# Patient Record
Sex: Female | Born: 1955
Health system: Southern US, Community
[De-identification: ages and names within clinical notes are randomized; demographics above are authoritative.]

## PROBLEM LIST (undated history)

## (undated) ENCOUNTER — Emergency Department (HOSPITAL_COMMUNITY): Discharge status: Against Medical Advice | Payer: Self-pay

## (undated) DIAGNOSIS — D638 Anemia in other chronic diseases classified elsewhere: Secondary | ICD-10-CM

## (undated) DIAGNOSIS — I10 Essential (primary) hypertension: Secondary | ICD-10-CM

## (undated) DIAGNOSIS — E119 Type 2 diabetes mellitus without complications: Secondary | ICD-10-CM

## (undated) DIAGNOSIS — E079 Disorder of thyroid, unspecified: Secondary | ICD-10-CM

## (undated) DIAGNOSIS — N2889 Other specified disorders of kidney and ureter: Secondary | ICD-10-CM

## (undated) DIAGNOSIS — R221 Localized swelling, mass and lump, neck: Secondary | ICD-10-CM

## (undated) HISTORY — PX: NO PAST SURGERIES: SHX2092

---

## 1999-09-24 ENCOUNTER — Inpatient Hospital Stay (HOSPITAL_COMMUNITY): Admission: EM | Admit: 1999-09-24 | Discharge: 1999-10-02 | Payer: Self-pay | Admitting: Emergency Medicine

## 1999-09-27 ENCOUNTER — Encounter: Payer: Self-pay | Admitting: Internal Medicine

## 1999-10-09 ENCOUNTER — Encounter: Admission: RE | Admit: 1999-10-09 | Discharge: 1999-10-09 | Payer: Self-pay | Admitting: Internal Medicine

## 2000-11-11 ENCOUNTER — Encounter: Payer: Self-pay | Admitting: Emergency Medicine

## 2000-11-11 ENCOUNTER — Emergency Department (HOSPITAL_COMMUNITY): Admission: EM | Admit: 2000-11-11 | Discharge: 2000-11-11 | Payer: Self-pay | Admitting: Emergency Medicine

## 2013-06-10 ENCOUNTER — Ambulatory Visit: Payer: Self-pay

## 2013-07-17 ENCOUNTER — Ambulatory Visit: Payer: Self-pay | Attending: Internal Medicine | Admitting: Internal Medicine

## 2013-07-17 ENCOUNTER — Encounter: Payer: Self-pay | Admitting: Internal Medicine

## 2013-07-17 VITALS — BP 111/88 | HR 70 | Temp 98.5°F | Resp 16 | Ht 65.0 in | Wt 247.0 lb

## 2013-07-17 DIAGNOSIS — F17200 Nicotine dependence, unspecified, uncomplicated: Secondary | ICD-10-CM

## 2013-07-17 DIAGNOSIS — E119 Type 2 diabetes mellitus without complications: Secondary | ICD-10-CM

## 2013-07-17 DIAGNOSIS — K029 Dental caries, unspecified: Secondary | ICD-10-CM

## 2013-07-17 DIAGNOSIS — Z139 Encounter for screening, unspecified: Secondary | ICD-10-CM

## 2013-07-17 LAB — POCT GLYCOSYLATED HEMOGLOBIN (HGB A1C): Hemoglobin A1C: 6.7

## 2013-07-17 LAB — GLUCOSE, POCT (MANUAL RESULT ENTRY): POC Glucose: 151 mg/dl — AB (ref 70–99)

## 2013-07-17 MED ORDER — NICOTINE 21 MG/24HR TD PT24: 21.0000 mg | MEDICATED_PATCH | Freq: Every day | TRANSDERMAL | Status: DC

## 2013-07-17 MED ORDER — FREESTYLE SYSTEM KIT: 1.0000 | PACK | Freq: Three times a day (TID) | Status: DC

## 2013-07-17 MED ORDER — METFORMIN HCL ER 500 MG PO TB24: 500.0000 mg | ORAL_TABLET | Freq: Every day | ORAL | Status: DC

## 2013-07-17 NOTE — Progress Notes (Signed)
MRN: [id number] Name: Ashley Ramsey  Sex: female Age: 57 y.o. DOB: Dec 21, 1955  Allergies: Review of patient's allergies indicates no known allergies.  Chief Complaint  Patient presents with  . Establish Care    HPI: Patient is 57 y.o. female who comes for the first time to establish medical care, she denies any acute symptoms, as per patient she has a strong family history of diabetes and would like to be checked, hemoglobin A1c in office today is 6.7%. She also smoked cigarettes advised to quit smoking she is agreeable to try nicotine patch, patient also mentioned that she was taking blood pressure medication in the past today her blood pressure is 111/88.  History reviewed. No pertinent past medical history.  History reviewed. No pertinent past surgical history.    Medication List       This list is accurate as of: 07/17/13 12:15 PM.  Always use your most recent med list.               metFORMIN 500 MG 24 hr tablet  Commonly known as:  GLUCOPHAGE XR  Take 1 tablet (500 mg total) by mouth daily with breakfast.     nicotine 21 mg/24hr patch  Commonly known as:  NICODERM CQ - dosed in mg/24 hours  Place 1 patch (21 mg total) onto the skin daily.        Meds ordered this encounter  Medications  . metFORMIN (GLUCOPHAGE XR) 500 MG 24 hr tablet    Sig: Take 1 tablet (500 mg total) by mouth daily with breakfast.    Dispense:  30 tablet    Refill:  3  . nicotine (NICODERM CQ - DOSED IN MG/24 HOURS) 21 mg/24hr patch    Sig: Place 1 patch (21 mg total) onto the skin daily.    Dispense:  28 patch    Refill:  0     There is no immunization history on file for this patient.  History  Substance Use Topics  . Smoking status: Current Every Day Smoker -- 0.50 packs/day for 30 years  . Smokeless tobacco: Not on file     Comment: 8 cigarettes a day  . Alcohol Use: Yes     Comment: once in a while     Review of Systems  As noted in HPI  Filed Vitals:   07/17/13 1154   BP: 111/88  Pulse: 70  Temp: 98.5 F (36.9 C)  Resp: 16    Physical Exam  Physical Exam  Constitutional:  Obese female sitting comfortably not in acute distress  HENT:  Dental cavities  Eyes: EOM are normal. Pupils are equal, round, and reactive to light.  Cardiovascular: Normal rate and regular rhythm.   Pulmonary/Chest: Breath sounds normal. No respiratory distress. She has no wheezes. She has no rales.    CBC No results found for this basename: wbc, rbc, hgb, hct, plt, mcv, neutrabs, lymphsabs, monoabs, eosabs, basosabs    CMP  No results found for this basename: na, k, cl, co2, glucose, bun, creatinine, calcium, prot, albumin, ast, alt, alkphos, bilitot, gfrnonaa, gfraa    No results found for this basename: chol, tri, ldl    No components found with this basename: hga1c    No results found for this basename: AST    Assessment and Plan  DM (diabetes mellitus) - Plan: Glucose (CBG), HgB A1c 6.7%, advised her diet and exercise also started on metFORMIN (GLUCOPHAGE XR) 500 MG 24 hr tablet once daily   Smoking -  Plan: nicotine (NICODERM CQ - DOSED IN MG/24 HOURS) 21 mg/24hr patch  Dental cavities - Plan: Ambulatory referral to Dentistry  Screening - Plan: Lipid panel, TSH, Vit D  25 hydroxy (rtn osteoporosis monitoring), CBC with Differential, COMPLETE METABOLIC PANEL WITH GFR   Return in about 6 weeks (around 08/28/2013).  Doris Cheadle, MD

## 2013-07-17 NOTE — Progress Notes (Signed)
Patient here to establish care There is a history of DM in the family Currently takes no medication

## 2013-07-17 NOTE — Patient Instructions (Signed)

## 2013-08-17 ENCOUNTER — Ambulatory Visit: Payer: Self-pay | Attending: Internal Medicine

## 2013-08-17 DIAGNOSIS — Z139 Encounter for screening, unspecified: Secondary | ICD-10-CM

## 2013-08-17 LAB — CBC WITH DIFFERENTIAL/PLATELET
BASOS ABS: 0 10*3/uL (ref 0.0–0.1)
BASOS PCT: 0 % (ref 0–1)
EOS ABS: 0.2 10*3/uL (ref 0.0–0.7)
Eosinophils Relative: 2 % (ref 0–5)
HCT: 38 % (ref 36.0–46.0)
Hemoglobin: 12.8 g/dL (ref 12.0–15.0)
Lymphocytes Relative: 34 % (ref 12–46)
Lymphs Abs: 2.9 10*3/uL (ref 0.7–4.0)
MCH: 25.9 pg — AB (ref 26.0–34.0)
MCHC: 33.7 g/dL (ref 30.0–36.0)
MCV: 76.8 fL — ABNORMAL LOW (ref 78.0–100.0)
Monocytes Absolute: 0.5 10*3/uL (ref 0.1–1.0)
Monocytes Relative: 6 % (ref 3–12)
NEUTROS ABS: 4.8 10*3/uL (ref 1.7–7.7)
NEUTROS PCT: 58 % (ref 43–77)
Platelets: 228 10*3/uL (ref 150–400)
RBC: 4.95 MIL/uL (ref 3.87–5.11)
RDW: 15.8 % — AB (ref 11.5–15.5)
WBC: 8.4 10*3/uL (ref 4.0–10.5)

## 2013-08-17 LAB — COMPLETE METABOLIC PANEL WITH GFR
ALBUMIN: 3.8 g/dL (ref 3.5–5.2)
ALK PHOS: 108 U/L (ref 39–117)
ALT: 11 U/L (ref 0–35)
AST: 12 U/L (ref 0–37)
BILIRUBIN TOTAL: 0.4 mg/dL (ref 0.3–1.2)
BUN: 13 mg/dL (ref 6–23)
CO2: 25 mEq/L (ref 19–32)
Calcium: 9.2 mg/dL (ref 8.4–10.5)
Chloride: 105 mEq/L (ref 96–112)
Creat: 0.78 mg/dL (ref 0.50–1.10)
GFR, EST NON AFRICAN AMERICAN: 85 mL/min
GFR, Est African American: >89 mL/min
GLUCOSE: 90 mg/dL (ref 70–99)
POTASSIUM: 3.8 meq/L (ref 3.5–5.3)
Sodium: 139 mEq/L (ref 135–145)
Total Protein: 6.8 g/dL (ref 6.0–8.3)

## 2013-08-17 LAB — LIPID PANEL
CHOL/HDL RATIO: 4 ratio
Cholesterol: 206 mg/dL — ABNORMAL HIGH (ref 0–200)
HDL: 52 mg/dL (ref >39)
LDL Cholesterol: 126 mg/dL — ABNORMAL HIGH (ref 0–99)
Triglycerides: 139 mg/dL (ref <150)
VLDL: 28 mg/dL (ref 0–40)

## 2013-08-17 LAB — TSH: TSH: 0.17 u[IU]/mL — ABNORMAL LOW (ref 0.350–4.500)

## 2013-08-18 ENCOUNTER — Other Ambulatory Visit: Payer: Self-pay | Admitting: Internal Medicine

## 2013-08-18 ENCOUNTER — Telehealth: Payer: Self-pay

## 2013-08-18 DIAGNOSIS — E119 Type 2 diabetes mellitus without complications: Secondary | ICD-10-CM

## 2013-08-18 DIAGNOSIS — R7989 Other specified abnormal findings of blood chemistry: Secondary | ICD-10-CM

## 2013-08-18 LAB — VITAMIN D 25 HYDROXY (VIT D DEFICIENCY, FRACTURES): VIT D 25 HYDROXY: 16 ng/mL — AB (ref 30–89)

## 2013-08-18 MED ORDER — METFORMIN HCL ER 500 MG PO TB24: 500.0000 mg | ORAL_TABLET | Freq: Every day | ORAL | Status: DC

## 2013-08-18 MED ORDER — VITAMIN D (ERGOCALCIFEROL) 1.25 MG (50000 UNIT) PO CAPS: 50000.0000 [IU] | ORAL_CAPSULE | ORAL | Status: DC

## 2013-08-18 NOTE — Telephone Encounter (Signed)
Patient is aware of her lab results Prescription for vitamin d sent to pharmacy on file Also refilled metformin

## 2013-08-18 NOTE — Telephone Encounter (Signed)
Message copied by Dorothe Pea on Tue Aug 18, 2013 12:13 PM ------      Message from: Lorayne Marek      Created: Tue Aug 18, 2013 11:43 AM       Blood work reviewed, noticed low vitamin D, call patient advise to start ergocalciferol 50,000 units once a week for the duration of  12 weeks.      , noticed abnormal TSH level , I have ordered full TFT panel , call and advise patient to do the blood work prior to the next visit.       ------

## 2013-08-21 ENCOUNTER — Ambulatory Visit: Payer: Self-pay | Attending: Internal Medicine | Admitting: Internal Medicine

## 2013-08-21 ENCOUNTER — Encounter: Payer: Self-pay | Admitting: Internal Medicine

## 2013-08-21 VITALS — BP 180/90 | HR 82 | Temp 98.3°F | Resp 16 | Wt 236.2 lb

## 2013-08-21 DIAGNOSIS — E559 Vitamin D deficiency, unspecified: Secondary | ICD-10-CM

## 2013-08-21 DIAGNOSIS — R7989 Other specified abnormal findings of blood chemistry: Secondary | ICD-10-CM

## 2013-08-21 DIAGNOSIS — E119 Type 2 diabetes mellitus without complications: Secondary | ICD-10-CM | POA: Insufficient documentation

## 2013-08-21 DIAGNOSIS — IMO0001 Reserved for inherently not codable concepts without codable children: Secondary | ICD-10-CM | POA: Insufficient documentation

## 2013-08-21 DIAGNOSIS — I10 Essential (primary) hypertension: Secondary | ICD-10-CM | POA: Insufficient documentation

## 2013-08-21 DIAGNOSIS — F17200 Nicotine dependence, unspecified, uncomplicated: Secondary | ICD-10-CM | POA: Insufficient documentation

## 2013-08-21 DIAGNOSIS — R6889 Other general symptoms and signs: Secondary | ICD-10-CM

## 2013-08-21 MED ORDER — LISINOPRIL-HYDROCHLOROTHIAZIDE 10-12.5 MG PO TABS: 1.0000 | ORAL_TABLET | Freq: Every day | ORAL | Status: DC

## 2013-08-21 NOTE — Progress Notes (Signed)
Patient here for follow up DM Today presents with elevated blood pressure

## 2013-08-21 NOTE — Patient Instructions (Signed)
2 Gram Low Sodium Diet A 2 gram sodium diet restricts the amount of sodium in the diet to no more than 2 g or 2000 mg daily. Limiting the amount of sodium is often used to help lower blood pressure. It is important if you have heart, liver, or kidney problems. Many foods contain sodium for flavor and sometimes as a preservative. When the amount of sodium in a diet needs to be low, it is important to know what to look for when choosing foods and drinks. The following includes some information and guidelines to help make it easier for you to adapt to a low sodium diet. QUICK TIPS  Do not add salt to food.  Avoid convenience items and fast food.  Choose unsalted snack foods.  Buy lower sodium products, often labeled as "lower sodium" or "no salt added."  Check food labels to learn how much sodium is in 1 serving.  When eating at a restaurant, ask that your food be prepared with less salt or none, if possible. READING FOOD LABELS FOR SODIUM INFORMATION The nutrition facts label is a good place to find how much sodium is in foods. Look for products with no more than 500 to 600 mg of sodium per meal and no more than 150 mg per serving. Remember that 2 g = 2000 mg. The food label may also list foods as:  Sodium-free: Less than 5 mg in a serving.  Very low sodium: 35 mg or less in a serving.  Low-sodium: 140 mg or less in a serving.  Light in sodium: 50% less sodium in a serving. For example, if a food that usually has 300 mg of sodium is changed to become light in sodium, it will have 150 mg of sodium.  Reduced sodium: 25% less sodium in a serving. For example, if a food that usually has 400 mg of sodium is changed to reduced sodium, it will have 300 mg of sodium. CHOOSING FOODS Grains  Avoid: Salted crackers and snack items. Some cereals, including instant hot cereals. Bread stuffing and biscuit mixes. Seasoned rice or pasta mixes.  Choose: Unsalted snack items. Low-sodium cereals, oats,  puffed wheat and rice, shredded wheat. English muffins and bread. Pasta. Meats  Avoid: Salted, canned, smoked, spiced, pickled meats, including fish and poultry. Bacon, ham, sausage, cold cuts, hot dogs, anchovies.  Choose: Low-sodium canned tuna and salmon. Fresh or frozen meat, poultry, and fish. Dairy  Avoid: Processed cheese and spreads. Cottage cheese. Buttermilk and condensed milk. Regular cheese.  Choose: Milk. Low-sodium cottage cheese. Yogurt. Sour cream. Low-sodium cheese. Fruits and Vegetables  Avoid: Regular canned vegetables. Regular canned tomato sauce and paste. Frozen vegetables in sauces. Olives. Pickles. Relishes. Sauerkraut.  Choose: Low-sodium canned vegetables. Low-sodium tomato sauce and paste. Frozen or fresh vegetables. Fresh and frozen fruit. Condiments  Avoid: Canned and packaged gravies. Worcestershire sauce. Tartar sauce. Barbecue sauce. Soy sauce. Steak sauce. Ketchup. Onion, garlic, and table salt. Meat flavorings and tenderizers.  Choose: Fresh and dried herbs and spices. Low-sodium varieties of mustard and ketchup. Lemon juice. Tabasco sauce. Horseradish. SAMPLE 2 GRAM SODIUM MEAL PLAN Breakfast / Sodium (mg)  1 cup low-fat milk / 143 mg  2 slices whole-wheat toast / 270 mg  1 tbs heart-healthy margarine / 153 mg  1 hard-boiled egg / 139 mg  1 small orange / 0 mg Lunch / Sodium (mg)  1 cup raw carrots / 76 mg   cup hummus / 298 mg  1 cup low-fat milk /   143 mg   cup red grapes / 2 mg  1 whole-wheat pita bread / 356 mg Dinner / Sodium (mg)  1 cup whole-wheat pasta / 2 mg  1 cup low-sodium tomato sauce / 73 mg  3 oz lean ground beef / 57 mg  1 small side salad (1 cup raw spinach leaves,  cup cucumber,  cup yellow bell pepper) with 1 tsp olive oil and 1 tsp red wine vinegar / 25 mg Snack / Sodium (mg)  1 container low-fat vanilla yogurt / 107 mg  3 graham cracker squares / 127 mg Nutrient Analysis  Calories: 2033  Protein:  77 g  Carbohydrate: 282 g  Fat: 72 g  Sodium: 1971 mg Document Released: 07/30/2005 Document Revised: 10/22/2011 Document Reviewed: 10/31/2009 Exit[employer] Patient Information 2014 [employer].  

## 2013-08-21 NOTE — Progress Notes (Signed)
MRN: [id number] Name: Ashley Ramsey  Sex: female Age: 58 y.o. DOB: [birth year]  Allergies: Review of patient's allergies indicates no known allergies.  Chief Complaint  Patient presents with  . Follow-up    HPI: Patient is 58 y.o. female who comes today for followup, history of diabetes she is taking metformin 500 mg daily denies any hypoglycemic symptoms, today her blood pressure is elevated, as per patient she used to take her blood pressure medication in the past today her repeat blood pressure is 180/90, she denies any headache dizziness chest pain shortness of breath, blood work reviewed she also has vitamin D deficiency, is going to start medication, she is to smoke cigarettes trying to quit already has nicotine patch.  Past Medical History  Diagnosis Date  . Diabetes mellitus without complication     History reviewed. No pertinent past surgical history.    Medication List       This list is accurate as of: 08/21/13 10:14 AM.  Always use your most recent med list.               glucose monitoring kit monitoring kit  1 each by Does not apply route 3 (three) times daily before meals.     lisinopril-hydrochlorothiazide 10-12.5 MG per tablet  Commonly known as:  PRINZIDE,ZESTORETIC  Take 1 tablet by mouth daily.     metFORMIN 500 MG 24 hr tablet  Commonly known as:  GLUCOPHAGE XR  Take 1 tablet (500 mg total) by mouth daily with breakfast.     nicotine 21 mg/24hr patch  Commonly known as:  NICODERM CQ - dosed in mg/24 hours  Place 1 patch (21 mg total) onto the skin daily.     Vitamin D (Ergocalciferol) 50000 UNITS Caps capsule  Commonly known as:  DRISDOL  Take 1 capsule (50,000 Units total) by mouth every 7 (seven) days.        Meds ordered this encounter  Medications  . lisinopril-hydrochlorothiazide (PRINZIDE,ZESTORETIC) 10-12.5 MG per tablet    Sig: Take 1 tablet by mouth daily.    Dispense:  90 tablet    Refill:  3     There is no immunization history  on file for this patient.  Family History  Problem Relation Age of Onset  . Diabetes Mother   . Hypertension Mother   . Heart disease Mother   . Diabetes Sister   . Hypertension Sister   . Cancer Daughter     History  Substance Use Topics  . Smoking status: Current Every Day Smoker -- 0.50 packs/day for 30 years  . Smokeless tobacco: Not on file     Comment: 8 cigarettes a day  . Alcohol Use: Yes     Comment: once in a while     Review of Systems  As noted in HPI  Filed Vitals:   08/21/13 1006  BP: 180/90  Pulse:   Temp:   Resp:     Physical Exam  Physical Exam  Constitutional: No distress.  Eyes: EOM are normal. Pupils are equal, round, and reactive to light.  Cardiovascular: Normal rate and regular rhythm.   Pulmonary/Chest: Breath sounds normal. No respiratory distress. She has no wheezes. She has no rales.  Musculoskeletal: She exhibits no edema.  Neurological: She has normal reflexes.    CBC    Component Value Date/Time   WBC 8.4 08/17/2013 0929   RBC 4.95 08/17/2013 0929   HGB 12.8 08/17/2013 0929   HCT 38.0 08/17/2013 0929  PLT 228 08/17/2013 0929   MCV 76.8* 08/17/2013 0929   LYMPHSABS 2.9 08/17/2013 0929   MONOABS 0.5 08/17/2013 0929   EOSABS 0.2 08/17/2013 0929   BASOSABS 0.0 08/17/2013 0929    CMP     Component Value Date/Time   NA 139 08/17/2013 0929   K 3.8 08/17/2013 0929   CL 105 08/17/2013 0929   CO2 25 08/17/2013 0929   GLUCOSE 90 08/17/2013 0929   BUN 13 08/17/2013 0929   CREATININE 0.78 08/17/2013 0929   CALCIUM 9.2 08/17/2013 0929   PROT 6.8 08/17/2013 0929   ALBUMIN 3.8 08/17/2013 0929   AST 12 08/17/2013 0929   ALT 11 08/17/2013 0929   ALKPHOS 108 08/17/2013 0929   BILITOT 0.4 08/17/2013 0929    Lab Results  Component Value Date/Time   CHOL 206* 08/17/2013  9:29 AM    No components found with this basename: hga1c    Lab Results  Component Value Date/Time   AST 12 08/17/2013  9:29 AM    Assessment and Plan  Essential hypertension, benign - Plan: Advised  patient follow low salt diet started on lisinopril-hydrochlorothiazide (PRINZIDE,ZESTORETIC) 10-12.5 MG per tablet daily will check BP in 2 weeks. Advised patient to get immediate medical attention if she has any symptoms of headache dizziness chest pain or shortness of breath, she understands verbalized instructions.  Abnormal TSH TFT panel is already ordered, we'll do blood work today  Unspecified vitamin D deficiency Continue with ergocalciferol 50,000 units once a week  Smoking Trying to quit smoking.  Diabetes Continue with metformin will check hemoglobin A1c on the next visit, continue with fingerstick monitoring.  Return in about 2 months (around 10/19/2013) for  BP check in 2 weeks.  Lorayne Marek, MD

## 2013-08-28 ENCOUNTER — Ambulatory Visit: Payer: No Typology Code available for payment source | Attending: Internal Medicine

## 2013-08-28 DIAGNOSIS — R7989 Other specified abnormal findings of blood chemistry: Secondary | ICD-10-CM

## 2013-08-29 LAB — TSH: TSH: 0.273 u[IU]/mL — AB (ref 0.350–4.500)

## 2013-08-29 LAB — T3, FREE: T3, Free: 3 pg/mL (ref 2.3–4.2)

## 2013-08-29 LAB — T4, FREE: Free T4: 1.15 ng/dL (ref 0.80–1.80)

## 2013-08-31 ENCOUNTER — Telehealth: Payer: Self-pay

## 2013-08-31 NOTE — Telephone Encounter (Signed)
Patient is aware of her lab results Will repeat lab work in two months

## 2013-08-31 NOTE — Telephone Encounter (Signed)
Message copied by Dorothe Pea on Mon Aug 31, 2013 12:07 PM ------      Message from: Lorayne Marek      Created: Mon Aug 31, 2013 10:12 AM       Blood work reviewed T3 and free T4 level are normal, still has abnormal TSH level, repeat in 2 months if it is  still abnormal consider referral to endocrinology. ------

## 2013-09-11 ENCOUNTER — Ambulatory Visit: Payer: No Typology Code available for payment source | Attending: Internal Medicine

## 2013-09-11 NOTE — Progress Notes (Unsigned)
   Subjective:    Patient ID: Ashley Ramsey, female    DOB: 1956/01/09, 58 y.o.   MRN: [id number]  HPI    Review of Systems     Objective:   Physical Exam        Assessment & Plan:  Pt comes in today for blood recheck s/p taking Lisinopril-HCTZ 10-12.5 mg Taking medications daily BP- 145/83 84

## 2013-09-11 NOTE — Patient Instructions (Signed)
Pt instructed to continue monitoring BP at home and watch food high in Sodium.

## 2014-05-14 ENCOUNTER — Other Ambulatory Visit: Payer: Self-pay | Admitting: Internal Medicine

## 2014-05-14 ENCOUNTER — Ambulatory Visit: Payer: Self-pay | Attending: Internal Medicine

## 2014-05-21 ENCOUNTER — Ambulatory Visit: Payer: Self-pay | Admitting: Internal Medicine

## 2014-08-02 ENCOUNTER — Other Ambulatory Visit: Payer: Self-pay | Admitting: Internal Medicine

## 2014-08-23 ENCOUNTER — Other Ambulatory Visit: Payer: Self-pay | Admitting: Internal Medicine

## 2015-08-29 ENCOUNTER — Emergency Department (HOSPITAL_COMMUNITY): Payer: Medicaid Other

## 2015-08-29 ENCOUNTER — Encounter (HOSPITAL_COMMUNITY): Payer: Self-pay | Admitting: Emergency Medicine

## 2015-08-29 ENCOUNTER — Inpatient Hospital Stay (HOSPITAL_COMMUNITY)
Admission: EM | Admit: 2015-08-29 | Discharge: 2015-09-13 | DRG: 011 | Disposition: A | Discharge status: Home Health | Payer: Medicaid Other | Attending: Internal Medicine | Admitting: Internal Medicine

## 2015-08-29 DIAGNOSIS — N179 Acute kidney failure, unspecified: Secondary | ICD-10-CM | POA: Diagnosis present

## 2015-08-29 DIAGNOSIS — N2889 Other specified disorders of kidney and ureter: Secondary | ICD-10-CM | POA: Diagnosis present

## 2015-08-29 DIAGNOSIS — I2510 Atherosclerotic heart disease of native coronary artery without angina pectoris: Secondary | ICD-10-CM | POA: Diagnosis present

## 2015-08-29 DIAGNOSIS — Z452 Encounter for adjustment and management of vascular access device: Secondary | ICD-10-CM

## 2015-08-29 DIAGNOSIS — Z7984 Long term (current) use of oral hypoglycemic drugs: Secondary | ICD-10-CM

## 2015-08-29 DIAGNOSIS — K264 Chronic or unspecified duodenal ulcer with hemorrhage: Secondary | ICD-10-CM | POA: Diagnosis not present

## 2015-08-29 DIAGNOSIS — D62 Acute posthemorrhagic anemia: Secondary | ICD-10-CM | POA: Diagnosis present

## 2015-08-29 DIAGNOSIS — E46 Unspecified protein-calorie malnutrition: Secondary | ICD-10-CM | POA: Diagnosis present

## 2015-08-29 DIAGNOSIS — C7900 Secondary malignant neoplasm of unspecified kidney and renal pelvis: Secondary | ICD-10-CM | POA: Diagnosis present

## 2015-08-29 DIAGNOSIS — E119 Type 2 diabetes mellitus without complications: Secondary | ICD-10-CM | POA: Diagnosis present

## 2015-08-29 DIAGNOSIS — E079 Disorder of thyroid, unspecified: Secondary | ICD-10-CM | POA: Diagnosis present

## 2015-08-29 DIAGNOSIS — C329 Malignant neoplasm of larynx, unspecified: Secondary | ICD-10-CM | POA: Insufficient documentation

## 2015-08-29 DIAGNOSIS — R1310 Dysphagia, unspecified: Secondary | ICD-10-CM | POA: Insufficient documentation

## 2015-08-29 DIAGNOSIS — Z87891 Personal history of nicotine dependence: Secondary | ICD-10-CM

## 2015-08-29 DIAGNOSIS — R918 Other nonspecific abnormal finding of lung field: Secondary | ICD-10-CM | POA: Diagnosis present

## 2015-08-29 DIAGNOSIS — K922 Gastrointestinal hemorrhage, unspecified: Secondary | ICD-10-CM | POA: Insufficient documentation

## 2015-08-29 DIAGNOSIS — E876 Hypokalemia: Secondary | ICD-10-CM | POA: Diagnosis present

## 2015-08-29 DIAGNOSIS — E870 Hyperosmolality and hypernatremia: Secondary | ICD-10-CM | POA: Diagnosis not present

## 2015-08-29 DIAGNOSIS — Z8249 Family history of ischemic heart disease and other diseases of the circulatory system: Secondary | ICD-10-CM

## 2015-08-29 DIAGNOSIS — Z809 Family history of malignant neoplasm, unspecified: Secondary | ICD-10-CM

## 2015-08-29 DIAGNOSIS — C321 Malignant neoplasm of supraglottis: Principal | ICD-10-CM | POA: Diagnosis present

## 2015-08-29 DIAGNOSIS — Z6841 Body Mass Index (BMI) 40.0 and over, adult: Secondary | ICD-10-CM

## 2015-08-29 DIAGNOSIS — Z79899 Other long term (current) drug therapy: Secondary | ICD-10-CM

## 2015-08-29 DIAGNOSIS — D638 Anemia in other chronic diseases classified elsewhere: Secondary | ICD-10-CM | POA: Diagnosis present

## 2015-08-29 DIAGNOSIS — Z515 Encounter for palliative care: Secondary | ICD-10-CM | POA: Insufficient documentation

## 2015-08-29 DIAGNOSIS — B9620 Unspecified Escherichia coli [E. coli] as the cause of diseases classified elsewhere: Secondary | ICD-10-CM | POA: Diagnosis present

## 2015-08-29 DIAGNOSIS — N12 Tubulo-interstitial nephritis, not specified as acute or chronic: Secondary | ICD-10-CM | POA: Diagnosis present

## 2015-08-29 DIAGNOSIS — F17200 Nicotine dependence, unspecified, uncomplicated: Secondary | ICD-10-CM

## 2015-08-29 DIAGNOSIS — I10 Essential (primary) hypertension: Secondary | ICD-10-CM | POA: Diagnosis present

## 2015-08-29 DIAGNOSIS — R509 Fever, unspecified: Secondary | ICD-10-CM

## 2015-08-29 DIAGNOSIS — R1313 Dysphagia, pharyngeal phase: Secondary | ICD-10-CM | POA: Diagnosis present

## 2015-08-29 DIAGNOSIS — R0602 Shortness of breath: Secondary | ICD-10-CM

## 2015-08-29 DIAGNOSIS — R221 Localized swelling, mass and lump, neck: Secondary | ICD-10-CM | POA: Diagnosis present

## 2015-08-29 DIAGNOSIS — D500 Iron deficiency anemia secondary to blood loss (chronic): Secondary | ICD-10-CM | POA: Diagnosis present

## 2015-08-29 DIAGNOSIS — F1010 Alcohol abuse, uncomplicated: Secondary | ICD-10-CM | POA: Diagnosis present

## 2015-08-29 HISTORY — DX: Other specified disorders of kidney and ureter: N28.89

## 2015-08-29 HISTORY — DX: Type 2 diabetes mellitus without complications: E11.9

## 2015-08-29 HISTORY — DX: Disorder of thyroid, unspecified: E07.9

## 2015-08-29 HISTORY — DX: Essential (primary) hypertension: I10

## 2015-08-29 HISTORY — DX: Localized swelling, mass and lump, neck: R22.1

## 2015-08-29 HISTORY — DX: Anemia in other chronic diseases classified elsewhere: D63.8

## 2015-08-29 LAB — CBC
HEMATOCRIT: 34.8 % — AB (ref 36.0–46.0)
HEMOGLOBIN: 10.9 g/dL — AB (ref 12.0–15.0)
MCH: 25.6 pg — AB (ref 26.0–34.0)
MCHC: 31.3 g/dL (ref 30.0–36.0)
MCV: 81.9 fL (ref 78.0–100.0)
PLATELETS: 287 10*3/uL (ref 150–400)
RBC: 4.25 MIL/uL (ref 3.87–5.11)
RDW: 13.5 % (ref 11.5–15.5)
WBC: 24.8 10*3/uL — AB (ref 4.0–10.5)

## 2015-08-29 LAB — URINE MICROSCOPIC-ADD ON

## 2015-08-29 LAB — URINALYSIS, ROUTINE W REFLEX MICROSCOPIC
GLUCOSE, UA: NEGATIVE mg/dL
Ketones, ur: 15 mg/dL — AB
Nitrite: NEGATIVE
PROTEIN: NEGATIVE mg/dL
SPECIFIC GRAVITY, URINE: 1.025 (ref 1.005–1.030)
pH: 5 (ref 5.0–8.0)

## 2015-08-29 LAB — COMPREHENSIVE METABOLIC PANEL
ALT: 13 U/L — AB (ref 14–54)
ANION GAP: 15 (ref 5–15)
AST: 13 U/L — ABNORMAL LOW (ref 15–41)
Albumin: 2.9 g/dL — ABNORMAL LOW (ref 3.5–5.0)
Alkaline Phosphatase: 88 U/L (ref 38–126)
BUN: 53 mg/dL — ABNORMAL HIGH (ref 6–20)
CHLORIDE: 96 mmol/L — AB (ref 101–111)
CO2: 27 mmol/L (ref 22–32)
Calcium: 9.1 mg/dL (ref 8.9–10.3)
Creatinine, Ser: 1.47 mg/dL — ABNORMAL HIGH (ref 0.44–1.00)
GFR, EST AFRICAN AMERICAN: 44 mL/min — AB (ref >60)
GFR, EST NON AFRICAN AMERICAN: 38 mL/min — AB (ref >60)
Glucose, Bld: 166 mg/dL — ABNORMAL HIGH (ref 65–99)
POTASSIUM: 3.1 mmol/L — AB (ref 3.5–5.1)
SODIUM: 138 mmol/L (ref 135–145)
Total Bilirubin: 0.4 mg/dL (ref 0.3–1.2)
Total Protein: 7.3 g/dL (ref 6.5–8.1)

## 2015-08-29 LAB — LIPASE, BLOOD: LIPASE: 22 U/L (ref 11–51)

## 2015-08-29 LAB — SODIUM, URINE, RANDOM: SODIUM UR: 32 mmol/L

## 2015-08-29 LAB — CREATININE, URINE, RANDOM: CREATININE, URINE: 164.04 mg/dL

## 2015-08-29 MED ORDER — SODIUM CHLORIDE 0.9 % IV BOLUS (SEPSIS)
500.0000 mL | Freq: Once | INTRAVENOUS | Status: AC
  Administered 2015-08-29: 500 mL via INTRAVENOUS

## 2015-08-29 MED ORDER — METFORMIN HCL ER 500 MG PO TB24: 500.0000 mg | ORAL_TABLET | Freq: Every day | ORAL | Status: DC

## 2015-08-29 MED ORDER — INFLUENZA VAC SPLIT QUAD 0.5 ML IM SUSY
0.5000 mL | PREFILLED_SYRINGE | INTRAMUSCULAR | Status: DC
  Filled 2015-08-29 (×2): qty 0.5

## 2015-08-29 MED ORDER — MORPHINE SULFATE (PF) 4 MG/ML IV SOLN
4.0000 mg | Freq: Once | INTRAVENOUS | Status: AC
  Administered 2015-08-29: 4 mg via INTRAVENOUS
  Filled 2015-08-29: qty 1

## 2015-08-29 MED ORDER — ONDANSETRON HCL 4 MG/2ML IJ SOLN
4.0000 mg | Freq: Four times a day (QID) | INTRAMUSCULAR | Status: DC | PRN
  Administered 2015-09-05 – 2015-09-06 (×2): 4 mg via INTRAVENOUS
  Filled 2015-08-29 (×3): qty 2

## 2015-08-29 MED ORDER — ONDANSETRON HCL 4 MG PO TABS: 4.0000 mg | ORAL_TABLET | Freq: Four times a day (QID) | ORAL | Status: DC | PRN

## 2015-08-29 MED ORDER — PNEUMOCOCCAL VAC POLYVALENT 25 MCG/0.5ML IJ INJ
0.5000 mL | INJECTION | INTRAMUSCULAR | Status: AC
Start: 2015-08-30 — End: 2015-08-30
  Administered 2015-08-30: 0.5 mL via INTRAMUSCULAR
  Filled 2015-08-29: qty 0.5

## 2015-08-29 MED ORDER — ACETAMINOPHEN 325 MG PO TABS: 650.0000 mg | ORAL_TABLET | Freq: Four times a day (QID) | ORAL | Status: DC | PRN

## 2015-08-29 MED ORDER — HYDROCODONE-ACETAMINOPHEN 5-325 MG PO TABS
1.0000 | ORAL_TABLET | ORAL | Status: DC | PRN
  Administered 2015-08-29 – 2015-08-31 (×4): 2 via ORAL
  Filled 2015-08-29 (×6): qty 2

## 2015-08-29 MED ORDER — IOHEXOL 300 MG/ML  SOLN
75.0000 mL | Freq: Once | INTRAMUSCULAR | Status: AC | PRN
  Administered 2015-08-29: 75 mL via INTRAVENOUS

## 2015-08-29 MED ORDER — SODIUM CHLORIDE 0.9 % IV SOLN
INTRAVENOUS | Status: DC
  Administered 2015-08-30 – 2015-08-31 (×2): via INTRAVENOUS

## 2015-08-29 MED ORDER — POTASSIUM CHLORIDE CRYS ER 20 MEQ PO TBCR
20.0000 meq | EXTENDED_RELEASE_TABLET | Freq: Two times a day (BID) | ORAL | Status: DC
  Administered 2015-08-29: 20 meq via ORAL
  Filled 2015-08-29: qty 1

## 2015-08-29 MED ORDER — DEXTROSE 5 % IV SOLN
1.0000 g | INTRAVENOUS | Status: DC
  Administered 2015-08-30 – 2015-09-06 (×9): 1 g via INTRAVENOUS
  Filled 2015-08-29 (×12): qty 10

## 2015-08-29 MED ORDER — SODIUM CHLORIDE 0.9 % IV BOLUS (SEPSIS)
1000.0000 mL | Freq: Once | INTRAVENOUS | Status: AC
  Administered 2015-08-29: 1000 mL via INTRAVENOUS

## 2015-08-29 MED ORDER — ACETAMINOPHEN 650 MG RE SUPP: 650.0000 mg | Freq: Four times a day (QID) | RECTAL | Status: DC | PRN

## 2015-08-29 NOTE — H&P (Signed)
History and Physical  Patient Name: Ashley Ramsey     LEX:[id number]    DOB: Dec 18, 1955    DOA: 08/29/2015 Referring physician: Davonna Belling, MD PCP: Lorayne Marek, MD      Chief Complaint: Hoarse voice and abdominal pain  HPI: Ashley Ramsey is a 60 y.o. female with a past medical history significant for HTN and NIDDM who presents with gradually progressive hoarseness and abdominal pain.  The patient was in her usual state of health until about a month ago when she first started to notice voice changes, difficulty swallowing, and abdominal discomforts.  These symptoms progressed over the month until she now notes difficulty swallowing meats (more than soft foods), nausea/vomiting with frequent spitting, weight loss, no appetite, and hoarse tired voice.  Her abdominal pain is diffuse, crampy and severe, and worsening so she came to the ER.  In the ED, she had leukocytosis, UTI, and CT soft tissue neck with a new 6 cm laryngeal mass and a CT abdomen and pelvis with a new 6 cm LEFT renal mass.  [facility] were called to evaluate for admission.     Review of Systems:  All other systems negative except as just noted or noted in the history of present illness.  No Known Allergies  Prior to Admission medications   Medication Sig Start Date End Date Taking? Authorizing Provider  ibuprofen (ADVIL,MOTRIN) 200 MG tablet Take 200 mg by mouth daily as needed for moderate pain.   Yes Historical Provider, MD  lisinopril-hydrochlorothiazide (PRINZIDE,ZESTORETIC) 10-12.5 MG per tablet Take 1 tablet by mouth daily. 08/21/13  Yes Lorayne Marek, MD  metFORMIN (GLUCOPHAGE XR) 500 MG 24 hr tablet Take 1 tablet (500 mg total) by mouth daily with breakfast. 08/18/13  Yes Lorayne Marek, MD  Vitamin D, Ergocalciferol, (DRISDOL) 50000 UNITS CAPS capsule Take 1 capsule (50,000 Units total) by mouth every 7 (seven) days. 08/18/13  Yes Lorayne Marek, MD  glucose monitoring kit (FREESTYLE) monitoring kit 1 each by Does not  apply route 3 (three) times daily before meals. 07/17/13   Lorayne Marek, MD  nicotine (NICODERM CQ - DOSED IN MG/24 HOURS) 21 mg/24hr patch Place 1 patch (21 mg total) onto the skin daily. Patient not taking: Reported on 08/29/2015 07/17/13   Lorayne Marek, MD    Past Medical History  Diagnosis Date  . Diabetes mellitus without complication ([place])   . Thyroid disease   . Hypertension     History reviewed. No pertinent past surgical history.  Family history: family history includes Cancer in her daughter; Diabetes in her mother and sister; Heart disease in her mother; Hypertension in her mother and sister.  Daughter has stomach cancer.  Family members have unspecified thyroid disease.    Social History: Patient lives with her family.  She is retired from housekeeping.  She does not use an assistive device to walk.  She is independent with all ADLs.  She is a former smoker.  She reports to nursing staff that she drinks liquor weekly.       Physical Exam: BP 126/68 mmHg  Pulse 116  Temp(Src) 98.2 F (36.8 C) (Oral)  Resp 16  Ht _0  (1.6 m)  Wt 113.399 kg (250 lb)  BMI 44.30 kg/m2  SpO2 94% General appearance: Obese adult female, alert and in no acute distress.   Eyes: Anicteric, conjunctiva pink, lids and lashes normal.     ENT: No nasal deformity, discharge, or epistaxis.  OP moist without lesions.  Poor dentition.  Voice hoarse and weak.  I do not note an asymmetry or obvious mass in the neck. Lymph: No cervical, supraclavicular lymphadenopathy but exam limited by habitus. Skin: Warm and dry.   No suspicious rashes or lesions. Cardiac: Tachycardic, nl S1-S2, no murmurs appreciated.  No LE edema.  Radial pulses 2+ and symmetric. Respiratory: Normal respiratory rate and rhythm.  Bilateral inspiratory wheezes, scattered. Abdomen: Abdomen soft without rigidity.  Mild diffuse TTP. No ascites, distension.  No masses appreciated MSK: No deformities or effusions. Neuro: Sensorium intact  and responding to questions, attention normal.  Speech is fluent but hoarse.  Moves all extremities equally and with normal coordination.    Psych: Behavior appropriate.  Affect normal.  No evidence of aural or visual hallucinations or delusions.       Labs on Admission:  The metabolic panel shows hypokalemia and elevated creatinine. Last baseline 2 years ago and Cr 0.8 mg/dL. The transaminases and bilirubin are normal. Albumin low. The complete blood count shows leukocytosis. Lipase normal. Mild normocytic anemia. The UA shows bacteria, WBC, and RBC.     Radiological Exams on Admission: Personally reviewed: Dg Chest 2 View 08/29/2015 Clear.   Ct Soft Tissue Neck W Contrast 08/29/2015  "IMPRESSION: 1. 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 2. Suspect metastatic nodal disease at level 3 on the right (small but asymmetric up to 9 mm nodes). Bilateral level 2 nodes are indeterminate and also measure up to 9 mm individually. No cystic or necrotic nodes in the neck. 3. See CT chest abdomen and pelvis from today reported separately. "   Ct Chest Abdomen Pelvis W Contrast 08/29/2015   "1. No acute findings in the abdomen or pelvis to account for the patient's symptoms. 2. However, there is a 5.9 x 6.1 x 6.3 cm heterogeneously enhancing mass in the lower pole of the left kidney highly concerning for renal cell carcinoma. At this time, the lesion appears likely to be encapsulated within Gerota's fascia (although it comes very close to the left psoas and quadratus lumborum musculature), does not involve the left renal vein, and does not appear to be associated with lymphadenopathy. Nonemergent Urologic consultation for surgical resection is strongly recommended in the near future. 3. 1.2 x 1.4 cm indeterminate nodule in the left adrenal gland. Attention on followup studies is recommended, as a  metastatic lesion is not excluded. 4. 4 mm subpleural nodules in the right middle lobe and left lower lobe. These are highly nonspecific, and favored to represent subpleural lymph nodes, but attention on followup studies is recommended to ensure stability. 5. Cholelithiasis without evidence of acute cholecystitis at this time. 6. Atherosclerosis, including left main and 3 vessel coronary artery disease. Please note that although the presence of coronary artery calcium documents the presence of coronary artery disease, the severity of this disease and any potential stenosis cannot be assessed on this non-gated CT examination. Assessment for potential risk factor modification, dietary therapy or pharmacologic therapy may be warranted, if clinically indicated. 7. There are calcifications of the aortic valve and mitral valve/annulus. Echocardiographic correlation for evaluation of potential valvular dysfunction may be warranted if clinically indicated. 8. Colonic diverticulosis without evidence of acute diverticulitis at this time. 9. Additional incidental findings, as above."  EKG: Independently reviewed. Sinus tachycardia with L axis and no STTW changes.      Assessment/Plan 1. Laryngeal mass:  This is new.  Unclear etiology.  Possible  local nodal involvement, per CT.  No airway compromise at this time.  Able to swallow soft foods. -Consult to ENT, appreciate cares   2. Renal mass:  This is new.  Also unclear etiology.  Separate primary cancer possible. -Consult to Urology, appreciate cares  3. UTI with systemic inflammation:  The patient has systemic inflammation, but no evidence of end organ failure at this time. -Check lactate -Ceftriaxone 1g daily -Add on urine culture  4. Hypokalemia:  -Replete -Check magnesium  5. AKI, possible:  Unclear baseline. -Check urine electrolytes -Fluid resuscitation and repeat BMP  6. NIDDM:  Stable.  -Restart metformin in 48 hours  7. History of  alcohol use: The patient reported to nursing that she drinks about 1/5th gallon of liquor per week.   -CIWA protocol   DVT PPx: SCDs given possible biopsy Diet: Regular as able Consultants: ENT and Urology Code Status: Full Family Communication: Daughter and son, present at bedside.  CODE STATUS confirmed.  All questions answered.  Medical decision making: What exists of the patient's previous chart was reviewed in depth and the case was discussed with Dr. Constance Holster from ENT and Dr. Alvino Chapel. Patient seen 9:30 PM on 08/29/2015.  Disposition Plan:  Admit to observation for neck mass and dehydration.  Monitor renal function (possible AKI).  ENT will evaluate patient tomorrow and make recommendations re: biopsy as inpatient vs outpatient.  Likely PET CT following.      [name] [facility] Pager [phone]

## 2015-08-29 NOTE — ED Notes (Signed)
Pt remains monitored by blood pressure, pulse ox, and 12 lead. Pt is noted to have visitors at bedside.

## 2015-08-29 NOTE — ED Notes (Signed)
MD at bedside. 

## 2015-08-29 NOTE — Progress Notes (Signed)
ANTIBIOTIC CONSULT NOTE - INITIAL  Pharmacy Consult for Rocephin Indication: UTI  No Known Allergies  Patient Measurements: Height: 5\' 3"  (160 cm) Weight: 250 lb (113.399 kg) IBW/kg (Calculated) : 52.4  Vital Signs: Temp: 98.6 F (37 C) (01/16 2207) Temp Source: Oral (01/16 2207) BP: 105/72 mmHg (01/16 2207) Pulse Rate: 117 (01/16 2207) Intake/Output from previous day:   Intake/Output from this shift:    Labs:  Recent Labs  08/29/15 1446  WBC 24.8*  HGB 10.9*  PLT 287  CREATININE 1.47*   Estimated Creatinine Clearance: 50 mL/min (by C-G formula based on Cr of 1.47). No results for input(s): VANCOTROUGH, VANCOPEAK, VANCORANDOM, GENTTROUGH, GENTPEAK, GENTRANDOM, TOBRATROUGH, TOBRAPEAK, TOBRARND, AMIKACINPEAK, AMIKACINTROU, AMIKACIN in the last 72 hours.   Microbiology: No results found for this or any previous visit (from the past 720 hour(s)).  Medical History: Past Medical History  Diagnosis Date  . Diabetes mellitus without complication ([employer])   . Thyroid disease   . Hypertension     Assessment: 35 YOF who presented with abdominal and back pain concerning for a complicated UTI. Pharmacy was consulted to start Rocephin for empiric UTI coverage.   Goal of Therapy:  Proper antibiotics for infection/cultures adjusted for renal/hepatic function   Plan:  1. Start Rocephin 1g IV every 24 hours 2. Pharmacy will sign off as no further dose adjustments are expected.   Alycia Rossetti, PharmD, BCPS Clinical Pharmacist Pager: [phone] 08/29/2015 10:47 PM

## 2015-08-29 NOTE — ED Notes (Signed)
Requested urine sample from pt, pt currently on bedside commode attempting to urinate. Informed pt that if we cannot get urine sample, pt will need to be catheterized.

## 2015-08-29 NOTE — ED Notes (Signed)
Onset one month ago general abdominal pain in all quadrants radiating to lower back with voice hoarse.  Onset 2-3 days ago nausea, emesis denies diarrhea.

## 2015-08-29 NOTE — Progress Notes (Signed)
Report received from [place], RN from ED. Pt is to be admitted in 5W33. Awaiting pt's arrival.

## 2015-08-29 NOTE — ED Provider Notes (Signed)
CSN: [id number]     Arrival date & time 08/29/15  1318 History   First MD Initiated Contact with Patient 08/29/15 1514     Chief Complaint  Patient presents with  . Abdominal Pain  . Back Pain  . Hoarse     Patient is a 60 y.o. female presenting with abdominal pain and back pain. The history is provided by the patient and a relative.  Abdominal Pain Associated symptoms: no chest pain, no diarrhea, no fatigue, no nausea, no shortness of breath and no vomiting   Back Pain Associated symptoms: abdominal pain   Associated symptoms: no chest pain and no headaches    patient presents with a one-month history of abdominal pain and sore throat. Had occasional cough. She's had a harsh voice. She may have lost a couple pounds but no severe weight loss. No nausea vomiting or diarrhea. There is some dull upper abdominal pain. She is a smoker that quit around a month ago. Most of the history comes to the daughter. Daughter speaking since the mother's voice is harsh. Also the pain goes from her upper abdomen through to the back.   Past Medical History  Diagnosis Date  . Diabetes mellitus without complication ([place])   . Thyroid disease    History reviewed. No pertinent past surgical history. Family History  Problem Relation Age of Onset  . Diabetes Mother   . Hypertension Mother   . Heart disease Mother   . Diabetes Sister   . Hypertension Sister   . Cancer Daughter    Social History  Substance Use Topics  . Smoking status: Former Smoker -- 0.50 packs/day for 30 years  . Smokeless tobacco: None     Comment: 8 cigarettes a day  . Alcohol Use: No   OB History    No data available     Review of Systems  Constitutional: Negative for appetite change and fatigue.  HENT: Positive for trouble swallowing and voice change.   Eyes: Negative for pain.  Respiratory: Negative for shortness of breath.   Cardiovascular: Negative for chest pain.  Gastrointestinal: Positive for abdominal pain.  Negative for nausea, vomiting and diarrhea.  Genitourinary: Negative for flank pain and dyspareunia.  Musculoskeletal: Positive for back pain.  Skin: Negative for color change.  Neurological: Negative for headaches.  Hematological: Negative for adenopathy.      Allergies  Review of patient's allergies indicates no known allergies.  Home Medications   Prior to Admission medications   Medication Sig Start Date End Date Taking? Authorizing Provider  ibuprofen (ADVIL,MOTRIN) 200 MG tablet Take 200 mg by mouth daily as needed for moderate pain.   Yes Historical Provider, MD  lisinopril-hydrochlorothiazide (PRINZIDE,ZESTORETIC) 10-12.5 MG per tablet Take 1 tablet by mouth daily. 08/21/13  Yes Lorayne Marek, MD  metFORMIN (GLUCOPHAGE XR) 500 MG 24 hr tablet Take 1 tablet (500 mg total) by mouth daily with breakfast. 08/18/13  Yes Lorayne Marek, MD  Vitamin D, Ergocalciferol, (DRISDOL) 50000 UNITS CAPS capsule Take 1 capsule (50,000 Units total) by mouth every 7 (seven) days. 08/18/13  Yes Lorayne Marek, MD  glucose monitoring kit (FREESTYLE) monitoring kit 1 each by Does not apply route 3 (three) times daily before meals. 07/17/13   Lorayne Marek, MD  nicotine (NICODERM CQ - DOSED IN MG/24 HOURS) 21 mg/24hr patch Place 1 patch (21 mg total) onto the skin daily. Patient not taking: Reported on 08/29/2015 07/17/13   Lorayne Marek, MD   BP 107/69 mmHg  Pulse 119  Temp(Src) 98.2 F (36.8 C) (Oral)  Resp 16  Ht '5\' 3"'$  (1.6 m)  Wt 250 lb (113.399 kg)  BMI 44.30 kg/m2  SpO2 97% Physical Exam  Constitutional: She appears well-developed.  HENT:  Head: Normocephalic.  Some fullness below her tongue on her neck.  Eyes: EOM are normal.  Neck: No thyromegaly present.  Patient has a harsh voice  Cardiovascular:  Regular tachycardia  Pulmonary/Chest: Effort normal.  Abdominal: There is tenderness.  Moderate upper abdominal tenderness without rebound or guarding. No hernias palpated.  Musculoskeletal:  She exhibits no edema.  Neurological: She is alert.  Psychiatric: She has a normal mood and affect.    ED Course  Procedures (including critical care time) Labs Review Labs Reviewed  COMPREHENSIVE METABOLIC PANEL - Abnormal; Notable for the following:    Potassium 3.1 (*)    Chloride 96 (*)    Glucose, Bld 166 (*)    BUN 53 (*)    Creatinine, Ser 1.47 (*)    Albumin 2.9 (*)    AST 13 (*)    ALT 13 (*)    GFR calc non Af Amer 38 (*)    GFR calc Af Amer 44 (*)    All other components within normal limits  CBC - Abnormal; Notable for the following:    WBC 24.8 (*)    Hemoglobin 10.9 (*)    HCT 34.8 (*)    MCH 25.6 (*)    All other components within normal limits  LIPASE, BLOOD  URINALYSIS, ROUTINE W REFLEX MICROSCOPIC (NOT AT [facility])    Imaging Review Dg Chest 2 View  08/29/2015  CLINICAL DATA:  One month history of chest pain and hoarse voice. EXAM: CHEST  2 VIEW COMPARISON:  None. FINDINGS: The heart is borderline and enlarged. Mild tortuosity of the thoracic aorta. Low lung volumes with mild vascular crowding and streaky basilar atelectasis. There also bronchitic changes which could be acute or chronic. No infiltrates or effusions. The bony thorax is intact. IMPRESSION: Acute versus chronic bronchitic change.  No infiltrates or effusions Electronically Signed   By: Marijo Sanes M.D.   On: 08/29/2015 16:15   Ct Soft Tissue Neck W Contrast  08/29/2015  CLINICAL DATA:  60 year old female with abnormal full waist for 1 month. Abdominal pain. Three days of vomiting. Initial encounter. EXAM: CT NECK WITH CONTRAST TECHNIQUE: Multidetector CT imaging of the neck was performed using the standard protocol following the bolus administration of intravenous contrast. CONTRAST:  53m OMNIPAQUE IOHEXOL 300 MG/ML SOLN in conjunction with contrast enhanced imaging of the chest, abdomen, and pelvis reported separately. COMPARISON:  CT chest abdomen and pelvis from today reported separately FINDINGS:  Pharynx and larynx: Bulky supraglottic laryngeal tumor with marked soft tissue enlargement of the bilateral aryepiglottic folds and anterior commissure up to 17 mm in thickness. Tumor appears inseparable from the undersurface of the strap muscles suggesting extension through the thyroid cartilage bilaterally. Posterior hypopharynx involvement. The epiglottis is thickened, nodular, and distorted. Furthermore, the vallecula is mostly effaced and soft tissue at the base of tongue appears nodular and thickened. All told, tumor encompasses 37 x 40 x 58 mm (AP by transverse by CC). The other laryngeal cartilages appear spared. The palatine tonsils, soft palate, and nasopharynx appear within normal limits. Superior parapharyngeal spaces and retropharyngeal space are within normal limits. Salivary glands: Sublingual space, submandibular glands, and parotid glands are within normal limits. Thyroid: Coarsely calcified 2.3 cm right thyroid nodule. Subcentimeter left lobe nodule. Lymph nodes: Abnormal  right level 3 node measuring 9 mm at the level of the thyroid cartilage (series 1, image 57). Small but asymmetric 5 mm right level IIIa lymph node at the level of the hyoid bone on the right (series 1, image 49). Bilateral level 2A nodes appear symmetric measuring 8-9 mm in thickness. No level 4, 5, or level 1 lymphadenopathy. Vascular: Suboptimal intravascular contrast timing, but major vascular structures in the neck and at the skullbase appear to remain patent. Left greater than right carotid bifurcation calcified atherosclerosis. Limited intracranial: Negative.  Partially empty sella. Visualized orbits: Negative. Mastoids and visualized paranasal sinuses: Visualized paranasal sinuses and mastoids are clear. Skeleton: Mostly absent dentition. Periapical lucency about the residual left mandible molar. No osseous metastatic disease identified. Upper chest: Reported separately today. IMPRESSION: 1. Bulky supraglottic laryngeal  tumor which appears to involves the hypopharynx and vallecula measuring up to 5.8 cm in largest dimension. 2. Suspect metastatic nodal disease at level 3 on the right (small but asymmetric up to 9 mm nodes). Bilateral level 2 nodes are indeterminate and also measure up to 9 mm individually. No cystic or necrotic nodes in the neck. 3. See CT chest abdomen and pelvis from today reported separately. Electronically Signed   By: Genevie Ann M.D.   On: 08/29/2015 19:56   Ct Chest W Contrast  08/29/2015  CLINICAL DATA:  60 year old female with 2-3 day history of vomiting. Abdominal pain for the past month. Loss of voice. EXAM: CT CHEST, ABDOMEN, AND PELVIS WITH CONTRAST TECHNIQUE: Multidetector CT imaging of the chest, abdomen and pelvis was performed following the standard protocol during bolus administration of intravenous contrast. CONTRAST:  16m OMNIPAQUE IOHEXOL 300 MG/ML  SOLN COMPARISON:  No priors. FINDINGS: CT CHEST FINDINGS Mediastinum/Lymph Nodes: Heart size is normal. There is no significant pericardial fluid, thickening or pericardial calcification. There is atherosclerosis of the thoracic aorta, the great vessels of the mediastinum and the coronary arteries, including calcified atherosclerotic plaque in the left main, left anterior descending, left circumflex and right coronary arteries. Calcifications of the aortic valve and mitral valve/annulus. No pathologically enlarged mediastinal or hilar lymph nodes. Esophagus is unremarkable in appearance. No axillary lymphadenopathy. Lungs/Pleura: 4 mm subpleural nodule in the lateral segment of the right middle lobe (image 31 of series 4). 4 mm subpleural nodule in the posterior left lower lobe (image 38 of series 4). No other suspicious appearing pulmonary nodules or masses. No acute consolidative airspace disease. No pleural effusions. Musculoskeletal/Soft Tissues: There are no aggressive appearing lytic or blastic lesions noted in the visualized portions of the  skeleton. CT ABDOMEN AND PELVIS FINDINGS Hepatobiliary: No cystic or solid hepatic lesions. No intra or extrahepatic biliary ductal dilatation. 7 mm calcified gallstone lying dependently in the gallbladder. No current findings to suggest an acute cholecystitis at this time. Pancreas: No pancreatic mass. No pancreatic ductal dilatation. No pancreatic or peripancreatic fluid or inflammatory changes. Spleen: Unremarkable. Adrenals/Urinary Tract: In the lower pole of the left kidney there is a 5.9 x 6.1 x 6.3 cm heterogeneously enhancing lesion highly concerning for renal cell carcinoma. The inferior aspect of this lesion comes in very close proximity to the anterior aspect of the left psoas muscle and quadratus lumborum muscle, however, there appears to be an intervening fat plane at this time. The lesion is well separated from the left renal vein. Right kidney and right adrenal gland are normal in appearance. 1.2 x 1.4 cm indeterminate nodule in the lateral limb of the left adrenal gland. No hydroureteronephrosis.  Urinary bladder is largely decompressed, but otherwise unremarkable in appearance. Stomach/Bowel: Normal appearance of the stomach. No pathologic dilatation of small bowel or colon. A few scattered colonic diverticulae are noted, without surrounding inflammatory changes to suggest an acute diverticulitis at this time. Appendix is normal. Vascular/Lymphatic: Atherosclerosis throughout the abdominal and pelvic vasculature, without evidence of aneurysm or dissection. Left renal vein is widely patent and separate from the lower pole mass. No lymphadenopathy noted in the abdomen or pelvis. Reproductive: Uterus and ovaries are unremarkable in appearance. Other: No significant volume of ascites.  No pneumoperitoneum. Musculoskeletal: There are no aggressive appearing lytic or blastic lesions noted in the visualized portions of the skeleton. IMPRESSION: 1. No acute findings in the abdomen or pelvis to account for  the patient's symptoms. 2. However, there is a 5.9 x 6.1 x 6.3 cm heterogeneously enhancing mass in the lower pole of the left kidney highly concerning for renal cell carcinoma. At this time, the lesion appears likely to be encapsulated within Gerota's fascia (although it comes very close to the left psoas and quadratus lumborum musculature), does not involve the left renal vein, and does not appear to be associated with lymphadenopathy. Nonemergent Urologic consultation for surgical resection is strongly recommended in the near future. 3. 1.2 x 1.4 cm indeterminate nodule in the left adrenal gland. Attention on followup studies is recommended, as a metastatic lesion is not excluded. 4. 4 mm subpleural nodules in the right middle lobe and left lower lobe. These are highly nonspecific, and favored to represent subpleural lymph nodes, but attention on followup studies is recommended to ensure stability. 5. Cholelithiasis without evidence of acute cholecystitis at this time. 6. Atherosclerosis, including left main and 3 vessel coronary artery disease. Please note that although the presence of coronary artery calcium documents the presence of coronary artery disease, the severity of this disease and any potential stenosis cannot be assessed on this non-gated CT examination. Assessment for potential risk factor modification, dietary therapy or pharmacologic therapy may be warranted, if clinically indicated. 7. There are calcifications of the aortic valve and mitral valve/annulus. Echocardiographic correlation for evaluation of potential valvular dysfunction may be warranted if clinically indicated. 8. Colonic diverticulosis without evidence of acute diverticulitis at this time. 9. Additional incidental findings, as above. These results were called by telephone at the time of interpretation on 08/29/2015 at 7:34 pm to Dr. Davonna Belling, who verbally acknowledged these results. Electronically Signed   By: Vinnie Langton M.D.   On: 08/29/2015 19:37   Ct Abdomen Pelvis W Contrast  08/29/2015  CLINICAL DATA:  60 year old female with 2-3 day history of vomiting. Abdominal pain for the past month. Loss of voice. EXAM: CT CHEST, ABDOMEN, AND PELVIS WITH CONTRAST TECHNIQUE: Multidetector CT imaging of the chest, abdomen and pelvis was performed following the standard protocol during bolus administration of intravenous contrast. CONTRAST:  34m OMNIPAQUE IOHEXOL 300 MG/ML  SOLN COMPARISON:  No priors. FINDINGS: CT CHEST FINDINGS Mediastinum/Lymph Nodes: Heart size is normal. There is no significant pericardial fluid, thickening or pericardial calcification. There is atherosclerosis of the thoracic aorta, the great vessels of the mediastinum and the coronary arteries, including calcified atherosclerotic plaque in the left main, left anterior descending, left circumflex and right coronary arteries. Calcifications of the aortic valve and mitral valve/annulus. No pathologically enlarged mediastinal or hilar lymph nodes. Esophagus is unremarkable in appearance. No axillary lymphadenopathy. Lungs/Pleura: 4 mm subpleural nodule in the lateral segment of the right middle lobe (image 31 of series 4).  4 mm subpleural nodule in the posterior left lower lobe (image 38 of series 4). No other suspicious appearing pulmonary nodules or masses. No acute consolidative airspace disease. No pleural effusions. Musculoskeletal/Soft Tissues: There are no aggressive appearing lytic or blastic lesions noted in the visualized portions of the skeleton. CT ABDOMEN AND PELVIS FINDINGS Hepatobiliary: No cystic or solid hepatic lesions. No intra or extrahepatic biliary ductal dilatation. 7 mm calcified gallstone lying dependently in the gallbladder. No current findings to suggest an acute cholecystitis at this time. Pancreas: No pancreatic mass. No pancreatic ductal dilatation. No pancreatic or peripancreatic fluid or inflammatory changes. Spleen:  Unremarkable. Adrenals/Urinary Tract: In the lower pole of the left kidney there is a 5.9 x 6.1 x 6.3 cm heterogeneously enhancing lesion highly concerning for renal cell carcinoma. The inferior aspect of this lesion comes in very close proximity to the anterior aspect of the left psoas muscle and quadratus lumborum muscle, however, there appears to be an intervening fat plane at this time. The lesion is well separated from the left renal vein. Right kidney and right adrenal gland are normal in appearance. 1.2 x 1.4 cm indeterminate nodule in the lateral limb of the left adrenal gland. No hydroureteronephrosis. Urinary bladder is largely decompressed, but otherwise unremarkable in appearance. Stomach/Bowel: Normal appearance of the stomach. No pathologic dilatation of small bowel or colon. A few scattered colonic diverticulae are noted, without surrounding inflammatory changes to suggest an acute diverticulitis at this time. Appendix is normal. Vascular/Lymphatic: Atherosclerosis throughout the abdominal and pelvic vasculature, without evidence of aneurysm or dissection. Left renal vein is widely patent and separate from the lower pole mass. No lymphadenopathy noted in the abdomen or pelvis. Reproductive: Uterus and ovaries are unremarkable in appearance. Other: No significant volume of ascites.  No pneumoperitoneum. Musculoskeletal: There are no aggressive appearing lytic or blastic lesions noted in the visualized portions of the skeleton. IMPRESSION: 1. No acute findings in the abdomen or pelvis to account for the patient's symptoms. 2. However, there is a 5.9 x 6.1 x 6.3 cm heterogeneously enhancing mass in the lower pole of the left kidney highly concerning for renal cell carcinoma. At this time, the lesion appears likely to be encapsulated within Gerota's fascia (although it comes very close to the left psoas and quadratus lumborum musculature), does not involve the left renal vein, and does not appear to be  associated with lymphadenopathy. Nonemergent Urologic consultation for surgical resection is strongly recommended in the near future. 3. 1.2 x 1.4 cm indeterminate nodule in the left adrenal gland. Attention on followup studies is recommended, as a metastatic lesion is not excluded. 4. 4 mm subpleural nodules in the right middle lobe and left lower lobe. These are highly nonspecific, and favored to represent subpleural lymph nodes, but attention on followup studies is recommended to ensure stability. 5. Cholelithiasis without evidence of acute cholecystitis at this time. 6. Atherosclerosis, including left main and 3 vessel coronary artery disease. Please note that although the presence of coronary artery calcium documents the presence of coronary artery disease, the severity of this disease and any potential stenosis cannot be assessed on this non-gated CT examination. Assessment for potential risk factor modification, dietary therapy or pharmacologic therapy may be warranted, if clinically indicated. 7. There are calcifications of the aortic valve and mitral valve/annulus. Echocardiographic correlation for evaluation of potential valvular dysfunction may be warranted if clinically indicated. 8. Colonic diverticulosis without evidence of acute diverticulitis at this time. 9. Additional incidental findings, as above. These  results were called by telephone at the time of interpretation on 08/29/2015 at 7:34 pm to Dr. Davonna Belling, who verbally acknowledged these results. Electronically Signed   By: Vinnie Langton M.D.   On: 08/29/2015 19:37   I have personally reviewed and evaluated these images and lab results as part of my medical decision-making.   EKG Interpretation   Date/Time:  Monday August 29 2015 14:32:09 EST Ventricular Rate:  130 PR Interval:  134 QRS Duration: 110 QT Interval:  324 QTC Calculation: 476 R Axis:   -56 Text Interpretation:  Sinus tachycardia with occasional Premature   ventricular complexes Left anterior fascicular block Left ventricular  hypertrophy with repolarization abnormality Cannot rule out Septal infarct  , age undetermined Abnormal ECG Confirmed by Alvino Chapel  MD, Ovid Curd [phone])  on 08/29/2015 3:33:55 PM      MDM   Final diagnoses:  Laryngeal cancer [facility])    Patient with apparent metastatic laryngeal cancer. Possibly could have renal cancer. Continued pain. Will admit to internal medicine.    Davonna Belling, MD 08/29/15 [phone]

## 2015-08-30 ENCOUNTER — Ambulatory Visit: Payer: Self-pay | Admitting: Otolaryngology

## 2015-08-30 DIAGNOSIS — E46 Unspecified protein-calorie malnutrition: Secondary | ICD-10-CM | POA: Diagnosis present

## 2015-08-30 DIAGNOSIS — E876 Hypokalemia: Secondary | ICD-10-CM | POA: Diagnosis present

## 2015-08-30 DIAGNOSIS — Z7984 Long term (current) use of oral hypoglycemic drugs: Secondary | ICD-10-CM | POA: Diagnosis not present

## 2015-08-30 DIAGNOSIS — C321 Malignant neoplasm of supraglottis: Secondary | ICD-10-CM | POA: Diagnosis not present

## 2015-08-30 DIAGNOSIS — C7900 Secondary malignant neoplasm of unspecified kidney and renal pelvis: Secondary | ICD-10-CM | POA: Diagnosis present

## 2015-08-30 DIAGNOSIS — R490 Dysphonia: Secondary | ICD-10-CM | POA: Diagnosis present

## 2015-08-30 DIAGNOSIS — N12 Tubulo-interstitial nephritis, not specified as acute or chronic: Secondary | ICD-10-CM | POA: Diagnosis present

## 2015-08-30 DIAGNOSIS — Z515 Encounter for palliative care: Secondary | ICD-10-CM | POA: Diagnosis present

## 2015-08-30 DIAGNOSIS — Z8249 Family history of ischemic heart disease and other diseases of the circulatory system: Secondary | ICD-10-CM | POA: Diagnosis not present

## 2015-08-30 DIAGNOSIS — Z6841 Body Mass Index (BMI) 40.0 and over, adult: Secondary | ICD-10-CM | POA: Diagnosis not present

## 2015-08-30 DIAGNOSIS — R918 Other nonspecific abnormal finding of lung field: Secondary | ICD-10-CM | POA: Diagnosis present

## 2015-08-30 DIAGNOSIS — E119 Type 2 diabetes mellitus without complications: Secondary | ICD-10-CM | POA: Diagnosis present

## 2015-08-30 DIAGNOSIS — K264 Chronic or unspecified duodenal ulcer with hemorrhage: Secondary | ICD-10-CM | POA: Diagnosis not present

## 2015-08-30 DIAGNOSIS — I10 Essential (primary) hypertension: Secondary | ICD-10-CM | POA: Diagnosis present

## 2015-08-30 DIAGNOSIS — B9620 Unspecified Escherichia coli [E. coli] as the cause of diseases classified elsewhere: Secondary | ICD-10-CM | POA: Diagnosis present

## 2015-08-30 DIAGNOSIS — Z87891 Personal history of nicotine dependence: Secondary | ICD-10-CM | POA: Diagnosis not present

## 2015-08-30 DIAGNOSIS — I2510 Atherosclerotic heart disease of native coronary artery without angina pectoris: Secondary | ICD-10-CM | POA: Diagnosis present

## 2015-08-30 DIAGNOSIS — R1313 Dysphagia, pharyngeal phase: Secondary | ICD-10-CM | POA: Diagnosis present

## 2015-08-30 DIAGNOSIS — D638 Anemia in other chronic diseases classified elsewhere: Secondary | ICD-10-CM | POA: Diagnosis present

## 2015-08-30 DIAGNOSIS — F1010 Alcohol abuse, uncomplicated: Secondary | ICD-10-CM | POA: Diagnosis present

## 2015-08-30 DIAGNOSIS — D62 Acute posthemorrhagic anemia: Secondary | ICD-10-CM | POA: Diagnosis present

## 2015-08-30 DIAGNOSIS — D500 Iron deficiency anemia secondary to blood loss (chronic): Secondary | ICD-10-CM | POA: Diagnosis present

## 2015-08-30 DIAGNOSIS — E079 Disorder of thyroid, unspecified: Secondary | ICD-10-CM | POA: Diagnosis present

## 2015-08-30 DIAGNOSIS — E870 Hyperosmolality and hypernatremia: Secondary | ICD-10-CM | POA: Diagnosis not present

## 2015-08-30 DIAGNOSIS — Z809 Family history of malignant neoplasm, unspecified: Secondary | ICD-10-CM | POA: Diagnosis not present

## 2015-08-30 DIAGNOSIS — Z79899 Other long term (current) drug therapy: Secondary | ICD-10-CM | POA: Diagnosis not present

## 2015-08-30 DIAGNOSIS — N179 Acute kidney failure, unspecified: Secondary | ICD-10-CM | POA: Diagnosis not present

## 2015-08-30 LAB — CBC
HCT: 27.6 % — ABNORMAL LOW (ref 36.0–46.0)
HEMATOCRIT: 24.9 % — AB (ref 36.0–46.0)
HEMATOCRIT: 22.3 % — AB (ref 36.0–46.0)
HEMOGLOBIN: 7.8 g/dL — AB (ref 12.0–15.0)
HEMOGLOBIN: 7.2 g/dL — AB (ref 12.0–15.0)
Hemoglobin: 8.8 g/dL — ABNORMAL LOW (ref 12.0–15.0)
MCH: 26 pg (ref 26.0–34.0)
MCH: 25.6 pg — ABNORMAL LOW (ref 26.0–34.0)
MCH: 26.2 pg (ref 26.0–34.0)
MCHC: 31.9 g/dL (ref 30.0–36.0)
MCHC: 31.3 g/dL (ref 30.0–36.0)
MCHC: 32.3 g/dL (ref 30.0–36.0)
MCV: 81.4 fL (ref 78.0–100.0)
MCV: 81.6 fL (ref 78.0–100.0)
MCV: 81.1 fL (ref 78.0–100.0)
PLATELETS: 217 10*3/uL (ref 150–400)
Platelets: 224 10*3/uL (ref 150–400)
Platelets: 195 10*3/uL (ref 150–400)
RBC: 3.39 MIL/uL — AB (ref 3.87–5.11)
RBC: 3.05 MIL/uL — ABNORMAL LOW (ref 3.87–5.11)
RBC: 2.75 MIL/uL — ABNORMAL LOW (ref 3.87–5.11)
RDW: 13.7 % (ref 11.5–15.5)
RDW: 13.6 % (ref 11.5–15.5)
RDW: 14 % (ref 11.5–15.5)
WBC: 20.4 10*3/uL — ABNORMAL HIGH (ref 4.0–10.5)
WBC: 20.2 10*3/uL — ABNORMAL HIGH (ref 4.0–10.5)
WBC: 19.8 10*3/uL — AB (ref 4.0–10.5)

## 2015-08-30 LAB — BASIC METABOLIC PANEL
Anion gap: 10 (ref 5–15)
BUN: 56 mg/dL — ABNORMAL HIGH (ref 6–20)
CALCIUM: 8.3 mg/dL — AB (ref 8.9–10.3)
CO2: 31 mmol/L (ref 22–32)
CREATININE: 1.46 mg/dL — AB (ref 0.44–1.00)
Chloride: 96 mmol/L — ABNORMAL LOW (ref 101–111)
GFR calc non Af Amer: 38 mL/min — ABNORMAL LOW (ref >60)
GFR, EST AFRICAN AMERICAN: 44 mL/min — AB (ref >60)
Glucose, Bld: 165 mg/dL — ABNORMAL HIGH (ref 65–99)
Potassium: 2.9 mmol/L — ABNORMAL LOW (ref 3.5–5.1)
Sodium: 137 mmol/L (ref 135–145)

## 2015-08-30 LAB — MAGNESIUM: MAGNESIUM: 1.5 mg/dL — AB (ref 1.7–2.4)

## 2015-08-30 LAB — RETICULOCYTES
RBC.: 2.84 MIL/uL — AB (ref 3.87–5.11)
Retic Count, Absolute: 42.6 10*3/uL (ref 19.0–186.0)
Retic Ct Pct: 1.5 % (ref 0.4–3.1)

## 2015-08-30 LAB — IRON AND TIBC
Iron: 38 ug/dL (ref 28–170)
SATURATION RATIOS: 18 % (ref 10.4–31.8)
TIBC: 207 ug/dL — AB (ref 250–450)
UIBC: 169 ug/dL

## 2015-08-30 LAB — VITAMIN B12: VITAMIN B 12: 801 pg/mL (ref 180–914)

## 2015-08-30 LAB — LACTIC ACID, PLASMA
LACTIC ACID, VENOUS: 1 mmol/L (ref 0.5–2.0)
Lactic Acid, Venous: 1.2 mmol/L (ref 0.5–2.0)

## 2015-08-30 LAB — FERRITIN: Ferritin: 333 ng/mL — ABNORMAL HIGH (ref 11–307)

## 2015-08-30 LAB — GLUCOSE, CAPILLARY: Glucose-Capillary: 144 mg/dL — ABNORMAL HIGH (ref 65–99)

## 2015-08-30 LAB — PREPARE RBC (CROSSMATCH)

## 2015-08-30 LAB — FOLATE: Folate: 17.3 ng/mL (ref >5.9)

## 2015-08-30 LAB — ABO/RH: ABO/RH(D): B POS

## 2015-08-30 MED ORDER — POTASSIUM CHLORIDE CRYS ER 20 MEQ PO TBCR
40.0000 meq | EXTENDED_RELEASE_TABLET | Freq: Two times a day (BID) | ORAL | Status: AC
  Administered 2015-08-30 (×3): 40 meq via ORAL
  Filled 2015-08-30 (×3): qty 2

## 2015-08-30 MED ORDER — SODIUM CHLORIDE 0.9 % IV BOLUS (SEPSIS): 1000.0000 mL | Freq: Once | INTRAVENOUS | Status: DC

## 2015-08-30 MED ORDER — SODIUM CHLORIDE 0.9 % IV SOLN: Freq: Once | INTRAVENOUS | Status: DC

## 2015-08-30 MED ORDER — INSULIN ASPART 100 UNIT/ML ~~LOC~~ SOLN
0.0000 [IU] | Freq: Three times a day (TID) | SUBCUTANEOUS | Status: DC
  Administered 2015-08-31 – 2015-09-01 (×2): 1 [IU] via SUBCUTANEOUS
  Administered 2015-09-01: 2 [IU] via SUBCUTANEOUS

## 2015-08-30 MED ORDER — LORAZEPAM 1 MG PO TABS: 1.0000 mg | ORAL_TABLET | Freq: Four times a day (QID) | ORAL | Status: DC | PRN

## 2015-08-30 MED ORDER — LORAZEPAM 2 MG/ML IJ SOLN: 1.0000 mg | Freq: Four times a day (QID) | INTRAMUSCULAR | Status: DC | PRN

## 2015-08-30 MED ORDER — LIDOCAINE HCL 4 % EX SOLN
0.0000 mL | Freq: Once | CUTANEOUS | Status: DC | PRN
  Filled 2015-08-30: qty 50

## 2015-08-30 MED ORDER — DIPHENHYDRAMINE HCL 50 MG/ML IJ SOLN
25.0000 mg | Freq: Once | INTRAMUSCULAR | Status: DC
  Filled 2015-08-30: qty 1

## 2015-08-30 MED ORDER — VITAMIN B-1 100 MG PO TABS
100.0000 mg | ORAL_TABLET | Freq: Every day | ORAL | Status: DC
  Filled 2015-08-30: qty 1

## 2015-08-30 MED ORDER — ADULT MULTIVITAMIN W/MINERALS CH
1.0000 | ORAL_TABLET | Freq: Every day | ORAL | Status: DC
  Administered 2015-08-30 – 2015-08-31 (×2): 1 via ORAL
  Filled 2015-08-30 (×3): qty 1

## 2015-08-30 MED ORDER — ACETAMINOPHEN 325 MG PO TABS
650.0000 mg | ORAL_TABLET | Freq: Once | ORAL | Status: AC
  Administered 2015-08-30: 650 mg via ORAL
  Filled 2015-08-30: qty 2

## 2015-08-30 MED ORDER — MAGNESIUM SULFATE 2 GM/50ML IV SOLN
2.0000 g | Freq: Once | INTRAVENOUS | Status: AC
  Administered 2015-08-30: 2 g via INTRAVENOUS
  Filled 2015-08-30: qty 50

## 2015-08-30 MED ORDER — FOLIC ACID 1 MG PO TABS
1.0000 mg | ORAL_TABLET | Freq: Every day | ORAL | Status: DC
  Administered 2015-08-30 – 2015-08-31 (×2): 1 mg via ORAL
  Filled 2015-08-30 (×3): qty 1

## 2015-08-30 MED ORDER — PANTOPRAZOLE SODIUM 40 MG IV SOLR
40.0000 mg | Freq: Two times a day (BID) | INTRAVENOUS | Status: DC
  Administered 2015-08-30 – 2015-09-03 (×10): 40 mg via INTRAVENOUS
  Filled 2015-08-30 (×10): qty 40

## 2015-08-30 MED ORDER — OXYMETAZOLINE HCL 0.05 % NA SOLN
1.0000 | Freq: Once | NASAL | Status: DC | PRN
  Filled 2015-08-30: qty 15

## 2015-08-30 MED ORDER — THIAMINE HCL 100 MG/ML IJ SOLN
100.0000 mg | Freq: Every day | INTRAMUSCULAR | Status: DC
  Administered 2015-08-30 – 2015-09-07 (×8): 100 mg via INTRAVENOUS
  Filled 2015-08-30 (×3): qty 2
  Filled 2015-08-30: qty 1
  Filled 2015-08-30 (×4): qty 2

## 2015-08-30 NOTE — Progress Notes (Signed)
pt reported to admission nurse that  she drinks 1/5th of liquor (15oz) per week. Pt denies of drinking to primary nurse. Current CIWA score is 1. On-call MD C.  Danford made aware . Pt is placed on CIWA protocol. MD also made aware of recent  BP(93/51)  and orthostatic vitals. Pt remains asymptomatic. Will monitor.

## 2015-08-30 NOTE — Progress Notes (Addendum)
PATIENT DETAILS Name: Ashley Ramsey Age: 60 y.o. Sex: female Date of Birth: [birth year] Admit Date: 08/29/2015 Admitting Physician [name], MD [id number], [name], MD  Subjective: Had episode of hematochezia this afternoon. Continues to have significant hoarseness of voice. Per family-has been having voice change and call for the past few months!  Assessment/Plan: Active Problems: Probable head and neck cancer: ENT input appreciated, suspect would benefit from getting inpatient biopsy-subsequently will need referral Oncology.  Left renal mass: Highly suspicious for renal cell carcinoma. Doubt it is related to above. Spoke with Dr. Tresa Moore was reviewed patient's CT scan in chart-suggested nothing to be done in the inpatient setting-outpatient follow with him in the office for consideration of nephrectomy in the future.  No history of hematuria.  UTI with SIRS: Continue Rocephin, await cultures. Afebrile, but significant leukocytosis-however WBCs downtrending.  Lower GI bleeding: Ongoing for the past 1 week intermittently-has had one episode of hematochezia in the hospital. CT scan abdomen shows diverticula-likely diverticular bleeding. Follow CBC, transfuse when necessary, GI consulted.  Suspected acute blood loss anemia: Likely secondary to hematochezia, probably has chronic anemia from chronic disease. Follow CBC for now. Transfuse as needed.  Addendum 4:30 pm Hb decreased to 7.8-given GI bleed-probable hx of CAD-will transfuse 1 unit.  Acute renal failure: Likely prerenal azotemia in a setting of UTI/GI bleeding-along with lisinopril/HCTZ use. Avoid nephrotoxic agents, continue gentle hydration. Follow electrolytes  Hypokalemia: Replete and recheck. Replete magnesium as well.  Type 2 diabetes: Start SSI-continue to hold metformin. Follow CBGs  Hypertension: Continue to hold lisinopril/HCTZ. Blood pressure remains soft, continue IV fluids  Tobacco abuse:  Counseled  Alcohol abuse: Counseled, Ativan per protocol  Left adrenal gland nodule: Will need outpatient follow-up-specially given left adrenal mass.  Subpleural nodules in the right middle lobe of right lung and left lower lobe of left lung: Will need to repeat CT chest in 6 months.  Possible CAD: Atherosclerosis seen CT chest. Unfortunately with ongoing lower GI bleeding-unable to use antiplatelets. Check echo, but given numerous above medical problems-not a candidate for any procedures/further workup at this point.  Morbid obesity: Has lost significant amount of weight over the past few months.  Disposition: Remain inpatient  Antimicrobial agents  See below  Anti-infectives    Start     Dose/Rate Route Frequency Ordered Stop   08/29/15 2330  cefTRIAXone (ROCEPHIN) 1 g in dextrose 5 % 50 mL IVPB     1 g 100 mL/hr over 30 Minutes Intravenous Every 24 hours 08/29/15 2250        DVT Prophylaxis:  SCD's  Code Status: Full code   Family Communication Daughters at bedside  Procedures: None  CONSULTS:  GI, urology and ent  Time spent 30 minutes-Greater than 50% of this time was spent in counseling, explanation of diagnosis, planning of further management, and coordination of care.  MEDICATIONS: Scheduled Meds: . cefTRIAXone (ROCEPHIN)  IV  1 g Intravenous Q24H  . folic acid  1 mg Oral Daily  . Influenza vac split quadrivalent PF  0.5 mL Intramuscular Tomorrow-1000  . multivitamin with minerals  1 tablet Oral Daily  . pantoprazole (PROTONIX) IV  40 mg Intravenous Q12H  . potassium chloride  40 mEq Oral BID  . thiamine  100 mg Oral Daily   Or  . thiamine  100 mg Intravenous Daily   Continuous Infusions: . sodium chloride 125 mL/hr at 08/30/15 0021  PRN Meds:.acetaminophen **OR** acetaminophen, HYDROcodone-acetaminophen, lidocaine, LORazepam **OR** LORazepam, ondansetron **OR** ondansetron (ZOFRAN) IV, oxymetazoline    PHYSICAL EXAM: Vital signs in last 24  hours: Filed Vitals:   08/29/15 2326 08/30/15 0026 08/30/15 0553 08/30/15 1405  BP: 93/64 93/51 103/57 90/75  Pulse: 120 102 114 102  Temp:  98.3 F (36.8 C) 99.1 F (37.3 C) 97.9 F (36.6 C)  TempSrc:  Oral Oral Oral  Resp:  15 18 16   Height:      Weight:      SpO2:  92% 98% 96%    Weight change:  Filed Weights   08/29/15 1422  Weight: 113.399 kg (250 lb)   Body mass index is 44.3 kg/(m^2).   Gen Exam: Awake and alert with clear speech. Obese. Voice is very hoarse and weak. Neck: Supple-exam limited by body habitus. No stridor Chest: B/L Clear.   CVS: S1 S2 Regular, no murmurs.  Abdomen: soft, BS +, non tender, non distended.  Extremities: no edema, lower extremities warm to touch. Neurologic: Non Focal.   Skin: No Rash.   Wounds: N/A.   Intake/Output from previous day:  Intake/Output Summary (Last 24 hours) at 08/30/15 1444 Last data filed at 08/30/15 1000  Gross per 24 hour  Intake 2104.58 ml  Output    700 ml  Net 1404.58 ml     LAB RESULTS: CBC  Recent Labs Lab 08/29/15 1446 08/30/15 0142  WBC 24.8* 20.4*  HGB 10.9* 8.8*  HCT 34.8* 27.6*  PLT 287 217  MCV 81.9 81.4  MCH 25.6* 26.0  MCHC 31.3 31.9  RDW 13.5 13.7    Chemistries   Recent Labs Lab 08/29/15 1446 08/30/15 0142  NA 138 137  K 3.1* 2.9*  CL 96* 96*  CO2 27 31  GLUCOSE 166* 165*  BUN 53* 56*  CREATININE 1.47* 1.46*  CALCIUM 9.1 8.3*  MG  --  1.5*    CBG: No results for input(s): GLUCAP in the last 168 hours.  GFR Estimated Creatinine Clearance: 50.3 mL/min (by C-G formula based on Cr of 1.46).  Coagulation profile No results for input(s): INR, PROTIME in the last 168 hours.  Cardiac Enzymes No results for input(s): CKMB, TROPONINI, MYOGLOBIN in the last 168 hours.  Invalid input(s): CK  Invalid input(s): POCBNP No results for input(s): DDIMER in the last 72 hours. No results for input(s): HGBA1C in the last 72 hours. No results for input(s): CHOL, HDL,  LDLCALC, TRIG, CHOLHDL, LDLDIRECT in the last 72 hours. No results for input(s): TSH, T4TOTAL, T3FREE, THYROIDAB in the last 72 hours.  Invalid input(s): FREET3 No results for input(s): VITAMINB12, FOLATE, FERRITIN, TIBC, IRON, RETICCTPCT in the last 72 hours.  Recent Labs  08/29/15 1446  LIPASE 22    Urine Studies No results for input(s): UHGB, CRYS in the last 72 hours.  Invalid input(s): UACOL, UAPR, USPG, UPH, UTP, UGL, UKET, UBIL, UNIT, UROB, ULEU, UEPI, UWBC, URBC, UBAC, CAST, UCOM, BILUA  MICROBIOLOGY: No results found for this or any previous visit (from the past 240 hour(s)).  RADIOLOGY STUDIES/RESULTS: Dg Chest 2 View  08/29/2015  CLINICAL DATA:  One month history of chest pain and hoarse voice. EXAM: CHEST  2 VIEW COMPARISON:  None. FINDINGS: The heart is borderline and enlarged. Mild tortuosity of the thoracic aorta. Low lung volumes with mild vascular crowding and streaky basilar atelectasis. There also bronchitic changes which could be acute or chronic. No infiltrates or effusions. The bony thorax is intact. IMPRESSION: Acute versus chronic  bronchitic change.  No infiltrates or effusions Electronically Signed   By: Marijo Sanes M.D.   On: 08/29/2015 16:15   Ct Soft Tissue Neck W Contrast  08/29/2015  CLINICAL DATA:  60 year old female with abnormal full waist for 1 month. Abdominal pain. Three days of vomiting. Initial encounter. EXAM: CT NECK WITH CONTRAST TECHNIQUE: Multidetector CT imaging of the neck was performed using the standard protocol following the bolus administration of intravenous contrast. CONTRAST:  45mL OMNIPAQUE IOHEXOL 300 MG/ML SOLN in conjunction with contrast enhanced imaging of the chest, abdomen, and pelvis reported separately. COMPARISON:  CT chest abdomen and pelvis from today reported separately FINDINGS: Pharynx and larynx: Bulky supraglottic laryngeal tumor with marked soft tissue enlargement of the bilateral aryepiglottic folds and anterior  commissure up to 17 mm in thickness. Tumor appears inseparable from the undersurface of the strap muscles suggesting extension through the thyroid cartilage bilaterally. Posterior hypopharynx involvement. The epiglottis is thickened, nodular, and distorted. Furthermore, the vallecula is mostly effaced and soft tissue at the base of tongue appears nodular and thickened. All told, tumor encompasses 37 x 40 x 58 mm (AP by transverse by CC). The other laryngeal cartilages appear spared. The palatine tonsils, soft palate, and nasopharynx appear within normal limits. Superior parapharyngeal spaces and retropharyngeal space are within normal limits. Salivary glands: Sublingual space, submandibular glands, and parotid glands are within normal limits. Thyroid: Coarsely calcified 2.3 cm right thyroid nodule. Subcentimeter left lobe nodule. Lymph nodes: Abnormal right level 3 node measuring 9 mm at the level of the thyroid cartilage (series 1, image 57). Small but asymmetric 5 mm right level IIIa lymph node at the level of the hyoid bone on the right (series 1, image 49). Bilateral level 2A nodes appear symmetric measuring 8-9 mm in thickness. No level 4, 5, or level 1 lymphadenopathy. Vascular: Suboptimal intravascular contrast timing, but major vascular structures in the neck and at the skullbase appear to remain patent. Left greater than right carotid bifurcation calcified atherosclerosis. Limited intracranial: Negative.  Partially empty sella. Visualized orbits: Negative. Mastoids and visualized paranasal sinuses: Visualized paranasal sinuses and mastoids are clear. Skeleton: Mostly absent dentition. Periapical lucency about the residual left mandible molar. No osseous metastatic disease identified. Upper chest: Reported separately today. IMPRESSION: 1. 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 2. Suspect metastatic nodal disease  at level 3 on the right (small but asymmetric up to 9 mm nodes). Bilateral level 2 nodes are indeterminate and also measure up to 9 mm individually. No cystic or necrotic nodes in the neck. 3. See CT chest abdomen and pelvis from today reported separately. Electronically Signed   By: Genevie Ann M.D.   On: 08/29/2015 19:56   Ct Chest W Contrast  08/29/2015  CLINICAL DATA:  60 year old female with 2-3 day history of vomiting. Abdominal pain for the past month. Loss of voice. EXAM: CT CHEST, ABDOMEN, AND PELVIS WITH CONTRAST TECHNIQUE: Multidetector CT imaging of the chest, abdomen and pelvis was performed following the standard protocol during bolus administration of intravenous contrast. CONTRAST:  15mL OMNIPAQUE IOHEXOL 300 MG/ML  SOLN COMPARISON:  No priors. FINDINGS: CT CHEST FINDINGS Mediastinum/Lymph Nodes: Heart size is normal. There is no significant pericardial fluid, thickening or pericardial calcification. There is atherosclerosis of the thoracic aorta, the great vessels of the mediastinum and the coronary arteries, including calcified atherosclerotic plaque in the left main, left anterior descending, left circumflex and right coronary  arteries. Calcifications of the aortic valve and mitral valve/annulus. No pathologically enlarged mediastinal or hilar lymph nodes. Esophagus is unremarkable in appearance. No axillary lymphadenopathy. Lungs/Pleura: 4 mm subpleural nodule in the lateral segment of the right middle lobe (image 31 of series 4). 4 mm subpleural nodule in the posterior left lower lobe (image 38 of series 4). No other suspicious appearing pulmonary nodules or masses. No acute consolidative airspace disease. No pleural effusions. Musculoskeletal/Soft Tissues: There are no aggressive appearing lytic or blastic lesions noted in the visualized portions of the skeleton. CT ABDOMEN AND PELVIS FINDINGS Hepatobiliary: No cystic or solid hepatic lesions. No intra or extrahepatic biliary ductal dilatation.  7 mm calcified gallstone lying dependently in the gallbladder. No current findings to suggest an acute cholecystitis at this time. Pancreas: No pancreatic mass. No pancreatic ductal dilatation. No pancreatic or peripancreatic fluid or inflammatory changes. Spleen: Unremarkable. Adrenals/Urinary Tract: In the lower pole of the left kidney there is a 5.9 x 6.1 x 6.3 cm heterogeneously enhancing lesion highly concerning for renal cell carcinoma. The inferior aspect of this lesion comes in very close proximity to the anterior aspect of the left psoas muscle and quadratus lumborum muscle, however, there appears to be an intervening fat plane at this time. The lesion is well separated from the left renal vein. Right kidney and right adrenal gland are normal in appearance. 1.2 x 1.4 cm indeterminate nodule in the lateral limb of the left adrenal gland. No hydroureteronephrosis. Urinary bladder is largely decompressed, but otherwise unremarkable in appearance. Stomach/Bowel: Normal appearance of the stomach. No pathologic dilatation of small bowel or colon. A few scattered colonic diverticulae are noted, without surrounding inflammatory changes to suggest an acute diverticulitis at this time. Appendix is normal. Vascular/Lymphatic: Atherosclerosis throughout the abdominal and pelvic vasculature, without evidence of aneurysm or dissection. Left renal vein is widely patent and separate from the lower pole mass. No lymphadenopathy noted in the abdomen or pelvis. Reproductive: Uterus and ovaries are unremarkable in appearance. Other: No significant volume of ascites.  No pneumoperitoneum. Musculoskeletal: There are no aggressive appearing lytic or blastic lesions noted in the visualized portions of the skeleton. IMPRESSION: 1. No acute findings in the abdomen or pelvis to account for the patient's symptoms. 2. However, there is a 5.9 x 6.1 x 6.3 cm heterogeneously enhancing mass in the lower pole of the left kidney highly  concerning for renal cell carcinoma. At this time, the lesion appears likely to be encapsulated within Gerota's fascia (although it comes very close to the left psoas and quadratus lumborum musculature), does not involve the left renal vein, and does not appear to be associated with lymphadenopathy. Nonemergent Urologic consultation for surgical resection is strongly recommended in the near future. 3. 1.2 x 1.4 cm indeterminate nodule in the left adrenal gland. Attention on followup studies is recommended, as a metastatic lesion is not excluded. 4. 4 mm subpleural nodules in the right middle lobe and left lower lobe. These are highly nonspecific, and favored to represent subpleural lymph nodes, but attention on followup studies is recommended to ensure stability. 5. Cholelithiasis without evidence of acute cholecystitis at this time. 6. Atherosclerosis, including left main and 3 vessel coronary artery disease. Please note that although the presence of coronary artery calcium documents the presence of coronary artery disease, the severity of this disease and any potential stenosis cannot be assessed on this non-gated CT examination. Assessment for potential risk factor modification, dietary therapy or pharmacologic therapy may be warranted, if clinically  indicated. 7. There are calcifications of the aortic valve and mitral valve/annulus. Echocardiographic correlation for evaluation of potential valvular dysfunction may be warranted if clinically indicated. 8. Colonic diverticulosis without evidence of acute diverticulitis at this time. 9. Additional incidental findings, as above. These results were called by telephone at the time of interpretation on 08/29/2015 at 7:34 pm to Dr. Davonna Belling, who verbally acknowledged these results. Electronically Signed   By: Vinnie Langton M.D.   On: 08/29/2015 19:37   Ct Abdomen Pelvis W Contrast  08/29/2015  CLINICAL DATA:  60 year old female with 2-3 day history of  vomiting. Abdominal pain for the past month. Loss of voice. EXAM: CT CHEST, ABDOMEN, AND PELVIS WITH CONTRAST TECHNIQUE: Multidetector CT imaging of the chest, abdomen and pelvis was performed following the standard protocol during bolus administration of intravenous contrast. CONTRAST:  7mL OMNIPAQUE IOHEXOL 300 MG/ML  SOLN COMPARISON:  No priors. FINDINGS: CT CHEST FINDINGS Mediastinum/Lymph Nodes: Heart size is normal. There is no significant pericardial fluid, thickening or pericardial calcification. There is atherosclerosis of the thoracic aorta, the great vessels of the mediastinum and the coronary arteries, including calcified atherosclerotic plaque in the left main, left anterior descending, left circumflex and right coronary arteries. Calcifications of the aortic valve and mitral valve/annulus. No pathologically enlarged mediastinal or hilar lymph nodes. Esophagus is unremarkable in appearance. No axillary lymphadenopathy. Lungs/Pleura: 4 mm subpleural nodule in the lateral segment of the right middle lobe (image 31 of series 4). 4 mm subpleural nodule in the posterior left lower lobe (image 38 of series 4). No other suspicious appearing pulmonary nodules or masses. No acute consolidative airspace disease. No pleural effusions. Musculoskeletal/Soft Tissues: There are no aggressive appearing lytic or blastic lesions noted in the visualized portions of the skeleton. CT ABDOMEN AND PELVIS FINDINGS Hepatobiliary: No cystic or solid hepatic lesions. No intra or extrahepatic biliary ductal dilatation. 7 mm calcified gallstone lying dependently in the gallbladder. No current findings to suggest an acute cholecystitis at this time. Pancreas: No pancreatic mass. No pancreatic ductal dilatation. No pancreatic or peripancreatic fluid or inflammatory changes. Spleen: Unremarkable. Adrenals/Urinary Tract: In the lower pole of the left kidney there is a 5.9 x 6.1 x 6.3 cm heterogeneously enhancing lesion highly  concerning for renal cell carcinoma. The inferior aspect of this lesion comes in very close proximity to the anterior aspect of the left psoas muscle and quadratus lumborum muscle, however, there appears to be an intervening fat plane at this time. The lesion is well separated from the left renal vein. Right kidney and right adrenal gland are normal in appearance. 1.2 x 1.4 cm indeterminate nodule in the lateral limb of the left adrenal gland. No hydroureteronephrosis. Urinary bladder is largely decompressed, but otherwise unremarkable in appearance. Stomach/Bowel: Normal appearance of the stomach. No pathologic dilatation of small bowel or colon. A few scattered colonic diverticulae are noted, without surrounding inflammatory changes to suggest an acute diverticulitis at this time. Appendix is normal. Vascular/Lymphatic: Atherosclerosis throughout the abdominal and pelvic vasculature, without evidence of aneurysm or dissection. Left renal vein is widely patent and separate from the lower pole mass. No lymphadenopathy noted in the abdomen or pelvis. Reproductive: Uterus and ovaries are unremarkable in appearance. Other: No significant volume of ascites.  No pneumoperitoneum. Musculoskeletal: There are no aggressive appearing lytic or blastic lesions noted in the visualized portions of the skeleton. IMPRESSION: 1. No acute findings in the abdomen or pelvis to account for the patient's symptoms. 2. However, there is a 5.9  x 6.1 x 6.3 cm heterogeneously enhancing mass in the lower pole of the left kidney highly concerning for renal cell carcinoma. At this time, the lesion appears likely to be encapsulated within Gerota's fascia (although it comes very close to the left psoas and quadratus lumborum musculature), does not involve the left renal vein, and does not appear to be associated with lymphadenopathy. Nonemergent Urologic consultation for surgical resection is strongly recommended in the near future. 3. 1.2 x 1.4  cm indeterminate nodule in the left adrenal gland. Attention on followup studies is recommended, as a metastatic lesion is not excluded. 4. 4 mm subpleural nodules in the right middle lobe and left lower lobe. These are highly nonspecific, and favored to represent subpleural lymph nodes, but attention on followup studies is recommended to ensure stability. 5. Cholelithiasis without evidence of acute cholecystitis at this time. 6. Atherosclerosis, including left main and 3 vessel coronary artery disease. Please note that although the presence of coronary artery calcium documents the presence of coronary artery disease, the severity of this disease and any potential stenosis cannot be assessed on this non-gated CT examination. Assessment for potential risk factor modification, dietary therapy or pharmacologic therapy may be warranted, if clinically indicated. 7. There are calcifications of the aortic valve and mitral valve/annulus. Echocardiographic correlation for evaluation of potential valvular dysfunction may be warranted if clinically indicated. 8. Colonic diverticulosis without evidence of acute diverticulitis at this time. 9. Additional incidental findings, as above. These results were called by telephone at the time of interpretation on 08/29/2015 at 7:34 pm to Dr. Davonna Belling, who verbally acknowledged these results. Electronically Signed   By: Vinnie Langton M.D.   On: 08/29/2015 19:37    Oren Binet, MD  [facility] Pager:[phone]  If 7PM-7AM, please contact night-coverage [email or web address] Password [facility] 08/30/2015, 2:44 PM

## 2015-08-30 NOTE — Consult Note (Signed)
Reason for Consult: Hematochezia Referring Physician: [facility]  Arne Cleveland HPI: This is a 60 year old female with a PMH of HTN and DM admitted for weight loss, hoarseness, and dysphagia.  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  She has a history of smoking and she drinks a 1/5 of liquor per week.  She has issues with dysphagia to solid and liquids, but there is a solid food predominance.  Today she had an episode of hematochezia, which was painless.  Diverticula were noted on the CT scan and she also has a left renal mass.  Her HGB has dropped down to the 7 range from 10.9 on admission.  Her last CBC was 08/17/2013 and her HGB was at 12.8 g/dL.  She denies a prior colonoscopy and she does not have a family history of colon cancer.  Past Medical History  Diagnosis Date  . Thyroid disease   . Hypertension   . Neck mass hospitalized 08/29/2015  . Type II diabetes mellitus [facility])     Past Surgical History  Procedure Laterality Date  . No past surgeries      Family History  Problem Relation Age of Onset  . Diabetes Mother   . Hypertension Mother   . Heart disease Mother   . Diabetes Sister   . Hypertension Sister   . Cancer Daughter     Stomach    Social History:  reports that she quit smoking about 6 weeks ago. Her smoking use included Cigarettes. She has a 17 pack-year smoking history. She has never used smokeless tobacco. She reports that she drinks about 15.0 oz of alcohol per week. She reports that she does not use illicit drugs.  Allergies: No Known Allergies  Medications:  Scheduled: . sodium chloride   Intravenous Once  . cefTRIAXone (ROCEPHIN)  IV  1 g Intravenous Q24H  . folic acid  1 mg Oral Daily  . Influenza vac split quadrivalent PF  0.5 mL Intramuscular Tomorrow-1000  . insulin aspart  0-9 Units Subcutaneous TID WC  . magnesium sulfate 1 - 4 g bolus IVPB  2 g Intravenous Once  . multivitamin with minerals  1 tablet Oral Daily  . pantoprazole  (PROTONIX) IV  40 mg Intravenous Q12H  . potassium chloride  40 mEq Oral BID  . thiamine  100 mg Oral Daily   Or  . thiamine  100 mg Intravenous Daily   Continuous: . sodium chloride 75 mL/hr at 08/30/15 1720    Results for orders placed or performed during the hospital encounter of 08/29/15 (from the past 24 hour(s))  Urinalysis, Routine w reflex microscopic (not at [facility])     Status: Abnormal   Collection Time: 08/29/15  9:09 PM  Result Value Ref Range   Color, Urine YELLOW YELLOW   APPearance CLOUDY (A) CLEAR   Specific Gravity, Urine 1.025 1.005 - 1.030   pH 5.0 5.0 - 8.0   Glucose, UA NEGATIVE NEGATIVE mg/dL   Hgb urine dipstick MODERATE (A) NEGATIVE   Bilirubin Urine MODERATE (A) NEGATIVE   Ketones, ur 15 (A) NEGATIVE mg/dL   Protein, ur NEGATIVE NEGATIVE mg/dL   Nitrite NEGATIVE NEGATIVE   Leukocytes, UA MODERATE (A) NEGATIVE  Urine microscopic-add on     Status: Abnormal   Collection Time: 08/29/15  9:09 PM  Result Value Ref Range   Squamous Epithelial / LPF 0-5 (A) NONE SEEN   WBC, UA TOO NUMEROUS TO COUNT 0 - 5 WBC/hpf  RBC / HPF 6-30 0 - 5 RBC/hpf   Bacteria, UA MANY (A) NONE SEEN   Casts HYALINE CASTS (A) NEGATIVE   Urine-Other TRICHOMONAS PRESENT   Lactic acid, plasma     Status: None   Collection Time: 08/29/15 10:52 PM  Result Value Ref Range   Lactic Acid, Venous 1.2 0.5 - 2.0 mmol/L  Creatinine, urine, random     Status: None   Collection Time: 08/29/15 11:07 PM  Result Value Ref Range   Creatinine, Urine 164.04 mg/dL  Sodium, urine, random     Status: None   Collection Time: 08/29/15 11:07 PM  Result Value Ref Range   Sodium, Ur 32 mmol/L  Basic metabolic panel     Status: Abnormal   Collection Time: 08/30/15  1:42 AM  Result Value Ref Range   Sodium 137 135 - 145 mmol/L   Potassium 2.9 (L) 3.5 - 5.1 mmol/L   Chloride 96 (L) 101 - 111 mmol/L   CO2 31 22 - 32 mmol/L   Glucose, Bld 165 (H) 65 - 99 mg/dL   BUN 56 (H) 6 - 20 mg/dL   Creatinine, Ser  1.46 (H) 0.44 - 1.00 mg/dL   Calcium 8.3 (L) 8.9 - 10.3 mg/dL   GFR calc non Af Amer 38 (L) >60 mL/min   GFR calc Af Amer 44 (L) >60 mL/min   Anion gap 10 5 - 15  CBC     Status: Abnormal   Collection Time: 08/30/15  1:42 AM  Result Value Ref Range   WBC 20.4 (H) 4.0 - 10.5 K/uL   RBC 3.39 (L) 3.87 - 5.11 MIL/uL   Hemoglobin 8.8 (L) 12.0 - 15.0 g/dL   HCT 27.6 (L) 36.0 - 46.0 %   MCV 81.4 78.0 - 100.0 fL   MCH 26.0 26.0 - 34.0 pg   MCHC 31.9 30.0 - 36.0 g/dL   RDW 13.7 11.5 - 15.5 %   Platelets 217 150 - 400 K/uL  Lactic acid, plasma     Status: None   Collection Time: 08/30/15  1:42 AM  Result Value Ref Range   Lactic Acid, Venous 1.0 0.5 - 2.0 mmol/L  Magnesium     Status: Abnormal   Collection Time: 08/30/15  1:42 AM  Result Value Ref Range   Magnesium 1.5 (L) 1.7 - 2.4 mg/dL  CBC     Status: Abnormal   Collection Time: 08/30/15  2:48 PM  Result Value Ref Range   WBC 20.2 (H) 4.0 - 10.5 K/uL   RBC 3.05 (L) 3.87 - 5.11 MIL/uL   Hemoglobin 7.8 (L) 12.0 - 15.0 g/dL   HCT 24.9 (L) 36.0 - 46.0 %   MCV 81.6 78.0 - 100.0 fL   MCH 25.6 (L) 26.0 - 34.0 pg   MCHC 31.3 30.0 - 36.0 g/dL   RDW 13.6 11.5 - 15.5 %   Platelets 224 150 - 400 K/uL  Reticulocytes     Status: Abnormal   Collection Time: 08/30/15  4:30 PM  Result Value Ref Range   Retic Ct Pct 1.5 0.4 - 3.1 %   RBC. 2.84 (L) 3.87 - 5.11 MIL/uL   Retic Count, Manual 42.6 19.0 - 186.0 K/uL  Prepare RBC     Status: None   Collection Time: 08/30/15  5:00 PM  Result Value Ref Range   Order Confirmation ORDER PROCESSED BY BLOOD BANK   Glucose, capillary     Status: Abnormal   Collection Time: 08/30/15  5:40 PM  Result Value Ref Range   Glucose-Capillary 144 (H) 65 - 99 mg/dL     Dg Chest 2 View  08/29/2015  CLINICAL DATA:  One month history of chest pain and hoarse voice. EXAM: CHEST  2 VIEW COMPARISON:  None. FINDINGS: The heart is borderline and enlarged. Mild tortuosity of the thoracic aorta. Low lung volumes with  mild vascular crowding and streaky basilar atelectasis. There also bronchitic changes which could be acute or chronic. No infiltrates or effusions. The bony thorax is intact. IMPRESSION: Acute versus chronic bronchitic change.  No infiltrates or effusions Electronically Signed   By: Marijo Sanes M.D.   On: 08/29/2015 16:15   Ct Soft Tissue Neck W Contrast  08/29/2015  CLINICAL DATA:  60 year old female with abnormal full waist for 1 month. Abdominal pain. Three days of vomiting. Initial encounter. EXAM: CT NECK WITH CONTRAST TECHNIQUE: Multidetector CT imaging of the neck was performed using the standard protocol following the bolus administration of intravenous contrast. CONTRAST:  30mL OMNIPAQUE IOHEXOL 300 MG/ML SOLN in conjunction with contrast enhanced imaging of the chest, abdomen, and pelvis reported separately. COMPARISON:  CT chest abdomen and pelvis from today reported separately FINDINGS: Pharynx and larynx: Bulky supraglottic laryngeal tumor with marked soft tissue enlargement of the bilateral aryepiglottic folds and anterior commissure up to 17 mm in thickness. Tumor appears inseparable from the undersurface of the strap muscles suggesting extension through the thyroid cartilage bilaterally. Posterior hypopharynx involvement. The epiglottis is thickened, nodular, and distorted. Furthermore, the vallecula is mostly effaced and soft tissue at the base of tongue appears nodular and thickened. All told, tumor encompasses 37 x 40 x 58 mm (AP by transverse by CC). The other laryngeal cartilages appear spared. The palatine tonsils, soft palate, and nasopharynx appear within normal limits. Superior parapharyngeal spaces and retropharyngeal space are within normal limits. Salivary glands: Sublingual space, submandibular glands, and parotid glands are within normal limits. Thyroid: Coarsely calcified 2.3 cm right thyroid nodule. Subcentimeter left lobe nodule. Lymph nodes: Abnormal right level 3 node  measuring 9 mm at the level of the thyroid cartilage (series 1, image 57). Small but asymmetric 5 mm right level IIIa lymph node at the level of the hyoid bone on the right (series 1, image 49). Bilateral level 2A nodes appear symmetric measuring 8-9 mm in thickness. No level 4, 5, or level 1 lymphadenopathy. Vascular: Suboptimal intravascular contrast timing, but major vascular structures in the neck and at the skullbase appear to remain patent. Left greater than right carotid bifurcation calcified atherosclerosis. Limited intracranial: Negative.  Partially empty sella. Visualized orbits: Negative. Mastoids and visualized paranasal sinuses: Visualized paranasal sinuses and mastoids are clear. Skeleton: Mostly absent dentition. Periapical lucency about the residual left mandible molar. No osseous metastatic disease identified. Upper chest: Reported separately today. IMPRESSION: 1. 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 2. Suspect metastatic nodal disease at level 3 on the right (small but asymmetric up to 9 mm nodes). Bilateral level 2 nodes are indeterminate and also measure up to 9 mm individually. No cystic or necrotic nodes in the neck. 3. See CT chest abdomen and pelvis from today reported separately. Electronically Signed   By: Genevie Ann M.D.   On: 08/29/2015 19:56   Ct Chest W Contrast  08/29/2015  CLINICAL DATA:  60 year old female with 2-3 day history of vomiting. Abdominal pain for the past month. Loss of voice. EXAM: CT CHEST, ABDOMEN, AND  PELVIS WITH CONTRAST TECHNIQUE: Multidetector CT imaging of the chest, abdomen and pelvis was performed following the standard protocol during bolus administration of intravenous contrast. CONTRAST:  35mL OMNIPAQUE IOHEXOL 300 MG/ML  SOLN COMPARISON:  No priors. FINDINGS: CT CHEST FINDINGS Mediastinum/Lymph Nodes: Heart size is normal. There is no significant pericardial  fluid, thickening or pericardial calcification. There is atherosclerosis of the thoracic aorta, the great vessels of the mediastinum and the coronary arteries, including calcified atherosclerotic plaque in the left main, left anterior descending, left circumflex and right coronary arteries. Calcifications of the aortic valve and mitral valve/annulus. No pathologically enlarged mediastinal or hilar lymph nodes. Esophagus is unremarkable in appearance. No axillary lymphadenopathy. Lungs/Pleura: 4 mm subpleural nodule in the lateral segment of the right middle lobe (image 31 of series 4). 4 mm subpleural nodule in the posterior left lower lobe (image 38 of series 4). No other suspicious appearing pulmonary nodules or masses. No acute consolidative airspace disease. No pleural effusions. Musculoskeletal/Soft Tissues: There are no aggressive appearing lytic or blastic lesions noted in the visualized portions of the skeleton. CT ABDOMEN AND PELVIS FINDINGS Hepatobiliary: No cystic or solid hepatic lesions. No intra or extrahepatic biliary ductal dilatation. 7 mm calcified gallstone lying dependently in the gallbladder. No current findings to suggest an acute cholecystitis at this time. Pancreas: No pancreatic mass. No pancreatic ductal dilatation. No pancreatic or peripancreatic fluid or inflammatory changes. Spleen: Unremarkable. Adrenals/Urinary Tract: In the lower pole of the left kidney there is a 5.9 x 6.1 x 6.3 cm heterogeneously enhancing lesion highly concerning for renal cell carcinoma. The inferior aspect of this lesion comes in very close proximity to the anterior aspect of the left psoas muscle and quadratus lumborum muscle, however, there appears to be an intervening fat plane at this time. The lesion is well separated from the left renal vein. Right kidney and right adrenal gland are normal in appearance. 1.2 x 1.4 cm indeterminate nodule in the lateral limb of the left adrenal gland. No  hydroureteronephrosis. Urinary bladder is largely decompressed, but otherwise unremarkable in appearance. Stomach/Bowel: Normal appearance of the stomach. No pathologic dilatation of small bowel or colon. A few scattered colonic diverticulae are noted, without surrounding inflammatory changes to suggest an acute diverticulitis at this time. Appendix is normal. Vascular/Lymphatic: Atherosclerosis throughout the abdominal and pelvic vasculature, without evidence of aneurysm or dissection. Left renal vein is widely patent and separate from the lower pole mass. No lymphadenopathy noted in the abdomen or pelvis. Reproductive: Uterus and ovaries are unremarkable in appearance. Other: No significant volume of ascites.  No pneumoperitoneum. Musculoskeletal: There are no aggressive appearing lytic or blastic lesions noted in the visualized portions of the skeleton. IMPRESSION: 1. No acute findings in the abdomen or pelvis to account for the patient's symptoms. 2. However, there is a 5.9 x 6.1 x 6.3 cm heterogeneously enhancing mass in the lower pole of the left kidney highly concerning for renal cell carcinoma. At this time, the lesion appears likely to be encapsulated within Gerota's fascia (although it comes very close to the left psoas and quadratus lumborum musculature), does not involve the left renal vein, and does not appear to be associated with lymphadenopathy. Nonemergent Urologic consultation for surgical resection is strongly recommended in the near future. 3. 1.2 x 1.4 cm indeterminate nodule in the left adrenal gland. Attention on followup studies is recommended, as a metastatic lesion is not excluded. 4. 4 mm subpleural nodules in the right middle lobe and left lower lobe. These  are highly nonspecific, and favored to represent subpleural lymph nodes, but attention on followup studies is recommended to ensure stability. 5. Cholelithiasis without evidence of acute cholecystitis at this time. 6. Atherosclerosis,  including left main and 3 vessel coronary artery disease. Please note that although the presence of coronary artery calcium documents the presence of coronary artery disease, the severity of this disease and any potential stenosis cannot be assessed on this non-gated CT examination. Assessment for potential risk factor modification, dietary therapy or pharmacologic therapy may be warranted, if clinically indicated. 7. There are calcifications of the aortic valve and mitral valve/annulus. Echocardiographic correlation for evaluation of potential valvular dysfunction may be warranted if clinically indicated. 8. Colonic diverticulosis without evidence of acute diverticulitis at this time. 9. Additional incidental findings, as above. These results were called by telephone at the time of interpretation on 08/29/2015 at 7:34 pm to Dr. Davonna Belling, who verbally acknowledged these results. Electronically Signed   By: Vinnie Langton M.D.   On: 08/29/2015 19:37   Ct Abdomen Pelvis W Contrast  08/29/2015  CLINICAL DATA:  60 year old female with 2-3 day history of vomiting. Abdominal pain for the past month. Loss of voice. EXAM: CT CHEST, ABDOMEN, AND PELVIS WITH CONTRAST TECHNIQUE: Multidetector CT imaging of the chest, abdomen and pelvis was performed following the standard protocol during bolus administration of intravenous contrast. CONTRAST:  47mL OMNIPAQUE IOHEXOL 300 MG/ML  SOLN COMPARISON:  No priors. FINDINGS: CT CHEST FINDINGS Mediastinum/Lymph Nodes: Heart size is normal. There is no significant pericardial fluid, thickening or pericardial calcification. There is atherosclerosis of the thoracic aorta, the great vessels of the mediastinum and the coronary arteries, including calcified atherosclerotic plaque in the left main, left anterior descending, left circumflex and right coronary arteries. Calcifications of the aortic valve and mitral valve/annulus. No pathologically enlarged mediastinal or hilar lymph  nodes. Esophagus is unremarkable in appearance. No axillary lymphadenopathy. Lungs/Pleura: 4 mm subpleural nodule in the lateral segment of the right middle lobe (image 31 of series 4). 4 mm subpleural nodule in the posterior left lower lobe (image 38 of series 4). No other suspicious appearing pulmonary nodules or masses. No acute consolidative airspace disease. No pleural effusions. Musculoskeletal/Soft Tissues: There are no aggressive appearing lytic or blastic lesions noted in the visualized portions of the skeleton. CT ABDOMEN AND PELVIS FINDINGS Hepatobiliary: No cystic or solid hepatic lesions. No intra or extrahepatic biliary ductal dilatation. 7 mm calcified gallstone lying dependently in the gallbladder. No current findings to suggest an acute cholecystitis at this time. Pancreas: No pancreatic mass. No pancreatic ductal dilatation. No pancreatic or peripancreatic fluid or inflammatory changes. Spleen: Unremarkable. Adrenals/Urinary Tract: In the lower pole of the left kidney there is a 5.9 x 6.1 x 6.3 cm heterogeneously enhancing lesion highly concerning for renal cell carcinoma. The inferior aspect of this lesion comes in very close proximity to the anterior aspect of the left psoas muscle and quadratus lumborum muscle, however, there appears to be an intervening fat plane at this time. The lesion is well separated from the left renal vein. Right kidney and right adrenal gland are normal in appearance. 1.2 x 1.4 cm indeterminate nodule in the lateral limb of the left adrenal gland. No hydroureteronephrosis. Urinary bladder is largely decompressed, but otherwise unremarkable in appearance. Stomach/Bowel: Normal appearance of the stomach. No pathologic dilatation of small bowel or colon. A few scattered colonic diverticulae are noted, without surrounding inflammatory changes to suggest an acute diverticulitis at this time. Appendix is normal. Vascular/Lymphatic:  Atherosclerosis throughout the abdominal and  pelvic vasculature, without evidence of aneurysm or dissection. Left renal vein is widely patent and separate from the lower pole mass. No lymphadenopathy noted in the abdomen or pelvis. Reproductive: Uterus and ovaries are unremarkable in appearance. Other: No significant volume of ascites.  No pneumoperitoneum. Musculoskeletal: There are no aggressive appearing lytic or blastic lesions noted in the visualized portions of the skeleton. IMPRESSION: 1. No acute findings in the abdomen or pelvis to account for the patient's symptoms. 2. However, there is a 5.9 x 6.1 x 6.3 cm heterogeneously enhancing mass in the lower pole of the left kidney highly concerning for renal cell carcinoma. At this time, the lesion appears likely to be encapsulated within Gerota's fascia (although it comes very close to the left psoas and quadratus lumborum musculature), does not involve the left renal vein, and does not appear to be associated with lymphadenopathy. Nonemergent Urologic consultation for surgical resection is strongly recommended in the near future. 3. 1.2 x 1.4 cm indeterminate nodule in the left adrenal gland. Attention on followup studies is recommended, as a metastatic lesion is not excluded. 4. 4 mm subpleural nodules in the right middle lobe and left lower lobe. These are highly nonspecific, and favored to represent subpleural lymph nodes, but attention on followup studies is recommended to ensure stability. 5. Cholelithiasis without evidence of acute cholecystitis at this time. 6. Atherosclerosis, including left main and 3 vessel coronary artery disease. Please note that although the presence of coronary artery calcium documents the presence of coronary artery disease, the severity of this disease and any potential stenosis cannot be assessed on this non-gated CT examination. Assessment for potential risk factor modification, dietary therapy or pharmacologic therapy may be warranted, if clinically indicated. 7. There  are calcifications of the aortic valve and mitral valve/annulus. Echocardiographic correlation for evaluation of potential valvular dysfunction may be warranted if clinically indicated. 8. Colonic diverticulosis without evidence of acute diverticulitis at this time. 9. Additional incidental findings, as above. These results were called by telephone at the time of interpretation on 08/29/2015 at 7:34 pm to Dr. Davonna Belling, who verbally acknowledged these results. Electronically Signed   By: Vinnie Langton M.D.   On: 08/29/2015 19:37    ROS:  As stated above in the HPI otherwise negative.  Blood pressure 90/75, pulse 102, temperature 97.9 F (36.6 C), temperature source Oral, resp. rate 16, height 5\' 3"  (1.6 m), weight 113.399 kg (250 lb), SpO2 96 %.    PE: Gen: NAD, Alert and Oriented HEENT:  [place]/AT, EOMI Neck: Supple, no LAD Lungs: CTA Bilaterally CV: RRR without M/G/R ABM: Soft, NTND, +BS Ext: No C/C/E Rectal: declined.  Assessment/Plan: 1) Hematochezia. 2) Anemia. 3) Laryngeal mass. 4) Left renal mass.   The laryngoscopy reveals a supraglottic laryngeal and hypopharyngeal mass without airway obstruction, however, the cords were not visible.  She is able to swallow, but slowly.  With the ENT findings her airway is tenuous, even though there is no breathing issue at this time.  She would be high risk of respiratory complications with a colonoscopy.  Additionally, she will not be able to prep well as she cannot drink the prep fast enough.  Overall the pressing issues are the laryngneal mass and the renal mass.  She has an anemia, but I cannot discern if this is from the other findings.  She declined a rectal examination at this time.  Plan: 1) ENT evaluation and treatment. 2) Hold on any GI endoscopic  procedures. 3) Signing off.  Blease Capaldi D 08/30/2015, 5:54 PM

## 2015-08-30 NOTE — Progress Notes (Signed)
pts bowel movement was dark red in color with a strong odor. Made Dr Sloan Leiter aware.

## 2015-08-30 NOTE — Consult Note (Signed)
Reason for Consult:Laryngeal mass Referring Physician: Jonetta Osgood, MD  Ashley Ramsey is an 60 y.o. female.  HPI: 4 month history of progressively worsening dysphagia, and loss of voice. Admitted late yesterday due to inability to adequately take in po liquids. She has lost about 200 pounds in the past year or so. She is a long time smoker and drinker and has quit smoking about a month ago.  Past Medical History  Diagnosis Date  . Thyroid disease   . Hypertension   . Neck mass hospitalized 08/29/2015  . Type II diabetes mellitus [facility])     Past Surgical History  Procedure Laterality Date  . No past surgeries      Family History  Problem Relation Age of Onset  . Diabetes Mother   . Hypertension Mother   . Heart disease Mother   . Diabetes Sister   . Hypertension Sister   . Cancer Daughter     Stomach    Social History:  reports that she quit smoking about 6 weeks ago. Her smoking use included Cigarettes. She has a 17 pack-year smoking history. She has never used smokeless tobacco. She reports that she drinks about 15.0 oz of alcohol per week. She reports that she does not use illicit drugs.  Allergies: No Known Allergies  Medications: Reviewed  Results for orders placed or performed during the hospital encounter of 08/29/15 (from the past 48 hour(s))  Lipase, blood     Status: None   Collection Time: 08/29/15  2:46 PM  Result Value Ref Range   Lipase 22 11 - 51 U/L  Comprehensive metabolic panel     Status: Abnormal   Collection Time: 08/29/15  2:46 PM  Result Value Ref Range   Sodium 138 135 - 145 mmol/L   Potassium 3.1 (L) 3.5 - 5.1 mmol/L   Chloride 96 (L) 101 - 111 mmol/L   CO2 27 22 - 32 mmol/L   Glucose, Bld 166 (H) 65 - 99 mg/dL   BUN 53 (H) 6 - 20 mg/dL   Creatinine, Ser 1.47 (H) 0.44 - 1.00 mg/dL   Calcium 9.1 8.9 - 10.3 mg/dL   Total Protein 7.3 6.5 - 8.1 g/dL   Albumin 2.9 (L) 3.5 - 5.0 g/dL   AST 13 (L) 15 - 41 U/L   ALT 13 (L) 14 - 54 U/L    Alkaline Phosphatase 88 38 - 126 U/L   Total Bilirubin 0.4 0.3 - 1.2 mg/dL   GFR calc non Af Amer 38 (L) >60 mL/min   GFR calc Af Amer 44 (L) >60 mL/min    Comment: (NOTE) The eGFR has been calculated using the CKD EPI equation. This calculation has not been validated in all clinical situations. eGFR's persistently <60 mL/min signify possible Chronic Kidney Disease.    Anion gap 15 5 - 15  CBC     Status: Abnormal   Collection Time: 08/29/15  2:46 PM  Result Value Ref Range   WBC 24.8 (H) 4.0 - 10.5 K/uL   RBC 4.25 3.87 - 5.11 MIL/uL   Hemoglobin 10.9 (L) 12.0 - 15.0 g/dL   HCT 34.8 (L) 36.0 - 46.0 %   MCV 81.9 78.0 - 100.0 fL   MCH 25.6 (L) 26.0 - 34.0 pg   MCHC 31.3 30.0 - 36.0 g/dL   RDW 13.5 11.5 - 15.5 %   Platelets 287 150 - 400 K/uL  Urinalysis, Routine w reflex microscopic (not at [facility])     Status: Abnormal  Collection Time: 08/29/15  9:09 PM  Result Value Ref Range   Color, Urine YELLOW YELLOW   APPearance CLOUDY (A) CLEAR   Specific Gravity, Urine 1.025 1.005 - 1.030   pH 5.0 5.0 - 8.0   Glucose, UA NEGATIVE NEGATIVE mg/dL   Hgb urine dipstick MODERATE (A) NEGATIVE   Bilirubin Urine MODERATE (A) NEGATIVE   Ketones, ur 15 (A) NEGATIVE mg/dL   Protein, ur NEGATIVE NEGATIVE mg/dL   Nitrite NEGATIVE NEGATIVE   Leukocytes, UA MODERATE (A) NEGATIVE  Urine microscopic-add on     Status: Abnormal   Collection Time: 08/29/15  9:09 PM  Result Value Ref Range   Squamous Epithelial / LPF 0-5 (A) NONE SEEN   WBC, UA TOO NUMEROUS TO COUNT 0 - 5 WBC/hpf   RBC / HPF 6-30 0 - 5 RBC/hpf   Bacteria, UA MANY (A) NONE SEEN   Casts HYALINE CASTS (A) NEGATIVE   Urine-Other TRICHOMONAS PRESENT   Lactic acid, plasma     Status: None   Collection Time: 08/29/15 10:52 PM  Result Value Ref Range   Lactic Acid, Venous 1.2 0.5 - 2.0 mmol/L  Creatinine, urine, random     Status: None   Collection Time: 08/29/15 11:07 PM  Result Value Ref Range   Creatinine, Urine 164.04 mg/dL   Sodium, urine, random     Status: None   Collection Time: 08/29/15 11:07 PM  Result Value Ref Range   Sodium, Ur 32 mmol/L  Basic metabolic panel     Status: Abnormal   Collection Time: 08/30/15  1:42 AM  Result Value Ref Range   Sodium 137 135 - 145 mmol/L   Potassium 2.9 (L) 3.5 - 5.1 mmol/L   Chloride 96 (L) 101 - 111 mmol/L   CO2 31 22 - 32 mmol/L   Glucose, Bld 165 (H) 65 - 99 mg/dL   BUN 56 (H) 6 - 20 mg/dL   Creatinine, Ser 1.46 (H) 0.44 - 1.00 mg/dL   Calcium 8.3 (L) 8.9 - 10.3 mg/dL   GFR calc non Af Amer 38 (L) >60 mL/min   GFR calc Af Amer 44 (L) >60 mL/min    Comment: (NOTE) The eGFR has been calculated using the CKD EPI equation. This calculation has not been validated in all clinical situations. eGFR's persistently <60 mL/min signify possible Chronic Kidney Disease.    Anion gap 10 5 - 15  CBC     Status: Abnormal   Collection Time: 08/30/15  1:42 AM  Result Value Ref Range   WBC 20.4 (H) 4.0 - 10.5 K/uL   RBC 3.39 (L) 3.87 - 5.11 MIL/uL   Hemoglobin 8.8 (L) 12.0 - 15.0 g/dL   HCT 27.6 (L) 36.0 - 46.0 %   MCV 81.4 78.0 - 100.0 fL   MCH 26.0 26.0 - 34.0 pg   MCHC 31.9 30.0 - 36.0 g/dL   RDW 13.7 11.5 - 15.5 %   Platelets 217 150 - 400 K/uL  Lactic acid, plasma     Status: None   Collection Time: 08/30/15  1:42 AM  Result Value Ref Range   Lactic Acid, Venous 1.0 0.5 - 2.0 mmol/L  Magnesium     Status: Abnormal   Collection Time: 08/30/15  1:42 AM  Result Value Ref Range   Magnesium 1.5 (L) 1.7 - 2.4 mg/dL    Dg Chest 2 View  08/29/2015  CLINICAL DATA:  One month history of chest pain and hoarse voice. EXAM: CHEST  2 VIEW COMPARISON:  None. FINDINGS: The heart is borderline and enlarged. Mild tortuosity of the thoracic aorta. Low lung volumes with mild vascular crowding and streaky basilar atelectasis. There also bronchitic changes which could be acute or chronic. No infiltrates or effusions. The bony thorax is intact. IMPRESSION: Acute versus chronic  bronchitic change.  No infiltrates or effusions Electronically Signed   By: Marijo Sanes M.D.   On: 08/29/2015 16:15   Ct Soft Tissue Neck W Contrast  08/29/2015  CLINICAL DATA:  60 year old female with abnormal full waist for 1 month. Abdominal pain. Three days of vomiting. Initial encounter. EXAM: CT NECK WITH CONTRAST TECHNIQUE: Multidetector CT imaging of the neck was performed using the standard protocol following the bolus administration of intravenous contrast. CONTRAST:  63m OMNIPAQUE IOHEXOL 300 MG/ML SOLN in conjunction with contrast enhanced imaging of the chest, abdomen, and pelvis reported separately. COMPARISON:  CT chest abdomen and pelvis from today reported separately FINDINGS: Pharynx and larynx: Bulky supraglottic laryngeal tumor with marked soft tissue enlargement of the bilateral aryepiglottic folds and anterior commissure up to 17 mm in thickness. Tumor appears inseparable from the undersurface of the strap muscles suggesting extension through the thyroid cartilage bilaterally. Posterior hypopharynx involvement. The epiglottis is thickened, nodular, and distorted. Furthermore, the vallecula is mostly effaced and soft tissue at the base of tongue appears nodular and thickened. All told, tumor encompasses 37 x 40 x 58 mm (AP by transverse by CC). The other laryngeal cartilages appear spared. The palatine tonsils, soft palate, and nasopharynx appear within normal limits. Superior parapharyngeal spaces and retropharyngeal space are within normal limits. Salivary glands: Sublingual space, submandibular glands, and parotid glands are within normal limits. Thyroid: Coarsely calcified 2.3 cm right thyroid nodule. Subcentimeter left lobe nodule. Lymph nodes: Abnormal right level 3 node measuring 9 mm at the level of the thyroid cartilage (series 1, image 57). Small but asymmetric 5 mm right level IIIa lymph node at the level of the hyoid bone on the right (series 1, image 49). Bilateral level 2A  nodes appear symmetric measuring 8-9 mm in thickness. No level 4, 5, or level 1 lymphadenopathy. Vascular: Suboptimal intravascular contrast timing, but major vascular structures in the neck and at the skullbase appear to remain patent. Left greater than right carotid bifurcation calcified atherosclerosis. Limited intracranial: Negative.  Partially empty sella. Visualized orbits: Negative. Mastoids and visualized paranasal sinuses: Visualized paranasal sinuses and mastoids are clear. Skeleton: Mostly absent dentition. Periapical lucency about the residual left mandible molar. No osseous metastatic disease identified. Upper chest: Reported separately today. IMPRESSION: 1. 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 2. Suspect metastatic nodal disease at level 3 on the right (small but asymmetric up to 9 mm nodes). Bilateral level 2 nodes are indeterminate and also measure up to 9 mm individually. No cystic or necrotic nodes in the neck. 3. See CT chest abdomen and pelvis from today reported separately. Electronically Signed   By: HGenevie AnnM.D.   On: 08/29/2015 19:56   Ct Chest W Contrast  08/29/2015  CLINICAL DATA:  60year old female with 2-3 day history of vomiting. Abdominal pain for the past month. Loss of voice. EXAM: CT CHEST, ABDOMEN, AND PELVIS WITH CONTRAST TECHNIQUE: Multidetector CT imaging of the chest, abdomen and pelvis was performed following the standard protocol during bolus administration of intravenous contrast. CONTRAST:  738mOMNIPAQUE IOHEXOL 300 MG/ML  SOLN COMPARISON:  No priors. FINDINGS: CT CHEST FINDINGS Mediastinum/Lymph  Nodes: Heart size is normal. There is no significant pericardial fluid, thickening or pericardial calcification. There is atherosclerosis of the thoracic aorta, the great vessels of the mediastinum and the coronary arteries, including calcified atherosclerotic plaque in the left  main, left anterior descending, left circumflex and right coronary arteries. Calcifications of the aortic valve and mitral valve/annulus. No pathologically enlarged mediastinal or hilar lymph nodes. Esophagus is unremarkable in appearance. No axillary lymphadenopathy. Lungs/Pleura: 4 mm subpleural nodule in the lateral segment of the right middle lobe (image 31 of series 4). 4 mm subpleural nodule in the posterior left lower lobe (image 38 of series 4). No other suspicious appearing pulmonary nodules or masses. No acute consolidative airspace disease. No pleural effusions. Musculoskeletal/Soft Tissues: There are no aggressive appearing lytic or blastic lesions noted in the visualized portions of the skeleton. CT ABDOMEN AND PELVIS FINDINGS Hepatobiliary: No cystic or solid hepatic lesions. No intra or extrahepatic biliary ductal dilatation. 7 mm calcified gallstone lying dependently in the gallbladder. No current findings to suggest an acute cholecystitis at this time. Pancreas: No pancreatic mass. No pancreatic ductal dilatation. No pancreatic or peripancreatic fluid or inflammatory changes. Spleen: Unremarkable. Adrenals/Urinary Tract: In the lower pole of the left kidney there is a 5.9 x 6.1 x 6.3 cm heterogeneously enhancing lesion highly concerning for renal cell carcinoma. The inferior aspect of this lesion comes in very close proximity to the anterior aspect of the left psoas muscle and quadratus lumborum muscle, however, there appears to be an intervening fat plane at this time. The lesion is well separated from the left renal vein. Right kidney and right adrenal gland are normal in appearance. 1.2 x 1.4 cm indeterminate nodule in the lateral limb of the left adrenal gland. No hydroureteronephrosis. Urinary bladder is largely decompressed, but otherwise unremarkable in appearance. Stomach/Bowel: Normal appearance of the stomach. No pathologic dilatation of small bowel or colon. A few scattered colonic  diverticulae are noted, without surrounding inflammatory changes to suggest an acute diverticulitis at this time. Appendix is normal. Vascular/Lymphatic: Atherosclerosis throughout the abdominal and pelvic vasculature, without evidence of aneurysm or dissection. Left renal vein is widely patent and separate from the lower pole mass. No lymphadenopathy noted in the abdomen or pelvis. Reproductive: Uterus and ovaries are unremarkable in appearance. Other: No significant volume of ascites.  No pneumoperitoneum. Musculoskeletal: There are no aggressive appearing lytic or blastic lesions noted in the visualized portions of the skeleton. IMPRESSION: 1. No acute findings in the abdomen or pelvis to account for the patient's symptoms. 2. However, there is a 5.9 x 6.1 x 6.3 cm heterogeneously enhancing mass in the lower pole of the left kidney highly concerning for renal cell carcinoma. At this time, the lesion appears likely to be encapsulated within Gerota's fascia (although it comes very close to the left psoas and quadratus lumborum musculature), does not involve the left renal vein, and does not appear to be associated with lymphadenopathy. Nonemergent Urologic consultation for surgical resection is strongly recommended in the near future. 3. 1.2 x 1.4 cm indeterminate nodule in the left adrenal gland. Attention on followup studies is recommended, as a metastatic lesion is not excluded. 4. 4 mm subpleural nodules in the right middle lobe and left lower lobe. These are highly nonspecific, and favored to represent subpleural lymph nodes, but attention on followup studies is recommended to ensure stability. 5. Cholelithiasis without evidence of acute cholecystitis at this time. 6. Atherosclerosis, including left main and 3 vessel coronary artery disease. Please note  that although the presence of coronary artery calcium documents the presence of coronary artery disease, the severity of this disease and any potential  stenosis cannot be assessed on this non-gated CT examination. Assessment for potential risk factor modification, dietary therapy or pharmacologic therapy may be warranted, if clinically indicated. 7. There are calcifications of the aortic valve and mitral valve/annulus. Echocardiographic correlation for evaluation of potential valvular dysfunction may be warranted if clinically indicated. 8. Colonic diverticulosis without evidence of acute diverticulitis at this time. 9. Additional incidental findings, as above. These results were called by telephone at the time of interpretation on 08/29/2015 at 7:34 pm to Dr. Davonna Belling, who verbally acknowledged these results. Electronically Signed   By: Vinnie Langton M.D.   On: 08/29/2015 19:37   Ct Abdomen Pelvis W Contrast  08/29/2015  CLINICAL DATA:  60 year old female with 2-3 day history of vomiting. Abdominal pain for the past month. Loss of voice. EXAM: CT CHEST, ABDOMEN, AND PELVIS WITH CONTRAST TECHNIQUE: Multidetector CT imaging of the chest, abdomen and pelvis was performed following the standard protocol during bolus administration of intravenous contrast. CONTRAST:  40m OMNIPAQUE IOHEXOL 300 MG/ML  SOLN COMPARISON:  No priors. FINDINGS: CT CHEST FINDINGS Mediastinum/Lymph Nodes: Heart size is normal. There is no significant pericardial fluid, thickening or pericardial calcification. There is atherosclerosis of the thoracic aorta, the great vessels of the mediastinum and the coronary arteries, including calcified atherosclerotic plaque in the left main, left anterior descending, left circumflex and right coronary arteries. Calcifications of the aortic valve and mitral valve/annulus. No pathologically enlarged mediastinal or hilar lymph nodes. Esophagus is unremarkable in appearance. No axillary lymphadenopathy. Lungs/Pleura: 4 mm subpleural nodule in the lateral segment of the right middle lobe (image 31 of series 4). 4 mm subpleural nodule in the  posterior left lower lobe (image 38 of series 4). No other suspicious appearing pulmonary nodules or masses. No acute consolidative airspace disease. No pleural effusions. Musculoskeletal/Soft Tissues: There are no aggressive appearing lytic or blastic lesions noted in the visualized portions of the skeleton. CT ABDOMEN AND PELVIS FINDINGS Hepatobiliary: No cystic or solid hepatic lesions. No intra or extrahepatic biliary ductal dilatation. 7 mm calcified gallstone lying dependently in the gallbladder. No current findings to suggest an acute cholecystitis at this time. Pancreas: No pancreatic mass. No pancreatic ductal dilatation. No pancreatic or peripancreatic fluid or inflammatory changes. Spleen: Unremarkable. Adrenals/Urinary Tract: In the lower pole of the left kidney there is a 5.9 x 6.1 x 6.3 cm heterogeneously enhancing lesion highly concerning for renal cell carcinoma. The inferior aspect of this lesion comes in very close proximity to the anterior aspect of the left psoas muscle and quadratus lumborum muscle, however, there appears to be an intervening fat plane at this time. The lesion is well separated from the left renal vein. Right kidney and right adrenal gland are normal in appearance. 1.2 x 1.4 cm indeterminate nodule in the lateral limb of the left adrenal gland. No hydroureteronephrosis. Urinary bladder is largely decompressed, but otherwise unremarkable in appearance. Stomach/Bowel: Normal appearance of the stomach. No pathologic dilatation of small bowel or colon. A few scattered colonic diverticulae are noted, without surrounding inflammatory changes to suggest an acute diverticulitis at this time. Appendix is normal. Vascular/Lymphatic: Atherosclerosis throughout the abdominal and pelvic vasculature, without evidence of aneurysm or dissection. Left renal vein is widely patent and separate from the lower pole mass. No lymphadenopathy noted in the abdomen or pelvis. Reproductive: Uterus and  ovaries are unremarkable in appearance.  Other: No significant volume of ascites.  No pneumoperitoneum. Musculoskeletal: There are no aggressive appearing lytic or blastic lesions noted in the visualized portions of the skeleton. IMPRESSION: 1. No acute findings in the abdomen or pelvis to account for the patient's symptoms. 2. However, there is a 5.9 x 6.1 x 6.3 cm heterogeneously enhancing mass in the lower pole of the left kidney highly concerning for renal cell carcinoma. At this time, the lesion appears likely to be encapsulated within Gerota's fascia (although it comes very close to the left psoas and quadratus lumborum musculature), does not involve the left renal vein, and does not appear to be associated with lymphadenopathy. Nonemergent Urologic consultation for surgical resection is strongly recommended in the near future. 3. 1.2 x 1.4 cm indeterminate nodule in the left adrenal gland. Attention on followup studies is recommended, as a metastatic lesion is not excluded. 4. 4 mm subpleural nodules in the right middle lobe and left lower lobe. These are highly nonspecific, and favored to represent subpleural lymph nodes, but attention on followup studies is recommended to ensure stability. 5. Cholelithiasis without evidence of acute cholecystitis at this time. 6. Atherosclerosis, including left main and 3 vessel coronary artery disease. Please note that although the presence of coronary artery calcium documents the presence of coronary artery disease, the severity of this disease and any potential stenosis cannot be assessed on this non-gated CT examination. Assessment for potential risk factor modification, dietary therapy or pharmacologic therapy may be warranted, if clinically indicated. 7. There are calcifications of the aortic valve and mitral valve/annulus. Echocardiographic correlation for evaluation of potential valvular dysfunction may be warranted if clinically indicated. 8. Colonic diverticulosis  without evidence of acute diverticulitis at this time. 9. Additional incidental findings, as above. These results were called by telephone at the time of interpretation on 08/29/2015 at 7:34 pm to Dr. Davonna Belling, who verbally acknowledged these results. Electronically Signed   By: Vinnie Langton M.D.   On: 08/29/2015 19:37    STM:[id number] except as listed in admit H&P  Blood pressure 103/57, pulse 114, temperature 99.1 F (37.3 C), temperature source Oral, resp. rate 18, height '5\' 3"'$  (1.6 m), weight 113.399 kg (250 lb), SpO2 98 %.  PHYSICAL EXAM: Overall appearance:  Obese lady, in no distress. She is aphonic, talking in a whisper, without any stridor. Head:  Normocephalic, atraumatic. Ears: External ears are normal. Nose: External nose is healthy in appearance. Internal nasal exam free of any lesions or obstruction. Oral Cavity/Pharynx:  There are no mucosal lesions or masses identified. Larynx/Hypopharynx: Fiiberoptc exam reveals a diffuse verucous appearing lesion encasing the entire supraglottic larynx. The cords are not visible. There is no recognizable laryngeal or hypopharyngeal anatomy. Neuro:  No identifiable neurologic deficits. Neck: No palpable neck masses.  Studies Reviewed: CT neck and chest.  Procedures: Flexible fiberoptic laryngoscopy. Topical afrin/xylocaine spray was applied to the nasal cavities. The fiberoptic scope was passed through the right side of the nose. The larynx and hypopharynx were inspected. The findings are all described above.   Assessment/Plan: Supraglottc laryngeal and hypopharyngeal mass. No obvious airway obstruction. This will need to be biopsied with operative laryngoscopy. This is most likely squamous cell carcinoma. Treatment may be chemo/XRT or possibly laryngopharyngectomy with flap reconstruction (which would need to be done in a University setting). She will need a feeding tube regardless. I can schedule laryngoscopy with biopsy either as  in patient or out patient.  Marylynn Rigdon 08/30/2015, 9:14 AM

## 2015-08-31 ENCOUNTER — Inpatient Hospital Stay (HOSPITAL_COMMUNITY): Payer: Medicaid Other

## 2015-08-31 DIAGNOSIS — I10 Essential (primary) hypertension: Secondary | ICD-10-CM

## 2015-08-31 DIAGNOSIS — D62 Acute posthemorrhagic anemia: Secondary | ICD-10-CM

## 2015-08-31 DIAGNOSIS — K922 Gastrointestinal hemorrhage, unspecified: Secondary | ICD-10-CM

## 2015-08-31 LAB — COMPREHENSIVE METABOLIC PANEL
ALK PHOS: 70 U/L (ref 38–126)
ALT: 12 U/L — AB (ref 14–54)
AST: 13 U/L — ABNORMAL LOW (ref 15–41)
Albumin: 2.5 g/dL — ABNORMAL LOW (ref 3.5–5.0)
Anion gap: 7 (ref 5–15)
BILIRUBIN TOTAL: 0.6 mg/dL (ref 0.3–1.2)
BUN: 36 mg/dL — AB (ref 6–20)
CALCIUM: 8.6 mg/dL — AB (ref 8.9–10.3)
CHLORIDE: 113 mmol/L — AB (ref 101–111)
CO2: 24 mmol/L (ref 22–32)
CREATININE: 0.88 mg/dL (ref 0.44–1.00)
Glucose, Bld: 174 mg/dL — ABNORMAL HIGH (ref 65–99)
Potassium: 3.9 mmol/L (ref 3.5–5.1)
Sodium: 144 mmol/L (ref 135–145)
TOTAL PROTEIN: 6 g/dL — AB (ref 6.5–8.1)

## 2015-08-31 LAB — CBC
HCT: 25.2 % — ABNORMAL LOW (ref 36.0–46.0)
HCT: 25.6 % — ABNORMAL LOW (ref 36.0–46.0)
HEMATOCRIT: 22.9 % — AB (ref 36.0–46.0)
HEMOGLOBIN: 8 g/dL — AB (ref 12.0–15.0)
HEMOGLOBIN: 7.3 g/dL — AB (ref 12.0–15.0)
Hemoglobin: 8.2 g/dL — ABNORMAL LOW (ref 12.0–15.0)
MCH: 26.1 pg (ref 26.0–34.0)
MCH: 26.6 pg (ref 26.0–34.0)
MCH: 26.3 pg (ref 26.0–34.0)
MCHC: 31.3 g/dL (ref 30.0–36.0)
MCHC: 32.5 g/dL (ref 30.0–36.0)
MCHC: 31.9 g/dL (ref 30.0–36.0)
MCV: 81.8 fL (ref 78.0–100.0)
MCV: 83.7 fL (ref 78.0–100.0)
MCV: 82.4 fL (ref 78.0–100.0)
PLATELETS: 206 10*3/uL (ref 150–400)
Platelets: 196 10*3/uL (ref 150–400)
Platelets: 199 10*3/uL (ref 150–400)
RBC: 3.06 MIL/uL — ABNORMAL LOW (ref 3.87–5.11)
RBC: 3.08 MIL/uL — AB (ref 3.87–5.11)
RBC: 2.78 MIL/uL — ABNORMAL LOW (ref 3.87–5.11)
RDW: 13.8 % (ref 11.5–15.5)
RDW: 13.9 % (ref 11.5–15.5)
RDW: 13.9 % (ref 11.5–15.5)
WBC: 19.1 10*3/uL — ABNORMAL HIGH (ref 4.0–10.5)
WBC: 19.9 10*3/uL — AB (ref 4.0–10.5)
WBC: 17.2 10*3/uL — AB (ref 4.0–10.5)

## 2015-08-31 LAB — GLUCOSE, CAPILLARY
GLUCOSE-CAPILLARY: 97 mg/dL (ref 65–99)
GLUCOSE-CAPILLARY: 139 mg/dL — AB (ref 65–99)
Glucose-Capillary: 117 mg/dL — ABNORMAL HIGH (ref 65–99)
Glucose-Capillary: 137 mg/dL — ABNORMAL HIGH (ref 65–99)

## 2015-08-31 LAB — MAGNESIUM: MAGNESIUM: 1.8 mg/dL (ref 1.7–2.4)

## 2015-08-31 MED ORDER — DIPHENHYDRAMINE HCL 50 MG/ML IJ SOLN
25.0000 mg | Freq: Once | INTRAMUSCULAR | Status: AC
  Administered 2015-08-31: 25 mg via INTRAVENOUS
  Filled 2015-08-31: qty 1

## 2015-08-31 NOTE — Progress Notes (Signed)
Phlebotomy went into room to take patient's blood for CBC, pt refused. Pt wants to wait until she finishes eating her breakfast. Explained to pt the importance of getting her blood and pt still refused until she finishes eating

## 2015-08-31 NOTE — Progress Notes (Signed)
PATIENT DETAILS Name: Ashley Ramsey Age: 60 y.o. Sex: female Date of Birth: 1956/06/17 Admit Date: 08/29/2015 Admitting Physician [name], MD [id number], [name], MD  Subjective: Has a few more episodes of hematochezia overnight-but No further hematochezia or melena this AM. She expresses concern about her probable cancer and various other medical problems.  Assessment/Plan: Active Problems: Probable head and neck cancer: ENT input appreciated, suspect would benefit from getting inpatient biopsy-subsequently will need referral Oncology.   Left renal mass: Highly suspicious for renal cell carcinoma. Doubt it is related to above. Spoke with Dr. Ree Kida MD on 1/17,he reviewed patient's CT scan in chart-suggested nothing to be done in the inpatient setting-outpatient follow with him in the office for consideration of nephrectomy in the future.  No history of hematuria.  UTI with SIRS: Continue Rocephin, prelim urine culture shows gram neg rods. Afebrile, WBC decreasing.   Lower GI bleeding: Ongoing for the past 1 week intermittently-has had a few episodes of hematochezia in the hospital. CT scan abdomen shows diverticula-likely diverticular bleeding. GI recommends holding off on colonoscopy at this time due to risks of the procedure and the need to address more pressing issues- if bleeding progresses this may need to be readdressed. Follow CBC, transfuse for when necessary, GI signed off.  Acute blood loss anemia: Likely secondary to hematochezia, probably has chronic anemia from chronic disease. Transfused 1U yesterday, Hgb stable at 8.2 today, follow. Transfuse as needed.  Acute renal failure: Likely prerenal azotemia in a setting of UTI/GI bleeding-along with lisinopril/HCTZ use.  Resolved with IVF. Follow periodically.  Hypokalemia: Repleted  Type 2 diabetes: Continue SSI, hold metformin. Follow CBGs  Hypertension: BP labile yesterday, better at 126/94  this am. Continue to hold lisinopril/HCTZ.   Tobacco abuse: Counseled  Alcohol abuse: Counseled, Ativan per CIWA protocol  Left adrenal gland nodule: Will need outpatient follow-up, especially given left adrenal mass.  Subpleural nodules in the right middle lobe of right lung and left lower lobe of left lung: Will need to repeat CT chest in 6 months. Oxygenating well on room air, no dyspnea  Possible CAD: Atherosclerosis seen CT chest. Unfortunately with ongoing lower GI bleeding-unable to use antiplatelets. Await Echo, but given numerous above medical problems-not a candidate for any procedures/further workup at this point.  Morbid obesity: Has lost significant amount of weight over the past few months.  Disposition: Remains inpatient  Antimicrobial agents  See below  Anti-infectives    Start     Dose/Rate Route Frequency Ordered Stop   08/29/15 2330  cefTRIAXone (ROCEPHIN) 1 g in dextrose 5 % 50 mL IVPB     1 g 100 mL/hr over 30 Minutes Intravenous Every 24 hours 08/29/15 2250        DVT Prophylaxis: SCD's, no pharmacological DVT ppx given GI bleed/anemia  Code Status: Full code   Family Communication None at bedside, plan to discuss with family when they arrive later today.  Procedures: None  CONSULTS:  GI, urology and ent  Time spent 30 minutes-Greater than 50% of this time was spent in counseling, explanation of diagnosis, planning of further management, and coordination of care.  MEDICATIONS: Scheduled Meds: . sodium chloride   Intravenous Once  . cefTRIAXone (ROCEPHIN)  IV  1 g Intravenous Q24H  . folic acid  1 mg Oral Daily  . Influenza vac split quadrivalent PF  0.5 mL Intramuscular Tomorrow-1000  . insulin aspart  0-9 Units Subcutaneous TID WC  . multivitamin with minerals  1 tablet Oral Daily  . pantoprazole (PROTONIX) IV  40 mg Intravenous Q12H  . thiamine  100 mg Oral Daily   Or  . thiamine  100 mg Intravenous Daily   Continuous Infusions: .  sodium chloride 75 mL/hr at 08/31/15 0755   PRN Meds:.acetaminophen **OR** acetaminophen, HYDROcodone-acetaminophen, lidocaine, LORazepam **OR** LORazepam, ondansetron **OR** ondansetron (ZOFRAN) IV, oxymetazoline    PHYSICAL EXAM: Vital signs in last 24 hours: Filed Vitals:   08/31/15 0049 08/31/15 0126 08/31/15 0141 08/31/15 0532  BP: 99/64 101/64 108/66 126/94  Pulse: 100 96 90 101  Temp: 98.1 F (36.7 C) 98.4 F (36.9 C) 98.4 F (36.9 C) 98 F (36.7 C)  TempSrc: Oral Oral Oral Oral  Resp: 24 22 20 18   Height:      Weight:      SpO2: 100% 100% 99% 100%    Weight change:  Filed Weights   08/29/15 1422  Weight: 113.399 kg (250 lb)   Body mass index is 44.3 kg/(m^2).   Gen Exam: Awake and alert with clear speech. Obese. Voice is very hoarse and weak. Neck: Supple-exam limited by body habitus. No stridor Chest: clear to auscultation CVS: S1 S2 regular. Abdomen: soft, non tender, non distended.  Extremities: no edema Neurologic: Non Focal Psych: cooperative Wounds: N/A.   Intake/Output from previous day:  Intake/Output Summary (Last 24 hours) at 08/31/15 1007 Last data filed at 08/31/15 0755  Gross per 24 hour  Intake 3727.08 ml  Output      0 ml  Net 3727.08 ml     LAB RESULTS: CBC  Recent Labs Lab 08/29/15 1446 08/30/15 0142 08/30/15 1448 08/30/15 2200  WBC 24.8* 20.4* 20.2* 19.8*  HGB 10.9* 8.8* 7.8* 7.2*  HCT 34.8* 27.6* 24.9* 22.3*  PLT 287 217 224 195  MCV 81.9 81.4 81.6 81.1  MCH 25.6* 26.0 25.6* 26.2  MCHC 31.3 31.9 31.3 32.3  RDW 13.5 13.7 13.6 14.0    Chemistries   Recent Labs Lab 08/29/15 1446 08/30/15 0142  NA 138 137  K 3.1* 2.9*  CL 96* 96*  CO2 27 31  GLUCOSE 166* 165*  BUN 53* 56*  CREATININE 1.47* 1.46*  CALCIUM 9.1 8.3*  MG  --  1.5*    CBG:  Recent Labs Lab 08/30/15 1740 08/31/15 0753  GLUCAP 144* 97    GFR Estimated Creatinine Clearance: 50.3 mL/min (by C-G formula based on Cr of 1.46).  Coagulation  profile No results for input(s): INR, PROTIME in the last 168 hours.  Cardiac Enzymes No results for input(s): CKMB, TROPONINI, MYOGLOBIN in the last 168 hours.  Invalid input(s): CK  Invalid input(s): POCBNP No results for input(s): DDIMER in the last 72 hours. No results for input(s): HGBA1C in the last 72 hours. No results for input(s): CHOL, HDL, LDLCALC, TRIG, CHOLHDL, LDLDIRECT in the last 72 hours. No results for input(s): TSH, T4TOTAL, T3FREE, THYROIDAB in the last 72 hours.  Invalid input(s): FREET3  Recent Labs  08/30/15 1630 08/30/15 2200  VITAMINB12  --  801  FOLATE  --  17.3  FERRITIN  --  333*  TIBC  --  207*  IRON  --  38  RETICCTPCT 1.5  --     Recent Labs  08/29/15 1446  LIPASE 22    Urine Studies No results for input(s): UHGB, CRYS in the last 72 hours.  Invalid input(s): UACOL, UAPR, USPG, UPH, UTP, UGL, UKET, UBIL, UNIT,  UROB, ULEU, UEPI, UWBC, URBC, UBAC, CAST, UCOM, BILUA  MICROBIOLOGY: No results found for this or any previous visit (from the past 240 hour(s)).  RADIOLOGY STUDIES/RESULTS: Dg Chest 2 View  08/29/2015  CLINICAL DATA:  One month history of chest pain and hoarse voice. EXAM: CHEST  2 VIEW COMPARISON:  None. FINDINGS: The heart is borderline and enlarged. Mild tortuosity of the thoracic aorta. Low lung volumes with mild vascular crowding and streaky basilar atelectasis. There also bronchitic changes which could be acute or chronic. No infiltrates or effusions. The bony thorax is intact. IMPRESSION: Acute versus chronic bronchitic change.  No infiltrates or effusions Electronically Signed   By: Marijo Sanes M.D.   On: 08/29/2015 16:15   Ct Soft Tissue Neck W Contrast  08/29/2015  CLINICAL DATA:  60 year old female with abnormal full waist for 1 month. Abdominal pain. Three days of vomiting. Initial encounter. EXAM: CT NECK WITH CONTRAST TECHNIQUE: Multidetector CT imaging of the neck was performed using the standard protocol following  the bolus administration of intravenous contrast. CONTRAST:  45mL OMNIPAQUE IOHEXOL 300 MG/ML SOLN in conjunction with contrast enhanced imaging of the chest, abdomen, and pelvis reported separately. COMPARISON:  CT chest abdomen and pelvis from today reported separately FINDINGS: Pharynx and larynx: Bulky supraglottic laryngeal tumor with marked soft tissue enlargement of the bilateral aryepiglottic folds and anterior commissure up to 17 mm in thickness. Tumor appears inseparable from the undersurface of the strap muscles suggesting extension through the thyroid cartilage bilaterally. Posterior hypopharynx involvement. The epiglottis is thickened, nodular, and distorted. Furthermore, the vallecula is mostly effaced and soft tissue at the base of tongue appears nodular and thickened. All told, tumor encompasses 37 x 40 x 58 mm (AP by transverse by CC). The other laryngeal cartilages appear spared. The palatine tonsils, soft palate, and nasopharynx appear within normal limits. Superior parapharyngeal spaces and retropharyngeal space are within normal limits. Salivary glands: Sublingual space, submandibular glands, and parotid glands are within normal limits. Thyroid: Coarsely calcified 2.3 cm right thyroid nodule. Subcentimeter left lobe nodule. Lymph nodes: Abnormal right level 3 node measuring 9 mm at the level of the thyroid cartilage (series 1, image 57). Small but asymmetric 5 mm right level IIIa lymph node at the level of the hyoid bone on the right (series 1, image 49). Bilateral level 2A nodes appear symmetric measuring 8-9 mm in thickness. No level 4, 5, or level 1 lymphadenopathy. Vascular: Suboptimal intravascular contrast timing, but major vascular structures in the neck and at the skullbase appear to remain patent. Left greater than right carotid bifurcation calcified atherosclerosis. Limited intracranial: Negative.  Partially empty sella. Visualized orbits: Negative. Mastoids and visualized paranasal  sinuses: Visualized paranasal sinuses and mastoids are clear. Skeleton: Mostly absent dentition. Periapical lucency about the residual left mandible molar. No osseous metastatic disease identified. Upper chest: Reported separately today. IMPRESSION: 1. 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 2. Suspect metastatic nodal disease at level 3 on the right (small but asymmetric up to 9 mm nodes). Bilateral level 2 nodes are indeterminate and also measure up to 9 mm individually. No cystic or necrotic nodes in the neck. 3. See CT chest abdomen and pelvis from today reported separately. Electronically Signed   By: Genevie Ann M.D.   On: 08/29/2015 19:56   Ct Chest W Contrast  08/29/2015  CLINICAL DATA:  60 year old female with 2-3 day history of vomiting. Abdominal pain for  the past month. Loss of voice. EXAM: CT CHEST, ABDOMEN, AND PELVIS WITH CONTRAST TECHNIQUE: Multidetector CT imaging of the chest, abdomen and pelvis was performed following the standard protocol during bolus administration of intravenous contrast. CONTRAST:  63mL OMNIPAQUE IOHEXOL 300 MG/ML  SOLN COMPARISON:  No priors. FINDINGS: CT CHEST FINDINGS Mediastinum/Lymph Nodes: Heart size is normal. There is no significant pericardial fluid, thickening or pericardial calcification. There is atherosclerosis of the thoracic aorta, the great vessels of the mediastinum and the coronary arteries, including calcified atherosclerotic plaque in the left main, left anterior descending, left circumflex and right coronary arteries. Calcifications of the aortic valve and mitral valve/annulus. No pathologically enlarged mediastinal or hilar lymph nodes. Esophagus is unremarkable in appearance. No axillary lymphadenopathy. Lungs/Pleura: 4 mm subpleural nodule in the lateral segment of the right middle lobe (image 31 of series 4). 4 mm subpleural nodule in the posterior left lower  lobe (image 38 of series 4). No other suspicious appearing pulmonary nodules or masses. No acute consolidative airspace disease. No pleural effusions. Musculoskeletal/Soft Tissues: There are no aggressive appearing lytic or blastic lesions noted in the visualized portions of the skeleton. CT ABDOMEN AND PELVIS FINDINGS Hepatobiliary: No cystic or solid hepatic lesions. No intra or extrahepatic biliary ductal dilatation. 7 mm calcified gallstone lying dependently in the gallbladder. No current findings to suggest an acute cholecystitis at this time. Pancreas: No pancreatic mass. No pancreatic ductal dilatation. No pancreatic or peripancreatic fluid or inflammatory changes. Spleen: Unremarkable. Adrenals/Urinary Tract: In the lower pole of the left kidney there is a 5.9 x 6.1 x 6.3 cm heterogeneously enhancing lesion highly concerning for renal cell carcinoma. The inferior aspect of this lesion comes in very close proximity to the anterior aspect of the left psoas muscle and quadratus lumborum muscle, however, there appears to be an intervening fat plane at this time. The lesion is well separated from the left renal vein. Right kidney and right adrenal gland are normal in appearance. 1.2 x 1.4 cm indeterminate nodule in the lateral limb of the left adrenal gland. No hydroureteronephrosis. Urinary bladder is largely decompressed, but otherwise unremarkable in appearance. Stomach/Bowel: Normal appearance of the stomach. No pathologic dilatation of small bowel or colon. A few scattered colonic diverticulae are noted, without surrounding inflammatory changes to suggest an acute diverticulitis at this time. Appendix is normal. Vascular/Lymphatic: Atherosclerosis throughout the abdominal and pelvic vasculature, without evidence of aneurysm or dissection. Left renal vein is widely patent and separate from the lower pole mass. No lymphadenopathy noted in the abdomen or pelvis. Reproductive: Uterus and ovaries are unremarkable  in appearance. Other: No significant volume of ascites.  No pneumoperitoneum. Musculoskeletal: There are no aggressive appearing lytic or blastic lesions noted in the visualized portions of the skeleton. IMPRESSION: 1. No acute findings in the abdomen or pelvis to account for the patient's symptoms. 2. However, there is a 5.9 x 6.1 x 6.3 cm heterogeneously enhancing mass in the lower pole of the left kidney highly concerning for renal cell carcinoma. At this time, the lesion appears likely to be encapsulated within Gerota's fascia (although it comes very close to the left psoas and quadratus lumborum musculature), does not involve the left renal vein, and does not appear to be associated with lymphadenopathy. Nonemergent Urologic consultation for surgical resection is strongly recommended in the near future. 3. 1.2 x 1.4 cm indeterminate nodule in the left adrenal gland. Attention on followup studies is recommended, as a metastatic lesion is not excluded. 4. 4 mm subpleural  nodules in the right middle lobe and left lower lobe. These are highly nonspecific, and favored to represent subpleural lymph nodes, but attention on followup studies is recommended to ensure stability. 5. Cholelithiasis without evidence of acute cholecystitis at this time. 6. Atherosclerosis, including left main and 3 vessel coronary artery disease. Please note that although the presence of coronary artery calcium documents the presence of coronary artery disease, the severity of this disease and any potential stenosis cannot be assessed on this non-gated CT examination. Assessment for potential risk factor modification, dietary therapy or pharmacologic therapy may be warranted, if clinically indicated. 7. There are calcifications of the aortic valve and mitral valve/annulus. Echocardiographic correlation for evaluation of potential valvular dysfunction may be warranted if clinically indicated. 8. Colonic diverticulosis without evidence of acute  diverticulitis at this time. 9. Additional incidental findings, as above. These results were called by telephone at the time of interpretation on 08/29/2015 at 7:34 pm to Dr. Davonna Belling, who verbally acknowledged these results. Electronically Signed   By: Vinnie Langton M.D.   On: 08/29/2015 19:37   Ct Abdomen Pelvis W Contrast  08/29/2015  CLINICAL DATA:  60 year old female with 2-3 day history of vomiting. Abdominal pain for the past month. Loss of voice. EXAM: CT CHEST, ABDOMEN, AND PELVIS WITH CONTRAST TECHNIQUE: Multidetector CT imaging of the chest, abdomen and pelvis was performed following the standard protocol during bolus administration of intravenous contrast. CONTRAST:  93mL OMNIPAQUE IOHEXOL 300 MG/ML  SOLN COMPARISON:  No priors. FINDINGS: CT CHEST FINDINGS Mediastinum/Lymph Nodes: Heart size is normal. There is no significant pericardial fluid, thickening or pericardial calcification. There is atherosclerosis of the thoracic aorta, the great vessels of the mediastinum and the coronary arteries, including calcified atherosclerotic plaque in the left main, left anterior descending, left circumflex and right coronary arteries. Calcifications of the aortic valve and mitral valve/annulus. No pathologically enlarged mediastinal or hilar lymph nodes. Esophagus is unremarkable in appearance. No axillary lymphadenopathy. Lungs/Pleura: 4 mm subpleural nodule in the lateral segment of the right middle lobe (image 31 of series 4). 4 mm subpleural nodule in the posterior left lower lobe (image 38 of series 4). No other suspicious appearing pulmonary nodules or masses. No acute consolidative airspace disease. No pleural effusions. Musculoskeletal/Soft Tissues: There are no aggressive appearing lytic or blastic lesions noted in the visualized portions of the skeleton. CT ABDOMEN AND PELVIS FINDINGS Hepatobiliary: No cystic or solid hepatic lesions. No intra or extrahepatic biliary ductal dilatation. 7 mm  calcified gallstone lying dependently in the gallbladder. No current findings to suggest an acute cholecystitis at this time. Pancreas: No pancreatic mass. No pancreatic ductal dilatation. No pancreatic or peripancreatic fluid or inflammatory changes. Spleen: Unremarkable. Adrenals/Urinary Tract: In the lower pole of the left kidney there is a 5.9 x 6.1 x 6.3 cm heterogeneously enhancing lesion highly concerning for renal cell carcinoma. The inferior aspect of this lesion comes in very close proximity to the anterior aspect of the left psoas muscle and quadratus lumborum muscle, however, there appears to be an intervening fat plane at this time. The lesion is well separated from the left renal vein. Right kidney and right adrenal gland are normal in appearance. 1.2 x 1.4 cm indeterminate nodule in the lateral limb of the left adrenal gland. No hydroureteronephrosis. Urinary bladder is largely decompressed, but otherwise unremarkable in appearance. Stomach/Bowel: Normal appearance of the stomach. No pathologic dilatation of small bowel or colon. A few scattered colonic diverticulae are noted, without surrounding inflammatory changes to  suggest an acute diverticulitis at this time. Appendix is normal. Vascular/Lymphatic: Atherosclerosis throughout the abdominal and pelvic vasculature, without evidence of aneurysm or dissection. Left renal vein is widely patent and separate from the lower pole mass. No lymphadenopathy noted in the abdomen or pelvis. Reproductive: Uterus and ovaries are unremarkable in appearance. Other: No significant volume of ascites.  No pneumoperitoneum. Musculoskeletal: There are no aggressive appearing lytic or blastic lesions noted in the visualized portions of the skeleton. IMPRESSION: 1. No acute findings in the abdomen or pelvis to account for the patient's symptoms. 2. However, there is a 5.9 x 6.1 x 6.3 cm heterogeneously enhancing mass in the lower pole of the left kidney highly concerning  for renal cell carcinoma. At this time, the lesion appears likely to be encapsulated within Gerota's fascia (although it comes very close to the left psoas and quadratus lumborum musculature), does not involve the left renal vein, and does not appear to be associated with lymphadenopathy. Nonemergent Urologic consultation for surgical resection is strongly recommended in the near future. 3. 1.2 x 1.4 cm indeterminate nodule in the left adrenal gland. Attention on followup studies is recommended, as a metastatic lesion is not excluded. 4. 4 mm subpleural nodules in the right middle lobe and left lower lobe. These are highly nonspecific, and favored to represent subpleural lymph nodes, but attention on followup studies is recommended to ensure stability. 5. Cholelithiasis without evidence of acute cholecystitis at this time. 6. Atherosclerosis, including left main and 3 vessel coronary artery disease. Please note that although the presence of coronary artery calcium documents the presence of coronary artery disease, the severity of this disease and any potential stenosis cannot be assessed on this non-gated CT examination. Assessment for potential risk factor modification, dietary therapy or pharmacologic therapy may be warranted, if clinically indicated. 7. There are calcifications of the aortic valve and mitral valve/annulus. Echocardiographic correlation for evaluation of potential valvular dysfunction may be warranted if clinically indicated. 8. Colonic diverticulosis without evidence of acute diverticulitis at this time. 9. Additional incidental findings, as above. These results were called by telephone at the time of interpretation on 08/29/2015 at 7:34 pm to Dr. Davonna Belling, who verbally acknowledged these results. Electronically Signed   By: Vinnie Langton M.D.   On: 08/29/2015 19:37    Paige Horcher  PA-S   [facility] Pager:[phone]  If 7PM-7AM, please contact  night-coverage [email or web address] Password [email or web address] 08/31/2015, 10:07 AM   LOS: 1 day   Attending MD note  Patient was seen, examined,treatment plan was discussed with the PA-S.  I have personally reviewed the clinical findings, lab, imaging studies and management of this patient in detail. I agree with the documentation, as recorded by the PA-S.   Had several episodes of hematochezia overnight-none this morning. Hemoglobin stable following 1 unit of PRBCs.Continue Rocephin, urine cultures preliminary shows gram-negative rods. ENT contemplating inpatient or outpatient biopsy, will need outpatient urology follow-up for nephrectomy for presumed left renal cell cancer.  Rest as above  We will continue to follow closely   [facility]

## 2015-08-31 NOTE — Care Management Note (Signed)
Case Management Note  Patient Details  Name: AGAPE EDMUNDSON MRN: [id number] Date of Birth: 28-Mar-1956  Subjective/Objective:      Admitted with Laryngeal mass.  Lives with daughter(April) and sister(Copeland Neisen). PTA independent with ADL's. No DME usage.   Action/Plan: Return to home when medically stable. CM to f/u with d/c needs.  Expected Discharge Date:                  Expected Discharge Plan:  Home/Self Care  In-House Referral:     Discharge planning Services  CM Consult  Post Acute Care Choice:    Choice offered to:     DME Arranged:    DME Agency:     HH Arranged:    HH Agency:     Status of Service:  In process, will continue to follow  Medicare Important Message Given:    Date Medicare IM Given:    Medicare IM give by:    Date Additional Medicare IM Given:    Additional Medicare Important Message give by:     If discussed at [facility] of Stay Meetings, dates discussed:    Additional Comments: Pilar Plate (Sister) Sache [phone]  [facility] [address] [place], [place] [phone] 08/31/2015, 12:36 PM

## 2015-08-31 NOTE — Progress Notes (Signed)
Pt with complaint of difficulty swallowing food d/t mass. Provider Ghimire made aware. Pt to get a speech eval and diet changed do dys 2. Speech to see patient on tomorrow 09/01/15

## 2015-08-31 NOTE — Progress Notes (Signed)
SLP Cancellation Note  Patient Details Name: EMONE ONDER MRN: [id number] DOB: 1956/01/02   Cancelled treatment:       Reason Eval/Treat Not Completed: Other (comment) Pt in the middle of getting cleaned upon SLP arrival. Family requests that SLP return at a later time. Informed pt/family that we could f/u on next date. They are agreeable to wait until then.   Germain Osgood, M.A. CCC-SLP [phone]  Germain Osgood 08/31/2015, 3:57 PM

## 2015-08-31 NOTE — Progress Notes (Signed)
Echocardiogram 2D Echocardiogram has been performed.  Joelene Millin 08/31/2015, 3:23 PM

## 2015-09-01 ENCOUNTER — Encounter (HOSPITAL_COMMUNITY)
Admission: EM | Disposition: A | Discharge status: Home Health | Payer: Self-pay | Source: Home / Self Care | Attending: Internal Medicine

## 2015-09-01 ENCOUNTER — Encounter (HOSPITAL_COMMUNITY): Payer: Self-pay | Admitting: Certified Registered Nurse Anesthetist

## 2015-09-01 ENCOUNTER — Ambulatory Visit: Payer: Self-pay | Admitting: Otolaryngology

## 2015-09-01 DIAGNOSIS — N390 Urinary tract infection, site not specified: Secondary | ICD-10-CM

## 2015-09-01 LAB — CBC
HCT: 24.7 % — ABNORMAL LOW (ref 36.0–46.0)
HEMATOCRIT: 19.6 % — AB (ref 36.0–46.0)
HEMATOCRIT: 20.8 % — AB (ref 36.0–46.0)
HEMOGLOBIN: 6.2 g/dL — AB (ref 12.0–15.0)
Hemoglobin: 6.7 g/dL — CL (ref 12.0–15.0)
Hemoglobin: 8 g/dL — ABNORMAL LOW (ref 12.0–15.0)
MCH: 26.6 pg (ref 26.0–34.0)
MCH: 27.1 pg (ref 26.0–34.0)
MCH: 27.1 pg (ref 26.0–34.0)
MCHC: 31.6 g/dL (ref 30.0–36.0)
MCHC: 32.2 g/dL (ref 30.0–36.0)
MCHC: 32.4 g/dL (ref 30.0–36.0)
MCV: 84.1 fL (ref 78.0–100.0)
MCV: 84.2 fL (ref 78.0–100.0)
MCV: 83.7 fL (ref 78.0–100.0)
PLATELETS: 197 10*3/uL (ref 150–400)
PLATELETS: 203 10*3/uL (ref 150–400)
Platelets: 192 10*3/uL (ref 150–400)
RBC: 2.33 MIL/uL — ABNORMAL LOW (ref 3.87–5.11)
RBC: 2.47 MIL/uL — AB (ref 3.87–5.11)
RBC: 2.95 MIL/uL — ABNORMAL LOW (ref 3.87–5.11)
RDW: 14.4 % (ref 11.5–15.5)
RDW: 14.3 % (ref 11.5–15.5)
RDW: 14.1 % (ref 11.5–15.5)
WBC: 16.8 10*3/uL — ABNORMAL HIGH (ref 4.0–10.5)
WBC: 19.1 10*3/uL — ABNORMAL HIGH (ref 4.0–10.5)
WBC: 18.9 10*3/uL — ABNORMAL HIGH (ref 4.0–10.5)

## 2015-09-01 LAB — PREPARE RBC (CROSSMATCH)

## 2015-09-01 LAB — BASIC METABOLIC PANEL
Anion gap: 5 (ref 5–15)
BUN: 28 mg/dL — ABNORMAL HIGH (ref 6–20)
CHLORIDE: 116 mmol/L — AB (ref 101–111)
CO2: 27 mmol/L (ref 22–32)
CREATININE: 0.84 mg/dL (ref 0.44–1.00)
Calcium: 8.2 mg/dL — ABNORMAL LOW (ref 8.9–10.3)
GFR calc non Af Amer: >60 mL/min (ref >60)
Glucose, Bld: 127 mg/dL — ABNORMAL HIGH (ref 65–99)
Potassium: 3.8 mmol/L (ref 3.5–5.1)
Sodium: 148 mmol/L — ABNORMAL HIGH (ref 135–145)

## 2015-09-01 LAB — GLUCOSE, CAPILLARY
GLUCOSE-CAPILLARY: 149 mg/dL — AB (ref 65–99)
GLUCOSE-CAPILLARY: 158 mg/dL — AB (ref 65–99)
GLUCOSE-CAPILLARY: 85 mg/dL (ref 65–99)
Glucose-Capillary: 128 mg/dL — ABNORMAL HIGH (ref 65–99)

## 2015-09-01 LAB — URINE CULTURE

## 2015-09-01 LAB — SURGICAL PCR SCREEN
MRSA, PCR: NEGATIVE
Staphylococcus aureus: NEGATIVE

## 2015-09-01 SURGERY — LARYNGOSCOPY, DIRECT
Anesthesia: General

## 2015-09-01 MED ORDER — SODIUM CHLORIDE 0.45 % IV SOLN
INTRAVENOUS | Status: DC
  Administered 2015-09-01 – 2015-09-03 (×3): via INTRAVENOUS

## 2015-09-01 MED ORDER — PROPOFOL 10 MG/ML IV BOLUS
INTRAVENOUS | Status: AC
  Filled 2015-09-01: qty 20

## 2015-09-01 MED ORDER — LIDOCAINE HCL (CARDIAC) 20 MG/ML IV SOLN
INTRAVENOUS | Status: AC
  Filled 2015-09-01: qty 5

## 2015-09-01 MED ORDER — SODIUM CHLORIDE 0.9 % IV SOLN
Freq: Once | INTRAVENOUS | Status: AC
  Administered 2015-09-01: 12:00:00 via INTRAVENOUS

## 2015-09-01 MED ORDER — FUROSEMIDE 10 MG/ML IJ SOLN
20.0000 mg | Freq: Once | INTRAMUSCULAR | Status: AC
  Administered 2015-09-01: 20 mg via INTRAVENOUS
  Filled 2015-09-01: qty 2

## 2015-09-01 MED ORDER — MIDAZOLAM HCL 2 MG/2ML IJ SOLN
INTRAMUSCULAR | Status: AC
  Filled 2015-09-01: qty 2

## 2015-09-01 MED ORDER — ACETAMINOPHEN 325 MG PO TABS: 650.0000 mg | ORAL_TABLET | Freq: Once | ORAL | Status: DC

## 2015-09-01 MED ORDER — ROCURONIUM BROMIDE 50 MG/5ML IV SOLN
INTRAVENOUS | Status: AC
  Filled 2015-09-01: qty 2

## 2015-09-01 MED ORDER — MORPHINE SULFATE (PF) 2 MG/ML IV SOLN
1.0000 mg | INTRAVENOUS | Status: AC
  Administered 2015-09-01: 1 mg via INTRAVENOUS
  Filled 2015-09-01: qty 1

## 2015-09-01 MED ORDER — FENTANYL CITRATE (PF) 250 MCG/5ML IJ SOLN
INTRAMUSCULAR | Status: AC
  Filled 2015-09-01: qty 5

## 2015-09-01 MED ORDER — DIPHENHYDRAMINE HCL 50 MG/ML IJ SOLN
25.0000 mg | Freq: Once | INTRAMUSCULAR | Status: AC
  Administered 2015-09-01: 25 mg via INTRAVENOUS
  Filled 2015-09-01: qty 1

## 2015-09-01 NOTE — Progress Notes (Signed)
Per Dr. Glennon Mac, due to anemia and the need for blood transfusions, surgery cancelled for today. Okay for pt to eat and drink per Dr. Glennon Mac, water given. Report called to RN on [address], this nurse called OR desk to arrange for transportation back. First unit of blood started, this nurse remained with pt. No S/S of reaction.

## 2015-09-01 NOTE — Evaluation (Signed)
Clinical/Bedside Swallow Evaluation Patient Details  Name: Ashley Ramsey MRN: [id number] Date of Birth: [birth year]  Today's Date: 09/01/2015 Time:        Past Medical History:  Past Medical History  Diagnosis Date  . Thyroid disease   . Hypertension   . Neck mass hospitalized 08/29/2015  . Type II diabetes mellitus ([place])    Past Surgical History:  Past Surgical History  Procedure Laterality Date  . No past surgeries     HPI:  Pt is 60 y.o. female with h/o hypertension and NIDDM. Pt presented to ED with voice changes, difficult swallowing and abdominal discomfort that have progressed over the last month. 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   Assessment / Plan / Recommendation Clinical Impression  SLP provided pt with thin liquids and puree at bedside to assess swallow function. Pt's daughter reported odynophagia for the past 3-4 months. Oral holding was observed due to pain. Pharyngeal phase impairments noted include immediate throat clearing and coughing and wet vocal quality. Pt regurgitated all PO after intake of puree and f/u this with thin water. Pt and pt's daughter were educated re: further instrumental testing and diet recommendation. Recommend pt remain NPO except for ice chips per free water protocol until results of MBSS are determined.     Aspiration Risk  Severe aspiration risk    Diet Recommendation NPO   Medication Administration: Via alternative means    Other  Recommendations Oral Care Recommendations: Oral care QID   Follow up Recommendations   (TBD)      Swallow Study   General HPI: Pt is 60 y.o. female with h/o hypertension and NIDDM. Pt presented to ED with voice changes, difficult swallowing and abdominal discomfort that have progressed over the last month. 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 Type of Study: Bedside Swallow  Evaluation Previous Swallow Assessment: none found Diet Prior to this Study: Dysphagia 2 (chopped);Thin liquids Temperature Spikes Noted: No Respiratory Status: Room air History of Recent Intubation: No Behavior/Cognition: Alert;Cooperative;Doesn't follow directions Oral Cavity Assessment: Within Functional Limits Oral Care Completed by SLP: No Oral Cavity - Dentition: Adequate natural dentition Vision: Functional for self-feeding Self-Feeding Abilities: Able to feed self Patient Positioning: Upright in chair Baseline Vocal Quality: Hoarse;Breathy;Low vocal intensity Volitional Cough: Weak (reports it was painful) Volitional Swallow: Able to elicit    Oral/Motor/Sensory Function Overall Oral Motor/Sensory Function: Within functional limits   Ice Chips Ice chips: Not tested   Thin Liquid Thin Liquid: Impaired Presentation: Cup;Self Fed Oral Phase Functional Implications: Oral holding;Other (comment) (cautious due to pain when swallowing) Pharyngeal  Phase Impairments: Wet Vocal Quality;Throat Clearing - Immediate;Cough - Immediate    Nectar Thick Nectar Thick Liquid: Not tested   Honey Thick Honey Thick Liquid: Not tested   Puree Puree: Impaired Presentation: Self Fed;Spoon Oral Phase Functional Implications: Oral holding Pharyngeal Phase Impairments: Throat Clearing - Immediate;Wet Vocal Quality;Cough - Delayed   Solid   GO   Solid: Not tested        Titus Mould 09/01/2015,3:35 PM   Titus Mould, Student-SLP

## 2015-09-01 NOTE — Progress Notes (Signed)
PATIENT DETAILS Name: Ashley Ramsey Age: 60 y.o. Sex: female Date of Birth: 10-18-1955 Admit Date: 08/29/2015 Admitting Physician [name], MD [id number], [name], MD  Subjective: 1 episodes of hematochezia last night, none documented today.   Assessment/Plan: Active Problems: Probable head and neck cancer: ENT input appreciated, plan for inpatient biopsy when hgb stable - subsequently will need referral Oncology.   Left renal mass: Highly suspicious for renal cell carcinoma. Doubt it is related to above. Spoke with Dr. Ree Kida MD on 1/17,he reviewed patient's CT scan in chart - recommends outpatient follow-up with him in the office for consideration of nephrectomy in the future.  No history of hematuria. Renal function stable within normal limits.   E coli UTI with SIRS: Sensitivities noted, continue Rocephin. Afebrile but WBC fluctuating.  Lower GI bleeding: Suspect Diverticular bleed-Ongoing for the past 1 week intermittently-has had a few episodes of hematochezia in the hospital. CT scan abdomen shows diverticulosis. GI recommends holding off on colonoscopy at this time due to risks of the procedure and the need to address more pressing issues- if bleeding progresses this may need to be readdressed. Transfusing 2 units today, follow CBC and transfuse as needed. GI signed off.  Acute blood loss anemia: Likely secondary to hematochezia, probably has chronic anemia from chronic disease. Transfuse additional 2 units today, Hgb 6.7 today, follow. Transfuse as needed.  Acute renal failure: Likely prerenal azotemia in a setting of UTI/GI bleeding-along with lisinopril/HCTZ use.  Resolved with hydration. Continue to monitor BMP daily given acute blood loss.  Hypernatremia:change to 0.45 NS-follow  Hypokalemia: Repleted  Type 2 diabetes: Controlled with CBGs 110-130s. Continue SSI, hold metformin. Follow CBGs  Hypertension: Controlled. Continue to hold  lisinopril/HCTZ.   Tobacco abuse: Counseled  Alcohol abuse: Counseled, Ativan per CIWA protocol  Left adrenal gland nodule: Will need outpatient follow-up, especially given left adrenal mass.  Subpleural nodules in the right middle lobe of right lung and left lower lobe of left lung: Will need to repeat CT chest in 6 months. Stable on room air, no shortness of breath  Possible CAD: Atherosclerosis seen CT chest. Unfortunately with ongoing lower GI bleeding-unable to use antiplatelets. Echo with LFEV 0000000, grade 1 diastolic dysfunction, no wall motion abnormalities. Given numerous above medical problems-not a candidate for any procedures/further workup at this point.  Morbid obesity: Has lost significant amount of weight over the past few months.  Disposition: Remains inpatient  Antimicrobial agents  See below  Anti-infectives    Start     Dose/Rate Route Frequency Ordered Stop   08/29/15 2330  [MAR Hold]  cefTRIAXone (ROCEPHIN) 1 g in dextrose 5 % 50 mL IVPB     (MAR Hold since 09/01/15 1008)   1 g 100 mL/hr over 30 Minutes Intravenous Every 24 hours 08/29/15 2250        DVT Prophylaxis: SCD's, no pharmacological DVT ppx given GI bleed/anemia  Code Status: Full code   Family Communication None at bedside.  Procedures: ECHO- TTE Study Conclusions - Left ventricle: The cavity size was normal. Wall thickness was normal. Systolic function was normal. The estimated ejectionfraction was in the range of 55% to 60%. Wall motion was normal;there were no regional wall motion abnormalities. Dopplerparameters are consistent with abnormal left ventricularrelaxation (grade 1 diastolic dysfunction). Doppler parametersare consistent with high ventricular filling pressure. - Aortic valve: There was very mild stenosis. There was trivialregurgitation. - Mitral valve:  Calcified annulus. Mildly thickened leaflets . - Left atrium: The atrium was mildly dilated.  CONSULTS:  GI,  urology and ent  Time spent 30 minutes-Greater than 50% of this time was spent in counseling, explanation of diagnosis, planning of further management, and coordination of care.  MEDICATIONS: Scheduled Meds: . [MAR Hold] sodium chloride   Intravenous Once  . sodium chloride   Intravenous Once  . acetaminophen  650 mg Oral Once  . [MAR Hold] cefTRIAXone (ROCEPHIN)  IV  1 g Intravenous Q24H  . diphenhydrAMINE  25 mg Intravenous Once  . [MAR Hold] folic acid  1 mg Oral Daily  . furosemide  20 mg Intravenous Once  . [MAR Hold] Influenza vac split quadrivalent PF  0.5 mL Intramuscular Tomorrow-1000  . [MAR Hold] insulin aspart  0-9 Units Subcutaneous TID WC  . [MAR Hold] multivitamin with minerals  1 tablet Oral Daily  . [MAR Hold] pantoprazole (PROTONIX) IV  40 mg Intravenous Q12H  . [MAR Hold] thiamine  100 mg Oral Daily   Or  . [MAR Hold] thiamine  100 mg Intravenous Daily   Continuous Infusions: . sodium chloride 75 mL/hr at 09/01/15 0946   PRN Meds:.[MAR Hold] acetaminophen **OR** [MAR Hold] acetaminophen, [MAR Hold] HYDROcodone-acetaminophen, [MAR Hold] lidocaine, [MAR Hold] LORazepam **OR** [MAR Hold] LORazepam, [MAR Hold] ondansetron **OR** [MAR Hold] ondansetron (ZOFRAN) IV, [MAR Hold] oxymetazoline    PHYSICAL EXAM: Vital signs in last 24 hours: Filed Vitals:   08/31/15 2123 08/31/15 2123 08/31/15 2345 09/01/15 0538  BP: 86/64 75/52 87/46  117/61  Pulse: 114 114 99 99  Temp: 98 F (36.7 C) 98 F (36.7 C) 97.4 F (36.3 C) 99.3 F (37.4 C)  TempSrc: Oral Oral Oral Oral  Resp: 18 18 20 15   Height:      Weight:      SpO2:  100% 100% 77%    Weight change:  Filed Weights   08/29/15 1422  Weight: 113.399 kg (250 lb)   Body mass index is 44.3 kg/(m^2).   Gen Exam: Awake and alert with clear speech. Obese. Voice is very hoarse and weak. Neck: Supple-exam limited by body habitus. No stridor Chest:  CVS: S1 S2 regular. Abdomen: soft, non tender Extremities:    Neurologic: Non Focal Psych: cooperative Wounds: N/A.   Intake/Output from previous day:  Intake/Output Summary (Last 24 hours) at 09/01/15 1051 Last data filed at 09/01/15 0542  Gross per 24 hour  Intake    240 ml  Output    450 ml  Net   -210 ml     LAB RESULTS: CBC  Recent Labs Lab 08/31/15 1028 08/31/15 1029 08/31/15 2057 09/01/15 0511 09/01/15 0735  WBC 19.1* 19.9* 17.2* 16.8* 19.1*  HGB 8.0* 8.2* 7.3* 6.2* 6.7*  HCT 25.6* 25.2* 22.9* 19.6* 20.8*  PLT 196 206 199 192 197  MCV 83.7 81.8 82.4 84.1 84.2  MCH 26.1 26.6 26.3 26.6 27.1  MCHC 31.3 32.5 31.9 31.6 32.2  RDW 13.9 13.8 13.9 14.4 14.3    Chemistries   Recent Labs Lab 08/29/15 1446 08/30/15 0142 08/31/15 1028 09/01/15 0511  NA 138 137 144 148*  K 3.1* 2.9* 3.9 3.8  CL 96* 96* 113* 116*  CO2 27 31 24 27   GLUCOSE 166* 165* 174* 127*  BUN 53* 56* 36* 28*  CREATININE 1.47* 1.46* 0.88 0.84  CALCIUM 9.1 8.3* 8.6* 8.2*  MG  --  1.5* 1.8  --     CBG:  Recent Labs Lab 08/31/15 0753 08/31/15  1221 08/31/15 1702 08/31/15 2122 09/01/15 0755  GLUCAP 97 139* 117* 137* 128*    GFR Estimated Creatinine Clearance: 87.4 mL/min (by C-G formula based on Cr of 0.84).  Coagulation profile No results for input(s): INR, PROTIME in the last 168 hours.  Cardiac Enzymes No results for input(s): CKMB, TROPONINI, MYOGLOBIN in the last 168 hours.  Invalid input(s): CK  Invalid input(s): POCBNP No results for input(s): DDIMER in the last 72 hours. No results for input(s): HGBA1C in the last 72 hours. No results for input(s): CHOL, HDL, LDLCALC, TRIG, CHOLHDL, LDLDIRECT in the last 72 hours. No results for input(s): TSH, T4TOTAL, T3FREE, THYROIDAB in the last 72 hours.  Invalid input(s): FREET3  Recent Labs  08/30/15 1630 08/30/15 2200  VITAMINB12  --  801  FOLATE  --  17.3  FERRITIN  --  333*  TIBC  --  207*  IRON  --  38  RETICCTPCT 1.5  --     Recent Labs  08/29/15 1446  LIPASE 22     Urine Studies No results for input(s): UHGB, CRYS in the last 72 hours.  Invalid input(s): UACOL, UAPR, USPG, UPH, UTP, UGL, UKET, UBIL, UNIT, UROB, ULEU, UEPI, UWBC, URBC, UBAC, CAST, UCOM, BILUA  MICROBIOLOGY: Recent Results (from the past 240 hour(s))  Culture, Urine     Status: None   Collection Time: 08/29/15  9:09 PM  Result Value Ref Range Status   Specimen Description URINE, CATHETERIZED  Final   Special Requests NONE  Final   Culture >=100,000 COLONIES/mL ESCHERICHIA COLI  Final   Report Status 09/01/2015 FINAL  Final   Organism ID, Bacteria ESCHERICHIA COLI  Final      Susceptibility   Escherichia coli - MIC*    AMPICILLIN >=32 RESISTANT Resistant     CEFAZOLIN <=4 SENSITIVE Sensitive     CEFTRIAXONE <=1 SENSITIVE Sensitive     CIPROFLOXACIN <=0.25 SENSITIVE Sensitive     GENTAMICIN <=1 SENSITIVE Sensitive     IMIPENEM <=0.25 SENSITIVE Sensitive     NITROFURANTOIN <=16 SENSITIVE Sensitive     TRIMETH/SULFA <=20 SENSITIVE Sensitive     AMPICILLIN/SULBACTAM 4 SENSITIVE Sensitive     PIP/TAZO <=4 SENSITIVE Sensitive     * >=100,000 COLONIES/mL ESCHERICHIA COLI  Surgical pcr screen     Status: None   Collection Time: 09/01/15  4:09 AM  Result Value Ref Range Status   MRSA, PCR NEGATIVE NEGATIVE Final   Staphylococcus aureus NEGATIVE NEGATIVE Final    Comment:        The Xpert SA Assay (FDA approved for NASAL specimens in patients over 83 years of age), is one component of a comprehensive surveillance program.  Test performance has been validated by [facility] for patients greater than or equal to 42 year old. It is not intended to diagnose infection nor to guide or monitor treatment.     RADIOLOGY STUDIES/RESULTS: Dg Chest 2 View  08/29/2015  CLINICAL DATA:  One month history of chest pain and hoarse voice. EXAM: CHEST  2 VIEW COMPARISON:  None. FINDINGS: The heart is borderline and enlarged. Mild tortuosity of the thoracic aorta. Low lung volumes with  mild vascular crowding and streaky basilar atelectasis. There also bronchitic changes which could be acute or chronic. No infiltrates or effusions. The bony thorax is intact. IMPRESSION: Acute versus chronic bronchitic change.  No infiltrates or effusions Electronically Signed   By: Marijo Sanes M.D.   On: 08/29/2015 16:15   Ct Soft Tissue Neck W  Contrast  08/29/2015  CLINICAL DATA:  60 year old female with abnormal full waist for 1 month. Abdominal pain. Three days of vomiting. Initial encounter. EXAM: CT NECK WITH CONTRAST TECHNIQUE: Multidetector CT imaging of the neck was performed using the standard protocol following the bolus administration of intravenous contrast. CONTRAST:  21mL OMNIPAQUE IOHEXOL 300 MG/ML SOLN in conjunction with contrast enhanced imaging of the chest, abdomen, and pelvis reported separately. COMPARISON:  CT chest abdomen and pelvis from today reported separately FINDINGS: Pharynx and larynx: Bulky supraglottic laryngeal tumor with marked soft tissue enlargement of the bilateral aryepiglottic folds and anterior commissure up to 17 mm in thickness. Tumor appears inseparable from the undersurface of the strap muscles suggesting extension through the thyroid cartilage bilaterally. Posterior hypopharynx involvement. The epiglottis is thickened, nodular, and distorted. Furthermore, the vallecula is mostly effaced and soft tissue at the base of tongue appears nodular and thickened. All told, tumor encompasses 37 x 40 x 58 mm (AP by transverse by CC). The other laryngeal cartilages appear spared. The palatine tonsils, soft palate, and nasopharynx appear within normal limits. Superior parapharyngeal spaces and retropharyngeal space are within normal limits. Salivary glands: Sublingual space, submandibular glands, and parotid glands are within normal limits. Thyroid: Coarsely calcified 2.3 cm right thyroid nodule. Subcentimeter left lobe nodule. Lymph nodes: Abnormal right level 3 node  measuring 9 mm at the level of the thyroid cartilage (series 1, image 57). Small but asymmetric 5 mm right level IIIa lymph node at the level of the hyoid bone on the right (series 1, image 49). Bilateral level 2A nodes appear symmetric measuring 8-9 mm in thickness. No level 4, 5, or level 1 lymphadenopathy. Vascular: Suboptimal intravascular contrast timing, but major vascular structures in the neck and at the skullbase appear to remain patent. Left greater than right carotid bifurcation calcified atherosclerosis. Limited intracranial: Negative.  Partially empty sella. Visualized orbits: Negative. Mastoids and visualized paranasal sinuses: Visualized paranasal sinuses and mastoids are clear. Skeleton: Mostly absent dentition. Periapical lucency about the residual left mandible molar. No osseous metastatic disease identified. Upper chest: Reported separately today. IMPRESSION: 1. 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 2. Suspect metastatic nodal disease at level 3 on the right (small but asymmetric up to 9 mm nodes). Bilateral level 2 nodes are indeterminate and also measure up to 9 mm individually. No cystic or necrotic nodes in the neck. 3. See CT chest abdomen and pelvis from today reported separately. Electronically Signed   By: Genevie Ann M.D.   On: 08/29/2015 19:56   Ct Chest W Contrast  08/29/2015  CLINICAL DATA:  60 year old female with 2-3 day history of vomiting. Abdominal pain for the past month. Loss of voice. EXAM: CT CHEST, ABDOMEN, AND PELVIS WITH CONTRAST TECHNIQUE: Multidetector CT imaging of the chest, abdomen and pelvis was performed following the standard protocol during bolus administration of intravenous contrast. CONTRAST:  102mL OMNIPAQUE IOHEXOL 300 MG/ML  SOLN COMPARISON:  No priors. FINDINGS: CT CHEST FINDINGS Mediastinum/Lymph Nodes: Heart size is normal. There is no significant pericardial  fluid, thickening or pericardial calcification. There is atherosclerosis of the thoracic aorta, the great vessels of the mediastinum and the coronary arteries, including calcified atherosclerotic plaque in the left main, left anterior descending, left circumflex and right coronary arteries. Calcifications of the aortic valve and mitral valve/annulus. No pathologically enlarged mediastinal or hilar lymph nodes. Esophagus is unremarkable in appearance. No axillary lymphadenopathy. Lungs/Pleura: 4 mm  subpleural nodule in the lateral segment of the right middle lobe (image 31 of series 4). 4 mm subpleural nodule in the posterior left lower lobe (image 38 of series 4). No other suspicious appearing pulmonary nodules or masses. No acute consolidative airspace disease. No pleural effusions. Musculoskeletal/Soft Tissues: There are no aggressive appearing lytic or blastic lesions noted in the visualized portions of the skeleton. CT ABDOMEN AND PELVIS FINDINGS Hepatobiliary: No cystic or solid hepatic lesions. No intra or extrahepatic biliary ductal dilatation. 7 mm calcified gallstone lying dependently in the gallbladder. No current findings to suggest an acute cholecystitis at this time. Pancreas: No pancreatic mass. No pancreatic ductal dilatation. No pancreatic or peripancreatic fluid or inflammatory changes. Spleen: Unremarkable. Adrenals/Urinary Tract: In the lower pole of the left kidney there is a 5.9 x 6.1 x 6.3 cm heterogeneously enhancing lesion highly concerning for renal cell carcinoma. The inferior aspect of this lesion comes in very close proximity to the anterior aspect of the left psoas muscle and quadratus lumborum muscle, however, there appears to be an intervening fat plane at this time. The lesion is well separated from the left renal vein. Right kidney and right adrenal gland are normal in appearance. 1.2 x 1.4 cm indeterminate nodule in the lateral limb of the left adrenal gland. No  hydroureteronephrosis. Urinary bladder is largely decompressed, but otherwise unremarkable in appearance. Stomach/Bowel: Normal appearance of the stomach. No pathologic dilatation of small bowel or colon. A few scattered colonic diverticulae are noted, without surrounding inflammatory changes to suggest an acute diverticulitis at this time. Appendix is normal. Vascular/Lymphatic: Atherosclerosis throughout the abdominal and pelvic vasculature, without evidence of aneurysm or dissection. Left renal vein is widely patent and separate from the lower pole mass. No lymphadenopathy noted in the abdomen or pelvis. Reproductive: Uterus and ovaries are unremarkable in appearance. Other: No significant volume of ascites.  No pneumoperitoneum. Musculoskeletal: There are no aggressive appearing lytic or blastic lesions noted in the visualized portions of the skeleton. IMPRESSION: 1. No acute findings in the abdomen or pelvis to account for the patient's symptoms. 2. However, there is a 5.9 x 6.1 x 6.3 cm heterogeneously enhancing mass in the lower pole of the left kidney highly concerning for renal cell carcinoma. At this time, the lesion appears likely to be encapsulated within Gerota's fascia (although it comes very close to the left psoas and quadratus lumborum musculature), does not involve the left renal vein, and does not appear to be associated with lymphadenopathy. Nonemergent Urologic consultation for surgical resection is strongly recommended in the near future. 3. 1.2 x 1.4 cm indeterminate nodule in the left adrenal gland. Attention on followup studies is recommended, as a metastatic lesion is not excluded. 4. 4 mm subpleural nodules in the right middle lobe and left lower lobe. These are highly nonspecific, and favored to represent subpleural lymph nodes, but attention on followup studies is recommended to ensure stability. 5. Cholelithiasis without evidence of acute cholecystitis at this time. 6. Atherosclerosis,  including left main and 3 vessel coronary artery disease. Please note that although the presence of coronary artery calcium documents the presence of coronary artery disease, the severity of this disease and any potential stenosis cannot be assessed on this non-gated CT examination. Assessment for potential risk factor modification, dietary therapy or pharmacologic therapy may be warranted, if clinically indicated. 7. There are calcifications of the aortic valve and mitral valve/annulus. Echocardiographic correlation for evaluation of potential valvular dysfunction may be warranted if clinically indicated. 8. Colonic  diverticulosis without evidence of acute diverticulitis at this time. 9. Additional incidental findings, as above. These results were called by telephone at the time of interpretation on 08/29/2015 at 7:34 pm to Dr. Davonna Belling, who verbally acknowledged these results. Electronically Signed   By: Vinnie Langton M.D.   On: 08/29/2015 19:37   Ct Abdomen Pelvis W Contrast  08/29/2015  CLINICAL DATA:  60 year old female with 2-3 day history of vomiting. Abdominal pain for the past month. Loss of voice. EXAM: CT CHEST, ABDOMEN, AND PELVIS WITH CONTRAST TECHNIQUE: Multidetector CT imaging of the chest, abdomen and pelvis was performed following the standard protocol during bolus administration of intravenous contrast. CONTRAST:  61mL OMNIPAQUE IOHEXOL 300 MG/ML  SOLN COMPARISON:  No priors. FINDINGS: CT CHEST FINDINGS Mediastinum/Lymph Nodes: Heart size is normal. There is no significant pericardial fluid, thickening or pericardial calcification. There is atherosclerosis of the thoracic aorta, the great vessels of the mediastinum and the coronary arteries, including calcified atherosclerotic plaque in the left main, left anterior descending, left circumflex and right coronary arteries. Calcifications of the aortic valve and mitral valve/annulus. No pathologically enlarged mediastinal or hilar lymph  nodes. Esophagus is unremarkable in appearance. No axillary lymphadenopathy. Lungs/Pleura: 4 mm subpleural nodule in the lateral segment of the right middle lobe (image 31 of series 4). 4 mm subpleural nodule in the posterior left lower lobe (image 38 of series 4). No other suspicious appearing pulmonary nodules or masses. No acute consolidative airspace disease. No pleural effusions. Musculoskeletal/Soft Tissues: There are no aggressive appearing lytic or blastic lesions noted in the visualized portions of the skeleton. CT ABDOMEN AND PELVIS FINDINGS Hepatobiliary: No cystic or solid hepatic lesions. No intra or extrahepatic biliary ductal dilatation. 7 mm calcified gallstone lying dependently in the gallbladder. No current findings to suggest an acute cholecystitis at this time. Pancreas: No pancreatic mass. No pancreatic ductal dilatation. No pancreatic or peripancreatic fluid or inflammatory changes. Spleen: Unremarkable. Adrenals/Urinary Tract: In the lower pole of the left kidney there is a 5.9 x 6.1 x 6.3 cm heterogeneously enhancing lesion highly concerning for renal cell carcinoma. The inferior aspect of this lesion comes in very close proximity to the anterior aspect of the left psoas muscle and quadratus lumborum muscle, however, there appears to be an intervening fat plane at this time. The lesion is well separated from the left renal vein. Right kidney and right adrenal gland are normal in appearance. 1.2 x 1.4 cm indeterminate nodule in the lateral limb of the left adrenal gland. No hydroureteronephrosis. Urinary bladder is largely decompressed, but otherwise unremarkable in appearance. Stomach/Bowel: Normal appearance of the stomach. No pathologic dilatation of small bowel or colon. A few scattered colonic diverticulae are noted, without surrounding inflammatory changes to suggest an acute diverticulitis at this time. Appendix is normal. Vascular/Lymphatic: Atherosclerosis throughout the abdominal and  pelvic vasculature, without evidence of aneurysm or dissection. Left renal vein is widely patent and separate from the lower pole mass. No lymphadenopathy noted in the abdomen or pelvis. Reproductive: Uterus and ovaries are unremarkable in appearance. Other: No significant volume of ascites.  No pneumoperitoneum. Musculoskeletal: There are no aggressive appearing lytic or blastic lesions noted in the visualized portions of the skeleton. IMPRESSION: 1. No acute findings in the abdomen or pelvis to account for the patient's symptoms. 2. However, there is a 5.9 x 6.1 x 6.3 cm heterogeneously enhancing mass in the lower pole of the left kidney highly concerning for renal cell carcinoma. At this time, the lesion appears  likely to be encapsulated within Gerota's fascia (although it comes very close to the left psoas and quadratus lumborum musculature), does not involve the left renal vein, and does not appear to be associated with lymphadenopathy. Nonemergent Urologic consultation for surgical resection is strongly recommended in the near future. 3. 1.2 x 1.4 cm indeterminate nodule in the left adrenal gland. Attention on followup studies is recommended, as a metastatic lesion is not excluded. 4. 4 mm subpleural nodules in the right middle lobe and left lower lobe. These are highly nonspecific, and favored to represent subpleural lymph nodes, but attention on followup studies is recommended to ensure stability. 5. Cholelithiasis without evidence of acute cholecystitis at this time. 6. Atherosclerosis, including left main and 3 vessel coronary artery disease. Please note that although the presence of coronary artery calcium documents the presence of coronary artery disease, the severity of this disease and any potential stenosis cannot be assessed on this non-gated CT examination. Assessment for potential risk factor modification, dietary therapy or pharmacologic therapy may be warranted, if clinically indicated. 7. There  are calcifications of the aortic valve and mitral valve/annulus. Echocardiographic correlation for evaluation of potential valvular dysfunction may be warranted if clinically indicated. 8. Colonic diverticulosis without evidence of acute diverticulitis at this time. 9. Additional incidental findings, as above. These results were called by telephone at the time of interpretation on 08/29/2015 at 7:34 pm to Dr. Davonna Belling, who verbally acknowledged these results. Electronically Signed   By: Vinnie Langton M.D.   On: 08/29/2015 19:37    [facility] [place] Pager:[phone]  If 7PM-7AM, please contact night-coverage [email or web address] Password [email or web address] 09/01/2015, 10:51 AM   LOS: 2 days

## 2015-09-01 NOTE — Anesthesia Preprocedure Evaluation (Deleted)
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 on medications   08/31/15 ECHO: Normal LV systolic function; grade 1 diastolic dysfunction; calcified aortic valve with very mild AS (mean gradient 12 mmHg   Neuro/Psych    GI/Hepatic Neg liver ROS,   Endo/Other  diabetes (glu 128), Oral Hypoglycemic AgentsMorbid obesityThyroid mass  Renal/GU negative Renal ROS     Musculoskeletal   Abdominal   Peds  Hematology  (+) Blood dyscrasia (Hb 6.2), ,   Anesthesia Other Findings   Reproductive/Obstetrics                           Anesthesia Physical Anesthesia Plan  ASA: IV  Anesthesia Plan:    Post-op Pain Management:    Induction:   Airway Management Planned:   Additional Equipment:   Intra-op Plan:   Post-operative Plan:   Informed Consent:   Plan Discussed with:   Anesthesia Plan Comments: (Pt with Hb 6.2, Dr. Constance Holster to postpone)        Anesthesia Quick Evaluation

## 2015-09-01 NOTE — Progress Notes (Signed)
SLP Cancellation Note  Patient Details Name: ROSINE LANGI MRN: [id number] DOB: [birth year]   Cancelled treatment:       Reason Eval/Treat Not Completed: Other (comment) Procedure canceled for today, but upon SLP arrival pt is int he middle of toileting. Will continue efforts as able.   Germain Osgood, M.A. CCC-SLP [phone]  Germain Osgood 09/01/2015, 12:41 PM

## 2015-09-01 NOTE — Progress Notes (Signed)
CRITICAL VALUE ALERT  Critical value received: Hgb 6.2  Date of notification: 09/01/2015   Time of notification: 0754   Critical value read back:yes  Nurse who received alert:Kelsei Defino Fredonia Highland RN  MD notified (1st page):  MD Ghimire  Time of first page:  [phone]  Responding MD:  MD Sloan Leiter  Time MD responded:  [phone]

## 2015-09-01 NOTE — Progress Notes (Signed)
RN made  MD aware face to face  that pt had burgundy color loose/pasty stool. Dorita Fray 09/01/2015 12:45

## 2015-09-01 NOTE — Progress Notes (Signed)
SLP Cancellation Note  Patient Details Name: Ashley Ramsey MRN: [id number] DOB: [birth year]   Cancelled treatment:       Reason Eval/Treat Not Completed: Medical issues which prohibited therapy - pt NPO for procedure. Will f/u as able.   Germain Osgood, M.A. CCC-SLP [phone]  Germain Osgood 09/01/2015, 8:53 AM

## 2015-09-01 NOTE — Progress Notes (Signed)
Patient ID: Ashley Ramsey, female   DOB: 06-11-1956, 60 y.o.   MRN: [id number]  Will postpone surgery due to severe anemia. Will need Hgb at least 8.0.

## 2015-09-02 ENCOUNTER — Inpatient Hospital Stay (HOSPITAL_COMMUNITY): Payer: Medicaid Other

## 2015-09-02 ENCOUNTER — Encounter (HOSPITAL_COMMUNITY)
Admission: EM | Disposition: A | Discharge status: Home Health | Payer: Self-pay | Source: Home / Self Care | Attending: Internal Medicine

## 2015-09-02 ENCOUNTER — Encounter (HOSPITAL_COMMUNITY): Payer: Self-pay | Admitting: Anesthesiology

## 2015-09-02 ENCOUNTER — Inpatient Hospital Stay (HOSPITAL_COMMUNITY): Payer: Medicaid Other | Admitting: Anesthesiology

## 2015-09-02 DIAGNOSIS — R634 Abnormal weight loss: Secondary | ICD-10-CM

## 2015-09-02 DIAGNOSIS — D631 Anemia in chronic kidney disease: Secondary | ICD-10-CM

## 2015-09-02 DIAGNOSIS — N189 Chronic kidney disease, unspecified: Secondary | ICD-10-CM

## 2015-09-02 DIAGNOSIS — N2889 Other specified disorders of kidney and ureter: Secondary | ICD-10-CM

## 2015-09-02 DIAGNOSIS — R109 Unspecified abdominal pain: Secondary | ICD-10-CM

## 2015-09-02 DIAGNOSIS — R221 Localized swelling, mass and lump, neck: Secondary | ICD-10-CM

## 2015-09-02 DIAGNOSIS — R490 Dysphonia: Secondary | ICD-10-CM

## 2015-09-02 DIAGNOSIS — R1310 Dysphagia, unspecified: Secondary | ICD-10-CM

## 2015-09-02 HISTORY — PX: ESOPHAGOSCOPY: SHX5534

## 2015-09-02 HISTORY — PX: DIRECT LARYNGOSCOPY: SHX5326

## 2015-09-02 LAB — CBC
HEMATOCRIT: 25.8 % — AB (ref 36.0–46.0)
HEMATOCRIT: 25 % — AB (ref 36.0–46.0)
HEMOGLOBIN: 8.3 g/dL — AB (ref 12.0–15.0)
Hemoglobin: 8.2 g/dL — ABNORMAL LOW (ref 12.0–15.0)
MCH: 26.8 pg (ref 26.0–34.0)
MCH: 27.5 pg (ref 26.0–34.0)
MCHC: 32.2 g/dL (ref 30.0–36.0)
MCHC: 32.8 g/dL (ref 30.0–36.0)
MCV: 83.2 fL (ref 78.0–100.0)
MCV: 83.9 fL (ref 78.0–100.0)
PLATELETS: 202 10*3/uL (ref 150–400)
Platelets: 191 10*3/uL (ref 150–400)
RBC: 3.1 MIL/uL — ABNORMAL LOW (ref 3.87–5.11)
RBC: 2.98 MIL/uL — AB (ref 3.87–5.11)
RDW: 14.5 % (ref 11.5–15.5)
RDW: 14.8 % (ref 11.5–15.5)
WBC: 17 10*3/uL — AB (ref 4.0–10.5)
WBC: 18.5 10*3/uL — ABNORMAL HIGH (ref 4.0–10.5)

## 2015-09-02 LAB — GLUCOSE, CAPILLARY
GLUCOSE-CAPILLARY: 89 mg/dL (ref 65–99)
GLUCOSE-CAPILLARY: 92 mg/dL (ref 65–99)
Glucose-Capillary: 90 mg/dL (ref 65–99)
Glucose-Capillary: 119 mg/dL — ABNORMAL HIGH (ref 65–99)
Glucose-Capillary: 139 mg/dL — ABNORMAL HIGH (ref 65–99)

## 2015-09-02 LAB — TYPE AND SCREEN
ABO/RH(D): B POS
Antibody Screen: NEGATIVE
UNIT DIVISION: 0
UNIT DIVISION: 0
Unit division: 0

## 2015-09-02 LAB — BASIC METABOLIC PANEL
ANION GAP: 10 (ref 5–15)
BUN: 22 mg/dL — ABNORMAL HIGH (ref 6–20)
CALCIUM: 8.4 mg/dL — AB (ref 8.9–10.3)
CHLORIDE: 112 mmol/L — AB (ref 101–111)
CO2: 24 mmol/L (ref 22–32)
Creatinine, Ser: 0.82 mg/dL (ref 0.44–1.00)
GFR calc Af Amer: >60 mL/min (ref >60)
GFR calc non Af Amer: >60 mL/min (ref >60)
GLUCOSE: 101 mg/dL — AB (ref 65–99)
Potassium: 3.6 mmol/L (ref 3.5–5.1)
Sodium: 146 mmol/L — ABNORMAL HIGH (ref 135–145)

## 2015-09-02 SURGERY — LARYNGOSCOPY, DIRECT
Anesthesia: General

## 2015-09-02 MED ORDER — FENTANYL CITRATE (PF) 100 MCG/2ML IJ SOLN: 25.0000 ug | INTRAMUSCULAR | Status: DC | PRN

## 2015-09-02 MED ORDER — LIDOCAINE-EPINEPHRINE 1 %-1:100000 IJ SOLN
INTRAMUSCULAR | Status: AC
  Filled 2015-09-02: qty 1

## 2015-09-02 MED ORDER — GLYCOPYRROLATE 0.2 MG/ML IJ SOLN
INTRAMUSCULAR | Status: DC | PRN
  Administered 2015-09-02: 0.2 mg via INTRAVENOUS

## 2015-09-02 MED ORDER — ONDANSETRON HCL 4 MG/2ML IJ SOLN
INTRAMUSCULAR | Status: DC | PRN
  Administered 2015-09-02: 4 mg via INTRAVENOUS

## 2015-09-02 MED ORDER — LIDOCAINE HCL (CARDIAC) 20 MG/ML IV SOLN
INTRAVENOUS | Status: AC
  Filled 2015-09-02: qty 5

## 2015-09-02 MED ORDER — ROCURONIUM BROMIDE 50 MG/5ML IV SOLN
INTRAVENOUS | Status: AC
  Filled 2015-09-02: qty 1

## 2015-09-02 MED ORDER — REMIFENTANIL HCL 2 MG IV SOLR: INTRAVENOUS | Status: DC | PRN

## 2015-09-02 MED ORDER — ROCURONIUM BROMIDE 100 MG/10ML IV SOLN
INTRAVENOUS | Status: DC | PRN
  Administered 2015-09-02: 20 mg via INTRAVENOUS

## 2015-09-02 MED ORDER — DEXAMETHASONE SODIUM PHOSPHATE 10 MG/ML IJ SOLN
INTRAMUSCULAR | Status: DC | PRN
  Administered 2015-09-02: 10 mg via INTRAVENOUS

## 2015-09-02 MED ORDER — FENTANYL CITRATE (PF) 250 MCG/5ML IJ SOLN
INTRAMUSCULAR | Status: AC
  Filled 2015-09-02: qty 5

## 2015-09-02 MED ORDER — SUGAMMADEX SODIUM 200 MG/2ML IV SOLN
INTRAVENOUS | Status: AC
  Filled 2015-09-02: qty 2

## 2015-09-02 MED ORDER — MIDAZOLAM HCL 2 MG/2ML IJ SOLN
INTRAMUSCULAR | Status: AC
  Filled 2015-09-02: qty 2

## 2015-09-02 MED ORDER — 0.9 % SODIUM CHLORIDE (POUR BTL) OPTIME
TOPICAL | Status: DC | PRN
  Administered 2015-09-02: 1000 mL

## 2015-09-02 MED ORDER — EPINEPHRINE HCL (NASAL) 0.1 % NA SOLN
NASAL | Status: AC
  Filled 2015-09-02: qty 30

## 2015-09-02 MED ORDER — ONDANSETRON HCL 4 MG/2ML IJ SOLN: 4.0000 mg | Freq: Once | INTRAMUSCULAR | Status: DC | PRN

## 2015-09-02 MED ORDER — DEXTROSE 5 % IV SOLN
10.0000 mg | INTRAVENOUS | Status: DC | PRN
  Administered 2015-09-02: 80 ug/min via INTRAVENOUS

## 2015-09-02 MED ORDER — EPINEPHRINE HCL (NASAL) 0.1 % NA SOLN
NASAL | Status: DC | PRN
  Administered 2015-09-02: 30 mL via TOPICAL

## 2015-09-02 MED ORDER — PROPOFOL 10 MG/ML IV BOLUS
INTRAVENOUS | Status: DC | PRN
  Administered 2015-09-02 (×2): 50 mg via INTRAVENOUS

## 2015-09-02 MED ORDER — SUGAMMADEX SODIUM 200 MG/2ML IV SOLN
INTRAVENOUS | Status: DC | PRN
  Administered 2015-09-02: 200 mg via INTRAVENOUS

## 2015-09-02 MED ORDER — SODIUM CHLORIDE 0.9 % IV SOLN
0.0125 ug/kg/min | INTRAVENOUS | Status: AC
  Administered 2015-09-02: .1 ug/kg/min via INTRAVENOUS
  Filled 2015-09-02: qty 2000

## 2015-09-02 MED ORDER — LACTATED RINGERS IV SOLN
INTRAVENOUS | Status: DC
  Administered 2015-09-02 (×2): via INTRAVENOUS
  Administered 2015-09-05: 1000 mL via INTRAVENOUS

## 2015-09-02 MED ORDER — MORPHINE SULFATE (PF) 2 MG/ML IV SOLN
1.0000 mg | INTRAVENOUS | Status: DC | PRN
  Administered 2015-09-02 – 2015-09-05 (×3): 1 mg via INTRAVENOUS
  Filled 2015-09-02 (×3): qty 1

## 2015-09-02 SURGICAL SUPPLY — 43 items
APL SKNCLS STERI-STRIP NONHPOA (GAUZE/BANDAGES/DRESSINGS)
BALLN PULM 15 16.5 18 X 75CM (BALLOONS)
BALLN PULM 15 16.5 18X75 (BALLOONS)
BALLOON PULM 15 16.5 18X75 (BALLOONS) IMPLANT
BENZOIN TINCTURE PRP APPL 2/3 (GAUZE/BANDAGES/DRESSINGS) IMPLANT
BLADE SURG 15 STRL LF DISP TIS (BLADE) IMPLANT
BLADE SURG 15 STRL SS (BLADE)
CANISTER SUCTION 2500CC (MISCELLANEOUS) ×3 IMPLANT
CLEANER TIP ELECTROSURG 2X2 (MISCELLANEOUS) ×3 IMPLANT
CONT SPEC 4OZ CLIKSEAL STRL BL (MISCELLANEOUS) ×2 IMPLANT
COVER MAYO STAND STRL (DRAPES) ×3 IMPLANT
COVER SURGICAL LIGHT HANDLE (MISCELLANEOUS) ×3 IMPLANT
COVER TABLE BACK 60X90 (DRAPES) ×3 IMPLANT
DECANTER SPIKE VIAL GLASS SM (MISCELLANEOUS) ×1 IMPLANT
DRAPE PROXIMA HALF (DRAPES) ×3 IMPLANT
ELECT COATED BLADE 2.86 ST (ELECTRODE) ×3 IMPLANT
ELECT REM PT RETURN 9FT ADLT (ELECTROSURGICAL) ×3
ELECTRODE REM PT RTRN 9FT ADLT (ELECTROSURGICAL) ×1 IMPLANT
GAUZE SPONGE 4X4 12PLY STRL (GAUZE/BANDAGES/DRESSINGS) ×3 IMPLANT
GAUZE SPONGE 4X4 16PLY XRAY LF (GAUZE/BANDAGES/DRESSINGS) ×3 IMPLANT
GLOVE ECLIPSE 7.5 STRL STRAW (GLOVE) ×3 IMPLANT
GOWN STRL REUS W/ TWL LRG LVL3 (GOWN DISPOSABLE) ×2 IMPLANT
GOWN STRL REUS W/TWL LRG LVL3 (GOWN DISPOSABLE) ×6
KIT BASIN OR (CUSTOM PROCEDURE TRAY) ×3 IMPLANT
KIT ROOM TURNOVER OR (KITS) ×3 IMPLANT
MARKER SKIN DUAL TIP RULER LAB (MISCELLANEOUS) IMPLANT
NEEDLE 27GAX1X1/2 (NEEDLE) ×3 IMPLANT
NS IRRIG 1000ML POUR BTL (IV SOLUTION) ×3 IMPLANT
PACK EENT II TURBAN DRAPE (CUSTOM PROCEDURE TRAY) ×3 IMPLANT
PAD ARMBOARD 7.5X6 YLW CONV (MISCELLANEOUS) ×6 IMPLANT
PATTIES SURGICAL .5 X3 (DISPOSABLE) ×3 IMPLANT
PENCIL FOOT CONTROL (ELECTRODE) ×3 IMPLANT
SOLUTION ANTI FOG 6CC (MISCELLANEOUS) IMPLANT
SUT CHROMIC 2 0 SH (SUTURE) ×3 IMPLANT
SUT ETHILON 3 0 PS 1 (SUTURE) ×3 IMPLANT
SUT SILK 4 0 TIE 10X30 (SUTURE) ×3 IMPLANT
SUT SILK 4 0 TIES 17X18 (SUTURE) ×3 IMPLANT
SYR 20CC LL (SYRINGE) ×3 IMPLANT
SYR CONTROL 10ML LL (SYRINGE) IMPLANT
TOWEL OR 17X24 6PK STRL BLUE (TOWEL DISPOSABLE) ×3 IMPLANT
TOWEL OR 17X26 10 PK STRL BLUE (TOWEL DISPOSABLE) ×3 IMPLANT
TUBE CONNECTING 12'X1/4 (SUCTIONS) ×1
TUBE CONNECTING 12X1/4 (SUCTIONS) ×2 IMPLANT

## 2015-09-02 NOTE — Anesthesia Procedure Notes (Signed)
Procedure Name: Intubation Date/Time: 09/02/2015 12:40 PM Performed by: Trixie Deis A Pre-anesthesia Checklist: Patient identified, Timeout performed, Emergency Drugs available, Suction available and Patient being monitored Patient Re-evaluated:Patient Re-evaluated prior to inductionOxygen Delivery Method: Circle system utilized Preoxygenation: Pre-oxygenation with 100% oxygen Intubation Type: Combination inhalational/ intravenous induction Ventilation: Mask ventilation without difficulty and Oral airway inserted - appropriate to patient size Laryngoscope Size: Glidescope and 3 Grade View: Grade III Tube type: Oral Tube size: 6.5 mm Number of attempts: 1 Airway Equipment and Method: Bougie stylet Placement Confirmation: ETT inserted through vocal cords under direct vision,  breath sounds checked- equal and bilateral and positive ETCO2 Secured at: 21 cm Tube secured with: Tape Dental Injury: Teeth and Oropharynx as per pre-operative assessment  Difficulty Due To: Difficulty was anticipated Comments: Intubation performed by Dr. Jillyn Hidden with surgeon present at bedside. Glottic opening and pathway to cords visualized using glidescope and able to pass bougie without issue.

## 2015-09-02 NOTE — Progress Notes (Signed)
Patient is a high fall risk and advised to call staff to assist her to get out of bed; however, patient got up several times to use the bedside commode without calling staff. Bed alarm went off several times, staff came to patient room and found her already on the commode. This RN explained to patient again and again that we do not want her to fall getting up by herself, and it is necessary to call staff before get up. Patient nodded and verbalized understanding. Patient was sitting by the side of bed, bed alarm on, call bell within reach, no other needs expressed at the moment.

## 2015-09-02 NOTE — Anesthesia Preprocedure Evaluation (Addendum)
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 on medications  Rhythm:Regular Rate:Normal  08/31/15 ECHO: Normal LV systolic function; grade 1 diastolic dysfunction; calcified aortic valve with very mild AS (mean gradient 12 mmHg   Neuro/Psych    GI/Hepatic Neg liver ROS,   Endo/Other  diabetes, Oral Hypoglycemic AgentsMorbid obesityThyroid mass  Renal/GU negative Renal ROS     Musculoskeletal   Abdominal   Peds  Hematology  (+) Blood dyscrasia (Hb 6.2), ,   Anesthesia Other Findings Case was canceled yesterday for Hgb of 6.2  Reproductive/Obstetrics                           Anesthesia Physical  Anesthesia Plan  ASA: IV  Anesthesia Plan: General   Post-op Pain Management:    Induction: Intravenous  Airway Management Planned: Oral ETT and Video Laryngoscope Planned  Additional Equipment:   Intra-op Plan:   Post-operative Plan: Possible Post-op intubation/ventilation  Informed Consent: I have reviewed the patients History and Physical, chart, labs and discussed the procedure including the risks, benefits and alternatives for the proposed anesthesia with the patient or authorized representative who has indicated his/her understanding and acceptance.   Dental advisory given  Plan Discussed with: Anesthesiologist, CRNA and Surgeon  Anesthesia Plan Comments: (Will have ENT present in room, maintain  spontaneous ventilation, will use glidescope)       Anesthesia Quick Evaluation

## 2015-09-02 NOTE — Progress Notes (Signed)
PATIENT DETAILS Name: Ashley Ramsey Age: 60 y.o. Sex: female Date of Birth: 11/27/1955 Admit Date: 08/29/2015 Admitting Physician [name], MD [id number], [name], MD  Subjective: No further hematochezia since yesterday afternnon  Assessment/Plan: Active Problems: Probable head and neck cancer: ENT input appreciated, plan for inpatient biopsy hopefully today  Left renal mass: Highly suspicious for renal cell carcinoma. Doubt it is related to above. Spoke with Dr. Ree Kida MD on 1/17,he reviewed patient's CT scan in chart - recommends outpatient follow-up with him in the office for consideration of nephrectomy in the future.  No history of hematuria. Renal function stable within normal limits.   E coli UTI with SIRS: Sensitivities noted, continue Rocephin-as NPO. Afebrile but WBC fluctuating.  Lower GI bleeding: Seems to have slowed down-last hematochezia yesterday afternoon. Suspect Diverticular bleed-Ongoing for the past 1 week intermittently-has had a few episodes of hematochezia in the hospital. CT scan abdomen shows diverticulosis. GI recommends holding off on colonoscopy at this time due to risks of the procedure and the need to address more pressing issues- if bleeding progresses this may need to be readdressed. Transfused a total of 3 units so far, Hb stable today  Acute blood loss anemia: Likely secondary to hematochezia, probably has chronic anemia from chronic disease. Transfused a total of 3 units today, Hgb stable at 8.3.Follow CBC and Transfuse as needed.  Acute renal failure: Likely prerenal azotemia in a setting of UTI/GI bleeding-along with lisinopril/HCTZ use.  Resolved with hydration. Continue to monitor BMP daily given acute blood loss.  Hypernatremia:change to 0.45 NS-follow  Hypokalemia: Repleted  Type 2 diabetes: Controlled with CBGs 110-130s. Continue SSI, hold metformin. Follow CBGs  Hypertension: Controlled. Continue to hold  lisinopril/HCTZ.   Tobacco abuse: Counseled  Alcohol abuse: Counseled, Ativan per CIWA protocol  Left adrenal gland nodule: Will need outpatient follow-up, especially given left adrenal mass.  Subpleural nodules in the right middle lobe of right lung and left lower lobe of left lung: Will need to repeat CT chest in 6 months. Stable on room air, no shortness of breath  Possible CAD: Atherosclerosis seen CT chest. Unfortunately with ongoing lower GI bleeding-unable to use antiplatelets. Echo with LFEV 0000000, grade 1 diastolic dysfunction, no wall motion abnormalities. Given numerous above medical problems-not a candidate for any procedures/further workup at this point.  Morbid obesity: Has lost significant amount of weight over the past few months.  Disposition: Remains inpatient  Antimicrobial agents  See below  Anti-infectives    Start     Dose/Rate Route Frequency Ordered Stop   08/29/15 2330  cefTRIAXone (ROCEPHIN) 1 g in dextrose 5 % 50 mL IVPB     1 g 100 mL/hr over 30 Minutes Intravenous Every 24 hours 08/29/15 2250        DVT Prophylaxis: SCD's, no pharmacological DVT ppx given GI bleed/anemia  Code Status: Full code   Family Communication Friend at bedside.  Procedures: ECHO- TTE Study Conclusions - Left ventricle: The cavity size was normal. Wall thickness was normal. Systolic function was normal. The estimated ejectionfraction was in the range of 55% to 60%. Wall motion was normal;there were no regional wall motion abnormalities. Dopplerparameters are consistent with abnormal left ventricularrelaxation (grade 1 diastolic dysfunction). Doppler parametersare consistent with high ventricular filling pressure. - Aortic valve: There was very mild stenosis. There was trivialregurgitation. - Mitral valve: Calcified annulus. Mildly thickened leaflets . - Left atrium: The atrium  was mildly dilated.  CONSULTS:  GI, urology and ent  Time spent 30 minutes-Greater  than 50% of this time was spent in counseling, explanation of diagnosis, planning of further management, and coordination of care.  MEDICATIONS: Scheduled Meds: . sodium chloride   Intravenous Once  . acetaminophen  650 mg Oral Once  . cefTRIAXone (ROCEPHIN)  IV  1 g Intravenous Q24H  . folic acid  1 mg Oral Daily  . Influenza vac split quadrivalent PF  0.5 mL Intramuscular Tomorrow-1000  . insulin aspart  0-9 Units Subcutaneous TID WC  . multivitamin with minerals  1 tablet Oral Daily  . pantoprazole (PROTONIX) IV  40 mg Intravenous Q12H  . thiamine  100 mg Oral Daily   Or  . thiamine  100 mg Intravenous Daily   Continuous Infusions: . sodium chloride 75 mL/hr at 09/02/15 0606   PRN Meds:.acetaminophen **OR** acetaminophen, HYDROcodone-acetaminophen, lidocaine, ondansetron **OR** ondansetron (ZOFRAN) IV, oxymetazoline    PHYSICAL EXAM: Vital signs in last 24 hours: Filed Vitals:   09/01/15 1525 09/01/15 1841 09/01/15 2118 09/02/15 0633  BP: 123/108 140/72 143/57 106/68  Pulse: 108 107 87 87  Temp: 98.3 F (36.8 C) 98.3 F (36.8 C) 98.4 F (36.9 C) 98.3 F (36.8 C)  TempSrc: Oral Oral Oral Oral  Resp: 18 18 18 18   Height:      Weight:      SpO2: 95% 98% 100% 99%    Weight change:  Filed Weights   08/29/15 1422  Weight: 113.399 kg (250 lb)   Body mass index is 44.3 kg/(m^2).   Gen Exam: Awake and alert with clear speech. Obese. Voice is very hoarse and weak. Neck: Supple-exam limited by body habitus. No stridor Chest:  CVS: S1 S2 regular. Abdomen: soft, non tender Extremities:  Neurologic: Non Focal Psych: cooperative Wounds: N/A.   Intake/Output from previous day:  Intake/Output Summary (Last 24 hours) at 09/02/15 0908 Last data filed at 09/02/15 0649  Gross per 24 hour  Intake 1977.5 ml  Output    850 ml  Net 1127.5 ml     LAB RESULTS: CBC  Recent Labs Lab 08/31/15 2057 09/01/15 0511 09/01/15 0735 09/01/15 2000 09/02/15 0547  WBC 17.2*  16.8* 19.1* 18.9* 17.0*  HGB 7.3* 6.2* 6.7* 8.0* 8.3*  HCT 22.9* 19.6* 20.8* 24.7* 25.8*  PLT 199 192 197 203 191  MCV 82.4 84.1 84.2 83.7 83.2  MCH 26.3 26.6 27.1 27.1 26.8  MCHC 31.9 31.6 32.2 32.4 32.2  RDW 13.9 14.4 14.3 14.1 14.5    Chemistries   Recent Labs Lab 08/29/15 1446 08/30/15 0142 08/31/15 1028 09/01/15 0511 09/02/15 0547  NA 138 137 144 148* 146*  K 3.1* 2.9* 3.9 3.8 3.6  CL 96* 96* 113* 116* 112*  CO2 27 31 24 27 24   GLUCOSE 166* 165* 174* 127* 101*  BUN 53* 56* 36* 28* 22*  CREATININE 1.47* 1.46* 0.88 0.84 0.82  CALCIUM 9.1 8.3* 8.6* 8.2* 8.4*  MG  --  1.5* 1.8  --   --     CBG:  Recent Labs Lab 09/01/15 0755 09/01/15 1156 09/01/15 1633 09/01/15 2236 09/02/15 0747  GLUCAP 128* 149* 158* 85 89    GFR Estimated Creatinine Clearance: 89.6 mL/min (by C-G formula based on Cr of 0.82).  Coagulation profile No results for input(s): INR, PROTIME in the last 168 hours.  Cardiac Enzymes No results for input(s): CKMB, TROPONINI, MYOGLOBIN in the last 168 hours.  Invalid input(s): CK  Invalid input(s):  POCBNP No results for input(s): DDIMER in the last 72 hours. No results for input(s): HGBA1C in the last 72 hours. No results for input(s): CHOL, HDL, LDLCALC, TRIG, CHOLHDL, LDLDIRECT in the last 72 hours. No results for input(s): TSH, T4TOTAL, T3FREE, THYROIDAB in the last 72 hours.  Invalid input(s): FREET3  Recent Labs  08/30/15 1630 08/30/15 2200  VITAMINB12  --  801  FOLATE  --  17.3  FERRITIN  --  333*  TIBC  --  207*  IRON  --  38  RETICCTPCT 1.5  --    No results for input(s): LIPASE, AMYLASE in the last 72 hours.  Urine Studies No results for input(s): UHGB, CRYS in the last 72 hours.  Invalid input(s): UACOL, UAPR, USPG, UPH, UTP, UGL, UKET, UBIL, UNIT, UROB, ULEU, UEPI, UWBC, URBC, UBAC, CAST, UCOM, BILUA  MICROBIOLOGY: Recent Results (from the past 240 hour(s))  Culture, Urine     Status: None   Collection Time:  08/29/15  9:09 PM  Result Value Ref Range Status   Specimen Description URINE, CATHETERIZED  Final   Special Requests NONE  Final   Culture >=100,000 COLONIES/mL ESCHERICHIA COLI  Final   Report Status 09/01/2015 FINAL  Final   Organism ID, Bacteria ESCHERICHIA COLI  Final      Susceptibility   Escherichia coli - MIC*    AMPICILLIN >=32 RESISTANT Resistant     CEFAZOLIN <=4 SENSITIVE Sensitive     CEFTRIAXONE <=1 SENSITIVE Sensitive     CIPROFLOXACIN <=0.25 SENSITIVE Sensitive     GENTAMICIN <=1 SENSITIVE Sensitive     IMIPENEM <=0.25 SENSITIVE Sensitive     NITROFURANTOIN <=16 SENSITIVE Sensitive     TRIMETH/SULFA <=20 SENSITIVE Sensitive     AMPICILLIN/SULBACTAM 4 SENSITIVE Sensitive     PIP/TAZO <=4 SENSITIVE Sensitive     * >=100,000 COLONIES/mL ESCHERICHIA COLI  Surgical pcr screen     Status: None   Collection Time: 09/01/15  4:09 AM  Result Value Ref Range Status   MRSA, PCR NEGATIVE NEGATIVE Final   Staphylococcus aureus NEGATIVE NEGATIVE Final    Comment:        The Xpert SA Assay (FDA approved for NASAL specimens in patients over 23 years of age), is one component of a comprehensive surveillance program.  Test performance has been validated by [facility] for patients greater than or equal to 50 year old. It is not intended to diagnose infection nor to guide or monitor treatment.     RADIOLOGY STUDIES/RESULTS: Dg Chest 2 View  08/29/2015  CLINICAL DATA:  One month history of chest pain and hoarse voice. EXAM: CHEST  2 VIEW COMPARISON:  None. FINDINGS: The heart is borderline and enlarged. Mild tortuosity of the thoracic aorta. Low lung volumes with mild vascular crowding and streaky basilar atelectasis. There also bronchitic changes which could be acute or chronic. No infiltrates or effusions. The bony thorax is intact. IMPRESSION: Acute versus chronic bronchitic change.  No infiltrates or effusions Electronically Signed   By: Marijo Sanes M.D.   On: 08/29/2015  16:15   Ct Soft Tissue Neck W Contrast  08/29/2015  CLINICAL DATA:  60 year old female with abnormal full waist for 1 month. Abdominal pain. Three days of vomiting. Initial encounter. EXAM: CT NECK WITH CONTRAST TECHNIQUE: Multidetector CT imaging of the neck was performed using the standard protocol following the bolus administration of intravenous contrast. CONTRAST:  52mL OMNIPAQUE IOHEXOL 300 MG/ML SOLN in conjunction with contrast enhanced imaging of the chest, abdomen,  and pelvis reported separately. COMPARISON:  CT chest abdomen and pelvis from today reported separately FINDINGS: Pharynx and larynx: Bulky supraglottic laryngeal tumor with marked soft tissue enlargement of the bilateral aryepiglottic folds and anterior commissure up to 17 mm in thickness. Tumor appears inseparable from the undersurface of the strap muscles suggesting extension through the thyroid cartilage bilaterally. Posterior hypopharynx involvement. The epiglottis is thickened, nodular, and distorted. Furthermore, the vallecula is mostly effaced and soft tissue at the base of tongue appears nodular and thickened. All told, tumor encompasses 37 x 40 x 58 mm (AP by transverse by CC). The other laryngeal cartilages appear spared. The palatine tonsils, soft palate, and nasopharynx appear within normal limits. Superior parapharyngeal spaces and retropharyngeal space are within normal limits. Salivary glands: Sublingual space, submandibular glands, and parotid glands are within normal limits. Thyroid: Coarsely calcified 2.3 cm right thyroid nodule. Subcentimeter left lobe nodule. Lymph nodes: Abnormal right level 3 node measuring 9 mm at the level of the thyroid cartilage (series 1, image 57). Small but asymmetric 5 mm right level IIIa lymph node at the level of the hyoid bone on the right (series 1, image 49). Bilateral level 2A nodes appear symmetric measuring 8-9 mm in thickness. No level 4, 5, or level 1 lymphadenopathy. Vascular:  Suboptimal intravascular contrast timing, but major vascular structures in the neck and at the skullbase appear to remain patent. Left greater than right carotid bifurcation calcified atherosclerosis. Limited intracranial: Negative.  Partially empty sella. Visualized orbits: Negative. Mastoids and visualized paranasal sinuses: Visualized paranasal sinuses and mastoids are clear. Skeleton: Mostly absent dentition. Periapical lucency about the residual left mandible molar. No osseous metastatic disease identified. Upper chest: Reported separately today. IMPRESSION: 1. 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 2. Suspect metastatic nodal disease at level 3 on the right (small but asymmetric up to 9 mm nodes). Bilateral level 2 nodes are indeterminate and also measure up to 9 mm individually. No cystic or necrotic nodes in the neck. 3. See CT chest abdomen and pelvis from today reported separately. Electronically Signed   By: Genevie Ann M.D.   On: 08/29/2015 19:56   Ct Chest W Contrast  08/29/2015  CLINICAL DATA:  60 year old female with 2-3 day history of vomiting. Abdominal pain for the past month. Loss of voice. EXAM: CT CHEST, ABDOMEN, AND PELVIS WITH CONTRAST TECHNIQUE: Multidetector CT imaging of the chest, abdomen and pelvis was performed following the standard protocol during bolus administration of intravenous contrast. CONTRAST:  12mL OMNIPAQUE IOHEXOL 300 MG/ML  SOLN COMPARISON:  No priors. FINDINGS: CT CHEST FINDINGS Mediastinum/Lymph Nodes: Heart size is normal. There is no significant pericardial fluid, thickening or pericardial calcification. There is atherosclerosis of the thoracic aorta, the great vessels of the mediastinum and the coronary arteries, including calcified atherosclerotic plaque in the left main, left anterior descending, left circumflex and right coronary arteries. Calcifications of the aortic  valve and mitral valve/annulus. No pathologically enlarged mediastinal or hilar lymph nodes. Esophagus is unremarkable in appearance. No axillary lymphadenopathy. Lungs/Pleura: 4 mm subpleural nodule in the lateral segment of the right middle lobe (image 31 of series 4). 4 mm subpleural nodule in the posterior left lower lobe (image 38 of series 4). No other suspicious appearing pulmonary nodules or masses. No acute consolidative airspace disease. No pleural effusions. Musculoskeletal/Soft Tissues: There are no aggressive appearing lytic or blastic lesions noted in the visualized portions of the skeleton. CT  ABDOMEN AND PELVIS FINDINGS Hepatobiliary: No cystic or solid hepatic lesions. No intra or extrahepatic biliary ductal dilatation. 7 mm calcified gallstone lying dependently in the gallbladder. No current findings to suggest an acute cholecystitis at this time. Pancreas: No pancreatic mass. No pancreatic ductal dilatation. No pancreatic or peripancreatic fluid or inflammatory changes. Spleen: Unremarkable. Adrenals/Urinary Tract: In the lower pole of the left kidney there is a 5.9 x 6.1 x 6.3 cm heterogeneously enhancing lesion highly concerning for renal cell carcinoma. The inferior aspect of this lesion comes in very close proximity to the anterior aspect of the left psoas muscle and quadratus lumborum muscle, however, there appears to be an intervening fat plane at this time. The lesion is well separated from the left renal vein. Right kidney and right adrenal gland are normal in appearance. 1.2 x 1.4 cm indeterminate nodule in the lateral limb of the left adrenal gland. No hydroureteronephrosis. Urinary bladder is largely decompressed, but otherwise unremarkable in appearance. Stomach/Bowel: Normal appearance of the stomach. No pathologic dilatation of small bowel or colon. A few scattered colonic diverticulae are noted, without surrounding inflammatory changes to suggest an acute diverticulitis at this  time. Appendix is normal. Vascular/Lymphatic: Atherosclerosis throughout the abdominal and pelvic vasculature, without evidence of aneurysm or dissection. Left renal vein is widely patent and separate from the lower pole mass. No lymphadenopathy noted in the abdomen or pelvis. Reproductive: Uterus and ovaries are unremarkable in appearance. Other: No significant volume of ascites.  No pneumoperitoneum. Musculoskeletal: There are no aggressive appearing lytic or blastic lesions noted in the visualized portions of the skeleton. IMPRESSION: 1. No acute findings in the abdomen or pelvis to account for the patient's symptoms. 2. However, there is a 5.9 x 6.1 x 6.3 cm heterogeneously enhancing mass in the lower pole of the left kidney highly concerning for renal cell carcinoma. At this time, the lesion appears likely to be encapsulated within Gerota's fascia (although it comes very close to the left psoas and quadratus lumborum musculature), does not involve the left renal vein, and does not appear to be associated with lymphadenopathy. Nonemergent Urologic consultation for surgical resection is strongly recommended in the near future. 3. 1.2 x 1.4 cm indeterminate nodule in the left adrenal gland. Attention on followup studies is recommended, as a metastatic lesion is not excluded. 4. 4 mm subpleural nodules in the right middle lobe and left lower lobe. These are highly nonspecific, and favored to represent subpleural lymph nodes, but attention on followup studies is recommended to ensure stability. 5. Cholelithiasis without evidence of acute cholecystitis at this time. 6. Atherosclerosis, including left main and 3 vessel coronary artery disease. Please note that although the presence of coronary artery calcium documents the presence of coronary artery disease, the severity of this disease and any potential stenosis cannot be assessed on this non-gated CT examination. Assessment for potential risk factor modification,  dietary therapy or pharmacologic therapy may be warranted, if clinically indicated. 7. There are calcifications of the aortic valve and mitral valve/annulus. Echocardiographic correlation for evaluation of potential valvular dysfunction may be warranted if clinically indicated. 8. Colonic diverticulosis without evidence of acute diverticulitis at this time. 9. Additional incidental findings, as above. These results were called by telephone at the time of interpretation on 08/29/2015 at 7:34 pm to Dr. Davonna Belling, who verbally acknowledged these results. Electronically Signed   By: Vinnie Langton M.D.   On: 08/29/2015 19:37   Ct Abdomen Pelvis W Contrast  08/29/2015  CLINICAL DATA:  60 year old female with 2-3 day history of vomiting. Abdominal pain for the past month. Loss of voice. EXAM: CT CHEST, ABDOMEN, AND PELVIS WITH CONTRAST TECHNIQUE: Multidetector CT imaging of the chest, abdomen and pelvis was performed following the standard protocol during bolus administration of intravenous contrast. CONTRAST:  76mL OMNIPAQUE IOHEXOL 300 MG/ML  SOLN COMPARISON:  No priors. FINDINGS: CT CHEST FINDINGS Mediastinum/Lymph Nodes: Heart size is normal. There is no significant pericardial fluid, thickening or pericardial calcification. There is atherosclerosis of the thoracic aorta, the great vessels of the mediastinum and the coronary arteries, including calcified atherosclerotic plaque in the left main, left anterior descending, left circumflex and right coronary arteries. Calcifications of the aortic valve and mitral valve/annulus. No pathologically enlarged mediastinal or hilar lymph nodes. Esophagus is unremarkable in appearance. No axillary lymphadenopathy. Lungs/Pleura: 4 mm subpleural nodule in the lateral segment of the right middle lobe (image 31 of series 4). 4 mm subpleural nodule in the posterior left lower lobe (image 38 of series 4). No other suspicious appearing pulmonary nodules or masses. No acute  consolidative airspace disease. No pleural effusions. Musculoskeletal/Soft Tissues: There are no aggressive appearing lytic or blastic lesions noted in the visualized portions of the skeleton. CT ABDOMEN AND PELVIS FINDINGS Hepatobiliary: No cystic or solid hepatic lesions. No intra or extrahepatic biliary ductal dilatation. 7 mm calcified gallstone lying dependently in the gallbladder. No current findings to suggest an acute cholecystitis at this time. Pancreas: No pancreatic mass. No pancreatic ductal dilatation. No pancreatic or peripancreatic fluid or inflammatory changes. Spleen: Unremarkable. Adrenals/Urinary Tract: In the lower pole of the left kidney there is a 5.9 x 6.1 x 6.3 cm heterogeneously enhancing lesion highly concerning for renal cell carcinoma. The inferior aspect of this lesion comes in very close proximity to the anterior aspect of the left psoas muscle and quadratus lumborum muscle, however, there appears to be an intervening fat plane at this time. The lesion is well separated from the left renal vein. Right kidney and right adrenal gland are normal in appearance. 1.2 x 1.4 cm indeterminate nodule in the lateral limb of the left adrenal gland. No hydroureteronephrosis. Urinary bladder is largely decompressed, but otherwise unremarkable in appearance. Stomach/Bowel: Normal appearance of the stomach. No pathologic dilatation of small bowel or colon. A few scattered colonic diverticulae are noted, without surrounding inflammatory changes to suggest an acute diverticulitis at this time. Appendix is normal. Vascular/Lymphatic: Atherosclerosis throughout the abdominal and pelvic vasculature, without evidence of aneurysm or dissection. Left renal vein is widely patent and separate from the lower pole mass. No lymphadenopathy noted in the abdomen or pelvis. Reproductive: Uterus and ovaries are unremarkable in appearance. Other: No significant volume of ascites.  No pneumoperitoneum. Musculoskeletal:  There are no aggressive appearing lytic or blastic lesions noted in the visualized portions of the skeleton. IMPRESSION: 1. No acute findings in the abdomen or pelvis to account for the patient's symptoms. 2. However, there is a 5.9 x 6.1 x 6.3 cm heterogeneously enhancing mass in the lower pole of the left kidney highly concerning for renal cell carcinoma. At this time, the lesion appears likely to be encapsulated within Gerota's fascia (although it comes very close to the left psoas and quadratus lumborum musculature), does not involve the left renal vein, and does not appear to be associated with lymphadenopathy. Nonemergent Urologic consultation for surgical resection is strongly recommended in the near future. 3. 1.2 x 1.4 cm indeterminate nodule in the left adrenal gland. Attention on followup studies is recommended,  as a metastatic lesion is not excluded. 4. 4 mm subpleural nodules in the right middle lobe and left lower lobe. These are highly nonspecific, and favored to represent subpleural lymph nodes, but attention on followup studies is recommended to ensure stability. 5. Cholelithiasis without evidence of acute cholecystitis at this time. 6. Atherosclerosis, including left main and 3 vessel coronary artery disease. Please note that although the presence of coronary artery calcium documents the presence of coronary artery disease, the severity of this disease and any potential stenosis cannot be assessed on this non-gated CT examination. Assessment for potential risk factor modification, dietary therapy or pharmacologic therapy may be warranted, if clinically indicated. 7. There are calcifications of the aortic valve and mitral valve/annulus. Echocardiographic correlation for evaluation of potential valvular dysfunction may be warranted if clinically indicated. 8. Colonic diverticulosis without evidence of acute diverticulitis at this time. 9. Additional incidental findings, as above. These results were  called by telephone at the time of interpretation on 08/29/2015 at 7:34 pm to Dr. Davonna Belling, who verbally acknowledged these results. Electronically Signed   By: Vinnie Langton M.D.   On: 08/29/2015 19:37    [facility] Pager:[phone]  If 7PM-7AM, please contact night-coverage [email or web address] Password [email or web address] 09/02/2015, 9:08 AM   LOS: 3 days

## 2015-09-02 NOTE — Interval H&P Note (Signed)
History and Physical Interval Note:  09/02/2015 11:13 AM  Ashley Ramsey [name]  has presented today for surgery, with the diagnosis of LARYNGEAL CANCER  The various methods of treatment have been discussed with the patient and family. After consideration of risks, benefits and other options for treatment, the patient has consented to  Procedure(s): DIRECT LARYNGOSCOPY WITH BIOPSY (N/A) ESOPHAGOSCOPY (N/A) POSSIBLE TRACHEOSTOMY (N/A) as a surgical intervention .  The patient's history has been reviewed, patient examined, no change in status, stable for surgery.  I have reviewed the patient's chart and labs.  Questions were answered to the patient's satisfaction.     Chelsee Hosie

## 2015-09-02 NOTE — Progress Notes (Signed)
Report called to OR nurse, Floor RN informed OR RN that consent has not been sign due to patient stating that MD has done fully gone over surgical procedure with her. CHG bath given by CNA Tech. CBG obtain prior to transport arrival to take pt to PACU. Ashley Ramsey 09/02/2015.0945

## 2015-09-02 NOTE — Interval H&P Note (Signed)
History and Physical Interval Note:  09/02/2015 11:14 AM  Ashley Ramsey Ashley Ramsey  has presented today for surgery, with the diagnosis of LARYNGEAL CANCER  The various methods of treatment have been discussed with the patient and family. After consideration of risks, benefits and other options for treatment, the patient has consented to  Procedure(s): DIRECT LARYNGOSCOPY WITH BIOPSY (N/A) ESOPHAGOSCOPY (N/A) POSSIBLE TRACHEOSTOMY (N/A) as a surgical intervention .  The patient's history has been reviewed, patient examined, no change in status, stable for surgery.  I have reviewed the patient's chart and labs.  Questions were answered to the patient's satisfaction.     [name]

## 2015-09-02 NOTE — Transfer of Care (Signed)
Immediate Anesthesia Transfer of Care Note  Patient: Ashley Ramsey  Procedure(s) Performed: Procedure(s): DIRECT LARYNGOSCOPY WITH BIOPSY (N/A) ESOPHAGOSCOPY (N/A)  Patient Location: PACU  Anesthesia Type:General  Level of Consciousness: awake, alert  and oriented  Airway & Oxygen Therapy: Patient Spontanous Breathing and Patient connected to face mask oxygen  Post-op Assessment: Report given to RN, Post -op Vital signs reviewed and stable and Patient moving all extremities  Post vital signs: Reviewed and stable  Last Vitals:  Filed Vitals:   09/01/15 2118 09/02/15 0633  BP: 143/57 106/68  Pulse: 87 87  Temp: 36.9 C 36.8 C  Resp: 18 18    Complications: No apparent anesthesia complications

## 2015-09-02 NOTE — H&P (View-Only) (Signed)
pt reported to admission nurse that  she drinks 1/5th of liquor (15oz) per week. Pt denies of drinking to primary nurse. Current CIWA score is 1. On-call MD C.  Danford made aware . Pt is placed on CIWA protocol. MD also made aware of recent  BP(93/51)  and orthostatic vitals. Pt remains asymptomatic. Will monitor.

## 2015-09-02 NOTE — Op Note (Signed)
OPERATIVE REPORT  DATE OF SURGERY: 09/02/2015  PATIENT:  Ashley Ramsey,  60 y.o. female  PRE-OPERATIVE DIAGNOSIS:  LARYNGEAL CANCER  POST-OPERATIVE DIAGNOSIS:  LARYNGEAL CANCER  PROCEDURE:  Procedure(s): DIRECT LARYNGOSCOPY WITH BIOPSY ESOPHAGOSCOPY POSSIBLE TRACHEOSTOMY  SURGEON:  Beckie Salts, MD  ASSISTANTS: none  ANESTHESIA:   General   EBL:  10 ml  DRAINS: none  LOCAL MEDICATIONS USED:  None  SPECIMEN:  Laryngeal mass  COUNTS:  Correct  PROCEDURE DETAILS: The patient was taken to the operating room and placed on the operating table in the supine position. Following induction of general endotracheal anesthesia, the table was turned 90 and the patient was draped in a standard fashion.  Esophagoscopy. The rigid cervical esophagascope was used to evaluate the esophagus, It passed easily into the esophagus and there were no mucosal lesions noted.  Direct laryngoscopy with biopsy. The Jako laryngoscope was used to visualize the larynx and hypopharynx. There was a large, diffuse papillary appearing mas involving the Right false vocal cord, ary-epiglottic fold, pyriform sinus and lateral pharyngeal wall, up onto the epiglottis, and vallecula centered on the right side.The true cords were swollen and probably involved in the submucosal plain. The left side of the supraglottic larynx was also involved but not as extensive as the right side. The subglottis and the left pyriform were not involved. The post-cricoid region was also not involved. Multiple large biopsies were taken and topical adrenaline was applied on pledgets for hemostasis. The scope was removed. She was awakened, extubated and transferred to PACU stable.    PATIENT DISPOSITION:  To PACU, stable

## 2015-09-02 NOTE — Progress Notes (Signed)
Bedside report received from [facility]. Pt resting in bed. Bed low and locked. Bed alarm on. Call bell within reach. Will continue to monitor pt. Dorita Fray 09/02/2015 2:18 PM

## 2015-09-02 NOTE — Progress Notes (Signed)
[name] CONSULT NOTE  Patient Care Team: Lorayne Marek, MD as PCP - General (Internal Medicine)  CHIEF COMPLAINTS/PURPOSE OF CONSULTATION:  Supraglottic mass and left kidney mass  HISTORY OF PRESENTING ILLNESS: She is a poor historian. I review her chart extensively and collaborated history with the patient Ashley Ramsey 60 y.o. female was admitted to the hospital after presentation with weight loss, hoarseness and dysphagia. The patient have a strong history of smoking and drinking. She denies choking sensation.  she denies pain in her throat.  At presentation, she was noted to have significant anemia. This is significantly different than her baseline hemoglobin which was normal in 2015.  she was transfused with blood. She had GI consult who felt that due to high risk, colonoscopy is deferred  According to her chart, she has been complaining of abdominal pain. When I question her today, she denies abdominal pain. She cannot tell me for sure how much weight she has lost but she thinks she has lost somewhere around 40-50 pounds. On 08/29/2015, she has CT scan of the neck, chest and abdomen. The results show bulky supraglottic tumor with bilateral lymphadenopathy. CT scan of the abdomen revealed left kidney mass, suspicious for undiagnosed renal cell carcinoma  On 09/02/2015, she underwent direct laryngoscopy with biopsy. There is a large mass affecting the false vocal cord, with extension to multiple regions including the lateral pharyngeal wall, epiglottis and vallecula. The true vocal cords appeared to be swollen. Biopsies are pending She denies any nausea. She denies pain right now.   she was seen by urologist with plan for future nephrectomy and urology follow-up   MEDICAL HISTORY:  Past Medical History  Diagnosis Date  . Thyroid disease   . Hypertension   . Neck mass hospitalized 08/29/2015  . Type II diabetes mellitus ([place])     SURGICAL HISTORY: Past Surgical  History  Procedure Laterality Date  . No past surgeries      SOCIAL HISTORY: Social History   Social History  . Marital Status: Single    Spouse Name: N/A  . Number of Children: N/A  . Years of Education: N/A   Occupational History  . Not on file.   Social History Main Topics  . Smoking status: Former Smoker -- 0.50 packs/day for 34 years    Types: Cigarettes    Quit date: 07/14/2015  . Smokeless tobacco: Never Used  . Alcohol Use: 15.0 oz/week    25 Shots of liquor per week     Comment: 08/29/2015 "I drink 1/5th liquor one 1 day/week"  . Drug Use: No  . Sexual Activity: Not Currently     Comment: occassional    Other Topics Concern  . Not on file   Social History Narrative    FAMILY HISTORY: Family History  Problem Relation Age of Onset  . Diabetes Mother   . Hypertension Mother   . Heart disease Mother   . Diabetes Sister   . Hypertension Sister   . Cancer Daughter     Stomach    ALLERGIES:  has No Known Allergies.  MEDICATIONS:  Current Facility-Administered Medications  Medication Dose Route Frequency Provider Last Rate Last Dose  . 0.45 % sodium chloride infusion   Intravenous Continuous Jonetta Osgood, MD 75 mL/hr at 09/02/15 0606    . 0.9 %  sodium chloride infusion   Intravenous Once Jonetta Osgood, MD      . acetaminophen (TYLENOL) tablet 650 mg  650 mg Oral  Q6H PRN [name], MD       Or  . acetaminophen (TYLENOL) suppository 650 mg  650 mg Rectal Q6H PRN [name], MD      . acetaminophen (TYLENOL) tablet 650 mg  650 mg Oral Once Jonetta Osgood, MD   650 mg at 09/01/15 0811  . cefTRIAXone (ROCEPHIN) 1 g in dextrose 5 % 50 mL IVPB  1 g Intravenous Q24H Rolla Flatten, RPH   1 g at 09/01/15 2312  . folic acid (FOLVITE) tablet 1 mg  1 mg Oral Daily [name], MD   1 mg at 08/31/15 1000  . HYDROcodone-acetaminophen (NORCO/VICODIN) 5-325 MG per tablet 1-2 tablet  1-2 tablet Oral Q4H PRN [name], MD   2 tablet at 08/31/15 2101  . Influenza vac split quadrivalent PF (FLUARIX) injection 0.5 mL  0.5 mL Intramuscular Tomorrow-1000 [name], MD   0.5 mL at 08/31/15 1000  . insulin aspart (novoLOG) injection 0-9 Units  0-9 Units Subcutaneous TID WC Jonetta Osgood, MD   2 Units at 09/01/15 1744  . lactated ringers infusion   Intravenous Continuous Lauretta Grill, MD 10 mL/hr at 09/02/15 1128    . lidocaine (XYLOCAINE) 4 % external solution 0-50 mL  0-50 mL Topical Once PRN Izora Gala, MD      . morphine 2 MG/ML injection 1 mg  1 mg Intravenous Q4H PRN Jonetta Osgood, MD   1 mg at 09/02/15 1621  . multivitamin with minerals tablet 1 tablet  1 tablet Oral Daily [name], MD   1 tablet at 08/31/15 1000  . ondansetron (ZOFRAN) tablet 4 mg  4 mg Oral Q6H PRN [name], MD       Or  . ondansetron (ZOFRAN) injection 4 mg  4 mg Intravenous Q6H PRN [name], MD      . oxymetazoline (AFRIN) 0.05 % nasal spray 1 spray  1 spray Each Nare Once PRN Izora Gala, MD      . pantoprazole (PROTONIX) injection 40 mg  40 mg Intravenous Q12H Jonetta Osgood, MD   40 mg at 09/02/15 0914  . thiamine (VITAMIN B-1) tablet 100 mg  100 mg Oral Daily [name], MD       Or  . thiamine (B-1) injection 100 mg  100 mg Intravenous Daily [name], MD   100 mg at 09/02/15 [id number]    REVIEW OF SYSTEMS:   Constitutional: Denies fevers, chills or abnormal night sweats Eyes: Denies blurriness of vision, double vision or watery eyes Respiratory: Denies cough, dyspnea or wheezes Cardiovascular: Denies palpitation, chest discomfort or lower extremity swelling Gastrointestinal:  Denies nausea, heartburn or change in bowel habits Skin: Denies abnormal skin rashes Lymphatics: Denies new lymphadenopathy or easy bruising Neurological:Denies numbness, tingling or new weaknesses Behavioral/Psych: Mood is stable, no new changes  All other systems were  reviewed with the patient and are negative.  PHYSICAL EXAMINATION: ECOG PERFORMANCE STATUS: 2 - Symptomatic, <50% confined to bed  Filed Vitals:   09/02/15 1355 09/02/15 1427  BP: 142/73 166/76  Pulse: 101 102  Temp: 96.8 F (36 C) 98.2 F (36.8 C)  Resp: 17 18   Filed Weights   08/29/15 1422  Weight: 250 lb (113.399 kg)    GENERAL:alert, no distress and comfortable. She is morbidly obese  SKIN: skin color, texture, turgor are normal, no rashes or significant lesions EYES: normal, conjunctiva are pink and non-injected, sclera  clear OROPHARYNX:no exudate, no erythema and lips, buccal mucosa, and tongue normal . Poor dentition is noted  NECK: supple, thyroid normal size, non-tender, without nodularity LYMPH:  no palpable lymphadenopathy in the cervical, axillary or inguinal LUNGS: clear to auscultation and percussion with normal breathing effort HEART: regular rate & rhythm and no murmurs and no lower extremity edema ABDOMEN:abdomen soft, non-tender and normal bowel sounds Musculoskeletal:no cyanosis of digits and no clubbing  PSYCH: alert & oriented x 3 with fluent speech NEURO: no focal motor/sensory deficits  LABORATORY DATA:  I have reviewed the data as listed Lab Results  Component Value Date   WBC 17.0* 09/02/2015   HGB 8.3* 09/02/2015   HCT 25.8* 09/02/2015   MCV 83.2 09/02/2015   PLT 191 09/02/2015    Recent Labs  08/29/15 1446  08/31/15 1028 09/01/15 0511 09/02/15 0547  NA 138  < > 144 148* 146*  K 3.1*  < > 3.9 3.8 3.6  CL 96*  < > 113* 116* 112*  CO2 27  < > 24 27 24   GLUCOSE 166*  < > 174* 127* 101*  BUN 53*  < > 36* 28* 22*  CREATININE 1.47*  < > 0.88 0.84 0.82  CALCIUM 9.1  < > 8.6* 8.2* 8.4*  GFRNONAA 38*  < > >60 >60 >60  GFRAA 44*  < > >60 >60 >60  PROT 7.3  --  6.0*  --   --   ALBUMIN 2.9*  --  2.5*  --   --   AST 13*  --  13*  --   --   ALT 13*  --  12*  --   --   ALKPHOS 88  --  70  --   --   BILITOT 0.4  --  0.6  --   --   < > =  values in this interval not displayed.  RADIOGRAPHIC STUDIES: I have personally reviewed the radiological images as listed and agreed with the findings in the report. Dg Chest 2 View  08/29/2015  CLINICAL DATA:  One month history of chest pain and hoarse voice. EXAM: CHEST  2 VIEW COMPARISON:  None. FINDINGS: The heart is borderline and enlarged. Mild tortuosity of the thoracic aorta. Low lung volumes with mild vascular crowding and streaky basilar atelectasis. There also bronchitic changes which could be acute or chronic. No infiltrates or effusions. The bony thorax is intact. IMPRESSION: Acute versus chronic bronchitic change.  No infiltrates or effusions Electronically Signed   By: Marijo Sanes M.D.   On: 08/29/2015 16:15   Ct Soft Tissue Neck W Contrast  08/29/2015  CLINICAL DATA:  60 year old female with abnormal full waist for 1 month. Abdominal pain. Three days of vomiting. Initial encounter. EXAM: CT NECK WITH CONTRAST TECHNIQUE: Multidetector CT imaging of the neck was performed using the standard protocol following the bolus administration of intravenous contrast. CONTRAST:  90mL OMNIPAQUE IOHEXOL 300 MG/ML SOLN in conjunction with contrast enhanced imaging of the chest, abdomen, and pelvis reported separately. COMPARISON:  CT chest abdomen and pelvis from today reported separately FINDINGS: Pharynx and larynx: Bulky supraglottic laryngeal tumor with marked soft tissue enlargement of the bilateral aryepiglottic folds and anterior commissure up to 17 mm in thickness. Tumor appears inseparable from the undersurface of the strap muscles suggesting extension through the thyroid cartilage bilaterally. Posterior hypopharynx involvement. The epiglottis is thickened, nodular, and distorted. Furthermore, the vallecula is mostly effaced and soft tissue at the base of tongue appears nodular and  thickened. All told, tumor encompasses 37 x 40 x 58 mm (AP by transverse by CC). The other laryngeal cartilages  appear spared. The palatine tonsils, soft palate, and nasopharynx appear within normal limits. Superior parapharyngeal spaces and retropharyngeal space are within normal limits. Salivary glands: Sublingual space, submandibular glands, and parotid glands are within normal limits. Thyroid: Coarsely calcified 2.3 cm right thyroid nodule. Subcentimeter left lobe nodule. Lymph nodes: Abnormal right level 3 node measuring 9 mm at the level of the thyroid cartilage (series 1, image 57). Small but asymmetric 5 mm right level IIIa lymph node at the level of the hyoid bone on the right (series 1, image 49). Bilateral level 2A nodes appear symmetric measuring 8-9 mm in thickness. No level 4, 5, or level 1 lymphadenopathy. Vascular: Suboptimal intravascular contrast timing, but major vascular structures in the neck and at the skullbase appear to remain patent. Left greater than right carotid bifurcation calcified atherosclerosis. Limited intracranial: Negative.  Partially empty sella. Visualized orbits: Negative. Mastoids and visualized paranasal sinuses: Visualized paranasal sinuses and mastoids are clear. Skeleton: Mostly absent dentition. Periapical lucency about the residual left mandible molar. No osseous metastatic disease identified. Upper chest: Reported separately today. IMPRESSION: 1. 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 2. Suspect metastatic nodal disease at level 3 on the right (small but asymmetric up to 9 mm nodes). Bilateral level 2 nodes are indeterminate and also measure up to 9 mm individually. No cystic or necrotic nodes in the neck. 3. See CT chest abdomen and pelvis from today reported separately. Electronically Signed   By: Genevie Ann M.D.   On: 08/29/2015 19:56   Ct Chest W Contrast  08/29/2015  CLINICAL DATA:  60 year old female with 2-3 day history of vomiting. Abdominal pain for the past month. Loss of  voice. EXAM: CT CHEST, ABDOMEN, AND PELVIS WITH CONTRAST TECHNIQUE: Multidetector CT imaging of the chest, abdomen and pelvis was performed following the standard protocol during bolus administration of intravenous contrast. CONTRAST:  34mL OMNIPAQUE IOHEXOL 300 MG/ML  SOLN COMPARISON:  No priors. FINDINGS: CT CHEST FINDINGS Mediastinum/Lymph Nodes: Heart size is normal. There is no significant pericardial fluid, thickening or pericardial calcification. There is atherosclerosis of the thoracic aorta, the great vessels of the mediastinum and the coronary arteries, including calcified atherosclerotic plaque in the left main, left anterior descending, left circumflex and right coronary arteries. Calcifications of the aortic valve and mitral valve/annulus. No pathologically enlarged mediastinal or hilar lymph nodes. Esophagus is unremarkable in appearance. No axillary lymphadenopathy. Lungs/Pleura: 4 mm subpleural nodule in the lateral segment of the right middle lobe (image 31 of series 4). 4 mm subpleural nodule in the posterior left lower lobe (image 38 of series 4). No other suspicious appearing pulmonary nodules or masses. No acute consolidative airspace disease. No pleural effusions. Musculoskeletal/Soft Tissues: There are no aggressive appearing lytic or blastic lesions noted in the visualized portions of the skeleton. CT ABDOMEN AND PELVIS FINDINGS Hepatobiliary: No cystic or solid hepatic lesions. No intra or extrahepatic biliary ductal dilatation. 7 mm calcified gallstone lying dependently in the gallbladder. No current findings to suggest an acute cholecystitis at this time. Pancreas: No pancreatic mass. No pancreatic ductal dilatation. No pancreatic or peripancreatic fluid or inflammatory changes. Spleen: Unremarkable. Adrenals/Urinary Tract: In the lower pole of the left kidney there is a 5.9 x 6.1 x 6.3 cm heterogeneously enhancing lesion highly concerning for renal cell carcinoma. The inferior  aspect of  this lesion comes in very close proximity to the anterior aspect of the left psoas muscle and quadratus lumborum muscle, however, there appears to be an intervening fat plane at this time. The lesion is well separated from the left renal vein. Right kidney and right adrenal gland are normal in appearance. 1.2 x 1.4 cm indeterminate nodule in the lateral limb of the left adrenal gland. No hydroureteronephrosis. Urinary bladder is largely decompressed, but otherwise unremarkable in appearance. Stomach/Bowel: Normal appearance of the stomach. No pathologic dilatation of small bowel or colon. A few scattered colonic diverticulae are noted, without surrounding inflammatory changes to suggest an acute diverticulitis at this time. Appendix is normal. Vascular/Lymphatic: Atherosclerosis throughout the abdominal and pelvic vasculature, without evidence of aneurysm or dissection. Left renal vein is widely patent and separate from the lower pole mass. No lymphadenopathy noted in the abdomen or pelvis. Reproductive: Uterus and ovaries are unremarkable in appearance. Other: No significant volume of ascites.  No pneumoperitoneum. Musculoskeletal: There are no aggressive appearing lytic or blastic lesions noted in the visualized portions of the skeleton. IMPRESSION: 1. No acute findings in the abdomen or pelvis to account for the patient's symptoms. 2. However, there is a 5.9 x 6.1 x 6.3 cm heterogeneously enhancing mass in the lower pole of the left kidney highly concerning for renal cell carcinoma. At this time, the lesion appears likely to be encapsulated within Gerota's fascia (although it comes very close to the left psoas and quadratus lumborum musculature), does not involve the left renal vein, and does not appear to be associated with lymphadenopathy. Nonemergent Urologic consultation for surgical resection is strongly recommended in the near future. 3. 1.2 x 1.4 cm indeterminate nodule in the left adrenal gland. Attention  on followup studies is recommended, as a metastatic lesion is not excluded. 4. 4 mm subpleural nodules in the right middle lobe and left lower lobe. These are highly nonspecific, and favored to represent subpleural lymph nodes, but attention on followup studies is recommended to ensure stability. 5. Cholelithiasis without evidence of acute cholecystitis at this time. 6. Atherosclerosis, including left main and 3 vessel coronary artery disease. Please note that although the presence of coronary artery calcium documents the presence of coronary artery disease, the severity of this disease and any potential stenosis cannot be assessed on this non-gated CT examination. Assessment for potential risk factor modification, dietary therapy or pharmacologic therapy may be warranted, if clinically indicated. 7. There are calcifications of the aortic valve and mitral valve/annulus. Echocardiographic correlation for evaluation of potential valvular dysfunction may be warranted if clinically indicated. 8. Colonic diverticulosis without evidence of acute diverticulitis at this time. 9. Additional incidental findings, as above. These results were called by telephone at the time of interpretation on 08/29/2015 at 7:34 pm to Dr. Davonna Belling, who verbally acknowledged these results. Electronically Signed   By: Vinnie Langton M.D.   On: 08/29/2015 19:37   Ct Abdomen Pelvis W Contrast  08/29/2015  CLINICAL DATA:  60 year old female with 2-3 day history of vomiting. Abdominal pain for the past month. Loss of voice. EXAM: CT CHEST, ABDOMEN, AND PELVIS WITH CONTRAST TECHNIQUE: Multidetector CT imaging of the chest, abdomen and pelvis was performed following the standard protocol during bolus administration of intravenous contrast. CONTRAST:  31mL OMNIPAQUE IOHEXOL 300 MG/ML  SOLN COMPARISON:  No priors. FINDINGS: CT CHEST FINDINGS Mediastinum/Lymph Nodes: Heart size is normal. There is no significant pericardial fluid, thickening  or pericardial calcification. There is atherosclerosis of the  thoracic aorta, the great vessels of the mediastinum and the coronary arteries, including calcified atherosclerotic plaque in the left main, left anterior descending, left circumflex and right coronary arteries. Calcifications of the aortic valve and mitral valve/annulus. No pathologically enlarged mediastinal or hilar lymph nodes. Esophagus is unremarkable in appearance. No axillary lymphadenopathy. Lungs/Pleura: 4 mm subpleural nodule in the lateral segment of the right middle lobe (image 31 of series 4). 4 mm subpleural nodule in the posterior left lower lobe (image 38 of series 4). No other suspicious appearing pulmonary nodules or masses. No acute consolidative airspace disease. No pleural effusions. Musculoskeletal/Soft Tissues: There are no aggressive appearing lytic or blastic lesions noted in the visualized portions of the skeleton. CT ABDOMEN AND PELVIS FINDINGS Hepatobiliary: No cystic or solid hepatic lesions. No intra or extrahepatic biliary ductal dilatation. 7 mm calcified gallstone lying dependently in the gallbladder. No current findings to suggest an acute cholecystitis at this time. Pancreas: No pancreatic mass. No pancreatic ductal dilatation. No pancreatic or peripancreatic fluid or inflammatory changes. Spleen: Unremarkable. Adrenals/Urinary Tract: In the lower pole of the left kidney there is a 5.9 x 6.1 x 6.3 cm heterogeneously enhancing lesion highly concerning for renal cell carcinoma. The inferior aspect of this lesion comes in very close proximity to the anterior aspect of the left psoas muscle and quadratus lumborum muscle, however, there appears to be an intervening fat plane at this time. The lesion is well separated from the left renal vein. Right kidney and right adrenal gland are normal in appearance. 1.2 x 1.4 cm indeterminate nodule in the lateral limb of the left adrenal gland. No hydroureteronephrosis. Urinary bladder  is largely decompressed, but otherwise unremarkable in appearance. Stomach/Bowel: Normal appearance of the stomach. No pathologic dilatation of small bowel or colon. A few scattered colonic diverticulae are noted, without surrounding inflammatory changes to suggest an acute diverticulitis at this time. Appendix is normal. Vascular/Lymphatic: Atherosclerosis throughout the abdominal and pelvic vasculature, without evidence of aneurysm or dissection. Left renal vein is widely patent and separate from the lower pole mass. No lymphadenopathy noted in the abdomen or pelvis. Reproductive: Uterus and ovaries are unremarkable in appearance. Other: No significant volume of ascites.  No pneumoperitoneum. Musculoskeletal: There are no aggressive appearing lytic or blastic lesions noted in the visualized portions of the skeleton. IMPRESSION: 1. No acute findings in the abdomen or pelvis to account for the patient's symptoms. 2. However, there is a 5.9 x 6.1 x 6.3 cm heterogeneously enhancing mass in the lower pole of the left kidney highly concerning for renal cell carcinoma. At this time, the lesion appears likely to be encapsulated within Gerota's fascia (although it comes very close to the left psoas and quadratus lumborum musculature), does not involve the left renal vein, and does not appear to be associated with lymphadenopathy. Nonemergent Urologic consultation for surgical resection is strongly recommended in the near future. 3. 1.2 x 1.4 cm indeterminate nodule in the left adrenal gland. Attention on followup studies is recommended, as a metastatic lesion is not excluded. 4. 4 mm subpleural nodules in the right middle lobe and left lower lobe. These are highly nonspecific, and favored to represent subpleural lymph nodes, but attention on followup studies is recommended to ensure stability. 5. Cholelithiasis without evidence of acute cholecystitis at this time. 6. Atherosclerosis, including left main and 3 vessel  coronary artery disease. Please note that although the presence of coronary artery calcium documents the presence of coronary artery disease, the severity of this disease  and any potential stenosis cannot be assessed on this non-gated CT examination. Assessment for potential risk factor modification, dietary therapy or pharmacologic therapy may be warranted, if clinically indicated. 7. There are calcifications of the aortic valve and mitral valve/annulus. Echocardiographic correlation for evaluation of potential valvular dysfunction may be warranted if clinically indicated. 8. Colonic diverticulosis without evidence of acute diverticulitis at this time. 9. Additional incidental findings, as above. These results were called by telephone at the time of interpretation on 08/29/2015 at 7:34 pm to Dr. Davonna Belling, who verbally acknowledged these results. Electronically Signed   By: Vinnie Langton M.D.   On: 08/29/2015 19:37    ASSESSMENT & PLAN:  Supraglottic cancer  Subpleural lung nodules She would benefit from staging PET CT scan as an outpatient. She will benefit from radiation oncology consultation. With concurrent diagnosis of possible renal cell carcinoma, I am not sure what makes sense to treat her aggressively with concurrent chemoradiation therapy. I will discuss with family tomorrow. I plan to return around 3 PM Saturday for family meeting. The patient will call her daughter and inform her about this planned meeting.  Left renal mass with adrenal gland nodule  this is highly suspicious for renal cell carcinoma. She is not symptomatic although undiagnosed kidney cancer can cause severe anemia. According to urology consult, plan for nephrectomy in the future   Severe anemia  this is multifactorial, likely anemia of chronic disease along with possible GI bleed due to history of hematochezia. She is transfused and a blood count is stable. Continue close monitoring.   Discharge  planning This is a complicated case. The patient is young but has concurrent diagnosis of kidney cancer and head and neck cancer with possibility of metastatic disease. We will discuss the case at the next ENT tumor board. Biopsies are pending. I will have family meeting tomorrow to discuss general plan of care. She would benefit from radiation oncology consultation as an outpatient  if she is stable, she could potentially be discharged over the next 24-48 hours with outpatient follow-up.  All questions were answered. The patient knows to call the clinic with any problems, questions or concerns. I spent 40 minutes counseling the patient face to face. The total time spent in the appointment was 70 minutes and more than 50% was on counseling.     [facility], [place], MD 09/02/2015 4:48 PM

## 2015-09-02 NOTE — Anesthesia Postprocedure Evaluation (Signed)
Anesthesia Post Note  Patient: Ashley Ramsey  Procedure(s) Performed: Procedure(s) (LRB): DIRECT LARYNGOSCOPY WITH BIOPSY (N/A) ESOPHAGOSCOPY (N/A)  Patient location during evaluation: PACU Anesthesia Type: General Level of consciousness: awake and alert Pain management: pain level controlled Vital Signs Assessment: post-procedure vital signs reviewed and stable Respiratory status: spontaneous breathing, nonlabored ventilation, respiratory function stable and patient connected to nasal cannula oxygen Cardiovascular status: blood pressure returned to baseline and stable Postop Assessment: no signs of nausea or vomiting Anesthetic complications: no    Last Vitals:  Filed Vitals:   09/02/15 1349 09/02/15 1355  BP: 167/87 142/73  Pulse: 110 101  Temp:  36 C  Resp: 26 17    Last Pain:  Filed Vitals:   09/02/15 1359  PainSc: 0-No pain                 Zenaida Deed

## 2015-09-02 NOTE — Progress Notes (Signed)
Pt complained of pain. Pt unable to safely swallow per Speech therapy. MD text paged to request IV pain medication. Waiting for MD orders now. Dorita Fray 09/02/2015 3:32 PM

## 2015-09-02 NOTE — H&P (View-Only) (Signed)
Reason for Consult:Laryngeal mass Referring Physician: Jonetta Osgood, MD  [name] Ashley Ramsey is an 60 y.o. female.  HPI: 4 month history of progressively worsening dysphagia, and loss of voice. Admitted late yesterday due to inability to adequately take in po liquids. She has lost about 200 pounds in the past year or so. She is a long time smoker and drinker and has quit smoking about a month ago.  Past Medical History  Diagnosis Date  . Thyroid disease   . Hypertension   . Neck mass hospitalized 08/29/2015  . Type II diabetes mellitus [facility])     Past Surgical History  Procedure Laterality Date  . No past surgeries      Family History  Problem Relation Age of Onset  . Diabetes Mother   . Hypertension Mother   . Heart disease Mother   . Diabetes Sister   . Hypertension Sister   . Cancer Daughter     Stomach    Social History:  reports that she quit smoking about 6 weeks ago. Her smoking use included Cigarettes. She has a 17 pack-year smoking history. She has never used smokeless tobacco. She reports that she drinks about 15.0 oz of alcohol per week. She reports that she does not use illicit drugs.  Allergies: No Known Allergies  Medications: Reviewed  Results for orders placed or performed during the hospital encounter of 08/29/15 (from the past 48 hour(s))  Lipase, blood     Status: None   Collection Time: 08/29/15  2:46 PM  Result Value Ref Range   Lipase 22 11 - 51 U/L  Comprehensive metabolic panel     Status: Abnormal   Collection Time: 08/29/15  2:46 PM  Result Value Ref Range   Sodium 138 135 - 145 mmol/L   Potassium 3.1 (L) 3.5 - 5.1 mmol/L   Chloride 96 (L) 101 - 111 mmol/L   CO2 27 22 - 32 mmol/L   Glucose, Bld 166 (H) 65 - 99 mg/dL   BUN 53 (H) 6 - 20 mg/dL   Creatinine, Ser 1.47 (H) 0.44 - 1.00 mg/dL   Calcium 9.1 8.9 - 10.3 mg/dL   Total Protein 7.3 6.5 - 8.1 g/dL   Albumin 2.9 (L) 3.5 - 5.0 g/dL   AST 13 (L) 15 - 41 U/L   ALT 13 (L) 14 - 54 U/L    Alkaline Phosphatase 88 38 - 126 U/L   Total Bilirubin 0.4 0.3 - 1.2 mg/dL   GFR calc non Af Amer 38 (L) >60 mL/min   GFR calc Af Amer 44 (L) >60 mL/min    Comment: (NOTE) The eGFR has been calculated using the CKD EPI equation. This calculation has not been validated in all clinical situations. eGFR's persistently <60 mL/min signify possible Chronic Kidney Disease.    Anion gap 15 5 - 15  CBC     Status: Abnormal   Collection Time: 08/29/15  2:46 PM  Result Value Ref Range   WBC 24.8 (H) 4.0 - 10.5 K/uL   RBC 4.25 3.87 - 5.11 MIL/uL   Hemoglobin 10.9 (L) 12.0 - 15.0 g/dL   HCT 34.8 (L) 36.0 - 46.0 %   MCV 81.9 78.0 - 100.0 fL   MCH 25.6 (L) 26.0 - 34.0 pg   MCHC 31.3 30.0 - 36.0 g/dL   RDW 13.5 11.5 - 15.5 %   Platelets 287 150 - 400 K/uL  Urinalysis, Routine w reflex microscopic (not at [facility])     Status: Abnormal  Collection Time: 08/29/15  9:09 PM  Result Value Ref Range   Color, Urine YELLOW YELLOW   APPearance CLOUDY (A) CLEAR   Specific Gravity, Urine 1.025 1.005 - 1.030   pH 5.0 5.0 - 8.0   Glucose, UA NEGATIVE NEGATIVE mg/dL   Hgb urine dipstick MODERATE (A) NEGATIVE   Bilirubin Urine MODERATE (A) NEGATIVE   Ketones, ur 15 (A) NEGATIVE mg/dL   Protein, ur NEGATIVE NEGATIVE mg/dL   Nitrite NEGATIVE NEGATIVE   Leukocytes, UA MODERATE (A) NEGATIVE  Urine microscopic-add on     Status: Abnormal   Collection Time: 08/29/15  9:09 PM  Result Value Ref Range   Squamous Epithelial / LPF 0-5 (A) NONE SEEN   WBC, UA TOO NUMEROUS TO COUNT 0 - 5 WBC/hpf   RBC / HPF 6-30 0 - 5 RBC/hpf   Bacteria, UA MANY (A) NONE SEEN   Casts HYALINE CASTS (A) NEGATIVE   Urine-Other TRICHOMONAS PRESENT   Lactic acid, plasma     Status: None   Collection Time: 08/29/15 10:52 PM  Result Value Ref Range   Lactic Acid, Venous 1.2 0.5 - 2.0 mmol/L  Creatinine, urine, random     Status: None   Collection Time: 08/29/15 11:07 PM  Result Value Ref Range   Creatinine, Urine 164.04 mg/dL   Sodium, urine, random     Status: None   Collection Time: 08/29/15 11:07 PM  Result Value Ref Range   Sodium, Ur 32 mmol/L  Basic metabolic panel     Status: Abnormal   Collection Time: 08/30/15  1:42 AM  Result Value Ref Range   Sodium 137 135 - 145 mmol/L   Potassium 2.9 (L) 3.5 - 5.1 mmol/L   Chloride 96 (L) 101 - 111 mmol/L   CO2 31 22 - 32 mmol/L   Glucose, Bld 165 (H) 65 - 99 mg/dL   BUN 56 (H) 6 - 20 mg/dL   Creatinine, Ser 1.46 (H) 0.44 - 1.00 mg/dL   Calcium 8.3 (L) 8.9 - 10.3 mg/dL   GFR calc non Af Amer 38 (L) >60 mL/min   GFR calc Af Amer 44 (L) >60 mL/min    Comment: (NOTE) The eGFR has been calculated using the CKD EPI equation. This calculation has not been validated in all clinical situations. eGFR's persistently <60 mL/min signify possible Chronic Kidney Disease.    Anion gap 10 5 - 15  CBC     Status: Abnormal   Collection Time: 08/30/15  1:42 AM  Result Value Ref Range   WBC 20.4 (H) 4.0 - 10.5 K/uL   RBC 3.39 (L) 3.87 - 5.11 MIL/uL   Hemoglobin 8.8 (L) 12.0 - 15.0 g/dL   HCT 27.6 (L) 36.0 - 46.0 %   MCV 81.4 78.0 - 100.0 fL   MCH 26.0 26.0 - 34.0 pg   MCHC 31.9 30.0 - 36.0 g/dL   RDW 13.7 11.5 - 15.5 %   Platelets 217 150 - 400 K/uL  Lactic acid, plasma     Status: None   Collection Time: 08/30/15  1:42 AM  Result Value Ref Range   Lactic Acid, Venous 1.0 0.5 - 2.0 mmol/L  Magnesium     Status: Abnormal   Collection Time: 08/30/15  1:42 AM  Result Value Ref Range   Magnesium 1.5 (L) 1.7 - 2.4 mg/dL    Dg Chest 2 View  08/29/2015  CLINICAL DATA:  One month history of chest pain and hoarse voice. EXAM: CHEST  2 VIEW COMPARISON:  None. FINDINGS: The heart is borderline and enlarged. Mild tortuosity of the thoracic aorta. Low lung volumes with mild vascular crowding and streaky basilar atelectasis. There also bronchitic changes which could be acute or chronic. No infiltrates or effusions. The bony thorax is intact. IMPRESSION: Acute versus chronic  bronchitic change.  No infiltrates or effusions Electronically Signed   By: Marijo Sanes M.D.   On: 08/29/2015 16:15   Ct Soft Tissue Neck W Contrast  08/29/2015  CLINICAL DATA:  60 year old female with abnormal full waist for 1 month. Abdominal pain. Three days of vomiting. Initial encounter. EXAM: CT NECK WITH CONTRAST TECHNIQUE: Multidetector CT imaging of the neck was performed using the standard protocol following the bolus administration of intravenous contrast. CONTRAST:  62m OMNIPAQUE IOHEXOL 300 MG/ML SOLN in conjunction with contrast enhanced imaging of the chest, abdomen, and pelvis reported separately. COMPARISON:  CT chest abdomen and pelvis from today reported separately FINDINGS: Pharynx and larynx: Bulky supraglottic laryngeal tumor with marked soft tissue enlargement of the bilateral aryepiglottic folds and anterior commissure up to 17 mm in thickness. Tumor appears inseparable from the undersurface of the strap muscles suggesting extension through the thyroid cartilage bilaterally. Posterior hypopharynx involvement. The epiglottis is thickened, nodular, and distorted. Furthermore, the vallecula is mostly effaced and soft tissue at the base of tongue appears nodular and thickened. All told, tumor encompasses 37 x 40 x 58 mm (AP by transverse by CC). The other laryngeal cartilages appear spared. The palatine tonsils, soft palate, and nasopharynx appear within normal limits. Superior parapharyngeal spaces and retropharyngeal space are within normal limits. Salivary glands: Sublingual space, submandibular glands, and parotid glands are within normal limits. Thyroid: Coarsely calcified 2.3 cm right thyroid nodule. Subcentimeter left lobe nodule. Lymph nodes: Abnormal right level 3 node measuring 9 mm at the level of the thyroid cartilage (series 1, image 57). Small but asymmetric 5 mm right level IIIa lymph node at the level of the hyoid bone on the right (series 1, image 49). Bilateral level 2A  nodes appear symmetric measuring 8-9 mm in thickness. No level 4, 5, or level 1 lymphadenopathy. Vascular: Suboptimal intravascular contrast timing, but major vascular structures in the neck and at the skullbase appear to remain patent. Left greater than right carotid bifurcation calcified atherosclerosis. Limited intracranial: Negative.  Partially empty sella. Visualized orbits: Negative. Mastoids and visualized paranasal sinuses: Visualized paranasal sinuses and mastoids are clear. Skeleton: Mostly absent dentition. Periapical lucency about the residual left mandible molar. No osseous metastatic disease identified. Upper chest: Reported separately today. IMPRESSION: 1. 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 2. Suspect metastatic nodal disease at level 3 on the right (small but asymmetric up to 9 mm nodes). Bilateral level 2 nodes are indeterminate and also measure up to 9 mm individually. No cystic or necrotic nodes in the neck. 3. See CT chest abdomen and pelvis from today reported separately. Electronically Signed   By: HGenevie AnnM.D.   On: 08/29/2015 19:56   Ct Chest W Contrast  08/29/2015  CLINICAL DATA:  60year old female with 2-3 day history of vomiting. Abdominal pain for the past month. Loss of voice. EXAM: CT CHEST, ABDOMEN, AND PELVIS WITH CONTRAST TECHNIQUE: Multidetector CT imaging of the chest, abdomen and pelvis was performed following the standard protocol during bolus administration of intravenous contrast. CONTRAST:  730mOMNIPAQUE IOHEXOL 300 MG/ML  SOLN COMPARISON:  No priors. FINDINGS: CT CHEST FINDINGS Mediastinum/Lymph  Nodes: Heart size is normal. There is no significant pericardial fluid, thickening or pericardial calcification. There is atherosclerosis of the thoracic aorta, the great vessels of the mediastinum and the coronary arteries, including calcified atherosclerotic plaque in the left  main, left anterior descending, left circumflex and right coronary arteries. Calcifications of the aortic valve and mitral valve/annulus. No pathologically enlarged mediastinal or hilar lymph nodes. Esophagus is unremarkable in appearance. No axillary lymphadenopathy. Lungs/Pleura: 4 mm subpleural nodule in the lateral segment of the right middle lobe (image 31 of series 4). 4 mm subpleural nodule in the posterior left lower lobe (image 38 of series 4). No other suspicious appearing pulmonary nodules or masses. No acute consolidative airspace disease. No pleural effusions. Musculoskeletal/Soft Tissues: There are no aggressive appearing lytic or blastic lesions noted in the visualized portions of the skeleton. CT ABDOMEN AND PELVIS FINDINGS Hepatobiliary: No cystic or solid hepatic lesions. No intra or extrahepatic biliary ductal dilatation. 7 mm calcified gallstone lying dependently in the gallbladder. No current findings to suggest an acute cholecystitis at this time. Pancreas: No pancreatic mass. No pancreatic ductal dilatation. No pancreatic or peripancreatic fluid or inflammatory changes. Spleen: Unremarkable. Adrenals/Urinary Tract: In the lower pole of the left kidney there is a 5.9 x 6.1 x 6.3 cm heterogeneously enhancing lesion highly concerning for renal cell carcinoma. The inferior aspect of this lesion comes in very close proximity to the anterior aspect of the left psoas muscle and quadratus lumborum muscle, however, there appears to be an intervening fat plane at this time. The lesion is well separated from the left renal vein. Right kidney and right adrenal gland are normal in appearance. 1.2 x 1.4 cm indeterminate nodule in the lateral limb of the left adrenal gland. No hydroureteronephrosis. Urinary bladder is largely decompressed, but otherwise unremarkable in appearance. Stomach/Bowel: Normal appearance of the stomach. No pathologic dilatation of small bowel or colon. A few scattered colonic  diverticulae are noted, without surrounding inflammatory changes to suggest an acute diverticulitis at this time. Appendix is normal. Vascular/Lymphatic: Atherosclerosis throughout the abdominal and pelvic vasculature, without evidence of aneurysm or dissection. Left renal vein is widely patent and separate from the lower pole mass. No lymphadenopathy noted in the abdomen or pelvis. Reproductive: Uterus and ovaries are unremarkable in appearance. Other: No significant volume of ascites.  No pneumoperitoneum. Musculoskeletal: There are no aggressive appearing lytic or blastic lesions noted in the visualized portions of the skeleton. IMPRESSION: 1. No acute findings in the abdomen or pelvis to account for the patient's symptoms. 2. However, there is a 5.9 x 6.1 x 6.3 cm heterogeneously enhancing mass in the lower pole of the left kidney highly concerning for renal cell carcinoma. At this time, the lesion appears likely to be encapsulated within Gerota's fascia (although it comes very close to the left psoas and quadratus lumborum musculature), does not involve the left renal vein, and does not appear to be associated with lymphadenopathy. Nonemergent Urologic consultation for surgical resection is strongly recommended in the near future. 3. 1.2 x 1.4 cm indeterminate nodule in the left adrenal gland. Attention on followup studies is recommended, as a metastatic lesion is not excluded. 4. 4 mm subpleural nodules in the right middle lobe and left lower lobe. These are highly nonspecific, and favored to represent subpleural lymph nodes, but attention on followup studies is recommended to ensure stability. 5. Cholelithiasis without evidence of acute cholecystitis at this time. 6. Atherosclerosis, including left main and 3 vessel coronary artery disease. Please note  that although the presence of coronary artery calcium documents the presence of coronary artery disease, the severity of this disease and any potential  stenosis cannot be assessed on this non-gated CT examination. Assessment for potential risk factor modification, dietary therapy or pharmacologic therapy may be warranted, if clinically indicated. 7. There are calcifications of the aortic valve and mitral valve/annulus. Echocardiographic correlation for evaluation of potential valvular dysfunction may be warranted if clinically indicated. 8. Colonic diverticulosis without evidence of acute diverticulitis at this time. 9. Additional incidental findings, as above. These results were called by telephone at the time of interpretation on 08/29/2015 at 7:34 pm to Dr. Davonna Belling, who verbally acknowledged these results. Electronically Signed   By: Vinnie Langton M.D.   On: 08/29/2015 19:37   Ct Abdomen Pelvis W Contrast  08/29/2015  CLINICAL DATA:  60 year old female with 2-3 day history of vomiting. Abdominal pain for the past month. Loss of voice. EXAM: CT CHEST, ABDOMEN, AND PELVIS WITH CONTRAST TECHNIQUE: Multidetector CT imaging of the chest, abdomen and pelvis was performed following the standard protocol during bolus administration of intravenous contrast. CONTRAST:  72m OMNIPAQUE IOHEXOL 300 MG/ML  SOLN COMPARISON:  No priors. FINDINGS: CT CHEST FINDINGS Mediastinum/Lymph Nodes: Heart size is normal. There is no significant pericardial fluid, thickening or pericardial calcification. There is atherosclerosis of the thoracic aorta, the great vessels of the mediastinum and the coronary arteries, including calcified atherosclerotic plaque in the left main, left anterior descending, left circumflex and right coronary arteries. Calcifications of the aortic valve and mitral valve/annulus. No pathologically enlarged mediastinal or hilar lymph nodes. Esophagus is unremarkable in appearance. No axillary lymphadenopathy. Lungs/Pleura: 4 mm subpleural nodule in the lateral segment of the right middle lobe (image 31 of series 4). 4 mm subpleural nodule in the  posterior left lower lobe (image 38 of series 4). No other suspicious appearing pulmonary nodules or masses. No acute consolidative airspace disease. No pleural effusions. Musculoskeletal/Soft Tissues: There are no aggressive appearing lytic or blastic lesions noted in the visualized portions of the skeleton. CT ABDOMEN AND PELVIS FINDINGS Hepatobiliary: No cystic or solid hepatic lesions. No intra or extrahepatic biliary ductal dilatation. 7 mm calcified gallstone lying dependently in the gallbladder. No current findings to suggest an acute cholecystitis at this time. Pancreas: No pancreatic mass. No pancreatic ductal dilatation. No pancreatic or peripancreatic fluid or inflammatory changes. Spleen: Unremarkable. Adrenals/Urinary Tract: In the lower pole of the left kidney there is a 5.9 x 6.1 x 6.3 cm heterogeneously enhancing lesion highly concerning for renal cell carcinoma. The inferior aspect of this lesion comes in very close proximity to the anterior aspect of the left psoas muscle and quadratus lumborum muscle, however, there appears to be an intervening fat plane at this time. The lesion is well separated from the left renal vein. Right kidney and right adrenal gland are normal in appearance. 1.2 x 1.4 cm indeterminate nodule in the lateral limb of the left adrenal gland. No hydroureteronephrosis. Urinary bladder is largely decompressed, but otherwise unremarkable in appearance. Stomach/Bowel: Normal appearance of the stomach. No pathologic dilatation of small bowel or colon. A few scattered colonic diverticulae are noted, without surrounding inflammatory changes to suggest an acute diverticulitis at this time. Appendix is normal. Vascular/Lymphatic: Atherosclerosis throughout the abdominal and pelvic vasculature, without evidence of aneurysm or dissection. Left renal vein is widely patent and separate from the lower pole mass. No lymphadenopathy noted in the abdomen or pelvis. Reproductive: Uterus and  ovaries are unremarkable in appearance.  Other: No significant volume of ascites.  No pneumoperitoneum. Musculoskeletal: There are no aggressive appearing lytic or blastic lesions noted in the visualized portions of the skeleton. IMPRESSION: 1. No acute findings in the abdomen or pelvis to account for the patient's symptoms. 2. However, there is a 5.9 x 6.1 x 6.3 cm heterogeneously enhancing mass in the lower pole of the left kidney highly concerning for renal cell carcinoma. At this time, the lesion appears likely to be encapsulated within Gerota's fascia (although it comes very close to the left psoas and quadratus lumborum musculature), does not involve the left renal vein, and does not appear to be associated with lymphadenopathy. Nonemergent Urologic consultation for surgical resection is strongly recommended in the near future. 3. 1.2 x 1.4 cm indeterminate nodule in the left adrenal gland. Attention on followup studies is recommended, as a metastatic lesion is not excluded. 4. 4 mm subpleural nodules in the right middle lobe and left lower lobe. These are highly nonspecific, and favored to represent subpleural lymph nodes, but attention on followup studies is recommended to ensure stability. 5. Cholelithiasis without evidence of acute cholecystitis at this time. 6. Atherosclerosis, including left main and 3 vessel coronary artery disease. Please note that although the presence of coronary artery calcium documents the presence of coronary artery disease, the severity of this disease and any potential stenosis cannot be assessed on this non-gated CT examination. Assessment for potential risk factor modification, dietary therapy or pharmacologic therapy may be warranted, if clinically indicated. 7. There are calcifications of the aortic valve and mitral valve/annulus. Echocardiographic correlation for evaluation of potential valvular dysfunction may be warranted if clinically indicated. 8. Colonic diverticulosis  without evidence of acute diverticulitis at this time. 9. Additional incidental findings, as above. These results were called by telephone at the time of interpretation on 08/29/2015 at 7:34 pm to Dr. Davonna Belling, who verbally acknowledged these results. Electronically Signed   By: Vinnie Langton M.D.   On: 08/29/2015 19:37    [id number] except as listed in admit H&P  Blood pressure 103/57, pulse 114, temperature 99.1 F (37.3 C), temperature source Oral, resp. rate 18, height '5\' 3"'$  (1.6 m), weight 113.399 kg (250 lb), SpO2 98 %.  PHYSICAL EXAM: Overall appearance:  Obese lady, in no distress. She is aphonic, talking in a whisper, without any stridor. Head:  Normocephalic, atraumatic. Ears: External ears are normal. Nose: External nose is healthy in appearance. Internal nasal exam free of any lesions or obstruction. Oral Cavity/Pharynx:  There are no mucosal lesions or masses identified. Larynx/Hypopharynx: Fiiberoptc exam reveals a diffuse verucous appearing lesion encasing the entire supraglottic larynx. The cords are not visible. There is no recognizable laryngeal or hypopharyngeal anatomy. Neuro:  No identifiable neurologic deficits. Neck: No palpable neck masses.  Studies Reviewed: CT neck and chest.  Procedures: Flexible fiberoptic laryngoscopy. Topical afrin/xylocaine spray was applied to the nasal cavities. The fiberoptic scope was passed through the right side of the nose. The larynx and hypopharynx were inspected. The findings are all described above.   Assessment/Plan: Supraglottc laryngeal and hypopharyngeal mass. No obvious airway obstruction. This will need to be biopsied with operative laryngoscopy. This is most likely squamous cell carcinoma. Treatment may be chemo/XRT or possibly laryngopharyngectomy with flap reconstruction (which would need to be done in a University setting). She will need a feeding tube regardless. I can schedule laryngoscopy with biopsy either as  in patient or out patient.  Deetra Booton 08/30/2015, 9:14 AM

## 2015-09-02 NOTE — Progress Notes (Addendum)
SLP Cancellation Note  Patient Details Name: Ashley Ramsey MRN: [id number] DOB: 08/01/1956   Cancelled treatment:       Reason Eval/Treat Not Completed: Medical issues which prohibited therapy. Pt NPO pending procedure this morning. Per radiology, the latest she can be scheduled for MBS today would be 1:30. Tentatively scheduled pt at that time, although per RN she has not left for the OR yet. Will continue to follow.   UPDATE: Pt still off the unit for procedure @ 1:15. Per operative report, it appears as though she is still in the PACU at this time. Discussed with RN - MBS will not be able to be completed this afternoon. Recommend to keep pt NPO except for a few ice chips PRN after oral care pending MBS completion. Will reattempt on next date.   Germain Osgood, M.A. CCC-SLP [phone]  Germain Osgood 09/02/2015, 9:12 AM

## 2015-09-03 ENCOUNTER — Inpatient Hospital Stay (HOSPITAL_COMMUNITY): Payer: Medicaid Other

## 2015-09-03 LAB — GLUCOSE, CAPILLARY
Glucose-Capillary: 66 mg/dL (ref 65–99)
Glucose-Capillary: 85 mg/dL (ref 65–99)
Glucose-Capillary: 68 mg/dL (ref 65–99)
Glucose-Capillary: 122 mg/dL — ABNORMAL HIGH (ref 65–99)
Glucose-Capillary: 144 mg/dL — ABNORMAL HIGH (ref 65–99)

## 2015-09-03 LAB — CBC
HCT: 25.7 % — ABNORMAL LOW (ref 36.0–46.0)
Hemoglobin: 8.6 g/dL — ABNORMAL LOW (ref 12.0–15.0)
MCH: 28.2 pg (ref 26.0–34.0)
MCHC: 33.5 g/dL (ref 30.0–36.0)
MCV: 84.3 fL (ref 78.0–100.0)
PLATELETS: 210 10*3/uL (ref 150–400)
RBC: 3.05 MIL/uL — ABNORMAL LOW (ref 3.87–5.11)
RDW: 15.1 % (ref 11.5–15.5)
WBC: 21 10*3/uL — AB (ref 4.0–10.5)

## 2015-09-03 MED ORDER — LORATADINE 10 MG PO TABS
10.0000 mg | ORAL_TABLET | Freq: Every day | ORAL | Status: DC
Start: 2015-09-03 — End: 2015-09-08
  Administered 2015-09-03: 10 mg via ORAL
  Filled 2015-09-03 (×2): qty 1

## 2015-09-03 MED ORDER — DEXTROMETHORPHAN POLISTIREX ER 30 MG/5ML PO SUER: 30.0000 mg | Freq: Two times a day (BID) | ORAL | Status: DC | PRN

## 2015-09-03 MED ORDER — OXYMETAZOLINE HCL 0.05 % NA SOLN: 1.0000 | Freq: Two times a day (BID) | NASAL | Status: DC | PRN

## 2015-09-03 MED ORDER — RESOURCE THICKENUP CLEAR PO POWD
ORAL | Status: DC | PRN
  Filled 2015-09-03: qty 125

## 2015-09-03 MED ORDER — GLUCOSE 40 % PO GEL
ORAL | Status: AC
Start: 2015-09-03 — End: 2015-09-03
  Administered 2015-09-03: 37.5 g
  Filled 2015-09-03: qty 1

## 2015-09-03 NOTE — Progress Notes (Signed)
Ashley Ramsey   DOB:1955-11-28   [id number]    Subjective: She feels well today. She denies throat pain. She is currently on modified diet. Denies any pain or dizziness.  Objective:  Filed Vitals:   09/03/15 0102 09/03/15 0504  BP: 127/69 151/84  Pulse: 97 90  Temp: 97.4 F (36.3 C) 98.5 F (36.9 C)  Resp: 18 19     Intake/Output Summary (Last 24 hours) at 09/03/15 1545 Last data filed at 09/03/15 [id number]  Gross per 24 hour  Intake   1730 ml  Output    600 ml  Net   1130 ml    GENERAL:alert, no distress and comfortable SKIN: skin color, texture, turgor are normal, no rashes or significant lesions EYES: normal, Conjunctiva are pink and non-injected, sclera clear Musculoskeletal:no cyanosis of digits and no clubbing  NEURO: alert & oriented x 3 with fluent speech, no focal motor/sensory deficits   Labs:  Lab Results  Component Value Date   WBC 21.0* 09/03/2015   HGB 8.6* 09/03/2015   HCT 25.7* 09/03/2015   MCV 84.3 09/03/2015   PLT 210 09/03/2015   NEUTROABS 4.8 08/17/2013    Lab Results  Component Value Date   NA 146* 09/02/2015   K 3.6 09/02/2015   CL 112* 09/02/2015   CO2 24 09/02/2015    Studies:  Dg Swallowing Func-speech Pathology  09/03/2015  Objective Swallowing Evaluation:   Patient Details Name: Ashley Ramsey MRN: [id number] Date of Birth: June 10, 1956 Today's Date: 09/03/2015 Time: SLP Start Time (ACUTE ONLY): 1124-SLP Stop Time (ACUTE ONLY): 1143 SLP Time Calculation (min) (ACUTE ONLY): 19 min Past Medical History: Past Medical History Diagnosis Date . Thyroid disease  . Hypertension  . Neck mass hospitalized 08/29/2015 . Type II diabetes mellitus ([place])  . Left kidney mass  . Anemia, chronic disease  Past Surgical History: Past Surgical History Procedure Laterality Date . No past surgeries   HPI: Pt is 60 y.o. female with h/o hypertension and NIDDM. Pt presented to ED with voice changes, difficult swallowing and abdominal discomfort that have progressed over  the last month. 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 Subjective: pt alert, eager for food/drink Assessment / Plan / Recommendation CHL IP CLINICAL IMPRESSIONS 09/03/2015 Therapy Diagnosis Severe pharyngeal phase dysphagia Clinical Impression Pt has a severe pharyngeal dysphaghia secondary to presence of supraglottic mass, which impedes bolus flow and decreased airway closure. All consistencies enter the airway during the swallow even with tsp-sized boluses. Aspiration is usually sensed, but she is not able to expel aspirates from her trachea. A chin tuck paired with an immediate cough facilitates clearance through the pharynx and clearance of penetrates with all consistencies tested; however, all liquids are then re-penetrated and aspirated after the swallow. Purees do not re-enter the airway, although aspiration risk remains due to residue. Recommend Dys 1 diet and pudding thick liquids with a chin tuck and immediate cough following all bites. Impact on safety and function Moderate aspiration risk;Risk for inadequate nutrition/hydration   CHL IP TREATMENT RECOMMENDATION 09/03/2015 Treatment Recommendations Therapy as outlined in treatment plan below   Prognosis 09/03/2015 Prognosis for Safe Diet Advancement Guarded Barriers to Reach Goals -- Barriers/Prognosis Comment -- CHL IP DIET RECOMMENDATION 09/03/2015 SLP Diet Recommendations Dysphagia 1 (Puree) solids;Pudding thick liquid Liquid Administration via Spoon Medication Administration Crushed with puree Compensations Slow rate;Small sips/bites;Chin tuck;Hard cough after swallow Postural Changes Remain semi-upright after after feeds/meals (Comment);Seated upright at 90 degrees  CHL IP OTHER RECOMMENDATIONS 09/03/2015 Recommended Consults -- Oral Care Recommendations Oral care BID Other Recommendations Order thickener from pharmacy;Prohibited food (jello, ice cream, thin soups);Remove water pitcher   CHL IP  FOLLOW UP RECOMMENDATIONS 09/03/2015 Follow up Recommendations Outpatient SLP;24 hour supervision/assistance   CHL IP FREQUENCY AND DURATION 09/03/2015 Speech Therapy Frequency (ACUTE ONLY) min 2x/week Treatment Duration 2 weeks      CHL IP ORAL PHASE 09/03/2015 Oral Phase WFL Oral - Pudding Teaspoon -- Oral - Pudding Cup -- Oral - Honey Teaspoon -- Oral - Honey Cup -- Oral - Nectar Teaspoon -- Oral - Nectar Cup -- Oral - Nectar Straw -- Oral - Thin Teaspoon -- Oral - Thin Cup -- Oral - Thin Straw -- Oral - Puree -- Oral - Mech Soft -- Oral - Regular -- Oral - Multi-Consistency -- Oral - Pill -- Oral Phase - Comment --  CHL IP PHARYNGEAL PHASE 09/03/2015 Pharyngeal Phase Impaired Pharyngeal- Pudding Teaspoon -- Pharyngeal -- Pharyngeal- Pudding Cup -- Pharyngeal -- Pharyngeal- Honey Teaspoon Reduced airway/laryngeal closure;Penetration/Aspiration during swallow;Penetration/Apiration after swallow;Pharyngeal residue - pyriform;Pharyngeal residue - posterior pharnyx;Compensatory strategies attempted (with notebox) Pharyngeal Material enters airway, passes BELOW cords and not ejected out despite cough attempt by patient Pharyngeal- Honey Cup -- Pharyngeal -- Pharyngeal- Nectar Teaspoon Reduced airway/laryngeal closure;Penetration/Aspiration during swallow;Penetration/Apiration after swallow;Pharyngeal residue - pyriform;Pharyngeal residue - posterior pharnyx;Compensatory strategies attempted (with notebox) Pharyngeal Material enters airway, passes BELOW cords and not ejected out despite cough attempt by patient Pharyngeal- Nectar Cup -- Pharyngeal -- Pharyngeal- Nectar Straw -- Pharyngeal -- Pharyngeal- Thin Teaspoon Reduced airway/laryngeal closure;Penetration/Aspiration during swallow;Penetration/Apiration after swallow;Pharyngeal residue - pyriform;Pharyngeal residue - posterior pharnyx;Compensatory strategies attempted (with notebox) Pharyngeal Material enters airway, passes BELOW cords and not ejected out despite  cough attempt by patient Pharyngeal- Thin Cup -- Pharyngeal -- Pharyngeal- Thin Straw Reduced airway/laryngeal closure;Penetration/Aspiration during swallow;Penetration/Apiration after swallow;Pharyngeal residue - pyriform;Pharyngeal residue - posterior pharnyx;Compensatory strategies attempted (with notebox) Pharyngeal Material enters airway, passes BELOW cords and not ejected out despite cough attempt by patient Pharyngeal- Puree Reduced airway/laryngeal closure;Penetration/Aspiration during swallow;Compensatory strategies attempted (with notebox);Pharyngeal residue - posterior pharnyx Pharyngeal Material enters airway, remains ABOVE vocal cords and not ejected out Pharyngeal- Mechanical Soft -- Pharyngeal -- Pharyngeal- Regular -- Pharyngeal -- Pharyngeal- Multi-consistency -- Pharyngeal -- Pharyngeal- Pill -- Pharyngeal -- Pharyngeal Comment --  CHL IP CERVICAL ESOPHAGEAL PHASE 09/03/2015 Cervical Esophageal Phase (No Data) Pudding Teaspoon -- Pudding Cup -- Honey Teaspoon -- Honey Cup -- Nectar Teaspoon -- Nectar Cup -- Nectar Straw -- Thin Teaspoon -- Thin Cup -- Thin Straw -- Puree -- Mechanical Soft -- Regular -- Multi-consistency -- Pill -- Cervical Esophageal Comment -- No flowsheet data found. Germain Osgood, M.A. CCC-SLP [phone] Germain Osgood 09/03/2015, 1:33 PM               Assessment & Plan:  Supraglottic cancer  Subpleural lung nodules She would benefit from staging PET CT scan as an outpatient. She will benefit from radiation oncology consultation. With concurrent diagnosis of possible renal cell carcinoma, I am not sure what makes sense to treat her aggressively with concurrent chemoradiation therapy. I have arranged to meet with her daughter today and had been by her room 4 times between 255 pm to 345 pm but none of her family members were present. I will try to come by again tomorrow around 2 PM to discuss treatment options and prognosis  Left renal mass with adrenal gland  nodule this is highly suspicious for renal cell carcinoma. She is  not symptomatic although undiagnosed kidney cancer can cause severe anemia. According to urology consult, plan for nephrectomy in the future   Severe anemia this is multifactorial, likely anemia of chronic disease along with possible GI bleed due to history of hematochezia. She is transfused and a blood count is stable. Continue close monitoring.   Discharge planning This is a complicated case. The patient is young but has concurrent diagnosis of kidney cancer and head and neck cancer with possibility of metastatic disease. We will discuss the case at the next ENT tumor board. Biopsies are pending.  If she is stable, she could potentially be discharged over the next 24-48 hours with outpatient follow-up.   [facility], [place], MD 09/03/2015  3:45 PM

## 2015-09-03 NOTE — Progress Notes (Signed)
UR COMPLETED  

## 2015-09-03 NOTE — Progress Notes (Signed)
PATIENT DETAILS Name: Ashley Ramsey Age: 60 y.o. Sex: female Date of Birth: [birth year] Admit Date: 08/29/2015 Admitting Physician [name], MD [id number], [name], MD  Brief narrative: 60 year old female with past medical history of hypertension, type 2 diabetes presented to the ED with worsening hoarseness of voice and abdominal pain. Upon further evaluation with a CT neck she was found to have a laryngeal mask, CT abdomen revealed a left renal mass. She was also found to have UTI with SIRS. She was admitted and started on IV antibiotics, ENT was consulted-patient underwent a biopsy on 1/20. Hospital course has been complicated by development of lower GI bleeding and associated blood loss anemia. GI bleeding seems to have resolved him a hemoglobin remained stable, oncology has been consulted. Plans are to continue to monitor for another day or so before discharging home.  Subjective: No further hematochezia for 48 hours. Hemoglobin stable. Oncology meeting with family at 3 PM today.  Assessment/Plan: Active Problems: Probable head and neck cancer: ENT consulted, underwent laryngoscopy with biopsy on 1/20. Oncology consulted, meeting with family today. Suspect discharge home in the next 1-2 days. Patient aware that she will need outpatient follow-up with oncology.  Left renal mass: Highly suspicious for renal cell carcinoma. Doubt it is related to above. Spoke with Dr. Ree Kida MD on 1/17,he reviewed patient's CT scan in chart - recommends outpatient follow-up with him in the office for consideration of nephrectomy in the future.  No history of hematuria. Renal function stable within normal limits.   Dysphagia: Secondary to known laryngeal mass. Speech therapy following-completed MBS-recommendations are for dysphagia 1 diet. Await arrival of family-will discuss with patient/family regarding risks of aspiration.  E coli pyelonephritis with SIRS: Sensitivities  noted, continue Rocephin-stop date on 1/22.  Afebrile however WBC fluctuating-suspect persistent leukocytosis from inflammation due to malignancy-rather than UTI.  Lower GI bleeding: Seems to have resolved-no further hematochezia for the past 48 hours.  Suspect Diverticular bleed. CT scan abdomen shows diverticulosis. GI recommends holding off on colonoscopy at this time due to risks of the procedure and the need to address more pressing issues. Transfused a total of 3 units so far, Hb stable today  Acute blood loss anemia: Likely secondary to hematochezia, probably has chronic anemia from chronic disease. Transfused a total of 3 units today, Hgb stable at 8.36.Follow CBC.  Acute renal failure: Likely prerenal azotemia in a setting of UTI/GI bleeding-along with lisinopril/HCTZ use.  Resolved with hydration. Continue to monitor BMP daily given acute blood loss.  Hypernatremia:change to 0.45 NS-follow electrolytes  Hypokalemia: Repleted-follow periodically  Type 2 diabetes: Controlled with CBGs 110-130s. Continue SSI, hold metformin. Follow CBGs  Hypertension: Controlled. Continue to hold lisinopril/HCTZ.   Tobacco abuse: Counseled  Alcohol abuse: Counseled, completed Ativan per CIWA protocol-no signs of withdrawal  Left adrenal gland nodule: Will need outpatient follow-up, especially given left adrenal mass.  Subpleural nodules in the right middle lobe of right lung and left lower lobe of left lung: Will need to repeat CT chest in 6 months. Stable on room air, no shortness of breath  Possible CAD: Atherosclerosis seen CT chest. Unfortunately with ongoing lower GI bleeding-unable to use antiplatelets. Echo with LFEV 0000000, grade 1 diastolic dysfunction, no wall motion abnormalities. Given numerous above medical problems-not a candidate for any procedures/further workup at this point.  Morbid obesity: Has lost significant amount of weight over the past few months.  Disposition:  Remains  inpatient-Home in next few days  Antimicrobial agents  See below  Anti-infectives    Start     Dose/Rate Route Frequency Ordered Stop   08/29/15 2330  cefTRIAXone (ROCEPHIN) 1 g in dextrose 5 % 50 mL IVPB     1 g 100 mL/hr over 30 Minutes Intravenous Every 24 hours 08/29/15 2250        DVT Prophylaxis: SCD's, no pharmacological DVT ppx given GI bleed/anemia  Code Status: Full code   Family Communication Friend at bedside.  Procedures: ECHO- TTE Study Conclusions - Left ventricle: The cavity size was normal. Wall thickness was normal. Systolic function was normal. The estimated ejectionfraction was in the range of 55% to 60%. Wall motion was normal;there were no regional wall motion abnormalities. Dopplerparameters are consistent with abnormal left ventricularrelaxation (grade 1 diastolic dysfunction). Doppler parametersare consistent with high ventricular filling pressure. - Aortic valve: There was very mild stenosis. There was trivialregurgitation. - Mitral valve: Calcified annulus. Mildly thickened leaflets . - Left atrium: The atrium was mildly dilated.  CONSULTS:  GI, urology and ent  Time spent 30 minutes-Greater than 50% of this time was spent in counseling, explanation of diagnosis, planning of further management, and coordination of care.  MEDICATIONS: Scheduled Meds: . sodium chloride   Intravenous Once  . acetaminophen  650 mg Oral Once  . cefTRIAXone (ROCEPHIN)  IV  1 g Intravenous Q24H  . folic acid  1 mg Oral Daily  . Influenza vac split quadrivalent PF  0.5 mL Intramuscular Tomorrow-1000  . insulin aspart  0-9 Units Subcutaneous TID WC  . multivitamin with minerals  1 tablet Oral Daily  . pantoprazole (PROTONIX) IV  40 mg Intravenous Q12H  . thiamine  100 mg Oral Daily   Or  . thiamine  100 mg Intravenous Daily   Continuous Infusions: . sodium chloride 75 mL/hr at 09/03/15 0457  . lactated ringers 10 mL/hr at 09/02/15 1128   PRN  Meds:.acetaminophen **OR** acetaminophen, HYDROcodone-acetaminophen, lidocaine, morphine injection, ondansetron **OR** ondansetron (ZOFRAN) IV, oxymetazoline, RESOURCE THICKENUP CLEAR    PHYSICAL EXAM: Vital signs in last 24 hours: Filed Vitals:   09/02/15 1427 09/02/15 2123 09/03/15 0102 09/03/15 0504  BP: 166/76 137/74 127/69 151/84  Pulse: 102 80 97 90  Temp: 98.2 F (36.8 C) 97.7 F (36.5 C) 97.4 F (36.3 C) 98.5 F (36.9 C)  TempSrc: Oral Oral Oral Oral  Resp: 18 18 18 19   Height:      Weight:      SpO2: 99% 100% 99% 100%    Weight change:  Filed Weights   08/29/15 1422  Weight: 113.399 kg (250 lb)   Body mass index is 44.3 kg/(m^2).   Gen Exam: Awake and alert with clear speech. Obese. Voice is very hoarse and weak. Neck: Supple-exam limited by body habitus. No stridor Chest:  CVS: S1 S2 regular. Abdomen: soft, non tender Extremities:  Neurologic: Non Focal Psych: cooperative Wounds: N/A.   Intake/Output from previous day:  Intake/Output Summary (Last 24 hours) at 09/03/15 1419 Last data filed at 09/03/15 0635  Gross per 24 hour  Intake   1730 ml  Output    600 ml  Net   1130 ml     LAB RESULTS: CBC  Recent Labs Lab 09/01/15 0735 09/01/15 2000 09/02/15 0547 09/02/15 1946 09/03/15 0530  WBC 19.1* 18.9* 17.0* 18.5* 21.0*  HGB 6.7* 8.0* 8.3* 8.2* 8.6*  HCT 20.8* 24.7* 25.8* 25.0* 25.7*  PLT 197 203 191 202 210  MCV 84.2 83.7 83.2 83.9 84.3  MCH 27.1 27.1 26.8 27.5 28.2  MCHC 32.2 32.4 32.2 32.8 33.5  RDW 14.3 14.1 14.5 14.8 15.1    Chemistries   Recent Labs Lab 08/29/15 1446 08/30/15 0142 08/31/15 1028 09/01/15 0511 09/02/15 0547  NA 138 137 144 148* 146*  K 3.1* 2.9* 3.9 3.8 3.6  CL 96* 96* 113* 116* 112*  CO2 27 31 24 27 24   GLUCOSE 166* 165* 174* 127* 101*  BUN 53* 56* 36* 28* 22*  CREATININE 1.47* 1.46* 0.88 0.84 0.82  CALCIUM 9.1 8.3* 8.6* 8.2* 8.4*  MG  --  1.5* 1.8  --   --     CBG:  Recent Labs Lab 09/02/15 1620  09/02/15 2121 09/03/15 0812 09/03/15 0939 09/03/15 1219  GLUCAP 119* 139* 66 85 68    GFR Estimated Creatinine Clearance: 89.6 mL/min (by C-G formula based on Cr of 0.82).  Coagulation profile No results for input(s): INR, PROTIME in the last 168 hours.  Cardiac Enzymes No results for input(s): CKMB, TROPONINI, MYOGLOBIN in the last 168 hours.  Invalid input(s): CK  Invalid input(s): POCBNP No results for input(s): DDIMER in the last 72 hours. No results for input(s): HGBA1C in the last 72 hours. No results for input(s): CHOL, HDL, LDLCALC, TRIG, CHOLHDL, LDLDIRECT in the last 72 hours. No results for input(s): TSH, T4TOTAL, T3FREE, THYROIDAB in the last 72 hours.  Invalid input(s): FREET3 No results for input(s): VITAMINB12, FOLATE, FERRITIN, TIBC, IRON, RETICCTPCT in the last 72 hours. No results for input(s): LIPASE, AMYLASE in the last 72 hours.  Urine Studies No results for input(s): UHGB, CRYS in the last 72 hours.  Invalid input(s): UACOL, UAPR, USPG, UPH, UTP, UGL, UKET, UBIL, UNIT, UROB, ULEU, UEPI, UWBC, URBC, UBAC, CAST, UCOM, BILUA  MICROBIOLOGY: Recent Results (from the past 240 hour(s))  Culture, Urine     Status: None   Collection Time: 08/29/15  9:09 PM  Result Value Ref Range Status   Specimen Description URINE, CATHETERIZED  Final   Special Requests NONE  Final   Culture >=100,000 COLONIES/mL ESCHERICHIA COLI  Final   Report Status 09/01/2015 FINAL  Final   Organism ID, Bacteria ESCHERICHIA COLI  Final      Susceptibility   Escherichia coli - MIC*    AMPICILLIN >=32 RESISTANT Resistant     CEFAZOLIN <=4 SENSITIVE Sensitive     CEFTRIAXONE <=1 SENSITIVE Sensitive     CIPROFLOXACIN <=0.25 SENSITIVE Sensitive     GENTAMICIN <=1 SENSITIVE Sensitive     IMIPENEM <=0.25 SENSITIVE Sensitive     NITROFURANTOIN <=16 SENSITIVE Sensitive     TRIMETH/SULFA <=20 SENSITIVE Sensitive     AMPICILLIN/SULBACTAM 4 SENSITIVE Sensitive     PIP/TAZO <=4 SENSITIVE  Sensitive     * >=100,000 COLONIES/mL ESCHERICHIA COLI  Surgical pcr screen     Status: None   Collection Time: 09/01/15  4:09 AM  Result Value Ref Range Status   MRSA, PCR NEGATIVE NEGATIVE Final   Staphylococcus aureus NEGATIVE NEGATIVE Final    Comment:        The Xpert SA Assay (FDA approved for NASAL specimens in patients over 63 years of age), is one component of a comprehensive surveillance program.  Test performance has been validated by [facility] for patients greater than or equal to 35 year old. It is not intended to diagnose infection nor to guide or monitor treatment.     RADIOLOGY STUDIES/RESULTS: Dg Chest 2 View  08/29/2015  CLINICAL DATA:  One month history of chest pain and hoarse voice. EXAM: CHEST  2 VIEW COMPARISON:  None. FINDINGS: The heart is borderline and enlarged. Mild tortuosity of the thoracic aorta. Low lung volumes with mild vascular crowding and streaky basilar atelectasis. There also bronchitic changes which could be acute or chronic. No infiltrates or effusions. The bony thorax is intact. IMPRESSION: Acute versus chronic bronchitic change.  No infiltrates or effusions Electronically Signed   By: Marijo Sanes M.D.   On: 08/29/2015 16:15   Ct Soft Tissue Neck W Contrast  08/29/2015  CLINICAL DATA:  60 year old female with abnormal full waist for 1 month. Abdominal pain. Three days of vomiting. Initial encounter. EXAM: CT NECK WITH CONTRAST TECHNIQUE: Multidetector CT imaging of the neck was performed using the standard protocol following the bolus administration of intravenous contrast. CONTRAST:  30mL OMNIPAQUE IOHEXOL 300 MG/ML SOLN in conjunction with contrast enhanced imaging of the chest, abdomen, and pelvis reported separately. COMPARISON:  CT chest abdomen and pelvis from today reported separately FINDINGS: Pharynx and larynx: Bulky supraglottic laryngeal tumor with marked soft tissue enlargement of the bilateral aryepiglottic folds and anterior  commissure up to 17 mm in thickness. Tumor appears inseparable from the undersurface of the strap muscles suggesting extension through the thyroid cartilage bilaterally. Posterior hypopharynx involvement. The epiglottis is thickened, nodular, and distorted. Furthermore, the vallecula is mostly effaced and soft tissue at the base of tongue appears nodular and thickened. All told, tumor encompasses 37 x 40 x 58 mm (AP by transverse by CC). The other laryngeal cartilages appear spared. The palatine tonsils, soft palate, and nasopharynx appear within normal limits. Superior parapharyngeal spaces and retropharyngeal space are within normal limits. Salivary glands: Sublingual space, submandibular glands, and parotid glands are within normal limits. Thyroid: Coarsely calcified 2.3 cm right thyroid nodule. Subcentimeter left lobe nodule. Lymph nodes: Abnormal right level 3 node measuring 9 mm at the level of the thyroid cartilage (series 1, image 57). Small but asymmetric 5 mm right level IIIa lymph node at the level of the hyoid bone on the right (series 1, image 49). Bilateral level 2A nodes appear symmetric measuring 8-9 mm in thickness. No level 4, 5, or level 1 lymphadenopathy. Vascular: Suboptimal intravascular contrast timing, but major vascular structures in the neck and at the skullbase appear to remain patent. Left greater than right carotid bifurcation calcified atherosclerosis. Limited intracranial: Negative.  Partially empty sella. Visualized orbits: Negative. Mastoids and visualized paranasal sinuses: Visualized paranasal sinuses and mastoids are clear. Skeleton: Mostly absent dentition. Periapical lucency about the residual left mandible molar. No osseous metastatic disease identified. Upper chest: Reported separately today. IMPRESSION: 1. 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 2. Suspect metastatic nodal disease  at level 3 on the right (small but asymmetric up to 9 mm nodes). Bilateral level 2 nodes are indeterminate and also measure up to 9 mm individually. No cystic or necrotic nodes in the neck. 3. See CT chest abdomen and pelvis from today reported separately. Electronically Signed   By: Genevie Ann M.D.   On: 08/29/2015 19:56   Ct Chest W Contrast  08/29/2015  CLINICAL DATA:  60 year old female with 2-3 day history of vomiting. Abdominal pain for the past month. Loss of voice. EXAM: CT CHEST, ABDOMEN, AND PELVIS WITH CONTRAST TECHNIQUE: Multidetector CT imaging of the chest, abdomen and pelvis was performed following the standard protocol during bolus administration of intravenous  contrast. CONTRAST:  69mL OMNIPAQUE IOHEXOL 300 MG/ML  SOLN COMPARISON:  No priors. FINDINGS: CT CHEST FINDINGS Mediastinum/Lymph Nodes: Heart size is normal. There is no significant pericardial fluid, thickening or pericardial calcification. There is atherosclerosis of the thoracic aorta, the great vessels of the mediastinum and the coronary arteries, including calcified atherosclerotic plaque in the left main, left anterior descending, left circumflex and right coronary arteries. Calcifications of the aortic valve and mitral valve/annulus. No pathologically enlarged mediastinal or hilar lymph nodes. Esophagus is unremarkable in appearance. No axillary lymphadenopathy. Lungs/Pleura: 4 mm subpleural nodule in the lateral segment of the right middle lobe (image 31 of series 4). 4 mm subpleural nodule in the posterior left lower lobe (image 38 of series 4). No other suspicious appearing pulmonary nodules or masses. No acute consolidative airspace disease. No pleural effusions. Musculoskeletal/Soft Tissues: There are no aggressive appearing lytic or blastic lesions noted in the visualized portions of the skeleton. CT ABDOMEN AND PELVIS FINDINGS Hepatobiliary: No cystic or solid hepatic lesions. No intra or extrahepatic biliary ductal dilatation.  7 mm calcified gallstone lying dependently in the gallbladder. No current findings to suggest an acute cholecystitis at this time. Pancreas: No pancreatic mass. No pancreatic ductal dilatation. No pancreatic or peripancreatic fluid or inflammatory changes. Spleen: Unremarkable. Adrenals/Urinary Tract: In the lower pole of the left kidney there is a 5.9 x 6.1 x 6.3 cm heterogeneously enhancing lesion highly concerning for renal cell carcinoma. The inferior aspect of this lesion comes in very close proximity to the anterior aspect of the left psoas muscle and quadratus lumborum muscle, however, there appears to be an intervening fat plane at this time. The lesion is well separated from the left renal vein. Right kidney and right adrenal gland are normal in appearance. 1.2 x 1.4 cm indeterminate nodule in the lateral limb of the left adrenal gland. No hydroureteronephrosis. Urinary bladder is largely decompressed, but otherwise unremarkable in appearance. Stomach/Bowel: Normal appearance of the stomach. No pathologic dilatation of small bowel or colon. A few scattered colonic diverticulae are noted, without surrounding inflammatory changes to suggest an acute diverticulitis at this time. Appendix is normal. Vascular/Lymphatic: Atherosclerosis throughout the abdominal and pelvic vasculature, without evidence of aneurysm or dissection. Left renal vein is widely patent and separate from the lower pole mass. No lymphadenopathy noted in the abdomen or pelvis. Reproductive: Uterus and ovaries are unremarkable in appearance. Other: No significant volume of ascites.  No pneumoperitoneum. Musculoskeletal: There are no aggressive appearing lytic or blastic lesions noted in the visualized portions of the skeleton. IMPRESSION: 1. No acute findings in the abdomen or pelvis to account for the patient's symptoms. 2. However, there is a 5.9 x 6.1 x 6.3 cm heterogeneously enhancing mass in the lower pole of the left kidney highly  concerning for renal cell carcinoma. At this time, the lesion appears likely to be encapsulated within Gerota's fascia (although it comes very close to the left psoas and quadratus lumborum musculature), does not involve the left renal vein, and does not appear to be associated with lymphadenopathy. Nonemergent Urologic consultation for surgical resection is strongly recommended in the near future. 3. 1.2 x 1.4 cm indeterminate nodule in the left adrenal gland. Attention on followup studies is recommended, as a metastatic lesion is not excluded. 4. 4 mm subpleural nodules in the right middle lobe and left lower lobe. These are highly nonspecific, and favored to represent subpleural lymph nodes, but attention on followup studies is recommended to ensure stability. 5. Cholelithiasis without evidence  of acute cholecystitis at this time. 6. Atherosclerosis, including left main and 3 vessel coronary artery disease. Please note that although the presence of coronary artery calcium documents the presence of coronary artery disease, the severity of this disease and any potential stenosis cannot be assessed on this non-gated CT examination. Assessment for potential risk factor modification, dietary therapy or pharmacologic therapy may be warranted, if clinically indicated. 7. There are calcifications of the aortic valve and mitral valve/annulus. Echocardiographic correlation for evaluation of potential valvular dysfunction may be warranted if clinically indicated. 8. Colonic diverticulosis without evidence of acute diverticulitis at this time. 9. Additional incidental findings, as above. These results were called by telephone at the time of interpretation on 08/29/2015 at 7:34 pm to Dr. Davonna Belling, who verbally acknowledged these results. Electronically Signed   By: Vinnie Langton M.D.   On: 08/29/2015 19:37   Ct Abdomen Pelvis W Contrast  08/29/2015  CLINICAL DATA:  60 year old female with 2-3 day history of  vomiting. Abdominal pain for the past month. Loss of voice. EXAM: CT CHEST, ABDOMEN, AND PELVIS WITH CONTRAST TECHNIQUE: Multidetector CT imaging of the chest, abdomen and pelvis was performed following the standard protocol during bolus administration of intravenous contrast. CONTRAST:  24mL OMNIPAQUE IOHEXOL 300 MG/ML  SOLN COMPARISON:  No priors. FINDINGS: CT CHEST FINDINGS Mediastinum/Lymph Nodes: Heart size is normal. There is no significant pericardial fluid, thickening or pericardial calcification. There is atherosclerosis of the thoracic aorta, the great vessels of the mediastinum and the coronary arteries, including calcified atherosclerotic plaque in the left main, left anterior descending, left circumflex and right coronary arteries. Calcifications of the aortic valve and mitral valve/annulus. No pathologically enlarged mediastinal or hilar lymph nodes. Esophagus is unremarkable in appearance. No axillary lymphadenopathy. Lungs/Pleura: 4 mm subpleural nodule in the lateral segment of the right middle lobe (image 31 of series 4). 4 mm subpleural nodule in the posterior left lower lobe (image 38 of series 4). No other suspicious appearing pulmonary nodules or masses. No acute consolidative airspace disease. No pleural effusions. Musculoskeletal/Soft Tissues: There are no aggressive appearing lytic or blastic lesions noted in the visualized portions of the skeleton. CT ABDOMEN AND PELVIS FINDINGS Hepatobiliary: No cystic or solid hepatic lesions. No intra or extrahepatic biliary ductal dilatation. 7 mm calcified gallstone lying dependently in the gallbladder. No current findings to suggest an acute cholecystitis at this time. Pancreas: No pancreatic mass. No pancreatic ductal dilatation. No pancreatic or peripancreatic fluid or inflammatory changes. Spleen: Unremarkable. Adrenals/Urinary Tract: In the lower pole of the left kidney there is a 5.9 x 6.1 x 6.3 cm heterogeneously enhancing lesion highly  concerning for renal cell carcinoma. The inferior aspect of this lesion comes in very close proximity to the anterior aspect of the left psoas muscle and quadratus lumborum muscle, however, there appears to be an intervening fat plane at this time. The lesion is well separated from the left renal vein. Right kidney and right adrenal gland are normal in appearance. 1.2 x 1.4 cm indeterminate nodule in the lateral limb of the left adrenal gland. No hydroureteronephrosis. Urinary bladder is largely decompressed, but otherwise unremarkable in appearance. Stomach/Bowel: Normal appearance of the stomach. No pathologic dilatation of small bowel or colon. A few scattered colonic diverticulae are noted, without surrounding inflammatory changes to suggest an acute diverticulitis at this time. Appendix is normal. Vascular/Lymphatic: Atherosclerosis throughout the abdominal and pelvic vasculature, without evidence of aneurysm or dissection. Left renal vein is widely patent and separate from the lower  pole mass. No lymphadenopathy noted in the abdomen or pelvis. Reproductive: Uterus and ovaries are unremarkable in appearance. Other: No significant volume of ascites.  No pneumoperitoneum. Musculoskeletal: There are no aggressive appearing lytic or blastic lesions noted in the visualized portions of the skeleton. IMPRESSION: 1. No acute findings in the abdomen or pelvis to account for the patient's symptoms. 2. However, there is a 5.9 x 6.1 x 6.3 cm heterogeneously enhancing mass in the lower pole of the left kidney highly concerning for renal cell carcinoma. At this time, the lesion appears likely to be encapsulated within Gerota's fascia (although it comes very close to the left psoas and quadratus lumborum musculature), does not involve the left renal vein, and does not appear to be associated with lymphadenopathy. Nonemergent Urologic consultation for surgical resection is strongly recommended in the near future. 3. 1.2 x 1.4  cm indeterminate nodule in the left adrenal gland. Attention on followup studies is recommended, as a metastatic lesion is not excluded. 4. 4 mm subpleural nodules in the right middle lobe and left lower lobe. These are highly nonspecific, and favored to represent subpleural lymph nodes, but attention on followup studies is recommended to ensure stability. 5. Cholelithiasis without evidence of acute cholecystitis at this time. 6. Atherosclerosis, including left main and 3 vessel coronary artery disease. Please note that although the presence of coronary artery calcium documents the presence of coronary artery disease, the severity of this disease and any potential stenosis cannot be assessed on this non-gated CT examination. Assessment for potential risk factor modification, dietary therapy or pharmacologic therapy may be warranted, if clinically indicated. 7. There are calcifications of the aortic valve and mitral valve/annulus. Echocardiographic correlation for evaluation of potential valvular dysfunction may be warranted if clinically indicated. 8. Colonic diverticulosis without evidence of acute diverticulitis at this time. 9. Additional incidental findings, as above. These results were called by telephone at the time of interpretation on 08/29/2015 at 7:34 pm to Dr. Davonna Belling, who verbally acknowledged these results. Electronically Signed   By: Vinnie Langton M.D.   On: 08/29/2015 19:37   Dg Swallowing Func-speech Pathology  09/03/2015  Objective Swallowing Evaluation:   Patient Details Name: Ashley Ramsey MRN: [id number] Date of Birth: 05-26-1956 Today's Date: 09/03/2015 Time: SLP Start Time (ACUTE ONLY): 1124-SLP Stop Time (ACUTE ONLY): 1143 SLP Time Calculation (min) (ACUTE ONLY): 19 min Past Medical History: Past Medical History Diagnosis Date . Thyroid disease  . Hypertension  . Neck mass hospitalized 08/29/2015 . Type II diabetes mellitus ([place])  . Left kidney mass  . Anemia, chronic disease  Past  Surgical History: Past Surgical History Procedure Laterality Date . No past surgeries   HPI: Pt is 60 y.o. female with h/o hypertension and NIDDM. Pt presented to ED with voice changes, difficult swallowing and abdominal discomfort that have progressed over the last month. 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 Subjective: pt alert, eager for food/drink Assessment / Plan / Recommendation CHL IP CLINICAL IMPRESSIONS 09/03/2015 Therapy Diagnosis Severe pharyngeal phase dysphagia Clinical Impression Pt has a severe pharyngeal dysphaghia secondary to presence of supraglottic mass, which impedes bolus flow and decreased airway closure. All consistencies enter the airway during the swallow even with tsp-sized boluses. Aspiration is usually sensed, but she is not able to expel aspirates from her trachea. A chin tuck paired with an immediate cough facilitates clearance through the pharynx and clearance of penetrates with all consistencies tested; however, all  liquids are then re-penetrated and aspirated after the swallow. Purees do not re-enter the airway, although aspiration risk remains due to residue. Recommend Dys 1 diet and pudding thick liquids with a chin tuck and immediate cough following all bites. Impact on safety and function Moderate aspiration risk;Risk for inadequate nutrition/hydration   CHL IP TREATMENT RECOMMENDATION 09/03/2015 Treatment Recommendations Therapy as outlined in treatment plan below   Prognosis 09/03/2015 Prognosis for Safe Diet Advancement Guarded Barriers to Reach Goals -- Barriers/Prognosis Comment -- CHL IP DIET RECOMMENDATION 09/03/2015 SLP Diet Recommendations Dysphagia 1 (Puree) solids;Pudding thick liquid Liquid Administration via Spoon Medication Administration Crushed with puree Compensations Slow rate;Small sips/bites;Chin tuck;Hard cough after swallow Postural Changes Remain semi-upright after after feeds/meals (Comment);Seated  upright at 90 degrees   CHL IP OTHER RECOMMENDATIONS 09/03/2015 Recommended Consults -- Oral Care Recommendations Oral care BID Other Recommendations Order thickener from pharmacy;Prohibited food (jello, ice cream, thin soups);Remove water pitcher   CHL IP FOLLOW UP RECOMMENDATIONS 09/03/2015 Follow up Recommendations Outpatient SLP;24 hour supervision/assistance   CHL IP FREQUENCY AND DURATION 09/03/2015 Speech Therapy Frequency (ACUTE ONLY) min 2x/week Treatment Duration 2 weeks      CHL IP ORAL PHASE 09/03/2015 Oral Phase WFL Oral - Pudding Teaspoon -- Oral - Pudding Cup -- Oral - Honey Teaspoon -- Oral - Honey Cup -- Oral - Nectar Teaspoon -- Oral - Nectar Cup -- Oral - Nectar Straw -- Oral - Thin Teaspoon -- Oral - Thin Cup -- Oral - Thin Straw -- Oral - Puree -- Oral - Mech Soft -- Oral - Regular -- Oral - Multi-Consistency -- Oral - Pill -- Oral Phase - Comment --  CHL IP PHARYNGEAL PHASE 09/03/2015 Pharyngeal Phase Impaired Pharyngeal- Pudding Teaspoon -- Pharyngeal -- Pharyngeal- Pudding Cup -- Pharyngeal -- Pharyngeal- Honey Teaspoon Reduced airway/laryngeal closure;Penetration/Aspiration during swallow;Penetration/Apiration after swallow;Pharyngeal residue - pyriform;Pharyngeal residue - posterior pharnyx;Compensatory strategies attempted (with notebox) Pharyngeal Material enters airway, passes BELOW cords and not ejected out despite cough attempt by patient Pharyngeal- Honey Cup -- Pharyngeal -- Pharyngeal- Nectar Teaspoon Reduced airway/laryngeal closure;Penetration/Aspiration during swallow;Penetration/Apiration after swallow;Pharyngeal residue - pyriform;Pharyngeal residue - posterior pharnyx;Compensatory strategies attempted (with notebox) Pharyngeal Material enters airway, passes BELOW cords and not ejected out despite cough attempt by patient Pharyngeal- Nectar Cup -- Pharyngeal -- Pharyngeal- Nectar Straw -- Pharyngeal -- Pharyngeal- Thin Teaspoon Reduced airway/laryngeal closure;Penetration/Aspiration  during swallow;Penetration/Apiration after swallow;Pharyngeal residue - pyriform;Pharyngeal residue - posterior pharnyx;Compensatory strategies attempted (with notebox) Pharyngeal Material enters airway, passes BELOW cords and not ejected out despite cough attempt by patient Pharyngeal- Thin Cup -- Pharyngeal -- Pharyngeal- Thin Straw Reduced airway/laryngeal closure;Penetration/Aspiration during swallow;Penetration/Apiration after swallow;Pharyngeal residue - pyriform;Pharyngeal residue - posterior pharnyx;Compensatory strategies attempted (with notebox) Pharyngeal Material enters airway, passes BELOW cords and not ejected out despite cough attempt by patient Pharyngeal- Puree Reduced airway/laryngeal closure;Penetration/Aspiration during swallow;Compensatory strategies attempted (with notebox);Pharyngeal residue - posterior pharnyx Pharyngeal Material enters airway, remains ABOVE vocal cords and not ejected out Pharyngeal- Mechanical Soft -- Pharyngeal -- Pharyngeal- Regular -- Pharyngeal -- Pharyngeal- Multi-consistency -- Pharyngeal -- Pharyngeal- Pill -- Pharyngeal -- Pharyngeal Comment --  CHL IP CERVICAL ESOPHAGEAL PHASE 09/03/2015 Cervical Esophageal Phase (No Data) Pudding Teaspoon -- Pudding Cup -- Honey Teaspoon -- Honey Cup -- Nectar Teaspoon -- Nectar Cup -- Nectar Straw -- Thin Teaspoon -- Thin Cup -- Thin Straw -- Puree -- Mechanical Soft -- Regular -- Multi-consistency -- Pill -- Cervical Esophageal Comment -- No flowsheet data found. Germain Osgood, M.A. CCC-SLP [phone] Germain Osgood 09/03/2015, 1:33 PM  Nena Alexander   [facility] Pager:[phone]  If 7PM-7AM, please contact night-coverage [email or web address] Password [email or web address] 09/03/2015, 2:19 PM   LOS: 4 days

## 2015-09-03 NOTE — Progress Notes (Signed)
Pt c/o sneezing, coughing, runny nose & watery eyes that happened all of a sudden. Made Dr Sloan Leiter aware. Will wait to hear from dr for further orders.

## 2015-09-03 NOTE — Progress Notes (Signed)
MBSS complete. Full report located under chart review in imaging section.  Gean Laursen Paiewonsky, M.A. CCC-SLP [phone]  

## 2015-09-04 ENCOUNTER — Inpatient Hospital Stay (HOSPITAL_COMMUNITY): Payer: Medicaid Other | Admitting: Certified Registered Nurse Anesthetist

## 2015-09-04 ENCOUNTER — Encounter (HOSPITAL_COMMUNITY): Payer: Self-pay | Admitting: Certified Registered Nurse Anesthetist

## 2015-09-04 ENCOUNTER — Inpatient Hospital Stay (HOSPITAL_COMMUNITY): Payer: Medicaid Other

## 2015-09-04 ENCOUNTER — Encounter (HOSPITAL_COMMUNITY)
Admission: EM | Disposition: A | Discharge status: Home Health | Payer: Self-pay | Source: Home / Self Care | Attending: Internal Medicine

## 2015-09-04 ENCOUNTER — Ambulatory Visit: Payer: Self-pay | Admitting: Otolaryngology

## 2015-09-04 DIAGNOSIS — C649 Malignant neoplasm of unspecified kidney, except renal pelvis: Secondary | ICD-10-CM

## 2015-09-04 DIAGNOSIS — I10 Essential (primary) hypertension: Secondary | ICD-10-CM

## 2015-09-04 DIAGNOSIS — C329 Malignant neoplasm of larynx, unspecified: Secondary | ICD-10-CM

## 2015-09-04 DIAGNOSIS — K264 Chronic or unspecified duodenal ulcer with hemorrhage: Secondary | ICD-10-CM

## 2015-09-04 DIAGNOSIS — C321 Malignant neoplasm of supraglottis: Principal | ICD-10-CM

## 2015-09-04 DIAGNOSIS — K922 Gastrointestinal hemorrhage, unspecified: Secondary | ICD-10-CM | POA: Insufficient documentation

## 2015-09-04 DIAGNOSIS — E119 Type 2 diabetes mellitus without complications: Secondary | ICD-10-CM

## 2015-09-04 HISTORY — PX: DIRECT LARYNGOSCOPY: SHX5326

## 2015-09-04 HISTORY — PX: TRACHEOSTOMY TUBE PLACEMENT: SHX814

## 2015-09-04 HISTORY — PX: ESOPHAGOGASTRODUODENOSCOPY: SHX5428

## 2015-09-04 LAB — BASIC METABOLIC PANEL
ANION GAP: 7 (ref 5–15)
BUN: 17 mg/dL (ref 6–20)
CHLORIDE: 116 mmol/L — AB (ref 101–111)
CO2: 23 mmol/L (ref 22–32)
Calcium: 7.9 mg/dL — ABNORMAL LOW (ref 8.9–10.3)
Creatinine, Ser: 0.89 mg/dL (ref 0.44–1.00)
Glucose, Bld: 216 mg/dL — ABNORMAL HIGH (ref 65–99)
POTASSIUM: 3.7 mmol/L (ref 3.5–5.1)
SODIUM: 146 mmol/L — AB (ref 135–145)

## 2015-09-04 LAB — POCT I-STAT 7, (LYTES, BLD GAS, ICA,H+H)
Acid-base deficit: 5 mmol/L — ABNORMAL HIGH (ref 0.0–2.0)
BICARBONATE: 19.4 meq/L — AB (ref 20.0–24.0)
Calcium, Ion: 1.12 mmol/L (ref 1.12–1.23)
HCT: 15 % — ABNORMAL LOW (ref 36.0–46.0)
Hemoglobin: 5.1 g/dL — CL (ref 12.0–15.0)
O2 SAT: 94 %
PCO2 ART: 32.9 mmHg — AB (ref 35.0–45.0)
PO2 ART: 69 mmHg — AB (ref 80.0–100.0)
POTASSIUM: 3.5 mmol/L (ref 3.5–5.1)
Sodium: 146 mmol/L — ABNORMAL HIGH (ref 135–145)
TCO2: 20 mmol/L (ref 0–100)
pH, Arterial: 7.378 (ref 7.350–7.450)

## 2015-09-04 LAB — CBC WITH DIFFERENTIAL/PLATELET
BASOS ABS: 0 10*3/uL (ref 0.0–0.1)
BASOS PCT: 0 %
Eosinophils Absolute: 0.1 10*3/uL (ref 0.0–0.7)
Eosinophils Relative: 0 %
HEMATOCRIT: 20.7 % — AB (ref 36.0–46.0)
Hemoglobin: 6.6 g/dL — CL (ref 12.0–15.0)
Lymphocytes Relative: 7 %
Lymphs Abs: 1.3 10*3/uL (ref 0.7–4.0)
MCH: 27.6 pg (ref 26.0–34.0)
MCHC: 31.9 g/dL (ref 30.0–36.0)
MCV: 86.6 fL (ref 78.0–100.0)
MONO ABS: 0.7 10*3/uL (ref 0.1–1.0)
MONOS PCT: 4 %
Neutro Abs: 16.1 10*3/uL — ABNORMAL HIGH (ref 1.7–7.7)
Neutrophils Relative %: 89 %
PLATELETS: 196 10*3/uL (ref 150–400)
RBC: 2.39 MIL/uL — ABNORMAL LOW (ref 3.87–5.11)
RDW: 15.4 % (ref 11.5–15.5)
WBC: 18.1 10*3/uL — AB (ref 4.0–10.5)

## 2015-09-04 LAB — GLUCOSE, CAPILLARY
GLUCOSE-CAPILLARY: 184 mg/dL — AB (ref 65–99)
GLUCOSE-CAPILLARY: 145 mg/dL — AB (ref 65–99)
Glucose-Capillary: 133 mg/dL — ABNORMAL HIGH (ref 65–99)

## 2015-09-04 LAB — HEMOGLOBIN AND HEMATOCRIT, BLOOD
HCT: 29.4 % — ABNORMAL LOW (ref 36.0–46.0)
Hemoglobin: 10.1 g/dL — ABNORMAL LOW (ref 12.0–15.0)

## 2015-09-04 LAB — MRSA PCR SCREENING: MRSA by PCR: NEGATIVE

## 2015-09-04 LAB — CBC
HEMATOCRIT: 16.8 % — AB (ref 36.0–46.0)
HEMOGLOBIN: 5.6 g/dL — AB (ref 12.0–15.0)
MCH: 29.2 pg (ref 26.0–34.0)
MCHC: 33.3 g/dL (ref 30.0–36.0)
MCV: 87.5 fL (ref 78.0–100.0)
Platelets: 140 10*3/uL — ABNORMAL LOW (ref 150–400)
RBC: 1.92 MIL/uL — ABNORMAL LOW (ref 3.87–5.11)
RDW: 14.8 % (ref 11.5–15.5)
WBC: 18.5 10*3/uL — ABNORMAL HIGH (ref 4.0–10.5)

## 2015-09-04 LAB — PROTIME-INR
INR: 1.34 (ref 0.00–1.49)
PROTHROMBIN TIME: 16.7 s — AB (ref 11.6–15.2)

## 2015-09-04 LAB — PREPARE RBC (CROSSMATCH)

## 2015-09-04 LAB — APTT: APTT: 26 s (ref 24–37)

## 2015-09-04 SURGERY — CREATION, TRACHEOSTOMY
Anesthesia: General | Site: Throat

## 2015-09-04 SURGERY — EGD (ESOPHAGOGASTRODUODENOSCOPY)
Anesthesia: Moderate Sedation

## 2015-09-04 MED ORDER — ONDANSETRON HCL 4 MG/2ML IJ SOLN
INTRAMUSCULAR | Status: AC
  Filled 2015-09-04: qty 2

## 2015-09-04 MED ORDER — FENTANYL CITRATE (PF) 250 MCG/5ML IJ SOLN
INTRAMUSCULAR | Status: AC
  Filled 2015-09-04: qty 5

## 2015-09-04 MED ORDER — FUROSEMIDE 10 MG/ML IJ SOLN: 20.0000 mg | Freq: Once | INTRAMUSCULAR | Status: DC

## 2015-09-04 MED ORDER — MEPERIDINE HCL 25 MG/ML IJ SOLN: 6.2500 mg | INTRAMUSCULAR | Status: DC | PRN

## 2015-09-04 MED ORDER — PROPOFOL 10 MG/ML IV BOLUS
INTRAVENOUS | Status: AC
  Filled 2015-09-04: qty 20

## 2015-09-04 MED ORDER — FUROSEMIDE 10 MG/ML IJ SOLN: 10.0000 mg | INTRAMUSCULAR | Status: DC

## 2015-09-04 MED ORDER — ARTIFICIAL TEARS OP OINT
TOPICAL_OINTMENT | OPHTHALMIC | Status: DC | PRN
  Administered 2015-09-04: 1 via OPHTHALMIC

## 2015-09-04 MED ORDER — SODIUM CHLORIDE 0.9 % IV SOLN
8.0000 mg/h | INTRAVENOUS | Status: AC
  Administered 2015-09-04 – 2015-09-07 (×6): 8 mg/h via INTRAVENOUS
  Filled 2015-09-04 (×12): qty 80

## 2015-09-04 MED ORDER — MIDAZOLAM HCL 10 MG/2ML IJ SOLN
INTRAMUSCULAR | Status: DC | PRN
  Administered 2015-09-04: 1 mg via INTRAVENOUS
  Administered 2015-09-04: 2 mg via INTRAVENOUS

## 2015-09-04 MED ORDER — PANTOPRAZOLE SODIUM 40 MG IV SOLR
40.0000 mg | Freq: Two times a day (BID) | INTRAVENOUS | Status: DC
  Filled 2015-09-04 (×2): qty 40

## 2015-09-04 MED ORDER — SODIUM CHLORIDE 0.9 % IV SOLN: Freq: Once | INTRAVENOUS | Status: DC

## 2015-09-04 MED ORDER — SODIUM CHLORIDE 0.9 % IV SOLN
10.0000 mL/h | Freq: Once | INTRAVENOUS | Status: DC
Start: 2015-09-04 — End: 2015-09-04

## 2015-09-04 MED ORDER — SODIUM CHLORIDE 0.45 % IV SOLN: INTRAVENOUS | Status: DC

## 2015-09-04 MED ORDER — ROCURONIUM BROMIDE 50 MG/5ML IV SOLN
INTRAVENOUS | Status: AC
  Filled 2015-09-04: qty 1

## 2015-09-04 MED ORDER — PROPOFOL 10 MG/ML IV BOLUS
INTRAVENOUS | Status: DC | PRN
  Administered 2015-09-04: 50 mg via INTRAVENOUS

## 2015-09-04 MED ORDER — LIDOCAINE HCL (CARDIAC) 20 MG/ML IV SOLN
INTRAVENOUS | Status: AC
  Filled 2015-09-04: qty 5

## 2015-09-04 MED ORDER — LIDOCAINE HCL (CARDIAC) 20 MG/ML IV SOLN
INTRAVENOUS | Status: DC | PRN
  Administered 2015-09-04: 50 mg via INTRAVENOUS

## 2015-09-04 MED ORDER — ROCURONIUM BROMIDE 100 MG/10ML IV SOLN
INTRAVENOUS | Status: DC | PRN
  Administered 2015-09-04: 30 mg via INTRAVENOUS

## 2015-09-04 MED ORDER — ONDANSETRON HCL 4 MG/2ML IJ SOLN
4.0000 mg | Freq: Once | INTRAMUSCULAR | Status: AC
  Administered 2015-09-04: 4 mg via INTRAVENOUS
  Filled 2015-09-04: qty 2

## 2015-09-04 MED ORDER — SUCCINYLCHOLINE CHLORIDE 20 MG/ML IJ SOLN
INTRAMUSCULAR | Status: DC | PRN
  Administered 2015-09-04: 80 mg via INTRAVENOUS

## 2015-09-04 MED ORDER — PANTOPRAZOLE SODIUM 40 MG IV SOLR
80.0000 mg | Freq: Once | INTRAVENOUS | Status: AC
  Administered 2015-09-04: 80 mg via INTRAVENOUS
  Filled 2015-09-04: qty 80

## 2015-09-04 MED ORDER — 0.9 % SODIUM CHLORIDE (POUR BTL) OPTIME
TOPICAL | Status: DC | PRN
  Administered 2015-09-04: 1000 mL

## 2015-09-04 MED ORDER — GLYCOPYRROLATE 0.2 MG/ML IJ SOLN
INTRAMUSCULAR | Status: DC | PRN
  Administered 2015-09-04: 0.6 mg via INTRAVENOUS

## 2015-09-04 MED ORDER — MIDAZOLAM HCL 5 MG/ML IJ SOLN
INTRAMUSCULAR | Status: AC
  Filled 2015-09-04: qty 2

## 2015-09-04 MED ORDER — MIDAZOLAM HCL 2 MG/2ML IJ SOLN
INTRAMUSCULAR | Status: AC
  Filled 2015-09-04: qty 2

## 2015-09-04 MED ORDER — DEXTROSE 5 % IV SOLN
INTRAVENOUS | Status: DC
  Administered 2015-09-04: 21:00:00 via INTRAVENOUS

## 2015-09-04 MED ORDER — ONDANSETRON HCL 4 MG/2ML IJ SOLN
INTRAMUSCULAR | Status: DC | PRN
  Administered 2015-09-04: 4 mg via INTRAVENOUS

## 2015-09-04 MED ORDER — NEOSTIGMINE METHYLSULFATE 10 MG/10ML IV SOLN
INTRAVENOUS | Status: DC | PRN
  Administered 2015-09-04: 4 mg via INTRAVENOUS

## 2015-09-04 MED ORDER — FENTANYL CITRATE (PF) 100 MCG/2ML IJ SOLN
INTRAMUSCULAR | Status: DC | PRN
  Administered 2015-09-04: 100 ug via INTRAVENOUS

## 2015-09-04 MED ORDER — FENTANYL CITRATE (PF) 100 MCG/2ML IJ SOLN
INTRAMUSCULAR | Status: DC | PRN
  Administered 2015-09-04: 25 ug via INTRAVENOUS

## 2015-09-04 MED ORDER — FENTANYL CITRATE (PF) 100 MCG/2ML IJ SOLN
INTRAMUSCULAR | Status: AC
  Filled 2015-09-04: qty 2

## 2015-09-04 MED ORDER — PROMETHAZINE HCL 25 MG/ML IJ SOLN: 6.2500 mg | INTRAMUSCULAR | Status: DC | PRN

## 2015-09-04 MED ORDER — FENTANYL CITRATE (PF) 100 MCG/2ML IJ SOLN: 25.0000 ug | INTRAMUSCULAR | Status: DC | PRN

## 2015-09-04 MED ORDER — FAMOTIDINE IN NACL 20-0.9 MG/50ML-% IV SOLN
20.0000 mg | Freq: Once | INTRAVENOUS | Status: AC
  Administered 2015-09-04: 20 mg via INTRAVENOUS
  Filled 2015-09-04: qty 50

## 2015-09-04 MED ORDER — LACTATED RINGERS IV SOLN
INTRAVENOUS | Status: DC
  Administered 2015-09-04 (×2): via INTRAVENOUS

## 2015-09-04 SURGICAL SUPPLY — 37 items
APL SKNCLS STERI-STRIP NONHPOA (GAUZE/BANDAGES/DRESSINGS)
BENZOIN TINCTURE PRP APPL 2/3 (GAUZE/BANDAGES/DRESSINGS) IMPLANT
BLADE SURG 15 STRL LF DISP TIS (BLADE) IMPLANT
BLADE SURG 15 STRL SS (BLADE)
BLADE SURG ROTATE 9660 (MISCELLANEOUS) IMPLANT
CANISTER SUCTION 2500CC (MISCELLANEOUS) ×4 IMPLANT
CLEANER TIP ELECTROSURG 2X2 (MISCELLANEOUS) ×4 IMPLANT
COVER SURGICAL LIGHT HANDLE (MISCELLANEOUS) ×4 IMPLANT
DECANTER SPIKE VIAL GLASS SM (MISCELLANEOUS) ×4 IMPLANT
DRAPE PROXIMA HALF (DRAPES) IMPLANT
ELECT COATED BLADE 2.86 ST (ELECTRODE) ×4 IMPLANT
ELECT REM PT RETURN 9FT ADLT (ELECTROSURGICAL) ×4
ELECTRODE REM PT RTRN 9FT ADLT (ELECTROSURGICAL) ×2 IMPLANT
GAUZE SPONGE 4X4 16PLY XRAY LF (GAUZE/BANDAGES/DRESSINGS) ×4 IMPLANT
GLOVE ECLIPSE 7.5 STRL STRAW (GLOVE) ×4 IMPLANT
GOWN STRL REUS W/ TWL LRG LVL3 (GOWN DISPOSABLE) ×4 IMPLANT
GOWN STRL REUS W/TWL LRG LVL3 (GOWN DISPOSABLE) ×8
HOLDER TRACH TUBE VELCRO 19.5 (MISCELLANEOUS) ×2 IMPLANT
KIT BASIN OR (CUSTOM PROCEDURE TRAY) ×4 IMPLANT
KIT ROOM TURNOVER OR (KITS) ×4 IMPLANT
NEEDLE 27GAX1X1/2 (NEEDLE) ×4 IMPLANT
NS IRRIG 1000ML POUR BTL (IV SOLUTION) ×4 IMPLANT
PACK EENT II TURBAN DRAPE (CUSTOM PROCEDURE TRAY) ×4 IMPLANT
PAD ARMBOARD 7.5X6 YLW CONV (MISCELLANEOUS) ×8 IMPLANT
PENCIL FOOT CONTROL (ELECTRODE) ×6 IMPLANT
SPONGE DRAIN TRACH 4X4 STRL 2S (GAUZE/BANDAGES/DRESSINGS) ×2 IMPLANT
SUT CHROMIC 2 0 SH (SUTURE) ×4 IMPLANT
SUT ETHILON 3 0 PS 1 (SUTURE) ×4 IMPLANT
SUT SILK 4 0 TIE 10X30 (SUTURE) ×4 IMPLANT
SUT SILK 4 0 TIES 17X18 (SUTURE) ×4 IMPLANT
SYR 20CC LL (SYRINGE) ×4 IMPLANT
SYR CONTROL 10ML LL (SYRINGE) IMPLANT
TOWEL OR 17X24 6PK STRL BLUE (TOWEL DISPOSABLE) ×4 IMPLANT
TUBE CONNECTING 12'X1/4 (SUCTIONS) ×1
TUBE CONNECTING 12X1/4 (SUCTIONS) ×3 IMPLANT
TUBE TRACH SHILEY 8 DIST CUF (TUBING) ×2 IMPLANT
WATER STERILE IRR 1000ML POUR (IV SOLUTION) ×4 IMPLANT

## 2015-09-04 NOTE — Anesthesia Procedure Notes (Addendum)
Procedure Name: Intubation Performed by: Lowella Dell Pre-anesthesia Checklist: Patient identified, Emergency Drugs available, Suction available, Patient being monitored and Timeout performed Patient Re-evaluated:Patient Re-evaluated prior to inductionOxygen Delivery Method: Circle system utilized Preoxygenation: Pre-oxygenation with 100% oxygen Intubation Type: Combination inhalational/ intravenous induction Ventilation: Mask ventilation without difficulty Laryngoscope Size: Mac and 3 Grade View: Grade I Tube type: Oral Tube size: 7.0 mm Number of attempts: 1 Airway Equipment and Method: Video-laryngoscopy Placement Confirmation: ETT inserted through vocal cords under direct vision,  positive ETCO2 and breath sounds checked- equal and bilateral Secured at: 23 cm Tube secured with: Tape Difficulty Due To: Difficulty was anticipated Comments: Pt with large supraglottic mass. Airway anatomy distorted and mass fungating over glottic opening. Very friable and bleeding easily stirred with gentle suction. ETT passed easily through cords.   Central Venous Catheter Insertion Performed by: anesthesiologist Patient location: Pre-op. Preanesthetic checklist: patient identified, IV checked, site marked, risks and benefits discussed, surgical consent, monitors and equipment checked, pre-op evaluation, timeout performed and anesthesia consent Position: Trendelenburg Lidocaine 1% used for infiltration Landmarks identified Catheter size: 8 Fr Central line was placed.Double lumen Procedure performed using ultrasound guided technique. Attempts: 1 Following insertion, dressing applied, line sutured and Biopatch. Post procedure assessment: blood return through all ports. Patient tolerated the procedure well with no immediate complications.

## 2015-09-04 NOTE — Progress Notes (Signed)
CRITICAL VALUE ALERT  Critical value received:  HB 6.6  Date of notification: 09/03/15  Time of notification:  [id number]  Critical value read back:Yes.    Nurse who received alert:  Sabita  MD notified (1st page): yes  Time of first page: 0407  MD notified (2nd page): not yet  Time of second page:not yet  Responding MD: Threasa Alpha  Time MD responded: [phone]

## 2015-09-04 NOTE — Progress Notes (Signed)
Report Called to charge nurse given. All questions answered. 1st unit of blood started. Pt being transported to OR in stable condition. VSS.

## 2015-09-04 NOTE — Op Note (Signed)
09/04/2015 1:35 PM   PATIENT:  Ashley Ramsey, 60 y.o. female  PRE-OPERATIVE DIAGNOSIS:  trach  POST-OPERATIVE DIAGNOSIS:  trach   PROCEDURE:  Procedure(s): TRACHEOSTOMY  SURGEON:  Surgeon(s): Beckie Salts, MD  ASSISTANTS: none   ANESTHESIA:   general  EBL: Minimal   DRAINS: none   LOCAL MEDICATIONS USED:  NONE  COUNTS CORRECT:  YES  PROCEDURE DETAILS: Patient was taken to the operating room and placed on the operating table in the supine position. A shoulder roll was placed for positioning. The patient was previously orally intubated. The neck was prepped and draped in a standard fashion. A vertical incision was created just above the sternal notch using electrocautery. The midline fascia was divided. The isthmus of the thyroid was reflected superiorly and the upper trachea was exposed. A tracheotomy was created between the first and second tracheal rings in a horizontal fashion. A lower tracheal flap was created with scissors and the flap was sutured to the cervical skin using 2-0 chromic suture. The orotracheal tube was removed. The #8 Shiley tracheostomy tube was placed without difficulty and the cuff was inflated. The shield was secured to the neck using a Velcro straps and nylon suture.   All suctioned clear and a mediastinoscopy suction cautery was used to cauterize several places that were oozing. This seemed to control the bleeding.  The patient was then transferred to the intensive care unit in stable condition.  PLAN OF CARE: Transfer to ICU  PATIENT DISPOSITION:  ICU - hemodynamically stable.

## 2015-09-04 NOTE — Interval H&P Note (Signed)
History and Physical Interval Note:       09/04/2015 7:26 PM  Ashley Ramsey  has presented today for surgery, with the diagnosis of gastrointestinal bleed  The various methods of treatment have been discussed with the patient and family. After consideration of risks, benefits and other options for treatment, the patient has consented to  Procedure(s): ESOPHAGOGASTRODUODENOSCOPY (EGD) (N/A) as a surgical intervention .  The patient's history has been reviewed, patient examined, no change in status, stable for surgery.  I have reviewed the patient's chart and labs.  Questions were answered to the patient's satisfaction.     Renelda Loma Karryn Kosinski

## 2015-09-04 NOTE — Progress Notes (Signed)
Pt arrived from PACU. Pt alert and oriented. Nods approprietly. VSS. Large loose Dark bloody (almost black) stool noted and cleaned. MD called and notified about loss and Hg results. RBC started, GI MD stated give "as fast as you can" , not reactions are noted, pt stable, blood transfusing per MD VO. Bed in low position alarms are on, suction and extra trach at the bedside,obturator above HOB.  GI MD came at the bedside, explained upcoming procedure. Consent signed. Procedure is at the bedside at this time.

## 2015-09-04 NOTE — Progress Notes (Signed)
Addendum: I spoke with primary service and I recommend consultation with palliative care service

## 2015-09-04 NOTE — Progress Notes (Signed)
   Patient Name: Ashley Ramsey Date of Encounter: 09/04/2015, 9:34 AM    Subjective  Started having hematemesis, melena overnight.    Objective  BP 114/60 mmHg  Pulse 107  Temp(Src) 99.3 F (37.4 C) (Oral)  Resp 18  Ht 5\' 3"  (1.6 m)  Wt 250 lb (113.399 kg)  BMI 44.30 kg/m2  SpO2 98% NAD Lungs - coarse BS Cor S1S2 Pharynx no blood though limited view   CBC Latest Ref Rng 09/04/2015 09/03/2015 09/02/2015  WBC 4.0 - 10.5 K/uL 18.1(H) 21.0(H) 18.5(H)  Hemoglobin 12.0 - 15.0 g/dL 6.6(LL) 8.6(L) 8.2(L)  Hematocrit 36.0 - 46.0 % 20.7(L) 25.7(L) 25.0(L)  Platelets 150 - 400 K/uL 196 210 202       Assessment and Plan  Hematemesis andmelena after larnygeal mass biopsies  ENT coming to see her - would need that eval first It is my understanding that a trachostomy may be likely She cannot have GI endoscopy w/o better airway protection Will await Dr. Janeice Robinson evaluation  She needs blood and is getting it  Dr. Collene Mares or Benson Norway will follow tomorrow - I will f/u later today and am available on call until 5 then Dr. Havery Moros of my group   Gatha Mayer, MD, [facility] [phone] (pager) 09/04/2015 9:34 AM

## 2015-09-04 NOTE — Progress Notes (Signed)
eLink Physician-Brief Progress Note Patient Name: Ashley Ramsey DOB: [birth year] MRN: [id number]   Date of Service  09/04/2015  HPI/Events of Note  Concurrent  Laryngeal And renal cancer Transferred to ICU post tracheostomy UGIB - being evaluated  eICU Interventions  2 units transfusion plan Hemodynamically stable and breathing okay on trach collar, d/w hospitalist     Intervention Category Evaluation Type: New Patient Evaluation  ALVA,RAKESH V. 09/04/2015, 6:19 PM

## 2015-09-04 NOTE — Progress Notes (Signed)
GI Update: I received a call from the primary service in regards to this patient tonight, I am covering inpatient GI service this evening. The patient passed a large volume of what appeared to be red blood per rectum, and despite transfusions of PRBC today has a Hgb of 5.6 (down from 6s earlier). Chart reviewed, Dr. Carlean Purl updated me on her case who saw her previously this morning. She has been having melena earlier this week, concerning for upper GI bleed. She had frank hematemesis last night, and further bright red blood per rectum today. On IV PPI. She is hemodynamically stable at this time. She had a laryngeal mass biopsied recently and had a tracheostomy placed this morning to secure her airway. I spoke with Dr. Constance Holster about her procedure today and she appeared to have some mild oozing at the mass from discussion with him.   At this time I am not sure if she is bleeding from the laryngeal mass or from the upper GI tract. Given her ongoing bleeding this afternoon and failure to respond appropriately to PRBC transfusion, I offered her an upper endoscopy tonight. I discussed the risks / benefits of the procedure and sedation with the patient and family at length. She is hemodynamically stable at this time and currently receiving her PRBC transfusion. I hope I can pass the endoscope beyond the mass, if not, we could consider a tagged RBC scan to localize the source. Her airway is secure at this time with trach in place. Following a discussion of all of these issues the patient and family wished to proceed. Further recommendations pending the results of the exam.   Chouteau Cellar, MD [facility] Pager [phone]

## 2015-09-04 NOTE — Progress Notes (Signed)
Pt vomited bright red blood with big clot, MD on call was informed came over to review pt, v/s checked looks ok, pt new orders after MD'S review carried out, HB 6.6 MD on call notified ordered 2U of blood to be transfused, waitng for blood to be ready for pick up, will continue to monitor

## 2015-09-04 NOTE — Progress Notes (Addendum)
PATIENT DETAILS Name: Ashley Ramsey Age: 60 y.o. Sex: female Date of Birth: [birth year] Admit Date: 08/29/2015 Admitting Physician [name], MD OV:[id number], [name], MD  Brief narrative:  60 year old female with past medical history of hypertension, type 2 diabetes presented to the ED with worsening hoarseness of voice and abdominal pain. Upon further evaluation with a CT neck she was found to have a laryngeal mask, CT abdomen revealed a left renal mass. She was also found to have UTI with SIRS. She was admitted and started on IV antibiotics, ENT was consulted-patient underwent a biopsy on 1/20. Hospital course has been complicated by development of lower GI bleeding and associated blood loss anemia.   She had an episode of hematemesis on 09/04/2015 after with GI was reconsulted, ENT was called as well, ENT is now planning tracheostomy on 09/04/2015..  Subjective:  Patient in chair, had vomited some blood earlier on 09/04/2015, currently no chest abdominal pain, no shortness of breath.  Assessment/Plan:   Probable head and neck cancer: ENT consulted, underwent laryngoscopy with biopsy on 1/20. Oncology consulted, I had detailed discussions with oncologist Dr. Alvy Bimler on 09/04/2015. Patient's prognosis is extremely poor, poor candidate for chemotherapy, also most likely has renal cancer. She recommends palliative radiation treatments and consultation with palliative care.  She was doing fairly well but in the morning of 09/04/2015 had is of hematemesis. GI and ENT will be consulted. ENT planning to do a tracheostomy on 09/04/2015.  Acute blood loss related anemia. Transfuse and monitor H&H, likely due to hematemesis versus bleeding from laryngeal mass. Monitor H&H, IV PPI. GI and ENT both on board.  Addendum - 6pm called by ICU RN post Lurline Idol repeat Hb 5.1, oozing blood per rectum, BP stable, called GI they will see her now, change to IV PPI drip, called PCCM as  well they will monitor closely. Again overall prognosis extremely poor as D/W her daughter, oncology-GI and ENT.    Left renal mass: Highly suspicious for renal cell carcinoma. Doubt it is related to above. Spoke with Dr. Ree Kida MD on 1/17,he reviewed patient's CT scan in chart - recommends outpatient follow-up with him in the office for consideration of nephrectomy in the future.  However patient will have a laryngeal mass addressed first with palliative radiation treatments, currently too weak for chemotherapy and nephrectomy.   Dysphagia: Secondary to known laryngeal mass. Speech therapy following-completed MBS-recommendations are for dysphagia 1 diet. Await arrival of family-will discuss with patient/family regarding risks of aspiration. She will most likely may require PEG tube.  E coli pyelonephritis with SIRS: Sensitivities noted, continue Rocephin-stop date on 1/22.  Afebrile however WBC fluctuating-suspect persistent leukocytosis from inflammation due to malignancy-rather than UTI.  Lower GI bleeding: Seems to have resolved-no further hematochezia for the past 48 hours.  Suspect Diverticular bleed. CT scan abdomen shows diverticulosis. GI recommends holding off on colonoscopy at this time due to risks of the procedure and the need to address more pressing issues. Transfused a total of 3 units so far, Hb stable today  Acute blood loss anemia: Likely secondary to hematochezia, probably has chronic anemia from chronic disease. Transfused a total of 3 units today, Hgb stable at 8.36.Follow CBC.  Acute renal failure: Likely prerenal azotemia in a setting of UTI/GI bleeding-along with lisinopril/HCTZ use.  Resolved with hydration. Continue to monitor BMP daily given acute blood loss.  Hypernatremia: Will change to D5W and monitor.  Hypokalemia: Repleted-follow periodically  Hypertension: Controlled. Continue to hold lisinopril/HCTZ.   Tobacco abuse: Counseled  Alcohol abuse:  Counseled, completed Ativan per CIWA protocol-no signs of withdrawal  Left adrenal gland nodule: Will need outpatient follow-up, especially given left adrenal mass.  Subpleural nodules in the right middle lobe of right lung and left lower lobe of left lung: Will need to repeat CT chest in 6 months. Stable on room air, no shortness of breath  Possible CAD: Atherosclerosis seen CT chest. Unfortunately with ongoing lower GI bleeding-unable to use antiplatelets. Echo with LFEV 0000000, grade 1 diastolic dysfunction, no wall motion abnormalities. Given numerous above medical problems-not a candidate for any procedures/further workup at this point.  Morbid obesity: Has lost significant amount of weight over the past few months.  Type 2 diabetes: Controlled with CBGs 110-130s. Continue SSI, hold metformin. Follow CBGs  CBG (last 3)   Recent Labs  09/03/15 1637 09/03/15 2159 09/04/15 0757  GLUCAP 122* 144* 133*     Disposition: Remains inpatient-Home in next few days  Antimicrobial agents  See below  Anti-infectives    Start     Dose/Rate Route Frequency Ordered Stop   08/29/15 2330  cefTRIAXone (ROCEPHIN) 1 g in dextrose 5 % 50 mL IVPB     1 g 100 mL/hr over 30 Minutes Intravenous Every 24 hours 08/29/15 2250        DVT Prophylaxis: SCD's, no pharmacological DVT ppx given GI bleed/anemia  Code Status: Full code   Family Communication Daughter  Procedures:  ECHO- TTE  - Left ventricle: The cavity size was normal. Wall thickness was normal. Systolic function was normal. The estimated ejectionfraction was in the range of 55% to 60%. Wall motion was normal;there were no regional wall motion abnormalities. Dopplerparameters are consistent with abnormal left ventricularrelaxation (grade 1 diastolic dysfunction). Doppler parametersare consistent with high ventricular filling pressure. - Aortic valve: There was very mild stenosis. There was trivialregurgitation. - Mitral  valve: Calcified annulus. Mildly thickened leaflets . - Left atrium: The atrium was mildly dilated.  Tracheostomy scheduled for 09/03/2014 by ENT    CONSULTS:  GI, urology ,  ent, oncology, palliative care  Time spent 30 minutes-Greater than 50% of this time was spent in counseling, explanation of diagnosis, planning of further management, and coordination of care.  MEDICATIONS: Scheduled Meds: . sodium chloride   Intravenous Once  . acetaminophen  650 mg Oral Once  . cefTRIAXone (ROCEPHIN)  IV  1 g Intravenous Q24H  . folic acid  1 mg Oral Daily  . furosemide  10 mg Intravenous Q4H  . Influenza vac split quadrivalent PF  0.5 mL Intramuscular Tomorrow-1000  . insulin aspart  0-9 Units Subcutaneous TID WC  . loratadine  10 mg Oral Daily  . multivitamin with minerals  1 tablet Oral Daily  . pantoprazole (PROTONIX) IV  40 mg Intravenous Q12H  . thiamine  100 mg Oral Daily   Or  . thiamine  100 mg Intravenous Daily   Continuous Infusions: . sodium chloride    . lactated ringers 10 mL/hr at 09/02/15 1128   PRN Meds:.acetaminophen **OR** acetaminophen, dextromethorphan, HYDROcodone-acetaminophen, lidocaine, morphine injection, ondansetron **OR** ondansetron (ZOFRAN) IV, oxymetazoline, RESOURCE THICKENUP CLEAR    PHYSICAL EXAM: Vital signs in last 24 hours: Filed Vitals:   09/04/15 0349 09/04/15 0647 09/04/15 0937 09/04/15 1015  BP: 135/81 114/60 82/54 145/65  Pulse: 76 107 105 105  Temp:  99.3 F (37.4 C) 98.3 F (36.8 C) 98.6 F (37 C)  TempSrc:  Oral Oral Oral  Resp: 18  18 18   Height:      Weight:      SpO2:  98% 95% 100%    Weight change:  Filed Weights   08/29/15 1422  Weight: 113.399 kg (250 lb)   Body mass index is 44.3 kg/(m^2).   Gen Exam: Awake and alert with clear speech. Obese. Voice is very hoarse and weak. Neck: Supple-exam limited by body habitus. No stridor Chest:  CVS: S1 S2 regular. Abdomen: soft, non tender Extremities:  Neurologic: Non  Focal Psych: cooperative Wounds: N/A.   Intake/Output from previous day:  Intake/Output Summary (Last 24 hours) at 09/04/15 1106 Last data filed at 09/04/15 0100  Gross per 24 hour  Intake      0 ml  Output      1 ml  Net     -1 ml     LAB RESULTS: CBC  Recent Labs Lab 09/01/15 2000 09/02/15 0547 09/02/15 1946 09/03/15 0530 09/04/15 0325  WBC 18.9* 17.0* 18.5* 21.0* 18.1*  HGB 8.0* 8.3* 8.2* 8.6* 6.6*  HCT 24.7* 25.8* 25.0* 25.7* 20.7*  PLT 203 191 202 210 196  MCV 83.7 83.2 83.9 84.3 86.6  MCH 27.1 26.8 27.5 28.2 27.6  MCHC 32.4 32.2 32.8 33.5 31.9  RDW 14.1 14.5 14.8 15.1 15.4  LYMPHSABS  --   --   --   --  1.3  MONOABS  --   --   --   --  0.7  EOSABS  --   --   --   --  0.1  BASOSABS  --   --   --   --  0.0    Chemistries   Recent Labs Lab 08/30/15 0142 08/31/15 1028 09/01/15 0511 09/02/15 0547 09/04/15 0325  NA 137 144 148* 146* 146*  K 2.9* 3.9 3.8 3.6 3.7  CL 96* 113* 116* 112* 116*  CO2 31 24 27 24 23   GLUCOSE 165* 174* 127* 101* 216*  BUN 56* 36* 28* 22* 17  CREATININE 1.46* 0.88 0.84 0.82 0.89  CALCIUM 8.3* 8.6* 8.2* 8.4* 7.9*  MG 1.5* 1.8  --   --   --     CBG:  Recent Labs Lab 09/03/15 0939 09/03/15 1219 09/03/15 1637 09/03/15 2159 09/04/15 0757  GLUCAP 85 68 122* 144* 133*    GFR Estimated Creatinine Clearance: 82.5 mL/min (by C-G formula based on Cr of 0.89).  Coagulation profile No results for input(s): INR, PROTIME in the last 168 hours.  Cardiac Enzymes No results for input(s): CKMB, TROPONINI, MYOGLOBIN in the last 168 hours.  Invalid input(s): CK  Invalid input(s): POCBNP No results for input(s): DDIMER in the last 72 hours. No results for input(s): HGBA1C in the last 72 hours. No results for input(s): CHOL, HDL, LDLCALC, TRIG, CHOLHDL, LDLDIRECT in the last 72 hours. No results for input(s): TSH, T4TOTAL, T3FREE, THYROIDAB in the last 72 hours.  Invalid input(s): FREET3 No results for input(s): VITAMINB12,  FOLATE, FERRITIN, TIBC, IRON, RETICCTPCT in the last 72 hours. No results for input(s): LIPASE, AMYLASE in the last 72 hours.  Urine Studies No results for input(s): UHGB, CRYS in the last 72 hours.  Invalid input(s): UACOL, UAPR, USPG, UPH, UTP, UGL, UKET, UBIL, UNIT, UROB, ULEU, UEPI, UWBC, URBC, UBAC, CAST, UCOM, BILUA  MICROBIOLOGY: Recent Results (from the past 240 hour(s))  Culture, Urine     Status: None   Collection Time: 08/29/15  9:09 PM  Result Value Ref  Range Status   Specimen Description URINE, CATHETERIZED  Final   Special Requests NONE  Final   Culture >=100,000 COLONIES/mL ESCHERICHIA COLI  Final   Report Status 09/01/2015 FINAL  Final   Organism ID, Bacteria ESCHERICHIA COLI  Final      Susceptibility   Escherichia coli - MIC*    AMPICILLIN >=32 RESISTANT Resistant     CEFAZOLIN <=4 SENSITIVE Sensitive     CEFTRIAXONE <=1 SENSITIVE Sensitive     CIPROFLOXACIN <=0.25 SENSITIVE Sensitive     GENTAMICIN <=1 SENSITIVE Sensitive     IMIPENEM <=0.25 SENSITIVE Sensitive     NITROFURANTOIN <=16 SENSITIVE Sensitive     TRIMETH/SULFA <=20 SENSITIVE Sensitive     AMPICILLIN/SULBACTAM 4 SENSITIVE Sensitive     PIP/TAZO <=4 SENSITIVE Sensitive     * >=100,000 COLONIES/mL ESCHERICHIA COLI  Surgical pcr screen     Status: None   Collection Time: 09/01/15  4:09 AM  Result Value Ref Range Status   MRSA, PCR NEGATIVE NEGATIVE Final   Staphylococcus aureus NEGATIVE NEGATIVE Final    Comment:        The Xpert SA Assay (FDA approved for NASAL specimens in patients over 56 years of age), is one component of a comprehensive surveillance program.  Test performance has been validated by [facility] for patients greater than or equal to 12 year old. It is not intended to diagnose infection nor to guide or monitor treatment.     RADIOLOGY STUDIES/RESULTS: Dg Chest 2 View  08/29/2015  CLINICAL DATA:  One month history of chest pain and hoarse voice. EXAM: CHEST  2 VIEW  COMPARISON:  None. FINDINGS: The heart is borderline and enlarged. Mild tortuosity of the thoracic aorta. Low lung volumes with mild vascular crowding and streaky basilar atelectasis. There also bronchitic changes which could be acute or chronic. No infiltrates or effusions. The bony thorax is intact. IMPRESSION: Acute versus chronic bronchitic change.  No infiltrates or effusions Electronically Signed   By: Marijo Sanes M.D.   On: 08/29/2015 16:15   Ct Soft Tissue Neck W Contrast  08/29/2015  CLINICAL DATA:  60 year old female with abnormal full waist for 1 month. Abdominal pain. Three days of vomiting. Initial encounter. EXAM: CT NECK WITH CONTRAST TECHNIQUE: Multidetector CT imaging of the neck was performed using the standard protocol following the bolus administration of intravenous contrast. CONTRAST:  72mL OMNIPAQUE IOHEXOL 300 MG/ML SOLN in conjunction with contrast enhanced imaging of the chest, abdomen, and pelvis reported separately. COMPARISON:  CT chest abdomen and pelvis from today reported separately FINDINGS: Pharynx and larynx: Bulky supraglottic laryngeal tumor with marked soft tissue enlargement of the bilateral aryepiglottic folds and anterior commissure up to 17 mm in thickness. Tumor appears inseparable from the undersurface of the strap muscles suggesting extension through the thyroid cartilage bilaterally. Posterior hypopharynx involvement. The epiglottis is thickened, nodular, and distorted. Furthermore, the vallecula is mostly effaced and soft tissue at the base of tongue appears nodular and thickened. All told, tumor encompasses 37 x 40 x 58 mm (AP by transverse by CC). The other laryngeal cartilages appear spared. The palatine tonsils, soft palate, and nasopharynx appear within normal limits. Superior parapharyngeal spaces and retropharyngeal space are within normal limits. Salivary glands: Sublingual space, submandibular glands, and parotid glands are within normal limits. Thyroid:  Coarsely calcified 2.3 cm right thyroid nodule. Subcentimeter left lobe nodule. Lymph nodes: Abnormal right level 3 node measuring 9 mm at the level of the thyroid cartilage (series 1, image 57).  Small but asymmetric 5 mm right level IIIa lymph node at the level of the hyoid bone on the right (series 1, image 49). Bilateral level 2A nodes appear symmetric measuring 8-9 mm in thickness. No level 4, 5, or level 1 lymphadenopathy. Vascular: Suboptimal intravascular contrast timing, but major vascular structures in the neck and at the skullbase appear to remain patent. Left greater than right carotid bifurcation calcified atherosclerosis. Limited intracranial: Negative.  Partially empty sella. Visualized orbits: Negative. Mastoids and visualized paranasal sinuses: Visualized paranasal sinuses and mastoids are clear. Skeleton: Mostly absent dentition. Periapical lucency about the residual left mandible molar. No osseous metastatic disease identified. Upper chest: Reported separately today. IMPRESSION: 1. 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 2. Suspect metastatic nodal disease at level 3 on the right (small but asymmetric up to 9 mm nodes). Bilateral level 2 nodes are indeterminate and also measure up to 9 mm individually. No cystic or necrotic nodes in the neck. 3. See CT chest abdomen and pelvis from today reported separately. Electronically Signed   By: Genevie Ann M.D.   On: 08/29/2015 19:56   Ct Chest W Contrast  08/29/2015  CLINICAL DATA:  60 year old female with 2-3 day history of vomiting. Abdominal pain for the past month. Loss of voice. EXAM: CT CHEST, ABDOMEN, AND PELVIS WITH CONTRAST TECHNIQUE: Multidetector CT imaging of the chest, abdomen and pelvis was performed following the standard protocol during bolus administration of intravenous contrast. CONTRAST:  90mL OMNIPAQUE IOHEXOL 300 MG/ML  SOLN COMPARISON:  No  priors. FINDINGS: CT CHEST FINDINGS Mediastinum/Lymph Nodes: Heart size is normal. There is no significant pericardial fluid, thickening or pericardial calcification. There is atherosclerosis of the thoracic aorta, the great vessels of the mediastinum and the coronary arteries, including calcified atherosclerotic plaque in the left main, left anterior descending, left circumflex and right coronary arteries. Calcifications of the aortic valve and mitral valve/annulus. No pathologically enlarged mediastinal or hilar lymph nodes. Esophagus is unremarkable in appearance. No axillary lymphadenopathy. Lungs/Pleura: 4 mm subpleural nodule in the lateral segment of the right middle lobe (image 31 of series 4). 4 mm subpleural nodule in the posterior left lower lobe (image 38 of series 4). No other suspicious appearing pulmonary nodules or masses. No acute consolidative airspace disease. No pleural effusions. Musculoskeletal/Soft Tissues: There are no aggressive appearing lytic or blastic lesions noted in the visualized portions of the skeleton. CT ABDOMEN AND PELVIS FINDINGS Hepatobiliary: No cystic or solid hepatic lesions. No intra or extrahepatic biliary ductal dilatation. 7 mm calcified gallstone lying dependently in the gallbladder. No current findings to suggest an acute cholecystitis at this time. Pancreas: No pancreatic mass. No pancreatic ductal dilatation. No pancreatic or peripancreatic fluid or inflammatory changes. Spleen: Unremarkable. Adrenals/Urinary Tract: In the lower pole of the left kidney there is a 5.9 x 6.1 x 6.3 cm heterogeneously enhancing lesion highly concerning for renal cell carcinoma. The inferior aspect of this lesion comes in very close proximity to the anterior aspect of the left psoas muscle and quadratus lumborum muscle, however, there appears to be an intervening fat plane at this time. The lesion is well separated from the left renal vein. Right kidney and right adrenal gland are normal  in appearance. 1.2 x 1.4 cm indeterminate nodule in the lateral limb of the left adrenal gland. No hydroureteronephrosis. Urinary bladder is largely decompressed, but otherwise unremarkable in appearance. Stomach/Bowel: Normal appearance of the stomach. No pathologic  dilatation of small bowel or colon. A few scattered colonic diverticulae are noted, without surrounding inflammatory changes to suggest an acute diverticulitis at this time. Appendix is normal. Vascular/Lymphatic: Atherosclerosis throughout the abdominal and pelvic vasculature, without evidence of aneurysm or dissection. Left renal vein is widely patent and separate from the lower pole mass. No lymphadenopathy noted in the abdomen or pelvis. Reproductive: Uterus and ovaries are unremarkable in appearance. Other: No significant volume of ascites.  No pneumoperitoneum. Musculoskeletal: There are no aggressive appearing lytic or blastic lesions noted in the visualized portions of the skeleton. IMPRESSION: 1. No acute findings in the abdomen or pelvis to account for the patient's symptoms. 2. However, there is a 5.9 x 6.1 x 6.3 cm heterogeneously enhancing mass in the lower pole of the left kidney highly concerning for renal cell carcinoma. At this time, the lesion appears likely to be encapsulated within Gerota's fascia (although it comes very close to the left psoas and quadratus lumborum musculature), does not involve the left renal vein, and does not appear to be associated with lymphadenopathy. Nonemergent Urologic consultation for surgical resection is strongly recommended in the near future. 3. 1.2 x 1.4 cm indeterminate nodule in the left adrenal gland. Attention on followup studies is recommended, as a metastatic lesion is not excluded. 4. 4 mm subpleural nodules in the right middle lobe and left lower lobe. These are highly nonspecific, and favored to represent subpleural lymph nodes, but attention on followup studies is recommended to ensure  stability. 5. Cholelithiasis without evidence of acute cholecystitis at this time. 6. Atherosclerosis, including left main and 3 vessel coronary artery disease. Please note that although the presence of coronary artery calcium documents the presence of coronary artery disease, the severity of this disease and any potential stenosis cannot be assessed on this non-gated CT examination. Assessment for potential risk factor modification, dietary therapy or pharmacologic therapy may be warranted, if clinically indicated. 7. There are calcifications of the aortic valve and mitral valve/annulus. Echocardiographic correlation for evaluation of potential valvular dysfunction may be warranted if clinically indicated. 8. Colonic diverticulosis without evidence of acute diverticulitis at this time. 9. Additional incidental findings, as above. These results were called by telephone at the time of interpretation on 08/29/2015 at 7:34 pm to Dr. Davonna Belling, who verbally acknowledged these results. Electronically Signed   By: Vinnie Langton M.D.   On: 08/29/2015 19:37   Ct Abdomen Pelvis W Contrast  08/29/2015  CLINICAL DATA:  60 year old female with 2-3 day history of vomiting. Abdominal pain for the past month. Loss of voice. EXAM: CT CHEST, ABDOMEN, AND PELVIS WITH CONTRAST TECHNIQUE: Multidetector CT imaging of the chest, abdomen and pelvis was performed following the standard protocol during bolus administration of intravenous contrast. CONTRAST:  1mL OMNIPAQUE IOHEXOL 300 MG/ML  SOLN COMPARISON:  No priors. FINDINGS: CT CHEST FINDINGS Mediastinum/Lymph Nodes: Heart size is normal. There is no significant pericardial fluid, thickening or pericardial calcification. There is atherosclerosis of the thoracic aorta, the great vessels of the mediastinum and the coronary arteries, including calcified atherosclerotic plaque in the left main, left anterior descending, left circumflex and right coronary arteries.  Calcifications of the aortic valve and mitral valve/annulus. No pathologically enlarged mediastinal or hilar lymph nodes. Esophagus is unremarkable in appearance. No axillary lymphadenopathy. Lungs/Pleura: 4 mm subpleural nodule in the lateral segment of the right middle lobe (image 31 of series 4). 4 mm subpleural nodule in the posterior left lower lobe (image 38 of series 4). No other suspicious  appearing pulmonary nodules or masses. No acute consolidative airspace disease. No pleural effusions. Musculoskeletal/Soft Tissues: There are no aggressive appearing lytic or blastic lesions noted in the visualized portions of the skeleton. CT ABDOMEN AND PELVIS FINDINGS Hepatobiliary: No cystic or solid hepatic lesions. No intra or extrahepatic biliary ductal dilatation. 7 mm calcified gallstone lying dependently in the gallbladder. No current findings to suggest an acute cholecystitis at this time. Pancreas: No pancreatic mass. No pancreatic ductal dilatation. No pancreatic or peripancreatic fluid or inflammatory changes. Spleen: Unremarkable. Adrenals/Urinary Tract: In the lower pole of the left kidney there is a 5.9 x 6.1 x 6.3 cm heterogeneously enhancing lesion highly concerning for renal cell carcinoma. The inferior aspect of this lesion comes in very close proximity to the anterior aspect of the left psoas muscle and quadratus lumborum muscle, however, there appears to be an intervening fat plane at this time. The lesion is well separated from the left renal vein. Right kidney and right adrenal gland are normal in appearance. 1.2 x 1.4 cm indeterminate nodule in the lateral limb of the left adrenal gland. No hydroureteronephrosis. Urinary bladder is largely decompressed, but otherwise unremarkable in appearance. Stomach/Bowel: Normal appearance of the stomach. No pathologic dilatation of small bowel or colon. A few scattered colonic diverticulae are noted, without surrounding inflammatory changes to suggest an  acute diverticulitis at this time. Appendix is normal. Vascular/Lymphatic: Atherosclerosis throughout the abdominal and pelvic vasculature, without evidence of aneurysm or dissection. Left renal vein is widely patent and separate from the lower pole mass. No lymphadenopathy noted in the abdomen or pelvis. Reproductive: Uterus and ovaries are unremarkable in appearance. Other: No significant volume of ascites.  No pneumoperitoneum. Musculoskeletal: There are no aggressive appearing lytic or blastic lesions noted in the visualized portions of the skeleton. IMPRESSION: 1. No acute findings in the abdomen or pelvis to account for the patient's symptoms. 2. However, there is a 5.9 x 6.1 x 6.3 cm heterogeneously enhancing mass in the lower pole of the left kidney highly concerning for renal cell carcinoma. At this time, the lesion appears likely to be encapsulated within Gerota's fascia (although it comes very close to the left psoas and quadratus lumborum musculature), does not involve the left renal vein, and does not appear to be associated with lymphadenopathy. Nonemergent Urologic consultation for surgical resection is strongly recommended in the near future. 3. 1.2 x 1.4 cm indeterminate nodule in the left adrenal gland. Attention on followup studies is recommended, as a metastatic lesion is not excluded. 4. 4 mm subpleural nodules in the right middle lobe and left lower lobe. These are highly nonspecific, and favored to represent subpleural lymph nodes, but attention on followup studies is recommended to ensure stability. 5. Cholelithiasis without evidence of acute cholecystitis at this time. 6. Atherosclerosis, including left main and 3 vessel coronary artery disease. Please note that although the presence of coronary artery calcium documents the presence of coronary artery disease, the severity of this disease and any potential stenosis cannot be assessed on this non-gated CT examination. Assessment for potential  risk factor modification, dietary therapy or pharmacologic therapy may be warranted, if clinically indicated. 7. There are calcifications of the aortic valve and mitral valve/annulus. Echocardiographic correlation for evaluation of potential valvular dysfunction may be warranted if clinically indicated. 8. Colonic diverticulosis without evidence of acute diverticulitis at this time. 9. Additional incidental findings, as above. These results were called by telephone at the time of interpretation on 08/29/2015 at 7:34 pm to Dr. Ovid Curd  PICKERING, who verbally acknowledged these results. Electronically Signed   By: Vinnie Langton M.D.   On: 08/29/2015 19:37   Dg Swallowing Func-speech Pathology  09/03/2015  Objective Swallowing Evaluation:   Patient Details Name: Ashley Ramsey MRN: [id number] Date of Birth: 1956-07-19 Today's Date: 09/03/2015 Time: SLP Start Time (ACUTE ONLY): 1124-SLP Stop Time (ACUTE ONLY): 1143 SLP Time Calculation (min) (ACUTE ONLY): 19 min Past Medical History: Past Medical History Diagnosis Date . Thyroid disease  . Hypertension  . Neck mass hospitalized 08/29/2015 . Type II diabetes mellitus ([place])  . Left kidney mass  . Anemia, chronic disease  Past Surgical History: Past Surgical History Procedure Laterality Date . No past surgeries   HPI: Pt is 60 y.o. female with h/o hypertension and NIDDM. Pt presented to ED with voice changes, difficult swallowing and abdominal discomfort that have progressed over the last month. 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 Subjective: pt alert, eager for food/drink Assessment / Plan / Recommendation CHL IP CLINICAL IMPRESSIONS 09/03/2015 Therapy Diagnosis Severe pharyngeal phase dysphagia Clinical Impression Pt has a severe pharyngeal dysphaghia secondary to presence of supraglottic mass, which impedes bolus flow and decreased airway closure. All consistencies enter the airway during the swallow even with  tsp-sized boluses. Aspiration is usually sensed, but she is not able to expel aspirates from her trachea. A chin tuck paired with an immediate cough facilitates clearance through the pharynx and clearance of penetrates with all consistencies tested; however, all liquids are then re-penetrated and aspirated after the swallow. Purees do not re-enter the airway, although aspiration risk remains due to residue. Recommend Dys 1 diet and pudding thick liquids with a chin tuck and immediate cough following all bites. Impact on safety and function Moderate aspiration risk;Risk for inadequate nutrition/hydration   CHL IP TREATMENT RECOMMENDATION 09/03/2015 Treatment Recommendations Therapy as outlined in treatment plan below   Prognosis 09/03/2015 Prognosis for Safe Diet Advancement Guarded Barriers to Reach Goals -- Barriers/Prognosis Comment -- CHL IP DIET RECOMMENDATION 09/03/2015 SLP Diet Recommendations Dysphagia 1 (Puree) solids;Pudding thick liquid Liquid Administration via Spoon Medication Administration Crushed with puree Compensations Slow rate;Small sips/bites;Chin tuck;Hard cough after swallow Postural Changes Remain semi-upright after after feeds/meals (Comment);Seated upright at 90 degrees   CHL IP OTHER RECOMMENDATIONS 09/03/2015 Recommended Consults -- Oral Care Recommendations Oral care BID Other Recommendations Order thickener from pharmacy;Prohibited food (jello, ice cream, thin soups);Remove water pitcher   CHL IP FOLLOW UP RECOMMENDATIONS 09/03/2015 Follow up Recommendations Outpatient SLP;24 hour supervision/assistance   CHL IP FREQUENCY AND DURATION 09/03/2015 Speech Therapy Frequency (ACUTE ONLY) min 2x/week Treatment Duration 2 weeks      CHL IP ORAL PHASE 09/03/2015 Oral Phase WFL Oral - Pudding Teaspoon -- Oral - Pudding Cup -- Oral - Honey Teaspoon -- Oral - Honey Cup -- Oral - Nectar Teaspoon -- Oral - Nectar Cup -- Oral - Nectar Straw -- Oral - Thin Teaspoon -- Oral - Thin Cup -- Oral - Thin Straw --  Oral - Puree -- Oral - Mech Soft -- Oral - Regular -- Oral - Multi-Consistency -- Oral - Pill -- Oral Phase - Comment --  CHL IP PHARYNGEAL PHASE 09/03/2015 Pharyngeal Phase Impaired Pharyngeal- Pudding Teaspoon -- Pharyngeal -- Pharyngeal- Pudding Cup -- Pharyngeal -- Pharyngeal- Honey Teaspoon Reduced airway/laryngeal closure;Penetration/Aspiration during swallow;Penetration/Apiration after swallow;Pharyngeal residue - pyriform;Pharyngeal residue - posterior pharnyx;Compensatory strategies attempted (with notebox) Pharyngeal Material enters airway, passes BELOW cords and not ejected out despite cough attempt  by patient Pharyngeal- Honey Cup -- Pharyngeal -- Pharyngeal- Nectar Teaspoon Reduced airway/laryngeal closure;Penetration/Aspiration during swallow;Penetration/Apiration after swallow;Pharyngeal residue - pyriform;Pharyngeal residue - posterior pharnyx;Compensatory strategies attempted (with notebox) Pharyngeal Material enters airway, passes BELOW cords and not ejected out despite cough attempt by patient Pharyngeal- Nectar Cup -- Pharyngeal -- Pharyngeal- Nectar Straw -- Pharyngeal -- Pharyngeal- Thin Teaspoon Reduced airway/laryngeal closure;Penetration/Aspiration during swallow;Penetration/Apiration after swallow;Pharyngeal residue - pyriform;Pharyngeal residue - posterior pharnyx;Compensatory strategies attempted (with notebox) Pharyngeal Material enters airway, passes BELOW cords and not ejected out despite cough attempt by patient Pharyngeal- Thin Cup -- Pharyngeal -- Pharyngeal- Thin Straw Reduced airway/laryngeal closure;Penetration/Aspiration during swallow;Penetration/Apiration after swallow;Pharyngeal residue - pyriform;Pharyngeal residue - posterior pharnyx;Compensatory strategies attempted (with notebox) Pharyngeal Material enters airway, passes BELOW cords and not ejected out despite cough attempt by patient Pharyngeal- Puree Reduced airway/laryngeal closure;Penetration/Aspiration during  swallow;Compensatory strategies attempted (with notebox);Pharyngeal residue - posterior pharnyx Pharyngeal Material enters airway, remains ABOVE vocal cords and not ejected out Pharyngeal- Mechanical Soft -- Pharyngeal -- Pharyngeal- Regular -- Pharyngeal -- Pharyngeal- Multi-consistency -- Pharyngeal -- Pharyngeal- Pill -- Pharyngeal -- Pharyngeal Comment --  CHL IP CERVICAL ESOPHAGEAL PHASE 09/03/2015 Cervical Esophageal Phase (No Data) Pudding Teaspoon -- Pudding Cup -- Honey Teaspoon -- Honey Cup -- Nectar Teaspoon -- Nectar Cup -- Nectar Straw -- Thin Teaspoon -- Thin Cup -- Thin Straw -- Puree -- Mechanical Soft -- Regular -- Multi-consistency -- Pill -- Cervical Esophageal Comment -- No flowsheet data found. Germain Osgood, M.A. CCC-SLP [phone] Germain Osgood 09/03/2015, 1:33 PM               [place]   [facility] Pager:[phone]  If 7PM-7AM, please contact night-coverage [email or web address] Password [email or web address] 09/04/2015, 11:06 AM   LOS: 5 days

## 2015-09-04 NOTE — Progress Notes (Signed)
Patient ID: Ashley Ramsey, female   DOB: [birth year], 60 y.o.   MRN: [id number] She had a significant bleeding event during the night. She vomited a large amount of blood. She felt as though it came from vomitus, not necessarily from her throat. She is getting transfused now and she looks stable without any trouble breathing. She is going to require upper GI endoscopy to rule out a GI source of the bleed. Prior to that, she will need a more stable airway. Tracheostomy has been discussed previously and I agree that that is the safest option at this point. She understands and agrees. We will proceed with that this morning. I also discussed the possibility of placing a gastrostomy tube while she has her endoscopy since she is going to need one at some point anyway.

## 2015-09-04 NOTE — Transfer of Care (Signed)
Immediate Anesthesia Transfer of Care Note  Patient: Ashley Ramsey  Procedure(s) Performed: Procedure(s): TRACHEOSTOMY (N/A) DIRECT View LARYNGOSCOPY with cautery of of tumor  Patient Location: PACU  Anesthesia Type:General  Level of Consciousness: awake, patient cooperative and lethargic  Airway & Oxygen Therapy: Patient Spontanous Breathing and Patient connected to tracheostomy mask oxygen  Post-op Assessment: Report given to RN and Post -op Vital signs reviewed and unstable, Anesthesiologist notified  Pt tachycardic and hypotensive upon arrival to PACU. Maintaining patent airway.  Dr Lissa Hoard notified and at bedside.  Phenylephrine infusion started.  2 add'l units PRBC's ordered and blood bank notified.    Post vital signs: Reviewed and unstable  Last Vitals:  BP 87/58 HR 125 RR 21 SpO2 99 on O2 per trach collar Maintains good airway, denies pain.   Complications: No apparent anesthesia complications

## 2015-09-04 NOTE — Progress Notes (Signed)
Rn reports pt vomited large amount of bright red blood with large clots.  Pt has a neck mass and had laryngoscopy on 1/20. Pt has lower gi bleed.  Pt was transfused 3 units of blood.  Pt denies any current pain. No nausea.   Vitals tachycardia 150 Throat clear, Lungs clear, tachycardia Abdomen soft, nontender  Blood may be second to procedure. Stat cbc ordered I spoke to Dr. Constance Holster who advised gargle hydrogen peroxide and ice water. He advised to call him back if any further bleeding

## 2015-09-04 NOTE — Progress Notes (Addendum)
  EGD performed this evening (procedure note done but I don't see it uploaded into Epic yet). There was a large amount of blood clot in the proximal stomach which could not be cleared, thus this part of the exam was incompletely evaluated. However, there was a very large duodenal bulb ulcer extending into the duodenal sweep with a protuberance at the distal aspect of it with stigmata of bleeding (visible vessel?) and active oozing. 2 hemostasis clips were placed at the site and thought to have achieved hemostasis. This is a very high risk lesion for rebleeding given the size of it. Please check post-transfusion H/H and recommend serial H/H and transfuse PRBC as needed. Continue IV PPI. If she has evidence of recurrent bleeding please consult IR for consideration of embolization vs. Tagged RBC scan. I spoke with Dr. Kathlene Cote of IR tonight to make him aware of her in case his services are needed. I suspect the large ulcer with stigmata of bleeding is the most likely cause of her bleeding, but I could not clear the proximal stomach due to clot burden, perhaps there is pathology there which could have been bleeding as well, but think less likely. Recommend H pylori IgG serology otherwise and treat if positive. GI service to continue to follow the patient.   [place] Cellar, MD [facility] Pager [phone]

## 2015-09-04 NOTE — H&P (View-Only) (Signed)
   Patient Name: Ashley Ramsey Date of Encounter: 09/04/2015, 9:34 AM    Subjective  Started having hematemesis, melena overnight.    Objective  BP 114/60 mmHg  Pulse 107  Temp(Src) 99.3 F (37.4 C) (Oral)  Resp 18  Ht 5\' 3"  (1.6 m)  Wt 250 lb (113.399 kg)  BMI 44.30 kg/m2  SpO2 98% NAD Lungs - coarse BS Cor S1S2 Pharynx no blood though limited view   CBC Latest Ref Rng 09/04/2015 09/03/2015 09/02/2015  WBC 4.0 - 10.5 K/uL 18.1(H) 21.0(H) 18.5(H)  Hemoglobin 12.0 - 15.0 g/dL 6.6(LL) 8.6(L) 8.2(L)  Hematocrit 36.0 - 46.0 % 20.7(L) 25.7(L) 25.0(L)  Platelets 150 - 400 K/uL 196 210 202       Assessment and Plan  Hematemesis andmelena after larnygeal mass biopsies  ENT coming to see her - would need that eval first It is my understanding that a trachostomy may be likely She cannot have GI endoscopy w/o better airway protection Will await Dr. Janeice Robinson evaluation  She needs blood and is getting it  Dr. Collene Mares or Benson Norway will follow tomorrow - I will f/u later today and am available on call until 5 then Dr. Havery Moros of my group   Ashley Mayer, MD, [facility]astroenterology [phone] (pager) 09/04/2015 9:34 AM

## 2015-09-04 NOTE — Progress Notes (Signed)
Ashley Ramsey   DOB:1956/05/10   [id number]    This patient is diagnosed with concurrent supraglottic cancer and kidney cancer. Biopsies are pending Subjective: Overnight, she developed significant melena and hematochezia. Chest significant drop in hemoglobin. Transfusion is being arranged. She denies any chest pain, shortness of breath or dizziness. Denies throat pain. She denies hematuria  Objective:  Filed Vitals:   09/04/15 0349 09/04/15 0647  BP: 135/81 114/60  Pulse: 76 107  Temp:  99.3 F (37.4 C)  Resp: 18      Intake/Output Summary (Last 24 hours) at 09/04/15 [id number] Last data filed at 09/04/15 0100  Gross per 24 hour  Intake      0 ml  Output      1 ml  Net     -1 ml    GENERAL:alert, no distress and comfortable SKIN: skin color, texture, turgor are normal, no rashes or significant lesions EYES: normal, Conjunctiva are pale and non-injected, sclera clear OROPHARYNX:no exudate, no erythema and lips, buccal mucosa, and tongue normal . Poor dentition is noted NECK: supple, thyroid normal size, non-tender, without nodularity LYMPH:  Mild bilateral cervical lymphadenopathy, no axillary or inguinal lymphadenopathy LUNGS: Mild increased breathing effort with scattered bilateral wheezes  HEART: Mild tachycardia, regular rate & rhythm and no murmurs and no lower extremity edema ABDOMEN:abdomen soft, non-tender and normal bowel sounds. No guarding Musculoskeletal:no cyanosis of digits and no clubbing  NEURO: alert & oriented x 3 with hoarseness, no focal motor/sensory deficits   Labs:  Lab Results  Component Value Date   WBC 18.1* 09/04/2015   HGB 6.6* 09/04/2015   HCT 20.7* 09/04/2015   MCV 86.6 09/04/2015   PLT 196 09/04/2015   NEUTROABS 16.1* 09/04/2015    Lab Results  Component Value Date   NA 146* 09/04/2015   K 3.7 09/04/2015   CL 116* 09/04/2015   CO2 23 09/04/2015    Studies:  Dg Swallowing Func-speech Pathology  09/03/2015  Objective Swallowing  Evaluation:   Patient Details Name: Ashley Ramsey MRN: [id number] Date of Birth: 09/08/1955 Today's Date: 09/03/2015 Time: SLP Start Time (ACUTE ONLY): 1124-SLP Stop Time (ACUTE ONLY): 1143 SLP Time Calculation (min) (ACUTE ONLY): 19 min Past Medical History: Past Medical History Diagnosis Date . Thyroid disease  . Hypertension  . Neck mass hospitalized 08/29/2015 . Type II diabetes mellitus ([place])  . Left kidney mass  . Anemia, chronic disease  Past Surgical History: Past Surgical History Procedure Laterality Date . No past surgeries   HPI: Pt is 60 y.o. female with h/o hypertension and NIDDM. Pt presented to ED with voice changes, difficult swallowing and abdominal discomfort that have progressed over the last month. 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 Subjective: pt alert, eager for food/drink Assessment / Plan / Recommendation CHL IP CLINICAL IMPRESSIONS 09/03/2015 Therapy Diagnosis Severe pharyngeal phase dysphagia Clinical Impression Pt has a severe pharyngeal dysphaghia secondary to presence of supraglottic mass, which impedes bolus flow and decreased airway closure. All consistencies enter the airway during the swallow even with tsp-sized boluses. Aspiration is usually sensed, but she is not able to expel aspirates from her trachea. A chin tuck paired with an immediate cough facilitates clearance through the pharynx and clearance of penetrates with all consistencies tested; however, all liquids are then re-penetrated and aspirated after the swallow. Purees do not re-enter the airway, although aspiration risk remains due to residue. Recommend Dys 1 diet and pudding thick liquids  with a chin tuck and immediate cough following all bites. Impact on safety and function Moderate aspiration risk;Risk for inadequate nutrition/hydration   CHL IP TREATMENT RECOMMENDATION 09/03/2015 Treatment Recommendations Therapy as outlined in treatment plan below   Prognosis  09/03/2015 Prognosis for Safe Diet Advancement Guarded Barriers to Reach Goals -- Barriers/Prognosis Comment -- CHL IP DIET RECOMMENDATION 09/03/2015 SLP Diet Recommendations Dysphagia 1 (Puree) solids;Pudding thick liquid Liquid Administration via Spoon Medication Administration Crushed with puree Compensations Slow rate;Small sips/bites;Chin tuck;Hard cough after swallow Postural Changes Remain semi-upright after after feeds/meals (Comment);Seated upright at 90 degrees   CHL IP OTHER RECOMMENDATIONS 09/03/2015 Recommended Consults -- Oral Care Recommendations Oral care BID Other Recommendations Order thickener from pharmacy;Prohibited food (jello, ice cream, thin soups);Remove water pitcher   CHL IP FOLLOW UP RECOMMENDATIONS 09/03/2015 Follow up Recommendations Outpatient SLP;24 hour supervision/assistance   CHL IP FREQUENCY AND DURATION 09/03/2015 Speech Therapy Frequency (ACUTE ONLY) min 2x/week Treatment Duration 2 weeks      CHL IP ORAL PHASE 09/03/2015 Oral Phase WFL Oral - Pudding Teaspoon -- Oral - Pudding Cup -- Oral - Honey Teaspoon -- Oral - Honey Cup -- Oral - Nectar Teaspoon -- Oral - Nectar Cup -- Oral - Nectar Straw -- Oral - Thin Teaspoon -- Oral - Thin Cup -- Oral - Thin Straw -- Oral - Puree -- Oral - Mech Soft -- Oral - Regular -- Oral - Multi-Consistency -- Oral - Pill -- Oral Phase - Comment --  CHL IP PHARYNGEAL PHASE 09/03/2015 Pharyngeal Phase Impaired Pharyngeal- Pudding Teaspoon -- Pharyngeal -- Pharyngeal- Pudding Cup -- Pharyngeal -- Pharyngeal- Honey Teaspoon Reduced airway/laryngeal closure;Penetration/Aspiration during swallow;Penetration/Apiration after swallow;Pharyngeal residue - pyriform;Pharyngeal residue - posterior pharnyx;Compensatory strategies attempted (with notebox) Pharyngeal Material enters airway, passes BELOW cords and not ejected out despite cough attempt by patient Pharyngeal- Honey Cup -- Pharyngeal -- Pharyngeal- Nectar Teaspoon Reduced airway/laryngeal  closure;Penetration/Aspiration during swallow;Penetration/Apiration after swallow;Pharyngeal residue - pyriform;Pharyngeal residue - posterior pharnyx;Compensatory strategies attempted (with notebox) Pharyngeal Material enters airway, passes BELOW cords and not ejected out despite cough attempt by patient Pharyngeal- Nectar Cup -- Pharyngeal -- Pharyngeal- Nectar Straw -- Pharyngeal -- Pharyngeal- Thin Teaspoon Reduced airway/laryngeal closure;Penetration/Aspiration during swallow;Penetration/Apiration after swallow;Pharyngeal residue - pyriform;Pharyngeal residue - posterior pharnyx;Compensatory strategies attempted (with notebox) Pharyngeal Material enters airway, passes BELOW cords and not ejected out despite cough attempt by patient Pharyngeal- Thin Cup -- Pharyngeal -- Pharyngeal- Thin Straw Reduced airway/laryngeal closure;Penetration/Aspiration during swallow;Penetration/Apiration after swallow;Pharyngeal residue - pyriform;Pharyngeal residue - posterior pharnyx;Compensatory strategies attempted (with notebox) Pharyngeal Material enters airway, passes BELOW cords and not ejected out despite cough attempt by patient Pharyngeal- Puree Reduced airway/laryngeal closure;Penetration/Aspiration during swallow;Compensatory strategies attempted (with notebox);Pharyngeal residue - posterior pharnyx Pharyngeal Material enters airway, remains ABOVE vocal cords and not ejected out Pharyngeal- Mechanical Soft -- Pharyngeal -- Pharyngeal- Regular -- Pharyngeal -- Pharyngeal- Multi-consistency -- Pharyngeal -- Pharyngeal- Pill -- Pharyngeal -- Pharyngeal Comment --  CHL IP CERVICAL ESOPHAGEAL PHASE 09/03/2015 Cervical Esophageal Phase (No Data) Pudding Teaspoon -- Pudding Cup -- Honey Teaspoon -- Honey Cup -- Nectar Teaspoon -- Nectar Cup -- Nectar Straw -- Thin Teaspoon -- Thin Cup -- Thin Straw -- Puree -- Mechanical Soft -- Regular -- Multi-consistency -- Pill -- Cervical Esophageal Comment -- No flowsheet data found.  Germain Osgood, M.A. CCC-SLP [phone] Germain Osgood 09/03/2015, 1:33 PM               Assessment & Plan:   Supraglottic cancer  Subpleural lung nodules Please see below. With her multiple  other symptoms, I think she would benefit from inpatient radiation oncology consultation With ongoing GI bleed and her overall poor performance status, I do not feel comfortable recommending concurrent chemotherapy in the future. I have given the staff noticed my phone number to call me as soon as family arrives today to have a family meeting to discuss treatment options and prognosis.  Left renal mass with adrenal gland nodule this is highly suspicious for renal cell carcinoma. She is not symptomatic although undiagnosed kidney cancer can cause severe anemia. According to urology consult, plan for nephrectomy in the future   Severe anemia Recurrent melena and hematochezia this is multifactorial, likely anemia of chronic disease along with possible GI bleed due to history of recurrent hematochezia. She is going to be transfused today. I recommend keeping hemoglobin greater than 8 g due to ongoing GI bleed GI need to be consulted for urgent EGD and colonoscopy for source of bleeding Although uncommon, metastatic cancer to the GI tract could also cause she had bleed and hence I would not defer these procedures to be done as an outpatient  I recommend checking coagulation study in the next blood draw Discontinue Lovenox, recommend mechanical device only for DVT prophylaxis  Protein calorie malnutrition Her last serum albumin dated 08/31/2015 showed low albumin With her poor oral intake, she would benefit from ongoing follow-up by nutrition service I'm concerned about possible acquired coagulopathy causing GI bleed from synthetic liver dysfunction Recommend follow serum albumin closely along with coagulation study   Discharge planning This is a complicated case. The patient is young but  has concurrent diagnosis of kidney cancer and head and neck cancer with possibility of metastatic disease. Initially, I thought she may benefit from concurrent chemotherapy However, with ongoing GI bleed and other issues, I felt that she is no longer a candidate for concurrent chemotherapy I will speak with the family when they arrive later today We will discuss the case at the next ENT tumor board. Biopsies are pending.  [place], [place], MD 09/04/2015  9:22 AM

## 2015-09-04 NOTE — Progress Notes (Signed)
Addendum:  Spoke with daughter and grand daughter regarding poor prognosis with ongoing GI bleed, need for endoscopy and possible airway compromise needing tracheostomy and untreated kidney cancer. I would not offer her systemic chemotherapy. I have placed radiation consult. Biopsy pending. She would need placement of feeding tube in the near future

## 2015-09-04 NOTE — Anesthesia Preprocedure Evaluation (Addendum)
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 on medications  Rhythm:Regular Rate:Normal  08/31/15 ECHO: Normal LV systolic function; grade 1 diastolic dysfunction; calcified aortic valve with very mild AS (mean gradient 12 mmHg   Neuro/Psych    GI/Hepatic Neg liver ROS,   Endo/Other  diabetes, Oral Hypoglycemic AgentsMorbid obesityThyroid mass  Renal/GU Renal diseasenegative Renal ROS     Musculoskeletal   Abdominal   Peds  Hematology  (+) Blood dyscrasia (Hb 6.2), anemia ,   Anesthesia Other Findings Case was canceled yesterday for Hgb of 6.2  Reproductive/Obstetrics                            Anesthesia Physical  Anesthesia Plan  ASA: IV and emergent  Anesthesia Plan: General   Post-op Pain Management:    Induction: Intravenous  Airway Management Planned: Oral ETT and Video Laryngoscope Planned  Additional Equipment:   Intra-op Plan:   Post-operative Plan: Possible Post-op intubation/ventilation  Informed Consent: I have reviewed the patients History and Physical, chart, labs and discussed the procedure including the risks, benefits and alternatives for the proposed anesthesia with the patient or authorized representative who has indicated his/her understanding and acceptance.   Dental advisory given  Plan Discussed with: CRNA  Anesthesia Plan Comments:        Anesthesia Quick  Evaluation

## 2015-09-05 ENCOUNTER — Encounter (HOSPITAL_COMMUNITY): Payer: Self-pay | Admitting: Gastroenterology

## 2015-09-05 ENCOUNTER — Ambulatory Visit
Admit: 2015-09-05 | Discharge: 2015-09-05 | Disposition: A | Discharge status: Home / Self Care | Payer: Self-pay | Attending: Radiation Oncology | Admitting: Radiation Oncology

## 2015-09-05 LAB — BASIC METABOLIC PANEL
ANION GAP: 5 (ref 5–15)
BUN: 21 mg/dL — ABNORMAL HIGH (ref 6–20)
CHLORIDE: 115 mmol/L — AB (ref 101–111)
CO2: 25 mmol/L (ref 22–32)
Calcium: 7.1 mg/dL — ABNORMAL LOW (ref 8.9–10.3)
Creatinine, Ser: 0.96 mg/dL (ref 0.44–1.00)
GFR calc non Af Amer: >60 mL/min (ref >60)
Glucose, Bld: 127 mg/dL — ABNORMAL HIGH (ref 65–99)
Potassium: 3.8 mmol/L (ref 3.5–5.1)
SODIUM: 145 mmol/L (ref 135–145)

## 2015-09-05 LAB — CBC
HCT: 30.5 % — ABNORMAL LOW (ref 36.0–46.0)
HCT: 27.5 % — ABNORMAL LOW (ref 36.0–46.0)
HEMATOCRIT: 27.8 % — AB (ref 36.0–46.0)
HEMOGLOBIN: 9.6 g/dL — AB (ref 12.0–15.0)
HEMOGLOBIN: 10.6 g/dL — AB (ref 12.0–15.0)
Hemoglobin: 9.2 g/dL — ABNORMAL LOW (ref 12.0–15.0)
MCH: 28.7 pg (ref 26.0–34.0)
MCH: 29 pg (ref 26.0–34.0)
MCH: 28 pg (ref 26.0–34.0)
MCHC: 34.5 g/dL (ref 30.0–36.0)
MCHC: 34.8 g/dL (ref 30.0–36.0)
MCHC: 33.5 g/dL (ref 30.0–36.0)
MCV: 83.2 fL (ref 78.0–100.0)
MCV: 83.3 fL (ref 78.0–100.0)
MCV: 83.8 fL (ref 78.0–100.0)
PLATELETS: 113 10*3/uL — AB (ref 150–400)
PLATELETS: 113 10*3/uL — AB (ref 150–400)
Platelets: 103 10*3/uL — ABNORMAL LOW (ref 150–400)
RBC: 3.34 MIL/uL — ABNORMAL LOW (ref 3.87–5.11)
RBC: 3.66 MIL/uL — ABNORMAL LOW (ref 3.87–5.11)
RBC: 3.28 MIL/uL — ABNORMAL LOW (ref 3.87–5.11)
RDW: 14.9 % (ref 11.5–15.5)
RDW: 15.5 % (ref 11.5–15.5)
RDW: 15.6 % — AB (ref 11.5–15.5)
WBC: 19.1 10*3/uL — AB (ref 4.0–10.5)
WBC: 20.3 10*3/uL — ABNORMAL HIGH (ref 4.0–10.5)
WBC: 14.9 10*3/uL — ABNORMAL HIGH (ref 4.0–10.5)

## 2015-09-05 LAB — TYPE AND SCREEN
ABO/RH(D): B POS
ANTIBODY SCREEN: NEGATIVE
UNIT DIVISION: 0
UNIT DIVISION: 0
UNIT DIVISION: 0
Unit division: 0
Unit division: 0
Unit division: 0

## 2015-09-05 LAB — PHOSPHORUS: PHOSPHORUS: 2.6 mg/dL (ref 2.5–4.6)

## 2015-09-05 LAB — GLUCOSE, CAPILLARY
GLUCOSE-CAPILLARY: 106 mg/dL — AB (ref 65–99)
GLUCOSE-CAPILLARY: 114 mg/dL — AB (ref 65–99)
GLUCOSE-CAPILLARY: 137 mg/dL — AB (ref 65–99)
GLUCOSE-CAPILLARY: 151 mg/dL — AB (ref 65–99)
GLUCOSE-CAPILLARY: 95 mg/dL (ref 65–99)
GLUCOSE-CAPILLARY: 98 mg/dL (ref 65–99)
Glucose-Capillary: 190 mg/dL — ABNORMAL HIGH (ref 65–99)
Glucose-Capillary: 110 mg/dL — ABNORMAL HIGH (ref 65–99)

## 2015-09-05 LAB — MAGNESIUM: MAGNESIUM: 1.1 mg/dL — AB (ref 1.7–2.4)

## 2015-09-05 MED ORDER — DEXTROSE 5 % IV SOLN
INTRAVENOUS | Status: AC
  Administered 2015-09-06: 1000 mL via INTRAVENOUS

## 2015-09-05 MED ORDER — FAT EMULSION 20 % IV EMUL
240.0000 mL | INTRAVENOUS | Status: AC
  Administered 2015-09-05: 240 mL via INTRAVENOUS
  Filled 2015-09-05: qty 250

## 2015-09-05 MED ORDER — INSULIN ASPART 100 UNIT/ML ~~LOC~~ SOLN
0.0000 [IU] | Freq: Four times a day (QID) | SUBCUTANEOUS | Status: DC
  Administered 2015-09-06 (×2): 0 [IU] via SUBCUTANEOUS
  Administered 2015-09-07: 2 [IU] via SUBCUTANEOUS
  Administered 2015-09-07: 0 [IU] via SUBCUTANEOUS
  Administered 2015-09-08 (×3): 1 [IU] via SUBCUTANEOUS
  Administered 2015-09-08: 2 [IU] via SUBCUTANEOUS
  Administered 2015-09-09 (×2): 1 [IU] via SUBCUTANEOUS
  Administered 2015-09-09: 2 [IU] via SUBCUTANEOUS
  Administered 2015-09-10 – 2015-09-11 (×5): 1 [IU] via SUBCUTANEOUS
  Administered 2015-09-11: 2 [IU] via SUBCUTANEOUS
  Administered 2015-09-11: 1 [IU] via SUBCUTANEOUS
  Administered 2015-09-11: 2 [IU] via SUBCUTANEOUS
  Administered 2015-09-12 (×2): 1 [IU] via SUBCUTANEOUS
  Administered 2015-09-12: 2 [IU] via SUBCUTANEOUS
  Administered 2015-09-13 (×3): 1 [IU] via SUBCUTANEOUS

## 2015-09-05 MED ORDER — TRACE MINERALS CR-CU-MN-SE-ZN 10-1000-500-60 MCG/ML IV SOLN
INTRAVENOUS | Status: AC
  Administered 2015-09-05: 18:00:00 via INTRAVENOUS
  Filled 2015-09-05: qty 720

## 2015-09-05 MED ORDER — FOLIC ACID 5 MG/ML IJ SOLN
1.0000 mg | Freq: Every day | INTRAMUSCULAR | Status: DC
  Administered 2015-09-05 – 2015-09-07 (×3): 1 mg via INTRAVENOUS
  Filled 2015-09-05 (×5): qty 0.2

## 2015-09-05 MED ORDER — MORPHINE SULFATE (PF) 2 MG/ML IV SOLN
2.0000 mg | INTRAVENOUS | Status: DC | PRN
  Administered 2015-09-05 – 2015-09-12 (×5): 2 mg via INTRAVENOUS
  Filled 2015-09-05 (×5): qty 1

## 2015-09-05 MED ORDER — FUROSEMIDE 10 MG/ML IJ SOLN
40.0000 mg | Freq: Once | INTRAMUSCULAR | Status: AC
  Administered 2015-09-05: 40 mg via INTRAVENOUS

## 2015-09-05 MED ORDER — DIPHENHYDRAMINE HCL 50 MG/ML IJ SOLN
25.0000 mg | Freq: Once | INTRAMUSCULAR | Status: AC
  Administered 2015-09-06: 25 mg via INTRAVENOUS
  Filled 2015-09-05: qty 1

## 2015-09-05 MED ORDER — HYDRALAZINE HCL 20 MG/ML IJ SOLN
10.0000 mg | Freq: Four times a day (QID) | INTRAMUSCULAR | Status: DC | PRN
  Administered 2015-09-05: 10 mg via INTRAVENOUS
  Filled 2015-09-05: qty 1

## 2015-09-05 MED ORDER — DIPHENHYDRAMINE HCL 25 MG PO CAPS
50.0000 mg | ORAL_CAPSULE | Freq: Once | ORAL | Status: DC
  Filled 2015-09-05: qty 2

## 2015-09-05 MED ORDER — SODIUM CHLORIDE 0.9 % IJ SOLN
10.0000 mL | Freq: Two times a day (BID) | INTRAMUSCULAR | Status: DC
  Administered 2015-09-05 – 2015-09-07 (×4): 10 mL

## 2015-09-05 MED ORDER — ACETAMINOPHEN 650 MG RE SUPP: 325.0000 mg | RECTAL | Status: DC | PRN

## 2015-09-05 MED ORDER — DEXTROSE 5 % IV SOLN
10.0000 mmol | Freq: Once | INTRAVENOUS | Status: AC
  Administered 2015-09-05: 10 mmol via INTRAVENOUS
  Filled 2015-09-05: qty 3.33

## 2015-09-05 MED ORDER — MAGNESIUM SULFATE 2 GM/50ML IV SOLN
2.0000 g | Freq: Once | INTRAVENOUS | Status: AC
  Administered 2015-09-05: 2 g via INTRAVENOUS
  Filled 2015-09-05: qty 50

## 2015-09-05 MED ORDER — FUROSEMIDE 10 MG/ML IJ SOLN: 20.0000 mg | Freq: Once | INTRAMUSCULAR | Status: DC

## 2015-09-05 MED ORDER — SODIUM CHLORIDE 0.9 % IJ SOLN: 10.0000 mL | INTRAMUSCULAR | Status: DC | PRN

## 2015-09-05 MED ORDER — LORAZEPAM 2 MG/ML IJ SOLN
0.5000 mg | Freq: Four times a day (QID) | INTRAMUSCULAR | Status: DC | PRN
  Administered 2015-09-05: 0.5 mg via INTRAVENOUS
  Filled 2015-09-05: qty 1

## 2015-09-05 MED ORDER — FUROSEMIDE 10 MG/ML IJ SOLN
INTRAMUSCULAR | Status: AC
  Administered 2015-09-05: 40 mg via INTRAVENOUS
  Filled 2015-09-05: qty 4

## 2015-09-05 NOTE — Consult Note (Addendum)
Radiation Oncology         [phone] ________________________________  Name: Ashley Ramsey MRN: [id number]  Date: 08/29/2015  DOB: 07-06-1956  [id number], [name], MD  No ref. provider found     REFERRING PHYSICIAN: Dr. Leslie Andrea  DIAGNOSIS: The primary encounter diagnosis was Laryngeal cancer ([place]). Diagnoses of Encounter for central line placement, GI bleed, and Dysphagia were also pertinent to this visit.   HISTORY OF PRESENT ILLNESS::Ashley Ramsey is a 60 y.o. female seen at the request of Dr. Leslie Andrea for a newly diagnosed with cell carcinoma of the larynx. Patient had been experiencing difficulty with swallowing, and aphasia. She has been hoarse, and presented to the hospital for further assessment on the date of her admission. CT scan of the abdomen and pelvis, revealed a 5.9 x 6 x 0.1 cm left renal mass, and her CT scan of the neck revealed a mass within the pulmonary. She was seen in consultation by ENT, and underwent a biopsy of this laryngeal mass. Apparently this is 3.7 x 4 x 5.8 cm suspected nodal disease based on imaging. Biopsy reveals revealed invasive squamous cell carcinoma. She has undergone tracheostomy on 09/04/2015 and was also identified is having a GI bleed for which she underwent endoscopy with clipping of a duodenal ulcer. She has plans to go back to the endoscopy today for reassessment to determine if she is a candidate for a PEG tube. We are asked for consideration of palliative radiotherapy   PREVIOUS RADIATION THERAPY: No   PAST MEDICAL HISTORY:  has a past medical history of Thyroid disease; Hypertension; Neck mass (hospitalized 08/29/2015); Type II diabetes mellitus ([place]); Left kidney mass; and Anemia, chronic disease.     PAST SURGICAL HISTORY: Past Surgical History  Procedure Laterality Date  . No past surgeries    . Esophagogastroduodenoscopy N/A 09/04/2015    Procedure: ESOPHAGOGASTRODUODENOSCOPY (EGD);  Surgeon: Manus Gunning, MD;  Location: [place];  Service: Gastroenterology;  Laterality: N/A;  . Direct laryngoscopy N/A 09/02/2015    Procedure: DIRECT LARYNGOSCOPY WITH BIOPSY;  Surgeon: Izora Gala, MD;  Location: [place] [facility];  Service: ENT;  Laterality: N/A;  . Esophagoscopy N/A 09/02/2015    Procedure: ESOPHAGOSCOPY;  Surgeon: Izora Gala, MD;  Location: [place];  Service: ENT;  Laterality: N/A;  . Tracheostomy tube placement N/A 09/04/2015    Procedure: TRACHEOSTOMY;  Surgeon: Izora Gala, MD;  Location: [place];  Service: ENT;  Laterality: N/A;  . Direct laryngoscopy  09/04/2015    Procedure: DIRECT View LARYNGOSCOPY with cautery of of tumor;  Surgeon: Izora Gala, MD;  Location: [place];  Service: ENT;;     FAMILY HISTORY: family history includes Cancer in her daughter; Diabetes in her mother and sister; Heart disease in her mother; Hypertension in her mother and sister.   SOCIAL HISTORY:  reports that she quit smoking about 7 weeks ago. Her smoking use included Cigarettes. She has a 17 pack-year smoking history. She has never used smokeless tobacco. She reports that she drinks about 15.0 oz of alcohol per week. She reports that she does not use illicit drugs.   ALLERGIES: Review of patient's allergies indicates no known allergies.   MEDICATIONS:  Current Facility-Administered Medications  Medication Dose Route Frequency Provider Last Rate Last Dose  . 0.9 %  sodium chloride infusion   Intravenous Once Jonetta Osgood, MD      . acetaminophen (TYLENOL) suppository 325 mg  325 mg Rectal Q4H PRN Thurnell Lose, MD      .  cefTRIAXone (ROCEPHIN) 1 g in dextrose 5 % 50 mL IVPB  1 g Intravenous Q24H Rolla Flatten, RPH   1 g at 09/05/15 2144  . dextromethorphan (DELSYM) 30 MG/5ML liquid 30 mg  30 mg Oral BID PRN Jonetta Osgood, MD      . TPN (CLINIMIX-E) Adult   Intravenous Continuous TPN Darnell Level Mancheril, RPH 30 mL/hr at 09/05/15 1754     And  . fat emulsion 20 % infusion 240 mL  240 mL Intravenous Continuous TPN  Darnell Level Mancheril, RPH 10 mL/hr at 09/05/15 1754 240 mL at 09/05/15 1754  . folic acid injection 1 mg  1 mg Intravenous Daily Marijean Heath, NP   1 mg at 09/05/15 1234  . hydrALAZINE (APRESOLINE) injection 10 mg  10 mg Intravenous Q6H PRN Thurnell Lose, MD   10 mg at 09/05/15 1845  . Influenza vac split quadrivalent PF (FLUARIX) injection 0.5 mL  0.5 mL Intramuscular Tomorrow-1000 [name], MD   0.5 mL at 08/31/15 1000  . insulin aspart (novoLOG) injection 0-9 Units  0-9 Units Subcutaneous 4 times per day Thurnell Lose, MD   0 Units at 09/06/15 [id number]  . lactated ringers infusion   Intravenous Continuous Lauretta Grill, MD 10 mL/hr at 09/05/15 2145 1,000 mL at 09/05/15 2145  . lidocaine (XYLOCAINE) 4 % external solution 0-50 mL  0-50 mL Topical Once PRN Izora Gala, MD      . loratadine (CLARITIN) tablet 10 mg  10 mg Oral Daily Jonetta Osgood, MD   10 mg at 09/03/15 1715  . LORazepam (ATIVAN) injection 0.5 mg  0.5 mg Intravenous Q6H PRN Thurnell Lose, MD   0.5 mg at 09/05/15 2145  . magnesium sulfate IVPB 1 g 100 mL  1 g Intravenous Once Lavenia Atlas, RPH      . morphine 2 MG/ML injection 2 mg  2 mg Intravenous Q4H PRN Thurnell Lose, MD   2 mg at 09/05/15 2145  . ondansetron (ZOFRAN) tablet 4 mg  4 mg Oral Q6H PRN [name], MD       Or  . ondansetron (ZOFRAN) injection 4 mg  4 mg Intravenous Q6H PRN [name], MD   4 mg at 09/05/15 2145  . oxymetazoline (AFRIN) 0.05 % nasal spray 1 spray  1 spray Each Nare BID PRN Jonetta Osgood, MD      . pantoprazole (PROTONIX) 80 mg in sodium chloride 0.9 % 250 mL (0.32 mg/mL) infusion  8 mg/hr Intravenous Continuous Thurnell Lose, MD 25 mL/hr at 09/06/15 0142 8 mg/hr at 09/06/15 0142  . potassium chloride 10 mEq in 100 mL IVPB  10 mEq Intravenous Q1 Hr x 4 Thurnell Lose, MD   10 mEq at 09/06/15 0811  . Royal   Oral PRN Jonetta Osgood, MD      . sodium chloride 0.9 %  injection 10-40 mL  10-40 mL Intracatheter Q12H Thurnell Lose, MD   10 mL at 09/05/15 2146  . thiamine (B-1) injection 100 mg  100 mg Intravenous Daily [name], MD   100 mg at 09/05/15 1136     REVIEW OF SYSTEMS: On review of systems, the patient is able to communicate by lip breathing, and making gestures as well as writing. She states that she had been experiencing trouble with swallowing and pain with swallowing for about 2 months. She has lost about 20 pounds in that  time frame. She denies any current abdominal pain or nausea. She denies any fevers. She is very warm and states that she can get comfortable in the bed. She's been trying to suction her tracheostomy site. She is staying upbeat despite all that's been going on. No other complaints or verbalized.    PHYSICAL EXAM:  height is 5\' 3"  (1.6 m) and weight is 250 lb (113.399 kg). Her oral temperature is 99.3 F (37.4 C). Her blood pressure is 134/88 and her pulse is 90. Her respiration is 21 and oxygen saturation is 96%.   Pain scale 0/10 In general this is a  somewhat ill but nontoxic-appearing African-American female in no acute distress. She is alert and oriented times for an appropriate throughout the examination. Her tracheostomy site is assessed and appears to be in good position. There is thick mucus from the site itself and suctioned with the [place]. This is also wiped away with gauze. Patient has normal respiratory effort and does not appear to be in any acute distress cardiovascular standpoint. She is in normal sinus rhythm on telemetry.  ECOG = 3  0 - Asymptomatic (Fully active, able to carry on all predisease activities without restriction)  1 - Symptomatic but completely ambulatory (Restricted in physically strenuous activity but ambulatory and able to carry out work of a light or sedentary nature. For example, light housework, office work)  2 - Symptomatic, <50% in bed during the day (Ambulatory and capable  of all self care but unable to carry out any work activities. Up and about more than 50% of waking hours)  3 - Symptomatic, >50% in bed, but not bedbound (Capable of only limited self-care, confined to bed or chair 50% or more of waking hours)  4 - Bedbound (Completely disabled. Cannot carry on any self-care. Totally confined to bed or chair)  5 - Death   Eustace Pen MM, Creech RH, Tormey DC, et al. [phone]). "Toxicity and response criteria of the [facility]". [place] Oncol. 5 (6): 649-55    LABORATORY DATA:  Lab Results  Component Value Date   WBC 12.9* 09/06/2015   HGB 8.2* 09/06/2015   HCT 24.6* 09/06/2015   MCV 84.5 09/06/2015   PLT 107* 09/06/2015   Lab Results  Component Value Date   NA 142 09/06/2015   K 3.3* 09/06/2015   CL 108 09/06/2015   CO2 27 09/06/2015   Lab Results  Component Value Date   ALT 9* 09/06/2015   AST 13* 09/06/2015   ALKPHOS 48 09/06/2015   BILITOT 0.3 09/06/2015      RADIOGRAPHY: Dg Chest 2 View  08/29/2015  CLINICAL DATA:  One month history of chest pain and hoarse voice. EXAM: CHEST  2 VIEW COMPARISON:  None. FINDINGS: The heart is borderline and enlarged. Mild tortuosity of the thoracic aorta. Low lung volumes with mild vascular crowding and streaky basilar atelectasis. There also bronchitic changes which could be acute or chronic. No infiltrates or effusions. The bony thorax is intact. IMPRESSION: Acute versus chronic bronchitic change.  No infiltrates or effusions Electronically Signed   By: Marijo Sanes M.D.   On: 08/29/2015 16:15   Ct Soft Tissue Neck W Contrast  08/29/2015  CLINICAL DATA:  60 year old female with abnormal full waist for 1 month. Abdominal pain. Three days of vomiting. Initial encounter. EXAM: CT NECK WITH CONTRAST TECHNIQUE: Multidetector CT imaging of the neck was performed using the standard protocol following the bolus administration of intravenous contrast. CONTRAST:  33mL OMNIPAQUE IOHEXOL 300  MG/ML SOLN in conjunction with contrast enhanced imaging of the chest, abdomen, and pelvis reported separately. COMPARISON:  CT chest abdomen and pelvis from today reported separately FINDINGS: Pharynx and larynx: Bulky supraglottic laryngeal tumor with marked soft tissue enlargement of the bilateral aryepiglottic folds and anterior commissure up to 17 mm in thickness. Tumor appears inseparable from the undersurface of the strap muscles suggesting extension through the thyroid cartilage bilaterally. Posterior hypopharynx involvement. The epiglottis is thickened, nodular, and distorted. Furthermore, the vallecula is mostly effaced and soft tissue at the base of tongue appears nodular and thickened. All told, tumor encompasses 37 x 40 x 58 mm (AP by transverse by CC). The other laryngeal cartilages appear spared. The palatine tonsils, soft palate, and nasopharynx appear within normal limits. Superior parapharyngeal spaces and retropharyngeal space are within normal limits. Salivary glands: Sublingual space, submandibular glands, and parotid glands are within normal limits. Thyroid: Coarsely calcified 2.3 cm right thyroid nodule. Subcentimeter left lobe nodule. Lymph nodes: Abnormal right level 3 node measuring 9 mm at the level of the thyroid cartilage (series 1, image 57). Small but asymmetric 5 mm right level IIIa lymph node at the level of the hyoid bone on the right (series 1, image 49). Bilateral level 2A nodes appear symmetric measuring 8-9 mm in thickness. No level 4, 5, or level 1 lymphadenopathy. Vascular: Suboptimal intravascular contrast timing, but major vascular structures in the neck and at the skullbase appear to remain patent. Left greater than right carotid bifurcation calcified atherosclerosis. Limited intracranial: Negative.  Partially empty sella. Visualized orbits: Negative. Mastoids and visualized paranasal sinuses: Visualized paranasal sinuses and mastoids are clear. Skeleton: Mostly absent  dentition. Periapical lucency about the residual left mandible molar. No osseous metastatic disease identified. Upper chest: Reported separately today. IMPRESSION: 1. 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 2. Suspect metastatic nodal disease at level 3 on the right (small but asymmetric up to 9 mm nodes). Bilateral level 2 nodes are indeterminate and also measure up to 9 mm individually. No cystic or necrotic nodes in the neck. 3. See CT chest abdomen and pelvis from today reported separately. Electronically Signed   By: Genevie Ann M.D.   On: 08/29/2015 19:56   Ct Chest W Contrast  08/29/2015  CLINICAL DATA:  60 year old female with 2-3 day history of vomiting. Abdominal pain for the past month. Loss of voice. EXAM: CT CHEST, ABDOMEN, AND PELVIS WITH CONTRAST TECHNIQUE: Multidetector CT imaging of the chest, abdomen and pelvis was performed following the standard protocol during bolus administration of intravenous contrast. CONTRAST:  41mL OMNIPAQUE IOHEXOL 300 MG/ML  SOLN COMPARISON:  No priors. FINDINGS: CT CHEST FINDINGS Mediastinum/Lymph Nodes: Heart size is normal. There is no significant pericardial fluid, thickening or pericardial calcification. There is atherosclerosis of the thoracic aorta, the great vessels of the mediastinum and the coronary arteries, including calcified atherosclerotic plaque in the left main, left anterior descending, left circumflex and right coronary arteries. Calcifications of the aortic valve and mitral valve/annulus. No pathologically enlarged mediastinal or hilar lymph nodes. Esophagus is unremarkable in appearance. No axillary lymphadenopathy. Lungs/Pleura: 4 mm subpleural nodule in the lateral segment of the right middle lobe (image 31 of series 4). 4 mm subpleural nodule in the posterior left lower lobe (image 38 of series 4). No other suspicious appearing pulmonary nodules or  masses. No acute consolidative airspace disease. No pleural effusions. Musculoskeletal/Soft Tissues: There  are no aggressive appearing lytic or blastic lesions noted in the visualized portions of the skeleton. CT ABDOMEN AND PELVIS FINDINGS Hepatobiliary: No cystic or solid hepatic lesions. No intra or extrahepatic biliary ductal dilatation. 7 mm calcified gallstone lying dependently in the gallbladder. No current findings to suggest an acute cholecystitis at this time. Pancreas: No pancreatic mass. No pancreatic ductal dilatation. No pancreatic or peripancreatic fluid or inflammatory changes. Spleen: Unremarkable. Adrenals/Urinary Tract: In the lower pole of the left kidney there is a 5.9 x 6.1 x 6.3 cm heterogeneously enhancing lesion highly concerning for renal cell carcinoma. The inferior aspect of this lesion comes in very close proximity to the anterior aspect of the left psoas muscle and quadratus lumborum muscle, however, there appears to be an intervening fat plane at this time. The lesion is well separated from the left renal vein. Right kidney and right adrenal gland are normal in appearance. 1.2 x 1.4 cm indeterminate nodule in the lateral limb of the left adrenal gland. No hydroureteronephrosis. Urinary bladder is largely decompressed, but otherwise unremarkable in appearance. Stomach/Bowel: Normal appearance of the stomach. No pathologic dilatation of small bowel or colon. A few scattered colonic diverticulae are noted, without surrounding inflammatory changes to suggest an acute diverticulitis at this time. Appendix is normal. Vascular/Lymphatic: Atherosclerosis throughout the abdominal and pelvic vasculature, without evidence of aneurysm or dissection. Left renal vein is widely patent and separate from the lower pole mass. No lymphadenopathy noted in the abdomen or pelvis. Reproductive: Uterus and ovaries are unremarkable in appearance. Other: No significant volume of ascites.  No pneumoperitoneum.  Musculoskeletal: There are no aggressive appearing lytic or blastic lesions noted in the visualized portions of the skeleton. IMPRESSION: 1. No acute findings in the abdomen or pelvis to account for the patient's symptoms. 2. However, there is a 5.9 x 6.1 x 6.3 cm heterogeneously enhancing mass in the lower pole of the left kidney highly concerning for renal cell carcinoma. At this time, the lesion appears likely to be encapsulated within Gerota's fascia (although it comes very close to the left psoas and quadratus lumborum musculature), does not involve the left renal vein, and does not appear to be associated with lymphadenopathy. Nonemergent Urologic consultation for surgical resection is strongly recommended in the near future. 3. 1.2 x 1.4 cm indeterminate nodule in the left adrenal gland. Attention on followup studies is recommended, as a metastatic lesion is not excluded. 4. 4 mm subpleural nodules in the right middle lobe and left lower lobe. These are highly nonspecific, and favored to represent subpleural lymph nodes, but attention on followup studies is recommended to ensure stability. 5. Cholelithiasis without evidence of acute cholecystitis at this time. 6. Atherosclerosis, including left main and 3 vessel coronary artery disease. Please note that although the presence of coronary artery calcium documents the presence of coronary artery disease, the severity of this disease and any potential stenosis cannot be assessed on this non-gated CT examination. Assessment for potential risk factor modification, dietary therapy or pharmacologic therapy may be warranted, if clinically indicated. 7. There are calcifications of the aortic valve and mitral valve/annulus. Echocardiographic correlation for evaluation of potential valvular dysfunction may be warranted if clinically indicated. 8. Colonic diverticulosis without evidence of acute diverticulitis at this time. 9. Additional incidental findings, as above.  These results were called by telephone at the time of interpretation on 08/29/2015 at 7:34 pm to Dr. Davonna Belling, who verbally acknowledged these results. Electronically Signed   By: Mauri Brooklyn.D.  On: 08/29/2015 19:37   Ct Abdomen Pelvis W Contrast  08/29/2015  CLINICAL DATA:  60 year old female with 2-3 day history of vomiting. Abdominal pain for the past month. Loss of voice. EXAM: CT CHEST, ABDOMEN, AND PELVIS WITH CONTRAST TECHNIQUE: Multidetector CT imaging of the chest, abdomen and pelvis was performed following the standard protocol during bolus administration of intravenous contrast. CONTRAST:  2mL OMNIPAQUE IOHEXOL 300 MG/ML  SOLN COMPARISON:  No priors. FINDINGS: CT CHEST FINDINGS Mediastinum/Lymph Nodes: Heart size is normal. There is no significant pericardial fluid, thickening or pericardial calcification. There is atherosclerosis of the thoracic aorta, the great vessels of the mediastinum and the coronary arteries, including calcified atherosclerotic plaque in the left main, left anterior descending, left circumflex and right coronary arteries. Calcifications of the aortic valve and mitral valve/annulus. No pathologically enlarged mediastinal or hilar lymph nodes. Esophagus is unremarkable in appearance. No axillary lymphadenopathy. Lungs/Pleura: 4 mm subpleural nodule in the lateral segment of the right middle lobe (image 31 of series 4). 4 mm subpleural nodule in the posterior left lower lobe (image 38 of series 4). No other suspicious appearing pulmonary nodules or masses. No acute consolidative airspace disease. No pleural effusions. Musculoskeletal/Soft Tissues: There are no aggressive appearing lytic or blastic lesions noted in the visualized portions of the skeleton. CT ABDOMEN AND PELVIS FINDINGS Hepatobiliary: No cystic or solid hepatic lesions. No intra or extrahepatic biliary ductal dilatation. 7 mm calcified gallstone lying dependently in the gallbladder. No current  findings to suggest an acute cholecystitis at this time. Pancreas: No pancreatic mass. No pancreatic ductal dilatation. No pancreatic or peripancreatic fluid or inflammatory changes. Spleen: Unremarkable. Adrenals/Urinary Tract: In the lower pole of the left kidney there is a 5.9 x 6.1 x 6.3 cm heterogeneously enhancing lesion highly concerning for renal cell carcinoma. The inferior aspect of this lesion comes in very close proximity to the anterior aspect of the left psoas muscle and quadratus lumborum muscle, however, there appears to be an intervening fat plane at this time. The lesion is well separated from the left renal vein. Right kidney and right adrenal gland are normal in appearance. 1.2 x 1.4 cm indeterminate nodule in the lateral limb of the left adrenal gland. No hydroureteronephrosis. Urinary bladder is largely decompressed, but otherwise unremarkable in appearance. Stomach/Bowel: Normal appearance of the stomach. No pathologic dilatation of small bowel or colon. A few scattered colonic diverticulae are noted, without surrounding inflammatory changes to suggest an acute diverticulitis at this time. Appendix is normal. Vascular/Lymphatic: Atherosclerosis throughout the abdominal and pelvic vasculature, without evidence of aneurysm or dissection. Left renal vein is widely patent and separate from the lower pole mass. No lymphadenopathy noted in the abdomen or pelvis. Reproductive: Uterus and ovaries are unremarkable in appearance. Other: No significant volume of ascites.  No pneumoperitoneum. Musculoskeletal: There are no aggressive appearing lytic or blastic lesions noted in the visualized portions of the skeleton. IMPRESSION: 1. No acute findings in the abdomen or pelvis to account for the patient's symptoms. 2. However, there is a 5.9 x 6.1 x 6.3 cm heterogeneously enhancing mass in the lower pole of the left kidney highly concerning for renal cell carcinoma. At this time, the lesion appears likely to  be encapsulated within Gerota's fascia (although it comes very close to the left psoas and quadratus lumborum musculature), does not involve the left renal vein, and does not appear to be associated with lymphadenopathy. Nonemergent Urologic consultation for surgical resection is strongly recommended in the near future. 3. 1.2  x 1.4 cm indeterminate nodule in the left adrenal gland. Attention on followup studies is recommended, as a metastatic lesion is not excluded. 4. 4 mm subpleural nodules in the right middle lobe and left lower lobe. These are highly nonspecific, and favored to represent subpleural lymph nodes, but attention on followup studies is recommended to ensure stability. 5. Cholelithiasis without evidence of acute cholecystitis at this time. 6. Atherosclerosis, including left main and 3 vessel coronary artery disease. Please note that although the presence of coronary artery calcium documents the presence of coronary artery disease, the severity of this disease and any potential stenosis cannot be assessed on this non-gated CT examination. Assessment for potential risk factor modification, dietary therapy or pharmacologic therapy may be warranted, if clinically indicated. 7. There are calcifications of the aortic valve and mitral valve/annulus. Echocardiographic correlation for evaluation of potential valvular dysfunction may be warranted if clinically indicated. 8. Colonic diverticulosis without evidence of acute diverticulitis at this time. 9. Additional incidental findings, as above. These results were called by telephone at the time of interpretation on 08/29/2015 at 7:34 pm to Dr. Davonna Belling, who verbally acknowledged these results. Electronically Signed   By: Vinnie Langton M.D.   On: 08/29/2015 19:37   Dg Chest Port 1 View  09/04/2015  CLINICAL DATA:  Central line placement. EXAM: PORTABLE CHEST 1 VIEW COMPARISON:  08/29/2015 FINDINGS: Endotracheal tube terminates 6 cm above the  carina. Right internal jugular approach central venous catheter seen with tip at the expected location of superior vena cava. Cardiomediastinal silhouette is enlarged, likely exaggerated by portable technique and rotation. Mediastinal contours appear intact. There is no evidence of focal airspace consolidation, pleural effusion or pneumothorax. Lung volumes are low. Osseous structures are without acute abnormality. Soft tissues are grossly normal. IMPRESSION: Low lung volumes. Endotracheal tube 6 cm above the carina. Advancement with 2 cm may be considered. Right-sided central venous catheter in satisfactory position radiographically. Electronically Signed   By: Fidela Salisbury M.D.   On: 09/04/2015 16:06   Dg Swallowing Func-speech Pathology  09/03/2015  Objective Swallowing Evaluation:   Patient Details Name: Ashley Ramsey MRN: [id number] Date of Birth: 1955-12-15 Today's Date: 09/03/2015 Time: SLP Start Time (ACUTE ONLY): 1124-SLP Stop Time (ACUTE ONLY): 1143 SLP Time Calculation (min) (ACUTE ONLY): 19 min Past Medical History: Past Medical History Diagnosis Date . Thyroid disease  . Hypertension  . Neck mass hospitalized 08/29/2015 . Type II diabetes mellitus ([place])  . Left kidney mass  . Anemia, chronic disease  Past Surgical History: Past Surgical History Procedure Laterality Date . No past surgeries   HPI: Pt is 60 y.o. female with h/o hypertension and NIDDM. Pt presented to ED with voice changes, difficult swallowing and abdominal discomfort that have progressed over the last month. 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 Subjective: pt alert, eager for food/drink Assessment / Plan / Recommendation [facility] IP CLINICAL IMPRESSIONS 09/03/2015 Therapy Diagnosis Severe pharyngeal phase dysphagia Clinical Impression Pt has a severe pharyngeal dysphaghia secondary to presence of supraglottic mass, which impedes bolus flow and decreased airway closure. All  consistencies enter the airway during the swallow even with tsp-sized boluses. Aspiration is usually sensed, but she is not able to expel aspirates from her trachea. A chin tuck paired with an immediate cough facilitates clearance through the pharynx and clearance of penetrates with all consistencies tested; however, all liquids are then re-penetrated and aspirated after the swallow. Purees do not re-enter  the airway, although aspiration risk remains due to residue. Recommend Dys 1 diet and pudding thick liquids with a chin tuck and immediate cough following all bites. Impact on safety and function Moderate aspiration risk;Risk for inadequate nutrition/hydration   [facility] IP TREATMENT RECOMMENDATION 09/03/2015 Treatment Recommendations Therapy as outlined in treatment plan below   Prognosis 09/03/2015 Prognosis for Safe Diet Advancement Guarded Barriers to Reach Goals -- Barriers/Prognosis Comment -- [facility] IP DIET RECOMMENDATION 09/03/2015 SLP Diet Recommendations Dysphagia 1 (Puree) solids;Pudding thick liquid Liquid Administration via Spoon Medication Administration Crushed with puree Compensations Slow rate;Small sips/bites;Chin tuck;Hard cough after swallow Postural Changes Remain semi-upright after after feeds/meals (Comment);Seated upright at 90 degrees   CH[facility] IP OTHER RECOMMENDATIONS 09/03/2015 Recommended Consults -- Oral Care Recommendations Oral care BID Other Recommendations Order thickener from pharmacy;Prohibited food (jello, ice cream, thin soups);Remove water pitcher   [facility] IP FOLLOW UP RECOMMENDATIONS 09/03/2015 Follow up Recommendations Outpatient SLP;24 hour supervision/assistance   [facility] IP FREQUENCY AND DURATION 09/03/2015 Speech Therapy Frequency (ACUTE ONLY) min 2x/week Treatment Duration 2 weeks      [facility]L IP ORAL PHASE 09/03/2015 Oral Phase WFL Oral - Pudding Teaspoon -- Oral - Pudding Cup -- Oral - Honey Teaspoon -- Oral - Honey Cup -- Oral - Nectar Teaspoon -- Oral - Nectar Cup -- Oral - Nectar Straw -- Oral  - Thin Teaspoon -- Oral - Thin Cup -- Oral - Thin Straw -- Oral - Puree -- Oral - Mech Soft -- Oral - Regular -- Oral - Multi-Consistency -- Oral - Pill -- Oral Phase - Comment --  [facility] IP PHARYNGEAL PHASE 09/03/2015 Pharyngeal Phase Impaired Pharyngeal- Pudding Teaspoon -- Pharyngeal -- Pharyngeal- Pudding Cup -- Pharyngeal -- Pharyngeal- Honey Teaspoon Reduced airway/laryngeal closure;Penetration/Aspiration during swallow;Penetration/Apiration after swallow;Pharyngeal residue - pyriform;Pharyngeal residue - posterior pharnyx;Compensatory strategies attempted (with notebox) Pharyngeal Material enters airway, passes BELOW cords and not ejected out despite cough attempt by patient Pharyngeal- Honey Cup -- Pharyngeal -- Pharyngeal- Nectar Teaspoon Reduced airway/laryngeal closure;Penetration/Aspiration during swallow;Penetration/Apiration after swallow;Pharyngeal residue - pyriform;Pharyngeal residue - posterior pharnyx;Compensatory strategies attempted (with notebox) Pharyngeal Material enters airway, passes BELOW cords and not ejected out despite cough attempt by patient Pharyngeal- Nectar Cup -- Pharyngeal -- Pharyngeal- Nectar Straw -- Pharyngeal -- Pharyngeal- Thin Teaspoon Reduced airway/laryngeal closure;Penetration/Aspiration during swallow;Penetration/Apiration after swallow;Pharyngeal residue - pyriform;Pharyngeal residue - posterior pharnyx;Compensatory strategies attempted (with notebox) Pharyngeal Material enters airway, passes BELOW cords and not ejected out despite cough attempt by patient Pharyngeal- Thin Cup -- Pharyngeal -- Pharyngeal- Thin Straw Reduced airway/laryngeal closure;Penetration/Aspiration during swallow;Penetration/Apiration after swallow;Pharyngeal residue - pyriform;Pharyngeal residue - posterior pharnyx;Compensatory strategies attempted (with notebox) Pharyngeal Material enters airway, passes BELOW cords and not ejected out despite cough attempt by patient Pharyngeal- Puree Reduced  airway/laryngeal closure;Penetration/Aspiration during swallow;Compensatory strategies attempted (with notebox);Pharyngeal residue - posterior pharnyx Pharyngeal Material enters airway, remains ABOVE vocal cords and not ejected out Pharyngeal- Mechanical Soft -- Pharyngeal -- Pharyngeal- Regular -- Pharyngeal -- Pharyngeal- Multi-consistency -- Pharyngeal -- Pharyngeal- Pill -- Pharyngeal -- Pharyngeal Comment --  CH[facility] IP CERVICAL ESOPHAGEAL PHASE 09/03/2015 Cervical Esophageal Phase (No Data) Pudding Teaspoon -- Pudding Cup -- Honey Teaspoon -- Honey Cup -- Nectar Teaspoon -- Nectar Cup -- Nectar Straw -- Thin Teaspoon -- Thin Cup -- Thin Straw -- Puree -- Mechanical Soft -- Regular -- Multi-consistency -- Pill -- Cervical Esophageal Comment -- No flowsheet data found. Germain Osgood, M.A. CCC-SLP [phone] Germain Osgood 09/03/2015, 1:33 PM  IMPRESSION:  Skin cell carcinoma of the larynx   PLAN:  We will continue to follow along during the patient's course of her hospitalization, and I've given her contact information and will follow-up with her what she is outpatient to determine whether or not she wishes to proceed with radiation therapy. At this point in time given her comorbidities and clinical picture, Dr. Lisbeth Renshaw would recommend palliative radiation therapy with a total of 30 grays over 10 fractions. We will me back to discuss this further if the patient is able to be optimized to the outpatient setting.   Carola Rhine, PAC

## 2015-09-05 NOTE — Progress Notes (Signed)
PATIENT DETAILS Name: Ashley Ramsey Age: 60 y.o. Sex: female Date of Birth: [birth year] Admit Date: 08/29/2015 Admitting Physician [name], MD OV:[id number], [name], MD  Brief narrative:  60 year old female with past medical history of hypertension, type 2 diabetes presented to the ED with worsening hoarseness of voice and abdominal pain. Upon further evaluation with a CT neck she was found to have a laryngeal mask, CT abdomen revealed a left renal mass. She was also found to have UTI with SIRS. She was admitted and started on IV antibiotics, ENT was consulted-patient underwent a biopsy on 1/20. Hospital course has been complicated by development of lower GI bleeding and associated blood loss anemia.   She had an episode of hematemesis on 09/04/2015 after with GI was reconsulted, ENT was called as well, ENT is now planning tracheostomy on 09/04/2015..  Subjective:  Patient in chair, currently no chest abdominal pain, no shortness of breath.  Assessment/Plan:   Probable head and neck cancer: ENT consulted, underwent laryngoscopy with biopsy on 1/20. Oncology consulted, I had detailed discussions with oncologist Dr. Alvy Bimler on 09/04/2015. Patient's prognosis is extremely poor, poor candidate for chemotherapy, also most likely has renal cancer. She recommends palliative radiation treatments and consultation with palliative care.  She was doing fairly well but in the morning of 09/04/2015 did having brisk upper GI bleed requiring multiple units of packed RBC transfusion, GI ENT were both consulted. She underwent tracheostomy placement by ENT on 09/05/2015 to secure airway.   She will be nothing by mouth for a while had dysphagia prior to trait, also no PEG tube as she has possible stomach and duodenal ulcers. We will place a PICC line for TNA.   Acute blood loss related anemia. 5 units of packed RBC transfusion on 09/04/2015, H&H now stable, GI bleed due to likely  proximal stomach and duodenal ulcer, status post EGD on 09/04/2015, IV PPI monitor H&H. GI following.     Left renal mass: Highly suspicious for renal cell carcinoma. Doubt it is related to above. Spoke with Dr. Ree Kida MD on 1/17,he reviewed patient's CT scan in chart - recommends outpatient follow-up with him in the office for consideration of nephrectomy in the future.  However patient will have a laryngeal mass addressed first with palliative radiation treatments, currently too weak for chemotherapy and nephrectomy.   Dysphagia: Secondary to known laryngeal mass. Speech therapy following-completed MBS-recommendations are for dysphagia 1 diet. Await arrival of family-will discuss with patient/family regarding risks of aspiration. She will most likely may require PEG tube.  E coli pyelonephritis with SIRS: Sensitivities noted, continue Rocephin-stop date on 1/22.  Afebrile however WBC fluctuating-suspect persistent leukocytosis from inflammation due to malignancy-rather than UTI.  Lower GI bleeding: Seems to have resolved-no further hematochezia for the past 48 hours.  Suspect Diverticular bleed. CT scan abdomen shows diverticulosis. GI recommends holding off on colonoscopy at this time due to risks of the procedure and the need to address more pressing issues. Transfused a total of 3 units so far, Hb stable today  Acute blood loss anemia: Likely secondary to hematochezia, probably has chronic anemia from chronic disease. Transfused a total of 3 units today, Hgb stable at 8.36.Follow CBC.  Acute renal failure: Likely prerenal azotemia in a setting of UTI/GI bleeding-along with lisinopril/HCTZ use.  Resolved with hydration. Continue to monitor BMP daily given acute blood loss.  Hypernatremia: Will change to D5W and monitor.  Hypokalemia: Repleted-follow periodically  Hypertension: Controlled. Continue to hold lisinopril/HCTZ.   Tobacco abuse: Counseled  Alcohol abuse: Counseled,  completed Ativan per CIWA protocol-no signs of withdrawal  Left adrenal gland nodule: Will need outpatient follow-up, especially given left adrenal mass.  Subpleural nodules in the right middle lobe of right lung and left lower lobe of left lung: Will need to repeat CT chest in 6 months. Stable on room air, no shortness of breath  Possible CAD: Atherosclerosis seen CT chest. Unfortunately with ongoing lower GI bleeding-unable to use antiplatelets. Echo with LFEV 0000000, grade 1 diastolic dysfunction, no wall motion abnormalities. Given numerous above medical problems-not a candidate for any procedures/further workup at this point.  Morbid obesity: Has lost significant amount of weight over the past few months.  Type 2 diabetes: Controlled with CBGs 110-130s. Continue SSI, hold metformin. Follow CBGs  CBG (last 3)   Recent Labs  09/04/15 1934 09/05/15 0010 09/05/15 0356  GLUCAP 145* 137* 106*     Disposition: Remains inpatient-Home in next few days  Antimicrobial agents  See below  Anti-infectives    Start     Dose/Rate Route Frequency Ordered Stop   08/29/15 2330  cefTRIAXone (ROCEPHIN) 1 g in dextrose 5 % 50 mL IVPB     1 g 100 mL/hr over 30 Minutes Intravenous Every 24 hours 08/29/15 2250        DVT Prophylaxis: SCD's, no pharmacological DVT ppx given GI bleed/anemia  Code Status: Full code   Family Communication Daughter  Procedures:  ECHO- TTE  - Left ventricle: The cavity size was normal. Wall thickness was normal. Systolic function was normal. The estimated ejectionfraction was in the range of 55% to 60%. Wall motion was normal;there were no regional wall motion abnormalities. Dopplerparameters are consistent with abnormal left ventricularrelaxation (grade 1 diastolic dysfunction). Doppler parametersare consistent with high ventricular filling pressure. - Aortic valve: There was very mild stenosis. There was trivialregurgitation. - Mitral valve:  Calcified annulus. Mildly thickened leaflets . - Left atrium: The atrium was mildly dilated.  Tracheostomy 09/03/2014 by ENT  PICC requested 09/05/2015  EGD 09/04/2015 by Dr. Havery Moros. With possible ulcers in the duodenum and proximal stomach. Blood and clots also noted.     CONSULTS:  GI, urology ,  ent, oncology, palliative care  Time spent 30 minutes-Greater than 50% of this time was spent in counseling, explanation of diagnosis, planning of further management, and coordination of care.  MEDICATIONS: Scheduled Meds: . sodium chloride   Intravenous Once  . cefTRIAXone (ROCEPHIN)  IV  1 g Intravenous Q24H  . folic acid  1 mg Oral Daily  . Influenza vac split quadrivalent PF  0.5 mL Intramuscular Tomorrow-1000  . insulin aspart  0-9 Units Subcutaneous TID WC  . loratadine  10 mg Oral Daily  . magnesium sulfate 1 - 4 g bolus IVPB  2 g Intravenous Once  . multivitamin with minerals  1 tablet Oral Daily  . thiamine  100 mg Intravenous Daily   Continuous Infusions: . dextrose    . lactated ringers 10 mL/hr at 09/02/15 1128  . lactated ringers Stopped (09/04/15 1800)  . pantoprozole (PROTONIX) infusion 8 mg/hr (09/05/15 0507)   PRN Meds:.acetaminophen, dextromethorphan, lidocaine, morphine injection, morphine injection, ondansetron **OR** ondansetron (ZOFRAN) IV, oxymetazoline, RESOURCE THICKENUP CLEAR   PHYSICAL EXAM: Vital signs in last 24 hours: Filed Vitals:   09/05/15 0500 09/05/15 0600 09/05/15 0824 09/05/15 0840  BP: 130/73 135/73    Pulse: 84 90  87  Temp:  99 F (37.2 C)   TempSrc:   Oral   Resp: 22 20  21   Height:      Weight:      SpO2: 98% 100%  100%    Weight change:  Filed Weights   08/29/15 1422  Weight: 113.399 kg (250 lb)   Body mass index is 44.3 kg/(m^2).   Gen Exam: Awake and alert with clear speech. Obese.   Neck: Supple-exam limited by body habitus. No stridor, Trach in place Chest:  CVS: S1 S2 regular. Abdomen: soft, non  tender Extremities:  Neurologic: Non Focal Psych: cooperative Wounds: N/A.   Intake/Output from previous day:  Intake/Output Summary (Last 24 hours) at 09/05/15 0856 Last data filed at 09/05/15 0600  Gross per 24 hour  Intake   6573 ml  Output    120 ml  Net   6453 ml     LAB RESULTS: CBC  Recent Labs Lab 09/02/15 1946 09/03/15 0530 09/04/15 0325 09/04/15 1504 09/04/15 2105 09/05/15 0350  WBC 18.5* 21.0* 18.1* 18.5*  --  19.1*  HGB 8.2* 8.6* 6.6* 5.1*  5.6* 10.1* 9.6*  HCT 25.0* 25.7* 20.7* 15.0*  16.8* 29.4* 27.8*  PLT 202 210 196 140*  --  103*  MCV 83.9 84.3 86.6 87.5  --  83.2  MCH 27.5 28.2 27.6 29.2  --  28.7  MCHC 32.8 33.5 31.9 33.3  --  34.5  RDW 14.8 15.1 15.4 14.8  --  14.9  LYMPHSABS  --   --  1.3  --   --   --   MONOABS  --   --  0.7  --   --   --   EOSABS  --   --  0.1  --   --   --   BASOSABS  --   --  0.0  --   --   --     Chemistries   Recent Labs Lab 08/30/15 0142 08/31/15 1028 09/01/15 0511 09/02/15 0547 09/04/15 0325 09/04/15 1504 09/05/15 0350  NA 137 144 148* 146* 146* 146* 145  K 2.9* 3.9 3.8 3.6 3.7 3.5 3.8  CL 96* 113* 116* 112* 116*  --  115*  CO2 31 24 27 24 23   --  25  GLUCOSE 165* 174* 127* 101* 216*  --  127*  BUN 56* 36* 28* 22* 17  --  21*  CREATININE 1.46* 0.88 0.84 0.82 0.89  --  0.96  CALCIUM 8.3* 8.6* 8.2* 8.4* 7.9*  --  7.1*  MG 1.5* 1.8  --   --   --   --  1.1*    CBG:  Recent Labs Lab 09/04/15 0757 09/04/15 1700 09/04/15 1934 09/05/15 0010 09/05/15 0356  GLUCAP 133* 184* 145* 137* 106*    GFR Estimated Creatinine Clearance: 76.5 mL/min (by C-G formula based on Cr of 0.96).  Coagulation profile  Recent Labs Lab 09/04/15 1504  INR 1.34    Cardiac Enzymes No results for input(s): CKMB, TROPONINI, MYOGLOBIN in the last 168 hours.  Invalid input(s): CK  Invalid input(s): POCBNP No results for input(s): DDIMER in the last 72 hours. No results for input(s): HGBA1C in the last 72 hours. No  results for input(s): CHOL, HDL, LDLCALC, TRIG, CHOLHDL, LDLDIRECT in the last 72 hours. No results for input(s): TSH, T4TOTAL, T3FREE, THYROIDAB in the last 72 hours.  Invalid input(s): FREET3 No results for input(s): VITAMINB12, FOLATE, FERRITIN, TIBC, IRON, RETICCTPCT in the last 72 hours. No results for input(s):  LIPASE, AMYLASE in the last 72 hours.  Urine Studies No results for input(s): UHGB, CRYS in the last 72 hours.  Invalid input(s): UACOL, UAPR, USPG, UPH, UTP, UGL, UKET, UBIL, UNIT, UROB, ULEU, UEPI, UWBC, URBC, UBAC, CAST, UCOM, BILUA  MICROBIOLOGY: Recent Results (from the past 240 hour(s))  Culture, Urine     Status: None   Collection Time: 08/29/15  9:09 PM  Result Value Ref Range Status   Specimen Description URINE, CATHETERIZED  Final   Special Requests NONE  Final   Culture >=100,000 COLONIES/mL ESCHERICHIA COLI  Final   Report Status 09/01/2015 FINAL  Final   Organism ID, Bacteria ESCHERICHIA COLI  Final      Susceptibility   Escherichia coli - MIC*    AMPICILLIN >=32 RESISTANT Resistant     CEFAZOLIN <=4 SENSITIVE Sensitive     CEFTRIAXONE <=1 SENSITIVE Sensitive     CIPROFLOXACIN <=0.25 SENSITIVE Sensitive     GENTAMICIN <=1 SENSITIVE Sensitive     IMIPENEM <=0.25 SENSITIVE Sensitive     NITROFURANTOIN <=16 SENSITIVE Sensitive     TRIMETH/SULFA <=20 SENSITIVE Sensitive     AMPICILLIN/SULBACTAM 4 SENSITIVE Sensitive     PIP/TAZO <=4 SENSITIVE Sensitive     * >=100,000 COLONIES/mL ESCHERICHIA COLI  Surgical pcr screen     Status: None   Collection Time: 09/01/15  4:09 AM  Result Value Ref Range Status   MRSA, PCR NEGATIVE NEGATIVE Final   Staphylococcus aureus NEGATIVE NEGATIVE Final    Comment:        The Xpert SA Assay (FDA approved for NASAL specimens in patients over 24 years of age), is one component of a comprehensive surveillance program.  Test performance has been validated by [facility] for patients greater than or equal to 69 year  old. It is not intended to diagnose infection nor to guide or monitor treatment.   MRSA PCR Screening     Status: None   Collection Time: 09/04/15  7:03 PM  Result Value Ref Range Status   MRSA by PCR NEGATIVE NEGATIVE Final    Comment:        The GeneXpert MRSA Assay (FDA approved for NASAL specimens only), is one component of a comprehensive MRSA colonization surveillance program. It is not intended to diagnose MRSA infection nor to guide or monitor treatment for MRSA infections.     RADIOLOGY STUDIES/RESULTS: Dg Chest 2 View  08/29/2015  CLINICAL DATA:  One month history of chest pain and hoarse voice. EXAM: CHEST  2 VIEW COMPARISON:  None. FINDINGS: The heart is borderline and enlarged. Mild tortuosity of the thoracic aorta. Low lung volumes with mild vascular crowding and streaky basilar atelectasis. There also bronchitic changes which could be acute or chronic. No infiltrates or effusions. The bony thorax is intact. IMPRESSION: Acute versus chronic bronchitic change.  No infiltrates or effusions Electronically Signed   By: Marijo Sanes M.D.   On: 08/29/2015 16:15   Ct Soft Tissue Neck W Contrast  08/29/2015  CLINICAL DATA:  60 year old female with abnormal full waist for 1 month. Abdominal pain. Three days of vomiting. Initial encounter. EXAM: CT NECK WITH CONTRAST TECHNIQUE: Multidetector CT imaging of the neck was performed using the standard protocol following the bolus administration of intravenous contrast. CONTRAST:  60mL OMNIPAQUE IOHEXOL 300 MG/ML SOLN in conjunction with contrast enhanced imaging of the chest, abdomen, and pelvis reported separately. COMPARISON:  CT chest abdomen and pelvis from today reported separately FINDINGS: Pharynx and larynx: Bulky supraglottic laryngeal tumor  with marked soft tissue enlargement of the bilateral aryepiglottic folds and anterior commissure up to 17 mm in thickness. Tumor appears inseparable from the undersurface of the strap muscles  suggesting extension through the thyroid cartilage bilaterally. Posterior hypopharynx involvement. The epiglottis is thickened, nodular, and distorted. Furthermore, the vallecula is mostly effaced and soft tissue at the base of tongue appears nodular and thickened. All told, tumor encompasses 37 x 40 x 58 mm (AP by transverse by CC). The other laryngeal cartilages appear spared. The palatine tonsils, soft palate, and nasopharynx appear within normal limits. Superior parapharyngeal spaces and retropharyngeal space are within normal limits. Salivary glands: Sublingual space, submandibular glands, and parotid glands are within normal limits. Thyroid: Coarsely calcified 2.3 cm right thyroid nodule. Subcentimeter left lobe nodule. Lymph nodes: Abnormal right level 3 node measuring 9 mm at the level of the thyroid cartilage (series 1, image 57). Small but asymmetric 5 mm right level IIIa lymph node at the level of the hyoid bone on the right (series 1, image 49). Bilateral level 2A nodes appear symmetric measuring 8-9 mm in thickness. No level 4, 5, or level 1 lymphadenopathy. Vascular: Suboptimal intravascular contrast timing, but major vascular structures in the neck and at the skullbase appear to remain patent. Left greater than right carotid bifurcation calcified atherosclerosis. Limited intracranial: Negative.  Partially empty sella. Visualized orbits: Negative. Mastoids and visualized paranasal sinuses: Visualized paranasal sinuses and mastoids are clear. Skeleton: Mostly absent dentition. Periapical lucency about the residual left mandible molar. No osseous metastatic disease identified. Upper chest: Reported separately today. IMPRESSION: 1. 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 2. Suspect metastatic nodal disease at level 3 on the right (small but asymmetric up to 9 mm nodes). Bilateral level 2 nodes are indeterminate  and also measure up to 9 mm individually. No cystic or necrotic nodes in the neck. 3. See CT chest abdomen and pelvis from today reported separately. Electronically Signed   By: Genevie Ann M.D.   On: 08/29/2015 19:56   Ct Chest W Contrast  08/29/2015  CLINICAL DATA:  60 year old female with 2-3 day history of vomiting. Abdominal pain for the past month. Loss of voice. EXAM: CT CHEST, ABDOMEN, AND PELVIS WITH CONTRAST TECHNIQUE: Multidetector CT imaging of the chest, abdomen and pelvis was performed following the standard protocol during bolus administration of intravenous contrast. CONTRAST:  27mL OMNIPAQUE IOHEXOL 300 MG/ML  SOLN COMPARISON:  No priors. FINDINGS: CT CHEST FINDINGS Mediastinum/Lymph Nodes: Heart size is normal. There is no significant pericardial fluid, thickening or pericardial calcification. There is atherosclerosis of the thoracic aorta, the great vessels of the mediastinum and the coronary arteries, including calcified atherosclerotic plaque in the left main, left anterior descending, left circumflex and right coronary arteries. Calcifications of the aortic valve and mitral valve/annulus. No pathologically enlarged mediastinal or hilar lymph nodes. Esophagus is unremarkable in appearance. No axillary lymphadenopathy. Lungs/Pleura: 4 mm subpleural nodule in the lateral segment of the right middle lobe (image 31 of series 4). 4 mm subpleural nodule in the posterior left lower lobe (image 38 of series 4). No other suspicious appearing pulmonary nodules or masses. No acute consolidative airspace disease. No pleural effusions. Musculoskeletal/Soft Tissues: There are no aggressive appearing lytic or blastic lesions noted in the visualized portions of the skeleton. CT ABDOMEN AND PELVIS FINDINGS Hepatobiliary: No cystic or solid hepatic lesions. No intra or extrahepatic biliary ductal dilatation. 7 mm calcified gallstone lying  dependently in the gallbladder. No current findings to suggest an acute  cholecystitis at this time. Pancreas: No pancreatic mass. No pancreatic ductal dilatation. No pancreatic or peripancreatic fluid or inflammatory changes. Spleen: Unremarkable. Adrenals/Urinary Tract: In the lower pole of the left kidney there is a 5.9 x 6.1 x 6.3 cm heterogeneously enhancing lesion highly concerning for renal cell carcinoma. The inferior aspect of this lesion comes in very close proximity to the anterior aspect of the left psoas muscle and quadratus lumborum muscle, however, there appears to be an intervening fat plane at this time. The lesion is well separated from the left renal vein. Right kidney and right adrenal gland are normal in appearance. 1.2 x 1.4 cm indeterminate nodule in the lateral limb of the left adrenal gland. No hydroureteronephrosis. Urinary bladder is largely decompressed, but otherwise unremarkable in appearance. Stomach/Bowel: Normal appearance of the stomach. No pathologic dilatation of small bowel or colon. A few scattered colonic diverticulae are noted, without surrounding inflammatory changes to suggest an acute diverticulitis at this time. Appendix is normal. Vascular/Lymphatic: Atherosclerosis throughout the abdominal and pelvic vasculature, without evidence of aneurysm or dissection. Left renal vein is widely patent and separate from the lower pole mass. No lymphadenopathy noted in the abdomen or pelvis. Reproductive: Uterus and ovaries are unremarkable in appearance. Other: No significant volume of ascites.  No pneumoperitoneum. Musculoskeletal: There are no aggressive appearing lytic or blastic lesions noted in the visualized portions of the skeleton. IMPRESSION: 1. No acute findings in the abdomen or pelvis to account for the patient's symptoms. 2. However, there is a 5.9 x 6.1 x 6.3 cm heterogeneously enhancing mass in the lower pole of the left kidney highly concerning for renal cell carcinoma. At this time, the lesion appears likely to be encapsulated within  Gerota's fascia (although it comes very close to the left psoas and quadratus lumborum musculature), does not involve the left renal vein, and does not appear to be associated with lymphadenopathy. Nonemergent Urologic consultation for surgical resection is strongly recommended in the near future. 3. 1.2 x 1.4 cm indeterminate nodule in the left adrenal gland. Attention on followup studies is recommended, as a metastatic lesion is not excluded. 4. 4 mm subpleural nodules in the right middle lobe and left lower lobe. These are highly nonspecific, and favored to represent subpleural lymph nodes, but attention on followup studies is recommended to ensure stability. 5. Cholelithiasis without evidence of acute cholecystitis at this time. 6. Atherosclerosis, including left main and 3 vessel coronary artery disease. Please note that although the presence of coronary artery calcium documents the presence of coronary artery disease, the severity of this disease and any potential stenosis cannot be assessed on this non-gated CT examination. Assessment for potential risk factor modification, dietary therapy or pharmacologic therapy may be warranted, if clinically indicated. 7. There are calcifications of the aortic valve and mitral valve/annulus. Echocardiographic correlation for evaluation of potential valvular dysfunction may be warranted if clinically indicated. 8. Colonic diverticulosis without evidence of acute diverticulitis at this time. 9. Additional incidental findings, as above. These results were called by telephone at the time of interpretation on 08/29/2015 at 7:34 pm to Dr. Davonna Belling, who verbally acknowledged these results. Electronically Signed   By: Vinnie Langton M.D.   On: 08/29/2015 19:37   Ct Abdomen Pelvis W Contrast  08/29/2015  CLINICAL DATA:  60 year old female with 2-3 day history of vomiting. Abdominal pain for the past month. Loss of voice. EXAM: CT CHEST, ABDOMEN, AND PELVIS  WITH  CONTRAST TECHNIQUE: Multidetector CT imaging of the chest, abdomen and pelvis was performed following the standard protocol during bolus administration of intravenous contrast. CONTRAST:  33mL OMNIPAQUE IOHEXOL 300 MG/ML  SOLN COMPARISON:  No priors. FINDINGS: CT CHEST FINDINGS Mediastinum/Lymph Nodes: Heart size is normal. There is no significant pericardial fluid, thickening or pericardial calcification. There is atherosclerosis of the thoracic aorta, the great vessels of the mediastinum and the coronary arteries, including calcified atherosclerotic plaque in the left main, left anterior descending, left circumflex and right coronary arteries. Calcifications of the aortic valve and mitral valve/annulus. No pathologically enlarged mediastinal or hilar lymph nodes. Esophagus is unremarkable in appearance. No axillary lymphadenopathy. Lungs/Pleura: 4 mm subpleural nodule in the lateral segment of the right middle lobe (image 31 of series 4). 4 mm subpleural nodule in the posterior left lower lobe (image 38 of series 4). No other suspicious appearing pulmonary nodules or masses. No acute consolidative airspace disease. No pleural effusions. Musculoskeletal/Soft Tissues: There are no aggressive appearing lytic or blastic lesions noted in the visualized portions of the skeleton. CT ABDOMEN AND PELVIS FINDINGS Hepatobiliary: No cystic or solid hepatic lesions. No intra or extrahepatic biliary ductal dilatation. 7 mm calcified gallstone lying dependently in the gallbladder. No current findings to suggest an acute cholecystitis at this time. Pancreas: No pancreatic mass. No pancreatic ductal dilatation. No pancreatic or peripancreatic fluid or inflammatory changes. Spleen: Unremarkable. Adrenals/Urinary Tract: In the lower pole of the left kidney there is a 5.9 x 6.1 x 6.3 cm heterogeneously enhancing lesion highly concerning for renal cell carcinoma. The inferior aspect of this lesion comes in very close proximity to the  anterior aspect of the left psoas muscle and quadratus lumborum muscle, however, there appears to be an intervening fat plane at this time. The lesion is well separated from the left renal vein. Right kidney and right adrenal gland are normal in appearance. 1.2 x 1.4 cm indeterminate nodule in the lateral limb of the left adrenal gland. No hydroureteronephrosis. Urinary bladder is largely decompressed, but otherwise unremarkable in appearance. Stomach/Bowel: Normal appearance of the stomach. No pathologic dilatation of small bowel or colon. A few scattered colonic diverticulae are noted, without surrounding inflammatory changes to suggest an acute diverticulitis at this time. Appendix is normal. Vascular/Lymphatic: Atherosclerosis throughout the abdominal and pelvic vasculature, without evidence of aneurysm or dissection. Left renal vein is widely patent and separate from the lower pole mass. No lymphadenopathy noted in the abdomen or pelvis. Reproductive: Uterus and ovaries are unremarkable in appearance. Other: No significant volume of ascites.  No pneumoperitoneum. Musculoskeletal: There are no aggressive appearing lytic or blastic lesions noted in the visualized portions of the skeleton. IMPRESSION: 1. No acute findings in the abdomen or pelvis to account for the patient's symptoms. 2. However, there is a 5.9 x 6.1 x 6.3 cm heterogeneously enhancing mass in the lower pole of the left kidney highly concerning for renal cell carcinoma. At this time, the lesion appears likely to be encapsulated within Gerota's fascia (although it comes very close to the left psoas and quadratus lumborum musculature), does not involve the left renal vein, and does not appear to be associated with lymphadenopathy. Nonemergent Urologic consultation for surgical resection is strongly recommended in the near future. 3. 1.2 x 1.4 cm indeterminate nodule in the left adrenal gland. Attention on followup studies is recommended, as a  metastatic lesion is not excluded. 4. 4 mm subpleural nodules in the right middle lobe and left lower lobe. These  are highly nonspecific, and favored to represent subpleural lymph nodes, but attention on followup studies is recommended to ensure stability. 5. Cholelithiasis without evidence of acute cholecystitis at this time. 6. Atherosclerosis, including left main and 3 vessel coronary artery disease. Please note that although the presence of coronary artery calcium documents the presence of coronary artery disease, the severity of this disease and any potential stenosis cannot be assessed on this non-gated CT examination. Assessment for potential risk factor modification, dietary therapy or pharmacologic therapy may be warranted, if clinically indicated. 7. There are calcifications of the aortic valve and mitral valve/annulus. Echocardiographic correlation for evaluation of potential valvular dysfunction may be warranted if clinically indicated. 8. Colonic diverticulosis without evidence of acute diverticulitis at this time. 9. Additional incidental findings, as above. These results were called by telephone at the time of interpretation on 08/29/2015 at 7:34 pm to Dr. Davonna Belling, who verbally acknowledged these results. Electronically Signed   By: Vinnie Langton M.D.   On: 08/29/2015 19:37   Dg Chest Port 1 View  09/04/2015  CLINICAL DATA:  Central line placement. EXAM: PORTABLE CHEST 1 VIEW COMPARISON:  08/29/2015 FINDINGS: Endotracheal tube terminates 6 cm above the carina. Right internal jugular approach central venous catheter seen with tip at the expected location of superior vena cava. Cardiomediastinal silhouette is enlarged, likely exaggerated by portable technique and rotation. Mediastinal contours appear intact. There is no evidence of focal airspace consolidation, pleural effusion or pneumothorax. Lung volumes are low. Osseous structures are without acute abnormality. Soft tissues are grossly  normal. IMPRESSION: Low lung volumes. Endotracheal tube 6 cm above the carina. Advancement with 2 cm may be considered. Right-sided central venous catheter in satisfactory position radiographically. Electronically Signed   By: Fidela Salisbury M.D.   On: 09/04/2015 16:06   Dg Swallowing Func-speech Pathology  09/03/2015  Objective Swallowing Evaluation:   Patient Details Name: Ashley Ramsey MRN: [id number] Date of Birth: [birth year] Today's Date: 09/03/2015 Time: SLP Start Time (ACUTE ONLY): 1124-SLP Stop Time (ACUTE ONLY): 1143 SLP Time Calculation (min) (ACUTE ONLY): 19 min Past Medical History: Past Medical History Diagnosis Date . Thyroid disease  . Hypertension  . Neck mass hospitalized 08/29/2015 . Type II diabetes mellitus ([place])  . Left kidney mass  . Anemia, chronic disease  Past Surgical History: Past Surgical History Procedure Laterality Date . No past surgeries   HPI: Pt is 60 y.o. female with h/o hypertension and NIDDM. Pt presented to ED with voice changes, difficult swallowing and abdominal discomfort that have progressed over the last month. 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 Subjective: pt alert, eager for food/drink Assessment / Plan / Recommendation CHL IP CLINICAL IMPRESSIONS 09/03/2015 Therapy Diagnosis Severe pharyngeal phase dysphagia Clinical Impression Pt has a severe pharyngeal dysphaghia secondary to presence of supraglottic mass, which impedes bolus flow and decreased airway closure. All consistencies enter the airway during the swallow even with tsp-sized boluses. Aspiration is usually sensed, but she is not able to expel aspirates from her trachea. A chin tuck paired with an immediate cough facilitates clearance through the pharynx and clearance of penetrates with all consistencies tested; however, all liquids are then re-penetrated and aspirated after the swallow. Purees do not re-enter the airway, although aspiration risk  remains due to residue. Recommend Dys 1 diet and pudding thick liquids with a chin tuck and immediate cough following all bites. Impact on safety and function Moderate aspiration risk;Risk for inadequate nutrition/hydration  CHL IP TREATMENT RECOMMENDATION 09/03/2015 Treatment Recommendations Therapy as outlined in treatment plan below   Prognosis 09/03/2015 Prognosis for Safe Diet Advancement Guarded Barriers to Reach Goals -- Barriers/Prognosis Comment -- CHL IP DIET RECOMMENDATION 09/03/2015 SLP Diet Recommendations Dysphagia 1 (Puree) solids;Pudding thick liquid Liquid Administration via Spoon Medication Administration Crushed with puree Compensations Slow rate;Small sips/bites;Chin tuck;Hard cough after swallow Postural Changes Remain semi-upright after after feeds/meals (Comment);Seated upright at 90 degrees   CHL IP OTHER RECOMMENDATIONS 09/03/2015 Recommended Consults -- Oral Care Recommendations Oral care BID Other Recommendations Order thickener from pharmacy;Prohibited food (jello, ice cream, thin soups);Remove water pitcher   CHL IP FOLLOW UP RECOMMENDATIONS 09/03/2015 Follow up Recommendations Outpatient SLP;24 hour supervision/assistance   CHL IP FREQUENCY AND DURATION 09/03/2015 Speech Therapy Frequency (ACUTE ONLY) min 2x/week Treatment Duration 2 weeks      CHL IP ORAL PHASE 09/03/2015 Oral Phase WFL Oral - Pudding Teaspoon -- Oral - Pudding Cup -- Oral - Honey Teaspoon -- Oral - Honey Cup -- Oral - Nectar Teaspoon -- Oral - Nectar Cup -- Oral - Nectar Straw -- Oral - Thin Teaspoon -- Oral - Thin Cup -- Oral - Thin Straw -- Oral - Puree -- Oral - Mech Soft -- Oral - Regular -- Oral - Multi-Consistency -- Oral - Pill -- Oral Phase - Comment --  CHL IP PHARYNGEAL PHASE 09/03/2015 Pharyngeal Phase Impaired Pharyngeal- Pudding Teaspoon -- Pharyngeal -- Pharyngeal- Pudding Cup -- Pharyngeal -- Pharyngeal- Honey Teaspoon Reduced airway/laryngeal closure;Penetration/Aspiration during swallow;Penetration/Apiration  after swallow;Pharyngeal residue - pyriform;Pharyngeal residue - posterior pharnyx;Compensatory strategies attempted (with notebox) Pharyngeal Material enters airway, passes BELOW cords and not ejected out despite cough attempt by patient Pharyngeal- Honey Cup -- Pharyngeal -- Pharyngeal- Nectar Teaspoon Reduced airway/laryngeal closure;Penetration/Aspiration during swallow;Penetration/Apiration after swallow;Pharyngeal residue - pyriform;Pharyngeal residue - posterior pharnyx;Compensatory strategies attempted (with notebox) Pharyngeal Material enters airway, passes BELOW cords and not ejected out despite cough attempt by patient Pharyngeal- Nectar Cup -- Pharyngeal -- Pharyngeal- Nectar Straw -- Pharyngeal -- Pharyngeal- Thin Teaspoon Reduced airway/laryngeal closure;Penetration/Aspiration during swallow;Penetration/Apiration after swallow;Pharyngeal residue - pyriform;Pharyngeal residue - posterior pharnyx;Compensatory strategies attempted (with notebox) Pharyngeal Material enters airway, passes BELOW cords and not ejected out despite cough attempt by patient Pharyngeal- Thin Cup -- Pharyngeal -- Pharyngeal- Thin Straw Reduced airway/laryngeal closure;Penetration/Aspiration during swallow;Penetration/Apiration after swallow;Pharyngeal residue - pyriform;Pharyngeal residue - posterior pharnyx;Compensatory strategies attempted (with notebox) Pharyngeal Material enters airway, passes BELOW cords and not ejected out despite cough attempt by patient Pharyngeal- Puree Reduced airway/laryngeal closure;Penetration/Aspiration during swallow;Compensatory strategies attempted (with notebox);Pharyngeal residue - posterior pharnyx Pharyngeal Material enters airway, remains ABOVE vocal cords and not ejected out Pharyngeal- Mechanical Soft -- Pharyngeal -- Pharyngeal- Regular -- Pharyngeal -- Pharyngeal- Multi-consistency -- Pharyngeal -- Pharyngeal- Pill -- Pharyngeal -- Pharyngeal Comment --  CHL IP CERVICAL ESOPHAGEAL PHASE  09/03/2015 Cervical Esophageal Phase (No Data) Pudding Teaspoon -- Pudding Cup -- Honey Teaspoon -- Honey Cup -- Nectar Teaspoon -- Nectar Cup -- Nectar Straw -- Thin Teaspoon -- Thin Cup -- Thin Straw -- Puree -- Mechanical Soft -- Regular -- Multi-consistency -- Pill -- Cervical Esophageal Comment -- No flowsheet data found. Germain Osgood, M.A. CCC-SLP [phone] Germain Osgood 09/03/2015, 1:33 PM               Signature  Thurnell Lose M.D on 09/05/2015 at 9:04 AM  Between 7am to 7pm - Pager - [phone], After 7pm go to [email or web address] - password [email or web address]  [phone]   LOS: 6 days

## 2015-09-05 NOTE — Op Note (Signed)
[facility] [place], [address]   ENDOSCOPY PROCEDURE REPORT  PATIENT: [name], [name]  MR#: [id number] BIRTHDATE: 1955/09/06 , 25  yrs. old GENDER: female ENDOSCOPIST: Yetta Flock, MD REFERRED BY: PROCEDURE DATE:  09/04/2015 PROCEDURE:  EGD w/ control of bleeding ASA CLASS:     Class III INDICATIONS:   hematemesis and melena, h/o laryngeal mass. MEDICATIONS: Fentanyl 25 mcg IV and Versed 3 mg IV TOPICAL ANESTHETIC:  DESCRIPTION OF PROCEDURE: After the risks benefits and alternatives of the procedure were thoroughly explained, informed consent was obtained.  The Pentax Gastroscope [id number] endoscope was introduced through the mouth and advanced to the second portion of the duodenum , Without limitations.  The instrument was slowly withdrawn as the mucosa was fully examined.       FINDINGS: The esophagus was normal.  DH, GEJ, and SCJ located 40cm from the incisors.  There was a large amount of blood clot in the fundus and gastric body which could not be cleared.  There was old blood noted in the antrum and body but no evidence of pathology noted.  There was a very large duodenal bulb ulcer that extended into the duodenal sweep.  It was at least a few cm in size, and clean based with exception of a protuberance along the sweep with a puplish colored ? vessel with some ongoing oozing.  2 hemostasis clips were placed across the protuberance in the areas of the suspected vessel.  There was no active bleeding following this intervention.  There was no evidence of blood noted in the 2nd portion of the duodenum.  Retroflexed views revealed as previously described.     The scope was then withdrawn from the patient and the procedure completed.  COMPLICATIONS: There were no immediate complications.  ENDOSCOPIC IMPRESSION: Normal esophagus Significant amount of blood clot in the gastric fundus, cardia, and proximal body - this part  of the stomach could not be cleared Large duodenal ulcer with stigmata of bleeding as outlined above, 2 hemostasis clips placed across this area Normal 2nd portion of the examined duodenum without active bleeding  RECOMMENDATIONS: Continue NPO status Continue IV protonix drip H pylori IgG, treat if positive Continue PRBC transfusion and trend H/H, if she has evidence of ongoing active GI bleeding, please consult IR for embolization / tagged RBC scan. Could not clear proximal stomach due to blood clot, although suspect the source is the large ulcer visualizaed in the duodenal bulb GI service will continue to follow    eSigned:  Yetta Flock, MD 09/04/2015 8:45 PM    CC:  PATIENT NAME:  Ashley, Ramsey MR#: [id number]

## 2015-09-05 NOTE — Progress Notes (Signed)
Patient ID: Ashley Ramsey, female   DOB: Nov 21, 1955, 60 y.o.   MRN: [id number]  GI pathology identified and undergoing treatment.  Awake and alert, breathing well. Tracheostomy in place and stable. The cuff was deflated. She tolerated this well.  Laryngeal pathology pending.  She may be transferred back to the floor whenever appropriate, stable as far as the tracheostomy is concerned. We will discuss treatment plan for the laryngeal disease when the pathology returns.

## 2015-09-05 NOTE — Progress Notes (Signed)
Initial Nutrition Assessment  DOCUMENTATION CODES:   Morbid obesity  INTERVENTION:    TPN dosing per Pharmacy to meet nutrition needs as able.  NUTRITION DIAGNOSIS:   Inadequate oral intake related to inability to eat as evidenced by NPO status.  GOAL:   Patient will meet greater than or equal to 90% of their needs  MONITOR:   Labs, Weight trends, I & O's  REASON FOR ASSESSMENT:   Consult New TPN/TNA  ASSESSMENT:   60yo female with hx HTN, DM who initially presented 1/16 with hoarseness and difficulty swallowing. 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 She was seen in consultation by ENT and underwent tracheostomy placement 1/22. Course was c/b GI bleed. EGD performed on 1/22 revealed large duodenal ulcer which was actively bleeding and was clipped. She remains in ICU post EGD and trach placement.  Labs reviewed: magnesium low.   Patient was seen by SLP on 1/21, recommended Dysphagia 1 diet with pudding thick liquids. Patient will likely need a PEG eventually. No plans to start enteral nutrition now due to ongoing GI issues, bleeding duodenal ulcer, laryngeal mass. Plans to start TPN via PICC today. Unable to speak with patient or complete nutrition focused physical exam at this time. Patient with recent difficulty swallowing, poor appetite, poor intake, weight loss PTA. Unsure amount of weight loss.  Diet Order:  Diet NPO time specified Except for: Sips with Meds TPN (CLINIMIX-E) Adult  Skin:  Reviewed, no issues  Last BM:  1/23  Height:   Ht Readings from Last 1 Encounters:  08/29/15 5\' 3"  (1.6 m)    Weight:   Wt Readings from Last 1 Encounters:  08/29/15 250 lb (113.399 kg)    Ideal Body Weight:  52.3 kg  BMI:  Body mass index is 44.3 kg/(m^2).  Estimated Nutritional Needs:   Kcal:  1700-2000  Protein:  105-120 gm  Fluid:  1.7-2 L  EDUCATION NEEDS:   No education needs identified at  this time  Molli Barrows, [place], [place], [place] Pager [phone] After Hours Pager [phone]

## 2015-09-05 NOTE — Progress Notes (Signed)
Patient ID: Ashley Ramsey, female   DOB: 05-07-1956, 60 y.o.   MRN: [id number]   Pt admitted with laryngeal and renal mass  Developed GI bleed secondary duodenal ulcer  EGD and clipping 09/04/15  No further bleeding incidents per CCM; GI MD  Dr Kathlene Cote was consulted to be made aware of pt if would need embolization in case of re bleed Stable now H/H stable 9.6/27.8  I have spoken to pt and family We are aware of her and case If needs we will be available  Aware and agreeable

## 2015-09-05 NOTE — Progress Notes (Signed)
UNASSIGNED PATINET Subjective: Ashley Ramsey is a 60 year old black female, with a history of HTN and AODM, presented on 08/29/15 with hoarseness and worsening abdominal pain. She is presently sitting up in bed. She seems alert and has a tracheostomy she denies having any abdominal pain or chest pain. As per her nurse was in the room with her she's had some dark stools but there has been no further evidence of bleeding.  Objective: Vital signs in last 24 hours: Temp:  [97.3 F (36.3 C)-99.7 F (37.6 C)] 99.7 F (37.6 C) (01/23 1554) Pulse Rate:  [82-112] 103 (01/23 1700) Resp:  [0-30] 5 (01/23 1700) BP: (92-156)/(43-103) 146/81 mmHg (01/23 1600) SpO2:  [97 %-100 %] 100 % (01/23 1700) Arterial Line BP: (97-190)/(51-162) 151/74 mmHg (01/23 1700) FiO2 (%):  [28 %] 28 % (01/23 1600) Last BM Date: 09/05/15  Intake/Output from previous day: 01/22 0701 - 01/23 0700 In: 6718 [I.V.:4595; II:2587103; IV Piggyback:50] Out: 120 [Urine:100; Blood:20] Intake/Output this shift: Total I/O In: 960 [I.V.:860; IV Piggyback:100] Out: -   General appearance: alert, cooperative, appears stated age, no distress and toxic Resp: clear to auscultation bilaterally Cardio: regular rate and rhythm, S1, S2 normal, no murmur, click, rub or gallop GI: soft, non-tender; bowel sounds normal; no masses,  no organomegaly  Lab Results:  Recent Labs  09/04/15 1504 09/04/15 2105 09/05/15 0350 09/05/15 1159  WBC 18.5*  --  19.1* 20.3*  HGB 5.1*  5.6* 10.1* 9.6* 10.6*  HCT 15.0*  16.8* 29.4* 27.8* 30.5*  PLT 140*  --  103* 113*   BMET  Recent Labs  09/04/15 0325 09/04/15 1504 09/05/15 0350  NA 146* 146* 145  K 3.7 3.5 3.8  CL 116*  --  115*  CO2 23  --  25  GLUCOSE 216*  --  127*  BUN 17  --  21*  CREATININE 0.89  --  0.96  CALCIUM 7.9*  --  7.1*   PT/INR  Recent Labs  09/04/15 1504  LABPROT 16.7*  INR 1.34   Studies/Results: Dg Chest Port 1 View  09/04/2015  CLINICAL DATA:  Central line  placement. EXAM: PORTABLE CHEST 1 VIEW COMPARISON:  08/29/2015 FINDINGS: Endotracheal tube terminates 6 cm above the carina. Right internal jugular approach central venous catheter seen with tip at the expected location of superior vena cava. Cardiomediastinal silhouette is enlarged, likely exaggerated by portable technique and rotation. Mediastinal contours appear intact. There is no evidence of focal airspace consolidation, pleural effusion or pneumothorax. Lung volumes are low. Osseous structures are without acute abnormality. Soft tissues are grossly normal. IMPRESSION: Low lung volumes. Endotracheal tube 6 cm above the carina. Advancement with 2 cm may be considered. Right-sided central venous catheter in satisfactory position radiographically. Electronically Signed   By: Fidela Salisbury M.D.   On: 09/04/2015 16:06   Medications: I have reviewed the patient's current medications.  Assessment/Plan: 1) Anemia due to a large duodenal ulcer s/p clipping/laryngeal cancer ?SCC with a trach; continue supportive care. No further role for GI at this point. If a PEG is needed, it wold have to be done by IR. 2) Renal mass.  3) SIRS due to a UTI. 4) Hypertension.  LOS: 6 days   Mikhai Bienvenue 09/05/2015, 5:59 PM

## 2015-09-05 NOTE — Progress Notes (Signed)
Peripherally Inserted Central Catheter/Midline Placement  The IV Nurse has discussed with the patient and/or persons authorized to consent for the patient, the purpose of this procedure and the potential benefits and risks involved with this procedure.  The benefits include less needle sticks, lab draws from the catheter and patient may be discharged home with the catheter.  Risks include, but not limited to, infection, bleeding, blood clot (thrombus formation), and puncture of an artery; nerve damage and irregular heat beat.  Alternatives to this procedure were also discussed.  PICC/Midline Placement Documentation        Mickeal Skinner 09/05/2015, 5:36 PM

## 2015-09-05 NOTE — Consult Note (Signed)
Name: Ashley Ramsey MRN: [id number] DOB: 1956/01/20    ADMISSION DATE:  08/29/2015 CONSULTATION DATE:  1/23  REFERRING MD :  singh ([facility])   CHIEF COMPLAINT:  Hypotension, GI bleed   HISTORY OF PRESENT ILLNESS: 60yo female with hx HTN, DM who initially presented 1/16 with hoarseness and difficulty swallowing.  CT neck revealed laryngeal mass and CT abd revealed renal mass.  She was seen in consultation by ENT and underwent tracheostomy placement 1/22.  Course was c/b GI bleed.  EGD performed on 1/22 revealed large duodenal ulcer which was actively bleeding and was clipped.  She remains in ICU post EGD and trach placement and PCCM consulted to assist.   Pt currently in ICU, on ATC.  Denies abd pain, chest pain, SOB.  Does c/o difficulty swallowing.   SIGNIFICANT EVENTS  1/22 OR>> trach placement  1/22 EGD>>> large duodenal ulcer, clipped   STUDIES:  CT neck 1/16>>> 1. 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  2. Suspect metastatic nodal disease at level 3 on the right (small but asymmetric up to 9 mm nodes). Bilateral level 2 nodes are indeterminate and also measure up to 9 mm individually. No cystic or necrotic nodes in the neck CT chest/abd/pelvis 1/16>>>there is a 5.9 x 6.1 x 6.3 cm heterogeneously enhancing mass in the lower pole of the left kidney highly concerning for renal cell carcinoma.  1.2 x 1.4 cm indeterminate nodule in the left adrenal gland.  4 mm subpleural nodules in the right middle lobe and left lower lobe.   PAST MEDICAL HISTORY :   has a past medical history of Thyroid disease; Hypertension; Neck mass (hospitalized 08/29/2015); Type II diabetes mellitus ([place]); Left kidney mass; and Anemia, chronic disease.  has past surgical history that includes No past surgeries and Esophagogastroduodenoscopy (N/A, 09/04/2015). Prior to Admission medications   Medication Sig Start Date  End Date Taking? Authorizing Provider  ibuprofen (ADVIL,MOTRIN) 200 MG tablet Take 200 mg by mouth daily as needed for moderate pain.   Yes Historical Provider, MD  lisinopril-hydrochlorothiazide (PRINZIDE,ZESTORETIC) 10-12.5 MG per tablet Take 1 tablet by mouth daily. 08/21/13  Yes Lorayne Marek, MD  metFORMIN (GLUCOPHAGE XR) 500 MG 24 hr tablet Take 1 tablet (500 mg total) by mouth daily with breakfast. 08/18/13  Yes Lorayne Marek, MD  Vitamin D, Ergocalciferol, (DRISDOL) 50000 UNITS CAPS capsule Take 1 capsule (50,000 Units total) by mouth every 7 (seven) days. 08/18/13  Yes Lorayne Marek, MD  glucose monitoring kit (FREESTYLE) monitoring kit 1 each by Does not apply route 3 (three) times daily before meals. 07/17/13   Lorayne Marek, MD  nicotine (NICODERM CQ - DOSED IN MG/24 HOURS) 21 mg/24hr patch Place 1 patch (21 mg total) onto the skin daily. Patient not taking: Reported on 08/29/2015 07/17/13   Lorayne Marek, MD   No Known Allergies  FAMILY HISTORY:  family history includes Cancer in her daughter; Diabetes in her mother and sister; Heart disease in her mother; Hypertension in her mother and sister. SOCIAL HISTORY:  reports that she quit smoking about 7 weeks ago. Her smoking use included Cigarettes. She has a 17 pack-year smoking history. She has never used smokeless tobacco. She reports that she drinks about 15.0 oz of alcohol per week. She reports that she does not use illicit drugs.  REVIEW OF SYSTEMS:   As per HPI - All other systems reviewed and were neg.  SUBJECTIVE:   VITAL SIGNS: Temp:  [97.2 F (36.2 C)-99 F (37.2 C)] 99 F (37.2 C) (01/23 0824) Pulse Rate:  [82-130] 91 (01/23 1000) Resp:  [0-31] 0 (01/23 1000) BP: (64-156)/(39-103) 139/75 mmHg (01/23 1000) SpO2:  [97 %-100 %] 98 % (01/23 1000) Arterial Line BP: (81-190)/(22-84) 168/75 mmHg (01/23 1000) FiO2 (%):  [28 %] 28 % (01/23 0840)  PHYSICAL EXAMINATION: General:  Chronically ill appearing female, NAD  Neuro:   Awake, alert, appropriate, follows commands  HEENT:  Mm moist, trach c/d Cardiovascular:  s1s2 rrr Lungs:  resps even non labored on ATC, diminished bases, otherwise clear  Abdomen:  Round, mildly distended, non tender  Musculoskeletal:  Warm and dry, 1+ pitting BLE edema    Recent Labs Lab 09/02/15 0547 09/04/15 0325 09/04/15 1504 09/05/15 0350  NA 146* 146* 146* 145  K 3.6 3.7 3.5 3.8  CL 112* 116*  --  115*  CO2 24 23  --  25  BUN 22* 17  --  21*  CREATININE 0.82 0.89  --  0.96  GLUCOSE 101* 216*  --  127*    Recent Labs Lab 09/04/15 0325 09/04/15 1504 09/04/15 2105 09/05/15 0350  HGB 6.6* 5.1*  5.6* 10.1* 9.6*  HCT 20.7* 15.0*  16.8* 29.4* 27.8*  WBC 18.1* 18.5*  --  19.1*  PLT 196 140*  --  103*   Dg Chest Port 1 View  09/04/2015  CLINICAL DATA:  Central line placement. EXAM: PORTABLE CHEST 1 VIEW COMPARISON:  08/29/2015 FINDINGS: Endotracheal tube terminates 6 cm above the carina. Right internal jugular approach central venous catheter seen with tip at the expected location of superior vena cava. Cardiomediastinal silhouette is enlarged, likely exaggerated by portable technique and rotation. Mediastinal contours appear intact. There is no evidence of focal airspace consolidation, pleural effusion or pneumothorax. Lung volumes are low. Osseous structures are without acute abnormality. Soft tissues are grossly normal. IMPRESSION: Low lung volumes. Endotracheal tube 6 cm above the carina. Advancement with 2 cm may be considered. Right-sided central venous catheter in satisfactory position radiographically. Electronically Signed   By: Fidela Salisbury M.D.   On: 09/04/2015 16:06   Dg Swallowing Func-speech Pathology  09/03/2015  Objective Swallowing Evaluation:   Patient Details Name: Ashley Ramsey MRN: [id number] Date of Birth: [birth year] Today's Date: 09/03/2015 Time: SLP Start Time (ACUTE ONLY): 1124-SLP Stop Time (ACUTE ONLY): 1143 SLP Time Calculation (min) (ACUTE  ONLY): 19 min Past Medical History: Past Medical History Diagnosis Date . Thyroid disease  . Hypertension  . Neck mass hospitalized 08/29/2015 . Type II diabetes mellitus ([place])  . Left kidney mass  . Anemia, chronic disease  Past Surgical History: Past Surgical History Procedure Laterality Date . No past surgeries   HPI: Pt is 60 y.o. female with h/o hypertension and NIDDM. Pt presented to ED with voice changes, difficult swallowing and abdominal discomfort that have progressed over the last month. 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 Subjective: pt alert, eager for food/drink Assessment / Plan / Recommendation CHL IP CLINICAL IMPRESSIONS 09/03/2015 Therapy Diagnosis Severe pharyngeal phase dysphagia Clinical Impression Pt has a severe pharyngeal dysphaghia secondary to presence of supraglottic mass, which impedes bolus flow and decreased airway closure. All consistencies enter the airway during the swallow even with tsp-sized boluses. Aspiration is usually sensed, but she is not able to expel aspirates from her trachea. A chin tuck paired with an immediate cough facilitates clearance through  the pharynx and clearance of penetrates with all consistencies tested; however, all liquids are then re-penetrated and aspirated after the swallow. Purees do not re-enter the airway, although aspiration risk remains due to residue. Recommend Dys 1 diet and pudding thick liquids with a chin tuck and immediate cough following all bites. Impact on safety and function Moderate aspiration risk;Risk for inadequate nutrition/hydration   CHL IP TREATMENT RECOMMENDATION 09/03/2015 Treatment Recommendations Therapy as outlined in treatment plan below   Prognosis 09/03/2015 Prognosis for Safe Diet Advancement Guarded Barriers to Reach Goals -- Barriers/Prognosis Comment -- CHL IP DIET RECOMMENDATION 09/03/2015 SLP Diet Recommendations Dysphagia 1 (Puree) solids;Pudding thick liquid  Liquid Administration via Spoon Medication Administration Crushed with puree Compensations Slow rate;Small sips/bites;Chin tuck;Hard cough after swallow Postural Changes Remain semi-upright after after feeds/meals (Comment);Seated upright at 90 degrees   CHL IP OTHER RECOMMENDATIONS 09/03/2015 Recommended Consults -- Oral Care Recommendations Oral care BID Other Recommendations Order thickener from pharmacy;Prohibited food (jello, ice cream, thin soups);Remove water pitcher   CHL IP FOLLOW UP RECOMMENDATIONS 09/03/2015 Follow up Recommendations Outpatient SLP;24 hour supervision/assistance   CHL IP FREQUENCY AND DURATION 09/03/2015 Speech Therapy Frequency (ACUTE ONLY) min 2x/week Treatment Duration 2 weeks      CHL IP ORAL PHASE 09/03/2015 Oral Phase WFL Oral - Pudding Teaspoon -- Oral - Pudding Cup -- Oral - Honey Teaspoon -- Oral - Honey Cup -- Oral - Nectar Teaspoon -- Oral - Nectar Cup -- Oral - Nectar Straw -- Oral - Thin Teaspoon -- Oral - Thin Cup -- Oral - Thin Straw -- Oral - Puree -- Oral - Mech Soft -- Oral - Regular -- Oral - Multi-Consistency -- Oral - Pill -- Oral Phase - Comment --  CHL IP PHARYNGEAL PHASE 09/03/2015 Pharyngeal Phase Impaired Pharyngeal- Pudding Teaspoon -- Pharyngeal -- Pharyngeal- Pudding Cup -- Pharyngeal -- Pharyngeal- Honey Teaspoon Reduced airway/laryngeal closure;Penetration/Aspiration during swallow;Penetration/Apiration after swallow;Pharyngeal residue - pyriform;Pharyngeal residue - posterior pharnyx;Compensatory strategies attempted (with notebox) Pharyngeal Material enters airway, passes BELOW cords and not ejected out despite cough attempt by patient Pharyngeal- Honey Cup -- Pharyngeal -- Pharyngeal- Nectar Teaspoon Reduced airway/laryngeal closure;Penetration/Aspiration during swallow;Penetration/Apiration after swallow;Pharyngeal residue - pyriform;Pharyngeal residue - posterior pharnyx;Compensatory strategies attempted (with notebox) Pharyngeal Material enters airway,  passes BELOW cords and not ejected out despite cough attempt by patient Pharyngeal- Nectar Cup -- Pharyngeal -- Pharyngeal- Nectar Straw -- Pharyngeal -- Pharyngeal- Thin Teaspoon Reduced airway/laryngeal closure;Penetration/Aspiration during swallow;Penetration/Apiration after swallow;Pharyngeal residue - pyriform;Pharyngeal residue - posterior pharnyx;Compensatory strategies attempted (with notebox) Pharyngeal Material enters airway, passes BELOW cords and not ejected out despite cough attempt by patient Pharyngeal- Thin Cup -- Pharyngeal -- Pharyngeal- Thin Straw Reduced airway/laryngeal closure;Penetration/Aspiration during swallow;Penetration/Apiration after swallow;Pharyngeal residue - pyriform;Pharyngeal residue - posterior pharnyx;Compensatory strategies attempted (with notebox) Pharyngeal Material enters airway, passes BELOW cords and not ejected out despite cough attempt by patient Pharyngeal- Puree Reduced airway/laryngeal closure;Penetration/Aspiration during swallow;Compensatory strategies attempted (with notebox);Pharyngeal residue - posterior pharnyx Pharyngeal Material enters airway, remains ABOVE vocal cords and not ejected out Pharyngeal- Mechanical Soft -- Pharyngeal -- Pharyngeal- Regular -- Pharyngeal -- Pharyngeal- Multi-consistency -- Pharyngeal -- Pharyngeal- Pill -- Pharyngeal -- Pharyngeal Comment --  CHL IP CERVICAL ESOPHAGEAL PHASE 09/03/2015 Cervical Esophageal Phase (No Data) Pudding Teaspoon -- Pudding Cup -- Honey Teaspoon -- Honey Cup -- Nectar Teaspoon -- Nectar Cup -- Nectar Straw -- Thin Teaspoon -- Thin Cup -- Thin Straw -- Puree -- Mechanical Soft -- Regular -- Multi-consistency -- Pill -- Cervical Esophageal Comment -- No flowsheet data found.  Germain Osgood, M.A. CCC-SLP [phone] Germain Osgood 09/03/2015, 1:33 PM               ASSESSMENT / PLAN:  Laryngeal mass - s/p trach 1/22.  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  Likely unrelated to renal mass which  may be renal cell carcinoma.  PLAN -  Per ENT  Will need PEG eventually - likely PICC and TPN for now given GI complications Cont trach collar as tol  Will order speech for PMV  Poor prognosis - oncology following peripherally for now  Likely needs palliative care involvement early  F/u bx results   Subpleural lung nodules - RML, LLL  PLAN -  Repeat CT chest in 6 months   HTN - POS 14L this admission with multiple units PRBC.  PLAN -  Lasix x1 now -- was ordered after PRBC but not given  If remains hypertensive would add clonidine patch without route for GI meds   GI bleed -- s/p clipping of duodenal ulcer.  Hgb, BP stable.  No further evidence active bleeding.  Blood loss anemia - resolved.  Hgb stable.  Hx ETOH  PLAN -  If she has evidence of recurrent bleeding will need IR for consideration of embolization vs. Tagged RBC scan q8 CBC  PPI  GI following  NPO  Thiamine, folate  CIWA protocol   UTI  Acute renal failure - resolved PLAN -  Cont rocephin    OK for tx to SDU and remain on [facility] service.  PCCM signing off please call back if needed.   Nickolas Madrid, NP 09/05/2015  11:23 AM Pager: [phone] or [phone]   Attending Note:  I have examined patient, reviewed labs, studies and notes. I have discussed the case with Shon Millet, and I agree with the data and plans as I have amended above. Very unfortunate case, laryngeal mass was just biopsies with trach placement. Also with renal mass that hasn't been worked up to date. Course complicated by brisk UGIB due to DU, now clipped. She is tolerating ATC, suspect we can move to [place] today. She will need an alternative means of nutrition as she will not be able to take PO - plan is for TPN initially and then eval for possible PEG. Renal mass will influence prognosis and should also influence GOC. Oncology has recommended palliative Care eval and I agree with this. We will follow CBC, continue ATC. Agree with Dr  Keturah Barre plans to move to SDU.    Baltazar Apo, MD, PhD 09/05/2015, 11:32 AM [facility] [phone] or if no answer [phone]

## 2015-09-05 NOTE — Evaluation (Signed)
[facility] - Evaluation Patient Details  Name: Ashley Ramsey MRN: [id number] Date of Birth: 1956/02/02  Today's Date: 09/05/2015 Time: 1330-1340 SLP Time Calculation (min) (ACUTE ONLY): 10 min  Past Medical History:  Past Medical History  Diagnosis Date  . Thyroid disease   . Hypertension   . Neck mass hospitalized 08/29/2015  . Type II diabetes mellitus ([place])   . Left kidney mass   . Anemia, chronic disease    Past Surgical History:  Past Surgical History  Procedure Laterality Date  . No past surgeries    . Esophagogastroduodenoscopy N/A 09/04/2015    Procedure: ESOPHAGOGASTRODUODENOSCOPY (EGD);  Surgeon: Manus Gunning, MD;  Location: [place];  Service: Gastroenterology;  Laterality: N/A;  . Direct laryngoscopy N/A 09/02/2015    Procedure: DIRECT LARYNGOSCOPY WITH BIOPSY;  Surgeon: Izora Gala, MD;  Location: [place];  Service: ENT;  Laterality: N/A;  . Esophagoscopy N/A 09/02/2015    Procedure: ESOPHAGOSCOPY;  Surgeon: Izora Gala, MD;  Location: [facility];  Service: ENT;  Laterality: N/A;  . Tracheostomy tube placement N/A 09/04/2015    Procedure: TRACHEOSTOMY;  Surgeon: Izora Gala, MD;  Location: [facility];  Service: ENT;  Laterality: N/A;  . Direct laryngoscopy  09/04/2015    Procedure: DIRECT View LARYNGOSCOPY with cautery of of tumor;  Surgeon: Izora Gala, MD;  Location: [facility];  Service: ENT;;   HPI:  Pt is 60 y.o. female with h/o hypertension and NIDDM. Pt presented to ED with voice changes, difficult swallowing and abdominal discomfort that have progressed over the last month. 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   Assessment / Plan / Recommendation Clinical Impression  Pt seen for PMSV after trach being placed yesterday due to GI bleed. Pt is calm and alert, following commands well. PMSV placed for 60 second interval with no change in vital signs or signs of distress, though audible air trapping  noted. Pt needed significant force to redirect air past 8 shiley with deflated cuff and known partially obstructed airway. Minimal phonation heard. Pt was able to mobilize standing pharyngeal secretions to mouth for suctioning with verbal cues. Plan to return after trach change to reattempt PMSV for communication. No longer pursuing swallowing therapy as pt is not able to tolerate PO intake due to GI bleed. Will follow for plan of care.     SLP Assessment  Patient needs continued Speech Lanaguage Pathology Services    Follow Up Recommendations       Frequency and Duration min 2x/week  2 weeks    PMSV Trial PMSV was placed for: one minute Able to redirect subglottic air through upper airway: Yes Able to Attain Phonation: Yes Voice Quality: Breathy;Hoarse;Low vocal intensity Able to Expectorate Secretions: Yes Level of Secretion Expectoration with PMSV: Oral Breath Support for Phonation: Moderately decreased Intelligibility: Intelligibility reduced Word: 25-49% accurate Phrase: Not tested Sentence: Not tested Conversation: Not tested SpO2 During Trial: 100 %   Tracheostomy Tube  Additional Tracheostomy Tube Assessment Fenestrated: No Trach Collar Period: all waking hours Secretion Description: tan Frequency of Tracheal Suctioning: once and hour Level of Secretion Expectoration: Tracheal    Vent Dependency  Vent Dependent: No FiO2 (%): 28 %    Cuff Deflation Trial  GO Tolerated Cuff Deflation: Yes Length of Time for Cuff Deflation Trial:  (deflated this am by ENT) Behavior: Alert        Shandria Clinch, Katherene Ponto 09/05/2015, 1:53 PM

## 2015-09-05 NOTE — Consult Note (Signed)
Consultation Note Date: 09/05/2015   Patient Name: Ashley Ramsey  DOB: 11-09-1955  MRN: [id number]  Age / Sex: 60 y.o., female  PCP: Lorayne Marek, MD Referring Physician: Thurnell Lose, MD  Reason for Consultation: Establishing goals of care    Clinical Assessment/Narrative: I met today with Ashley Ramsey and her daughter, Ashley Ramsey [phone] (c). We discussed the overwhelming diagnosis and new they have received since admission. Ashley Ramsey has been diagnosed with head/neck cancer, renal cancer, and received trach while being treated for acute GIB (TPN as bridge until able to place PEG). Dr. Alvy Bimler has seen patient and not recommending systemic chemotherapy but radiation onc consult. Discussed this plan with pt and family.   They seem to have very poor understanding of diagnosis and prognosis at this time. Ashley Ramsey says "can't she have surgery to take care of the cancer so she can recover - she's already been here a week." We discussed that there are a lot of complicating circumstances and that recommendations are to begin with radiation oncology consult and PEG and to take it one step at a time. Unfortunately this is not going to be an easy treatment process and poor prognosis and I attempted to explain this to Ashley Ramsey and daughter Ashley Ramsey. Ashley Ramsey is tearful as she contemplates that options may be limited with the complexity of having 2 primary cancers. Hoping for PMV so that I may converse with Ashley Ramsey more effectively. 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 They also express interest in completing HCPOA and I gave them paperwork and consulted spiritual care to help notarize. I will f/u tomorrow. I feel they will need ongoing palliative discussions and support and recommend outpatient palliative as well.    Contacts/Participants in Discussion: Primary  Decision Maker: Self   Relationship to Patient Current HCPOA is her sister (not present) but she expresses she wants this to be her daughter, Ashley Ramsey. Paperwork given and chaplain consulted to assist with completion.  HCPOA: in procees    SUMMARY OF RECOMMENDATIONS - Will need extensive follow up and conversation regarding plan and expectations - Hoping Ms. State will have use of PMV which will help in further conversations regarding [place] - They seem to want full aggressive care for now as they gather information about diagnosis and options - One goal identified today is that they wish to get her home (daughter Carlton says that she can come to her house so she can care for her)  Code Status/Advance Care Planning: Full code - did not address today    Code Status Orders        Start     Ordered   08/29/15 2219  Full code   Continuous     08/29/15 2218    Code Status History    Date Active Date Inactive Code Status Order ID Comments User Context   This patient has a current code status but no historical code status.      Symptom Management:   No current symptoms. Frequent urination but receiving Lasix.   Trach placed for airway protection.  Malnutrition: TPN started. Will likely need PEG in future once duodenal ulcers healed.   Palliative Prophylaxis:   Bowel Regimen, Frequent Pain Assessment and Oral Care  Additional Recommendations (Limitations, Scope, Preferences):  Full Scope Treatment  Psycho-social/Spiritual:  Support System: Strong Desire for further Chaplaincy support:yes Additional Recommendations: Caregiving  Support/Resources  Prognosis: Very complicated with diagnosis of likely 2 primary cancers.   Discharge Planning: To be determined.    Chief Complaint/ Primary Diagnoses: Present on Admission:  . Neck mass . Renal mass . Essential hypertension, benign . AKI (acute kidney injury) ([place])  I have reviewed the medical record, interviewed the  patient and family, and examined the patient. The following aspects are pertinent.  Past Medical History  Diagnosis Date  . Thyroid disease   . Hypertension   . Neck mass hospitalized 08/29/2015  . Type II diabetes mellitus ([place])   . Left kidney mass   . Anemia, chronic disease    Social History   Social History  . Marital Status: Single    Spouse Name: N/A  . Number of Children: N/A  . Years of Education: N/A   Social History Main Topics  . Smoking status: Former Smoker -- 0.50 packs/day for 34 years    Types: Cigarettes    Quit date: 07/14/2015  . Smokeless tobacco: Never Used  . Alcohol Use: 15.0 oz/week    25 Shots of liquor per week     Comment: 08/29/2015 "I drink 1/5th liquor one 1 day/week"  . Drug Use: No  . Sexual Activity: Not Currently     Comment: occassional    Other Topics Concern  . None   Social History Narrative   Family History  Problem Relation Age of Onset  . Diabetes Mother   . Hypertension Mother   . Heart disease Mother   . Diabetes Sister   . Hypertension Sister   . Cancer Daughter     Stomach   Scheduled Meds: . sodium chloride   Intravenous Once  . cefTRIAXone (ROCEPHIN)  IV  1 g Intravenous Q24H  . folic acid  1 mg Intravenous Daily  . Influenza vac split quadrivalent PF  0.5 mL Intramuscular Tomorrow-1000  . insulin aspart  0-9 Units Subcutaneous TID WC  . loratadine  10 mg Oral Daily  . magnesium sulfate 1 - 4 g bolus IVPB  2 g Intravenous Once  . thiamine  100 mg Intravenous Daily   Continuous Infusions: . dextrose 50 mL/hr at 09/05/15 0900  . Ashley Ramsey     And  . fat emulsion    . lactated ringers 10 mL/hr at 09/02/15 1128  . lactated ringers Stopped (09/04/15 1800)  . pantoprozole (PROTONIX) infusion 8 mg/hr (09/05/15 0700)   PRN Meds:.acetaminophen, dextromethorphan, lidocaine, LORazepam, morphine injection, morphine injection, ondansetron **OR** ondansetron (ZOFRAN) IV, oxymetazoline, RESOURCE THICKENUP  CLEAR Medications Prior to Admission:  Prior to Admission medications   Medication Sig Start Date End Date Taking? Authorizing Provider  ibuprofen (ADVIL,MOTRIN) 200 MG tablet Take 200 mg by mouth daily as needed for moderate pain.   Yes Historical Provider, MD  lisinopril-hydrochlorothiazide (PRINZIDE,ZESTORETIC) 10-12.5 MG per tablet Take 1 tablet by mouth daily. 08/21/13  Yes Lorayne Marek, MD  metFORMIN (GLUCOPHAGE XR) 500 MG 24 hr tablet Take 1 tablet (500 mg total) by mouth daily with breakfast. 08/18/13  Yes Lorayne Marek, MD  Vitamin D, Ergocalciferol, (DRISDOL) 50000 UNITS CAPS capsule Take 1 capsule (50,000 Units total) by mouth every 7 (seven) days. 08/18/13  Yes Lorayne Marek, MD  glucose monitoring kit (FREESTYLE) monitoring kit 1 each by Does not apply route 3 (three) times daily before meals. 07/17/13   Lorayne Marek, MD  nicotine (NICODERM CQ - DOSED IN MG/24 HOURS) 21 mg/24hr patch Place 1 patch (21 mg total) onto the skin daily.  Patient not taking: Reported on 08/29/2015 07/17/13   Lorayne Marek, MD   No Known Allergies  Review of Systems  Unable to perform ROS   Physical Exam  Constitutional: She appears well-developed and well-nourished.  HENT:  Trach  Cardiovascular: Normal rate.   Respiratory: Effort normal. No accessory muscle usage. No tachypnea. No respiratory distress.  GI: Soft. Normal appearance.  Neurological: She is alert.  Orientation difficult to fully assess as she is nonverbal with trach and writing difficult to discern at times.     Vital Signs: BP 139/75 mmHg  Pulse 109  Temp(Src) 99.3 F (37.4 C) (Oral)  Resp 22  Ht '5\' 3"'$  (1.6 m)  Wt 113.399 kg (250 lb)  BMI 44.30 kg/m2  SpO2 100%  SpO2: SpO2: 100 % O2 Device:SpO2: 100 % O2 Flow Rate: .O2 Flow Rate (L/min): 5 L/min  IO: Intake/output summary:  Intake/Output Summary (Last 24 hours) at 09/05/15 1256 Last data filed at 09/05/15 1000  Gross per 24 hour  Intake   6033 ml  Output    120 ml  Net    5913 ml    LBM: Last BM Date: 09/05/15 Baseline Weight: Weight: 113.399 kg (250 lb) Most recent weight: Weight: 113.399 kg (250 lb)      Palliative Assessment/Data:    Additional Data Reviewed:  CBC:    Component Value Date/Time   WBC 19.1* 09/05/2015 0350   HGB 9.6* 09/05/2015 0350   HCT 27.8* 09/05/2015 0350   PLT 103* 09/05/2015 0350   MCV 83.2 09/05/2015 0350   NEUTROABS 16.1* 09/04/2015 0325   LYMPHSABS 1.3 09/04/2015 0325   MONOABS 0.7 09/04/2015 0325   EOSABS 0.1 09/04/2015 0325   BASOSABS 0.0 09/04/2015 0325   Comprehensive Metabolic Panel:    Component Value Date/Time   NA 145 09/05/2015 0350   K 3.8 09/05/2015 0350   CL 115* 09/05/2015 0350   CO2 25 09/05/2015 0350   BUN 21* 09/05/2015 0350   CREATININE 0.96 09/05/2015 0350   CREATININE 0.78 08/17/2013 0929   GLUCOSE 127* 09/05/2015 0350   CALCIUM 7.1* 09/05/2015 0350   AST 13* 08/31/2015 1028   ALT 12* 08/31/2015 1028   ALKPHOS 70 08/31/2015 1028   BILITOT 0.6 08/31/2015 1028   PROT 6.0* 08/31/2015 1028   ALBUMIN 2.5* 08/31/2015 1028     Time In: 1150 Time Out: 1250 Time Total: 22mn Greater than 50%  of this time was spent counseling and coordinating care related to the above assessment and plan.  Signed by: PPershing Proud NP  APershing Proud NP  17/61/9509 12:56 PM  Please contact Palliative Medicine Team phone at [phone] questions and concerns.

## 2015-09-05 NOTE — Progress Notes (Signed)
   09/05/15 1600  Clinical Encounter Type  Visited With Patient;Family;Patient and family together;Health care provider  Visit Type Other (Comment) (Adv directive)  Referral From Nurse;Patient  Consult/Referral To Chaplain  Spiritual Encounters  Spiritual Needs Other (Comment);Literature (education pn Adv directive)  Stress Factors  Family Stress Factors Other (Comment) (Issues with daughters of pt)  Advance Directives (For Healthcare)  Does patient have an advance directive? No  Would patient like information on creating an advanced directive? Yes - Educational materials given  Ch met with pt and discussed adv. Directive; pt understands and wants to go over with family; daughter Anna questioned HCPOA and further discussion amongst pt and family.  Paperwork should be completed shortly.  Contact [facility] to arrange for notary between 9-3. 4:38 PM Michael I Gisser  

## 2015-09-05 NOTE — Anesthesia Postprocedure Evaluation (Signed)
Anesthesia Post Note  Patient: Ashley Ramsey  Procedure(s) Performed: Procedure(s) (LRB): TRACHEOSTOMY (N/A) DIRECT View LARYNGOSCOPY with cautery of of tumor  Patient location during evaluation: PACU Anesthesia Type: General Level of consciousness: sedated and patient cooperative Pain management: pain level controlled Vital Signs Assessment: post-procedure vital signs reviewed and stable Respiratory status: spontaneous breathing Cardiovascular status: stable Anesthetic complications: no    Last Vitals:  Filed Vitals:   09/05/15 0415 09/05/15 0430  BP:  135/73  Pulse: 86 87  Temp:    Resp: 13 0    Last Pain:  Filed Vitals:   09/05/15 0433  PainSc: Asleep                 Nolon Nations

## 2015-09-05 NOTE — Progress Notes (Addendum)
PARENTERAL NUTRITION CONSULT NOTE - INITIAL  Pharmacy Consult for TPN  Indication: NPO due to gastric ulcer/laryngeal cancer   No Known Allergies  Patient Measurements: Height: _0  (160 cm) Weight: 250 lb (113.399 kg) IBW/kg (Calculated) : 52.4 Adjusted Body Weight: 67 kg  Usual Weight: 113.4 BMI = 44   Vital Signs: Temp: 99 F (37.2 C) (01/23 0824) Temp Source: Oral (01/23 0824) BP: 135/73 mmHg (01/23 0600) Pulse Rate: 87 (01/23 0840) Intake/Output from previous day: 01/22 0701 - 01/23 0700 In: 6573 [I.V.:4450; Blood:2073; IV VHQIONGEX:52] Out: 120 [Urine:100; Blood:20] Intake/Output from this shift:    Labs:  Recent Labs  09/04/15 0325 09/04/15 1504 09/04/15 2105 09/05/15 0350  WBC 18.1* 18.5*  --  19.1*  HGB 6.6* 5.1*  5.6* 10.1* 9.6*  HCT 20.7* 15.0*  16.8* 29.4* 27.8*  PLT 196 140*  --  103*  APTT  --  26  --   --   INR  --  1.34  --   --      Recent Labs  09/04/15 0325 09/04/15 1504 09/05/15 0350  NA 146* 146* 145  K 3.7 3.5 3.8  CL 116*  --  115*  CO2 23  --  25  GLUCOSE 216*  --  127*  BUN 17  --  21*  CREATININE 0.89  --  0.96  CALCIUM 7.9*  --  7.1*  MG  --   --  1.1*   Estimated Creatinine Clearance: 76.5 mL/min (by C-G formula based on Cr of 0.96).    Recent Labs  09/05/15 0010 09/05/15 0356 09/05/15 0823  GLUCAP 137* 106* 110*    Medical History: Past Medical History  Diagnosis Date  . Thyroid disease   . Hypertension   . Neck mass hospitalized 08/29/2015  . Type II diabetes mellitus ([place])   . Left kidney mass   . Anemia, chronic disease     Medications:  Prescriptions prior to admission  Medication Sig Dispense Refill Last Dose  . ibuprofen (ADVIL,MOTRIN) 200 MG tablet Take 200 mg by mouth daily as needed for moderate pain.   08/28/2015 at Unknown time  . lisinopril-hydrochlorothiazide (PRINZIDE,ZESTORETIC) 10-12.5 MG per tablet Take 1 tablet by mouth daily. 90 tablet 3 1 year  . metFORMIN (GLUCOPHAGE XR) 500 MG 24  hr tablet Take 1 tablet (500 mg total) by mouth daily with breakfast. 30 tablet 3 1 year  . Vitamin D, Ergocalciferol, (DRISDOL) 50000 UNITS CAPS capsule Take 1 capsule (50,000 Units total) by mouth every 7 (seven) days. 12 capsule 0 1 year  . glucose monitoring kit (FREESTYLE) monitoring kit 1 each by Does not apply route 3 (three) times daily before meals. 1 each 0   . nicotine (NICODERM CQ - DOSED IN MG/24 HOURS) 21 mg/24hr patch Place 1 patch (21 mg total) onto the skin daily. (Patient not taking: Reported on 08/29/2015) 28 patch 0 Not Taking at Unknown time    Insulin Requirements in the past 24 hours:  None   Assessment: 48 YOF who presented with worsening hoarseness of voice and abdominal pain. She had difficulty swallowing foods, N/V, weight loss and no appetite. CT neck found laryngeal mass and CT abdmonen revealed a left renal mass. She was also found to have a UTI with SIRS. Admitted for IV antibiotics. Underwent a biopsy by ENT on 1/20. Hospital course also complicated by development of lower GI bleeding and associated blood loss anemia   Surgeries/Procedures:  GI: S/p upper GI bleed on 09/04/15 requiring  multiple units of PRBC. GI/ENT consulted. Underwent tracheostomy placement by ENT on 1/23 to secure airway. She will be nothing by mouth for a while and PEG tube not possible due to likely stomach and duodenal ulcers. On PPI drip  Endo: CBGs 106-145, on SSI  Lytes: Ca 7.1 (no albumin), Mg 1.1 (supplemented), No Phos, K 3.8  Renal: SCr 0.96, CrCl ~ 75-80 mL/min, LR @ 50 mL/hr  Pulm: Trach collar; 28% FiO2 Cards: VSS  Hepatobil: No labs yet  Neuro: Probable head and neck cancer. Per oncology, she is poor candidate for chemotherapy and most likely has renal cancer. Prognosis is extremely. Oncology recommends palliative radiation treatments and palliative care consult. On folic acid and thiamine  ID: WBC elevated at 19.1, On ceftriaxone for E.coli UTI  Best Practices: PPI, SCDs  TPN  Access: PICC line ordered  TPN start date: 1/23>>   Current Nutrition:  NPO   Nutritional Goals:  Per RD recommendations   Plan:  -Start Clinimix-E 5/15 @ 30 mL/hr and 20% IVFE @ 10 mL/hr. Advance to goal as tolerated  -Draw phosphorus level prior to starting TPN. Supplement if necessary -Provide additional 2 gm of IV MgSO4 for total of 4 grams since patient at risk for re-feeding syndrome  -Add MVI and TE to TPN bag  -Order TPN labs -F/u dietician recommendations  -Monitor for s/s of refeeding    Albertina Parr, PharmD., BCPS Clinical Pharmacist Pager [phone]   Addendum: Phos level on low end of normal at 2.6. Considering patient is at risk for refeeding, anticipate this to trend down when TPN starts. Will give small dose of Kphos 10 mmol x 1 today.   Albertina Parr, PharmD., BCPS Clinical Pharmacist Pager [phone]

## 2015-09-05 NOTE — Progress Notes (Signed)
Palliative:  Full note to follow. I met today with Ashley Ramsey and her Ashley, Ashley Ramsey [phone] (c). We discussed the overwhelming diagnosis and new they have received since admission. Ashley Ramsey has been diagnosed with head/neck cancer, renal cancer, and received trach while being treated for acute GIB (TPN as bridge until able to place PEG). Ashley Ramsey has seen patient and not recommending systemic chemotherapy but radiation onc consult. Discussed this plan with pt and family.   They seem to have very poor understanding of diagnosis and prognosis at this time. Ashley Ramsey says "can't she have surgery to take care of the cancer so she can recover - she's already been here a week." We discussed that there are a lot of complicating circumstances and that recommendations are to begin with radiation oncology consult and PEG and to take it one step at a time. Unfortunately this is not going to be an easy treatment process and poor prognosis and I attempted to explain this to Ashley Ramsey and Ashley Ramsey. Ashley Ramsey is tearful as she contemplates that options may be limited with the complexity of having 2 primary cancers. Hoping for PMV so that I may converse with Ashley Ramsey more effectively. 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 They also express interest in completing HCPOA and I gave them paperwork and consulted spiritual care to help notarize. I will f/u tomorrow. I feel they will need ongoing palliative discussions and support and recommend outpatient palliative as well.   Ashley Sill, NP Palliative Medicine Team Pager # [phone] (M-F 8a-5p) Team Phone # [phone] (Nights/Weekends)

## 2015-09-06 ENCOUNTER — Inpatient Hospital Stay (HOSPITAL_COMMUNITY): Payer: Medicaid Other

## 2015-09-06 DIAGNOSIS — C329 Malignant neoplasm of larynx, unspecified: Secondary | ICD-10-CM | POA: Insufficient documentation

## 2015-09-06 DIAGNOSIS — Z515 Encounter for palliative care: Secondary | ICD-10-CM | POA: Insufficient documentation

## 2015-09-06 DIAGNOSIS — R1310 Dysphagia, unspecified: Secondary | ICD-10-CM | POA: Insufficient documentation

## 2015-09-06 LAB — COMPREHENSIVE METABOLIC PANEL
ALT: 9 U/L — ABNORMAL LOW (ref 14–54)
ANION GAP: 7 (ref 5–15)
AST: 13 U/L — ABNORMAL LOW (ref 15–41)
Albumin: 1.5 g/dL — ABNORMAL LOW (ref 3.5–5.0)
Alkaline Phosphatase: 48 U/L (ref 38–126)
BUN: 20 mg/dL (ref 6–20)
CHLORIDE: 108 mmol/L (ref 101–111)
CO2: 27 mmol/L (ref 22–32)
Calcium: 7 mg/dL — ABNORMAL LOW (ref 8.9–10.3)
Creatinine, Ser: 0.93 mg/dL (ref 0.44–1.00)
Glucose, Bld: 135 mg/dL — ABNORMAL HIGH (ref 65–99)
POTASSIUM: 3.3 mmol/L — AB (ref 3.5–5.1)
Sodium: 142 mmol/L (ref 135–145)
Total Bilirubin: 0.3 mg/dL (ref 0.3–1.2)
Total Protein: 3.9 g/dL — ABNORMAL LOW (ref 6.5–8.1)

## 2015-09-06 LAB — DIFFERENTIAL
BASOS PCT: 0 %
Basophils Absolute: 0 10*3/uL (ref 0.0–0.1)
EOS ABS: 0.2 10*3/uL (ref 0.0–0.7)
Eosinophils Relative: 1 %
LYMPHS PCT: 13 %
Lymphs Abs: 1.8 10*3/uL (ref 0.7–4.0)
MONOS PCT: 6 %
Monocytes Absolute: 0.8 10*3/uL (ref 0.1–1.0)
NEUTROS ABS: 10.4 10*3/uL — AB (ref 1.7–7.7)
NEUTROS PCT: 80 %

## 2015-09-06 LAB — GLUCOSE, CAPILLARY
Glucose-Capillary: 130 mg/dL — ABNORMAL HIGH (ref 65–99)
Glucose-Capillary: 114 mg/dL — ABNORMAL HIGH (ref 65–99)
Glucose-Capillary: 108 mg/dL — ABNORMAL HIGH (ref 65–99)
Glucose-Capillary: 108 mg/dL — ABNORMAL HIGH (ref 65–99)
Glucose-Capillary: 143 mg/dL — ABNORMAL HIGH (ref 65–99)

## 2015-09-06 LAB — CBC
HCT: 24.6 % — ABNORMAL LOW (ref 36.0–46.0)
Hemoglobin: 8.2 g/dL — ABNORMAL LOW (ref 12.0–15.0)
MCH: 28.2 pg (ref 26.0–34.0)
MCHC: 33.3 g/dL (ref 30.0–36.0)
MCV: 84.5 fL (ref 78.0–100.0)
PLATELETS: 107 10*3/uL — AB (ref 150–400)
RBC: 2.91 MIL/uL — AB (ref 3.87–5.11)
RDW: 15.9 % — AB (ref 11.5–15.5)
WBC: 12.9 10*3/uL — AB (ref 4.0–10.5)

## 2015-09-06 LAB — TRIGLYCERIDES: TRIGLYCERIDES: 97 mg/dL (ref <150)

## 2015-09-06 LAB — PHOSPHORUS: Phosphorus: 3 mg/dL (ref 2.5–4.6)

## 2015-09-06 LAB — HEMOGLOBIN AND HEMATOCRIT, BLOOD
HCT: 27.4 % — ABNORMAL LOW (ref 36.0–46.0)
Hemoglobin: 9.4 g/dL — ABNORMAL LOW (ref 12.0–15.0)

## 2015-09-06 LAB — PREALBUMIN: PREALBUMIN: 8.7 mg/dL — AB (ref 18–38)

## 2015-09-06 LAB — MAGNESIUM: MAGNESIUM: 1.6 mg/dL — AB (ref 1.7–2.4)

## 2015-09-06 MED ORDER — POTASSIUM CHLORIDE 10 MEQ/100ML IV SOLN
10.0000 meq | INTRAVENOUS | Status: AC
  Administered 2015-09-06 (×4): 10 meq via INTRAVENOUS
  Filled 2015-09-06 (×4): qty 100

## 2015-09-06 MED ORDER — FAT EMULSION 20 % IV EMUL
240.0000 mL | INTRAVENOUS | Status: AC
  Administered 2015-09-06: 240 mL via INTRAVENOUS
  Filled 2015-09-06: qty 250

## 2015-09-06 MED ORDER — TRACE MINERALS CR-CU-MN-SE-ZN 10-1000-500-60 MCG/ML IV SOLN
INTRAVENOUS | Status: AC
  Administered 2015-09-06: 18:00:00 via INTRAVENOUS
  Filled 2015-09-06: qty 1200

## 2015-09-06 MED ORDER — SODIUM CHLORIDE 0.9 % IV BOLUS (SEPSIS): 500.0000 mL | Freq: Once | INTRAVENOUS | Status: DC

## 2015-09-06 MED ORDER — MAGNESIUM SULFATE IN D5W 10-5 MG/ML-% IV SOLN
1.0000 g | Freq: Once | INTRAVENOUS | Status: AC
  Administered 2015-09-06: 1 g via INTRAVENOUS
  Filled 2015-09-06: qty 100

## 2015-09-06 NOTE — Progress Notes (Signed)
PATIENT DETAILS Name: Ashley Ramsey Age: 60 y.o. Sex: female Date of Birth: [birth year] Admit Date: 08/29/2015 Admitting Physician [name], MD OV:[id number], [name], MD  Brief narrative:  60 year old female with past medical history of hypertension, type 2 diabetes presented to the ED with worsening hoarseness of voice and abdominal pain. Upon further evaluation with a CT neck she was found to have a laryngeal mask, CT abdomen revealed a left renal mass. She was also found to have UTI with SIRS. She was admitted and started on IV antibiotics, ENT was consulted-patient underwent a biopsy on 1/20. Hospital course has been complicated by development of lower GI bleeding and associated blood loss anemia.   She had an episode of hematemesis on 09/04/2015 after with GI was reconsulted, ENT was called as well, she is post tracheostomy on 09/04/2015..  Subjective:  Patient in bed, currently no chest abdominal pain, no shortness of breath. Feels better.  Assessment/Plan:   Invasive Sq Cell Laryngeal CA : ENT consulted, underwent laryngoscopy with biopsy on 1/20. Oncology consulted, I had detailed discussions with oncologist Dr. Alvy Bimler on 09/04/2015. Patient's prognosis is extremely poor, poor candidate for chemotherapy, also most likely has renal cancer. She recommends palliative radiation treatments and consultation with palliative care.  She was doing fairly well but in the morning of 09/04/2015 did having brisk upper GI bleed requiring multiple units of packed RBC transfusion, GI & ENT were both consulted. She underwent tracheostomy placement by ENT on 09/05/2015 to secure airway. Trach care and speech following along with ENT Dr Constance Holster.  She will be nothing by mouth for a while had dysphagia prior to trait, also no PEG tube as she has possible stomach and duodenal ulcers. Now has a PICC line for TNA.   Acute blood loss related anemia due to UGI bleed. 5 units of  packed RBC transfusion on 09/04/2015, H&H now stable, GI bleed due to likely proximal stomach and duodenal ulcer, status post EGD on 09/04/2015, IV PPI monitor H&H. GI following.    Left renal mass: Highly suspicious for renal cell carcinoma. Doubt it is related to above. Spoke with Dr. Ree Kida MD on 1/17,he reviewed patient's CT scan in chart - recommends outpatient follow-up with him in the office for consideration of nephrectomy in the future.  However patient will have a laryngeal mass addressed first with palliative radiation treatments, currently too weak for chemotherapy and nephrectomy.   Dysphagia: Secondary to known laryngeal mass. Speech therapy following-completed MBS-recommendations are for dysphagia 1 diet. Await arrival of family-will discuss with patient/family regarding risks of aspiration. She will most likely may require PEG tube in the future once GI Ulcers are better.  E coli pyelonephritis with SIRS: Sensitivities noted, continue Rocephin-stop date on 1/22.  Afebrile however WBC fluctuating-suspect persistent leukocytosis from inflammation due to malignancy-rather than UTI.  Acute renal failure: Likely prerenal azotemia in a setting of UTI/GI bleeding-along with lisinopril/HCTZ use.  Resolved with hydration. Continue to monitor BMP daily given acute blood loss.  Hypertension: Controlled. Continue to hold lisinopril/HCTZ.   Tobacco & Alcohol abuse: Counseled, completed Ativan per CIWA protocol-no signs of withdrawal  Left adrenal gland nodule: Will need outpatient follow-up, especially given left adrenal mass.  Subpleural nodules in the right middle lobe of right lung and left lower lobe of left lung: Will need to repeat CT chest in 6 months. Stable on room air, no shortness of breath  Possible  CAD: Atherosclerosis seen CT chest. Unfortunately with ongoing lower GI bleeding-unable to use antiplatelets. Echo with LFEV 0000000, grade 1 diastolic dysfunction, no wall motion  abnormalities. Given numerous above medical problems-not a candidate for any procedures/further workup at this point.  Morbid obesity: Has lost significant amount of weight over the past few months.  Type 2 diabetes: Controlled with CBGs 110-130s. Continue SSI, hold metformin. Follow CBGs  CBG (last 3)   Recent Labs  09/05/15 2206 09/06/15 0038 09/06/15 0611  GLUCAP 151* 130* 114*     Disposition: Remains inpatient-Home in next few days  Antimicrobial agents  See below  Anti-infectives    Start     Dose/Rate Route Frequency Ordered Stop   08/29/15 2330  cefTRIAXone (ROCEPHIN) 1 g in dextrose 5 % 50 mL IVPB     1 g 100 mL/hr over 30 Minutes Intravenous Every 24 hours 08/29/15 2250        DVT Prophylaxis: SCD's, no pharmacological DVT ppx given GI bleed/anemia  Code Status: Full code   Family Communication Daughter  Procedures:  ECHO- TTE  - Left ventricle: The cavity size was normal. Wall thickness was normal. Systolic function was normal. The estimated ejectionfraction was in the range of 55% to 60%. Wall motion was normal;there were no regional wall motion abnormalities. Dopplerparameters are consistent with abnormal left ventricularrelaxation (grade 1 diastolic dysfunction). Doppler parametersare consistent with high ventricular filling pressure. - Aortic valve: There was very mild stenosis. There was trivialregurgitation. - Mitral valve: Calcified annulus. Mildly thickened leaflets . - Left atrium: The atrium was mildly dilated.  Tracheostomy 09/03/2014 by ENT  PICC requested 09/05/2015  EGD 09/04/2015 by Dr. Havery Moros. With possible ulcers in the duodenum and proximal stomach. Blood and clots also noted.     CONSULTS:   GI, urology , ent, oncology, palliative care  Time spent 30 minutes-Greater than 50% of this time was spent in counseling, explanation of diagnosis, planning of further management, and coordination of  care.  MEDICATIONS: Scheduled Meds: . sodium chloride   Intravenous Once  . cefTRIAXone (ROCEPHIN)  IV  1 g Intravenous Q24H  . folic acid  1 mg Intravenous Daily  . Influenza vac split quadrivalent PF  0.5 mL Intramuscular Tomorrow-1000  . insulin aspart  0-9 Units Subcutaneous 4 times per day  . loratadine  10 mg Oral Daily  . magnesium sulfate 1 - 4 g bolus IVPB  1 g Intravenous Once  . potassium chloride  10 mEq Intravenous Q1 Hr x 4  . sodium chloride  10-40 mL Intracatheter Q12H  . thiamine  100 mg Intravenous Daily   Continuous Infusions: . Marland KitchenTPN (CLINIMIX-E) Adult 30 mL/hr at 09/05/15 1754   And  . fat emulsion 240 mL (09/05/15 1754)  . lactated ringers 1,000 mL (09/05/15 2145)  . pantoprozole (PROTONIX) infusion 8 mg/hr (09/06/15 0142)   PRN Meds:.acetaminophen, dextromethorphan, hydrALAZINE, lidocaine, LORazepam, morphine injection, ondansetron **OR** ondansetron (ZOFRAN) IV, oxymetazoline, RESOURCE THICKENUP CLEAR   PHYSICAL EXAM: Vital signs in last 24 hours: Filed Vitals:   09/06/15 0336 09/06/15 0715 09/06/15 0903 09/06/15 0919  BP: 116/75 134/88 157/101 138/77  Pulse: 96 90 97 94  Temp: 98.6 F (37 C) 99.3 F (37.4 C)    TempSrc: Oral Oral    Resp: 22 21 24 17   Height:      Weight:      SpO2: 95% 96% 98% 100%    Weight change:  Filed Weights   08/29/15 1422  Weight: 113.399 kg (250  lb)   Body mass index is 44.3 kg/(m^2).   Gen Exam: Awake and alert with clear speech. Obese.   Neck: Supple-exam limited by body habitus. No stridor, Trach in place Chest:  CVS: S1 S2 regular. Abdomen: soft, non tender Extremities:  Neurologic: Non Focal Psych: cooperative Wounds: N/A.   Intake/Output from previous day:  Intake/Output Summary (Last 24 hours) at 09/06/15 0924 Last data filed at 09/06/15 0900  Gross per 24 hour  Intake   3150 ml  Output   1450 ml  Net   1700 ml     LAB RESULTS: CBC  Recent Labs Lab 09/04/15 0325 09/04/15 1504  09/04/15 2105 09/05/15 0350 09/05/15 1159 09/05/15 2202 09/06/15 0549 09/06/15 0550  WBC 18.1* 18.5*  --  19.1* 20.3* 14.9*  --  12.9*  HGB 6.6* 5.1*  5.6* 10.1* 9.6* 10.6* 9.2*  --  8.2*  HCT 20.7* 15.0*  16.8* 29.4* 27.8* 30.5* 27.5*  --  24.6*  PLT 196 140*  --  103* 113* 113*  --  107*  MCV 86.6 87.5  --  83.2 83.3 83.8  --  84.5  MCH 27.6 29.2  --  28.7 29.0 28.0  --  28.2  MCHC 31.9 33.3  --  34.5 34.8 33.5  --  33.3  RDW 15.4 14.8  --  14.9 15.5 15.6*  --  15.9*  LYMPHSABS 1.3  --   --   --   --   --  1.8  --   MONOABS 0.7  --   --   --   --   --  0.8  --   EOSABS 0.1  --   --   --   --   --  0.2  --   BASOSABS 0.0  --   --   --   --   --  0.0  --     Chemistries   Recent Labs Lab 08/31/15 1028 09/01/15 0511 09/02/15 0547 09/04/15 0325 09/04/15 1504 09/05/15 0350 09/06/15 0549  NA 144 148* 146* 146* 146* 145 142  K 3.9 3.8 3.6 3.7 3.5 3.8 3.3*  CL 113* 116* 112* 116*  --  115* 108  CO2 24 27 24 23   --  25 27  GLUCOSE 174* 127* 101* 216*  --  127* 135*  BUN 36* 28* 22* 17  --  21* 20  CREATININE 0.88 0.84 0.82 0.89  --  0.96 0.93  CALCIUM 8.6* 8.2* 8.4* 7.9*  --  7.1* 7.0*  MG 1.8  --   --   --   --  1.1* 1.6*    CBG:  Recent Labs Lab 09/05/15 1553 09/05/15 1803 09/05/15 2206 09/06/15 0038 09/06/15 0611  GLUCAP 98 114* 151* 130* 114*    GFR Estimated Creatinine Clearance: 79 mL/min (by C-G formula based on Cr of 0.93).  Coagulation profile  Recent Labs Lab 09/04/15 1504  INR 1.34    Cardiac Enzymes No results for input(s): CKMB, TROPONINI, MYOGLOBIN in the last 168 hours.  Invalid input(s): CK  Invalid input(s): POCBNP No results for input(s): DDIMER in the last 72 hours. No results for input(s): HGBA1C in the last 72 hours.  Recent Labs  09/06/15 0549  TRIG 97   No results for input(s): TSH, T4TOTAL, T3FREE, THYROIDAB in the last 72 hours.  Invalid input(s): FREET3 No results for input(s): VITAMINB12, FOLATE, FERRITIN, TIBC,  IRON, RETICCTPCT in the last 72 hours. No results for input(s): LIPASE, AMYLASE in the last  72 hours.  Urine Studies No results for input(s): UHGB, CRYS in the last 72 hours.  Invalid input(s): UACOL, UAPR, USPG, UPH, UTP, UGL, UKET, UBIL, UNIT, UROB, ULEU, UEPI, UWBC, URBC, UBAC, CAST, UCOM, BILUA  MICROBIOLOGY: Recent Results (from the past 240 hour(s))  Culture, Urine     Status: None   Collection Time: 08/29/15  9:09 PM  Result Value Ref Range Status   Specimen Description URINE, CATHETERIZED  Final   Special Requests NONE  Final   Culture >=100,000 COLONIES/mL ESCHERICHIA COLI  Final   Report Status 09/01/2015 FINAL  Final   Organism ID, Bacteria ESCHERICHIA COLI  Final      Susceptibility   Escherichia coli - MIC*    AMPICILLIN >=32 RESISTANT Resistant     CEFAZOLIN <=4 SENSITIVE Sensitive     CEFTRIAXONE <=1 SENSITIVE Sensitive     CIPROFLOXACIN <=0.25 SENSITIVE Sensitive     GENTAMICIN <=1 SENSITIVE Sensitive     IMIPENEM <=0.25 SENSITIVE Sensitive     NITROFURANTOIN <=16 SENSITIVE Sensitive     TRIMETH/SULFA <=20 SENSITIVE Sensitive     AMPICILLIN/SULBACTAM 4 SENSITIVE Sensitive     PIP/TAZO <=4 SENSITIVE Sensitive     * >=100,000 COLONIES/mL ESCHERICHIA COLI  Surgical pcr screen     Status: None   Collection Time: 09/01/15  4:09 AM  Result Value Ref Range Status   MRSA, PCR NEGATIVE NEGATIVE Final   Staphylococcus aureus NEGATIVE NEGATIVE Final    Comment:        The Xpert SA Assay (FDA approved for NASAL specimens in patients over 107 years of age), is one component of a comprehensive surveillance program.  Test performance has been validated by [facility] for patients greater than or equal to 29 year old. It is not intended to diagnose infection nor to guide or monitor treatment.   MRSA PCR Screening     Status: None   Collection Time: 09/04/15  7:03 PM  Result Value Ref Range Status   MRSA by PCR NEGATIVE NEGATIVE Final    Comment:        The  GeneXpert MRSA Assay (FDA approved for NASAL specimens only), is one component of a comprehensive MRSA colonization surveillance program. It is not intended to diagnose MRSA infection nor to guide or monitor treatment for MRSA infections.     RADIOLOGY STUDIES/RESULTS: Dg Chest 2 View  08/29/2015  CLINICAL DATA:  One month history of chest pain and hoarse voice. EXAM: CHEST  2 VIEW COMPARISON:  None. FINDINGS: The heart is borderline and enlarged. Mild tortuosity of the thoracic aorta. Low lung volumes with mild vascular crowding and streaky basilar atelectasis. There also bronchitic changes which could be acute or chronic. No infiltrates or effusions. The bony thorax is intact. IMPRESSION: Acute versus chronic bronchitic change.  No infiltrates or effusions Electronically Signed   By: Marijo Sanes M.D.   On: 08/29/2015 16:15   Ct Soft Tissue Neck W Contrast  08/29/2015  CLINICAL DATA:  60 year old female with abnormal full waist for 1 month. Abdominal pain. Three days of vomiting. Initial encounter. EXAM: CT NECK WITH CONTRAST TECHNIQUE: Multidetector CT imaging of the neck was performed using the standard protocol following the bolus administration of intravenous contrast. CONTRAST:  56mL OMNIPAQUE IOHEXOL 300 MG/ML SOLN in conjunction with contrast enhanced imaging of the chest, abdomen, and pelvis reported separately. COMPARISON:  CT chest abdomen and pelvis from today reported separately FINDINGS: Pharynx and larynx: Bulky supraglottic laryngeal tumor with marked soft tissue enlargement  of the bilateral aryepiglottic folds and anterior commissure up to 17 mm in thickness. Tumor appears inseparable from the undersurface of the strap muscles suggesting extension through the thyroid cartilage bilaterally. Posterior hypopharynx involvement. The epiglottis is thickened, nodular, and distorted. Furthermore, the vallecula is mostly effaced and soft tissue at the base of tongue appears nodular and  thickened. All told, tumor encompasses 37 x 40 x 58 mm (AP by transverse by CC). The other laryngeal cartilages appear spared. The palatine tonsils, soft palate, and nasopharynx appear within normal limits. Superior parapharyngeal spaces and retropharyngeal space are within normal limits. Salivary glands: Sublingual space, submandibular glands, and parotid glands are within normal limits. Thyroid: Coarsely calcified 2.3 cm right thyroid nodule. Subcentimeter left lobe nodule. Lymph nodes: Abnormal right level 3 node measuring 9 mm at the level of the thyroid cartilage (series 1, image 57). Small but asymmetric 5 mm right level IIIa lymph node at the level of the hyoid bone on the right (series 1, image 49). Bilateral level 2A nodes appear symmetric measuring 8-9 mm in thickness. No level 4, 5, or level 1 lymphadenopathy. Vascular: Suboptimal intravascular contrast timing, but major vascular structures in the neck and at the skullbase appear to remain patent. Left greater than right carotid bifurcation calcified atherosclerosis. Limited intracranial: Negative.  Partially empty sella. Visualized orbits: Negative. Mastoids and visualized paranasal sinuses: Visualized paranasal sinuses and mastoids are clear. Skeleton: Mostly absent dentition. Periapical lucency about the residual left mandible molar. No osseous metastatic disease identified. Upper chest: Reported separately today. IMPRESSION: 1. 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 2. Suspect metastatic nodal disease at level 3 on the right (small but asymmetric up to 9 mm nodes). Bilateral level 2 nodes are indeterminate and also measure up to 9 mm individually. No cystic or necrotic nodes in the neck. 3. See CT chest abdomen and pelvis from today reported separately. Electronically Signed   By: Genevie Ann M.D.   On: 08/29/2015 19:56   Ct Chest W Contrast  08/29/2015   CLINICAL DATA:  60 year old female with 2-3 day history of vomiting. Abdominal pain for the past month. Loss of voice. EXAM: CT CHEST, ABDOMEN, AND PELVIS WITH CONTRAST TECHNIQUE: Multidetector CT imaging of the chest, abdomen and pelvis was performed following the standard protocol during bolus administration of intravenous contrast. CONTRAST:  67mL OMNIPAQUE IOHEXOL 300 MG/ML  SOLN COMPARISON:  No priors. FINDINGS: CT CHEST FINDINGS Mediastinum/Lymph Nodes: Heart size is normal. There is no significant pericardial fluid, thickening or pericardial calcification. There is atherosclerosis of the thoracic aorta, the great vessels of the mediastinum and the coronary arteries, including calcified atherosclerotic plaque in the left main, left anterior descending, left circumflex and right coronary arteries. Calcifications of the aortic valve and mitral valve/annulus. No pathologically enlarged mediastinal or hilar lymph nodes. Esophagus is unremarkable in appearance. No axillary lymphadenopathy. Lungs/Pleura: 4 mm subpleural nodule in the lateral segment of the right middle lobe (image 31 of series 4). 4 mm subpleural nodule in the posterior left lower lobe (image 38 of series 4). No other suspicious appearing pulmonary nodules or masses. No acute consolidative airspace disease. No pleural effusions. Musculoskeletal/Soft Tissues: There are no aggressive appearing lytic or blastic lesions noted in the visualized portions of the skeleton. CT ABDOMEN AND PELVIS FINDINGS Hepatobiliary: No cystic or solid hepatic lesions. No intra or extrahepatic biliary ductal dilatation. 7 mm calcified gallstone lying dependently in the gallbladder. No  current findings to suggest an acute cholecystitis at this time. Pancreas: No pancreatic mass. No pancreatic ductal dilatation. No pancreatic or peripancreatic fluid or inflammatory changes. Spleen: Unremarkable. Adrenals/Urinary Tract: In the lower pole of the left kidney there is a 5.9 x  6.1 x 6.3 cm heterogeneously enhancing lesion highly concerning for renal cell carcinoma. The inferior aspect of this lesion comes in very close proximity to the anterior aspect of the left psoas muscle and quadratus lumborum muscle, however, there appears to be an intervening fat plane at this time. The lesion is well separated from the left renal vein. Right kidney and right adrenal gland are normal in appearance. 1.2 x 1.4 cm indeterminate nodule in the lateral limb of the left adrenal gland. No hydroureteronephrosis. Urinary bladder is largely decompressed, but otherwise unremarkable in appearance. Stomach/Bowel: Normal appearance of the stomach. No pathologic dilatation of small bowel or colon. A few scattered colonic diverticulae are noted, without surrounding inflammatory changes to suggest an acute diverticulitis at this time. Appendix is normal. Vascular/Lymphatic: Atherosclerosis throughout the abdominal and pelvic vasculature, without evidence of aneurysm or dissection. Left renal vein is widely patent and separate from the lower pole mass. No lymphadenopathy noted in the abdomen or pelvis. Reproductive: Uterus and ovaries are unremarkable in appearance. Other: No significant volume of ascites.  No pneumoperitoneum. Musculoskeletal: There are no aggressive appearing lytic or blastic lesions noted in the visualized portions of the skeleton. IMPRESSION: 1. No acute findings in the abdomen or pelvis to account for the patient's symptoms. 2. However, there is a 5.9 x 6.1 x 6.3 cm heterogeneously enhancing mass in the lower pole of the left kidney highly concerning for renal cell carcinoma. At this time, the lesion appears likely to be encapsulated within Gerota's fascia (although it comes very close to the left psoas and quadratus lumborum musculature), does not involve the left renal vein, and does not appear to be associated with lymphadenopathy. Nonemergent Urologic consultation for surgical resection is  strongly recommended in the near future. 3. 1.2 x 1.4 cm indeterminate nodule in the left adrenal gland. Attention on followup studies is recommended, as a metastatic lesion is not excluded. 4. 4 mm subpleural nodules in the right middle lobe and left lower lobe. These are highly nonspecific, and favored to represent subpleural lymph nodes, but attention on followup studies is recommended to ensure stability. 5. Cholelithiasis without evidence of acute cholecystitis at this time. 6. Atherosclerosis, including left main and 3 vessel coronary artery disease. Please note that although the presence of coronary artery calcium documents the presence of coronary artery disease, the severity of this disease and any potential stenosis cannot be assessed on this non-gated CT examination. Assessment for potential risk factor modification, dietary therapy or pharmacologic therapy may be warranted, if clinically indicated. 7. There are calcifications of the aortic valve and mitral valve/annulus. Echocardiographic correlation for evaluation of potential valvular dysfunction may be warranted if clinically indicated. 8. Colonic diverticulosis without evidence of acute diverticulitis at this time. 9. Additional incidental findings, as above. These results were called by telephone at the time of interpretation on 08/29/2015 at 7:34 pm to Dr. Davonna Belling, who verbally acknowledged these results. Electronically Signed   By: Vinnie Langton M.D.   On: 08/29/2015 19:37   Ct Abdomen Pelvis W Contrast  08/29/2015  CLINICAL DATA:  60 year old female with 2-3 day history of vomiting. Abdominal pain for the past month. Loss of voice. EXAM: CT CHEST, ABDOMEN, AND PELVIS WITH CONTRAST TECHNIQUE: Multidetector CT  imaging of the chest, abdomen and pelvis was performed following the standard protocol during bolus administration of intravenous contrast. CONTRAST:  30mL OMNIPAQUE IOHEXOL 300 MG/ML  SOLN COMPARISON:  No priors. FINDINGS: CT  CHEST FINDINGS Mediastinum/Lymph Nodes: Heart size is normal. There is no significant pericardial fluid, thickening or pericardial calcification. There is atherosclerosis of the thoracic aorta, the great vessels of the mediastinum and the coronary arteries, including calcified atherosclerotic plaque in the left main, left anterior descending, left circumflex and right coronary arteries. Calcifications of the aortic valve and mitral valve/annulus. No pathologically enlarged mediastinal or hilar lymph nodes. Esophagus is unremarkable in appearance. No axillary lymphadenopathy. Lungs/Pleura: 4 mm subpleural nodule in the lateral segment of the right middle lobe (image 31 of series 4). 4 mm subpleural nodule in the posterior left lower lobe (image 38 of series 4). No other suspicious appearing pulmonary nodules or masses. No acute consolidative airspace disease. No pleural effusions. Musculoskeletal/Soft Tissues: There are no aggressive appearing lytic or blastic lesions noted in the visualized portions of the skeleton. CT ABDOMEN AND PELVIS FINDINGS Hepatobiliary: No cystic or solid hepatic lesions. No intra or extrahepatic biliary ductal dilatation. 7 mm calcified gallstone lying dependently in the gallbladder. No current findings to suggest an acute cholecystitis at this time. Pancreas: No pancreatic mass. No pancreatic ductal dilatation. No pancreatic or peripancreatic fluid or inflammatory changes. Spleen: Unremarkable. Adrenals/Urinary Tract: In the lower pole of the left kidney there is a 5.9 x 6.1 x 6.3 cm heterogeneously enhancing lesion highly concerning for renal cell carcinoma. The inferior aspect of this lesion comes in very close proximity to the anterior aspect of the left psoas muscle and quadratus lumborum muscle, however, there appears to be an intervening fat plane at this time. The lesion is well separated from the left renal vein. Right kidney and right adrenal gland are normal in appearance. 1.2 x  1.4 cm indeterminate nodule in the lateral limb of the left adrenal gland. No hydroureteronephrosis. Urinary bladder is largely decompressed, but otherwise unremarkable in appearance. Stomach/Bowel: Normal appearance of the stomach. No pathologic dilatation of small bowel or colon. A few scattered colonic diverticulae are noted, without surrounding inflammatory changes to suggest an acute diverticulitis at this time. Appendix is normal. Vascular/Lymphatic: Atherosclerosis throughout the abdominal and pelvic vasculature, without evidence of aneurysm or dissection. Left renal vein is widely patent and separate from the lower pole mass. No lymphadenopathy noted in the abdomen or pelvis. Reproductive: Uterus and ovaries are unremarkable in appearance. Other: No significant volume of ascites.  No pneumoperitoneum. Musculoskeletal: There are no aggressive appearing lytic or blastic lesions noted in the visualized portions of the skeleton. IMPRESSION: 1. No acute findings in the abdomen or pelvis to account for the patient's symptoms. 2. However, there is a 5.9 x 6.1 x 6.3 cm heterogeneously enhancing mass in the lower pole of the left kidney highly concerning for renal cell carcinoma. At this time, the lesion appears likely to be encapsulated within Gerota's fascia (although it comes very close to the left psoas and quadratus lumborum musculature), does not involve the left renal vein, and does not appear to be associated with lymphadenopathy. Nonemergent Urologic consultation for surgical resection is strongly recommended in the near future. 3. 1.2 x 1.4 cm indeterminate nodule in the left adrenal gland. Attention on followup studies is recommended, as a metastatic lesion is not excluded. 4. 4 mm subpleural nodules in the right middle lobe and left lower lobe. These are highly nonspecific, and favored to  represent subpleural lymph nodes, but attention on followup studies is recommended to ensure stability. 5.  Cholelithiasis without evidence of acute cholecystitis at this time. 6. Atherosclerosis, including left main and 3 vessel coronary artery disease. Please note that although the presence of coronary artery calcium documents the presence of coronary artery disease, the severity of this disease and any potential stenosis cannot be assessed on this non-gated CT examination. Assessment for potential risk factor modification, dietary therapy or pharmacologic therapy may be warranted, if clinically indicated. 7. There are calcifications of the aortic valve and mitral valve/annulus. Echocardiographic correlation for evaluation of potential valvular dysfunction may be warranted if clinically indicated. 8. Colonic diverticulosis without evidence of acute diverticulitis at this time. 9. Additional incidental findings, as above. These results were called by telephone at the time of interpretation on 08/29/2015 at 7:34 pm to Dr. Davonna Belling, who verbally acknowledged these results. Electronically Signed   By: Vinnie Langton M.D.   On: 08/29/2015 19:37   Dg Chest Port 1 View  09/04/2015  CLINICAL DATA:  Central line placement. EXAM: PORTABLE CHEST 1 VIEW COMPARISON:  08/29/2015 FINDINGS: Endotracheal tube terminates 6 cm above the carina. Right internal jugular approach central venous catheter seen with tip at the expected location of superior vena cava. Cardiomediastinal silhouette is enlarged, likely exaggerated by portable technique and rotation. Mediastinal contours appear intact. There is no evidence of focal airspace consolidation, pleural effusion or pneumothorax. Lung volumes are low. Osseous structures are without acute abnormality. Soft tissues are grossly normal. IMPRESSION: Low lung volumes. Endotracheal tube 6 cm above the carina. Advancement with 2 cm may be considered. Right-sided central venous catheter in satisfactory position radiographically. Electronically Signed   By: Fidela Salisbury M.D.   On:  09/04/2015 16:06   Dg Swallowing Func-speech Pathology  09/03/2015  Objective Swallowing Evaluation:   Patient Details Name: Ashley Ramsey MRN: [id number] Date of Birth: 1956-01-04 Today's Date: 09/03/2015 Time: SLP Start Time (ACUTE ONLY): 1124-SLP Stop Time (ACUTE ONLY): 1143 SLP Time Calculation (min) (ACUTE ONLY): 19 min Past Medical History: Past Medical History Diagnosis Date . Thyroid disease  . Hypertension  . Neck mass hospitalized 08/29/2015 . Type II diabetes mellitus ([place])  . Left kidney mass  . Anemia, chronic disease  Past Surgical History: Past Surgical History Procedure Laterality Date . No past surgeries   HPI: Pt is 60 y.o. female with h/o hypertension and NIDDM. Pt presented to ED with voice changes, difficult swallowing and abdominal discomfort that have progressed over the last month. 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 Subjective: pt alert, eager for food/drink Assessment / Plan / Recommendation CHL IP CLINICAL IMPRESSIONS 09/03/2015 Therapy Diagnosis Severe pharyngeal phase dysphagia Clinical Impression Pt has a severe pharyngeal dysphaghia secondary to presence of supraglottic mass, which impedes bolus flow and decreased airway closure. All consistencies enter the airway during the swallow even with tsp-sized boluses. Aspiration is usually sensed, but she is not able to expel aspirates from her trachea. A chin tuck paired with an immediate cough facilitates clearance through the pharynx and clearance of penetrates with all consistencies tested; however, all liquids are then re-penetrated and aspirated after the swallow. Purees do not re-enter the airway, although aspiration risk remains due to residue. Recommend Dys 1 diet and pudding thick liquids with a chin tuck and immediate cough following all bites. Impact on safety and function Moderate aspiration risk;Risk for inadequate nutrition/hydration   CHL IP TREATMENT RECOMMENDATION  09/03/2015 Treatment Recommendations Therapy as outlined in treatment plan below   Prognosis 09/03/2015 Prognosis for Safe Diet Advancement Guarded Barriers to Reach Goals -- Barriers/Prognosis Comment -- CHL IP DIET RECOMMENDATION 09/03/2015 SLP Diet Recommendations Dysphagia 1 (Puree) solids;Pudding thick liquid Liquid Administration via Spoon Medication Administration Crushed with puree Compensations Slow rate;Small sips/bites;Chin tuck;Hard cough after swallow Postural Changes Remain semi-upright after after feeds/meals (Comment);Seated upright at 90 degrees   CHL IP OTHER RECOMMENDATIONS 09/03/2015 Recommended Consults -- Oral Care Recommendations Oral care BID Other Recommendations Order thickener from pharmacy;Prohibited food (jello, ice cream, thin soups);Remove water pitcher   CHL IP FOLLOW UP RECOMMENDATIONS 09/03/2015 Follow up Recommendations Outpatient SLP;24 hour supervision/assistance   CHL IP FREQUENCY AND DURATION 09/03/2015 Speech Therapy Frequency (ACUTE ONLY) min 2x/week Treatment Duration 2 weeks      CHL IP ORAL PHASE 09/03/2015 Oral Phase WFL Oral - Pudding Teaspoon -- Oral - Pudding Cup -- Oral - Honey Teaspoon -- Oral - Honey Cup -- Oral - Nectar Teaspoon -- Oral - Nectar Cup -- Oral - Nectar Straw -- Oral - Thin Teaspoon -- Oral - Thin Cup -- Oral - Thin Straw -- Oral - Puree -- Oral - Mech Soft -- Oral - Regular -- Oral - Multi-Consistency -- Oral - Pill -- Oral Phase - Comment --  CHL IP PHARYNGEAL PHASE 09/03/2015 Pharyngeal Phase Impaired Pharyngeal- Pudding Teaspoon -- Pharyngeal -- Pharyngeal- Pudding Cup -- Pharyngeal -- Pharyngeal- Honey Teaspoon Reduced airway/laryngeal closure;Penetration/Aspiration during swallow;Penetration/Apiration after swallow;Pharyngeal residue - pyriform;Pharyngeal residue - posterior pharnyx;Compensatory strategies attempted (with notebox) Pharyngeal Material enters airway, passes BELOW cords and not ejected out despite cough attempt by patient Pharyngeal- Honey  Cup -- Pharyngeal -- Pharyngeal- Nectar Teaspoon Reduced airway/laryngeal closure;Penetration/Aspiration during swallow;Penetration/Apiration after swallow;Pharyngeal residue - pyriform;Pharyngeal residue - posterior pharnyx;Compensatory strategies attempted (with notebox) Pharyngeal Material enters airway, passes BELOW cords and not ejected out despite cough attempt by patient Pharyngeal- Nectar Cup -- Pharyngeal -- Pharyngeal- Nectar Straw -- Pharyngeal -- Pharyngeal- Thin Teaspoon Reduced airway/laryngeal closure;Penetration/Aspiration during swallow;Penetration/Apiration after swallow;Pharyngeal residue - pyriform;Pharyngeal residue - posterior pharnyx;Compensatory strategies attempted (with notebox) Pharyngeal Material enters airway, passes BELOW cords and not ejected out despite cough attempt by patient Pharyngeal- Thin Cup -- Pharyngeal -- Pharyngeal- Thin Straw Reduced airway/laryngeal closure;Penetration/Aspiration during swallow;Penetration/Apiration after swallow;Pharyngeal residue - pyriform;Pharyngeal residue - posterior pharnyx;Compensatory strategies attempted (with notebox) Pharyngeal Material enters airway, passes BELOW cords and not ejected out despite cough attempt by patient Pharyngeal- Puree Reduced airway/laryngeal closure;Penetration/Aspiration during swallow;Compensatory strategies attempted (with notebox);Pharyngeal residue - posterior pharnyx Pharyngeal Material enters airway, remains ABOVE vocal cords and not ejected out Pharyngeal- Mechanical Soft -- Pharyngeal -- Pharyngeal- Regular -- Pharyngeal -- Pharyngeal- Multi-consistency -- Pharyngeal -- Pharyngeal- Pill -- Pharyngeal -- Pharyngeal Comment --  CHL IP CERVICAL ESOPHAGEAL PHASE 09/03/2015 Cervical Esophageal Phase (No Data) Pudding Teaspoon -- Pudding Cup -- Honey Teaspoon -- Honey Cup -- Nectar Teaspoon -- Nectar Cup -- Nectar Straw -- Thin Teaspoon -- Thin Cup -- Thin Straw -- Puree -- Mechanical Soft -- Regular --  Multi-consistency -- Pill -- Cervical Esophageal Comment -- No flowsheet data found. Germain Osgood, M.A. CCC-SLP [phone] Germain Osgood 09/03/2015, 1:33 PM               Signature  Thurnell Lose M.D on 09/06/2015 at 9:24 AM  Between 7am to 7pm - Pager - [phone], After 7pm go to [email or web address] - password [email or web address]  [phone]   LOS: 7 days

## 2015-09-06 NOTE — Progress Notes (Signed)
Adjusted Blood Transfusion times per blood audit sheets in chart, to complete them in charting. The date in question was 09/04/2015. I did not work during that shift; just found the blood to be completed on 09/06/15 on assessment and completed them as appropriate.  Milford Cage, RN

## 2015-09-06 NOTE — Progress Notes (Signed)
Speech Language Pathology Patient Details Name: Ashley Ramsey MRN: [id number] DOB: Oct 27, 1955 Today's Date: 09/06/2015 Time:  -      ST following pt for Passy-Muir speaking valve (initial assessment 1/23). Received order for swallow assessment. MBS performed 1/21 revealing severe pharyngeal dysphagia due to laryngeal mass and recommended Dys 1 and pudding thick liquids. Received trach 1/23 due to hematemesis. ST will await decision and modify treatment plan as necessary. Noted MD plans to discuss nutrition status with pt/family re: PEG. Will continue to see for speaking valve and swallow as needed.   Orbie Pyo [place] [phone]

## 2015-09-06 NOTE — Evaluation (Signed)
Physical Therapy Evaluation Patient Details Name: Ashley Ramsey MRN: [id number] DOB: 11-07-1955 Today's Date: 09/06/2015   History of Present Illness  60yo female with hx HTN, DM who initially presented 1/16 with hoarseness and difficulty swallowing. CT neck revealed laryngeal mass and CT abd revealed renal mass. She was seen in consultation by ENT and underwent tracheostomy placement 1/22. Course was c/b GI bleed. EGD performed on 1/22 revealed large duodenal ulcer which was actively bleeding and was clipped. Has laryngeal and renal Cancer.  Clinical Impression  Pt admitted with above diagnosis. Pt currently with functional limitations due to the deficits listed below (see PT Problem List). Pt was able to get to chair but is deconditioned and feel that SNF will maximize pts function so she can return home at prior functional level.  Was caring for her blind sister (would run sister's water and set things up for her - no physical assist) and will need to get stronger prior to going back home.  Will follow acutely.  Pt will benefit from skilled PT to increase their independence and safety with mobility to allow discharge to the venue listed below.      Follow Up Recommendations SNF;Supervision/Assistance - 24 hour    Equipment Recommendations  Other (comment) (TBA)    Recommendations for Other Services       Precautions / Restrictions Precautions Precautions: Fall Restrictions Weight Bearing Restrictions: No      Mobility  Bed Mobility Overal bed mobility: Needs Assistance Bed Mobility: Supine to Sit     Supine to sit: Supervision     General bed mobility comments: incr time for pt to come to EOB.  Transfers Overall transfer level: Needs assistance Equipment used: 2 person hand held assist Transfers: Sit to/from [facility] Sit to Stand: Min assist Stand pivot transfers: Min guard       General transfer comment: Needed assist to power up with UE  support.  Took pivotal steps around with steadying assist.   Ambulation/Gait                Stairs            Wheelchair Mobility    Modified Rankin (Stroke Patients Only)       Balance Overall balance assessment: Needs assistance Sitting-balance support: No upper extremity supported;Feet supported Sitting balance-Leahy Scale: Good Sitting balance - Comments: put her socks on by herself.    Standing balance support: Bilateral upper extremity supported;During functional activity Standing balance-Leahy Scale: Poor Standing balance comment: Needed bil UE support with standing statically.                             Pertinent Vitals/Pain Pain Assessment: No/denies pain  O2 90% and > on 28% trach collar with activity. Other VSS.     Home Living Family/patient expects to be discharged to:: Private residence Living Arrangements: Other relatives (children and sister) Available Help at Discharge: Family;Available 24 hours/day (blind sister lives with pt - she was caregiver for her) Type of Home: House Home Access: Stairs to enter Entrance Stairs-Rails: None Entrance Stairs-Number of Steps: 3 Home Layout: One level Home Equipment: None      Prior Function Level of Independence: Independent               Hand Dominance        Extremity/Trunk Assessment   Upper Extremity Assessment: Defer to OT evaluation  Lower Extremity Assessment: Generalized weakness      Cervical / Trunk Assessment: Normal  Communication   Communication: Tracheostomy  Cognition Arousal/Alertness: Awake/alert Behavior During Therapy: WFL for tasks assessed/performed Overall Cognitive Status: Within Functional Limits for tasks assessed                      General Comments      Exercises General Exercises - Lower Extremity Ankle Circles/Pumps: AROM;Both;10 reps;Seated Long Arc Quad: AROM;Both;10 reps;Seated      Assessment/Plan     PT Assessment Patient needs continued PT services  PT Diagnosis Generalized weakness   PT Problem List Decreased activity tolerance;Decreased balance;Decreased mobility;Decreased knowledge of use of DME;Decreased safety awareness;Decreased knowledge of precautions  PT Treatment Interventions DME instruction;Gait training;Therapeutic activities;Functional mobility training;Stair training;Therapeutic exercise;Balance training;Patient/family education   PT Goals (Current goals can be found in the Care Plan section) Acute Rehab PT Goals Patient Stated Goal: to get better PT Goal Formulation: With patient Time For Goal Achievement: 09/20/15 Potential to Achieve Goals: Good    Frequency Min 3X/week   Barriers to discharge        Co-evaluation               End of Session Equipment Utilized During Treatment: Gait belt;Oxygen (trach at 28%) Activity Tolerance: Patient limited by fatigue Patient left: in chair;with call bell/phone within reach;with chair alarm set Nurse Communication: Mobility status         Time: [id number] PT Time Calculation (min) (ACUTE ONLY): 24 min   Charges:   PT Evaluation $PT Eval Moderate Complexity: 1 Procedure PT Treatments $Therapeutic Activity: 8-22 mins   PT G CodesIrwin Brakeman F 09-16-15, 11:55 AM [name] [phone] (pager)

## 2015-09-06 NOTE — Progress Notes (Signed)
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   No Known Allergies  Patient Measurements: Height: 5\' 3"  (160 cm) Weight: 250 lb (113.399 kg) IBW/kg (Calculated) : 52.4 Adjusted Body Weight: 67 kg  Usual Weight: 113.4 BMI = 44   Vital Signs: Temp: 99.3 F (37.4 C) (01/24 0715) Temp Source: Oral (01/24 0715) BP: 134/88 mmHg (01/24 0715) Pulse Rate: 90 (01/24 0715) Intake/Output from previous day: 01/23 0701 - 01/24 0700 In: 2510 [I.V.:1790; IV Piggyback:276; TPN:444] Out: 1450 [Urine:1450] Intake/Output from this shift:    Labs:  Recent Labs  09/04/15 1504  09/05/15 1159 09/05/15 2202 09/06/15 0550  WBC 18.5*  < > 20.3* 14.9* 12.9*  HGB 5.1*  5.6*  < > 10.6* 9.2* 8.2*  HCT 15.0*  16.8*  < > 30.5* 27.5* 24.6*  PLT 140*  < > 113* 113* 107*  APTT 26  --   --   --   --   INR 1.34  --   --   --   --   < > = values in this interval not displayed.   Recent Labs  09/04/15 0325 09/04/15 1504 09/05/15 0350 09/06/15 0549  NA 146* 146* 145 142  K 3.7 3.5 3.8 3.3*  CL 116*  --  115* 108  CO2 23  --  25 27  GLUCOSE 216*  --  127* 135*  BUN 17  --  21* 20  CREATININE 0.89  --  0.96 0.93  CALCIUM 7.9*  --  7.1* 7.0*  MG  --   --  1.1* 1.6*  PHOS  --   --  2.6 3.0  PROT  --   --   --  3.9*  ALBUMIN  --   --   --  1.5*  AST  --   --   --  13*  ALT  --   --   --  9*  ALKPHOS  --   --   --  48  BILITOT  --   --   --  0.3  PREALBUMIN  --   --   --  8.7*  TRIG  --   --   --  97   Estimated Creatinine Clearance: 79 mL/min (by C-G formula based on Cr of 0.93).    Recent Labs  09/05/15 2206 09/06/15 0038 09/06/15 0611  GLUCAP 151* 130* 114*    Medical History: Past Medical History  Diagnosis Date  . Thyroid disease   . Hypertension   . Neck mass hospitalized 08/29/2015  . Type II diabetes mellitus ([facility])   . Left kidney mass   . Anemia, chronic disease     Medications:   Prescriptions prior to admission  Medication Sig Dispense Refill Last Dose  . ibuprofen (ADVIL,MOTRIN) 200 MG tablet Take 200 mg by mouth daily as needed for moderate pain.   08/28/2015 at Unknown time  . lisinopril-hydrochlorothiazide (PRINZIDE,ZESTORETIC) 10-12.5 MG per tablet Take 1 tablet by mouth daily. 90 tablet 3 1 year  . metFORMIN (GLUCOPHAGE XR) 500 MG 24 hr tablet Take 1 tablet (500 mg total) by mouth daily with breakfast. 30 tablet 3 1 year  . Vitamin D, Ergocalciferol, (DRISDOL) 50000 UNITS CAPS capsule Take 1 capsule (50,000 Units total) by mouth every 7 (seven) days. 12 capsule 0 1 year  . glucose monitoring kit (FREESTYLE) monitoring kit 1 each by Does not apply route 3 (three) times daily before meals.  1 each 0   . nicotine (NICODERM CQ - DOSED IN MG/24 HOURS) 21 mg/24hr patch Place 1 patch (21 mg total) onto the skin daily. (Patient not taking: Reported on 08/29/2015) 28 patch 0 Not Taking at Unknown time    Insulin Requirements in the past 24 hours:  None   Assessment: 24 YOF who presented with worsening hoarseness of voice and abdominal pain. She had difficulty swallowing foods, N/V, weight loss and no appetite. CT neck found laryngeal mass and CT abdmonen revealed a left renal mass. She was also found to have a UTI with SIRS. Admitted for IV antibiotics. Underwent a biopsy by ENT on 1/20. Hospital course also complicated by development of lower GI bleeding and associated blood loss anemia   Surgeries/Procedures:  GI: S/p upper GI bleed on 09/04/15 requiring multiple units of PRBC. GI/ENT consulted. Underwent tracheostomy placement by ENT on 1/23 to secure airway. She will be nothing by mouth for a while and PEG tube not possible due to likely stomach and duodenal ulcers. On PPI drip. Prealbumin 8.7  Endo: H/o DM, CBGs 98-151, on SSI  Lytes: Na down to 142, CorrCa 9, Mg 1.1>>1.6, Phos 2.6>>3, K 3.8>>3.3 (supplemented x 4 runs) Renal: SCr 0.93, CrCl ~ 75-80 mL/min, LR @ 10  mL/hr  Pulm: Trach collar; 28% FiO2 on 5L  Cards: H/o HTN, VSS  Hepatobil: LFTs not elevated  Neuro: Probable head and neck cancer. Per oncology, she is poor candidate for chemotherapy and most likely has renal cancer. Prognosis is extremely. Oncology recommends palliative radiation treatments and palliative care consult. On folic acid and thiamine  Heme: Hgb down to 8.2, Plt 107k ID: WBC down to 12.9, On ceftriaxone for E.coli UTI  Best Practices: PPI, SCDs  TPN Access: PICC line 1/23>> TPN start date: 1/23>>   Current Nutrition:  Clinimix-E 5/15 to 30 mL/hr and 20% IVFE @ 10 mL/hr  Nutritional Goals:  Kcal: 1700-2000 Protein: 105-120 gm Fluid: 1.7-2 L  Plan:  -Increase Clinimix-E 5/15 to 50 mL/hr and 20% IVFE @ 10 mL/hr. This will provide 60 gm of protein (~57% of goal) and 1332 kCal (~78% of goal). Advance to goal as tolerated  -Monitor CBGs as TPN is advanced.  -MgSO4 1 gm IV once  -Add MVI and TE to TPN bag  -Order TPN labs -Monitor for s/s of refeeding    Albertina Parr, PharmD., BCPS Clinical Pharmacist Pager [phone]

## 2015-09-06 NOTE — Progress Notes (Signed)
Daily Progress Note   Patient Name: Ashley Ramsey       Date: 09/06/2015 DOB: 11-28-1955  Age: 60 y.o. MRN#: [id number] Attending Physician: Thurnell Lose, MD Primary Care Physician: Lorayne Marek, MD Admit Date: 08/29/2015  Reason for Consultation/Follow-up: Establishing goals of care  Subjective: I met again today with Ashley Ramsey. Happy to see her up in chair and she says that the work with PT went well. She is hoping to be able to go home soon. Discussed plan to f/u with radiation oncology. Also discussed possibility of PEG tube and asked how she felt about this. When I brought this up she seemed to shut down and did not seem to want to talk anymore. Respected her boundaries and left her to her thoughts - I know all this is very overwhelming. Attempted to provide emotional support.   Length of Stay: 7 days  Current Medications: Scheduled Meds:  . sodium chloride   Intravenous Once  . cefTRIAXone (ROCEPHIN)  IV  1 g Intravenous Q24H  . folic acid  1 mg Intravenous Daily  . Influenza vac split quadrivalent PF  0.5 mL Intramuscular Tomorrow-1000  . insulin aspart  0-9 Units Subcutaneous 4 times per day  . loratadine  10 mg Oral Daily  . magnesium sulfate 1 - 4 g bolus IVPB  1 g Intravenous Once  . potassium chloride  10 mEq Intravenous Q1 Hr x 4  . sodium chloride  10-40 mL Intracatheter Q12H  . thiamine  100 mg Intravenous Daily    Continuous Infusions: . Marland KitchenTPN (CLINIMIX-E) Adult     And  . fat emulsion    . Marland KitchenTPN (CLINIMIX-E) Adult 30 mL/hr at 09/05/15 1754   And  . fat emulsion 240 mL (09/05/15 1754)  . lactated ringers 1,000 mL (09/05/15 2145)  . pantoprozole (PROTONIX) infusion 8 mg/hr (09/06/15 0142)    PRN Meds: acetaminophen, dextromethorphan, hydrALAZINE,  lidocaine, LORazepam, morphine injection, ondansetron **OR** ondansetron (ZOFRAN) IV, oxymetazoline, RESOURCE THICKENUP CLEAR  Physical Exam: Physical Exam  Constitutional: She is oriented to person, place, and time. She appears well-developed and well-nourished.  HENT:  + trach  Cardiovascular: Normal rate.   Pulmonary/Chest: Effort normal. No accessory muscle usage. No tachypnea. No respiratory distress.  Abdominal: Soft. Normal appearance.  Neurological: She is alert and oriented to  person, place, and time.                Vital Signs: BP 138/77 mmHg  Pulse 94  Temp(Src) 99.3 F (37.4 C) (Oral)  Resp 17  Ht _0  (1.6 m)  Wt 113.399 kg (250 lb)  BMI 44.30 kg/m2  SpO2 100% SpO2: SpO2: 100 % O2 Device: O2 Device: Tracheostomy Collar O2 Flow Rate: O2 Flow Rate (L/min): 5 L/min  Intake/output summary:  Intake/Output Summary (Last 24 hours) at 09/06/15 1157 Last data filed at 09/06/15 1006  Gross per 24 hour  Intake   2990 ml  Output   1775 ml  Net   1215 ml   LBM: Last BM Date: 09/05/15 Baseline Weight: Weight: 113.399 kg (250 lb) Most recent weight: Weight: 113.399 kg (250 lb)       Palliative Assessment/Data:   Additional Data Reviewed: CBC    Component Value Date/Time   WBC 12.9* 09/06/2015 0550   RBC 2.91* 09/06/2015 0550   RBC 2.84* 08/30/2015 1630   HGB 8.2* 09/06/2015 0550   HCT 24.6* 09/06/2015 0550   PLT 107* 09/06/2015 0550   MCV 84.5 09/06/2015 0550   MCH 28.2 09/06/2015 0550   MCHC 33.3 09/06/2015 0550   RDW 15.9* 09/06/2015 0550   LYMPHSABS 1.8 09/06/2015 0549   MONOABS 0.8 09/06/2015 0549   EOSABS 0.2 09/06/2015 0549   BASOSABS 0.0 09/06/2015 0549    CMP     Component Value Date/Time   NA 142 09/06/2015 0549   K 3.3* 09/06/2015 0549   CL 108 09/06/2015 0549   CO2 27 09/06/2015 0549   GLUCOSE 135* 09/06/2015 0549   BUN 20 09/06/2015 0549   CREATININE 0.93 09/06/2015 0549   CREATININE 0.78 08/17/2013 0929   CALCIUM 7.0* 09/06/2015  0549   PROT 3.9* 09/06/2015 0549   ALBUMIN 1.5* 09/06/2015 0549   AST 13* 09/06/2015 0549   ALT 9* 09/06/2015 0549   ALKPHOS 48 09/06/2015 0549   BILITOT 0.3 09/06/2015 0549   GFRNONAA >60 09/06/2015 0549   GFRNONAA 85 08/17/2013 0929   GFRAA >60 09/06/2015 0549   GFRAA >89 08/17/2013 0929       Problem List:  Patient Active Problem List   Diagnosis Date Noted  . Palliative care encounter   . Dysphagia   . Laryngeal cancer ([place])   . GI bleed   . Neck mass 08/29/2015  . Renal mass 08/29/2015  . AKI (acute kidney injury) ([place]) 08/29/2015  . Abnormal TSH 08/21/2013  . Unspecified vitamin D deficiency 08/21/2013  . Essential hypertension, benign 08/21/2013  . Smoking 08/21/2013  . Type 2 diabetes mellitus without complication, without long-term current use of insulin ([place]) 07/17/2013     Palliative Care Assessment & Plan    1.Code Status:  Full code    Code Status Orders        Start     Ordered   08/29/15 2219  Full code   Continuous     08/29/15 2218    Code Status History    Date Active Date Inactive Code Status Order ID Comments User Context   This patient has a current code status but no historical code status.       2. Goals of Care/Additional Recommendations:  Full aggressive care.   Limitations on Scope of Treatment: Full Scope Treatment  Desire for further Chaplaincy support:no  Psycho-social Needs: Caregiving  Support/Resources  3. Symptom Management:  No current symptoms.    Trach placed for  airway protection.  Malnutrition: TPN started. Will likely need PEG in future after duodenal ulcers healed.  4. Palliative Prophylaxis:   Bowel Regimen and Delirium Protocol  5. Prognosis: Unable to determine  6. Discharge Planning:  To be determined.    Thank you for allowing the Palliative Medicine Team to assist in the care of this patient.   Time In: 1045 Time Out: 1105 Total Time 46mn Prolonged Time Billed  no          APershing Proud NP  16/76/7209 11:57 AM  Please contact Palliative Medicine Team phone at [phone] questions and concerns.

## 2015-09-06 NOTE — Progress Notes (Signed)
Patient ID: Ashley Ramsey, female   DOB: [birth year], 60 y.o.   MRN: [id number]  Pathology reveals squamous cell carcinoma. This is a supraglottic laryngeal tumor with involvement of the right pyriform sinus medial wall, and possibly the lateral wall. This is staged as a T3/T4 tumor, with suspicious but small bilateral nodes which makes it a radiographic N2b. Stage 4 disease usually requires either chmo/XRT or laryngopharyngectomy which would likely require free flap reconstruction. Due to the reconstructive needs, this would need to be done at [facility]). I will review the case with Rad Onc.

## 2015-09-07 ENCOUNTER — Inpatient Hospital Stay (HOSPITAL_COMMUNITY): Payer: Medicaid Other

## 2015-09-07 LAB — BASIC METABOLIC PANEL
ANION GAP: 17 — AB (ref 5–15)
BUN: 13 mg/dL (ref 6–20)
CO2: 23 mmol/L (ref 22–32)
Calcium: 7.2 mg/dL — ABNORMAL LOW (ref 8.9–10.3)
Chloride: 96 mmol/L — ABNORMAL LOW (ref 101–111)
Creatinine, Ser: 0.82 mg/dL (ref 0.44–1.00)
GLUCOSE: 433 mg/dL — AB (ref 65–99)
POTASSIUM: 4.1 mmol/L (ref 3.5–5.1)
Sodium: 136 mmol/L (ref 135–145)

## 2015-09-07 LAB — GLUCOSE, CAPILLARY
GLUCOSE-CAPILLARY: 143 mg/dL — AB (ref 65–99)
Glucose-Capillary: 152 mg/dL — ABNORMAL HIGH (ref 65–99)
Glucose-Capillary: 120 mg/dL — ABNORMAL HIGH (ref 65–99)

## 2015-09-07 LAB — TYPE AND SCREEN
ABO/RH(D): B POS
Antibody Screen: NEGATIVE

## 2015-09-07 LAB — CBC
HEMATOCRIT: 24.8 % — AB (ref 36.0–46.0)
HEMOGLOBIN: 8.3 g/dL — AB (ref 12.0–15.0)
MCH: 29.3 pg (ref 26.0–34.0)
MCHC: 33.5 g/dL (ref 30.0–36.0)
MCV: 87.6 fL (ref 78.0–100.0)
Platelets: 100 10*3/uL — ABNORMAL LOW (ref 150–400)
RBC: 2.83 MIL/uL — AB (ref 3.87–5.11)
RDW: 16.3 % — ABNORMAL HIGH (ref 11.5–15.5)
WBC: 12.2 10*3/uL — ABNORMAL HIGH (ref 4.0–10.5)

## 2015-09-07 LAB — HEMOGLOBIN AND HEMATOCRIT, BLOOD
HCT: 25.8 % — ABNORMAL LOW (ref 36.0–46.0)
HEMATOCRIT: 25.6 % — AB (ref 36.0–46.0)
HEMOGLOBIN: 8.7 g/dL — AB (ref 12.0–15.0)
HEMOGLOBIN: 8.4 g/dL — AB (ref 12.0–15.0)

## 2015-09-07 LAB — MAGNESIUM: MAGNESIUM: 1.9 mg/dL (ref 1.7–2.4)

## 2015-09-07 MED ORDER — FENTANYL CITRATE (PF) 100 MCG/2ML IJ SOLN
INTRAMUSCULAR | Status: AC | PRN
  Administered 2015-09-07: 25 ug via INTRAVENOUS
  Administered 2015-09-07: 50 ug via INTRAVENOUS

## 2015-09-07 MED ORDER — IOHEXOL 300 MG/ML  SOLN
250.0000 mL | Freq: Once | INTRAMUSCULAR | Status: AC | PRN
  Administered 2015-09-07: 60 mL via INTRAVENOUS

## 2015-09-07 MED ORDER — SODIUM CHLORIDE 0.9 % IV SOLN
8.0000 mg/h | INTRAVENOUS | Status: AC
  Administered 2015-09-07 – 2015-09-12 (×9): 8 mg/h via INTRAVENOUS
  Filled 2015-09-07 (×21): qty 80

## 2015-09-07 MED ORDER — SODIUM CHLORIDE 0.9 % IV SOLN
INTRAVENOUS | Status: AC | PRN
  Administered 2015-09-07: 10 mL/h via INTRAVENOUS

## 2015-09-07 MED ORDER — MIDAZOLAM HCL 2 MG/2ML IJ SOLN
INTRAMUSCULAR | Status: AC
  Filled 2015-09-07: qty 4

## 2015-09-07 MED ORDER — FAT EMULSION 20 % IV EMUL
240.0000 mL | INTRAVENOUS | Status: AC
  Administered 2015-09-07: 240 mL via INTRAVENOUS
  Filled 2015-09-07: qty 250

## 2015-09-07 MED ORDER — TRACE MINERALS CR-CU-MN-SE-ZN 10-1000-500-60 MCG/ML IV SOLN
INTRAVENOUS | Status: DC
  Filled 2015-09-07 (×2): qty 1200

## 2015-09-07 MED ORDER — LIDOCAINE HCL 1 % IJ SOLN
INTRAMUSCULAR | Status: AC
  Filled 2015-09-07: qty 20

## 2015-09-07 MED ORDER — M.V.I. ADULT IV INJ
INJECTION | INTRAVENOUS | Status: AC
  Administered 2015-09-07: 18:00:00 via INTRAVENOUS
  Filled 2015-09-07: qty 1680

## 2015-09-07 MED ORDER — FAT EMULSION 20 % IV EMUL
240.0000 mL | INTRAVENOUS | Status: DC
  Filled 2015-09-07: qty 250

## 2015-09-07 MED ORDER — MIDAZOLAM HCL 2 MG/2ML IJ SOLN
INTRAMUSCULAR | Status: AC | PRN
  Administered 2015-09-07: 0.5 mg via INTRAVENOUS
  Administered 2015-09-07: 1 mg via INTRAVENOUS

## 2015-09-07 MED ORDER — FENTANYL CITRATE (PF) 100 MCG/2ML IJ SOLN
INTRAMUSCULAR | Status: AC
  Filled 2015-09-07: qty 4

## 2015-09-07 NOTE — Consult Note (Signed)
Chief Complaint: Patient was seen in consultation today for mesenteric arteriogram with possible embolization Chief Complaint  Patient presents with  . Abdominal Pain  . Back Pain  . Hoarse   at the request of Dr Candiss Norse; Dr Collene Mares  Referring Physician(s): Dr Candiss Norse  History of Present Illness: Ashley Ramsey is a 60 y.o. 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  Now with new bloody stool this am H/H 1300: 8.7/25.6 Request for mesenteric arteriogram with probable embolization per Dr Singh/Dr Collene Mares    Past Medical History  Diagnosis Date  . Thyroid disease   . Hypertension   . Neck mass hospitalized 08/29/2015  . Type II diabetes mellitus ([place])   . Left kidney mass   . Anemia, chronic disease     Past Surgical History  Procedure Laterality Date  . No past surgeries    . Esophagogastroduodenoscopy N/A 09/04/2015    Procedure: ESOPHAGOGASTRODUODENOSCOPY (EGD);  Surgeon: Manus Gunning, MD;  Location: [place];  Service: Gastroenterology;  Laterality: N/A;  . Direct laryngoscopy N/A 09/02/2015    Procedure: DIRECT LARYNGOSCOPY WITH BIOPSY;  Surgeon: Izora Gala, MD;  Location: [place];  Service: ENT;  Laterality: N/A;  . Esophagoscopy N/A 09/02/2015    Procedure: ESOPHAGOSCOPY;  Surgeon: Izora Gala, MD;  Location: [place];  Service: ENT;  Laterality: N/A;  . Tracheostomy tube placement N/A 09/04/2015    Procedure: TRACHEOSTOMY;  Surgeon: Izora Gala, MD;  Location: [place];  Service: ENT;  Laterality: N/A;  .  Direct laryngoscopy  09/04/2015    Procedure: DIRECT View LARYNGOSCOPY with cautery of of tumor;  Surgeon: Izora Gala, MD;  Location: [place];  Service: ENT;;    Allergies: Review of patient's allergies indicates no known allergies.  Medications: Prior to Admission medications   Medication Sig Start Date End Date Taking? Authorizing Provider  ibuprofen (ADVIL,MOTRIN) 200 MG tablet Take 200 mg by mouth daily as needed for moderate pain.   Yes Historical Provider, MD  lisinopril-hydrochlorothiazide (PRINZIDE,ZESTORETIC) 10-12.5 MG per tablet Take 1 tablet by mouth daily. 08/21/13  Yes Lorayne Marek, MD  metFORMIN (GLUCOPHAGE XR) 500 MG 24 hr tablet Take 1 tablet (500 mg total) by mouth daily with breakfast. 08/18/13  Yes Lorayne Marek, MD  Vitamin D, Ergocalciferol, (DRISDOL) 50000 UNITS CAPS capsule Take 1 capsule (50,000 Units total) by mouth every 7 (seven) days. 08/18/13  Yes Lorayne Marek, MD  glucose monitoring kit (FREESTYLE) monitoring kit 1 each by Does not apply route 3 (three) times daily before meals. 07/17/13   Lorayne Marek, MD  nicotine (NICODERM CQ - DOSED IN MG/24 HOURS) 21 mg/24hr patch Place 1 patch (21 mg total) onto the skin daily. Patient not taking: Reported on 08/29/2015 07/17/13   Lorayne Marek, MD     Family History  Problem Relation Age of Onset  . Diabetes Mother   . Hypertension Mother   . Heart disease Mother   . Diabetes Sister   . Hypertension Sister   . Cancer Daughter     Stomach  Social History   Social History  . Marital Status: Single    Spouse Name: N/A  . Number of Children: N/A  . Years of Education: N/A   Social History Main Topics  . Smoking status: Former Smoker -- 0.50 packs/day for 34 years    Types: Cigarettes    Quit date: 07/14/2015  . Smokeless tobacco: Never Used  . Alcohol Use: 15.0 oz/week    25 Shots of liquor per week     Comment: 08/29/2015 "I drink 1/5th liquor one 1 day/week"  . Drug Use: No  . Sexual Activity: Not Currently       Comment: occassional    Other Topics Concern  . None   Social History Narrative     Review of Systems: A 12 point ROS discussed and pertinent positives are indicated in the HPI above.  All other systems are negative.  Review of Systems  Constitutional: Positive for activity change, appetite change and fatigue. Negative for fever.  Respiratory: Negative for cough and shortness of breath.   Cardiovascular: Negative for chest pain.  Gastrointestinal: Positive for nausea, blood in stool and anal bleeding.  Neurological: Positive for weakness.  Psychiatric/Behavioral: Negative for behavioral problems and confusion.    Vital Signs: BP 141/76 mmHg  Pulse 77  Temp(Src) 98.6 F (37 C) (Oral)  Resp 15  Ht _0  (1.6 m)  Wt 250 lb (113.399 kg)  BMI 44.30 kg/m2  SpO2 98%  Physical Exam  Constitutional: She is oriented to person, place, and time.  trach  Cardiovascular: Normal rate, regular rhythm and normal heart sounds.   Pulmonary/Chest: Effort normal and breath sounds normal.  Abdominal: Soft. Bowel sounds are normal. There is no tenderness.  Musculoskeletal: Normal range of motion.  Neurological: She is alert and oriented to person, place, and time.  Skin: Skin is warm and dry.  Psychiatric: She has a normal mood and affect. Her behavior is normal. Judgment and thought content normal.  Nursing note and vitals reviewed.   Mallampati Score:  MD Evaluation Airway: Other (comments) Airway comments: trach Heart: WNL Abdomen: WNL Chest/ Lungs: WNL ASA  Classification: 3 Mallampati/Airway Score: Three  Imaging: Dg Chest 2 View  08/29/2015  CLINICAL DATA:  One month history of chest pain and hoarse voice. EXAM: CHEST  2 VIEW COMPARISON:  None. FINDINGS: The heart is borderline and enlarged. Mild tortuosity of the thoracic aorta. Low lung volumes with mild vascular crowding and streaky basilar atelectasis. There also bronchitic changes which could be acute or chronic. No  infiltrates or effusions. The bony thorax is intact. IMPRESSION: Acute versus chronic bronchitic change.  No infiltrates or effusions Electronically Signed   By: Marijo Sanes M.D.   On: 08/29/2015 16:15   Ct Soft Tissue Neck W Contrast  08/29/2015  CLINICAL DATA:  60 year old female with abnormal full waist for 1 month. Abdominal pain. Three days of vomiting. Initial encounter. EXAM: CT NECK WITH CONTRAST TECHNIQUE: Multidetector CT imaging of the neck was performed using the standard protocol following the bolus administration of intravenous contrast. CONTRAST:  30m OMNIPAQUE IOHEXOL 300 MG/ML SOLN in conjunction with contrast enhanced imaging of the chest, abdomen, and pelvis reported separately. COMPARISON:  CT chest abdomen and pelvis from today reported separately FINDINGS: Pharynx and larynx: Bulky supraglottic laryngeal tumor with marked soft tissue enlargement of the bilateral aryepiglottic folds and anterior commissure up to 17 mm in thickness. Tumor appears inseparable from the undersurface of the strap muscles suggesting extension through the thyroid cartilage  bilaterally. Posterior hypopharynx involvement. The epiglottis is thickened, nodular, and distorted. Furthermore, the vallecula is mostly effaced and soft tissue at the base of tongue appears nodular and thickened. All told, tumor encompasses 37 x 40 x 58 mm (AP by transverse by CC). The other laryngeal cartilages appear spared. The palatine tonsils, soft palate, and nasopharynx appear within normal limits. Superior parapharyngeal spaces and retropharyngeal space are within normal limits. Salivary glands: Sublingual space, submandibular glands, and parotid glands are within normal limits. Thyroid: Coarsely calcified 2.3 cm right thyroid nodule. Subcentimeter left lobe nodule. Lymph nodes: Abnormal right level 3 node measuring 9 mm at the level of the thyroid cartilage (series 1, image 57). Small but asymmetric 5 mm right level IIIa lymph node  at the level of the hyoid bone on the right (series 1, image 49). Bilateral level 2A nodes appear symmetric measuring 8-9 mm in thickness. No level 4, 5, or level 1 lymphadenopathy. Vascular: Suboptimal intravascular contrast timing, but major vascular structures in the neck and at the skullbase appear to remain patent. Left greater than right carotid bifurcation calcified atherosclerosis. Limited intracranial: Negative.  Partially empty sella. Visualized orbits: Negative. Mastoids and visualized paranasal sinuses: Visualized paranasal sinuses and mastoids are clear. Skeleton: Mostly absent dentition. Periapical lucency about the residual left mandible molar. No osseous metastatic disease identified. Upper chest: Reported separately today. IMPRESSION: 1. 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 2. Suspect metastatic nodal disease at level 3 on the right (small but asymmetric up to 9 mm nodes). Bilateral level 2 nodes are indeterminate and also measure up to 9 mm individually. No cystic or necrotic nodes in the neck. 3. See CT chest abdomen and pelvis from today reported separately. Electronically Signed   By: Genevie Ann M.D.   On: 08/29/2015 19:56   Ct Chest W Contrast  08/29/2015  CLINICAL DATA:  60 year old female with 2-3 day history of vomiting. Abdominal pain for the past month. Loss of voice. EXAM: CT CHEST, ABDOMEN, AND PELVIS WITH CONTRAST TECHNIQUE: Multidetector CT imaging of the chest, abdomen and pelvis was performed following the standard protocol during bolus administration of intravenous contrast. CONTRAST:  57m OMNIPAQUE IOHEXOL 300 MG/ML  SOLN COMPARISON:  No priors. FINDINGS: CT CHEST FINDINGS Mediastinum/Lymph Nodes: Heart size is normal. There is no significant pericardial fluid, thickening or pericardial calcification. There is atherosclerosis of the thoracic aorta, the great vessels of the  mediastinum and the coronary arteries, including calcified atherosclerotic plaque in the left main, left anterior descending, left circumflex and right coronary arteries. Calcifications of the aortic valve and mitral valve/annulus. No pathologically enlarged mediastinal or hilar lymph nodes. Esophagus is unremarkable in appearance. No axillary lymphadenopathy. Lungs/Pleura: 4 mm subpleural nodule in the lateral segment of the right middle lobe (image 31 of series 4). 4 mm subpleural nodule in the posterior left lower lobe (image 38 of series 4). No other suspicious appearing pulmonary nodules or masses. No acute consolidative airspace disease. No pleural effusions. Musculoskeletal/Soft Tissues: There are no aggressive appearing lytic or blastic lesions noted in the visualized portions of the skeleton. CT ABDOMEN AND PELVIS FINDINGS Hepatobiliary: No cystic or solid hepatic lesions. No intra or extrahepatic biliary ductal dilatation. 7 mm calcified gallstone lying dependently in the gallbladder. No current findings to suggest an acute cholecystitis at this time. Pancreas: No pancreatic mass. No pancreatic ductal dilatation. No pancreatic or peripancreatic fluid or inflammatory changes. Spleen: Unremarkable. Adrenals/Urinary Tract:  In the lower pole of the left kidney there is a 5.9 x 6.1 x 6.3 cm heterogeneously enhancing lesion highly concerning for renal cell carcinoma. The inferior aspect of this lesion comes in very close proximity to the anterior aspect of the left psoas muscle and quadratus lumborum muscle, however, there appears to be an intervening fat plane at this time. The lesion is well separated from the left renal vein. Right kidney and right adrenal gland are normal in appearance. 1.2 x 1.4 cm indeterminate nodule in the lateral limb of the left adrenal gland. No hydroureteronephrosis. Urinary bladder is largely decompressed, but otherwise unremarkable in appearance. Stomach/Bowel: Normal appearance of  the stomach. No pathologic dilatation of small bowel or colon. A few scattered colonic diverticulae are noted, without surrounding inflammatory changes to suggest an acute diverticulitis at this time. Appendix is normal. Vascular/Lymphatic: Atherosclerosis throughout the abdominal and pelvic vasculature, without evidence of aneurysm or dissection. Left renal vein is widely patent and separate from the lower pole mass. No lymphadenopathy noted in the abdomen or pelvis. Reproductive: Uterus and ovaries are unremarkable in appearance. Other: No significant volume of ascites.  No pneumoperitoneum. Musculoskeletal: There are no aggressive appearing lytic or blastic lesions noted in the visualized portions of the skeleton. IMPRESSION: 1. No acute findings in the abdomen or pelvis to account for the patient's symptoms. 2. However, there is a 5.9 x 6.1 x 6.3 cm heterogeneously enhancing mass in the lower pole of the left kidney highly concerning for renal cell carcinoma. At this time, the lesion appears likely to be encapsulated within Gerota's fascia (although it comes very close to the left psoas and quadratus lumborum musculature), does not involve the left renal vein, and does not appear to be associated with lymphadenopathy. Nonemergent Urologic consultation for surgical resection is strongly recommended in the near future. 3. 1.2 x 1.4 cm indeterminate nodule in the left adrenal gland. Attention on followup studies is recommended, as a metastatic lesion is not excluded. 4. 4 mm subpleural nodules in the right middle lobe and left lower lobe. These are highly nonspecific, and favored to represent subpleural lymph nodes, but attention on followup studies is recommended to ensure stability. 5. Cholelithiasis without evidence of acute cholecystitis at this time. 6. Atherosclerosis, including left main and 3 vessel coronary artery disease. Please note that although the presence of coronary artery calcium documents the  presence of coronary artery disease, the severity of this disease and any potential stenosis cannot be assessed on this non-gated CT examination. Assessment for potential risk factor modification, dietary therapy or pharmacologic therapy may be warranted, if clinically indicated. 7. There are calcifications of the aortic valve and mitral valve/annulus. Echocardiographic correlation for evaluation of potential valvular dysfunction may be warranted if clinically indicated. 8. Colonic diverticulosis without evidence of acute diverticulitis at this time. 9. Additional incidental findings, as above. These results were called by telephone at the time of interpretation on 08/29/2015 at 7:34 pm to Dr. Davonna Belling, who verbally acknowledged these results. Electronically Signed   By: Vinnie Langton M.D.   On: 08/29/2015 19:37   Ct Abdomen Pelvis W Contrast  08/29/2015  CLINICAL DATA:  60 year old female with 2-3 day history of vomiting. Abdominal pain for the past month. Loss of voice. EXAM: CT CHEST, ABDOMEN, AND PELVIS WITH CONTRAST TECHNIQUE: Multidetector CT imaging of the chest, abdomen and pelvis was performed following the standard protocol during bolus administration of intravenous contrast. CONTRAST:  58m OMNIPAQUE IOHEXOL 300 MG/ML  SOLN COMPARISON:  No priors. FINDINGS: CT CHEST FINDINGS Mediastinum/Lymph Nodes: Heart size is normal. There is no significant pericardial fluid, thickening or pericardial calcification. There is atherosclerosis of the thoracic aorta, the great vessels of the mediastinum and the coronary arteries, including calcified atherosclerotic plaque in the left main, left anterior descending, left circumflex and right coronary arteries. Calcifications of the aortic valve and mitral valve/annulus. No pathologically enlarged mediastinal or hilar lymph nodes. Esophagus is unremarkable in appearance. No axillary lymphadenopathy. Lungs/Pleura: 4 mm subpleural nodule in the lateral segment of  the right middle lobe (image 31 of series 4). 4 mm subpleural nodule in the posterior left lower lobe (image 38 of series 4). No other suspicious appearing pulmonary nodules or masses. No acute consolidative airspace disease. No pleural effusions. Musculoskeletal/Soft Tissues: There are no aggressive appearing lytic or blastic lesions noted in the visualized portions of the skeleton. CT ABDOMEN AND PELVIS FINDINGS Hepatobiliary: No cystic or solid hepatic lesions. No intra or extrahepatic biliary ductal dilatation. 7 mm calcified gallstone lying dependently in the gallbladder. No current findings to suggest an acute cholecystitis at this time. Pancreas: No pancreatic mass. No pancreatic ductal dilatation. No pancreatic or peripancreatic fluid or inflammatory changes. Spleen: Unremarkable. Adrenals/Urinary Tract: In the lower pole of the left kidney there is a 5.9 x 6.1 x 6.3 cm heterogeneously enhancing lesion highly concerning for renal cell carcinoma. The inferior aspect of this lesion comes in very close proximity to the anterior aspect of the left psoas muscle and quadratus lumborum muscle, however, there appears to be an intervening fat plane at this time. The lesion is well separated from the left renal vein. Right kidney and right adrenal gland are normal in appearance. 1.2 x 1.4 cm indeterminate nodule in the lateral limb of the left adrenal gland. No hydroureteronephrosis. Urinary bladder is largely decompressed, but otherwise unremarkable in appearance. Stomach/Bowel: Normal appearance of the stomach. No pathologic dilatation of small bowel or colon. A few scattered colonic diverticulae are noted, without surrounding inflammatory changes to suggest an acute diverticulitis at this time. Appendix is normal. Vascular/Lymphatic: Atherosclerosis throughout the abdominal and pelvic vasculature, without evidence of aneurysm or dissection. Left renal vein is widely patent and separate from the lower pole mass. No  lymphadenopathy noted in the abdomen or pelvis. Reproductive: Uterus and ovaries are unremarkable in appearance. Other: No significant volume of ascites.  No pneumoperitoneum. Musculoskeletal: There are no aggressive appearing lytic or blastic lesions noted in the visualized portions of the skeleton. IMPRESSION: 1. No acute findings in the abdomen or pelvis to account for the patient's symptoms. 2. However, there is a 5.9 x 6.1 x 6.3 cm heterogeneously enhancing mass in the lower pole of the left kidney highly concerning for renal cell carcinoma. At this time, the lesion appears likely to be encapsulated within Gerota's fascia (although it comes very close to the left psoas and quadratus lumborum musculature), does not involve the left renal vein, and does not appear to be associated with lymphadenopathy. Nonemergent Urologic consultation for surgical resection is strongly recommended in the near future. 3. 1.2 x 1.4 cm indeterminate nodule in the left adrenal gland. Attention on followup studies is recommended, as a metastatic lesion is not excluded. 4. 4 mm subpleural nodules in the right middle lobe and left lower lobe. These are highly nonspecific, and favored to represent subpleural lymph nodes, but attention on followup studies is recommended to ensure stability. 5. Cholelithiasis without evidence of acute cholecystitis at this time. 6. Atherosclerosis, including left main and  3 vessel coronary artery disease. Please note that although the presence of coronary artery calcium documents the presence of coronary artery disease, the severity of this disease and any potential stenosis cannot be assessed on this non-gated CT examination. Assessment for potential risk factor modification, dietary therapy or pharmacologic therapy may be warranted, if clinically indicated. 7. There are calcifications of the aortic valve and mitral valve/annulus. Echocardiographic correlation for evaluation of potential valvular  dysfunction may be warranted if clinically indicated. 8. Colonic diverticulosis without evidence of acute diverticulitis at this time. 9. Additional incidental findings, as above. These results were called by telephone at the time of interpretation on 08/29/2015 at 7:34 pm to Dr. Davonna Belling, who verbally acknowledged these results. Electronically Signed   By: Vinnie Langton M.D.   On: 08/29/2015 19:37   Dg Chest Port 1 View  09/04/2015  CLINICAL DATA:  Central line placement. EXAM: PORTABLE CHEST 1 VIEW COMPARISON:  08/29/2015 FINDINGS: Endotracheal tube terminates 6 cm above the carina. Right internal jugular approach central venous catheter seen with tip at the expected location of superior vena cava. Cardiomediastinal silhouette is enlarged, likely exaggerated by portable technique and rotation. Mediastinal contours appear intact. There is no evidence of focal airspace consolidation, pleural effusion or pneumothorax. Lung volumes are low. Osseous structures are without acute abnormality. Soft tissues are grossly normal. IMPRESSION: Low lung volumes. Endotracheal tube 6 cm above the carina. Advancement with 2 cm may be considered. Right-sided central venous catheter in satisfactory position radiographically. Electronically Signed   By: Fidela Salisbury M.D.   On: 09/04/2015 16:06   Dg Swallowing Func-speech Pathology  09/03/2015  Objective Swallowing Evaluation:   Patient Details Name: BRISTYL MCLEES MRN: [id number] Date of Birth: 06-19-1956 Today's Date: 09/03/2015 Time: SLP Start Time (ACUTE ONLY): 1124-SLP Stop Time (ACUTE ONLY): 1143 SLP Time Calculation (min) (ACUTE ONLY): 19 min Past Medical History: Past Medical History Diagnosis Date . Thyroid disease  . Hypertension  . Neck mass hospitalized 08/29/2015 . Type II diabetes mellitus ([place])  . Left kidney mass  . Anemia, chronic disease  Past Surgical History: Past Surgical History Procedure Laterality Date . No past surgeries   HPI: Pt is 60 y.o.  female with h/o hypertension and NIDDM. Pt presented to ED with voice changes, difficult swallowing and abdominal discomfort that have progressed over the last month. 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 Subjective: pt alert, eager for food/drink Assessment / Plan / Recommendation CHL IP CLINICAL IMPRESSIONS 09/03/2015 Therapy Diagnosis Severe pharyngeal phase dysphagia Clinical Impression Pt has a severe pharyngeal dysphaghia secondary to presence of supraglottic mass, which impedes bolus flow and decreased airway closure. All consistencies enter the airway during the swallow even with tsp-sized boluses. Aspiration is usually sensed, but she is not able to expel aspirates from her trachea. A chin tuck paired with an immediate cough facilitates clearance through the pharynx and clearance of penetrates with all consistencies tested; however, all liquids are then re-penetrated and aspirated after the swallow. Purees do not re-enter the airway, although aspiration risk remains due to residue. Recommend Dys 1 diet and pudding thick liquids with a chin tuck and immediate cough following all bites. Impact on safety and function Moderate aspiration risk;Risk for inadequate nutrition/hydration   CHL IP TREATMENT RECOMMENDATION 09/03/2015 Treatment Recommendations Therapy as outlined in treatment plan below   Prognosis 09/03/2015 Prognosis for Safe Diet Advancement Guarded Barriers to Reach Goals -- Barriers/Prognosis Comment -- CHL IP  DIET RECOMMENDATION 09/03/2015 SLP Diet Recommendations Dysphagia 1 (Puree) solids;Pudding thick liquid Liquid Administration via Spoon Medication Administration Crushed with puree Compensations Slow rate;Small sips/bites;Chin tuck;Hard cough after swallow Postural Changes Remain semi-upright after after feeds/meals (Comment);Seated upright at 90 degrees   CHL IP OTHER RECOMMENDATIONS 09/03/2015 Recommended Consults -- Oral Care  Recommendations Oral care BID Other Recommendations Order thickener from pharmacy;Prohibited food (jello, ice cream, thin soups);Remove water pitcher   CHL IP FOLLOW UP RECOMMENDATIONS 09/03/2015 Follow up Recommendations Outpatient SLP;24 hour supervision/assistance   CHL IP FREQUENCY AND DURATION 09/03/2015 Speech Therapy Frequency (ACUTE ONLY) min 2x/week Treatment Duration 2 weeks      CHL IP ORAL PHASE 09/03/2015 Oral Phase WFL Oral - Pudding Teaspoon -- Oral - Pudding Cup -- Oral - Honey Teaspoon -- Oral - Honey Cup -- Oral - Nectar Teaspoon -- Oral - Nectar Cup -- Oral - Nectar Straw -- Oral - Thin Teaspoon -- Oral - Thin Cup -- Oral - Thin Straw -- Oral - Puree -- Oral - Mech Soft -- Oral - Regular -- Oral - Multi-Consistency -- Oral - Pill -- Oral Phase - Comment --  CHL IP PHARYNGEAL PHASE 09/03/2015 Pharyngeal Phase Impaired Pharyngeal- Pudding Teaspoon -- Pharyngeal -- Pharyngeal- Pudding Cup -- Pharyngeal -- Pharyngeal- Honey Teaspoon Reduced airway/laryngeal closure;Penetration/Aspiration during swallow;Penetration/Apiration after swallow;Pharyngeal residue - pyriform;Pharyngeal residue - posterior pharnyx;Compensatory strategies attempted (with notebox) Pharyngeal Material enters airway, passes BELOW cords and not ejected out despite cough attempt by patient Pharyngeal- Honey Cup -- Pharyngeal -- Pharyngeal- Nectar Teaspoon Reduced airway/laryngeal closure;Penetration/Aspiration during swallow;Penetration/Apiration after swallow;Pharyngeal residue - pyriform;Pharyngeal residue - posterior pharnyx;Compensatory strategies attempted (with notebox) Pharyngeal Material enters airway, passes BELOW cords and not ejected out despite cough attempt by patient Pharyngeal- Nectar Cup -- Pharyngeal -- Pharyngeal- Nectar Straw -- Pharyngeal -- Pharyngeal- Thin Teaspoon Reduced airway/laryngeal closure;Penetration/Aspiration during swallow;Penetration/Apiration after swallow;Pharyngeal residue - pyriform;Pharyngeal  residue - posterior pharnyx;Compensatory strategies attempted (with notebox) Pharyngeal Material enters airway, passes BELOW cords and not ejected out despite cough attempt by patient Pharyngeal- Thin Cup -- Pharyngeal -- Pharyngeal- Thin Straw Reduced airway/laryngeal closure;Penetration/Aspiration during swallow;Penetration/Apiration after swallow;Pharyngeal residue - pyriform;Pharyngeal residue - posterior pharnyx;Compensatory strategies attempted (with notebox) Pharyngeal Material enters airway, passes BELOW cords and not ejected out despite cough attempt by patient Pharyngeal- Puree Reduced airway/laryngeal closure;Penetration/Aspiration during swallow;Compensatory strategies attempted (with notebox);Pharyngeal residue - posterior pharnyx Pharyngeal Material enters airway, remains ABOVE vocal cords and not ejected out Pharyngeal- Mechanical Soft -- Pharyngeal -- Pharyngeal- Regular -- Pharyngeal -- Pharyngeal- Multi-consistency -- Pharyngeal -- Pharyngeal- Pill -- Pharyngeal -- Pharyngeal Comment --  CHL IP CERVICAL ESOPHAGEAL PHASE 09/03/2015 Cervical Esophageal Phase (No Data) Pudding Teaspoon -- Pudding Cup -- Honey Teaspoon -- Honey Cup -- Nectar Teaspoon -- Nectar Cup -- Nectar Straw -- Thin Teaspoon -- Thin Cup -- Thin Straw -- Puree -- Mechanical Soft -- Regular -- Multi-consistency -- Pill -- Cervical Esophageal Comment -- No flowsheet data found. Germain Osgood, M.A. CCC-SLP [phone] Germain Osgood 09/03/2015, 1:33 PM               Labs:  CBC:  Recent Labs  09/05/15 1159 09/05/15 2202 09/06/15 0550 09/06/15 1509 09/07/15 0500 09/07/15 1300  WBC 20.3* 14.9* 12.9*  --  12.2*  --   HGB 10.6* 9.2* 8.2* 9.4* 8.3* 8.7*  HCT 30.5* 27.5* 24.6* 27.4* 24.8* 25.6*  PLT 113* 113* 107*  --  100*  --     COAGS:  Recent Labs  09/04/15 1504  INR 1.34  APTT 26  BMP:  Recent Labs  09/04/15 0325 09/04/15 1504 09/05/15 0350 09/06/15 0549 09/07/15 0500  NA 146* 146* 145 142  136  K 3.7 3.5 3.8 3.3* 4.1  CL 116*  --  115* 108 96*  CO2 23  --  _0 GLUCOSE 216*  --  127* 135* 433*  BUN 17  --  21* 20 13  CALCIUM 7.9*  --  7.1* 7.0* 7.2*  CREATININE 0.89  --  0.96 0.93 0.82  GFRNONAA >60  --  >60 >60 >60  GFRAA >60  --  >60 >60 >60    LIVER FUNCTION TESTS:  Recent Labs  08/29/15 1446 08/31/15 1028 09/06/15 0549  BILITOT 0.4 0.6 0.3  AST 13* 13* 13*  ALT 13* 12* 9*  ALKPHOS 88 70 48  PROT 7.3 6.0* 3.9*  ALBUMIN 2.9* 2.5* 1.5*    TUMOR MARKERS: No results for input(s): AFPTM, CEA, CA199, CHROMGRNA in the last 8760 hours.  Assessment and Plan:  GI bleed with new bloody stool 4 hrs ago Now for mesenteric arteriogram with probable embolization Risks and Benefits discussed with the patient including, but not limited to bleeding, infection, vascular injury or contrast induced renal failure. All of the patient's questions were answered, patient is agreeable to proceed. Consent signed and in chart.  Thank you for this interesting consult.  I greatly enjoyed meeting CARMELIA TINER and look forward to participating in their care.  A copy of this report was sent to the requesting provider on this date.  Electronically Signed: Monia Sabal A 09/07/2015, 2:54 PM   I spent a total of 40 Minutes    in face to face in clinical consultation, greater than 50% of which was counseling/coordinating care for mesenteric arteriogram and poss embolization

## 2015-09-07 NOTE — Sedation Documentation (Signed)
Patient is resting comfortably. 

## 2015-09-07 NOTE — Progress Notes (Signed)
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   No Known Allergies  Patient Measurements: Height: _0  (160 cm) Weight: 250 lb (113.399 kg) IBW/kg (Calculated) : 52.4 Adjusted Body Weight: 67 kg  Usual Weight: 113.4 BMI = 44   Vital Signs: Temp: 98.4 F (36.9 C) (01/25 0740) Temp Source: Oral (01/25 0740) BP: 153/79 mmHg (01/25 0740) Pulse Rate: 82 (01/25 0740) Intake/Output from previous day: 01/24 0701 - 01/25 0700 In: 2722.7 [I.V.:900; IV Piggyback:550; TPN:1272.7] Out: 625 [Urine:625] Intake/Output from this shift:    Labs:  Recent Labs  09/04/15 1504  09/05/15 2202 09/06/15 0550 09/06/15 1509 09/07/15 0500  WBC 18.5*  < > 14.9* 12.9*  --  12.2*  HGB 5.1*  5.6*  < > 9.2* 8.2* 9.4* 8.3*  HCT 15.0*  16.8*  < > 27.5* 24.6* 27.4* 24.8*  PLT 140*  < > 113* 107*  --  100*  APTT 26  --   --   --   --   --   INR 1.34  --   --   --   --   --   < > = values in this interval not displayed.   Recent Labs  09/05/15 0350 09/06/15 0549 09/07/15 0500  NA 145 142 136  K 3.8 3.3* 4.1  CL 115* 108 96*  CO2 _1 GLUCOSE 127* 135* 433*  BUN 21* 20 13  CREATININE 0.96 0.93 0.82  CALCIUM 7.1* 7.0* 7.2*  MG 1.1* 1.6* 1.9  PHOS 2.6 3.0  --   PROT  --  3.9*  --   ALBUMIN  --  1.5*  --   AST  --  13*  --   ALT  --  9*  --   ALKPHOS  --  48  --   BILITOT  --  0.3  --   PREALBUMIN  --  8.7*  --   TRIG  --  97  --    Estimated Creatinine Clearance: 89.6 mL/min (by C-G formula based on Cr of 0.82).    Recent Labs  09/06/15 1211 09/06/15 1815 09/06/15 2243  GLUCAP 108* 108* 143*    Medical History: Past Medical History  Diagnosis Date  . Thyroid disease   . Hypertension   . Neck mass hospitalized 08/29/2015  . Type II diabetes mellitus ([place])   . Left kidney mass   . Anemia, chronic disease     Medications:  Prescriptions prior to admission  Medication Sig Dispense  Refill Last Dose  . ibuprofen (ADVIL,MOTRIN) 200 MG tablet Take 200 mg by mouth daily as needed for moderate pain.   08/28/2015 at Unknown time  . lisinopril-hydrochlorothiazide (PRINZIDE,ZESTORETIC) 10-12.5 MG per tablet Take 1 tablet by mouth daily. 90 tablet 3 1 year  . metFORMIN (GLUCOPHAGE XR) 500 MG 24 hr tablet Take 1 tablet (500 mg total) by mouth daily with breakfast. 30 tablet 3 1 year  . Vitamin D, Ergocalciferol, (DRISDOL) 50000 UNITS CAPS capsule Take 1 capsule (50,000 Units total) by mouth every 7 (seven) days. 12 capsule 0 1 year  . glucose monitoring kit (FREESTYLE) monitoring kit 1 each by Does not apply route 3 (three) times daily before meals. 1 each 0   . nicotine (NICODERM CQ - DOSED IN MG/24 HOURS) 21 mg/24hr patch Place 1 patch (21 mg total) onto the skin daily. (Patient not taking: Reported on 08/29/2015) 28 patch 0 Not Taking  at Unknown time    Insulin Requirements in the past 24 hours:  None   Assessment: 40 YOF who presented with worsening hoarseness of voice and abdominal pain. She had difficulty swallowing foods, N/V, weight loss and no appetite. CT neck found laryngeal mass and CT abdmonen revealed a left renal mass. She was also found to have a UTI with SIRS. Admitted for IV antibiotics. Underwent a biopsy by ENT on 1/20. Hospital course also complicated by development of lower GI bleeding and associated blood loss anemia   Surgeries/Procedures:  GI: S/p upper GI bleed on 09/04/15 requiring multiple units of PRBC. GI/ENT consulted. Underwent tracheostomy placement by ENT on 1/23 to secure airway. She will be nothing by mouth for a while and PEG tube not possible due to likely stomach and duodenal ulcers. On PPI drip. Prealbumin 8.7  Endo: H/o DM, CBGs 108-152, AM glucose was 433 but per RN, AM CBG stick 30 minutes after lab draw was 143. AM labs likely drawn from TPN line. on SSI  Lytes: Na 136, CorrCa 9, Mg 1.9, Phos 2.6>>3, K 4.1 (supplemented x 4 runs) Renal: SCr  0.82, CrCl ~ 85-90 mL/min, LR @ 10 mL/hr  Pulm: Trach collar; 28% FiO2 on 5L  Cards: H/o HTN, BP up slightly to 153/79 Hepatobil: LFTs not elevated  Neuro: Probable head and neck cancer. Per oncology, she is poor candidate for chemotherapy and most likely has renal cancer. Prognosis is extremely. Oncology recommends palliative radiation treatments and palliative care consult. On folic acid and thiamine  Heme: Hgb down to 8.3, Plt 100k ID: WBC down to 12.2, On ceftriaxone for E.coli UTI  Best Practices: PPI, SCDs  TPN Access: PICC line 1/23>> TPN start date: 1/23>>   Current Nutrition:  Clinimix-E 5/15 to 50 mL/hr and 20% IVFE @ 10 mL/hr  Nutritional Goals:  Kcal: 1700-2000 Protein: 105-120 gm Fluid: 1.7-2 L  Plan:  -Increase Clinimix-E 5/15 to 70 mL/hr and 20% IVFE @ 10 mL/hr. This will provide 84 gm of protein (~80% of goal) and 1672 kCal (~98% of goal). Advance to goal as tolerated  -Monitor CBGs as TPN is advanced.  -Continue MVI and TE in TPN bag  -Order TPN labs -Monitor for s/s of refeeding    Albertina Parr, PharmD., BCPS Clinical Pharmacist Pager [phone]

## 2015-09-07 NOTE — Clinical Social Work Note (Signed)
Clinical Social Work Assessment  Patient Details  Name: Ashley Ramsey MRN: [id number] Date of Birth: [birth year]  Date of referral:  09/07/15               Reason for consult:  Facility Placement                Permission sought to share information with:  Family Supports Permission granted to share information::  Yes, Verbal Permission Granted  Name::     Copywriter, advertising::  LOG SNFs  Relationship::  daughter  Contact Information:     Housing/Transportation Living arrangements for the past 2 months:  Single Family Home Source of Information:  Patient, Adult Children Patient Interpreter Needed:  None Criminal Activity/Legal Involvement Pertinent to Current Situation/Hospitalization:  No - Comment as needed Significant Relationships:  Adult Children, Siblings Lives with:  Siblings Do you feel safe going back to the place where you live?  No Need for family participation in patient care:  Yes (Comment)  Care giving concerns:  Pt lives at home with sister, Ashley Ramsey, who is blind with down syndrome- does not have consistent support at home due to other childrens work schedules and other obligations- would not have sufficient help at home given current level of impairment    Facilities manager / plan:  CSW was consulted to speak with pt and family regarding concerns over pts disabled sister Ashley Ramsey as well as potential SNF placement.  CSW informed that pt is POA for sister, Ashley Ramsey, and that pt other sister has consistently tried to gain custody of Ashley Ramsey.  This morning the pts sister came and took Ashley Ramsey from the hospital room without the pts permission- pt does not want Ashley Ramsey in her sisters care but would like for her daughter, Ashley Ramsey, to care for Ashley Ramsey why she is inpatient.    CSW spoke with family concerning next steps- dtr is going to police to report what happened and does think that Ashley Ramsey is in danger with the pt sister- states that sister has unsafe home environment with drugs.    CSW provided pt with note that stated she did not give permission for Ashley Ramsey to be taken by sister Ashley Ramsey and had pt signed- encouraged dtr to provide police with this letter and with copy of POA paperwork.  Also provided dtr with number for APS so she could file a report with them for Ashley Ramsey's safety.  CSW then spoke with pt concerning plan at time of DC- explained PT recommendation for SNF and barriers for pt without insurance.   Employment status:    Insurance information:  Self Pay (Medicaid Pending) PT Recommendations:  [place] / Referral to community resources:  [place]  Patient/Family's Response to care:  Pt agreeable to SNF placement for short term rehab.  Patient/Family's Understanding of and Emotional Response to Diagnosis, Current Treatment, and Prognosis:  Currently no questions or concerns.  Emotional Assessment Appearance:  Appears older than stated age Attitude/Demeanor/Rapport:    Affect (typically observed):  Appropriate, Accepting Orientation:  Oriented to Situation, Oriented to  Time, Oriented to Place, Oriented to Self Alcohol / Substance use:  Not Applicable Psych involvement (Current and /or in the community):  No (Comment)  Discharge Needs  Concerns to be addressed:  Care Coordination Readmission within the last 30 days:  No Current discharge risk:  Physical Impairment, Lack of support system Barriers to Discharge:  Continued Medical Work up   Cranford Mon, LCSW 09/07/2015, 5:24 PM

## 2015-09-07 NOTE — NC FL2 (Signed)
[place] LEVEL OF CARE SCREENING TOOL     IDENTIFICATION  Patient Name: Ashley Ramsey Birthdate: [birth year] Sex: female Admission Date (Current Location): 08/29/2015  [facility] and [place] Number:  Herbalist and Address:  [facility], [place] [address] [place], [place] [address]      Provider Number: [id number]  Attending Physician Name and Address:  Thurnell Lose, MD  Relative Name and Phone Number:       Current Level of Care: SNF Recommended Level of Care: [place] Prior Approval Number:    Date Approved/Denied:   PASRR Number: [id number]  Discharge Plan: SNF    Current Diagnoses: Patient Active Problem List   Diagnosis Date Noted  . Palliative care encounter   . Dysphagia   . Laryngeal cancer ([place])   . GI bleed   . Neck mass 08/29/2015  . Renal mass 08/29/2015  . AKI (acute kidney injury) ([place]) 08/29/2015  . Abnormal TSH 08/21/2013  . Unspecified vitamin D deficiency 08/21/2013  . Essential hypertension, benign 08/21/2013  . Smoking 08/21/2013  . Type 2 diabetes mellitus without complication, without long-term current use of insulin ([place]) 07/17/2013    Orientation RESPIRATION BLADDER Height & Weight    Self, Time, Situation, Place  Tracheostomy Continent 5\' 3"  (160 cm) 250 lbs.  BEHAVIORAL SYMPTOMS/MOOD NEUROLOGICAL BOWEL NUTRITION STATUS      Continent TNA  AMBULATORY STATUS COMMUNICATION OF NEEDS Skin   Extensive Assist Verbally Surgical wounds                       Personal Care Assistance Level of Assistance  Bathing, Dressing Bathing Assistance: Maximum assistance   Dressing Assistance: Maximum assistance     Functional Limitations Info             SPECIAL CARE FACTORS FREQUENCY  PT (By licensed PT), OT (By licensed OT)     PT Frequency: 5/wk OT Frequency: 5/wk            Contractures      Additional Factors Info  Allergies, Insulin Sliding Scale,  Suctioning Needs   Allergies Info: NKA   Insulin Sliding Scale Info: 4/day       Current Medications (09/07/2015):  This is the current hospital active medication list Current Facility-Administered Medications  Medication Dose Route Frequency Provider Last Rate Last Dose  . Marland KitchenTPN (CLINIMIX-E) Adult   Intravenous Continuous TPN Lavenia Atlas, RPH 50 mL/hr at 09/06/15 1734     And  . fat emulsion 20 % infusion 240 mL  240 mL Intravenous Continuous TPN Darnell Level Mancheril, RPH 10 mL/hr at 09/06/15 1734 240 mL at 09/06/15 1734  . Marland KitchenTPN (CLINIMIX-E) Adult   Intravenous Continuous TPN Darnell Level Mancheril, RPH       And  . fat emulsion 20 % infusion 240 mL  240 mL Intravenous Continuous TPN Darnell Level Mancheril, RPH      . 0.9 %  sodium chloride infusion   Intravenous Continuous PRN Sandi Mariscal, MD 10 mL/hr at 09/07/15 1605 10 mL/hr at 09/07/15 1605  . acetaminophen (TYLENOL) suppository 325 mg  325 mg Rectal Q4H PRN Thurnell Lose, MD      . dextromethorphan (DELSYM) 30 MG/5ML liquid 30 mg  30 mg Oral BID PRN Jonetta Osgood, MD      . fentaNYL (SUBLIMAZE) 100 MCG/2ML injection           . fentaNYL (SUBLIMAZE)  injection   Intravenous PRN Sandi Mariscal, MD   25 mcg at 09/07/15 1646  . folic acid injection 1 mg  1 mg Intravenous Daily Marijean Heath, NP   1 mg at 09/07/15 1250  . hydrALAZINE (APRESOLINE) injection 10 mg  10 mg Intravenous Q6H PRN Thurnell Lose, MD   10 mg at 09/05/15 1845  . Influenza vac split quadrivalent PF (FLUARIX) injection 0.5 mL  0.5 mL Intramuscular Tomorrow-1000 [name], MD   0.5 mL at 08/31/15 1000  . insulin aspart (novoLOG) injection 0-9 Units  0-9 Units Subcutaneous 4 times per day Thurnell Lose, MD   2 Units at 09/07/15 1252  . lactated ringers infusion   Intravenous Continuous Lauretta Grill, MD 10 mL/hr at 09/05/15 2145 1,000 mL at 09/05/15 2145  . lidocaine (XYLOCAINE) 1 % (with pres) injection           . lidocaine (XYLOCAINE) 4 %  external solution 0-50 mL  0-50 mL Topical Once PRN Izora Gala, MD      . loratadine (CLARITIN) tablet 10 mg  10 mg Oral Daily Jonetta Osgood, MD   10 mg at 09/03/15 1715  . LORazepam (ATIVAN) injection 0.5 mg  0.5 mg Intravenous Q6H PRN Thurnell Lose, MD   0.5 mg at 09/05/15 2145  . midazolam (VERSED) 2 MG/2ML injection           . midazolam (VERSED) injection   Intravenous PRN Sandi Mariscal, MD   0.5 mg at 09/07/15 1646  . morphine 2 MG/ML injection 2 mg  2 mg Intravenous Q4H PRN Thurnell Lose, MD   2 mg at 09/06/15 2003  . ondansetron (ZOFRAN) injection 4 mg  4 mg Intravenous Q6H PRN [name], MD   4 mg at 09/06/15 2003  . oxymetazoline (AFRIN) 0.05 % nasal spray 1 spray  1 spray Each Nare BID PRN Jonetta Osgood, MD      . pantoprazole (PROTONIX) 80 mg in sodium chloride 0.9 % 250 mL (0.32 mg/mL) infusion  8 mg/hr Intravenous Continuous Thurnell Lose, MD 25 mL/hr at 09/07/15 1600 8 mg/hr at 09/07/15 1600  . RESOURCE THICKENUP CLEAR   Oral PRN Jonetta Osgood, MD      . thiamine (B-1) injection 100 mg  100 mg Intravenous Daily [name], MD   100 mg at 09/07/15 0932     Discharge Medications: Please see discharge summary for a list of discharge medications.  Relevant Imaging Results:  Relevant Lab Results:   Additional Information SS#: [id number]  Cranford Mon, [place]

## 2015-09-07 NOTE — Progress Notes (Signed)
Speech Language Pathology Treatment: Nada Boozer Speaking valve  Patient Details Name: Ashley Ramsey MRN: [id number] DOB: 02/06/1956 Today's Date: 09/07/2015 Time: [id number] SLP Time Calculation (min) (ACUTE ONLY): 20 min  Assessment / Plan / Recommendation Clinical Impression  Pt seen for PMSV treatment and swallow. Initially pt tolerated valve placement for approximately 2 minute intervals decreasing to 30 second intervals due to evidence of back pressure of air pushing valve off trach hub and episode of  increased RR. Insufficient air movement to upper airway, no indication of vocal cord adduction audible with breathy quality. Recommend valve with SLP only; doubt she will be able to use valve with laryngeal mass (smaller and cuffless trach may be beneficial).  Prior MBS performed prior to trach and recommended Dys 1, pudding thick liquids. She will require repeat MBS to currently assess safety for po's. SLP will assess pt tomorrow with MBS without use of Passy-Muir speaking valve.   HPI HPI: Pt is 60 y.o. female with h/o hypertension and NIDDM. Pt presented to ED with voice changes, difficult swallowing and abdominal discomfort that have progressed over the last month. 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      SLP Plan  MBS     Recommendations  Diet recommendations: NPO Medication Administration: Via alternative means (receiving TNA)      Patient may use Passy-Muir Speech Valve: with SLP only PMSV Supervision: Full MD: Please consider changing trach tube to : Smaller size;Cuffless      Oral Care Recommendations: Oral care QID Follow up Recommendations:  (TBD) Plan: MBS                    Biagio Snelson, Orbie Pyo 09/07/2015, 2:29 PM  Orbie Pyo Colvin Caroli.[employer] [phone]

## 2015-09-07 NOTE — Progress Notes (Signed)
Notified Dr Candiss Norse of pt having bloody stool. Ordered H & H now. MD asked me to page GI - DR Collene Mares follows her MD called and notified of above. BP stable

## 2015-09-07 NOTE — Progress Notes (Addendum)
Pt back in room for IR RT groin site clean dry and intact distal pulses 1 + No c/o pain Alert Pt to be on strict bedrest until 2020. If pt needs to void IR says she may use a fracture bedpan.

## 2015-09-07 NOTE — Progress Notes (Signed)
Pt has complicated family issues. Pt's oldest  daughter's boyfriend visiting and brought pt's sister who is blind and mentally challenged & his daughter to room. Pt is normally caregiver for her blind sister and up to 5 children at a time in the home. Stated from  [place] boyfriend  - this is who lives in 1 household. Pt, Ashley Ramsey), her daughter ( also named [place]), Johneshia boyfriend, patients other  daughter ( April), and their 5 children plus Pt's blind sister.  Xariah's ( pt) older sister and nephew came to visit and left with the Blind sister. Rest of family was upset because they said that since pt is the main caregiver of the blind sister the older sister cannot just take her away. The blind sister went with her other sister willfully. Yoeli ( the daughter) boyfriend called the police when he realized they all left with the blind sister. The [facility] came and said that if there was not a restraining order and she left willfully there was nothing they could do.  2 daughters came later and were upset yelling at boyfriend and their mother ( the patient). I asked them not to yell at their mother &  upset their mother because she is sick.  Security made aware of family interactions. Social worker in to talk to family per their request.

## 2015-09-07 NOTE — Sedation Documentation (Signed)
Patient denies pain and is resting comfortably.  

## 2015-09-07 NOTE — Progress Notes (Signed)
Received report form IR RN - pt will be back in room shortly

## 2015-09-07 NOTE — Sedation Documentation (Signed)
Pt transported to 2C02 by Cruzita Lederer and Almyra Free IR tech, Right groin assessed with [facility] RN, area clean, dry and intact, no edema or hematoma noted upon assessment.

## 2015-09-07 NOTE — Procedures (Signed)
Post mesenteric arteriogram and percutaneous coil embolization of the GDA.   No immediate post procedural complications.   EBL: None Keep right leg straight for 4 hrs.    SignedSandi Mariscal [phone] 09/07/2015, 5:25 PM

## 2015-09-07 NOTE — Progress Notes (Addendum)
PATIENT DETAILS Name: Ashley Ramsey Age: 60 y.o. Sex: female Date of Birth: 08/30/1955 Admit Date: 08/29/2015 Admitting Physician [name], MD OV:[id number], [name], MD  Brief narrative:  60 year old female with past medical history of hypertension, type 2 diabetes presented to the ED with worsening hoarseness of voice and abdominal pain. Upon further evaluation with a CT neck she was found to have a laryngeal mask, CT abdomen revealed a left renal mass. She was also found to have UTI with SIRS. She was admitted and started on IV antibiotics, ENT was consulted-patient underwent a biopsy on 1/20. Hospital course has been complicated by development of lower GI bleeding and associated blood loss anemia.   She had an episode of hematemesis on 09/04/2015 after with GI was reconsulted, ENT was called as well, she is post tracheostomy on 09/04/2015.  Subjective:  Patient in bed, currently no chest abdominal pain, no shortness of breath. Feels better. No subjective complaints this morning.  Assessment/Plan:   Metastatic Invasive Sq Cell Laryngeal CA : ENT consulted, underwent laryngoscopy with biopsy on 1/20. Oncology consulted, I had detailed discussions with oncologist Dr. Alvy Bimler on 09/04/2015. Patient's prognosis is extremely poor, poor candidate for chemotherapy, also most likely has renal cancer. She recommends palliative radiation treatments and consultation with palliative care. Discussed with ENT on 09/07/2015.  She was doing fairly well but in the morning of 09/04/2015 did having brisk upper GI bleed requiring multiple units of packed RBC transfusion, GI & ENT were both consulted. She underwent tracheostomy placement by ENT on 09/05/2015 to secure her airway. Trach care and speech following along with ENT Dr Constance Holster.  No PEG tube as she has possible stomach and duodenal ulcers. Now has a PICC line for TNA. Speech to reevaluate For both PMV and swallow eval. His  chest with speech and ENT on 09/07/2015.   Acute blood loss related anemia due to UGI bleed. 5 units of packed RBC transfusion on 09/04/2015, H&H now stable, GI bleed due to likely proximal stomach and duodenal ulcer, status post EGD on 09/04/2015, IV PPI monitor H&H. GI following.     09-07-15 @ 1pm - 1 more dark stool, repeat H&H better than in the am, IV PPI continue, GI informed, IR called, coiled/embolized done 09-07-15, transfuse if HB<7.5.     Left renal mass: Highly suspicious for renal cell carcinoma. Doubt it is related to above. Spoke with Dr. Ree Kida MD on 1/17,he reviewed patient's CT scan in chart - recommends outpatient follow-up with him in the office for consideration of nephrectomy in the future.  However patient will have a laryngeal mass addressed first with palliative radiation treatments, currently too weak for chemotherapy and nephrectomy.   Dysphagia: Secondary to known laryngeal mass. Speech therapy following-completed MBS-recommendations are for dysphagia 1 diet. Await arrival of family-will discuss with patient/family regarding risks of aspiration. She will most likely may require PEG tube in the future once GI Ulcers are better. Will DW Dr Collene Mares GI 09-07-15.  E coli pyelonephritis with SIRS: Sensitivities noted, finished Rocephin.  Afebrile however WBC fluctuating-suspect persistent leukocytosis from inflammation due to malignancy-rather than UTI.  Acute renal failure: Likely prerenal azotemia in a setting of UTI/GI bleeding-along with lisinopril/HCTZ use.  Resolved with hydration. Continue to monitor BMP daily given acute blood loss.  Hypertension: Controlled. Continue to hold lisinopril/HCTZ.   Tobacco & Alcohol abuse: Counseled, completed Ativan per CIWA protocol-no signs of withdrawal  Left adrenal gland nodule: Will need outpatient follow-up, especially given left adrenal mass.  Subpleural nodules in the right middle lobe of right lung and left lower lobe  of left lung: Will need to repeat CT chest in 6 months. Stable on room air, no shortness of breath  Possible CAD: Atherosclerosis seen CT chest. Unfortunately with ongoing lower GI bleeding-unable to use antiplatelets. Echo with LFEV 0000000, grade 1 diastolic dysfunction, no wall motion abnormalities. Given numerous above medical problems-not a candidate for any procedures/further workup at this point.  Morbid obesity: Has lost significant amount of weight over the past few months.  Type 2 diabetes: Controlled with CBGs 110-130s. Continue SSI, hold metformin. Follow CBGs  CBG (last 3)   Recent Labs  09/06/15 1815 09/06/15 2243 09/07/15 0739  GLUCAP 108* 143* 143*     Disposition: Remains inpatient-Home in next few days  Antimicrobial agents  See below  Anti-infectives    Start     Dose/Rate Route Frequency Ordered Stop   08/29/15 2330  cefTRIAXone (ROCEPHIN) 1 g in dextrose 5 % 50 mL IVPB     1 g 100 mL/hr over 30 Minutes Intravenous Every 24 hours 08/29/15 2250        DVT Prophylaxis: SCD's, no pharmacological DVT ppx given GI bleed/anemia  Code Status: Full code   Family Communication Daughter  Procedures:  ECHO- TTE  - Left ventricle: The cavity size was normal. Wall thickness was normal. Systolic function was normal. The estimated ejectionfraction was in the range of 55% to 60%. Wall motion was normal;there were no regional wall motion abnormalities. Dopplerparameters are consistent with abnormal left ventricularrelaxation (grade 1 diastolic dysfunction). Doppler parametersare consistent with high ventricular filling pressure. - Aortic valve: There was very mild stenosis. There was trivialregurgitation. - Mitral valve: Calcified annulus. Mildly thickened leaflets . - Left atrium: The atrium was mildly dilated.  Tracheostomy 09/03/2014 by ENT  PICC 09/05/2015  EGD 09/04/2015 by Dr. Havery Moros. With possible ulcers in the duodenum and proximal stomach.  Blood and clots also noted.  Duodenal Artery Embolization - 09-07-15  CONSULTS:   GI, urology , ent, oncology, palliative care  Time spent 30 minutes-Greater than 50% of this time was spent in counseling, explanation of diagnosis, planning of further management, and coordination of care.  MEDICATIONS: Scheduled Meds: . sodium chloride   Intravenous Once  . cefTRIAXone (ROCEPHIN)  IV  1 g Intravenous Q24H  . folic acid  1 mg Intravenous Daily  . Influenza vac split quadrivalent PF  0.5 mL Intramuscular Tomorrow-1000  . insulin aspart  0-9 Units Subcutaneous 4 times per day  . loratadine  10 mg Oral Daily  . sodium chloride  10-40 mL Intracatheter Q12H  . thiamine  100 mg Intravenous Daily   Continuous Infusions: . Marland KitchenTPN (CLINIMIX-E) Adult 50 mL/hr at 09/06/15 1734   And  . fat emulsion 240 mL (09/06/15 1734)  . lactated ringers 1,000 mL (09/05/15 2145)  . pantoprozole (PROTONIX) infusion 8 mg/hr (09/07/15 0808)   PRN Meds:.acetaminophen, dextromethorphan, hydrALAZINE, lidocaine, LORazepam, morphine injection, ondansetron **OR** ondansetron (ZOFRAN) IV, oxymetazoline, RESOURCE THICKENUP CLEAR   PHYSICAL EXAM: Vital signs in last 24 hours: Filed Vitals:   09/07/15 0300 09/07/15 0401 09/07/15 0740 09/07/15 0902  BP:  135/85 153/79   Pulse: 85 93 82 77  Temp:  98.4 F (36.9 C) 98.4 F (36.9 C)   TempSrc:  Oral Oral   Resp: 11 23 18 19   Height:      Weight:  SpO2: 98% 96% 100% 100%    Weight change:  Filed Weights   08/29/15 1422  Weight: 113.399 kg (250 lb)   Body mass index is 44.3 kg/(m^2).   Gen Exam: Awake and alert with clear speech. Obese.   Neck: Supple-exam limited by body habitus. No stridor, Trach in place Chest:  CVS: S1 S2 regular. Abdomen: soft, non tender Extremities:  Neurologic: Non Focal Psych: cooperative Wounds: N/A.   Intake/Output from previous day:  Intake/Output Summary (Last 24 hours) at 09/07/15 0905 Last data filed at 09/07/15  0700  Gross per 24 hour  Intake 2372.67 ml  Output    875 ml  Net 1497.67 ml     LAB RESULTS: CBC  Recent Labs Lab 09/04/15 0325  09/05/15 0350 09/05/15 1159 09/05/15 2202 09/06/15 0549 09/06/15 0550 09/06/15 1509 09/07/15 0500  WBC 18.1*  < > 19.1* 20.3* 14.9*  --  12.9*  --  12.2*  HGB 6.6*  < > 9.6* 10.6* 9.2*  --  8.2* 9.4* 8.3*  HCT 20.7*  < > 27.8* 30.5* 27.5*  --  24.6* 27.4* 24.8*  PLT 196  < > 103* 113* 113*  --  107*  --  100*  MCV 86.6  < > 83.2 83.3 83.8  --  84.5  --  87.6  MCH 27.6  < > 28.7 29.0 28.0  --  28.2  --  29.3  MCHC 31.9  < > 34.5 34.8 33.5  --  33.3  --  33.5  RDW 15.4  < > 14.9 15.5 15.6*  --  15.9*  --  16.3*  LYMPHSABS 1.3  --   --   --   --  1.8  --   --   --   MONOABS 0.7  --   --   --   --  0.8  --   --   --   EOSABS 0.1  --   --   --   --  0.2  --   --   --   BASOSABS 0.0  --   --   --   --  0.0  --   --   --   < > = values in this interval not displayed.  Chemistries   Recent Labs Lab 08/31/15 1028  09/02/15 0547 09/04/15 0325 09/04/15 1504 09/05/15 0350 09/06/15 0549 09/07/15 0500  NA 144  < > 146* 146* 146* 145 142 136  K 3.9  < > 3.6 3.7 3.5 3.8 3.3* 4.1  CL 113*  < > 112* 116*  --  115* 108 96*  CO2 24  < > 24 23  --  25 27 23   GLUCOSE 174*  < > 101* 216*  --  127* 135* 433*  BUN 36*  < > 22* 17  --  21* 20 13  CREATININE 0.88  < > 0.82 0.89  --  0.96 0.93 0.82  CALCIUM 8.6*  < > 8.4* 7.9*  --  7.1* 7.0* 7.2*  MG 1.8  --   --   --   --  1.1* 1.6* 1.9  < > = values in this interval not displayed.  CBG:  Recent Labs Lab 09/06/15 0611 09/06/15 1211 09/06/15 1815 09/06/15 2243 09/07/15 0739  GLUCAP 114* 108* 108* 143* 143*    GFR Estimated Creatinine Clearance: 89.6 mL/min (by C-G formula based on Cr of 0.82).  Coagulation profile  Recent Labs Lab 09/04/15 1504  INR 1.34    Cardiac  Enzymes No results for input(s): CKMB, TROPONINI, MYOGLOBIN in the last 168 hours.  Invalid input(s): CK  Invalid  input(s): POCBNP No results for input(s): DDIMER in the last 72 hours. No results for input(s): HGBA1C in the last 72 hours.  Recent Labs  09/06/15 0549  TRIG 97   No results for input(s): TSH, T4TOTAL, T3FREE, THYROIDAB in the last 72 hours.  Invalid input(s): FREET3 No results for input(s): VITAMINB12, FOLATE, FERRITIN, TIBC, IRON, RETICCTPCT in the last 72 hours. No results for input(s): LIPASE, AMYLASE in the last 72 hours.  Urine Studies No results for input(s): UHGB, CRYS in the last 72 hours.  Invalid input(s): UACOL, UAPR, USPG, UPH, UTP, UGL, UKET, UBIL, UNIT, UROB, ULEU, UEPI, UWBC, URBC, UBAC, CAST, UCOM, BILUA  MICROBIOLOGY: Recent Results (from the past 240 hour(s))  Culture, Urine     Status: None   Collection Time: 08/29/15  9:09 PM  Result Value Ref Range Status   Specimen Description URINE, CATHETERIZED  Final   Special Requests NONE  Final   Culture >=100,000 COLONIES/mL ESCHERICHIA COLI  Final   Report Status 09/01/2015 FINAL  Final   Organism ID, Bacteria ESCHERICHIA COLI  Final      Susceptibility   Escherichia coli - MIC*    AMPICILLIN >=32 RESISTANT Resistant     CEFAZOLIN <=4 SENSITIVE Sensitive     CEFTRIAXONE <=1 SENSITIVE Sensitive     CIPROFLOXACIN <=0.25 SENSITIVE Sensitive     GENTAMICIN <=1 SENSITIVE Sensitive     IMIPENEM <=0.25 SENSITIVE Sensitive     NITROFURANTOIN <=16 SENSITIVE Sensitive     TRIMETH/SULFA <=20 SENSITIVE Sensitive     AMPICILLIN/SULBACTAM 4 SENSITIVE Sensitive     PIP/TAZO <=4 SENSITIVE Sensitive     * >=100,000 COLONIES/mL ESCHERICHIA COLI  Surgical pcr screen     Status: None   Collection Time: 09/01/15  4:09 AM  Result Value Ref Range Status   MRSA, PCR NEGATIVE NEGATIVE Final   Staphylococcus aureus NEGATIVE NEGATIVE Final    Comment:        The Xpert SA Assay (FDA approved for NASAL specimens in patients over 68 years of age), is one component of a comprehensive surveillance program.  Test performance  has been validated by [facility] for patients greater than or equal to 61 year old. It is not intended to diagnose infection nor to guide or monitor treatment.   MRSA PCR Screening     Status: None   Collection Time: 09/04/15  7:03 PM  Result Value Ref Range Status   MRSA by PCR NEGATIVE NEGATIVE Final    Comment:        The GeneXpert MRSA Assay (FDA approved for NASAL specimens only), is one component of a comprehensive MRSA colonization surveillance program. It is not intended to diagnose MRSA infection nor to guide or monitor treatment for MRSA infections.     RADIOLOGY STUDIES/RESULTS: Dg Chest 2 View  08/29/2015  CLINICAL DATA:  One month history of chest pain and hoarse voice. EXAM: CHEST  2 VIEW COMPARISON:  None. FINDINGS: The heart is borderline and enlarged. Mild tortuosity of the thoracic aorta. Low lung volumes with mild vascular crowding and streaky basilar atelectasis. There also bronchitic changes which could be acute or chronic. No infiltrates or effusions. The bony thorax is intact. IMPRESSION: Acute versus chronic bronchitic change.  No infiltrates or effusions Electronically Signed   By: Marijo Sanes M.D.   On: 08/29/2015 16:15   Ct Soft Tissue Neck W Contrast  08/29/2015  CLINICAL DATA:  60 year old female with abnormal full waist for 1 month. Abdominal pain. Three days of vomiting. Initial encounter. EXAM: CT NECK WITH CONTRAST TECHNIQUE: Multidetector CT imaging of the neck was performed using the standard protocol following the bolus administration of intravenous contrast. CONTRAST:  52mL OMNIPAQUE IOHEXOL 300 MG/ML SOLN in conjunction with contrast enhanced imaging of the chest, abdomen, and pelvis reported separately. COMPARISON:  CT chest abdomen and pelvis from today reported separately FINDINGS: Pharynx and larynx: Bulky supraglottic laryngeal tumor with marked soft tissue enlargement of the bilateral aryepiglottic folds and anterior commissure up to 17 mm  in thickness. Tumor appears inseparable from the undersurface of the strap muscles suggesting extension through the thyroid cartilage bilaterally. Posterior hypopharynx involvement. The epiglottis is thickened, nodular, and distorted. Furthermore, the vallecula is mostly effaced and soft tissue at the base of tongue appears nodular and thickened. All told, tumor encompasses 37 x 40 x 58 mm (AP by transverse by CC). The other laryngeal cartilages appear spared. The palatine tonsils, soft palate, and nasopharynx appear within normal limits. Superior parapharyngeal spaces and retropharyngeal space are within normal limits. Salivary glands: Sublingual space, submandibular glands, and parotid glands are within normal limits. Thyroid: Coarsely calcified 2.3 cm right thyroid nodule. Subcentimeter left lobe nodule. Lymph nodes: Abnormal right level 3 node measuring 9 mm at the level of the thyroid cartilage (series 1, image 57). Small but asymmetric 5 mm right level IIIa lymph node at the level of the hyoid bone on the right (series 1, image 49). Bilateral level 2A nodes appear symmetric measuring 8-9 mm in thickness. No level 4, 5, or level 1 lymphadenopathy. Vascular: Suboptimal intravascular contrast timing, but major vascular structures in the neck and at the skullbase appear to remain patent. Left greater than right carotid bifurcation calcified atherosclerosis. Limited intracranial: Negative.  Partially empty sella. Visualized orbits: Negative. Mastoids and visualized paranasal sinuses: Visualized paranasal sinuses and mastoids are clear. Skeleton: Mostly absent dentition. Periapical lucency about the residual left mandible molar. No osseous metastatic disease identified. Upper chest: Reported separately today. IMPRESSION: 1. 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 2. Suspect metastatic nodal disease at level 3 on the right  (small but asymmetric up to 9 mm nodes). Bilateral level 2 nodes are indeterminate and also measure up to 9 mm individually. No cystic or necrotic nodes in the neck. 3. See CT chest abdomen and pelvis from today reported separately. Electronically Signed   By: Genevie Ann M.D.   On: 08/29/2015 19:56   Ct Chest W Contrast  08/29/2015  CLINICAL DATA:  60 year old female with 2-3 day history of vomiting. Abdominal pain for the past month. Loss of voice. EXAM: CT CHEST, ABDOMEN, AND PELVIS WITH CONTRAST TECHNIQUE: Multidetector CT imaging of the chest, abdomen and pelvis was performed following the standard protocol during bolus administration of intravenous contrast. CONTRAST:  61mL OMNIPAQUE IOHEXOL 300 MG/ML  SOLN COMPARISON:  No priors. FINDINGS: CT CHEST FINDINGS Mediastinum/Lymph Nodes: Heart size is normal. There is no significant pericardial fluid, thickening or pericardial calcification. There is atherosclerosis of the thoracic aorta, the great vessels of the mediastinum and the coronary arteries, including calcified atherosclerotic plaque in the left main, left anterior descending, left circumflex and right coronary arteries. Calcifications of the aortic valve and mitral valve/annulus. No pathologically enlarged mediastinal or hilar lymph nodes. Esophagus is unremarkable in appearance. No axillary lymphadenopathy. Lungs/Pleura: 4 mm subpleural nodule  in the lateral segment of the right middle lobe (image 31 of series 4). 4 mm subpleural nodule in the posterior left lower lobe (image 38 of series 4). No other suspicious appearing pulmonary nodules or masses. No acute consolidative airspace disease. No pleural effusions. Musculoskeletal/Soft Tissues: There are no aggressive appearing lytic or blastic lesions noted in the visualized portions of the skeleton. CT ABDOMEN AND PELVIS FINDINGS Hepatobiliary: No cystic or solid hepatic lesions. No intra or extrahepatic biliary ductal dilatation. 7 mm calcified gallstone  lying dependently in the gallbladder. No current findings to suggest an acute cholecystitis at this time. Pancreas: No pancreatic mass. No pancreatic ductal dilatation. No pancreatic or peripancreatic fluid or inflammatory changes. Spleen: Unremarkable. Adrenals/Urinary Tract: In the lower pole of the left kidney there is a 5.9 x 6.1 x 6.3 cm heterogeneously enhancing lesion highly concerning for renal cell carcinoma. The inferior aspect of this lesion comes in very close proximity to the anterior aspect of the left psoas muscle and quadratus lumborum muscle, however, there appears to be an intervening fat plane at this time. The lesion is well separated from the left renal vein. Right kidney and right adrenal gland are normal in appearance. 1.2 x 1.4 cm indeterminate nodule in the lateral limb of the left adrenal gland. No hydroureteronephrosis. Urinary bladder is largely decompressed, but otherwise unremarkable in appearance. Stomach/Bowel: Normal appearance of the stomach. No pathologic dilatation of small bowel or colon. A few scattered colonic diverticulae are noted, without surrounding inflammatory changes to suggest an acute diverticulitis at this time. Appendix is normal. Vascular/Lymphatic: Atherosclerosis throughout the abdominal and pelvic vasculature, without evidence of aneurysm or dissection. Left renal vein is widely patent and separate from the lower pole mass. No lymphadenopathy noted in the abdomen or pelvis. Reproductive: Uterus and ovaries are unremarkable in appearance. Other: No significant volume of ascites.  No pneumoperitoneum. Musculoskeletal: There are no aggressive appearing lytic or blastic lesions noted in the visualized portions of the skeleton. IMPRESSION: 1. No acute findings in the abdomen or pelvis to account for the patient's symptoms. 2. However, there is a 5.9 x 6.1 x 6.3 cm heterogeneously enhancing mass in the lower pole of the left kidney highly concerning for renal cell  carcinoma. At this time, the lesion appears likely to be encapsulated within Gerota's fascia (although it comes very close to the left psoas and quadratus lumborum musculature), does not involve the left renal vein, and does not appear to be associated with lymphadenopathy. Nonemergent Urologic consultation for surgical resection is strongly recommended in the near future. 3. 1.2 x 1.4 cm indeterminate nodule in the left adrenal gland. Attention on followup studies is recommended, as a metastatic lesion is not excluded. 4. 4 mm subpleural nodules in the right middle lobe and left lower lobe. These are highly nonspecific, and favored to represent subpleural lymph nodes, but attention on followup studies is recommended to ensure stability. 5. Cholelithiasis without evidence of acute cholecystitis at this time. 6. Atherosclerosis, including left main and 3 vessel coronary artery disease. Please note that although the presence of coronary artery calcium documents the presence of coronary artery disease, the severity of this disease and any potential stenosis cannot be assessed on this non-gated CT examination. Assessment for potential risk factor modification, dietary therapy or pharmacologic therapy may be warranted, if clinically indicated. 7. There are calcifications of the aortic valve and mitral valve/annulus. Echocardiographic correlation for evaluation of potential valvular dysfunction may be warranted if clinically indicated. 8. Colonic diverticulosis without  evidence of acute diverticulitis at this time. 9. Additional incidental findings, as above. These results were called by telephone at the time of interpretation on 08/29/2015 at 7:34 pm to Dr. Davonna Belling, who verbally acknowledged these results. Electronically Signed   By: Vinnie Langton M.D.   On: 08/29/2015 19:37   Ct Abdomen Pelvis W Contrast  08/29/2015  CLINICAL DATA:  60 year old female with 2-3 day history of vomiting. Abdominal pain for  the past month. Loss of voice. EXAM: CT CHEST, ABDOMEN, AND PELVIS WITH CONTRAST TECHNIQUE: Multidetector CT imaging of the chest, abdomen and pelvis was performed following the standard protocol during bolus administration of intravenous contrast. CONTRAST:  62mL OMNIPAQUE IOHEXOL 300 MG/ML  SOLN COMPARISON:  No priors. FINDINGS: CT CHEST FINDINGS Mediastinum/Lymph Nodes: Heart size is normal. There is no significant pericardial fluid, thickening or pericardial calcification. There is atherosclerosis of the thoracic aorta, the great vessels of the mediastinum and the coronary arteries, including calcified atherosclerotic plaque in the left main, left anterior descending, left circumflex and right coronary arteries. Calcifications of the aortic valve and mitral valve/annulus. No pathologically enlarged mediastinal or hilar lymph nodes. Esophagus is unremarkable in appearance. No axillary lymphadenopathy. Lungs/Pleura: 4 mm subpleural nodule in the lateral segment of the right middle lobe (image 31 of series 4). 4 mm subpleural nodule in the posterior left lower lobe (image 38 of series 4). No other suspicious appearing pulmonary nodules or masses. No acute consolidative airspace disease. No pleural effusions. Musculoskeletal/Soft Tissues: There are no aggressive appearing lytic or blastic lesions noted in the visualized portions of the skeleton. CT ABDOMEN AND PELVIS FINDINGS Hepatobiliary: No cystic or solid hepatic lesions. No intra or extrahepatic biliary ductal dilatation. 7 mm calcified gallstone lying dependently in the gallbladder. No current findings to suggest an acute cholecystitis at this time. Pancreas: No pancreatic mass. No pancreatic ductal dilatation. No pancreatic or peripancreatic fluid or inflammatory changes. Spleen: Unremarkable. Adrenals/Urinary Tract: In the lower pole of the left kidney there is a 5.9 x 6.1 x 6.3 cm heterogeneously enhancing lesion highly concerning for renal cell carcinoma.  The inferior aspect of this lesion comes in very close proximity to the anterior aspect of the left psoas muscle and quadratus lumborum muscle, however, there appears to be an intervening fat plane at this time. The lesion is well separated from the left renal vein. Right kidney and right adrenal gland are normal in appearance. 1.2 x 1.4 cm indeterminate nodule in the lateral limb of the left adrenal gland. No hydroureteronephrosis. Urinary bladder is largely decompressed, but otherwise unremarkable in appearance. Stomach/Bowel: Normal appearance of the stomach. No pathologic dilatation of small bowel or colon. A few scattered colonic diverticulae are noted, without surrounding inflammatory changes to suggest an acute diverticulitis at this time. Appendix is normal. Vascular/Lymphatic: Atherosclerosis throughout the abdominal and pelvic vasculature, without evidence of aneurysm or dissection. Left renal vein is widely patent and separate from the lower pole mass. No lymphadenopathy noted in the abdomen or pelvis. Reproductive: Uterus and ovaries are unremarkable in appearance. Other: No significant volume of ascites.  No pneumoperitoneum. Musculoskeletal: There are no aggressive appearing lytic or blastic lesions noted in the visualized portions of the skeleton. IMPRESSION: 1. No acute findings in the abdomen or pelvis to account for the patient's symptoms. 2. However, there is a 5.9 x 6.1 x 6.3 cm heterogeneously enhancing mass in the lower pole of the left kidney highly concerning for renal cell carcinoma. At this time, the lesion appears likely to  be encapsulated within Gerota's fascia (although it comes very close to the left psoas and quadratus lumborum musculature), does not involve the left renal vein, and does not appear to be associated with lymphadenopathy. Nonemergent Urologic consultation for surgical resection is strongly recommended in the near future. 3. 1.2 x 1.4 cm indeterminate nodule in the left  adrenal gland. Attention on followup studies is recommended, as a metastatic lesion is not excluded. 4. 4 mm subpleural nodules in the right middle lobe and left lower lobe. These are highly nonspecific, and favored to represent subpleural lymph nodes, but attention on followup studies is recommended to ensure stability. 5. Cholelithiasis without evidence of acute cholecystitis at this time. 6. Atherosclerosis, including left main and 3 vessel coronary artery disease. Please note that although the presence of coronary artery calcium documents the presence of coronary artery disease, the severity of this disease and any potential stenosis cannot be assessed on this non-gated CT examination. Assessment for potential risk factor modification, dietary therapy or pharmacologic therapy may be warranted, if clinically indicated. 7. There are calcifications of the aortic valve and mitral valve/annulus. Echocardiographic correlation for evaluation of potential valvular dysfunction may be warranted if clinically indicated. 8. Colonic diverticulosis without evidence of acute diverticulitis at this time. 9. Additional incidental findings, as above. These results were called by telephone at the time of interpretation on 08/29/2015 at 7:34 pm to Dr. Davonna Belling, who verbally acknowledged these results. Electronically Signed   By: Vinnie Langton M.D.   On: 08/29/2015 19:37   Dg Chest Port 1 View  09/04/2015  CLINICAL DATA:  Central line placement. EXAM: PORTABLE CHEST 1 VIEW COMPARISON:  08/29/2015 FINDINGS: Endotracheal tube terminates 6 cm above the carina. Right internal jugular approach central venous catheter seen with tip at the expected location of superior vena cava. Cardiomediastinal silhouette is enlarged, likely exaggerated by portable technique and rotation. Mediastinal contours appear intact. There is no evidence of focal airspace consolidation, pleural effusion or pneumothorax. Lung volumes are low. Osseous  structures are without acute abnormality. Soft tissues are grossly normal. IMPRESSION: Low lung volumes. Endotracheal tube 6 cm above the carina. Advancement with 2 cm may be considered. Right-sided central venous catheter in satisfactory position radiographically. Electronically Signed   By: Fidela Salisbury M.D.   On: 09/04/2015 16:06   Dg Swallowing Func-speech Pathology  09/03/2015  Objective Swallowing Evaluation:   Patient Details Name: Ashley Ramsey MRN: [id number] Date of Birth: [birth year] Today's Date: 09/03/2015 Time: SLP Start Time (ACUTE ONLY): 1124-SLP Stop Time (ACUTE ONLY): 1143 SLP Time Calculation (min) (ACUTE ONLY): 19 min Past Medical History: Past Medical History Diagnosis Date . Thyroid disease  . Hypertension  . Neck mass hospitalized 08/29/2015 . Type II diabetes mellitus ([place])  . Left kidney mass  . Anemia, chronic disease  Past Surgical History: Past Surgical History Procedure Laterality Date . No past surgeries   HPI: Pt is 60 y.o. female with h/o hypertension and NIDDM. Pt presented to ED with voice changes, difficult swallowing and abdominal discomfort that have progressed over the last month. 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 Subjective: pt alert, eager for food/drink Assessment / Plan / Recommendation CHL IP CLINICAL IMPRESSIONS 09/03/2015 Therapy Diagnosis Severe pharyngeal phase dysphagia Clinical Impression Pt has a severe pharyngeal dysphaghia secondary to presence of supraglottic mass, which impedes bolus flow and decreased airway closure. All consistencies enter the airway during the swallow even with tsp-sized boluses. Aspiration is  usually sensed, but she is not able to expel aspirates from her trachea. A chin tuck paired with an immediate cough facilitates clearance through the pharynx and clearance of penetrates with all consistencies tested; however, all liquids are then re-penetrated and aspirated after the  swallow. Purees do not re-enter the airway, although aspiration risk remains due to residue. Recommend Dys 1 diet and pudding thick liquids with a chin tuck and immediate cough following all bites. Impact on safety and function Moderate aspiration risk;Risk for inadequate nutrition/hydration   CHL IP TREATMENT RECOMMENDATION 09/03/2015 Treatment Recommendations Therapy as outlined in treatment plan below   Prognosis 09/03/2015 Prognosis for Safe Diet Advancement Guarded Barriers to Reach Goals -- Barriers/Prognosis Comment -- CHL IP DIET RECOMMENDATION 09/03/2015 SLP Diet Recommendations Dysphagia 1 (Puree) solids;Pudding thick liquid Liquid Administration via Spoon Medication Administration Crushed with puree Compensations Slow rate;Small sips/bites;Chin tuck;Hard cough after swallow Postural Changes Remain semi-upright after after feeds/meals (Comment);Seated upright at 90 degrees   CHL IP OTHER RECOMMENDATIONS 09/03/2015 Recommended Consults -- Oral Care Recommendations Oral care BID Other Recommendations Order thickener from pharmacy;Prohibited food (jello, ice cream, thin soups);Remove water pitcher   CHL IP FOLLOW UP RECOMMENDATIONS 09/03/2015 Follow up Recommendations Outpatient SLP;24 hour supervision/assistance   CHL IP FREQUENCY AND DURATION 09/03/2015 Speech Therapy Frequency (ACUTE ONLY) min 2x/week Treatment Duration 2 weeks      CHL IP ORAL PHASE 09/03/2015 Oral Phase WFL Oral - Pudding Teaspoon -- Oral - Pudding Cup -- Oral - Honey Teaspoon -- Oral - Honey Cup -- Oral - Nectar Teaspoon -- Oral - Nectar Cup -- Oral - Nectar Straw -- Oral - Thin Teaspoon -- Oral - Thin Cup -- Oral - Thin Straw -- Oral - Puree -- Oral - Mech Soft -- Oral - Regular -- Oral - Multi-Consistency -- Oral - Pill -- Oral Phase - Comment --  CHL IP PHARYNGEAL PHASE 09/03/2015 Pharyngeal Phase Impaired Pharyngeal- Pudding Teaspoon -- Pharyngeal -- Pharyngeal- Pudding Cup -- Pharyngeal -- Pharyngeal- Honey Teaspoon Reduced  airway/laryngeal closure;Penetration/Aspiration during swallow;Penetration/Apiration after swallow;Pharyngeal residue - pyriform;Pharyngeal residue - posterior pharnyx;Compensatory strategies attempted (with notebox) Pharyngeal Material enters airway, passes BELOW cords and not ejected out despite cough attempt by patient Pharyngeal- Honey Cup -- Pharyngeal -- Pharyngeal- Nectar Teaspoon Reduced airway/laryngeal closure;Penetration/Aspiration during swallow;Penetration/Apiration after swallow;Pharyngeal residue - pyriform;Pharyngeal residue - posterior pharnyx;Compensatory strategies attempted (with notebox) Pharyngeal Material enters airway, passes BELOW cords and not ejected out despite cough attempt by patient Pharyngeal- Nectar Cup -- Pharyngeal -- Pharyngeal- Nectar Straw -- Pharyngeal -- Pharyngeal- Thin Teaspoon Reduced airway/laryngeal closure;Penetration/Aspiration during swallow;Penetration/Apiration after swallow;Pharyngeal residue - pyriform;Pharyngeal residue - posterior pharnyx;Compensatory strategies attempted (with notebox) Pharyngeal Material enters airway, passes BELOW cords and not ejected out despite cough attempt by patient Pharyngeal- Thin Cup -- Pharyngeal -- Pharyngeal- Thin Straw Reduced airway/laryngeal closure;Penetration/Aspiration during swallow;Penetration/Apiration after swallow;Pharyngeal residue - pyriform;Pharyngeal residue - posterior pharnyx;Compensatory strategies attempted (with notebox) Pharyngeal Material enters airway, passes BELOW cords and not ejected out despite cough attempt by patient Pharyngeal- Puree Reduced airway/laryngeal closure;Penetration/Aspiration during swallow;Compensatory strategies attempted (with notebox);Pharyngeal residue - posterior pharnyx Pharyngeal Material enters airway, remains ABOVE vocal cords and not ejected out Pharyngeal- Mechanical Soft -- Pharyngeal -- Pharyngeal- Regular -- Pharyngeal -- Pharyngeal- Multi-consistency -- Pharyngeal --  Pharyngeal- Pill -- Pharyngeal -- Pharyngeal Comment --  CHL IP CERVICAL ESOPHAGEAL PHASE 09/03/2015 Cervical Esophageal Phase (No Data) Pudding Teaspoon -- Pudding Cup -- Honey Teaspoon -- Honey Cup -- Nectar Teaspoon -- Nectar Cup -- Nectar Straw -- Thin Teaspoon --  Thin Cup -- Thin Straw -- Puree -- Mechanical Soft -- Regular -- Multi-consistency -- Pill -- Cervical Esophageal Comment -- No flowsheet data found. Germain Osgood, M.A. CCC-SLP [phone] Germain Osgood 09/03/2015, 1:33 PM               Signature  Thurnell Lose M.D on 09/07/2015 at 9:05 AM  Between 7am to 7pm - Pager - [phone], After 7pm go to [email or web address] - password [email or web address]  [phone]   LOS: 8 days

## 2015-09-08 ENCOUNTER — Inpatient Hospital Stay (HOSPITAL_COMMUNITY): Payer: Medicaid Other

## 2015-09-08 LAB — MAGNESIUM: Magnesium: 1.6 mg/dL — ABNORMAL LOW (ref 1.7–2.4)

## 2015-09-08 LAB — CBC
HCT: 25.6 % — ABNORMAL LOW (ref 36.0–46.0)
HEMOGLOBIN: 8.3 g/dL — AB (ref 12.0–15.0)
MCH: 28.1 pg (ref 26.0–34.0)
MCHC: 32.4 g/dL (ref 30.0–36.0)
MCV: 86.8 fL (ref 78.0–100.0)
PLATELETS: 114 10*3/uL — AB (ref 150–400)
RBC: 2.95 MIL/uL — AB (ref 3.87–5.11)
RDW: 15.3 % (ref 11.5–15.5)
WBC: 13.3 10*3/uL — AB (ref 4.0–10.5)

## 2015-09-08 LAB — COMPREHENSIVE METABOLIC PANEL
ALBUMIN: 1.7 g/dL — AB (ref 3.5–5.0)
ALK PHOS: 61 U/L (ref 38–126)
ALT: 12 U/L — ABNORMAL LOW (ref 14–54)
AST: 15 U/L (ref 15–41)
Anion gap: 7 (ref 5–15)
BILIRUBIN TOTAL: 0.2 mg/dL — AB (ref 0.3–1.2)
BUN: 9 mg/dL (ref 6–20)
CO2: 28 mmol/L (ref 22–32)
Calcium: 7.7 mg/dL — ABNORMAL LOW (ref 8.9–10.3)
Chloride: 104 mmol/L (ref 101–111)
Creatinine, Ser: 0.71 mg/dL (ref 0.44–1.00)
GFR calc Af Amer: >60 mL/min (ref >60)
GFR calc non Af Amer: >60 mL/min (ref >60)
GLUCOSE: 147 mg/dL — AB (ref 65–99)
POTASSIUM: 3.5 mmol/L (ref 3.5–5.1)
SODIUM: 139 mmol/L (ref 135–145)
TOTAL PROTEIN: 4.8 g/dL — AB (ref 6.5–8.1)

## 2015-09-08 LAB — PHOSPHORUS: Phosphorus: 3.4 mg/dL (ref 2.5–4.6)

## 2015-09-08 LAB — GLUCOSE, CAPILLARY
GLUCOSE-CAPILLARY: 142 mg/dL — AB (ref 65–99)
Glucose-Capillary: 136 mg/dL — ABNORMAL HIGH (ref 65–99)
Glucose-Capillary: 138 mg/dL — ABNORMAL HIGH (ref 65–99)
Glucose-Capillary: 160 mg/dL — ABNORMAL HIGH (ref 65–99)

## 2015-09-08 LAB — HEMOGLOBIN AND HEMATOCRIT, BLOOD
HEMATOCRIT: 26.6 % — AB (ref 36.0–46.0)
HEMOGLOBIN: 8.6 g/dL — AB (ref 12.0–15.0)

## 2015-09-08 MED ORDER — POTASSIUM CHLORIDE 10 MEQ/100ML IV SOLN
10.0000 meq | INTRAVENOUS | Status: AC
Start: 2015-09-08 — End: 2015-09-08

## 2015-09-08 MED ORDER — FUROSEMIDE 10 MG/ML IJ SOLN
10.0000 mg | Freq: Once | INTRAMUSCULAR | Status: AC
  Administered 2015-09-08: 10 mg via INTRAVENOUS
  Filled 2015-09-08: qty 2

## 2015-09-08 MED ORDER — POTASSIUM CHLORIDE 10 MEQ/50ML IV SOLN
10.0000 meq | INTRAVENOUS | Status: AC
  Administered 2015-09-08 (×4): 10 meq via INTRAVENOUS
  Filled 2015-09-08 (×6): qty 50

## 2015-09-08 MED ORDER — MAGNESIUM SULFATE 2 GM/50ML IV SOLN
2.0000 g | Freq: Once | INTRAVENOUS | Status: AC
  Administered 2015-09-08: 2 g via INTRAVENOUS
  Filled 2015-09-08: qty 50

## 2015-09-08 MED ORDER — TRACE MINERALS CR-CU-MN-SE-ZN 10-1000-500-60 MCG/ML IV SOLN
INTRAVENOUS | Status: AC
  Administered 2015-09-08: 18:00:00 via INTRAVENOUS
  Filled 2015-09-08: qty 1992

## 2015-09-08 MED ORDER — FAT EMULSION 20 % IV EMUL
240.0000 mL | INTRAVENOUS | Status: AC
  Administered 2015-09-08: 240 mL via INTRAVENOUS
  Filled 2015-09-08: qty 250

## 2015-09-08 NOTE — Progress Notes (Signed)
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   No Known Allergies  Patient Measurements: Height: 5\' 3"  (160 cm) Weight: 250 lb (113.399 kg) IBW/kg (Calculated) : 52.4 Adjusted Body Weight: 67 kg  Usual Weight: 113.4 BMI = 44   Vital Signs: Temp: 98.5 F (36.9 C) (01/26 0817) Temp Source: Oral (01/26 0817) BP: 128/93 mmHg (01/26 0817) Pulse Rate: 88 (01/26 0012) Intake/Output from previous day: 01/25 0701 - 01/26 0700 In: 1810 [I.V.:310; TPN:1500] Out: 1225 [Urine:1225] Intake/Output from this shift: Total I/O In: -  Out: 550 [Urine:550]  Labs:  Recent Labs  09/06/15 0550  09/07/15 0500  09/07/15 1915 09/08/15 0040 09/08/15 0655  WBC 12.9*  --  12.2*  --   --   --  13.3*  HGB 8.2*  < > 8.3*  < > 8.4* 8.6* 8.3*  HCT 24.6*  < > 24.8*  < > 25.8* 26.6* 25.6*  PLT 107*  --  100*  --   --   --  114*  < > = values in this interval not displayed.   Recent Labs  09/06/15 0549 09/07/15 0500 09/08/15 0500  NA 142 136 139  K 3.3* 4.1 3.5  CL 108 96* 104  CO2 27 23 28   GLUCOSE 135* 433* 147*  BUN 20 13 9   CREATININE 0.93 0.82 0.71  CALCIUM 7.0* 7.2* 7.7*  MG 1.6* 1.9 1.6*  PHOS 3.0  --  3.4  PROT 3.9*  --  4.8*  ALBUMIN 1.5*  --  1.7*  AST 13*  --  15  ALT 9*  --  12*  ALKPHOS 48  --  61  BILITOT 0.3  --  0.2*  PREALBUMIN 8.7*  --   --   TRIG 97  --   --    Estimated Creatinine Clearance: 91.8 mL/min (by C-G formula based on Cr of 0.71).    Recent Labs  09/07/15 1828 09/08/15 0011 09/08/15 0603  GLUCAP 120* 160* 138*    Insulin Requirements in the past 24 hours:  3 units SSI   Assessment: 43 YOF who presented with worsening hoarseness of voice and abdominal pain. She had difficulty swallowing foods, N/V, weight loss and no appetite. CT neck found laryngeal mass and CT abdmonen revealed a left renal mass. She was also found to have a UTI with SIRS. Admitted for IV  antibiotics. Underwent a biopsy by ENT on 1/20. Hospital course also complicated by development of lower GI bleeding and associated blood loss anemia   Surgeries/Procedures:  GI: S/p upper GI bleed on 09/04/15 requiring multiple units of PRBC. GI/ENT consulted. Tracheostomy placement by ENT on 1/23. She will be nothing by mouth for a while and PEG tube not possible due to likely stomach and duodenal ulcers. On PPI drip. Prealbumin 8.7 09/07/15: gastroduodenal artery was coiled/embolized by IR Endo: H/o DM, CBGs  120, 160 and 147. On SSI  Lytes: K 3.5, CorrCa 9.5, Mg 1.6, Phos 3.4, MD has ordered 2 gm Mag and lasix 20 IV x 1  Renal: SCr 0.7,  LR @ 10 mL/hr  UOP 0.5 ml/kg/hr  Pulm: Trach collar; 28% FiO2 on 5L  Cards: H/o HTN,  Hepatobil: LFTs not elevated  Neuro: Probable head and neck cancer. Per oncology, she is poor candidate for chemotherapy and most likely has renal cancer. Prognosis is extremely. Oncology recommends palliative radiation treatments and palliative care consult. On folic acid and thiamine  Heme: Hgb down to 8.3, Plt 114k ID: WBC 13.3 ceftriaxone for E.coli UTI 1/17>>1/24 Best Practices: PPI, SCDs  TPN Access: PICC line 1/23>> TPN start date: 1/23>>   Current Nutrition:  Clinimix-E 5/15 70 mL/hr and 20% IVFE @ 10 mL/hr  Nutritional Goals:  Kcal: 1700-2000 Protein: 105-120 gm Fluid: 1.7-2 L  Plan:  -Increase Clinimix-E 5/15 to  54mL/hr and 20% IVFE @ 10 mL/hr. This will provide ~ 100 gm of protein (~ 95% of goal) and 1894  kCal (100% of goal).  -Monitor CBGs as TPN is advanced. No insulin in TPN -Continue MVI and TE in TPN bag  - 2 gm mag (ordered by [facility]) followed by 5  runs of K - DC IV folic acid and thiamine boluses and add to TPN daily -Monitor for s/s of refeeding   Eudelia Bunch, Pharm.D. BP:[phone] 09/08/2015 8:57 AM

## 2015-09-08 NOTE — Progress Notes (Signed)
Pt. Was seen for trach consult, all equipment at bedside, sat were good, 100%. No education at this time. Will continue to follow till DC.

## 2015-09-08 NOTE — Progress Notes (Signed)
MD was asked about patient having 9 total runs of potassium ordered and whether he wanted all 9 to be given. MD stated to give total of 4 runs of potassium only, not 9. Will put not given on the rest of the runs ordered.

## 2015-09-08 NOTE — Progress Notes (Signed)
Patient ID: Ashley Ramsey, female   DOB: 12/04/1955, 60 y.o.   MRN: [id number]  No complaints.  Tracheostomy was changed to a #6 uncuffed. She tolerated this well and is breathing without difficulty. She still has a lot of secretions.  She may try Passy-Muir valve now. I spoke with Dr. Macky Lower at [facility]. They can perform a laryngopharyngectomy with flap reconstruction. They would like to evaluate her if she is interested when she is recovered from her other medical problems, as an outpatient. I will set this up if appropriate. When talking to the patient, she is not very interested in surgery and would rather try radiation first. We discussed that the surgery has a better control rate however the surgery can be used for salvage if the radiation fails.  For now, she will need tracheostomy care and nutritional support. We will discuss her case at tumor Board next week. I will sign off for now. Contact me if you need additional information.

## 2015-09-08 NOTE — Progress Notes (Signed)
Report given to [place] on 5W. Patient will be transferred via bed to 5W12. Belongings sent with patient include cell phone, clothing. CCMD notified of move. Informed of family difficulties and involvement of security/ [employer].  Milford Cage, RN

## 2015-09-08 NOTE — Progress Notes (Signed)
Speech Pathology     MBSS complete. Full report located under chart review in imaging section.     Rudell Marlowe Willis Kacey Dysert M.Ed CCC-SLP Pager [phone]   

## 2015-09-08 NOTE — Progress Notes (Signed)
PATIENT DETAILS Name: Ashley Ramsey Age: 60 y.o. Sex: female Date of Birth: 1955-08-20 Admit Date: 08/29/2015 Admitting Physician [name], MD [id number], [name], MD  Brief narrative:  60 year old female with past medical history of hypertension, type 2 diabetes presented to the ED with worsening hoarseness of voice and abdominal pain. Upon further evaluation with a CT neck she was found to have a laryngeal mask, CT abdomen revealed a left renal mass. She was also found to have UTI with SIRS. She was admitted and started on IV antibiotics, ENT was consulted-patient underwent a biopsy on 1/20. Hospital course has been complicated by development of lower GI bleeding and associated blood loss anemia.   She had an episode of hematemesis on 09/04/2015 after with GI was reconsulted, ENT was called as well, she is post tracheostomy on 09/04/2015.  Subjective:  Patient in bed, currently no chest abdominal pain, no shortness of breath. Feels better. No subjective complaints this morning.  Assessment/Plan:   Metastatic Invasive Sq Cell Laryngeal CA : ENT & oncology consulted, underwent laryngoscopy with biopsy on 1/20 confirming the malignancy.   She underwent tracheostomy placement by ENT on 09/05/2015 to secure her airway. Trach care and speech following along with ENT Dr Constance Holster.  Oncology consulted, I had detailed discussions with oncologist Dr. Alvy Bimler on 09/04/2015. Patient's prognosis is extremely poor, poor candidate for chemotherapy, also most likely has renal cancer. She recommends palliative radiation treatments and consultation with palliative care. Discussed with ENT Dr Constance Holster on 09/07/2015.  No PEG tube as she has possible stomach and duodenal ulcers. Now has a PICC line for TNA. Speech to reevaluate For both PMV and swallow eval. Continue with speech and ENT on 09/07/2015.   Acute blood loss related anemia due to UGI bleed. 5 units of packed RBC  transfusion on 09/04/2015, H&H now stable, GI bleed due to likely proximal stomach and duodenal ulcer, status post EGD on 09/04/2015, on 09-07-15 @ 1pm - 1 more dark stool, continue IV PPI continue, GI informed, IR called, her Gastroduodenal Artery was coiled/embolized by IR on 09-07-15, H&H stable,transfuse if HB<7.5.   Left renal mass: Highly suspicious for renal cell carcinoma. Doubt it is related to above. Spoke with Dr. Ree Kida MD on 1/17,he reviewed patient's CT scan in chart - recommends outpatient follow-up with him in the office for consideration of nephrectomy in the future.  However patient will have a laryngeal mass addressed first with palliative radiation treatments, currently too weak for chemotherapy and nephrectomy.    Dysphagia: Secondary to known laryngeal mass. Speech therapy following-completed MBS-recommendations are for dysphagia 1 diet. Await arrival of family-will discuss with patient/family regarding risks of aspiration. She will most likely may require PEG tube in the future once GI Ulcers are better. DW Dr Collene Mares GI 09-07-15.   E coli pyelonephritis with SIRS: Sensitivities noted, finished Rocephin.  Afebrile.  Acute renal failure: Likely prerenal azotemia, resolved after hydration.  Hypertension: Controlled on PRN Hydralazine.   Tobacco & Alcohol abuse: Counseled, completed Ativan per CIWA protocol-no signs of withdrawal  Left adrenal gland nodule: Will need outpatient follow-up, especially given left adrenal mass.  Subpleural nodules in the right middle lobe of right lung and left lower lobe of left lung: Will need to repeat CT chest in 6 months. Stable on room air, no shortness of breath  Possible CAD: Atherosclerosis seen CT chest. Unfortunately with ongoing lower GI bleeding-unable to use  antiplatelets. Echo with LFEV 77-82%, grade 1 diastolic dysfunction, no wall motion abnormalities. Given numerous above medical problems-not a candidate for any  procedures/further workup at this point.  Morbid obesity: Has lost significant amount of weight over the past few months.  Type 2 diabetes: Controlled with CBGs 110-130s. Continue SSI.  CBG (last 3)   Recent Labs  09/07/15 1828 09/08/15 0011 09/08/15 0603  GLUCAP 120* 160* 138*     Disposition: Remains inpatient-Home in next few days  Antimicrobial agents  See below  Anti-infectives    Start     Dose/Rate Route Frequency Ordered Stop   08/29/15 2330  cefTRIAXone (ROCEPHIN) 1 g in dextrose 5 % 50 mL IVPB  Status:  Discontinued     1 g 100 mL/hr over 30 Minutes Intravenous Every 24 hours 08/29/15 2250 09/07/15 1332      DVT Prophylaxis: SCD's, no pharmacological DVT ppx given GI bleed/anemia  Code Status: Full code   Family Communication Daughter  Procedures:  ECHO- TTE  - Left ventricle: The cavity size was normal. Wall thickness was normal. Systolic function was normal. The estimated ejectionfraction was in the range of 55% to 60%. Wall motion was normal;there were no regional wall motion abnormalities. Dopplerparameters are consistent with abnormal left ventricularrelaxation (grade 1 diastolic dysfunction). Doppler parametersare consistent with high ventricular filling pressure. - Aortic valve: There was very mild stenosis. There was trivialregurgitation. - Mitral valve: Calcified annulus. Mildly thickened leaflets . - Left atrium: The atrium was mildly dilated.  Tracheostomy 09/03/2014 by ENT  PICC 09/05/2015  EGD 09/04/2015 by Dr. Havery Moros. With possible ulcers in the duodenum and proximal stomach. Blood and clots also noted.  Duodenal Artery Embolization - 09-07-15  CONSULTS:   GI, urology , ent, oncology, palliative care  Time spent 30 minutes-Greater than 50% of this time was spent in counseling, explanation of diagnosis, planning of further management, and coordination of care.  MEDICATIONS: Scheduled Meds: . folic acid  1 mg  Intravenous Daily  . Influenza vac split quadrivalent PF  0.5 mL Intramuscular Tomorrow-1000  . insulin aspart  0-9 Units Subcutaneous 4 times per day  . loratadine  10 mg Oral Daily  . magnesium sulfate 1 - 4 g bolus IVPB  2 g Intravenous Once  . thiamine  100 mg Intravenous Daily   Continuous Infusions: . Marland KitchenTPN (CLINIMIX-E) Adult 70 mL/hr at 09/07/15 1818   And  . fat emulsion 240 mL (09/07/15 1818)  . lactated ringers 1,000 mL (09/05/15 2145)  . pantoprozole (PROTONIX) infusion 8 mg/hr (09/07/15 1900)   PRN Meds:.acetaminophen, dextromethorphan, hydrALAZINE, lidocaine, LORazepam, morphine injection, [DISCONTINUED] ondansetron **OR** ondansetron (ZOFRAN) IV, oxymetazoline, RESOURCE THICKENUP CLEAR   PHYSICAL EXAM: Vital signs in last 24 hours: Filed Vitals:   09/07/15 2000 09/08/15 0012 09/08/15 0428 09/08/15 0817  BP: 168/83 151/79 131/58 128/93  Pulse: 89 88    Temp: 98.8 F (37.1 C) 98 F (36.7 C) 97.6 F (36.4 C) 98.5 F (36.9 C)  TempSrc: Oral Oral Oral Oral  Resp: '25 20 19 19  '$ Height:      Weight:      SpO2: 100% 97%  100%    Weight change:  Filed Weights   08/29/15 1422  Weight: 113.399 kg (250 lb)   Body mass index is 44.3 kg/(m^2).   Gen Exam: Awake and alert with clear speech. Obese.   Neck: Supple-exam limited by body habitus. No stridor, Trach in place Chest:  CVS: S1 S2 regular. Abdomen: soft, non tender Extremities:  Neurologic: Non Focal Psych: cooperative Wounds: N/A.   Intake/Output from previous day:  Intake/Output Summary (Last 24 hours) at 09/08/15 0843 Last data filed at 09/08/15 0817  Gross per 24 hour  Intake   1750 ml  Output   1775 ml  Net    -25 ml     LAB RESULTS: CBC  Recent Labs Lab 09/04/15 0325  09/05/15 1159 09/05/15 2202 09/06/15 0549 09/06/15 0550  09/07/15 0500 09/07/15 1300 09/07/15 1915 09/08/15 0040 09/08/15 0655  WBC 18.1*  < > 20.3* 14.9*  --  12.9*  --  12.2*  --   --   --  13.3*  HGB 6.6*  < >  10.6* 9.2*  --  8.2*  < > 8.3* 8.7* 8.4* 8.6* 8.3*  HCT 20.7*  < > 30.5* 27.5*  --  24.6*  < > 24.8* 25.6* 25.8* 26.6* 25.6*  PLT 196  < > 113* 113*  --  107*  --  100*  --   --   --  114*  MCV 86.6  < > 83.3 83.8  --  84.5  --  87.6  --   --   --  86.8  MCH 27.6  < > 29.0 28.0  --  28.2  --  29.3  --   --   --  28.1  MCHC 31.9  < > 34.8 33.5  --  33.3  --  33.5  --   --   --  32.4  RDW 15.4  < > 15.5 15.6*  --  15.9*  --  16.3*  --   --   --  15.3  LYMPHSABS 1.3  --   --   --  1.8  --   --   --   --   --   --   --   MONOABS 0.7  --   --   --  0.8  --   --   --   --   --   --   --   EOSABS 0.1  --   --   --  0.2  --   --   --   --   --   --   --   BASOSABS 0.0  --   --   --  0.0  --   --   --   --   --   --   --   < > = values in this interval not displayed.  Chemistries   Recent Labs Lab 09/04/15 0325 09/04/15 1504 09/05/15 0350 09/06/15 0549 09/07/15 0500 09/08/15 0500  NA 146* 146* 145 142 136 139  K 3.7 3.5 3.8 3.3* 4.1 3.5  CL 116*  --  115* 108 96* 104  CO2 23  --  '25 27 23 28  '$ GLUCOSE 216*  --  127* 135* 433* 147*  BUN 17  --  21* '20 13 9  '$ CREATININE 0.89  --  0.96 0.93 0.82 0.71  CALCIUM 7.9*  --  7.1* 7.0* 7.2* 7.7*  MG  --   --  1.1* 1.6* 1.9 1.6*    CBG:  Recent Labs Lab 09/07/15 0739 09/07/15 1210 09/07/15 1828 09/08/15 0011 09/08/15 0603  GLUCAP 143* 152* 120* 160* 138*    GFR Estimated Creatinine Clearance: 91.8 mL/min (by C-G formula based on Cr of 0.71).  Coagulation profile  Recent Labs Lab 09/04/15 1504  INR 1.34    Cardiac Enzymes No results for input(s): CKMB, TROPONINI,  MYOGLOBIN in the last 168 hours.  Invalid input(s): CK  Invalid input(s): POCBNP No results for input(s): DDIMER in the last 72 hours. No results for input(s): HGBA1C in the last 72 hours.  Recent Labs  09/06/15 0549  TRIG 97   No results for input(s): TSH, T4TOTAL, T3FREE, THYROIDAB in the last 72 hours.  Invalid input(s): FREET3 No results for input(s):  VITAMINB12, FOLATE, FERRITIN, TIBC, IRON, RETICCTPCT in the last 72 hours. No results for input(s): LIPASE, AMYLASE in the last 72 hours.  Urine Studies No results for input(s): UHGB, CRYS in the last 72 hours.  Invalid input(s): UACOL, UAPR, USPG, UPH, UTP, UGL, UKET, UBIL, UNIT, UROB, ULEU, UEPI, UWBC, URBC, UBAC, CAST, UCOM, BILUA  MICROBIOLOGY: Recent Results (from the past 240 hour(s))  Culture, Urine     Status: None   Collection Time: 08/29/15  9:09 PM  Result Value Ref Range Status   Specimen Description URINE, CATHETERIZED  Final   Special Requests NONE  Final   Culture >=100,000 COLONIES/mL ESCHERICHIA COLI  Final   Report Status 09/01/2015 FINAL  Final   Organism ID, Bacteria ESCHERICHIA COLI  Final      Susceptibility   Escherichia coli - MIC*    AMPICILLIN >=32 RESISTANT Resistant     CEFAZOLIN <=4 SENSITIVE Sensitive     CEFTRIAXONE <=1 SENSITIVE Sensitive     CIPROFLOXACIN <=0.25 SENSITIVE Sensitive     GENTAMICIN <=1 SENSITIVE Sensitive     IMIPENEM <=0.25 SENSITIVE Sensitive     NITROFURANTOIN <=16 SENSITIVE Sensitive     TRIMETH/SULFA <=20 SENSITIVE Sensitive     AMPICILLIN/SULBACTAM 4 SENSITIVE Sensitive     PIP/TAZO <=4 SENSITIVE Sensitive     * >=100,000 COLONIES/mL ESCHERICHIA COLI  Surgical pcr screen     Status: None   Collection Time: 09/01/15  4:09 AM  Result Value Ref Range Status   MRSA, PCR NEGATIVE NEGATIVE Final   Staphylococcus aureus NEGATIVE NEGATIVE Final    Comment:        The Xpert SA Assay (FDA approved for NASAL specimens in patients over 55 years of age), is one component of a comprehensive surveillance program.  Test performance has been validated by [facility] for patients greater than or equal to 66 year old. It is not intended to diagnose infection nor to guide or monitor treatment.   MRSA PCR Screening     Status: None   Collection Time: 09/04/15  7:03 PM  Result Value Ref Range Status   MRSA by PCR NEGATIVE NEGATIVE  Final    Comment:        The GeneXpert MRSA Assay (FDA approved for NASAL specimens only), is one component of a comprehensive MRSA colonization surveillance program. It is not intended to diagnose MRSA infection nor to guide or monitor treatment for MRSA infections.     RADIOLOGY STUDIES/RESULTS: Dg Chest 2 View  08/29/2015  CLINICAL DATA:  One month history of chest pain and hoarse voice. EXAM: CHEST  2 VIEW COMPARISON:  None. FINDINGS: The heart is borderline and enlarged. Mild tortuosity of the thoracic aorta. Low lung volumes with mild vascular crowding and streaky basilar atelectasis. There also bronchitic changes which could be acute or chronic. No infiltrates or effusions. The bony thorax is intact. IMPRESSION: Acute versus chronic bronchitic change.  No infiltrates or effusions Electronically Signed   By: Marijo Sanes M.D.   On: 08/29/2015 16:15   Ct Soft Tissue Neck W Contrast  08/29/2015  CLINICAL DATA:  60 year old  female with abnormal full waist for 1 month. Abdominal pain. Three days of vomiting. Initial encounter. EXAM: CT NECK WITH CONTRAST TECHNIQUE: Multidetector CT imaging of the neck was performed using the standard protocol following the bolus administration of intravenous contrast. CONTRAST:  43m OMNIPAQUE IOHEXOL 300 MG/ML SOLN in conjunction with contrast enhanced imaging of the chest, abdomen, and pelvis reported separately. COMPARISON:  CT chest abdomen and pelvis from today reported separately FINDINGS: Pharynx and larynx: Bulky supraglottic laryngeal tumor with marked soft tissue enlargement of the bilateral aryepiglottic folds and anterior commissure up to 17 mm in thickness. Tumor appears inseparable from the undersurface of the strap muscles suggesting extension through the thyroid cartilage bilaterally. Posterior hypopharynx involvement. The epiglottis is thickened, nodular, and distorted. Furthermore, the vallecula is mostly effaced and soft tissue at the base of  tongue appears nodular and thickened. All told, tumor encompasses 37 x 40 x 58 mm (AP by transverse by CC). The other laryngeal cartilages appear spared. The palatine tonsils, soft palate, and nasopharynx appear within normal limits. Superior parapharyngeal spaces and retropharyngeal space are within normal limits. Salivary glands: Sublingual space, submandibular glands, and parotid glands are within normal limits. Thyroid: Coarsely calcified 2.3 cm right thyroid nodule. Subcentimeter left lobe nodule. Lymph nodes: Abnormal right level 3 node measuring 9 mm at the level of the thyroid cartilage (series 1, image 57). Small but asymmetric 5 mm right level IIIa lymph node at the level of the hyoid bone on the right (series 1, image 49). Bilateral level 2A nodes appear symmetric measuring 8-9 mm in thickness. No level 4, 5, or level 1 lymphadenopathy. Vascular: Suboptimal intravascular contrast timing, but major vascular structures in the neck and at the skullbase appear to remain patent. Left greater than right carotid bifurcation calcified atherosclerosis. Limited intracranial: Negative.  Partially empty sella. Visualized orbits: Negative. Mastoids and visualized paranasal sinuses: Visualized paranasal sinuses and mastoids are clear. Skeleton: Mostly absent dentition. Periapical lucency about the residual left mandible molar. No osseous metastatic disease identified. Upper chest: Reported separately today. IMPRESSION: 1. 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 2. Suspect metastatic nodal disease at level 3 on the right (small but asymmetric up to 9 mm nodes). Bilateral level 2 nodes are indeterminate and also measure up to 9 mm individually. No cystic or necrotic nodes in the neck. 3. See CT chest abdomen and pelvis from today reported separately. Electronically Signed   By: HGenevie AnnM.D.   On: 08/29/2015 19:56   Ct Chest  W Contrast  08/29/2015  CLINICAL DATA:  60year old female with 2-3 day history of vomiting. Abdominal pain for the past month. Loss of voice. EXAM: CT CHEST, ABDOMEN, AND PELVIS WITH CONTRAST TECHNIQUE: Multidetector CT imaging of the chest, abdomen and pelvis was performed following the standard protocol during bolus administration of intravenous contrast. CONTRAST:  752mOMNIPAQUE IOHEXOL 300 MG/ML  SOLN COMPARISON:  No priors. FINDINGS: CT CHEST FINDINGS Mediastinum/Lymph Nodes: Heart size is normal. There is no significant pericardial fluid, thickening or pericardial calcification. There is atherosclerosis of the thoracic aorta, the great vessels of the mediastinum and the coronary arteries, including calcified atherosclerotic plaque in the left main, left anterior descending, left circumflex and right coronary arteries. Calcifications of the aortic valve and mitral valve/annulus. No pathologically enlarged mediastinal or hilar lymph nodes. Esophagus is unremarkable in appearance. No axillary lymphadenopathy. Lungs/Pleura: 4 mm subpleural nodule in the lateral segment of the  right middle lobe (image 31 of series 4). 4 mm subpleural nodule in the posterior left lower lobe (image 38 of series 4). No other suspicious appearing pulmonary nodules or masses. No acute consolidative airspace disease. No pleural effusions. Musculoskeletal/Soft Tissues: There are no aggressive appearing lytic or blastic lesions noted in the visualized portions of the skeleton. CT ABDOMEN AND PELVIS FINDINGS Hepatobiliary: No cystic or solid hepatic lesions. No intra or extrahepatic biliary ductal dilatation. 7 mm calcified gallstone lying dependently in the gallbladder. No current findings to suggest an acute cholecystitis at this time. Pancreas: No pancreatic mass. No pancreatic ductal dilatation. No pancreatic or peripancreatic fluid or inflammatory changes. Spleen: Unremarkable. Adrenals/Urinary Tract: In the lower pole of the left  kidney there is a 5.9 x 6.1 x 6.3 cm heterogeneously enhancing lesion highly concerning for renal cell carcinoma. The inferior aspect of this lesion comes in very close proximity to the anterior aspect of the left psoas muscle and quadratus lumborum muscle, however, there appears to be an intervening fat plane at this time. The lesion is well separated from the left renal vein. Right kidney and right adrenal gland are normal in appearance. 1.2 x 1.4 cm indeterminate nodule in the lateral limb of the left adrenal gland. No hydroureteronephrosis. Urinary bladder is largely decompressed, but otherwise unremarkable in appearance. Stomach/Bowel: Normal appearance of the stomach. No pathologic dilatation of small bowel or colon. A few scattered colonic diverticulae are noted, without surrounding inflammatory changes to suggest an acute diverticulitis at this time. Appendix is normal. Vascular/Lymphatic: Atherosclerosis throughout the abdominal and pelvic vasculature, without evidence of aneurysm or dissection. Left renal vein is widely patent and separate from the lower pole mass. No lymphadenopathy noted in the abdomen or pelvis. Reproductive: Uterus and ovaries are unremarkable in appearance. Other: No significant volume of ascites.  No pneumoperitoneum. Musculoskeletal: There are no aggressive appearing lytic or blastic lesions noted in the visualized portions of the skeleton. IMPRESSION: 1. No acute findings in the abdomen or pelvis to account for the patient's symptoms. 2. However, there is a 5.9 x 6.1 x 6.3 cm heterogeneously enhancing mass in the lower pole of the left kidney highly concerning for renal cell carcinoma. At this time, the lesion appears likely to be encapsulated within Gerota's fascia (although it comes very close to the left psoas and quadratus lumborum musculature), does not involve the left renal vein, and does not appear to be associated with lymphadenopathy. Nonemergent Urologic consultation for  surgical resection is strongly recommended in the near future. 3. 1.2 x 1.4 cm indeterminate nodule in the left adrenal gland. Attention on followup studies is recommended, as a metastatic lesion is not excluded. 4. 4 mm subpleural nodules in the right middle lobe and left lower lobe. These are highly nonspecific, and favored to represent subpleural lymph nodes, but attention on followup studies is recommended to ensure stability. 5. Cholelithiasis without evidence of acute cholecystitis at this time. 6. Atherosclerosis, including left main and 3 vessel coronary artery disease. Please note that although the presence of coronary artery calcium documents the presence of coronary artery disease, the severity of this disease and any potential stenosis cannot be assessed on this non-gated CT examination. Assessment for potential risk factor modification, dietary therapy or pharmacologic therapy may be warranted, if clinically indicated. 7. There are calcifications of the aortic valve and mitral valve/annulus. Echocardiographic correlation for evaluation of potential valvular dysfunction may be warranted if clinically indicated. 8. Colonic diverticulosis without evidence of acute diverticulitis at this  time. 9. Additional incidental findings, as above. These results were called by telephone at the time of interpretation on 08/29/2015 at 7:34 pm to Dr. Davonna Belling, who verbally acknowledged these results. Electronically Signed   By: Vinnie Langton M.D.   On: 08/29/2015 19:37   Ct Abdomen Pelvis W Contrast  08/29/2015  CLINICAL DATA:  60 year old female with 2-3 day history of vomiting. Abdominal pain for the past month. Loss of voice. EXAM: CT CHEST, ABDOMEN, AND PELVIS WITH CONTRAST TECHNIQUE: Multidetector CT imaging of the chest, abdomen and pelvis was performed following the standard protocol during bolus administration of intravenous contrast. CONTRAST:  74m OMNIPAQUE IOHEXOL 300 MG/ML  SOLN COMPARISON:  No  priors. FINDINGS: CT CHEST FINDINGS Mediastinum/Lymph Nodes: Heart size is normal. There is no significant pericardial fluid, thickening or pericardial calcification. There is atherosclerosis of the thoracic aorta, the great vessels of the mediastinum and the coronary arteries, including calcified atherosclerotic plaque in the left main, left anterior descending, left circumflex and right coronary arteries. Calcifications of the aortic valve and mitral valve/annulus. No pathologically enlarged mediastinal or hilar lymph nodes. Esophagus is unremarkable in appearance. No axillary lymphadenopathy. Lungs/Pleura: 4 mm subpleural nodule in the lateral segment of the right middle lobe (image 31 of series 4). 4 mm subpleural nodule in the posterior left lower lobe (image 38 of series 4). No other suspicious appearing pulmonary nodules or masses. No acute consolidative airspace disease. No pleural effusions. Musculoskeletal/Soft Tissues: There are no aggressive appearing lytic or blastic lesions noted in the visualized portions of the skeleton. CT ABDOMEN AND PELVIS FINDINGS Hepatobiliary: No cystic or solid hepatic lesions. No intra or extrahepatic biliary ductal dilatation. 7 mm calcified gallstone lying dependently in the gallbladder. No current findings to suggest an acute cholecystitis at this time. Pancreas: No pancreatic mass. No pancreatic ductal dilatation. No pancreatic or peripancreatic fluid or inflammatory changes. Spleen: Unremarkable. Adrenals/Urinary Tract: In the lower pole of the left kidney there is a 5.9 x 6.1 x 6.3 cm heterogeneously enhancing lesion highly concerning for renal cell carcinoma. The inferior aspect of this lesion comes in very close proximity to the anterior aspect of the left psoas muscle and quadratus lumborum muscle, however, there appears to be an intervening fat plane at this time. The lesion is well separated from the left renal vein. Right kidney and right adrenal gland are normal  in appearance. 1.2 x 1.4 cm indeterminate nodule in the lateral limb of the left adrenal gland. No hydroureteronephrosis. Urinary bladder is largely decompressed, but otherwise unremarkable in appearance. Stomach/Bowel: Normal appearance of the stomach. No pathologic dilatation of small bowel or colon. A few scattered colonic diverticulae are noted, without surrounding inflammatory changes to suggest an acute diverticulitis at this time. Appendix is normal. Vascular/Lymphatic: Atherosclerosis throughout the abdominal and pelvic vasculature, without evidence of aneurysm or dissection. Left renal vein is widely patent and separate from the lower pole mass. No lymphadenopathy noted in the abdomen or pelvis. Reproductive: Uterus and ovaries are unremarkable in appearance. Other: No significant volume of ascites.  No pneumoperitoneum. Musculoskeletal: There are no aggressive appearing lytic or blastic lesions noted in the visualized portions of the skeleton. IMPRESSION: 1. No acute findings in the abdomen or pelvis to account for the patient's symptoms. 2. However, there is a 5.9 x 6.1 x 6.3 cm heterogeneously enhancing mass in the lower pole of the left kidney highly concerning for renal cell carcinoma. At this time, the lesion appears likely to be encapsulated within Gerota's fascia (although  it comes very close to the left psoas and quadratus lumborum musculature), does not involve the left renal vein, and does not appear to be associated with lymphadenopathy. Nonemergent Urologic consultation for surgical resection is strongly recommended in the near future. 3. 1.2 x 1.4 cm indeterminate nodule in the left adrenal gland. Attention on followup studies is recommended, as a metastatic lesion is not excluded. 4. 4 mm subpleural nodules in the right middle lobe and left lower lobe. These are highly nonspecific, and favored to represent subpleural lymph nodes, but attention on followup studies is recommended to ensure  stability. 5. Cholelithiasis without evidence of acute cholecystitis at this time. 6. Atherosclerosis, including left main and 3 vessel coronary artery disease. Please note that although the presence of coronary artery calcium documents the presence of coronary artery disease, the severity of this disease and any potential stenosis cannot be assessed on this non-gated CT examination. Assessment for potential risk factor modification, dietary therapy or pharmacologic therapy may be warranted, if clinically indicated. 7. There are calcifications of the aortic valve and mitral valve/annulus. Echocardiographic correlation for evaluation of potential valvular dysfunction may be warranted if clinically indicated. 8. Colonic diverticulosis without evidence of acute diverticulitis at this time. 9. Additional incidental findings, as above. These results were called by telephone at the time of interpretation on 08/29/2015 at 7:34 pm to Dr. Davonna Belling, who verbally acknowledged these results. Electronically Signed   By: Vinnie Langton M.D.   On: 08/29/2015 19:37   Ir Angiogram Visceral Selective  09/07/2015  INDICATION: History of duodenal ulcer now with acute upper GI bleed. Please perform mesenteric arteriogram and potential percutaneous coil embolization. EXAM: 1. ULTRASOUND GUIDANCE FOR ARTERIAL ACCESS. 2. SELECTIVE CELIAC AND SUPERIOR MESENTERIC ARTERIOGRAMS 3. SELECTED PROPER HEPATIC ARTERIOGRAM 4. SELECTIVE GASTRODUODENAL ARTERIOGRAM AND PERCUTANEOUS COIL EMBOLIZATION MEDICATIONS: None ANESTHESIA/SEDATION: Fentanyl 75 mcg IV; Versed 1.5 mg IV Moderate Sedation Time: 65 minutes; The patient was continuously monitored during the procedure by the interventional radiology nurse under my direct supervision. CONTRAST:  83m OMNIPAQUE IOHEXOL 300 MG/ML  SOLN FLUOROSCOPY TIME:  Fluoroscopy Time: 18 minutes 24 seconds (1595 mGy). COMPLICATIONS: SIR Level A - No therapy, no consequence. The procedure was complicated by  non targeted embolization of a a 2 mm x 4 mm non fiber interlock coil within a cranial subsegmental branch vessel of the splenic artery. PROCEDURE: Informed consent was obtained from the patient following explanation of the procedure, risks, benefits and alternatives. The patient understands, agrees and consents for the procedure. All questions were addressed. A time out was performed prior to the initiation of the procedure. Maximal barrier sterile technique utilized including caps, mask, sterile gowns, sterile gloves, large sterile drape, hand hygiene, and Betadine prep. The right femoral head was marked fluoroscopically. Under ultrasound guidance, the right common femoral artery was accessed with a micropuncture kit after the overlying soft tissues were anesthetized with 1% lidocaine. An ultrasound image was saved for documentation purposes. The micropuncture sheath was exchanged for a 5 [place] sheath over a Bentson wire. A closure arteriogram was performed through the side of the sheath confirming access within the right common femoral artery. Over a Bentson wire, a Mickelson catheter was advanced to the level of the thoracic aorta where it was back bled and flushed. The catheter was then utilized to select the celiac artery and a selective celiac arteriogram was performed. With the use of a Fathom micro wire, a regular Renegade micro catheter was utilized to select the proper hepatic artery  and a selective proper hepatic arteriogram was performed. The microcatheter was then utilized to select the gastroduodenal artery and a selective gastroduodenal arteriogram was performed. The micro catheter was advanced further into the distal aspect of the gastroduodenal artery. Following sub selective injection, a 2 mm x 4 mm non fibered interlock coil was attempted to be deployed within the distal aspect of the gastroduodenal artery however it became apparent that the micro catheter I was given was NOT a regular  sized catheter but rather was a high-flow catheter. As such, coil became entrapped within the distal aspect of the micro catheter. With some manipulation, the coil was ultimately safely deployed within a cranial subsegmental branch of the splenic artery. The incorrect the microcatheter was discarded and a regular Renegade micro catheter was again utilized to select the gastroduodenal artery. Sub selective injection was performed and the gastroduodenal artery was percutaneously coil embolized with multiple overlapping 2 mm, 3 mm and 4 mm interlock fibered and non-fibered coils to near the vessel's origin. A completion proper hepatic arteriogram was performed. The micro catheter was removed and completion celiac and superior mesenteric arteriograms were performed. Images reviewed and the procedure was terminated. All wires, catheters and sheaths were removed from the patient. Hemostasis was achieved at the right groin access site with deployment of an Exoseal closure device. A dressing was placed. The patient tolerated the procedure well without immediate postprocedural complication. FINDINGS: Celiac arteriogram demonstrates a conventional branching pattern. Superior mesenteric arteriogram failed to delineate any retrograde contribution to the gastroduodenal artery. Selective proper hepatic arteriogram demonstrates a markedly diminutive and irregular appearing gastroduodenal artery which was confirmed with selective gastroduodenal arteriogram. Ultimately successful percutaneous coil embolization of the gastroduodenal artery to near the vessels origin. Note, procedure was complicated by non target embolization of a cranial subsegmental branch vessel of the splenic artery. Completion proper hepatic arteriogram and demonstrates complete occlusion of the GDA. Completion celiac arteriogram demonstrates a hypertrophied gastroepiploic artery arising from the left gastric artery without definitive contribution to the duodenal  bulb. IMPRESSION: 1. Technically successful prophylactic percutaneous coil embolization of the gastroduodenal artery for acute upper GI bleed. 2. Procedure complicated by non target embolization of a cranial subsegmental branch vessel of the splenic artery secondary to mismatch of catheter sizing. This non target coil is felt to be of no clinical significance. Above findings were discussed with Dr. Lala Lund , at the time of procedure completion. Electronically Signed   By: Sandi Mariscal M.D.   On: 09/07/2015 18:09   Ir Angiogram Visceral Selective  09/07/2015  INDICATION: History of duodenal ulcer now with acute upper GI bleed. Please perform mesenteric arteriogram and potential percutaneous coil embolization. EXAM: 1. ULTRASOUND GUIDANCE FOR ARTERIAL ACCESS. 2. SELECTIVE CELIAC AND SUPERIOR MESENTERIC ARTERIOGRAMS 3. SELECTED PROPER HEPATIC ARTERIOGRAM 4. SELECTIVE GASTRODUODENAL ARTERIOGRAM AND PERCUTANEOUS COIL EMBOLIZATION MEDICATIONS: None ANESTHESIA/SEDATION: Fentanyl 75 mcg IV; Versed 1.5 mg IV Moderate Sedation Time: 65 minutes; The patient was continuously monitored during the procedure by the interventional radiology nurse under my direct supervision. CONTRAST:  34m OMNIPAQUE IOHEXOL 300 MG/ML  SOLN FLUOROSCOPY TIME:  Fluoroscopy Time: 18 minutes 24 seconds (1595 mGy). COMPLICATIONS: SIR Level A - No therapy, no consequence. The procedure was complicated by non targeted embolization of a a 2 mm x 4 mm non fiber interlock coil within a cranial subsegmental branch vessel of the splenic artery. PROCEDURE: Informed consent was obtained from the patient following explanation of the procedure, risks, benefits and alternatives. The patient understands, agrees and consents  for the procedure. All questions were addressed. A time out was performed prior to the initiation of the procedure. Maximal barrier sterile technique utilized including caps, mask, sterile gowns, sterile gloves, large sterile drape, hand  hygiene, and Betadine prep. The right femoral head was marked fluoroscopically. Under ultrasound guidance, the right common femoral artery was accessed with a micropuncture kit after the overlying soft tissues were anesthetized with 1% lidocaine. An ultrasound image was saved for documentation purposes. The micropuncture sheath was exchanged for a 5 [place] vascular sheath over a Bentson wire. A closure arteriogram was performed through the side of the sheath confirming access within the right common femoral artery. Over a Bentson wire, a Mickelson catheter was advanced to the level of the thoracic aorta where it was back bled and flushed. The catheter was then utilized to select the celiac artery and a selective celiac arteriogram was performed. With the use of a Fathom micro wire, a regular Renegade micro catheter was utilized to select the proper hepatic artery and a selective proper hepatic arteriogram was performed. The microcatheter was then utilized to select the gastroduodenal artery and a selective gastroduodenal arteriogram was performed. The micro catheter was advanced further into the distal aspect of the gastroduodenal artery. Following sub selective injection, a 2 mm x 4 mm non fibered interlock coil was attempted to be deployed within the distal aspect of the gastroduodenal artery however it became apparent that the micro catheter I was given was NOT a regular sized catheter but rather was a high-flow catheter. As such, coil became entrapped within the distal aspect of the micro catheter. With some manipulation, the coil was ultimately safely deployed within a cranial subsegmental branch of the splenic artery. The incorrect the microcatheter was discarded and a regular Renegade micro catheter was again utilized to select the gastroduodenal artery. Sub selective injection was performed and the gastroduodenal artery was percutaneously coil embolized with multiple overlapping 2 mm, 3 mm and 4 mm interlock  fibered and non-fibered coils to near the vessel's origin. A completion proper hepatic arteriogram was performed. The micro catheter was removed and completion celiac and superior mesenteric arteriograms were performed. Images reviewed and the procedure was terminated. All wires, catheters and sheaths were removed from the patient. Hemostasis was achieved at the right groin access site with deployment of an Exoseal closure device. A dressing was placed. The patient tolerated the procedure well without immediate postprocedural complication. FINDINGS: Celiac arteriogram demonstrates a conventional branching pattern. Superior mesenteric arteriogram failed to delineate any retrograde contribution to the gastroduodenal artery. Selective proper hepatic arteriogram demonstrates a markedly diminutive and irregular appearing gastroduodenal artery which was confirmed with selective gastroduodenal arteriogram. Ultimately successful percutaneous coil embolization of the gastroduodenal artery to near the vessels origin. Note, procedure was complicated by non target embolization of a cranial subsegmental branch vessel of the splenic artery. Completion proper hepatic arteriogram and demonstrates complete occlusion of the GDA. Completion celiac arteriogram demonstrates a hypertrophied gastroepiploic artery arising from the left gastric artery without definitive contribution to the duodenal bulb. IMPRESSION: 1. Technically successful prophylactic percutaneous coil embolization of the gastroduodenal artery for acute upper GI bleed. 2. Procedure complicated by non target embolization of a cranial subsegmental branch vessel of the splenic artery secondary to mismatch of catheter sizing. This non target coil is felt to be of no clinical significance. Above findings were discussed with Dr. Susa Raring , at the time of procedure completion. Electronically Signed   By: Simonne Come M.D.   On:  09/07/2015 18:09   Ir Angiogram Selective  Each Additional Vessel  09/07/2015  INDICATION: History of duodenal ulcer now with acute upper GI bleed. Please perform mesenteric arteriogram and potential percutaneous coil embolization. EXAM: 1. ULTRASOUND GUIDANCE FOR ARTERIAL ACCESS. 2. SELECTIVE CELIAC AND SUPERIOR MESENTERIC ARTERIOGRAMS 3. SELECTED PROPER HEPATIC ARTERIOGRAM 4. SELECTIVE GASTRODUODENAL ARTERIOGRAM AND PERCUTANEOUS COIL EMBOLIZATION MEDICATIONS: None ANESTHESIA/SEDATION: Fentanyl 75 mcg IV; Versed 1.5 mg IV Moderate Sedation Time: 65 minutes; The patient was continuously monitored during the procedure by the interventional radiology nurse under my direct supervision. CONTRAST:  27mL OMNIPAQUE IOHEXOL 300 MG/ML  SOLN FLUOROSCOPY TIME:  Fluoroscopy Time: 18 minutes 24 seconds (1595 mGy). COMPLICATIONS: SIR Level A - No therapy, no consequence. The procedure was complicated by non targeted embolization of a a 2 mm x 4 mm non fiber interlock coil within a cranial subsegmental branch vessel of the splenic artery. PROCEDURE: Informed consent was obtained from the patient following explanation of the procedure, risks, benefits and alternatives. The patient understands, agrees and consents for the procedure. All questions were addressed. A time out was performed prior to the initiation of the procedure. Maximal barrier sterile technique utilized including caps, mask, sterile gowns, sterile gloves, large sterile drape, hand hygiene, and Betadine prep. The right femoral head was marked fluoroscopically. Under ultrasound guidance, the right common femoral artery was accessed with a micropuncture kit after the overlying soft tissues were anesthetized with 1% lidocaine. An ultrasound image was saved for documentation purposes. The micropuncture sheath was exchanged for a 5 [place] vascular sheath over a Bentson wire. A closure arteriogram was performed through the side of the sheath confirming access within the right common femoral artery. Over a Bentson  wire, a Mickelson catheter was advanced to the level of the thoracic aorta where it was back bled and flushed. The catheter was then utilized to select the celiac artery and a selective celiac arteriogram was performed. With the use of a Fathom micro wire, a regular Renegade micro catheter was utilized to select the proper hepatic artery and a selective proper hepatic arteriogram was performed. The microcatheter was then utilized to select the gastroduodenal artery and a selective gastroduodenal arteriogram was performed. The micro catheter was advanced further into the distal aspect of the gastroduodenal artery. Following sub selective injection, a 2 mm x 4 mm non fibered interlock coil was attempted to be deployed within the distal aspect of the gastroduodenal artery however it became apparent that the micro catheter I was given was NOT a regular sized catheter but rather was a high-flow catheter. As such, coil became entrapped within the distal aspect of the micro catheter. With some manipulation, the coil was ultimately safely deployed within a cranial subsegmental branch of the splenic artery. The incorrect the microcatheter was discarded and a regular Renegade micro catheter was again utilized to select the gastroduodenal artery. Sub selective injection was performed and the gastroduodenal artery was percutaneously coil embolized with multiple overlapping 2 mm, 3 mm and 4 mm interlock fibered and non-fibered coils to near the vessel's origin. A completion proper hepatic arteriogram was performed. The micro catheter was removed and completion celiac and superior mesenteric arteriograms were performed. Images reviewed and the procedure was terminated. All wires, catheters and sheaths were removed from the patient. Hemostasis was achieved at the right groin access site with deployment of an Exoseal closure device. A dressing was placed. The patient tolerated the procedure well without immediate postprocedural  complication. FINDINGS: Celiac arteriogram demonstrates a conventional branching pattern.  Superior mesenteric arteriogram failed to delineate any retrograde contribution to the gastroduodenal artery. Selective proper hepatic arteriogram demonstrates a markedly diminutive and irregular appearing gastroduodenal artery which was confirmed with selective gastroduodenal arteriogram. Ultimately successful percutaneous coil embolization of the gastroduodenal artery to near the vessels origin. Note, procedure was complicated by non target embolization of a cranial subsegmental branch vessel of the splenic artery. Completion proper hepatic arteriogram and demonstrates complete occlusion of the GDA. Completion celiac arteriogram demonstrates a hypertrophied gastroepiploic artery arising from the left gastric artery without definitive contribution to the duodenal bulb. IMPRESSION: 1. Technically successful prophylactic percutaneous coil embolization of the gastroduodenal artery for acute upper GI bleed. 2. Procedure complicated by non target embolization of a cranial subsegmental branch vessel of the splenic artery secondary to mismatch of catheter sizing. This non target coil is felt to be of no clinical significance. Above findings were discussed with Dr. Lala Lund , at the time of procedure completion. Electronically Signed   By: Sandi Mariscal M.D.   On: 09/07/2015 18:09   Ir Angiogram Follow Up Study  09/07/2015  INDICATION: History of duodenal ulcer now with acute upper GI bleed. Please perform mesenteric arteriogram and potential percutaneous coil embolization. EXAM: 1. ULTRASOUND GUIDANCE FOR ARTERIAL ACCESS. 2. SELECTIVE CELIAC AND SUPERIOR MESENTERIC ARTERIOGRAMS 3. SELECTED PROPER HEPATIC ARTERIOGRAM 4. SELECTIVE GASTRODUODENAL ARTERIOGRAM AND PERCUTANEOUS COIL EMBOLIZATION MEDICATIONS: None ANESTHESIA/SEDATION: Fentanyl 75 mcg IV; Versed 1.5 mg IV Moderate Sedation Time: 65 minutes; The patient was  continuously monitored during the procedure by the interventional radiology nurse under my direct supervision. CONTRAST:  36m OMNIPAQUE IOHEXOL 300 MG/ML  SOLN FLUOROSCOPY TIME:  Fluoroscopy Time: 18 minutes 24 seconds (1595 mGy). COMPLICATIONS: SIR Level A - No therapy, no consequence. The procedure was complicated by non targeted embolization of a a 2 mm x 4 mm non fiber interlock coil within a cranial subsegmental branch vessel of the splenic artery. PROCEDURE: Informed consent was obtained from the patient following explanation of the procedure, risks, benefits and alternatives. The patient understands, agrees and consents for the procedure. All questions were addressed. A time out was performed prior to the initiation of the procedure. Maximal barrier sterile technique utilized including caps, mask, sterile gowns, sterile gloves, large sterile drape, hand hygiene, and Betadine prep. The right femoral head was marked fluoroscopically. Under ultrasound guidance, the right common femoral artery was accessed with a micropuncture kit after the overlying soft tissues were anesthetized with 1% lidocaine. An ultrasound image was saved for documentation purposes. The micropuncture sheath was exchanged for a 5 [place] sheath over a Bentson wire. A closure arteriogram was performed through the side of the sheath confirming access within the right common femoral artery. Over a Bentson wire, a Mickelson catheter was advanced to the level of the thoracic aorta where it was back bled and flushed. The catheter was then utilized to select the celiac artery and a selective celiac arteriogram was performed. With the use of a Fathom micro wire, a regular Renegade micro catheter was utilized to select the proper hepatic artery and a selective proper hepatic arteriogram was performed. The microcatheter was then utilized to select the gastroduodenal artery and a selective gastroduodenal arteriogram was performed. The micro  catheter was advanced further into the distal aspect of the gastroduodenal artery. Following sub selective injection, a 2 mm x 4 mm non fibered interlock coil was attempted to be deployed within the distal aspect of the gastroduodenal artery however it became apparent that the micro catheter I  was given was NOT a regular sized catheter but rather was a high-flow catheter. As such, coil became entrapped within the distal aspect of the micro catheter. With some manipulation, the coil was ultimately safely deployed within a cranial subsegmental branch of the splenic artery. The incorrect the microcatheter was discarded and a regular Renegade micro catheter was again utilized to select the gastroduodenal artery. Sub selective injection was performed and the gastroduodenal artery was percutaneously coil embolized with multiple overlapping 2 mm, 3 mm and 4 mm interlock fibered and non-fibered coils to near the vessel's origin. A completion proper hepatic arteriogram was performed. The micro catheter was removed and completion celiac and superior mesenteric arteriograms were performed. Images reviewed and the procedure was terminated. All wires, catheters and sheaths were removed from the patient. Hemostasis was achieved at the right groin access site with deployment of an Exoseal closure device. A dressing was placed. The patient tolerated the procedure well without immediate postprocedural complication. FINDINGS: Celiac arteriogram demonstrates a conventional branching pattern. Superior mesenteric arteriogram failed to delineate any retrograde contribution to the gastroduodenal artery. Selective proper hepatic arteriogram demonstrates a markedly diminutive and irregular appearing gastroduodenal artery which was confirmed with selective gastroduodenal arteriogram. Ultimately successful percutaneous coil embolization of the gastroduodenal artery to near the vessels origin. Note, procedure was complicated by non target  embolization of a cranial subsegmental branch vessel of the splenic artery. Completion proper hepatic arteriogram and demonstrates complete occlusion of the GDA. Completion celiac arteriogram demonstrates a hypertrophied gastroepiploic artery arising from the left gastric artery without definitive contribution to the duodenal bulb. IMPRESSION: 1. Technically successful prophylactic percutaneous coil embolization of the gastroduodenal artery for acute upper GI bleed. 2. Procedure complicated by non target embolization of a cranial subsegmental branch vessel of the splenic artery secondary to mismatch of catheter sizing. This non target coil is felt to be of no clinical significance. Above findings were discussed with Dr. Lala Lund , at the time of procedure completion. Electronically Signed   By: Sandi Mariscal M.D.   On: 09/07/2015 18:09   Ir US Guide Vasc Access Right  09/07/2015  INDICATION: History of duodenal ulcer now with acute upper GI bleed. Please perform mesenteric arteriogram and potential percutaneous coil embolization. EXAM: 1. ULTRASOUND GUIDANCE FOR ARTERIAL ACCESS. 2. SELECTIVE CELIAC AND SUPERIOR MESENTERIC ARTERIOGRAMS 3. SELECTED PROPER HEPATIC ARTERIOGRAM 4. SELECTIVE GASTRODUODENAL ARTERIOGRAM AND PERCUTANEOUS COIL EMBOLIZATION MEDICATIONS: None ANESTHESIA/SEDATION: Fentanyl 75 mcg IV; Versed 1.5 mg IV Moderate Sedation Time: 65 minutes; The patient was continuously monitored during the procedure by the interventional radiology nurse under my direct supervision. CONTRAST:  21m OMNIPAQUE IOHEXOL 300 MG/ML  SOLN FLUOROSCOPY TIME:  Fluoroscopy Time: 18 minutes 24 seconds (1595 mGy). COMPLICATIONS: SIR Level A - No therapy, no consequence. The procedure was complicated by non targeted embolization of a a 2 mm x 4 mm non fiber interlock coil within a cranial subsegmental branch vessel of the splenic artery. PROCEDURE: Informed consent was obtained from the patient following explanation of the  procedure, risks, benefits and alternatives. The patient understands, agrees and consents for the procedure. All questions were addressed. A time out was performed prior to the initiation of the procedure. Maximal barrier sterile technique utilized including caps, mask, sterile gowns, sterile gloves, large sterile drape, hand hygiene, and Betadine prep. The right femoral head was marked fluoroscopically. Under ultrasound guidance, the right common femoral artery was accessed with a micropuncture kit after the overlying soft tissues were anesthetized with 1% lidocaine. An ultrasound image  was saved for documentation purposes. The micropuncture sheath was exchanged for a 5 [place] vascular sheath over a Bentson wire. A closure arteriogram was performed through the side of the sheath confirming access within the right common femoral artery. Over a Bentson wire, a Mickelson catheter was advanced to the level of the thoracic aorta where it was back bled and flushed. The catheter was then utilized to select the celiac artery and a selective celiac arteriogram was performed. With the use of a Fathom micro wire, a regular Renegade micro catheter was utilized to select the proper hepatic artery and a selective proper hepatic arteriogram was performed. The microcatheter was then utilized to select the gastroduodenal artery and a selective gastroduodenal arteriogram was performed. The micro catheter was advanced further into the distal aspect of the gastroduodenal artery. Following sub selective injection, a 2 mm x 4 mm non fibered interlock coil was attempted to be deployed within the distal aspect of the gastroduodenal artery however it became apparent that the micro catheter I was given was NOT a regular sized catheter but rather was a high-flow catheter. As such, coil became entrapped within the distal aspect of the micro catheter. With some manipulation, the coil was ultimately safely deployed within a cranial subsegmental  branch of the splenic artery. The incorrect the microcatheter was discarded and a regular Renegade micro catheter was again utilized to select the gastroduodenal artery. Sub selective injection was performed and the gastroduodenal artery was percutaneously coil embolized with multiple overlapping 2 mm, 3 mm and 4 mm interlock fibered and non-fibered coils to near the vessel's origin. A completion proper hepatic arteriogram was performed. The micro catheter was removed and completion celiac and superior mesenteric arteriograms were performed. Images reviewed and the procedure was terminated. All wires, catheters and sheaths were removed from the patient. Hemostasis was achieved at the right groin access site with deployment of an Exoseal closure device. A dressing was placed. The patient tolerated the procedure well without immediate postprocedural complication. FINDINGS: Celiac arteriogram demonstrates a conventional branching pattern. Superior mesenteric arteriogram failed to delineate any retrograde contribution to the gastroduodenal artery. Selective proper hepatic arteriogram demonstrates a markedly diminutive and irregular appearing gastroduodenal artery which was confirmed with selective gastroduodenal arteriogram. Ultimately successful percutaneous coil embolization of the gastroduodenal artery to near the vessels origin. Note, procedure was complicated by non target embolization of a cranial subsegmental branch vessel of the splenic artery. Completion proper hepatic arteriogram and demonstrates complete occlusion of the GDA. Completion celiac arteriogram demonstrates a hypertrophied gastroepiploic artery arising from the left gastric artery without definitive contribution to the duodenal bulb. IMPRESSION: 1. Technically successful prophylactic percutaneous coil embolization of the gastroduodenal artery for acute upper GI bleed. 2. Procedure complicated by non target embolization of a cranial subsegmental  branch vessel of the splenic artery secondary to mismatch of catheter sizing. This non target coil is felt to be of no clinical significance. Above findings were discussed with Dr. Lala Lund , at the time of procedure completion. Electronically Signed   By: Sandi Mariscal M.D.   On: 09/07/2015 18:09   Dg Chest Port 1 View  09/04/2015  CLINICAL DATA:  Central line placement. EXAM: PORTABLE CHEST 1 VIEW COMPARISON:  08/29/2015 FINDINGS: Endotracheal tube terminates 6 cm above the carina. Right internal jugular approach central venous catheter seen with tip at the expected location of superior vena cava. Cardiomediastinal silhouette is enlarged, likely exaggerated by portable technique and rotation. Mediastinal contours appear intact. There is no evidence of  focal airspace consolidation, pleural effusion or pneumothorax. Lung volumes are low. Osseous structures are without acute abnormality. Soft tissues are grossly normal. IMPRESSION: Low lung volumes. Endotracheal tube 6 cm above the carina. Advancement with 2 cm may be considered. Right-sided central venous catheter in satisfactory position radiographically. Electronically Signed   By: Fidela Salisbury M.D.   On: 09/04/2015 16:06   Dg Swallowing Func-speech Pathology  09/03/2015  Objective Swallowing Evaluation:   Patient Details Name: Ashley Ramsey MRN: [id number] Date of Birth: January 15, 1956 Today's Date: 09/03/2015 Time: SLP Start Time (ACUTE ONLY): 1124-SLP Stop Time (ACUTE ONLY): 1143 SLP Time Calculation (min) (ACUTE ONLY): 19 min Past Medical History: Past Medical History Diagnosis Date . Thyroid disease  . Hypertension  . Neck mass hospitalized 08/29/2015 . Type II diabetes mellitus ([place])  . Left kidney mass  . Anemia, chronic disease  Past Surgical History: Past Surgical History Procedure Laterality Date . No past surgeries   HPI: Pt is 60 y.o. female with h/o hypertension and NIDDM. Pt presented to ED with voice changes, difficult swallowing and  abdominal discomfort that have progressed over the last month. 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 Subjective: pt alert, eager for food/drink Assessment / Plan / Recommendation CHL IP CLINICAL IMPRESSIONS 09/03/2015 Therapy Diagnosis Severe pharyngeal phase dysphagia Clinical Impression Pt has a severe pharyngeal dysphaghia secondary to presence of supraglottic mass, which impedes bolus flow and decreased airway closure. All consistencies enter the airway during the swallow even with tsp-sized boluses. Aspiration is usually sensed, but she is not able to expel aspirates from her trachea. A chin tuck paired with an immediate cough facilitates clearance through the pharynx and clearance of penetrates with all consistencies tested; however, all liquids are then re-penetrated and aspirated after the swallow. Purees do not re-enter the airway, although aspiration risk remains due to residue. Recommend Dys 1 diet and pudding thick liquids with a chin tuck and immediate cough following all bites. Impact on safety and function Moderate aspiration risk;Risk for inadequate nutrition/hydration   CHL IP TREATMENT RECOMMENDATION 09/03/2015 Treatment Recommendations Therapy as outlined in treatment plan below   Prognosis 09/03/2015 Prognosis for Safe Diet Advancement Guarded Barriers to Reach Goals -- Barriers/Prognosis Comment -- CHL IP DIET RECOMMENDATION 09/03/2015 SLP Diet Recommendations Dysphagia 1 (Puree) solids;Pudding thick liquid Liquid Administration via Spoon Medication Administration Crushed with puree Compensations Slow rate;Small sips/bites;Chin tuck;Hard cough after swallow Postural Changes Remain semi-upright after after feeds/meals (Comment);Seated upright at 90 degrees   CHL IP OTHER RECOMMENDATIONS 09/03/2015 Recommended Consults -- Oral Care Recommendations Oral care BID Other Recommendations Order thickener from pharmacy;Prohibited food (jello, ice  cream, thin soups);Remove water pitcher   CHL IP FOLLOW UP RECOMMENDATIONS 09/03/2015 Follow up Recommendations Outpatient SLP;24 hour supervision/assistance   CHL IP FREQUENCY AND DURATION 09/03/2015 Speech Therapy Frequency (ACUTE ONLY) min 2x/week Treatment Duration 2 weeks      CHL IP ORAL PHASE 09/03/2015 Oral Phase WFL Oral - Pudding Teaspoon -- Oral - Pudding Cup -- Oral - Honey Teaspoon -- Oral - Honey Cup -- Oral - Nectar Teaspoon -- Oral - Nectar Cup -- Oral - Nectar Straw -- Oral - Thin Teaspoon -- Oral - Thin Cup -- Oral - Thin Straw -- Oral - Puree -- Oral - Mech Soft -- Oral - Regular -- Oral - Multi-Consistency -- Oral - Pill -- Oral Phase - Comment --  CHL IP PHARYNGEAL PHASE 09/03/2015 Pharyngeal Phase Impaired Pharyngeal- Pudding Teaspoon -- Pharyngeal --  Pharyngeal- Pudding Cup -- Pharyngeal -- Pharyngeal- Honey Teaspoon Reduced airway/laryngeal closure;Penetration/Aspiration during swallow;Penetration/Apiration after swallow;Pharyngeal residue - pyriform;Pharyngeal residue - posterior pharnyx;Compensatory strategies attempted (with notebox) Pharyngeal Material enters airway, passes BELOW cords and not ejected out despite cough attempt by patient Pharyngeal- Honey Cup -- Pharyngeal -- Pharyngeal- Nectar Teaspoon Reduced airway/laryngeal closure;Penetration/Aspiration during swallow;Penetration/Apiration after swallow;Pharyngeal residue - pyriform;Pharyngeal residue - posterior pharnyx;Compensatory strategies attempted (with notebox) Pharyngeal Material enters airway, passes BELOW cords and not ejected out despite cough attempt by patient Pharyngeal- Nectar Cup -- Pharyngeal -- Pharyngeal- Nectar Straw -- Pharyngeal -- Pharyngeal- Thin Teaspoon Reduced airway/laryngeal closure;Penetration/Aspiration during swallow;Penetration/Apiration after swallow;Pharyngeal residue - pyriform;Pharyngeal residue - posterior pharnyx;Compensatory strategies attempted (with notebox) Pharyngeal Material enters airway,  passes BELOW cords and not ejected out despite cough attempt by patient Pharyngeal- Thin Cup -- Pharyngeal -- Pharyngeal- Thin Straw Reduced airway/laryngeal closure;Penetration/Aspiration during swallow;Penetration/Apiration after swallow;Pharyngeal residue - pyriform;Pharyngeal residue - posterior pharnyx;Compensatory strategies attempted (with notebox) Pharyngeal Material enters airway, passes BELOW cords and not ejected out despite cough attempt by patient Pharyngeal- Puree Reduced airway/laryngeal closure;Penetration/Aspiration during swallow;Compensatory strategies attempted (with notebox);Pharyngeal residue - posterior pharnyx Pharyngeal Material enters airway, remains ABOVE vocal cords and not ejected out Pharyngeal- Mechanical Soft -- Pharyngeal -- Pharyngeal- Regular -- Pharyngeal -- Pharyngeal- Multi-consistency -- Pharyngeal -- Pharyngeal- Pill -- Pharyngeal -- Pharyngeal Comment --  CHL IP CERVICAL ESOPHAGEAL PHASE 09/03/2015 Cervical Esophageal Phase (No Data) Pudding Teaspoon -- Pudding Cup -- Honey Teaspoon -- Honey Cup -- Nectar Teaspoon -- Nectar Cup -- Nectar Straw -- Thin Teaspoon -- Thin Cup -- Thin Straw -- Puree -- Mechanical Soft -- Regular -- Multi-consistency -- Pill -- Cervical Esophageal Comment -- No flowsheet data found. Maxcine Ham, M.A. CCC-SLP [phone] Maxcine Ham 09/03/2015, 1:33 PM              Ir Re[facility] [place] [facility] [employer] Guide Roadmapping  09/07/2015  INDICATION: History of duodenal ulcer now with acute upper GI bleed. Please perform mesenteric arteriogram and potential percutaneous coil embolization. EXAM: 1. ULTRASOUND GUIDANCE FOR ARTERIAL ACCESS. 2. SELECTIVE CELIAC AND SUPERIOR MESENTERIC ARTERIOGRAMS 3. SELECTED PROPER HEPATIC ARTERIOGRAM 4. SELECTIVE GASTRODUODENAL ARTERIOGRAM AND PERCUTANEOUS COIL EMBOLIZATION MEDICATIONS: None ANESTHESIA/SEDATION: Fentanyl 75 mcg IV; Versed 1.5 mg IV Moderate Sedation Time: 65 minutes; The patient was  continuously monitored during the procedure by the interventional radiology nurse under my direct supervision. CONTRAST:  16mL OMNIPAQUE IOHEXOL 300 MG/ML  SOLN FLUOROSCOPY TIME:  Fluoroscopy Time: 18 minutes 24 seconds (1595 mGy). COMPLICATIONS: SIR Level A - No therapy, no consequence. The procedure was complicated by non targeted embolization of a a 2 mm x 4 mm non fiber interlock coil within a cranial subsegmental branch vessel of the splenic artery. PROCEDURE: Informed consent was obtained from the patient following explanation of the procedure, risks, benefits and alternatives. The patient understands, agrees and consents for the procedure. All questions were addressed. A time out was performed prior to the initiation of the procedure. Maximal barrier sterile technique utilized including caps, mask, sterile gowns, sterile gloves, large sterile drape, hand hygiene, and Betadine prep. The right femoral head was marked fluoroscopically. Under ultrasound guidance, the right common femoral artery was accessed with a micropuncture kit after the overlying soft tissues were anesthetized with 1% lidocaine. An ultrasound image was saved for documentation purposes. The micropuncture sheath was exchanged for a 5 [place] vascular sheath over a Bentson wire. A closure arteriogram was performed through the side of the sheath confirming access within the  right common femoral artery. Over a Bentson wire, a Mickelson catheter was advanced to the level of the thoracic aorta where it was back bled and flushed. The catheter was then utilized to select the celiac artery and a selective celiac arteriogram was performed. With the use of a Fathom micro wire, a regular Renegade micro catheter was utilized to select the proper hepatic artery and a selective proper hepatic arteriogram was performed. The microcatheter was then utilized to select the gastroduodenal artery and a selective gastroduodenal arteriogram was performed. The micro  catheter was advanced further into the distal aspect of the gastroduodenal artery. Following sub selective injection, a 2 mm x 4 mm non fibered interlock coil was attempted to be deployed within the distal aspect of the gastroduodenal artery however it became apparent that the micro catheter I was given was NOT a regular sized catheter but rather was a high-flow catheter. As such, coil became entrapped within the distal aspect of the micro catheter. With some manipulation, the coil was ultimately safely deployed within a cranial subsegmental branch of the splenic artery. The incorrect the microcatheter was discarded and a regular Renegade micro catheter was again utilized to select the gastroduodenal artery. Sub selective injection was performed and the gastroduodenal artery was percutaneously coil embolized with multiple overlapping 2 mm, 3 mm and 4 mm interlock fibered and non-fibered coils to near the vessel's origin. A completion proper hepatic arteriogram was performed. The micro catheter was removed and completion celiac and superior mesenteric arteriograms were performed. Images reviewed and the procedure was terminated. All wires, catheters and sheaths were removed from the patient. Hemostasis was achieved at the right groin access site with deployment of an Exoseal closure device. A dressing was placed. The patient tolerated the procedure well without immediate postprocedural complication. FINDINGS: Celiac arteriogram demonstrates a conventional branching pattern. Superior mesenteric arteriogram failed to delineate any retrograde contribution to the gastroduodenal artery. Selective proper hepatic arteriogram demonstrates a markedly diminutive and irregular appearing gastroduodenal artery which was confirmed with selective gastroduodenal arteriogram. Ultimately successful percutaneous coil embolization of the gastroduodenal artery to near the vessels origin. Note, procedure was complicated by non target  embolization of a cranial subsegmental branch vessel of the splenic artery. Completion proper hepatic arteriogram and demonstrates complete occlusion of the GDA. Completion celiac arteriogram demonstrates a hypertrophied gastroepiploic artery arising from the left gastric artery without definitive contribution to the duodenal bulb. IMPRESSION: 1. Technically successful prophylactic percutaneous coil embolization of the gastroduodenal artery for acute upper GI bleed. 2. Procedure complicated by non target embolization of a cranial subsegmental branch vessel of the splenic artery secondary to mismatch of catheter sizing. This non target coil is felt to be of no clinical significance. Above findings were discussed with Dr. Lala Lund , at the time of procedure completion. Electronically Signed   By: Sandi Mariscal M.D.   On: 09/07/2015 18:09    Signature  Thurnell Lose M.D on 09/08/2015 at 8:43 AM  Between 7am to 7pm - Pager - [phone], After 7pm go to [email or web address] - password [email or web address]  [phone]   LOS: 9 days

## 2015-09-08 NOTE — Progress Notes (Signed)
Around midnight on 09/08/2015 the patients daughter "Murline" appeared on the unit sobbing uncontrollably.  She called this RN into the room to "help her mother".  This RN responded immediately and upon entering the room found the patient seated on the [facility] with stable vital signs and a calm demeanor.  At this time an attempt was made top console the daughter to find out what the main concern was.  She was unable to express her concerns and kept repeating the phrase "Chiquita Loth, please help my mother come home".  After about 5 minutes of this behavior I returned my attention to the patient who had finished on the [facility] and needed to be helped back into bed.  She returned to bed without incident and at this time the daughter left the room and continued crying in the hall.  This RN had a short discussion regarding the patient's current care and the continued need for her hospitalization.  At this point the daughter walked away from the unit and was seen crying in the waiting room accompanied by another individual.  The [facility] nurse, Rocco Serene,  was called and informed of the situation and she came to visit the unit to get a clearer picture of the situation.  She advised to call her and security if the daughter returns and continues to disrupt the unit.  Will continue to monitor.

## 2015-09-08 NOTE — Progress Notes (Signed)
Referring Physician(s): Dr Candiss Norse; Dr Collene Mares  Chief Complaint:  GI bleed  Subjective:  Post mesenteric arteriogram and percutaneous coil embolization of the GDA. Performed 09/07/15 in IR  No further bleeding Pt up in bed Restful; pleasant Moving about bed easily  Allergies: Review of patient's allergies indicates no known allergies.  Medications: Prior to Admission medications   Medication Sig Start Date End Date Taking? Authorizing Provider  ibuprofen (ADVIL,MOTRIN) 200 MG tablet Take 200 mg by mouth daily as needed for moderate pain.   Yes Historical Provider, MD  lisinopril-hydrochlorothiazide (PRINZIDE,ZESTORETIC) 10-12.5 MG per tablet Take 1 tablet by mouth daily. 08/21/13  Yes Lorayne Marek, MD  metFORMIN (GLUCOPHAGE XR) 500 MG 24 hr tablet Take 1 tablet (500 mg total) by mouth daily with breakfast. 08/18/13  Yes Lorayne Marek, MD  Vitamin D, Ergocalciferol, (DRISDOL) 50000 UNITS CAPS capsule Take 1 capsule (50,000 Units total) by mouth every 7 (seven) days. 08/18/13  Yes Lorayne Marek, MD  glucose monitoring kit (FREESTYLE) monitoring kit 1 each by Does not apply route 3 (three) times daily before meals. 07/17/13   Lorayne Marek, MD  nicotine (NICODERM CQ - DOSED IN MG/24 HOURS) 21 mg/24hr patch Place 1 patch (21 mg total) onto the skin daily. Patient not taking: Reported on 08/29/2015 07/17/13   Lorayne Marek, MD     Vital Signs: BP 128/93 mmHg  Pulse 79  Temp(Src) 98.5 F (36.9 C) (Oral)  Resp 21  Ht _0  (1.6 m)  Wt 250 lb (113.399 kg)  BMI 44.30 kg/m2  SpO2 95%  Physical Exam  Abdominal: Soft. Bowel sounds are normal.  Musculoskeletal: Normal range of motion.  Skin: Skin is warm and dry.  Rt groin NT, no bleeding No hematoma Rt foot 2+ pulses    Imaging: Ir Angiogram Visceral Selective  09/07/2015  INDICATION: History of duodenal ulcer now with acute upper GI bleed. Please perform mesenteric arteriogram and potential percutaneous coil embolization. EXAM:  1. ULTRASOUND GUIDANCE FOR ARTERIAL ACCESS. 2. SELECTIVE CELIAC AND SUPERIOR MESENTERIC ARTERIOGRAMS 3. SELECTED PROPER HEPATIC ARTERIOGRAM 4. SELECTIVE GASTRODUODENAL ARTERIOGRAM AND PERCUTANEOUS COIL EMBOLIZATION MEDICATIONS: None ANESTHESIA/SEDATION: Fentanyl 75 mcg IV; Versed 1.5 mg IV Moderate Sedation Time: 65 minutes; The patient was continuously monitored during the procedure by the interventional radiology nurse under my direct supervision. CONTRAST:  54m OMNIPAQUE IOHEXOL 300 MG/ML  SOLN FLUOROSCOPY TIME:  Fluoroscopy Time: 18 minutes 24 seconds (1595 mGy). COMPLICATIONS: SIR Level A - No therapy, no consequence. The procedure was complicated by non targeted embolization of a a 2 mm x 4 mm non fiber interlock coil within a cranial subsegmental branch vessel of the splenic artery. PROCEDURE: Informed consent was obtained from the patient following explanation of the procedure, risks, benefits and alternatives. The patient understands, agrees and consents for the procedure. All questions were addressed. A time out was performed prior to the initiation of the procedure. Maximal barrier sterile technique utilized including caps, mask, sterile gowns, sterile gloves, large sterile drape, hand hygiene, and Betadine prep. The right femoral head was marked fluoroscopically. Under ultrasound guidance, the right common femoral artery was accessed with a micropuncture kit after the overlying soft tissues were anesthetized with 1% lidocaine. An ultrasound image was saved for documentation purposes. The micropuncture sheath was exchanged for a 5 [place] sheath over a Bentson wire. A closure arteriogram was performed through the side of the sheath confirming access within the right common femoral artery. Over a Bentson wire, a Mickelson catheter was advanced to the  level of the thoracic aorta where it was back bled and flushed. The catheter was then utilized to select the celiac artery and a selective celiac  arteriogram was performed. With the use of a Fathom micro wire, a regular Renegade micro catheter was utilized to select the proper hepatic artery and a selective proper hepatic arteriogram was performed. The microcatheter was then utilized to select the gastroduodenal artery and a selective gastroduodenal arteriogram was performed. The micro catheter was advanced further into the distal aspect of the gastroduodenal artery. Following sub selective injection, a 2 mm x 4 mm non fibered interlock coil was attempted to be deployed within the distal aspect of the gastroduodenal artery however it became apparent that the micro catheter I was given was NOT a regular sized catheter but rather was a high-flow catheter. As such, coil became entrapped within the distal aspect of the micro catheter. With some manipulation, the coil was ultimately safely deployed within a cranial subsegmental branch of the splenic artery. The incorrect the microcatheter was discarded and a regular Renegade micro catheter was again utilized to select the gastroduodenal artery. Sub selective injection was performed and the gastroduodenal artery was percutaneously coil embolized with multiple overlapping 2 mm, 3 mm and 4 mm interlock fibered and non-fibered coils to near the vessel's origin. A completion proper hepatic arteriogram was performed. The micro catheter was removed and completion celiac and superior mesenteric arteriograms were performed. Images reviewed and the procedure was terminated. All wires, catheters and sheaths were removed from the patient. Hemostasis was achieved at the right groin access site with deployment of an Exoseal closure device. A dressing was placed. The patient tolerated the procedure well without immediate postprocedural complication. FINDINGS: Celiac arteriogram demonstrates a conventional branching pattern. Superior mesenteric arteriogram failed to delineate any retrograde contribution to the gastroduodenal  artery. Selective proper hepatic arteriogram demonstrates a markedly diminutive and irregular appearing gastroduodenal artery which was confirmed with selective gastroduodenal arteriogram. Ultimately successful percutaneous coil embolization of the gastroduodenal artery to near the vessels origin. Note, procedure was complicated by non target embolization of a cranial subsegmental branch vessel of the splenic artery. Completion proper hepatic arteriogram and demonstrates complete occlusion of the GDA. Completion celiac arteriogram demonstrates a hypertrophied gastroepiploic artery arising from the left gastric artery without definitive contribution to the duodenal bulb. IMPRESSION: 1. Technically successful prophylactic percutaneous coil embolization of the gastroduodenal artery for acute upper GI bleed. 2. Procedure complicated by non target embolization of a cranial subsegmental branch vessel of the splenic artery secondary to mismatch of catheter sizing. This non target coil is felt to be of no clinical significance. Above findings were discussed with Dr. Lala Lund , at the time of procedure completion. Electronically Signed   By: Sandi Mariscal M.D.   On: 09/07/2015 18:09   Ir Angiogram Visceral Selective  09/07/2015  INDICATION: History of duodenal ulcer now with acute upper GI bleed. Please perform mesenteric arteriogram and potential percutaneous coil embolization. EXAM: 1. ULTRASOUND GUIDANCE FOR ARTERIAL ACCESS. 2. SELECTIVE CELIAC AND SUPERIOR MESENTERIC ARTERIOGRAMS 3. SELECTED PROPER HEPATIC ARTERIOGRAM 4. SELECTIVE GASTRODUODENAL ARTERIOGRAM AND PERCUTANEOUS COIL EMBOLIZATION MEDICATIONS: None ANESTHESIA/SEDATION: Fentanyl 75 mcg IV; Versed 1.5 mg IV Moderate Sedation Time: 65 minutes; The patient was continuously monitored during the procedure by the interventional radiology nurse under my direct supervision. CONTRAST:  73m OMNIPAQUE IOHEXOL 300 MG/ML  SOLN FLUOROSCOPY TIME:  Fluoroscopy Time: 18  minutes 24 seconds (1595 mGy). COMPLICATIONS: SIR Level A - No therapy, no consequence. The procedure  was complicated by non targeted embolization of a a 2 mm x 4 mm non fiber interlock coil within a cranial subsegmental branch vessel of the splenic artery. PROCEDURE: Informed consent was obtained from the patient following explanation of the procedure, risks, benefits and alternatives. The patient understands, agrees and consents for the procedure. All questions were addressed. A time out was performed prior to the initiation of the procedure. Maximal barrier sterile technique utilized including caps, mask, sterile gowns, sterile gloves, large sterile drape, hand hygiene, and Betadine prep. The right femoral head was marked fluoroscopically. Under ultrasound guidance, the right common femoral artery was accessed with a micropuncture kit after the overlying soft tissues were anesthetized with 1% lidocaine. An ultrasound image was saved for documentation purposes. The micropuncture sheath was exchanged for a 5 [place] vascular sheath over a Bentson wire. A closure arteriogram was performed through the side of the sheath confirming access within the right common femoral artery. Over a Bentson wire, a Mickelson catheter was advanced to the level of the thoracic aorta where it was back bled and flushed. The catheter was then utilized to select the celiac artery and a selective celiac arteriogram was performed. With the use of a Fathom micro wire, a regular Renegade micro catheter was utilized to select the proper hepatic artery and a selective proper hepatic arteriogram was performed. The microcatheter was then utilized to select the gastroduodenal artery and a selective gastroduodenal arteriogram was performed. The micro catheter was advanced further into the distal aspect of the gastroduodenal artery. Following sub selective injection, a 2 mm x 4 mm non fibered interlock coil was attempted to be deployed within the  distal aspect of the gastroduodenal artery however it became apparent that the micro catheter I was given was NOT a regular sized catheter but rather was a high-flow catheter. As such, coil became entrapped within the distal aspect of the micro catheter. With some manipulation, the coil was ultimately safely deployed within a cranial subsegmental branch of the splenic artery. The incorrect the microcatheter was discarded and a regular Renegade micro catheter was again utilized to select the gastroduodenal artery. Sub selective injection was performed and the gastroduodenal artery was percutaneously coil embolized with multiple overlapping 2 mm, 3 mm and 4 mm interlock fibered and non-fibered coils to near the vessel's origin. A completion proper hepatic arteriogram was performed. The micro catheter was removed and completion celiac and superior mesenteric arteriograms were performed. Images reviewed and the procedure was terminated. All wires, catheters and sheaths were removed from the patient. Hemostasis was achieved at the right groin access site with deployment of an Exoseal closure device. A dressing was placed. The patient tolerated the procedure well without immediate postprocedural complication. FINDINGS: Celiac arteriogram demonstrates a conventional branching pattern. Superior mesenteric arteriogram failed to delineate any retrograde contribution to the gastroduodenal artery. Selective proper hepatic arteriogram demonstrates a markedly diminutive and irregular appearing gastroduodenal artery which was confirmed with selective gastroduodenal arteriogram. Ultimately successful percutaneous coil embolization of the gastroduodenal artery to near the vessels origin. Note, procedure was complicated by non target embolization of a cranial subsegmental branch vessel of the splenic artery. Completion proper hepatic arteriogram and demonstrates complete occlusion of the GDA. Completion celiac arteriogram  demonstrates a hypertrophied gastroepiploic artery arising from the left gastric artery without definitive contribution to the duodenal bulb. IMPRESSION: 1. Technically successful prophylactic percutaneous coil embolization of the gastroduodenal artery for acute upper GI bleed. 2. Procedure complicated by non target embolization of a cranial subsegmental  branch vessel of the splenic artery secondary to mismatch of catheter sizing. This non target coil is felt to be of no clinical significance. Above findings were discussed with Dr. Lala Lund , at the time of procedure completion. Electronically Signed   By: Sandi Mariscal M.D.   On: 09/07/2015 18:09   Ir Angiogram Selective Each Additional Vessel  09/07/2015  INDICATION: History of duodenal ulcer now with acute upper GI bleed. Please perform mesenteric arteriogram and potential percutaneous coil embolization. EXAM: 1. ULTRASOUND GUIDANCE FOR ARTERIAL ACCESS. 2. SELECTIVE CELIAC AND SUPERIOR MESENTERIC ARTERIOGRAMS 3. SELECTED PROPER HEPATIC ARTERIOGRAM 4. SELECTIVE GASTRODUODENAL ARTERIOGRAM AND PERCUTANEOUS COIL EMBOLIZATION MEDICATIONS: None ANESTHESIA/SEDATION: Fentanyl 75 mcg IV; Versed 1.5 mg IV Moderate Sedation Time: 65 minutes; The patient was continuously monitored during the procedure by the interventional radiology nurse under my direct supervision. CONTRAST:  61m OMNIPAQUE IOHEXOL 300 MG/ML  SOLN FLUOROSCOPY TIME:  Fluoroscopy Time: 18 minutes 24 seconds (1595 mGy). COMPLICATIONS: SIR Level A - No therapy, no consequence. The procedure was complicated by non targeted embolization of a a 2 mm x 4 mm non fiber interlock coil within a cranial subsegmental branch vessel of the splenic artery. PROCEDURE: Informed consent was obtained from the patient following explanation of the procedure, risks, benefits and alternatives. The patient understands, agrees and consents for the procedure. All questions were addressed. A time out was performed prior to the  initiation of the procedure. Maximal barrier sterile technique utilized including caps, mask, sterile gowns, sterile gloves, large sterile drape, hand hygiene, and Betadine prep. The right femoral head was marked fluoroscopically. Under ultrasound guidance, the right common femoral artery was accessed with a micropuncture kit after the overlying soft tissues were anesthetized with 1% lidocaine. An ultrasound image was saved for documentation purposes. The micropuncture sheath was exchanged for a 5 [place] sheath over a Bentson wire. A closure arteriogram was performed through the side of the sheath confirming access within the right common femoral artery. Over a Bentson wire, a Mickelson catheter was advanced to the level of the thoracic aorta where it was back bled and flushed. The catheter was then utilized to select the celiac artery and a selective celiac arteriogram was performed. With the use of a Fathom micro wire, a regular Renegade micro catheter was utilized to select the proper hepatic artery and a selective proper hepatic arteriogram was performed. The microcatheter was then utilized to select the gastroduodenal artery and a selective gastroduodenal arteriogram was performed. The micro catheter was advanced further into the distal aspect of the gastroduodenal artery. Following sub selective injection, a 2 mm x 4 mm non fibered interlock coil was attempted to be deployed within the distal aspect of the gastroduodenal artery however it became apparent that the micro catheter I was given was NOT a regular sized catheter but rather was a high-flow catheter. As such, coil became entrapped within the distal aspect of the micro catheter. With some manipulation, the coil was ultimately safely deployed within a cranial subsegmental branch of the splenic artery. The incorrect the microcatheter was discarded and a regular Renegade micro catheter was again utilized to select the gastroduodenal artery. Sub  selective injection was performed and the gastroduodenal artery was percutaneously coil embolized with multiple overlapping 2 mm, 3 mm and 4 mm interlock fibered and non-fibered coils to near the vessel's origin. A completion proper hepatic arteriogram was performed. The micro catheter was removed and completion celiac and superior mesenteric arteriograms were performed. Images reviewed and the procedure  was terminated. All wires, catheters and sheaths were removed from the patient. Hemostasis was achieved at the right groin access site with deployment of an Exoseal closure device. A dressing was placed. The patient tolerated the procedure well without immediate postprocedural complication. FINDINGS: Celiac arteriogram demonstrates a conventional branching pattern. Superior mesenteric arteriogram failed to delineate any retrograde contribution to the gastroduodenal artery. Selective proper hepatic arteriogram demonstrates a markedly diminutive and irregular appearing gastroduodenal artery which was confirmed with selective gastroduodenal arteriogram. Ultimately successful percutaneous coil embolization of the gastroduodenal artery to near the vessels origin. Note, procedure was complicated by non target embolization of a cranial subsegmental branch vessel of the splenic artery. Completion proper hepatic arteriogram and demonstrates complete occlusion of the GDA. Completion celiac arteriogram demonstrates a hypertrophied gastroepiploic artery arising from the left gastric artery without definitive contribution to the duodenal bulb. IMPRESSION: 1. Technically successful prophylactic percutaneous coil embolization of the gastroduodenal artery for acute upper GI bleed. 2. Procedure complicated by non target embolization of a cranial subsegmental branch vessel of the splenic artery secondary to mismatch of catheter sizing. This non target coil is felt to be of no clinical significance. Above findings were discussed with  Dr. Lala Lund , at the time of procedure completion. Electronically Signed   By: Sandi Mariscal M.D.   On: 09/07/2015 18:09   Ir Angiogram Follow Up Study  09/07/2015  INDICATION: History of duodenal ulcer now with acute upper GI bleed. Please perform mesenteric arteriogram and potential percutaneous coil embolization. EXAM: 1. ULTRASOUND GUIDANCE FOR ARTERIAL ACCESS. 2. SELECTIVE CELIAC AND SUPERIOR MESENTERIC ARTERIOGRAMS 3. SELECTED PROPER HEPATIC ARTERIOGRAM 4. SELECTIVE GASTRODUODENAL ARTERIOGRAM AND PERCUTANEOUS COIL EMBOLIZATION MEDICATIONS: None ANESTHESIA/SEDATION: Fentanyl 75 mcg IV; Versed 1.5 mg IV Moderate Sedation Time: 65 minutes; The patient was continuously monitored during the procedure by the interventional radiology nurse under my direct supervision. CONTRAST:  20m OMNIPAQUE IOHEXOL 300 MG/ML  SOLN FLUOROSCOPY TIME:  Fluoroscopy Time: 18 minutes 24 seconds (1595 mGy). COMPLICATIONS: SIR Level A - No therapy, no consequence. The procedure was complicated by non targeted embolization of a a 2 mm x 4 mm non fiber interlock coil within a cranial subsegmental branch vessel of the splenic artery. PROCEDURE: Informed consent was obtained from the patient following explanation of the procedure, risks, benefits and alternatives. The patient understands, agrees and consents for the procedure. All questions were addressed. A time out was performed prior to the initiation of the procedure. Maximal barrier sterile technique utilized including caps, mask, sterile gowns, sterile gloves, large sterile drape, hand hygiene, and Betadine prep. The right femoral head was marked fluoroscopically. Under ultrasound guidance, the right common femoral artery was accessed with a micropuncture kit after the overlying soft tissues were anesthetized with 1% lidocaine. An ultrasound image was saved for documentation purposes. The micropuncture sheath was exchanged for a 5 [place] sheath over a Bentson wire. A  closure arteriogram was performed through the side of the sheath confirming access within the right common femoral artery. Over a Bentson wire, a Mickelson catheter was advanced to the level of the thoracic aorta where it was back bled and flushed. The catheter was then utilized to select the celiac artery and a selective celiac arteriogram was performed. With the use of a Fathom micro wire, a regular Renegade micro catheter was utilized to select the proper hepatic artery and a selective proper hepatic arteriogram was performed. The microcatheter was then utilized to select the gastroduodenal artery and a selective gastroduodenal arteriogram was performed.  The micro catheter was advanced further into the distal aspect of the gastroduodenal artery. Following sub selective injection, a 2 mm x 4 mm non fibered interlock coil was attempted to be deployed within the distal aspect of the gastroduodenal artery however it became apparent that the micro catheter I was given was NOT a regular sized catheter but rather was a high-flow catheter. As such, coil became entrapped within the distal aspect of the micro catheter. With some manipulation, the coil was ultimately safely deployed within a cranial subsegmental branch of the splenic artery. The incorrect the microcatheter was discarded and a regular Renegade micro catheter was again utilized to select the gastroduodenal artery. Sub selective injection was performed and the gastroduodenal artery was percutaneously coil embolized with multiple overlapping 2 mm, 3 mm and 4 mm interlock fibered and non-fibered coils to near the vessel's origin. A completion proper hepatic arteriogram was performed. The micro catheter was removed and completion celiac and superior mesenteric arteriograms were performed. Images reviewed and the procedure was terminated. All wires, catheters and sheaths were removed from the patient. Hemostasis was achieved at the right groin access site with  deployment of an Exoseal closure device. A dressing was placed. The patient tolerated the procedure well without immediate postprocedural complication. FINDINGS: Celiac arteriogram demonstrates a conventional branching pattern. Superior mesenteric arteriogram failed to delineate any retrograde contribution to the gastroduodenal artery. Selective proper hepatic arteriogram demonstrates a markedly diminutive and irregular appearing gastroduodenal artery which was confirmed with selective gastroduodenal arteriogram. Ultimately successful percutaneous coil embolization of the gastroduodenal artery to near the vessels origin. Note, procedure was complicated by non target embolization of a cranial subsegmental branch vessel of the splenic artery. Completion proper hepatic arteriogram and demonstrates complete occlusion of the GDA. Completion celiac arteriogram demonstrates a hypertrophied gastroepiploic artery arising from the left gastric artery without definitive contribution to the duodenal bulb. IMPRESSION: 1. Technically successful prophylactic percutaneous coil embolization of the gastroduodenal artery for acute upper GI bleed. 2. Procedure complicated by non target embolization of a cranial subsegmental branch vessel of the splenic artery secondary to mismatch of catheter sizing. This non target coil is felt to be of no clinical significance. Above findings were discussed with Dr. Lala Lund , at the time of procedure completion. Electronically Signed   By: Sandi Mariscal M.D.   On: 09/07/2015 18:09   Ir US Guide Vasc Access Right  09/07/2015  INDICATION: History of duodenal ulcer now with acute upper GI bleed. Please perform mesenteric arteriogram and potential percutaneous coil embolization. EXAM: 1. ULTRASOUND GUIDANCE FOR ARTERIAL ACCESS. 2. SELECTIVE CELIAC AND SUPERIOR MESENTERIC ARTERIOGRAMS 3. SELECTED PROPER HEPATIC ARTERIOGRAM 4. SELECTIVE GASTRODUODENAL ARTERIOGRAM AND PERCUTANEOUS COIL EMBOLIZATION  MEDICATIONS: None ANESTHESIA/SEDATION: Fentanyl 75 mcg IV; Versed 1.5 mg IV Moderate Sedation Time: 65 minutes; The patient was continuously monitored during the procedure by the interventional radiology nurse under my direct supervision. CONTRAST:  65m OMNIPAQUE IOHEXOL 300 MG/ML  SOLN FLUOROSCOPY TIME:  Fluoroscopy Time: 18 minutes 24 seconds (1595 mGy). COMPLICATIONS: SIR Level A - No therapy, no consequence. The procedure was complicated by non targeted embolization of a a 2 mm x 4 mm non fiber interlock coil within a cranial subsegmental branch vessel of the splenic artery. PROCEDURE: Informed consent was obtained from the patient following explanation of the procedure, risks, benefits and alternatives. The patient understands, agrees and consents for the procedure. All questions were addressed. A time out was performed prior to the initiation of the procedure. Maximal barrier sterile technique  utilized including caps, mask, sterile gowns, sterile gloves, large sterile drape, hand hygiene, and Betadine prep. The right femoral head was marked fluoroscopically. Under ultrasound guidance, the right common femoral artery was accessed with a micropuncture kit after the overlying soft tissues were anesthetized with 1% lidocaine. An ultrasound image was saved for documentation purposes. The micropuncture sheath was exchanged for a 5 [place] vascular sheath over a Bentson wire. A closure arteriogram was performed through the side of the sheath confirming access within the right common femoral artery. Over a Bentson wire, a Mickelson catheter was advanced to the level of the thoracic aorta where it was back bled and flushed. The catheter was then utilized to select the celiac artery and a selective celiac arteriogram was performed. With the use of a Fathom micro wire, a regular Renegade micro catheter was utilized to select the proper hepatic artery and a selective proper hepatic arteriogram was performed. The  microcatheter was then utilized to select the gastroduodenal artery and a selective gastroduodenal arteriogram was performed. The micro catheter was advanced further into the distal aspect of the gastroduodenal artery. Following sub selective injection, a 2 mm x 4 mm non fibered interlock coil was attempted to be deployed within the distal aspect of the gastroduodenal artery however it became apparent that the micro catheter I was given was NOT a regular sized catheter but rather was a high-flow catheter. As such, coil became entrapped within the distal aspect of the micro catheter. With some manipulation, the coil was ultimately safely deployed within a cranial subsegmental branch of the splenic artery. The incorrect the microcatheter was discarded and a regular Renegade micro catheter was again utilized to select the gastroduodenal artery. Sub selective injection was performed and the gastroduodenal artery was percutaneously coil embolized with multiple overlapping 2 mm, 3 mm and 4 mm interlock fibered and non-fibered coils to near the vessel's origin. A completion proper hepatic arteriogram was performed. The micro catheter was removed and completion celiac and superior mesenteric arteriograms were performed. Images reviewed and the procedure was terminated. All wires, catheters and sheaths were removed from the patient. Hemostasis was achieved at the right groin access site with deployment of an Exoseal closure device. A dressing was placed. The patient tolerated the procedure well without immediate postprocedural complication. FINDINGS: Celiac arteriogram demonstrates a conventional branching pattern. Superior mesenteric arteriogram failed to delineate any retrograde contribution to the gastroduodenal artery. Selective proper hepatic arteriogram demonstrates a markedly diminutive and irregular appearing gastroduodenal artery which was confirmed with selective gastroduodenal arteriogram. Ultimately successful  percutaneous coil embolization of the gastroduodenal artery to near the vessels origin. Note, procedure was complicated by non target embolization of a cranial subsegmental branch vessel of the splenic artery. Completion proper hepatic arteriogram and demonstrates complete occlusion of the GDA. Completion celiac arteriogram demonstrates a hypertrophied gastroepiploic artery arising from the left gastric artery without definitive contribution to the duodenal bulb. IMPRESSION: 1. Technically successful prophylactic percutaneous coil embolization of the gastroduodenal artery for acute upper GI bleed. 2. Procedure complicated by non target embolization of a cranial subsegmental branch vessel of the splenic artery secondary to mismatch of catheter sizing. This non target coil is felt to be of no clinical significance. Above findings were discussed with Dr. Lala Lund , at the time of procedure completion. Electronically Signed   By: Sandi Mariscal M.D.   On: 09/07/2015 18:09   Dg Chest Port 1 View  09/04/2015  CLINICAL DATA:  Central line placement. EXAM: PORTABLE CHEST 1 VIEW  COMPARISON:  08/29/2015 FINDINGS: Endotracheal tube terminates 6 cm above the carina. Right internal jugular approach central venous catheter seen with tip at the expected location of superior vena cava. Cardiomediastinal silhouette is enlarged, likely exaggerated by portable technique and rotation. Mediastinal contours appear intact. There is no evidence of focal airspace consolidation, pleural effusion or pneumothorax. Lung volumes are low. Osseous structures are without acute abnormality. Soft tissues are grossly normal. IMPRESSION: Low lung volumes. Endotracheal tube 6 cm above the carina. Advancement with 2 cm may be considered. Right-sided central venous catheter in satisfactory position radiographically. Electronically Signed   By: Fidela Salisbury M.D.   On: 09/04/2015 16:06   [place] Guide  Roadmapping  09/07/2015  INDICATION: History of duodenal ulcer now with acute upper GI bleed. Please perform mesenteric arteriogram and potential percutaneous coil embolization. EXAM: 1. ULTRASOUND GUIDANCE FOR ARTERIAL ACCESS. 2. SELECTIVE CELIAC AND SUPERIOR MESENTERIC ARTERIOGRAMS 3. SELECTED PROPER HEPATIC ARTERIOGRAM 4. SELECTIVE GASTRODUODENAL ARTERIOGRAM AND PERCUTANEOUS COIL EMBOLIZATION MEDICATIONS: None ANESTHESIA/SEDATION: Fentanyl 75 mcg IV; Versed 1.5 mg IV Moderate Sedation Time: 65 minutes; The patient was continuously monitored during the procedure by the interventional radiology nurse under my direct supervision. CONTRAST:  42m OMNIPAQUE IOHEXOL 300 MG/ML  SOLN FLUOROSCOPY TIME:  Fluoroscopy Time: 18 minutes 24 seconds (1595 mGy). COMPLICATIONS: SIR Level A - No therapy, no consequence. The procedure was complicated by non targeted embolization of a a 2 mm x 4 mm non fiber interlock coil within a cranial subsegmental branch vessel of the splenic artery. PROCEDURE: Informed consent was obtained from the patient following explanation of the procedure, risks, benefits and alternatives. The patient understands, agrees and consents for the procedure. All questions were addressed. A time out was performed prior to the initiation of the procedure. Maximal barrier sterile technique utilized including caps, mask, sterile gowns, sterile gloves, large sterile drape, hand hygiene, and Betadine prep. The right femoral head was marked fluoroscopically. Under ultrasound guidance, the right common femoral artery was accessed with a micropuncture kit after the overlying soft tissues were anesthetized with 1% lidocaine. An ultrasound image was saved for documentation purposes. The micropuncture sheath was exchanged for a 5 [place] sheath over a Bentson wire. A closure arteriogram was performed through the side of the sheath confirming access within the right common femoral artery. Over a Bentson wire, a  Mickelson catheter was advanced to the level of the thoracic aorta where it was back bled and flushed. The catheter was then utilized to select the celiac artery and a selective celiac arteriogram was performed. With the use of a Fathom micro wire, a regular Renegade micro catheter was utilized to select the proper hepatic artery and a selective proper hepatic arteriogram was performed. The microcatheter was then utilized to select the gastroduodenal artery and a selective gastroduodenal arteriogram was performed. The micro catheter was advanced further into the distal aspect of the gastroduodenal artery. Following sub selective injection, a 2 mm x 4 mm non fibered interlock coil was attempted to be deployed within the distal aspect of the gastroduodenal artery however it became apparent that the micro catheter I was given was NOT a regular sized catheter but rather was a high-flow catheter. As such, coil became entrapped within the distal aspect of the micro catheter. With some manipulation, the coil was ultimately safely deployed within a cranial subsegmental branch of the splenic artery. The incorrect the microcatheter was discarded and a regular Renegade micro catheter was again  utilized to select the gastroduodenal artery. Sub selective injection was performed and the gastroduodenal artery was percutaneously coil embolized with multiple overlapping 2 mm, 3 mm and 4 mm interlock fibered and non-fibered coils to near the vessel's origin. A completion proper hepatic arteriogram was performed. The micro catheter was removed and completion celiac and superior mesenteric arteriograms were performed. Images reviewed and the procedure was terminated. All wires, catheters and sheaths were removed from the patient. Hemostasis was achieved at the right groin access site with deployment of an Exoseal closure device. A dressing was placed. The patient tolerated the procedure well without immediate postprocedural  complication. FINDINGS: Celiac arteriogram demonstrates a conventional branching pattern. Superior mesenteric arteriogram failed to delineate any retrograde contribution to the gastroduodenal artery. Selective proper hepatic arteriogram demonstrates a markedly diminutive and irregular appearing gastroduodenal artery which was confirmed with selective gastroduodenal arteriogram. Ultimately successful percutaneous coil embolization of the gastroduodenal artery to near the vessels origin. Note, procedure was complicated by non target embolization of a cranial subsegmental branch vessel of the splenic artery. Completion proper hepatic arteriogram and demonstrates complete occlusion of the GDA. Completion celiac arteriogram demonstrates a hypertrophied gastroepiploic artery arising from the left gastric artery without definitive contribution to the duodenal bulb. IMPRESSION: 1. Technically successful prophylactic percutaneous coil embolization of the gastroduodenal artery for acute upper GI bleed. 2. Procedure complicated by non target embolization of a cranial subsegmental branch vessel of the splenic artery secondary to mismatch of catheter sizing. This non target coil is felt to be of no clinical significance. Above findings were discussed with Dr. Lala Lund , at the time of procedure completion. Electronically Signed   By: Sandi Mariscal M.D.   On: 09/07/2015 18:09    Labs:  CBC:  Recent Labs  09/05/15 2202 09/06/15 0550  09/07/15 0500 09/07/15 1300 09/07/15 1915 09/08/15 0040 09/08/15 0655  WBC 14.9* 12.9*  --  12.2*  --   --   --  13.3*  HGB 9.2* 8.2*  < > 8.3* 8.7* 8.4* 8.6* 8.3*  HCT 27.5* 24.6*  < > 24.8* 25.6* 25.8* 26.6* 25.6*  PLT 113* 107*  --  100*  --   --   --  114*  < > = values in this interval not displayed.  COAGS:  Recent Labs  09/04/15 1504  INR 1.34  APTT 26    BMP:  Recent Labs  09/05/15 0350 09/06/15 0549 09/07/15 0500 09/08/15 0500  NA 145 142 136 139  K  3.8 3.3* 4.1 3.5  CL 115* 108 96* 104  CO2 _0 GLUCOSE 127* 135* 433* 147*  BUN 21* _1 CALCIUM 7.1* 7.0* 7.2* 7.7*  CREATININE 0.96 0.93 0.82 0.71  GFRNONAA >60 >60 >60 >60  GFRAA >60 >60 >60 >60    LIVER FUNCTION TESTS:  Recent Labs  08/29/15 1446 08/31/15 1028 09/06/15 0549 09/08/15 0500  BILITOT 0.4 0.6 0.3 0.2*  AST 13* 13* 13* 15  ALT 13* 12* 9* 12*  ALKPHOS 88 70 48 61  PROT 7.3 6.0* 3.9* 4.8*  ALBUMIN 2.9* 2.5* 1.5* 1.7*    Assessment and Plan:  GI bleed GDA embolization 1/25 in IR Doing well  Electronically Signed: Antone Summons A 09/08/2015, 12:18 PM   I spent a total of 15 Minutes at the the patient's bedside AND on the patient's hospital floor or unit, greater than 50% of which was counseling/coordinating care for GDA embolization

## 2015-09-09 DIAGNOSIS — K2901 Acute gastritis with bleeding: Secondary | ICD-10-CM

## 2015-09-09 DIAGNOSIS — Z515 Encounter for palliative care: Secondary | ICD-10-CM

## 2015-09-09 LAB — BASIC METABOLIC PANEL
ANION GAP: 6 (ref 5–15)
BUN: 10 mg/dL (ref 6–20)
CALCIUM: 7.7 mg/dL — AB (ref 8.9–10.3)
CO2: 28 mmol/L (ref 22–32)
Chloride: 105 mmol/L (ref 101–111)
Creatinine, Ser: 0.62 mg/dL (ref 0.44–1.00)
GFR calc Af Amer: >60 mL/min (ref >60)
GFR calc non Af Amer: >60 mL/min (ref >60)
GLUCOSE: 134 mg/dL — AB (ref 65–99)
POTASSIUM: 3.6 mmol/L (ref 3.5–5.1)
SODIUM: 139 mmol/L (ref 135–145)

## 2015-09-09 LAB — GLUCOSE, CAPILLARY
GLUCOSE-CAPILLARY: 141 mg/dL — AB (ref 65–99)
GLUCOSE-CAPILLARY: 150 mg/dL — AB (ref 65–99)
Glucose-Capillary: 120 mg/dL — ABNORMAL HIGH (ref 65–99)
Glucose-Capillary: 122 mg/dL — ABNORMAL HIGH (ref 65–99)
Glucose-Capillary: 159 mg/dL — ABNORMAL HIGH (ref 65–99)

## 2015-09-09 LAB — CBC
HCT: 24.6 % — ABNORMAL LOW (ref 36.0–46.0)
Hemoglobin: 8.2 g/dL — ABNORMAL LOW (ref 12.0–15.0)
MCH: 29.1 pg (ref 26.0–34.0)
MCHC: 33.3 g/dL (ref 30.0–36.0)
MCV: 87.2 fL (ref 78.0–100.0)
PLATELETS: 142 10*3/uL — AB (ref 150–400)
RBC: 2.82 MIL/uL — AB (ref 3.87–5.11)
RDW: 15 % (ref 11.5–15.5)
WBC: 12.7 10*3/uL — AB (ref 4.0–10.5)

## 2015-09-09 LAB — MAGNESIUM: Magnesium: 1.8 mg/dL (ref 1.7–2.4)

## 2015-09-09 MED ORDER — SODIUM CHLORIDE 0.9% FLUSH
10.0000 mL | INTRAVENOUS | Status: DC | PRN
  Administered 2015-09-09 – 2015-09-12 (×3): 10 mL
  Administered 2015-09-13: 20 mL
  Filled 2015-09-09 (×3): qty 40

## 2015-09-09 MED ORDER — TRACE MINERALS CR-CU-MN-SE-ZN 10-1000-500-60 MCG/ML IV SOLN
INTRAVENOUS | Status: AC
  Administered 2015-09-09: 17:00:00 via INTRAVENOUS
  Filled 2015-09-09: qty 1992

## 2015-09-09 MED ORDER — FAT EMULSION 20 % IV EMUL
240.0000 mL | INTRAVENOUS | Status: AC
  Administered 2015-09-09: 240 mL via INTRAVENOUS
  Filled 2015-09-09: qty 250

## 2015-09-09 NOTE — Progress Notes (Addendum)
Physical Therapy Treatment Patient Details Name: Ashley Ramsey MRN: [id number] DOB: 10-Sep-1955 Today's Date: October 03, 2015    History of Present Illness 60yo female with hx HTN, DM who initially presented 1/16 with hoarseness and difficulty swallowing. CT neck revealed laryngeal mass and CT abd revealed renal mass. She was seen in consultation by ENT and underwent tracheostomy placement 1/22. Course was c/b GI bleed. EGD performed on 1/22 revealed large duodenal ulcer which was actively bleeding and was clipped. Has laryngeal and renal Cancer.    PT Comments    Pt making good progress with mobility.  Follow Up Recommendations  Home health PT     Equipment Recommendations  Other (comment) (rollator)    Recommendations for Other Services       Precautions / Restrictions Precautions Precautions: Fall Restrictions Weight Bearing Restrictions: No    Mobility  Bed Mobility Overal bed mobility: Needs Assistance Bed Mobility: Supine to Sit     Supine to sit: Supervision     General bed mobility comments: incr time for pt to come to EOB.  Transfers Overall transfer level: Needs assistance Equipment used: Rolling walker (2 wheeled) Transfers: Sit to/from [facility] Sit to Stand: Min guard Stand pivot transfers: Min guard       General transfer comment: Assist to steady  Ambulation/Gait Ambulation/Gait assistance: Min guard Ambulation Distance (Feet): 220 Feet Assistive device: Rolling walker (2 wheeled) Gait Pattern/deviations: Step-through pattern;Decreased stride length   Gait velocity interpretation: Below normal speed for age/gender General Gait Details: Walker to steady. Amb on trach collar with O2.   Stairs            Wheelchair Mobility    Modified Rankin (Stroke Patients Only)       Balance Overall balance assessment: Needs assistance Sitting-balance support: No upper extremity supported;Feet supported Sitting balance-Leahy  Scale: Good     Standing balance support: Bilateral upper extremity supported Standing balance-Leahy Scale: Poor Standing balance comment: support of walker                    Cognition Arousal/Alertness: Awake/alert Behavior During Therapy: WFL for tasks assessed/performed Overall Cognitive Status: Within Functional Limits for tasks assessed                      Exercises      General Comments        Pertinent Vitals/Pain Pain Assessment: No/denies pain    Home Living                      Prior Function            PT Goals (current goals can now be found in the care plan section) Progress towards PT goals: Progressing toward goals    Frequency  Min 3X/week    PT Plan Discharge plan needs to be updated    Co-evaluation             End of Session Equipment Utilized During Treatment: Oxygen Activity Tolerance: Patient tolerated treatment well Patient left: in chair;with call bell/phone within reach;with chair alarm set;with family/visitor present     Time: [id number] PT Time Calculation (min) (ACUTE ONLY): 21 min  Charges:  $Gait Training: 8-22 mins                    G Codes:      Raymonde Hamblin October 03, 2015, 1:35 PM [employer] PT [phone]

## 2015-09-09 NOTE — Progress Notes (Signed)
CSW consulted regarding guardianship paperwork for patient's sister. CSW spoke w/ patient's sister and nephew at bedside. CSW explained that per Dr. Candiss Norse, patient would have to get a note from her PCP that states she is not medically able to take care of her sister anymore. Family expressed understanding. CSW also explained how to submit Medicaid application.   CSW signing off.  Percell Locus Placido Hangartner LCSWA [phone]

## 2015-09-09 NOTE — Progress Notes (Signed)
Text page to attending Dr Candiss Norse to update on dispo plan for home, home trach care available starting Monday.

## 2015-09-09 NOTE — Progress Notes (Signed)
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   No Known Allergies  Patient Measurements: Height: 5\' 3"  (160 cm) Weight: 250 lb (113.399 kg) IBW/kg (Calculated) : 52.4 Adjusted Body Weight: 67 kg  Usual Weight: 113.4 BMI = 44   Vital Signs: Temp: 98.1 F (36.7 C) (01/27 0618) Temp Source: Oral (01/27 0618) BP: 134/84 mmHg (01/27 0622) Pulse Rate: 87 (01/27 0622) Intake/Output from previous day: 01/26 0701 - 01/27 0700 In: 4141.2 [I.V.:776; IV Piggyback:150; TPN:3215.2] Out: 2375 [Urine:2375] Intake/Output from this shift: Total I/O In: -  Out: 399 [Urine:399]  Labs:  Recent Labs  09/07/15 0500  09/08/15 0040 09/08/15 0655 09/09/15 0430  WBC 12.2*  --   --  13.3* 12.7*  HGB 8.3*  < > 8.6* 8.3* 8.2*  HCT 24.8*  < > 26.6* 25.6* 24.6*  PLT 100*  --   --  114* 142*  < > = values in this interval not displayed.   Recent Labs  09/07/15 0500 09/08/15 0500 09/09/15 0430  NA 136 139 139  K 4.1 3.5 3.6  CL 96* 104 105  CO2 23 28 28   GLUCOSE 433* 147* 134*  BUN 13 9 10   CREATININE 0.82 0.71 0.62  CALCIUM 7.2* 7.7* 7.7*  MG 1.9 1.6* 1.8  PHOS  --  3.4  --   PROT  --  4.8*  --   ALBUMIN  --  1.7*  --   AST  --  15  --   ALT  --  12*  --   ALKPHOS  --  61  --   BILITOT  --  0.2*  --    Estimated Creatinine Clearance: 91.8 mL/min (by C-G formula based on Cr of 0.62).    Recent Labs  09/08/15 1638 09/09/15 0014 09/09/15 0614  GLUCAP 136* 159* 141*    Insulin Requirements in the past 24 hours:  5 units SSI   Assessment: 83 YOF who presented with worsening hoarseness of voice and abdominal pain. She had difficulty swallowing foods, N/V, weight loss and no appetite. CT neck found laryngeal mass and CT abdmonen revealed a left renal mass. She was also found to have a UTI with SIRS. Admitted for IV antibiotics. Underwent a biopsy by ENT on 1/20. Hospital course also complicated  by development of lower GI bleeding and associated blood loss anemia   Surgeries/Procedures:  GI: S/p upper GI bleed on 09/04/15 requiring multiple units of PRBC. GI/ENT consulted. Tracheostomy placement by ENT on 1/23. She will be nothing by mouth for a while and PEG tube not possible due to likely stomach and duodenal ulcers. On PPI drip. Prealbumin 8.7 09/07/15: gastroduodenal artery was coiled/embolized by IR. No further bleeding since Endo: H/o DM, CBGs 136-142. On SSI  Lytes: K 3.6, CorrCa 9.5, Mg 1.8, Phos 3.4 Renal: SCr 0.62,  LR @ 10 mL/hr  UOP 0.9 ml/kg/hr  Pulm: Trach collar; 28% FiO2 on 5L  Cards: H/o HTN,  Hepatobil: LFTs not elevated  Neuro: Probable head and neck cancer. Per oncology, she is poor candidate for chemotherapy and most likely has renal cancer. Prognosis is extremely. Oncology recommends palliative radiation treatments and palliative care consult. On folic acid and thiamine  Heme: Hgb down to 8.2, Plt 142k ID: WBC 13.3>>12.7 ceftriaxone for E.coli UTI 1/17>>1/24 Best Practices: PPI, SCDs  TPN Access: PICC line 1/23>> TPN start date: 1/23>>   Current Nutrition:  Clinimix-E 5/15 83 mL/hr  and 20% IVFE @ 10 mL/hr - at goal   Nutritional Goals:  Kcal: 1700-2000 Protein: 105-120 gm Fluid: 1.7-2 L  Plan:  - Continue Clinimix-E 5/15 to  25mL/hr and 20% IVFE @ 10 mL/hr. This will provide ~ 100 gm of protein (~ 95% of goal) and 1894  kCal (100% of goal).  - Monitor CBGs as TPN is advanced. No insulin in TPN - Continue MVI and TE in TPN bag  - IV folic acid and thiamine added to TPN daily - Monitor for s/s of refeeding   Albertina Parr, PharmD., BCPS Clinical Pharmacist Phone [phone]

## 2015-09-09 NOTE — Progress Notes (Signed)
PATIENT DETAILS Name: Ashley Ramsey Age: 60 y.o. Sex: female Date of Birth: March 21, 1956 Admit Date: 08/29/2015 Admitting Physician [name], MD [name], [name], MD  Brief narrative:  60 year old female with past medical history of hypertension, type 2 diabetes presented to the ED with worsening hoarseness of voice and abdominal pain. Upon further evaluation with a CT neck she was found to have a laryngeal mask, CT abdomen revealed a left renal mass. She was also found to have UTI with SIRS. She was admitted and started on IV antibiotics, ENT was consulted-patient underwent a biopsy on 1/20. Hospital course has been complicated by development of lower GI bleeding and associated blood loss anemia.   She had an episode of hematemesis on 09/04/2015 after with GI was reconsulted, ENT was called as well, she is post tracheostomy on 09/04/2015.  Subjective:  Patient in bed, currently no chest abdominal pain, no shortness of breath. Feels better. No subjective complaints this morning.  Assessment/Plan:   Metastatic Invasive Sq Cell Laryngeal CA : ENT & oncology consulted, underwent laryngoscopy with biopsy on 1/20 confirming the malignancy.   She underwent tracheostomy placement by ENT on 09/05/2015 to secure her airway. Trach care and speech following along with ENT Dr Constance Holster.  Oncology consulted, I had detailed discussions with oncologist Dr. Alvy Bimler on 09/04/2015. Patient's prognosis is extremely poor, poor candidate for chemotherapy, also most likely has renal cancer. She recommends palliative radiation treatments and consultation with palliative care. Discussed with ENT Dr Constance Holster on 09/08/2015. Outpatient follow-up with [facility] ENT once acute issues have resolved per ENT may be in the next 1-2 weeks.  No PEG tube as she has possible stomach and duodenal ulcers. Now has a PICC line for TNA. Speech to reevaluate For both PMV and swallow eval. Continue with  speech and ENT on 09/07/2015.   Acute blood loss related anemia due to UGI bleed. 5 units of packed RBC transfusion on 09/04/2015, H&H now stable, GI bleed due to likely proximal stomach and duodenal ulcer, status post EGD on 09/04/2015, on 09-07-15 @ 1pm - 1 more dark stool, continue IV PPI continue, GI informed, IR called, her Gastroduodenal Artery was coiled/embolized by IR on 09-07-15, H&H stable, transfuse if HB<7.5.   Left renal mass: Highly suspicious for renal cell carcinoma. Doubt it is related to above. Spoke with Dr. Ree Kida MD on 1/17,he reviewed patient's CT scan in chart - recommends outpatient follow-up with him in the office for consideration of nephrectomy in the future.  However patient will have a laryngeal mass addressed first with palliative radiation treatments, currently too weak for chemotherapy and nephrectomy.    Dysphagia: Secondary to known laryngeal mass. Speech therapy following-completed MBS-recommendations are for dysphagia 1 diet. Await arrival of family-will discuss with patient/family regarding risks of aspiration. She will most likely may require PEG tube in the future once GI Ulcers are better. DW Dr Collene Mares GI 09-07-15.   E coli pyelonephritis with SIRS: Sensitivities noted, finished Rocephin.  Afebrile.  Acute renal failure: Likely prerenal azotemia, resolved after hydration.  Hypertension: Controlled on PRN Hydralazine.   Tobacco & Alcohol abuse: Counseled, completed Ativan per CIWA protocol-no signs of withdrawal  Left adrenal gland nodule: Will need outpatient follow-up, especially given left adrenal mass.  Subpleural nodules in the right middle lobe of right lung and left lower lobe of left lung: Will need to repeat CT chest in 6 months. Stable on  room air, no shortness of breath  Possible CAD: Atherosclerosis seen CT chest. Unfortunately with ongoing lower GI bleeding-unable to use antiplatelets. Echo with LFEV 14-48%, grade 1 diastolic dysfunction,  no wall motion abnormalities. Given numerous above medical problems-not a candidate for any procedures/further workup at this point.  Morbid obesity: Has lost significant amount of weight over the past few months.  Type 2 diabetes: Controlled with CBGs 110-130s. Continue SSI.  CBG (last 3)   Recent Labs  09/09/15 0014 09/09/15 0614 09/09/15 0830  GLUCAP 159* 141* 150*     Disposition: Remains inpatient-Home in next few days  Antimicrobial agents  See below  Anti-infectives    Start     Dose/Rate Route Frequency Ordered Stop   08/29/15 2330  cefTRIAXone (ROCEPHIN) 1 g in dextrose 5 % 50 mL IVPB  Status:  Discontinued     1 g 100 mL/hr over 30 Minutes Intravenous Every 24 hours 08/29/15 2250 09/07/15 1332      DVT Prophylaxis: SCD's, no pharmacological DVT ppx given GI bleed/anemia  Code Status: Full code   Family Communication Daughter  Procedures:  ECHO- TTE  - Left ventricle: The cavity size was normal. Wall thickness was normal. Systolic function was normal. The estimated ejectionfraction was in the range of 55% to 60%. Wall motion was normal;there were no regional wall motion abnormalities. Dopplerparameters are consistent with abnormal left ventricularrelaxation (grade 1 diastolic dysfunction). Doppler parametersare consistent with high ventricular filling pressure. - Aortic valve: There was very mild stenosis. There was trivialregurgitation. - Mitral valve: Calcified annulus. Mildly thickened leaflets . - Left atrium: The atrium was mildly dilated.  Tracheostomy 09/03/2014 by ENT  PICC 09/05/2015  EGD 09/04/2015 by Dr. Havery Moros. With possible ulcers in the duodenum and proximal stomach. Blood and clots also noted.  Gastroduodenal Artery Embolization - 09-07-15   CONSULTS:   GI, urology , ent, oncology, palliative care  Time spent 30 minutes-Greater than 50% of this time was spent in counseling, explanation of diagnosis, planning of further  management, and coordination of care.  MEDICATIONS: Scheduled Meds: . Influenza vac split quadrivalent PF  0.5 mL Intramuscular Tomorrow-1000  . insulin aspart  0-9 Units Subcutaneous 4 times per day   Continuous Infusions: . Marland KitchenTPN (CLINIMIX-E) Adult 83 mL/hr at 09/08/15 1737   And  . fat emulsion 240 mL (09/08/15 1737)  . Marland KitchenTPN (CLINIMIX-E) Adult     And  . fat emulsion    . lactated ringers 1,000 mL (09/05/15 2145)  . pantoprozole (PROTONIX) infusion 8 mg/hr (09/09/15 0415)   PRN Meds:.acetaminophen, dextromethorphan, hydrALAZINE, lidocaine, LORazepam, morphine injection, [DISCONTINUED] ondansetron **OR** ondansetron (ZOFRAN) IV, oxymetazoline, RESOURCE THICKENUP CLEAR, sodium chloride flush   PHYSICAL EXAM: Vital signs in last 24 hours: Filed Vitals:   09/09/15 0345 09/09/15 0618 09/09/15 0622 09/09/15 0849  BP:  138/122 134/84   Pulse: 77 92 87 77  Temp:  98.1 F (36.7 C)    TempSrc:  Oral    Resp: _0 Height:      Weight:      SpO2: 98% 97%  100%    Weight change:  Filed Weights   08/29/15 1422  Weight: 113.399 kg (250 lb)   Body mass index is 44.3 kg/(m^2).   Gen Exam: Awake and alert with clear speech. Obese.   Neck: Supple-exam limited by body habitus. No stridor, Trach in place Chest:  CVS: S1 S2 regular. Abdomen: soft, non tender Extremities:  Neurologic: Non Focal Psych: cooperative Wounds: N/A.  Intake/Output from previous day:  Intake/Output Summary (Last 24 hours) at 09/09/15 0957 Last data filed at 09/09/15 0820  Gross per 24 hour  Intake 4141.18 ml  Output   2224 ml  Net 1917.18 ml     LAB RESULTS: CBC  Recent Labs Lab 09/04/15 0325  09/05/15 2202 09/06/15 0549 09/06/15 0550  09/07/15 0500 09/07/15 1300 09/07/15 1915 09/08/15 0040 09/08/15 0655 09/09/15 0430  WBC 18.1*  < > 14.9*  --  12.9*  --  12.2*  --   --   --  13.3* 12.7*  HGB 6.6*  < > 9.2*  --  8.2*  < > 8.3* 8.7* 8.4* 8.6* 8.3* 8.2*  HCT 20.7*  < > 27.5*   --  24.6*  < > 24.8* 25.6* 25.8* 26.6* 25.6* 24.6*  PLT 196  < > 113*  --  107*  --  100*  --   --   --  114* 142*  MCV 86.6  < > 83.8  --  84.5  --  87.6  --   --   --  86.8 87.2  MCH 27.6  < > 28.0  --  28.2  --  29.3  --   --   --  28.1 29.1  MCHC 31.9  < > 33.5  --  33.3  --  33.5  --   --   --  32.4 33.3  RDW 15.4  < > 15.6*  --  15.9*  --  16.3*  --   --   --  15.3 15.0  LYMPHSABS 1.3  --   --  1.8  --   --   --   --   --   --   --   --   MONOABS 0.7  --   --  0.8  --   --   --   --   --   --   --   --   EOSABS 0.1  --   --  0.2  --   --   --   --   --   --   --   --   BASOSABS 0.0  --   --  0.0  --   --   --   --   --   --   --   --   < > = values in this interval not displayed.  Chemistries   Recent Labs Lab 09/05/15 0350 09/06/15 0549 09/07/15 0500 09/08/15 0500 09/09/15 0430  NA 145 142 136 139 139  K 3.8 3.3* 4.1 3.5 3.6  CL 115* 108 96* 104 105  CO2 _0 GLUCOSE 127* 135* 433* 147* 134*  BUN 21* _1 CREATININE 0.96 0.93 0.82 0.71 0.62  CALCIUM 7.1* 7.0* 7.2* 7.7* 7.7*  MG 1.1* 1.6* 1.9 1.6* 1.8    CBG:  Recent Labs Lab 09/08/15 1220 09/08/15 1638 09/09/15 0014 09/09/15 0614 09/09/15 0830  GLUCAP 142* 136* 159* 141* 150*    GFR Estimated Creatinine Clearance: 91.8 mL/min (by C-G formula based on Cr of 0.62).  Coagulation profile  Recent Labs Lab 09/04/15 1504  INR 1.34    Cardiac Enzymes No results for input(s): CKMB, TROPONINI, MYOGLOBIN in the last 168 hours.  Invalid input(s): CK  Invalid input(s): POCBNP No results for input(s): DDIMER in the last 72 hours. No results for input(s): HGBA1C in the last 72 hours. No results for input(s): CHOL, HDL, LDLCALC,  TRIG, CHOLHDL, LDLDIRECT in the last 72 hours. No results for input(s): TSH, T4TOTAL, T3FREE, THYROIDAB in the last 72 hours.  Invalid input(s): FREET3 No results for input(s): VITAMINB12, FOLATE, FERRITIN, TIBC, IRON, RETICCTPCT in the last 72 hours. No results for  input(s): LIPASE, AMYLASE in the last 72 hours.  Urine Studies No results for input(s): UHGB, CRYS in the last 72 hours.  Invalid input(s): UACOL, UAPR, USPG, UPH, UTP, UGL, UKET, UBIL, UNIT, UROB, ULEU, UEPI, UWBC, URBC, UBAC, CAST, UCOM, BILUA  MICROBIOLOGY: Recent Results (from the past 240 hour(s))  Surgical pcr screen     Status: None   Collection Time: 09/01/15  4:09 AM  Result Value Ref Range Status   MRSA, PCR NEGATIVE NEGATIVE Final   Staphylococcus aureus NEGATIVE NEGATIVE Final    Comment:        The Xpert SA Assay (FDA approved for NASAL specimens in patients over 36 years of age), is one component of a comprehensive surveillance program.  Test performance has been validated by [facility] for patients greater than or equal to 37 year old. It is not intended to diagnose infection nor to guide or monitor treatment.   MRSA PCR Screening     Status: None   Collection Time: 09/04/15  7:03 PM  Result Value Ref Range Status   MRSA by PCR NEGATIVE NEGATIVE Final    Comment:        The GeneXpert MRSA Assay (FDA approved for NASAL specimens only), is one component of a comprehensive MRSA colonization surveillance program. It is not intended to diagnose MRSA infection nor to guide or monitor treatment for MRSA infections.     RADIOLOGY STUDIES/RESULTS: Dg Chest 2 View  08/29/2015  CLINICAL DATA:  One month history of chest pain and hoarse voice. EXAM: CHEST  2 VIEW COMPARISON:  None. FINDINGS: The heart is borderline and enlarged. Mild tortuosity of the thoracic aorta. Low lung volumes with mild vascular crowding and streaky basilar atelectasis. There also bronchitic changes which could be acute or chronic. No infiltrates or effusions. The bony thorax is intact. IMPRESSION: Acute versus chronic bronchitic change.  No infiltrates or effusions Electronically Signed   By: Marijo Sanes M.D.   On: 08/29/2015 16:15   Ct Soft Tissue Neck W Contrast  08/29/2015  CLINICAL  DATA:  60 year old female with abnormal full waist for 1 month. Abdominal pain. Three days of vomiting. Initial encounter. EXAM: CT NECK WITH CONTRAST TECHNIQUE: Multidetector CT imaging of the neck was performed using the standard protocol following the bolus administration of intravenous contrast. CONTRAST:  22m OMNIPAQUE IOHEXOL 300 MG/ML SOLN in conjunction with contrast enhanced imaging of the chest, abdomen, and pelvis reported separately. COMPARISON:  CT chest abdomen and pelvis from today reported separately FINDINGS: Pharynx and larynx: Bulky supraglottic laryngeal tumor with marked soft tissue enlargement of the bilateral aryepiglottic folds and anterior commissure up to 17 mm in thickness. Tumor appears inseparable from the undersurface of the strap muscles suggesting extension through the thyroid cartilage bilaterally. Posterior hypopharynx involvement. The epiglottis is thickened, nodular, and distorted. Furthermore, the vallecula is mostly effaced and soft tissue at the base of tongue appears nodular and thickened. All told, tumor encompasses 37 x 40 x 58 mm (AP by transverse by CC). The other laryngeal cartilages appear spared. The palatine tonsils, soft palate, and nasopharynx appear within normal limits. Superior parapharyngeal spaces and retropharyngeal space are within normal limits. Salivary glands: Sublingual space, submandibular glands, and parotid glands are within normal limits.  Thyroid: Coarsely calcified 2.3 cm right thyroid nodule. Subcentimeter left lobe nodule. Lymph nodes: Abnormal right level 3 node measuring 9 mm at the level of the thyroid cartilage (series 1, image 57). Small but asymmetric 5 mm right level IIIa lymph node at the level of the hyoid bone on the right (series 1, image 49). Bilateral level 2A nodes appear symmetric measuring 8-9 mm in thickness. No level 4, 5, or level 1 lymphadenopathy. Vascular: Suboptimal intravascular contrast timing, but major vascular structures  in the neck and at the skullbase appear to remain patent. Left greater than right carotid bifurcation calcified atherosclerosis. Limited intracranial: Negative.  Partially empty sella. Visualized orbits: Negative. Mastoids and visualized paranasal sinuses: Visualized paranasal sinuses and mastoids are clear. Skeleton: Mostly absent dentition. Periapical lucency about the residual left mandible molar. No osseous metastatic disease identified. Upper chest: Reported separately today. IMPRESSION: 1. 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 2. Suspect metastatic nodal disease at level 3 on the right (small but asymmetric up to 9 mm nodes). Bilateral level 2 nodes are indeterminate and also measure up to 9 mm individually. No cystic or necrotic nodes in the neck. 3. See CT chest abdomen and pelvis from today reported separately. Electronically Signed   By: Genevie Ann M.D.   On: 08/29/2015 19:56   Ct Chest W Contrast  08/29/2015  CLINICAL DATA:  60 year old female with 2-3 day history of vomiting. Abdominal pain for the past month. Loss of voice. EXAM: CT CHEST, ABDOMEN, AND PELVIS WITH CONTRAST TECHNIQUE: Multidetector CT imaging of the chest, abdomen and pelvis was performed following the standard protocol during bolus administration of intravenous contrast. CONTRAST:  69m OMNIPAQUE IOHEXOL 300 MG/ML  SOLN COMPARISON:  No priors. FINDINGS: CT CHEST FINDINGS Mediastinum/Lymph Nodes: Heart size is normal. There is no significant pericardial fluid, thickening or pericardial calcification. There is atherosclerosis of the thoracic aorta, the great vessels of the mediastinum and the coronary arteries, including calcified atherosclerotic plaque in the left main, left anterior descending, left circumflex and right coronary arteries. Calcifications of the aortic valve and mitral valve/annulus. No pathologically enlarged mediastinal or  hilar lymph nodes. Esophagus is unremarkable in appearance. No axillary lymphadenopathy. Lungs/Pleura: 4 mm subpleural nodule in the lateral segment of the right middle lobe (image 31 of series 4). 4 mm subpleural nodule in the posterior left lower lobe (image 38 of series 4). No other suspicious appearing pulmonary nodules or masses. No acute consolidative airspace disease. No pleural effusions. Musculoskeletal/Soft Tissues: There are no aggressive appearing lytic or blastic lesions noted in the visualized portions of the skeleton. CT ABDOMEN AND PELVIS FINDINGS Hepatobiliary: No cystic or solid hepatic lesions. No intra or extrahepatic biliary ductal dilatation. 7 mm calcified gallstone lying dependently in the gallbladder. No current findings to suggest an acute cholecystitis at this time. Pancreas: No pancreatic mass. No pancreatic ductal dilatation. No pancreatic or peripancreatic fluid or inflammatory changes. Spleen: Unremarkable. Adrenals/Urinary Tract: In the lower pole of the left kidney there is a 5.9 x 6.1 x 6.3 cm heterogeneously enhancing lesion highly concerning for renal cell carcinoma. The inferior aspect of this lesion comes in very close proximity to the anterior aspect of the left psoas muscle and quadratus lumborum muscle, however, there appears to be an intervening fat plane at this time. The lesion is well separated from the left renal vein. Right kidney and right adrenal gland are normal in appearance. 1.2 x  1.4 cm indeterminate nodule in the lateral limb of the left adrenal gland. No hydroureteronephrosis. Urinary bladder is largely decompressed, but otherwise unremarkable in appearance. Stomach/Bowel: Normal appearance of the stomach. No pathologic dilatation of small bowel or colon. A few scattered colonic diverticulae are noted, without surrounding inflammatory changes to suggest an acute diverticulitis at this time. Appendix is normal. Vascular/Lymphatic: Atherosclerosis throughout the  abdominal and pelvic vasculature, without evidence of aneurysm or dissection. Left renal vein is widely patent and separate from the lower pole mass. No lymphadenopathy noted in the abdomen or pelvis. Reproductive: Uterus and ovaries are unremarkable in appearance. Other: No significant volume of ascites.  No pneumoperitoneum. Musculoskeletal: There are no aggressive appearing lytic or blastic lesions noted in the visualized portions of the skeleton. IMPRESSION: 1. No acute findings in the abdomen or pelvis to account for the patient's symptoms. 2. However, there is a 5.9 x 6.1 x 6.3 cm heterogeneously enhancing mass in the lower pole of the left kidney highly concerning for renal cell carcinoma. At this time, the lesion appears likely to be encapsulated within Gerota's fascia (although it comes very close to the left psoas and quadratus lumborum musculature), does not involve the left renal vein, and does not appear to be associated with lymphadenopathy. Nonemergent Urologic consultation for surgical resection is strongly recommended in the near future. 3. 1.2 x 1.4 cm indeterminate nodule in the left adrenal gland. Attention on followup studies is recommended, as a metastatic lesion is not excluded. 4. 4 mm subpleural nodules in the right middle lobe and left lower lobe. These are highly nonspecific, and favored to represent subpleural lymph nodes, but attention on followup studies is recommended to ensure stability. 5. Cholelithiasis without evidence of acute cholecystitis at this time. 6. Atherosclerosis, including left main and 3 vessel coronary artery disease. Please note that although the presence of coronary artery calcium documents the presence of coronary artery disease, the severity of this disease and any potential stenosis cannot be assessed on this non-gated CT examination. Assessment for potential risk factor modification, dietary therapy or pharmacologic therapy may be warranted, if clinically  indicated. 7. There are calcifications of the aortic valve and mitral valve/annulus. Echocardiographic correlation for evaluation of potential valvular dysfunction may be warranted if clinically indicated. 8. Colonic diverticulosis without evidence of acute diverticulitis at this time. 9. Additional incidental findings, as above. These results were called by telephone at the time of interpretation on 08/29/2015 at 7:34 pm to Dr. Davonna Belling, who verbally acknowledged these results. Electronically Signed   By: Vinnie Langton M.D.   On: 08/29/2015 19:37   Ct Abdomen Pelvis W Contrast  08/29/2015  CLINICAL DATA:  60 year old female with 2-3 day history of vomiting. Abdominal pain for the past month. Loss of voice. EXAM: CT CHEST, ABDOMEN, AND PELVIS WITH CONTRAST TECHNIQUE: Multidetector CT imaging of the chest, abdomen and pelvis was performed following the standard protocol during bolus administration of intravenous contrast. CONTRAST:  59m OMNIPAQUE IOHEXOL 300 MG/ML  SOLN COMPARISON:  No priors. FINDINGS: CT CHEST FINDINGS Mediastinum/Lymph Nodes: Heart size is normal. There is no significant pericardial fluid, thickening or pericardial calcification. There is atherosclerosis of the thoracic aorta, the great vessels of the mediastinum and the coronary arteries, including calcified atherosclerotic plaque in the left main, left anterior descending, left circumflex and right coronary arteries. Calcifications of the aortic valve and mitral valve/annulus. No pathologically enlarged mediastinal or hilar lymph nodes. Esophagus is unremarkable in appearance. No axillary lymphadenopathy. Lungs/Pleura: 4 mm subpleural  nodule in the lateral segment of the right middle lobe (image 31 of series 4). 4 mm subpleural nodule in the posterior left lower lobe (image 38 of series 4). No other suspicious appearing pulmonary nodules or masses. No acute consolidative airspace disease. No pleural effusions. Musculoskeletal/Soft  Tissues: There are no aggressive appearing lytic or blastic lesions noted in the visualized portions of the skeleton. CT ABDOMEN AND PELVIS FINDINGS Hepatobiliary: No cystic or solid hepatic lesions. No intra or extrahepatic biliary ductal dilatation. 7 mm calcified gallstone lying dependently in the gallbladder. No current findings to suggest an acute cholecystitis at this time. Pancreas: No pancreatic mass. No pancreatic ductal dilatation. No pancreatic or peripancreatic fluid or inflammatory changes. Spleen: Unremarkable. Adrenals/Urinary Tract: In the lower pole of the left kidney there is a 5.9 x 6.1 x 6.3 cm heterogeneously enhancing lesion highly concerning for renal cell carcinoma. The inferior aspect of this lesion comes in very close proximity to the anterior aspect of the left psoas muscle and quadratus lumborum muscle, however, there appears to be an intervening fat plane at this time. The lesion is well separated from the left renal vein. Right kidney and right adrenal gland are normal in appearance. 1.2 x 1.4 cm indeterminate nodule in the lateral limb of the left adrenal gland. No hydroureteronephrosis. Urinary bladder is largely decompressed, but otherwise unremarkable in appearance. Stomach/Bowel: Normal appearance of the stomach. No pathologic dilatation of small bowel or colon. A few scattered colonic diverticulae are noted, without surrounding inflammatory changes to suggest an acute diverticulitis at this time. Appendix is normal. Vascular/Lymphatic: Atherosclerosis throughout the abdominal and pelvic vasculature, without evidence of aneurysm or dissection. Left renal vein is widely patent and separate from the lower pole mass. No lymphadenopathy noted in the abdomen or pelvis. Reproductive: Uterus and ovaries are unremarkable in appearance. Other: No significant volume of ascites.  No pneumoperitoneum. Musculoskeletal: There are no aggressive appearing lytic or blastic lesions noted in the  visualized portions of the skeleton. IMPRESSION: 1. No acute findings in the abdomen or pelvis to account for the patient's symptoms. 2. However, there is a 5.9 x 6.1 x 6.3 cm heterogeneously enhancing mass in the lower pole of the left kidney highly concerning for renal cell carcinoma. At this time, the lesion appears likely to be encapsulated within Gerota's fascia (although it comes very close to the left psoas and quadratus lumborum musculature), does not involve the left renal vein, and does not appear to be associated with lymphadenopathy. Nonemergent Urologic consultation for surgical resection is strongly recommended in the near future. 3. 1.2 x 1.4 cm indeterminate nodule in the left adrenal gland. Attention on followup studies is recommended, as a metastatic lesion is not excluded. 4. 4 mm subpleural nodules in the right middle lobe and left lower lobe. These are highly nonspecific, and favored to represent subpleural lymph nodes, but attention on followup studies is recommended to ensure stability. 5. Cholelithiasis without evidence of acute cholecystitis at this time. 6. Atherosclerosis, including left main and 3 vessel coronary artery disease. Please note that although the presence of coronary artery calcium documents the presence of coronary artery disease, the severity of this disease and any potential stenosis cannot be assessed on this non-gated CT examination. Assessment for potential risk factor modification, dietary therapy or pharmacologic therapy may be warranted, if clinically indicated. 7. There are calcifications of the aortic valve and mitral valve/annulus. Echocardiographic correlation for evaluation of potential valvular dysfunction may be warranted if clinically indicated. 8. Colonic diverticulosis  without evidence of acute diverticulitis at this time. 9. Additional incidental findings, as above. These results were called by telephone at the time of interpretation on 08/29/2015 at 7:34 pm  to Dr. Davonna Belling, who verbally acknowledged these results. Electronically Signed   By: Vinnie Langton M.D.   On: 08/29/2015 19:37   Ir Angiogram Visceral Selective  09/07/2015  INDICATION: History of duodenal ulcer now with acute upper GI bleed. Please perform mesenteric arteriogram and potential percutaneous coil embolization. EXAM: 1. ULTRASOUND GUIDANCE FOR ARTERIAL ACCESS. 2. SELECTIVE CELIAC AND SUPERIOR MESENTERIC ARTERIOGRAMS 3. SELECTED PROPER HEPATIC ARTERIOGRAM 4. SELECTIVE GASTRODUODENAL ARTERIOGRAM AND PERCUTANEOUS COIL EMBOLIZATION MEDICATIONS: None ANESTHESIA/SEDATION: Fentanyl 75 mcg IV; Versed 1.5 mg IV Moderate Sedation Time: 65 minutes; The patient was continuously monitored during the procedure by the interventional radiology nurse under my direct supervision. CONTRAST:  90m OMNIPAQUE IOHEXOL 300 MG/ML  SOLN FLUOROSCOPY TIME:  Fluoroscopy Time: 18 minutes 24 seconds (1595 mGy). COMPLICATIONS: SIR Level A - No therapy, no consequence. The procedure was complicated by non targeted embolization of a a 2 mm x 4 mm non fiber interlock coil within a cranial subsegmental branch vessel of the splenic artery. PROCEDURE: Informed consent was obtained from the patient following explanation of the procedure, risks, benefits and alternatives. The patient understands, agrees and consents for the procedure. All questions were addressed. A time out was performed prior to the initiation of the procedure. Maximal barrier sterile technique utilized including caps, mask, sterile gowns, sterile gloves, large sterile drape, hand hygiene, and Betadine prep. The right femoral head was marked fluoroscopically. Under ultrasound guidance, the right common femoral artery was accessed with a micropuncture kit after the overlying soft tissues were anesthetized with 1% lidocaine. An ultrasound image was saved for documentation purposes. The micropuncture sheath was exchanged for a 5 [place] sheath over a  Bentson wire. A closure arteriogram was performed through the side of the sheath confirming access within the right common femoral artery. Over a Bentson wire, a Mickelson catheter was advanced to the level of the thoracic aorta where it was back bled and flushed. The catheter was then utilized to select the celiac artery and a selective celiac arteriogram was performed. With the use of a Fathom micro wire, a regular Renegade micro catheter was utilized to select the proper hepatic artery and a selective proper hepatic arteriogram was performed. The microcatheter was then utilized to select the gastroduodenal artery and a selective gastroduodenal arteriogram was performed. The micro catheter was advanced further into the distal aspect of the gastroduodenal artery. Following sub selective injection, a 2 mm x 4 mm non fibered interlock coil was attempted to be deployed within the distal aspect of the gastroduodenal artery however it became apparent that the micro catheter I was given was NOT a regular sized catheter but rather was a high-flow catheter. As such, coil became entrapped within the distal aspect of the micro catheter. With some manipulation, the coil was ultimately safely deployed within a cranial subsegmental branch of the splenic artery. The incorrect the microcatheter was discarded and a regular Renegade micro catheter was again utilized to select the gastroduodenal artery. Sub selective injection was performed and the gastroduodenal artery was percutaneously coil embolized with multiple overlapping 2 mm, 3 mm and 4 mm interlock fibered and non-fibered coils to near the vessel's origin. A completion proper hepatic arteriogram was performed. The micro catheter was removed and completion celiac and superior mesenteric arteriograms were performed. Images reviewed and the procedure was terminated.  All wires, catheters and sheaths were removed from the patient. Hemostasis was achieved at the right groin  access site with deployment of an Exoseal closure device. A dressing was placed. The patient tolerated the procedure well without immediate postprocedural complication. FINDINGS: Celiac arteriogram demonstrates a conventional branching pattern. Superior mesenteric arteriogram failed to delineate any retrograde contribution to the gastroduodenal artery. Selective proper hepatic arteriogram demonstrates a markedly diminutive and irregular appearing gastroduodenal artery which was confirmed with selective gastroduodenal arteriogram. Ultimately successful percutaneous coil embolization of the gastroduodenal artery to near the vessels origin. Note, procedure was complicated by non target embolization of a cranial subsegmental branch vessel of the splenic artery. Completion proper hepatic arteriogram and demonstrates complete occlusion of the GDA. Completion celiac arteriogram demonstrates a hypertrophied gastroepiploic artery arising from the left gastric artery without definitive contribution to the duodenal bulb. IMPRESSION: 1. Technically successful prophylactic percutaneous coil embolization of the gastroduodenal artery for acute upper GI bleed. 2. Procedure complicated by non target embolization of a cranial subsegmental branch vessel of the splenic artery secondary to mismatch of catheter sizing. This non target coil is felt to be of no clinical significance. Above findings were discussed with Dr. Lala Lund , at the time of procedure completion. Electronically Signed   By: Sandi Mariscal M.D.   On: 09/07/2015 18:09   Ir Angiogram Visceral Selective  09/07/2015  INDICATION: History of duodenal ulcer now with acute upper GI bleed. Please perform mesenteric arteriogram and potential percutaneous coil embolization. EXAM: 1. ULTRASOUND GUIDANCE FOR ARTERIAL ACCESS. 2. SELECTIVE CELIAC AND SUPERIOR MESENTERIC ARTERIOGRAMS 3. SELECTED PROPER HEPATIC ARTERIOGRAM 4. SELECTIVE GASTRODUODENAL ARTERIOGRAM AND PERCUTANEOUS  COIL EMBOLIZATION MEDICATIONS: None ANESTHESIA/SEDATION: Fentanyl 75 mcg IV; Versed 1.5 mg IV Moderate Sedation Time: 65 minutes; The patient was continuously monitored during the procedure by the interventional radiology nurse under my direct supervision. CONTRAST:  35m OMNIPAQUE IOHEXOL 300 MG/ML  SOLN FLUOROSCOPY TIME:  Fluoroscopy Time: 18 minutes 24 seconds (1595 mGy). COMPLICATIONS: SIR Level A - No therapy, no consequence. The procedure was complicated by non targeted embolization of a a 2 mm x 4 mm non fiber interlock coil within a cranial subsegmental branch vessel of the splenic artery. PROCEDURE: Informed consent was obtained from the patient following explanation of the procedure, risks, benefits and alternatives. The patient understands, agrees and consents for the procedure. All questions were addressed. A time out was performed prior to the initiation of the procedure. Maximal barrier sterile technique utilized including caps, mask, sterile gowns, sterile gloves, large sterile drape, hand hygiene, and Betadine prep. The right femoral head was marked fluoroscopically. Under ultrasound guidance, the right common femoral artery was accessed with a micropuncture kit after the overlying soft tissues were anesthetized with 1% lidocaine. An ultrasound image was saved for documentation purposes. The micropuncture sheath was exchanged for a 5 [place] sheath over a Bentson wire. A closure arteriogram was performed through the side of the sheath confirming access within the right common femoral artery. Over a Bentson wire, a Mickelson catheter was advanced to the level of the thoracic aorta where it was back bled and flushed. The catheter was then utilized to select the celiac artery and a selective celiac arteriogram was performed. With the use of a Fathom micro wire, a regular Renegade micro catheter was utilized to select the proper hepatic artery and a selective proper hepatic arteriogram was  performed. The microcatheter was then utilized to select the gastroduodenal artery and a selective gastroduodenal arteriogram was performed. The micro  catheter was advanced further into the distal aspect of the gastroduodenal artery. Following sub selective injection, a 2 mm x 4 mm non fibered interlock coil was attempted to be deployed within the distal aspect of the gastroduodenal artery however it became apparent that the micro catheter I was given was NOT a regular sized catheter but rather was a high-flow catheter. As such, coil became entrapped within the distal aspect of the micro catheter. With some manipulation, the coil was ultimately safely deployed within a cranial subsegmental branch of the splenic artery. The incorrect the microcatheter was discarded and a regular Renegade micro catheter was again utilized to select the gastroduodenal artery. Sub selective injection was performed and the gastroduodenal artery was percutaneously coil embolized with multiple overlapping 2 mm, 3 mm and 4 mm interlock fibered and non-fibered coils to near the vessel's origin. A completion proper hepatic arteriogram was performed. The micro catheter was removed and completion celiac and superior mesenteric arteriograms were performed. Images reviewed and the procedure was terminated. All wires, catheters and sheaths were removed from the patient. Hemostasis was achieved at the right groin access site with deployment of an Exoseal closure device. A dressing was placed. The patient tolerated the procedure well without immediate postprocedural complication. FINDINGS: Celiac arteriogram demonstrates a conventional branching pattern. Superior mesenteric arteriogram failed to delineate any retrograde contribution to the gastroduodenal artery. Selective proper hepatic arteriogram demonstrates a markedly diminutive and irregular appearing gastroduodenal artery which was confirmed with selective gastroduodenal arteriogram.  Ultimately successful percutaneous coil embolization of the gastroduodenal artery to near the vessels origin. Note, procedure was complicated by non target embolization of a cranial subsegmental branch vessel of the splenic artery. Completion proper hepatic arteriogram and demonstrates complete occlusion of the GDA. Completion celiac arteriogram demonstrates a hypertrophied gastroepiploic artery arising from the left gastric artery without definitive contribution to the duodenal bulb. IMPRESSION: 1. Technically successful prophylactic percutaneous coil embolization of the gastroduodenal artery for acute upper GI bleed. 2. Procedure complicated by non target embolization of a cranial subsegmental branch vessel of the splenic artery secondary to mismatch of catheter sizing. This non target coil is felt to be of no clinical significance. Above findings were discussed with Dr. Lala Lund , at the time of procedure completion. Electronically Signed   By: Sandi Mariscal M.D.   On: 09/07/2015 18:09   Ir Angiogram Selective Each Additional Vessel  09/07/2015  INDICATION: History of duodenal ulcer now with acute upper GI bleed. Please perform mesenteric arteriogram and potential percutaneous coil embolization. EXAM: 1. ULTRASOUND GUIDANCE FOR ARTERIAL ACCESS. 2. SELECTIVE CELIAC AND SUPERIOR MESENTERIC ARTERIOGRAMS 3. SELECTED PROPER HEPATIC ARTERIOGRAM 4. SELECTIVE GASTRODUODENAL ARTERIOGRAM AND PERCUTANEOUS COIL EMBOLIZATION MEDICATIONS: None ANESTHESIA/SEDATION: Fentanyl 75 mcg IV; Versed 1.5 mg IV Moderate Sedation Time: 65 minutes; The patient was continuously monitored during the procedure by the interventional radiology nurse under my direct supervision. CONTRAST:  54m OMNIPAQUE IOHEXOL 300 MG/ML  SOLN FLUOROSCOPY TIME:  Fluoroscopy Time: 18 minutes 24 seconds (1595 mGy). COMPLICATIONS: SIR Level A - No therapy, no consequence. The procedure was complicated by non targeted embolization of a a 2 mm x 4 mm non  fiber interlock coil within a cranial subsegmental branch vessel of the splenic artery. PROCEDURE: Informed consent was obtained from the patient following explanation of the procedure, risks, benefits and alternatives. The patient understands, agrees and consents for the procedure. All questions were addressed. A time out was performed prior to the initiation of the procedure. Maximal barrier sterile technique utilized including  caps, mask, sterile gowns, sterile gloves, large sterile drape, hand hygiene, and Betadine prep. The right femoral head was marked fluoroscopically. Under ultrasound guidance, the right common femoral artery was accessed with a micropuncture kit after the overlying soft tissues were anesthetized with 1% lidocaine. An ultrasound image was saved for documentation purposes. The micropuncture sheath was exchanged for a 5 [place] vascular sheath over a Bentson wire. A closure arteriogram was performed through the side of the sheath confirming access within the right common femoral artery. Over a Bentson wire, a Mickelson catheter was advanced to the level of the thoracic aorta where it was back bled and flushed. The catheter was then utilized to select the celiac artery and a selective celiac arteriogram was performed. With the use of a Fathom micro wire, a regular Renegade micro catheter was utilized to select the proper hepatic artery and a selective proper hepatic arteriogram was performed. The microcatheter was then utilized to select the gastroduodenal artery and a selective gastroduodenal arteriogram was performed. The micro catheter was advanced further into the distal aspect of the gastroduodenal artery. Following sub selective injection, a 2 mm x 4 mm non fibered interlock coil was attempted to be deployed within the distal aspect of the gastroduodenal artery however it became apparent that the micro catheter I was given was NOT a regular sized catheter but rather was a high-flow  catheter. As such, coil became entrapped within the distal aspect of the micro catheter. With some manipulation, the coil was ultimately safely deployed within a cranial subsegmental branch of the splenic artery. The incorrect the microcatheter was discarded and a regular Renegade micro catheter was again utilized to select the gastroduodenal artery. Sub selective injection was performed and the gastroduodenal artery was percutaneously coil embolized with multiple overlapping 2 mm, 3 mm and 4 mm interlock fibered and non-fibered coils to near the vessel's origin. A completion proper hepatic arteriogram was performed. The micro catheter was removed and completion celiac and superior mesenteric arteriograms were performed. Images reviewed and the procedure was terminated. All wires, catheters and sheaths were removed from the patient. Hemostasis was achieved at the right groin access site with deployment of an Exoseal closure device. A dressing was placed. The patient tolerated the procedure well without immediate postprocedural complication. FINDINGS: Celiac arteriogram demonstrates a conventional branching pattern. Superior mesenteric arteriogram failed to delineate any retrograde contribution to the gastroduodenal artery. Selective proper hepatic arteriogram demonstrates a markedly diminutive and irregular appearing gastroduodenal artery which was confirmed with selective gastroduodenal arteriogram. Ultimately successful percutaneous coil embolization of the gastroduodenal artery to near the vessels origin. Note, procedure was complicated by non target embolization of a cranial subsegmental branch vessel of the splenic artery. Completion proper hepatic arteriogram and demonstrates complete occlusion of the GDA. Completion celiac arteriogram demonstrates a hypertrophied gastroepiploic artery arising from the left gastric artery without definitive contribution to the duodenal bulb. IMPRESSION: 1. Technically  successful prophylactic percutaneous coil embolization of the gastroduodenal artery for acute upper GI bleed. 2. Procedure complicated by non target embolization of a cranial subsegmental branch vessel of the splenic artery secondary to mismatch of catheter sizing. This non target coil is felt to be of no clinical significance. Above findings were discussed with Dr. Lala Lund , at the time of procedure completion. Electronically Signed   By: Sandi Mariscal M.D.   On: 09/07/2015 18:09   Ir Angiogram Follow Up Study  09/07/2015  INDICATION: History of duodenal ulcer now with acute upper GI bleed. Please perform  mesenteric arteriogram and potential percutaneous coil embolization. EXAM: 1. ULTRASOUND GUIDANCE FOR ARTERIAL ACCESS. 2. SELECTIVE CELIAC AND SUPERIOR MESENTERIC ARTERIOGRAMS 3. SELECTED PROPER HEPATIC ARTERIOGRAM 4. SELECTIVE GASTRODUODENAL ARTERIOGRAM AND PERCUTANEOUS COIL EMBOLIZATION MEDICATIONS: None ANESTHESIA/SEDATION: Fentanyl 75 mcg IV; Versed 1.5 mg IV Moderate Sedation Time: 65 minutes; The patient was continuously monitored during the procedure by the interventional radiology nurse under my direct supervision. CONTRAST:  73m OMNIPAQUE IOHEXOL 300 MG/ML  SOLN FLUOROSCOPY TIME:  Fluoroscopy Time: 18 minutes 24 seconds (1595 mGy). COMPLICATIONS: SIR Level A - No therapy, no consequence. The procedure was complicated by non targeted embolization of a a 2 mm x 4 mm non fiber interlock coil within a cranial subsegmental branch vessel of the splenic artery. PROCEDURE: Informed consent was obtained from the patient following explanation of the procedure, risks, benefits and alternatives. The patient understands, agrees and consents for the procedure. All questions were addressed. A time out was performed prior to the initiation of the procedure. Maximal barrier sterile technique utilized including caps, mask, sterile gowns, sterile gloves, large sterile drape, hand hygiene, and Betadine prep. The  right femoral head was marked fluoroscopically. Under ultrasound guidance, the right common femoral artery was accessed with a micropuncture kit after the overlying soft tissues were anesthetized with 1% lidocaine. An ultrasound image was saved for documentation purposes. The micropuncture sheath was exchanged for a 5 [place] sheath over a Bentson wire. A closure arteriogram was performed through the side of the sheath confirming access within the right common femoral artery. Over a Bentson wire, a Mickelson catheter was advanced to the level of the thoracic aorta where it was back bled and flushed. The catheter was then utilized to select the celiac artery and a selective celiac arteriogram was performed. With the use of a Fathom micro wire, a regular Renegade micro catheter was utilized to select the proper hepatic artery and a selective proper hepatic arteriogram was performed. The microcatheter was then utilized to select the gastroduodenal artery and a selective gastroduodenal arteriogram was performed. The micro catheter was advanced further into the distal aspect of the gastroduodenal artery. Following sub selective injection, a 2 mm x 4 mm non fibered interlock coil was attempted to be deployed within the distal aspect of the gastroduodenal artery however it became apparent that the micro catheter I was given was NOT a regular sized catheter but rather was a high-flow catheter. As such, coil became entrapped within the distal aspect of the micro catheter. With some manipulation, the coil was ultimately safely deployed within a cranial subsegmental branch of the splenic artery. The incorrect the microcatheter was discarded and a regular Renegade micro catheter was again utilized to select the gastroduodenal artery. Sub selective injection was performed and the gastroduodenal artery was percutaneously coil embolized with multiple overlapping 2 mm, 3 mm and 4 mm interlock fibered and non-fibered coils  to near the vessel's origin. A completion proper hepatic arteriogram was performed. The micro catheter was removed and completion celiac and superior mesenteric arteriograms were performed. Images reviewed and the procedure was terminated. All wires, catheters and sheaths were removed from the patient. Hemostasis was achieved at the right groin access site with deployment of an Exoseal closure device. A dressing was placed. The patient tolerated the procedure well without immediate postprocedural complication. FINDINGS: Celiac arteriogram demonstrates a conventional branching pattern. Superior mesenteric arteriogram failed to delineate any retrograde contribution to the gastroduodenal artery. Selective proper hepatic arteriogram demonstrates a markedly diminutive and irregular appearing gastroduodenal artery which  was confirmed with selective gastroduodenal arteriogram. Ultimately successful percutaneous coil embolization of the gastroduodenal artery to near the vessels origin. Note, procedure was complicated by non target embolization of a cranial subsegmental branch vessel of the splenic artery. Completion proper hepatic arteriogram and demonstrates complete occlusion of the GDA. Completion celiac arteriogram demonstrates a hypertrophied gastroepiploic artery arising from the left gastric artery without definitive contribution to the duodenal bulb. IMPRESSION: 1. Technically successful prophylactic percutaneous coil embolization of the gastroduodenal artery for acute upper GI bleed. 2. Procedure complicated by non target embolization of a cranial subsegmental branch vessel of the splenic artery secondary to mismatch of catheter sizing. This non target coil is felt to be of no clinical significance. Above findings were discussed with Dr. Lala Lund , at the time of procedure completion. Electronically Signed   By: Sandi Mariscal M.D.   On: 09/07/2015 18:09   Ir US Guide Vasc Access Right  09/07/2015   INDICATION: History of duodenal ulcer now with acute upper GI bleed. Please perform mesenteric arteriogram and potential percutaneous coil embolization. EXAM: 1. ULTRASOUND GUIDANCE FOR ARTERIAL ACCESS. 2. SELECTIVE CELIAC AND SUPERIOR MESENTERIC ARTERIOGRAMS 3. SELECTED PROPER HEPATIC ARTERIOGRAM 4. SELECTIVE GASTRODUODENAL ARTERIOGRAM AND PERCUTANEOUS COIL EMBOLIZATION MEDICATIONS: None ANESTHESIA/SEDATION: Fentanyl 75 mcg IV; Versed 1.5 mg IV Moderate Sedation Time: 65 minutes; The patient was continuously monitored during the procedure by the interventional radiology nurse under my direct supervision. CONTRAST:  60m OMNIPAQUE IOHEXOL 300 MG/ML  SOLN FLUOROSCOPY TIME:  Fluoroscopy Time: 18 minutes 24 seconds (1595 mGy). COMPLICATIONS: SIR Level A - No therapy, no consequence. The procedure was complicated by non targeted embolization of a a 2 mm x 4 mm non fiber interlock coil within a cranial subsegmental branch vessel of the splenic artery. PROCEDURE: Informed consent was obtained from the patient following explanation of the procedure, risks, benefits and alternatives. The patient understands, agrees and consents for the procedure. All questions were addressed. A time out was performed prior to the initiation of the procedure. Maximal barrier sterile technique utilized including caps, mask, sterile gowns, sterile gloves, large sterile drape, hand hygiene, and Betadine prep. The right femoral head was marked fluoroscopically. Under ultrasound guidance, the right common femoral artery was accessed with a micropuncture kit after the overlying soft tissues were anesthetized with 1% lidocaine. An ultrasound image was saved for documentation purposes. The micropuncture sheath was exchanged for a 5 [place] sheath over a Bentson wire. A closure arteriogram was performed through the side of the sheath confirming access within the right common femoral artery. Over a Bentson wire, a Mickelson catheter was  advanced to the level of the thoracic aorta where it was back bled and flushed. The catheter was then utilized to select the celiac artery and a selective celiac arteriogram was performed. With the use of a Fathom micro wire, a regular Renegade micro catheter was utilized to select the proper hepatic artery and a selective proper hepatic arteriogram was performed. The microcatheter was then utilized to select the gastroduodenal artery and a selective gastroduodenal arteriogram was performed. The micro catheter was advanced further into the distal aspect of the gastroduodenal artery. Following sub selective injection, a 2 mm x 4 mm non fibered interlock coil was attempted to be deployed within the distal aspect of the gastroduodenal artery however it became apparent that the micro catheter I was given was NOT a regular sized catheter but rather was a high-flow catheter. As such, coil became entrapped within the distal aspect of the micro  catheter. With some manipulation, the coil was ultimately safely deployed within a cranial subsegmental branch of the splenic artery. The incorrect the microcatheter was discarded and a regular Renegade micro catheter was again utilized to select the gastroduodenal artery. Sub selective injection was performed and the gastroduodenal artery was percutaneously coil embolized with multiple overlapping 2 mm, 3 mm and 4 mm interlock fibered and non-fibered coils to near the vessel's origin. A completion proper hepatic arteriogram was performed. The micro catheter was removed and completion celiac and superior mesenteric arteriograms were performed. Images reviewed and the procedure was terminated. All wires, catheters and sheaths were removed from the patient. Hemostasis was achieved at the right groin access site with deployment of an Exoseal closure device. A dressing was placed. The patient tolerated the procedure well without immediate postprocedural complication. FINDINGS: Celiac  arteriogram demonstrates a conventional branching pattern. Superior mesenteric arteriogram failed to delineate any retrograde contribution to the gastroduodenal artery. Selective proper hepatic arteriogram demonstrates a markedly diminutive and irregular appearing gastroduodenal artery which was confirmed with selective gastroduodenal arteriogram. Ultimately successful percutaneous coil embolization of the gastroduodenal artery to near the vessels origin. Note, procedure was complicated by non target embolization of a cranial subsegmental branch vessel of the splenic artery. Completion proper hepatic arteriogram and demonstrates complete occlusion of the GDA. Completion celiac arteriogram demonstrates a hypertrophied gastroepiploic artery arising from the left gastric artery without definitive contribution to the duodenal bulb. IMPRESSION: 1. Technically successful prophylactic percutaneous coil embolization of the gastroduodenal artery for acute upper GI bleed. 2. Procedure complicated by non target embolization of a cranial subsegmental branch vessel of the splenic artery secondary to mismatch of catheter sizing. This non target coil is felt to be of no clinical significance. Above findings were discussed with Dr. Lala Lund , at the time of procedure completion. Electronically Signed   By: Sandi Mariscal M.D.   On: 09/07/2015 18:09   Dg Chest Port 1 View  09/04/2015  CLINICAL DATA:  Central line placement. EXAM: PORTABLE CHEST 1 VIEW COMPARISON:  08/29/2015 FINDINGS: Endotracheal tube terminates 6 cm above the carina. Right internal jugular approach central venous catheter seen with tip at the expected location of superior vena cava. Cardiomediastinal silhouette is enlarged, likely exaggerated by portable technique and rotation. Mediastinal contours appear intact. There is no evidence of focal airspace consolidation, pleural effusion or pneumothorax. Lung volumes are low. Osseous structures are without acute  abnormality. Soft tissues are grossly normal. IMPRESSION: Low lung volumes. Endotracheal tube 6 cm above the carina. Advancement with 2 cm may be considered. Right-sided central venous catheter in satisfactory position radiographically. Electronically Signed   By: Fidela Salisbury M.D.   On: 09/04/2015 16:06   Dg Swallowing Func-speech Pathology  09/08/2015  Objective Swallowing Evaluation: Type of Study: MBS-Modified Barium Swallow Study Patient Details Name: ANNALISE MCDIARMID MRN: [id number] Date of Birth: June 08, 1956 Today's Date: 09/08/2015 Time: SLP Start Time (ACUTE ONLY): 1032-SLP Stop Time (ACUTE ONLY): 1058 SLP Time Calculation (min) (ACUTE ONLY): 26 min Past Medical History: Past Medical History Diagnosis Date . Thyroid disease  . Hypertension  . Neck mass hospitalized 08/29/2015 . Type II diabetes mellitus ([place])  . Left kidney mass  . Anemia, chronic disease  Past Surgical History: Past Surgical History Procedure Laterality Date . No past surgeries   . Esophagogastroduodenoscopy N/A 09/04/2015   Procedure: ESOPHAGOGASTRODUODENOSCOPY (EGD);  Surgeon: Manus Gunning, MD;  Location: [facility] [facility];  Service: Gastroenterology;  Laterality: N/A; . Direct laryngoscopy N/A 09/02/2015   Procedure:  DIRECT LARYNGOSCOPY WITH BIOPSY;  Surgeon: Izora Gala, MD;  Location: [facility];  Service: ENT;  Laterality: N/A; . Esophagoscopy N/A 09/02/2015   Procedure: ESOPHAGOSCOPY;  Surgeon: Izora Gala, MD;  Location: [facility];  Service: ENT;  Laterality: N/A; . Tracheostomy tube placement N/A 09/04/2015   Procedure: TRACHEOSTOMY;  Surgeon: Izora Gala, MD;  Location: [place];  Service: ENT;  Laterality: N/A; . Direct laryngoscopy  09/04/2015   Procedure: DIRECT View LARYNGOSCOPY with cautery of of tumor;  Surgeon: Izora Gala, MD;  Location: [facility];  Service: ENT;; HPI: Pt is 60 y.o. female with h/o hypertension and NIDDM. Pt presented to ED with voice changes, difficult swallowing and abdominal discomfort that have progressed over the  last month. 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 MBS 1/21 recommended Dys 1, pudding liquids. GI bleed, made NPO, swallow assessment re-ordered. SLP recommended repeat MBS  Subjective: pt alert, eager for food/drink Assessment / Plan / Recommendation CHL IP CLINICAL IMPRESSIONS 09/08/2015 Therapy Diagnosis Moderate pharyngeal phase dysphagia;Severe pharyngeal phase dysphagia Clinical Impression Pt's swallow function decreased from initial MBS 1/21. Swallow function assessed without speaking valve due to poor tolerance during yesterday's treatment session. She exhibited moderate-severe anatomically based pharyngeal dysphagia resulting in sensory and motor deficits. Delayed initiation of swallow and poor base of tongue retraction lead to moderate vallecular residue that was observed throughout the session. Aspiration was noted of both puree and honey thick liquids and pt did not sense aspirated bolus. Moderate verbal and visual cues to cough and swallow multiple times ineffective although finger occlusion of trach increased pressure which helped to significantly remove penetrates. 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 Recommend NPO except for free water protocol under strict guidelines.   Impact on safety and function Severe aspiration risk;Risk for inadequate nutrition/hydration   CHL IP TREATMENT RECOMMENDATION 09/08/2015 Treatment Recommendations Therapy as outlined in treatment plan below   Prognosis 09/08/2015 Prognosis for Safe Diet Advancement Guarded Barriers to Reach Goals Severity of deficits Barriers/Prognosis Comment -- CHL IP DIET RECOMMENDATION 09/08/2015 SLP Diet Recommendations NPO Liquid Administration via -- Medication Administration Via alternative means Compensations -- Postural Changes --   CHL IP OTHER RECOMMENDATIONS 09/08/2015 Recommended Consults -- Oral Care Recommendations Oral care  QID Other Recommendations --   CHL IP FOLLOW UP RECOMMENDATIONS 09/08/2015 Follow up Recommendations 24 hour supervision/assistance   CHL IP FREQUENCY AND DURATION 09/08/2015 Speech Therapy Frequency (ACUTE ONLY) min 2x/week Treatment Duration 2 weeks      CHL IP ORAL PHASE 09/08/2015 Oral Phase WFL Oral - Pudding Teaspoon -- Oral - Pudding Cup -- Oral - Honey Teaspoon -- Oral - Honey Cup -- Oral - Nectar Teaspoon -- Oral - Nectar Cup -- Oral - Nectar Straw -- Oral - Thin Teaspoon -- Oral - Thin Cup -- Oral - Thin Straw -- Oral - Puree -- Oral - Mech Soft -- Oral - Regular -- Oral - Multi-Consistency -- Oral - Pill -- Oral Phase - Comment --  CHL IP PHARYNGEAL PHASE 09/08/2015 Pharyngeal Phase Impaired Pharyngeal- Pudding Teaspoon NT Pharyngeal Material enters airway, CONTACTS cords and not ejected out;Material enters airway, passes BELOW cords without attempt by patient to eject out (silent aspiration) Pharyngeal- Pudding Cup -- Pharyngeal -- Pharyngeal- Honey Teaspoon Delayed swallow initiation-vallecula;Reduced anterior laryngeal mobility;Reduced airway/laryngeal closure;Reduced laryngeal elevation;Reduced tongue base retraction;Penetration/Apiration after swallow;Pharyngeal residue - valleculae Pharyngeal Material enters airway, CONTACTS cords and not ejected out Pharyngeal- Honey Cup Delayed  swallow initiation-vallecula;Reduced anterior laryngeal mobility;Reduced laryngeal elevation;Reduced airway/laryngeal closure;Reduced tongue base retraction;Penetration/Apiration after swallow;Moderate aspiration;Pharyngeal residue - valleculae;Compensatory strategies attempted (with notebox) Pharyngeal Material enters airway, passes BELOW cords without attempt by patient to eject out (silent aspiration) Pharyngeal- Nectar Teaspoon NT Pharyngeal -- Pharyngeal- Nectar Cup -- Pharyngeal -- Pharyngeal- Nectar Straw -- Pharyngeal -- Pharyngeal- Thin Teaspoon NT Pharyngeal -- Pharyngeal- Thin Cup -- Pharyngeal -- Pharyngeal- Thin  Straw NT Pharyngeal -- Pharyngeal- Puree Delayed swallow initiation-vallecula;Pharyngeal residue - valleculae;Penetration/Apiration after swallow;Reduced anterior laryngeal mobility;Reduced airway/laryngeal closure;Reduced tongue base retraction Pharyngeal Material enters airway, passes BELOW cords without attempt by patient to eject out (silent aspiration) Pharyngeal- Mechanical Soft -- Pharyngeal -- Pharyngeal- Regular -- Pharyngeal -- Pharyngeal- Multi-consistency -- Pharyngeal -- Pharyngeal- Pill -- Pharyngeal -- Pharyngeal Comment --  CHL IP CERVICAL ESOPHAGEAL PHASE 09/08/2015 Cervical Esophageal Phase WFL Pudding Teaspoon -- Pudding Cup -- Honey Teaspoon -- Honey Cup -- Nectar Teaspoon -- Nectar Cup -- Nectar Straw -- Thin Teaspoon -- Thin Cup -- Thin Straw -- Puree -- Mechanical Soft -- Regular -- Multi-consistency -- Pill -- Cervical Esophageal Comment -- No flowsheet data found. [place] Siren 09/08/2015, 2:11 PM Orbie Pyo Colvin Caroli.Ed CCC-SLP Pager [phone]              Dg Swallowing Func-speech Pathology  09/03/2015  Objective Swallowing Evaluation:   Patient Details Name: BRIANNAH LONA MRN: [id number] Date of Birth: [birth year] Today's Date: 09/03/2015 Time: SLP Start Time (ACUTE ONLY): 1124-SLP Stop Time (ACUTE ONLY): 1143 SLP Time Calculation (min) (ACUTE ONLY): 19 min Past Medical History: Past Medical History Diagnosis Date . Thyroid disease  . Hypertension  . Neck mass hospitalized 08/29/2015 . Type II diabetes mellitus ([place])  . Left kidney mass  . Anemia, chronic disease  Past Surgical History: Past Surgical History Procedure Laterality Date . No past surgeries   HPI: Pt is 60 y.o. female with h/o hypertension and NIDDM. Pt presented to ED with voice changes, difficult swallowing and abdominal discomfort that have progressed over the last month. 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 Subjective: pt alert, eager for food/drink Assessment  / Plan / Recommendation CHL IP CLINICAL IMPRESSIONS 09/03/2015 Therapy Diagnosis Severe pharyngeal phase dysphagia Clinical Impression Pt has a severe pharyngeal dysphaghia secondary to presence of supraglottic mass, which impedes bolus flow and decreased airway closure. All consistencies enter the airway during the swallow even with tsp-sized boluses. Aspiration is usually sensed, but she is not able to expel aspirates from her trachea. A chin tuck paired with an immediate cough facilitates clearance through the pharynx and clearance of penetrates with all consistencies tested; however, all liquids are then re-penetrated and aspirated after the swallow. Purees do not re-enter the airway, although aspiration risk remains due to residue. Recommend Dys 1 diet and pudding thick liquids with a chin tuck and immediate cough following all bites. Impact on safety and function Moderate aspiration risk;Risk for inadequate nutrition/hydration   CHL IP TREATMENT RECOMMENDATION 09/03/2015 Treatment Recommendations Therapy as outlined in treatment plan below   Prognosis 09/03/2015 Prognosis for Safe Diet Advancement Guarded Barriers to Reach Goals -- Barriers/Prognosis Comment -- CHL IP DIET RECOMMENDATION 09/03/2015 SLP Diet Recommendations Dysphagia 1 (Puree) solids;Pudding thick liquid Liquid Administration via Spoon Medication Administration Crushed with puree Compensations Slow rate;Small sips/bites;Chin tuck;Hard cough after swallow Postural Changes Remain semi-upright after after feeds/meals (Comment);Seated upright at 90 degrees   CHL IP OTHER RECOMMENDATIONS 09/03/2015 Recommended Consults -- Oral Care Recommendations  Oral care BID Other Recommendations Order thickener from pharmacy;Prohibited food (jello, ice cream, thin soups);Remove water pitcher   CHL IP FOLLOW UP RECOMMENDATIONS 09/03/2015 Follow up Recommendations Outpatient SLP;24 hour supervision/assistance   CHL IP FREQUENCY AND DURATION 09/03/2015 Speech Therapy  Frequency (ACUTE ONLY) min 2x/week Treatment Duration 2 weeks      CHL IP ORAL PHASE 09/03/2015 Oral Phase WFL Oral - Pudding Teaspoon -- Oral - Pudding Cup -- Oral - Honey Teaspoon -- Oral - Honey Cup -- Oral - Nectar Teaspoon -- Oral - Nectar Cup -- Oral - Nectar Straw -- Oral - Thin Teaspoon -- Oral - Thin Cup -- Oral - Thin Straw -- Oral - Puree -- Oral - Mech Soft -- Oral - Regular -- Oral - Multi-Consistency -- Oral - Pill -- Oral Phase - Comment --  CHL IP PHARYNGEAL PHASE 09/03/2015 Pharyngeal Phase Impaired Pharyngeal- Pudding Teaspoon -- Pharyngeal -- Pharyngeal- Pudding Cup -- Pharyngeal -- Pharyngeal- Honey Teaspoon Reduced airway/laryngeal closure;Penetration/Aspiration during swallow;Penetration/Apiration after swallow;Pharyngeal residue - pyriform;Pharyngeal residue - posterior pharnyx;Compensatory strategies attempted (with notebox) Pharyngeal Material enters airway, passes BELOW cords and not ejected out despite cough attempt by patient Pharyngeal- Honey Cup -- Pharyngeal -- Pharyngeal- Nectar Teaspoon Reduced airway/laryngeal closure;Penetration/Aspiration during swallow;Penetration/Apiration after swallow;Pharyngeal residue - pyriform;Pharyngeal residue - posterior pharnyx;Compensatory strategies attempted (with notebox) Pharyngeal Material enters airway, passes BELOW cords and not ejected out despite cough attempt by patient Pharyngeal- Nectar Cup -- Pharyngeal -- Pharyngeal- Nectar Straw -- Pharyngeal -- Pharyngeal- Thin Teaspoon Reduced airway/laryngeal closure;Penetration/Aspiration during swallow;Penetration/Apiration after swallow;Pharyngeal residue - pyriform;Pharyngeal residue - posterior pharnyx;Compensatory strategies attempted (with notebox) Pharyngeal Material enters airway, passes BELOW cords and not ejected out despite cough attempt by patient Pharyngeal- Thin Cup -- Pharyngeal -- Pharyngeal- Thin Straw Reduced airway/laryngeal closure;Penetration/Aspiration during  swallow;Penetration/Apiration after swallow;Pharyngeal residue - pyriform;Pharyngeal residue - posterior pharnyx;Compensatory strategies attempted (with notebox) Pharyngeal Material enters airway, passes BELOW cords and not ejected out despite cough attempt by patient Pharyngeal- Puree Reduced airway/laryngeal closure;Penetration/Aspiration during swallow;Compensatory strategies attempted (with notebox);Pharyngeal residue - posterior pharnyx Pharyngeal Material enters airway, remains ABOVE vocal cords and not ejected out Pharyngeal- Mechanical Soft -- Pharyngeal -- Pharyngeal- Regular -- Pharyngeal -- Pharyngeal- Multi-consistency -- Pharyngeal -- Pharyngeal- Pill -- Pharyngeal -- Pharyngeal Comment --  CHL IP CERVICAL ESOPHAGEAL PHASE 09/03/2015 Cervical Esophageal Phase (No Data) Pudding Teaspoon -- Pudding Cup -- Honey Teaspoon -- Honey Cup -- Nectar Teaspoon -- Nectar Cup -- Nectar Straw -- Thin Teaspoon -- Thin Cup -- Thin Straw -- Puree -- Mechanical Soft -- Regular -- Multi-consistency -- Pill -- Cervical Esophageal Comment -- No flowsheet data found. Germain Osgood, M.A. CCC-SLP [phone] Germain Osgood 09/03/2015, 1:33 PM              [place] [facility] [employer] Guide [employer]  09/07/2015  INDICATION: History of duodenal ulcer now with acute upper GI bleed. Please perform mesenteric arteriogram and potential percutaneous coil embolization. EXAM: 1. ULTRASOUND GUIDANCE FOR ARTERIAL ACCESS. 2. SELECTIVE CELIAC AND SUPERIOR MESENTERIC ARTERIOGRAMS 3. SELECTED PROPER HEPATIC ARTERIOGRAM 4. SELECTIVE GASTRODUODENAL ARTERIOGRAM AND PERCUTANEOUS COIL EMBOLIZATION MEDICATIONS: None ANESTHESIA/SEDATION: Fentanyl 75 mcg IV; Versed 1.5 mg IV Moderate Sedation Time: 65 minutes; The patient was continuously monitored during the procedure by the interventional radiology nurse under my direct supervision. CONTRAST:  63m OMNIPAQUE IOHEXOL 300 MG/ML  SOLN FLUOROSCOPY TIME:  Fluoroscopy Time: 18  minutes 24 seconds (1595 mGy). COMPLICATIONS: SIR Level A - No therapy, no consequence. The procedure was complicated by  non targeted embolization of a a 2 mm x 4 mm non fiber interlock coil within a cranial subsegmental branch vessel of the splenic artery. PROCEDURE: Informed consent was obtained from the patient following explanation of the procedure, risks, benefits and alternatives. The patient understands, agrees and consents for the procedure. All questions were addressed. A time out was performed prior to the initiation of the procedure. Maximal barrier sterile technique utilized including caps, mask, sterile gowns, sterile gloves, large sterile drape, hand hygiene, and Betadine prep. The right femoral head was marked fluoroscopically. Under ultrasound guidance, the right common femoral artery was accessed with a micropuncture kit after the overlying soft tissues were anesthetized with 1% lidocaine. An ultrasound image was saved for documentation purposes. The micropuncture sheath was exchanged for a 5 [place] vascular sheath over a Bentson wire. A closure arteriogram was performed through the side of the sheath confirming access within the right common femoral artery. Over a Bentson wire, a Mickelson catheter was advanced to the level of the thoracic aorta where it was back bled and flushed. The catheter was then utilized to select the celiac artery and a selective celiac arteriogram was performed. With the use of a Fathom micro wire, a regular Renegade micro catheter was utilized to select the proper hepatic artery and a selective proper hepatic arteriogram was performed. The microcatheter was then utilized to select the gastroduodenal artery and a selective gastroduodenal arteriogram was performed. The micro catheter was advanced further into the distal aspect of the gastroduodenal artery. Following sub selective injection, a 2 mm x 4 mm non fibered interlock coil was attempted to be deployed within the  distal aspect of the gastroduodenal artery however it became apparent that the micro catheter I was given was NOT a regular sized catheter but rather was a high-flow catheter. As such, coil became entrapped within the distal aspect of the micro catheter. With some manipulation, the coil was ultimately safely deployed within a cranial subsegmental branch of the splenic artery. The incorrect the microcatheter was discarded and a regular Renegade micro catheter was again utilized to select the gastroduodenal artery. Sub selective injection was performed and the gastroduodenal artery was percutaneously coil embolized with multiple overlapping 2 mm, 3 mm and 4 mm interlock fibered and non-fibered coils to near the vessel's origin. A completion proper hepatic arteriogram was performed. The micro catheter was removed and completion celiac and superior mesenteric arteriograms were performed. Images reviewed and the procedure was terminated. All wires, catheters and sheaths were removed from the patient. Hemostasis was achieved at the right groin access site with deployment of an Exoseal closure device. A dressing was placed. The patient tolerated the procedure well without immediate postprocedural complication. FINDINGS: Celiac arteriogram demonstrates a conventional branching pattern. Superior mesenteric arteriogram failed to delineate any retrograde contribution to the gastroduodenal artery. Selective proper hepatic arteriogram demonstrates a markedly diminutive and irregular appearing gastroduodenal artery which was confirmed with selective gastroduodenal arteriogram. Ultimately successful percutaneous coil embolization of the gastroduodenal artery to near the vessels origin. Note, procedure was complicated by non target embolization of a cranial subsegmental branch vessel of the splenic artery. Completion proper hepatic arteriogram and demonstrates complete occlusion of the GDA. Completion celiac arteriogram  demonstrates a hypertrophied gastroepiploic artery arising from the left gastric artery without definitive contribution to the duodenal bulb. IMPRESSION: 1. Technically successful prophylactic percutaneous coil embolization of the gastroduodenal artery for acute upper GI bleed. 2. Procedure complicated by non target embolization of a cranial subsegmental branch vessel of  the splenic artery secondary to mismatch of catheter sizing. This non target coil is felt to be of no clinical significance. Above findings were discussed with Dr. Lala Lund , at the time of procedure completion. Electronically Signed   By: Sandi Mariscal M.D.   On: 09/07/2015 18:09    Signature  Thurnell Lose M.D on 09/09/2015 at 9:57 AM  Between 7am to 7pm - Pager - [phone], After 7pm go to [email or web address] - password [place]  [phone]   LOS: 10 days

## 2015-09-09 NOTE — Progress Notes (Signed)
Speech Language Pathology Treatment: Nada Boozer Speaking valve  Patient Details Name: AYUSHI HOLZEM MRN: [id number] DOB: Apr 06, 1956 Today's Date: 09/09/2015 Time: 1137-1206 SLP Time Calculation (min) (ACUTE ONLY): 29 min  Assessment / Plan / Recommendation Clinical Impression  SLP provided skilled observation and education on Passy-Muir Speaking Valve use. Pt able to expectorate secretions with increased pressure provided by speaking valve. PMSV was placed for 25 minutes during session and SpO2, RR and HR remained WFL. Valve donned for increased amount of time with adequate exhalation and no evidence of back pressure. Vocal intensity and quality mildly improved from previous session, likely due to trach downsize, increasing expelled air to upper airway. Pt and daughter educated re: PMSV precautions and use. Daughter educated/trained re: PMSV placement and removal of valve, able to demonstrate x2 without difficulty. Recommend pt can wear PMSV during waking hours with full supervision with continued SLP f/u for tolerance.   HPI HPI: Pt is 60 y.o. female with h/o hypertension and NIDDM. Pt presented to ED with voice changes, difficult swallowing and abdominal discomfort that have progressed over the last month. 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 MBS 1/21 recommended Dys 1, pudding liquids. GI bleed, made NPO, swallow assessment re-ordered. SLP recommended repeat MBS       SLP Plan  Continue with current plan of care     Recommendations  Diet recommendations: NPO Medication Administration: Via alternative means      Patient may use Passy-Muir Speech Valve: During all waking hours (remove during sleep) PMSV Supervision: Full      Oral Care Recommendations: Oral care QID Follow up Recommendations: 24 hour supervision/assistance Plan: Continue with current plan of care     GO                Kimiyo Carmicheal 09/09/2015, 1:47  PM  Titus Mould, Student-SLP

## 2015-09-09 NOTE — Progress Notes (Signed)
Nutrition Follow-up  DOCUMENTATION CODES:   Morbid obesity  INTERVENTION:   -TPN management per pharmacy  NUTRITION DIAGNOSIS:   Inadequate oral intake related to inability to eat as evidenced by NPO status.  Ongoing  GOAL:   Patient will meet greater than or equal to 90% of their needs  Met with TPN  MONITOR:   Labs, Weight trends, I & O's  REASON FOR ASSESSMENT:   Consult New TPN/TNA  ASSESSMENT:   60yo female with hx HTN, DM who initially presented 1/16 with hoarseness and difficulty swallowing. 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 She was seen in consultation by ENT and underwent tracheostomy placement 1/22. Course was c/b GI bleed. EGD performed on 1/22 revealed large duodenal ulcer which was actively bleeding and was clipped. She remains in ICU post EGD and trach placement.  Pt transferred from SDU to medical floor on 09/08/15.   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 Per ONT, if pt desires laryngopharyngectomy with flap reconstruction, this will need to be done in a [facility] setting. However, pt desires to pursue radiation treatments  Pt underwent MBSS on 09/08/15, which revealed severe aspiration risk with recommendations for continued NPO status.  Pt underwent mesenteric arteriogram and percutaneous coil embolization of the GDA by IR on 09/07/15.   Pt remains on TPN. Plan for long term is for pt to get PEG once ulcers improve. Per pharmacy note, plan is to continue Clinimix-E 5/15 to 94m/hr and 20% IVFE @ 10 mL/hr. This will provide ~ 100 gm of protein (~ 95% of goal) and 1894 kCal (100% of goal).   Labs reviewed: CBGS: 120-159.  Diet Order:  Diet NPO time specified Except for: Sips with Meds TPN (CLINIMIX-E) Adult TPN (CLINIMIX-E) Adult  Skin:  Reviewed, no issues  Last BM:  09/07/15  Height:   Ht Readings from Last 1  Encounters:  08/29/15 _0  (1.6 m)    Weight:   Wt Readings from Last 1 Encounters:  08/29/15 250 lb (113.399 kg)    Ideal Body Weight:  52.3 kg  BMI:  Body mass index is 44.3 kg/(m^2).  Estimated Nutritional Needs:   Kcal:  1700-2000  Protein:  105-120 gm  Fluid:  1.7-2 L  EDUCATION NEEDS:   No education needs identified at this time  Ashley Ramsey RD, LDN, CDE Pager: [phone] hours Pager: [phone]

## 2015-09-09 NOTE — Progress Notes (Signed)
Ashley Ramsey is sleeping and no family at bedside. She has been fairly resistant to discussing her disease and GOC with me thus far. Recommend outpatient f/u with palliative care via [facility] - unsure if she can get this without insurance?? IF she is willing to have follow up.   Vinie Sill, NP Palliative Medicine Team Pager # [phone] (M-F 8a-5p) Team Phone # [phone] (Nights/Weekends)

## 2015-09-09 NOTE — Progress Notes (Addendum)
Patient with new trach transferred to 5W last night. Spoke with Dr Reynaldo Minium, medical advisor about disposition for this case. Due to patient requiring charity care patient will be candidate for SNF, LTACH, Residential Hospice, or Home Hospice through [place] if her plan for care is to pursue comfort measures without chemo or radiation. If patient plans to persue treatment with chemo or radiation, patient will discharge to home health with charity care, and TPN provided through [facility] with [facility].  Spoke to patient at the bedside. Patient communicated through writing that her plan is to go home, and have radiation but not chemo. Patient informed of [facility] providing charity care and she agreed to their services after discharge. Spoke with Jonelle Sidle, inpatient liaison at [facility]. In order to assure safe discharge with all equipment and providers available upon arrival home [facility] needs 24 hours notice, and does not accept new trach patients over the weekend.  [facility] able to provide RN and trach care, however patient does not qualify for [facility] PT SLP without insurance coverage.  Orders placed for trach supplies and education by RT for trach care with family prior to DC. Pt may need order for Oxygen prior to discharge.

## 2015-09-10 DIAGNOSIS — K254 Chronic or unspecified gastric ulcer with hemorrhage: Secondary | ICD-10-CM

## 2015-09-10 LAB — GLUCOSE, CAPILLARY
GLUCOSE-CAPILLARY: 137 mg/dL — AB (ref 65–99)
GLUCOSE-CAPILLARY: 142 mg/dL — AB (ref 65–99)
GLUCOSE-CAPILLARY: 160 mg/dL — AB (ref 65–99)
Glucose-Capillary: 125 mg/dL — ABNORMAL HIGH (ref 65–99)
Glucose-Capillary: 138 mg/dL — ABNORMAL HIGH (ref 65–99)
Glucose-Capillary: 150 mg/dL — ABNORMAL HIGH (ref 65–99)

## 2015-09-10 LAB — BASIC METABOLIC PANEL
Anion gap: 5 (ref 5–15)
BUN: 13 mg/dL (ref 6–20)
CHLORIDE: 103 mmol/L (ref 101–111)
CO2: 28 mmol/L (ref 22–32)
CREATININE: 0.7 mg/dL (ref 0.44–1.00)
Calcium: 8.1 mg/dL — ABNORMAL LOW (ref 8.9–10.3)
GFR calc non Af Amer: >60 mL/min (ref >60)
Glucose, Bld: 161 mg/dL — ABNORMAL HIGH (ref 65–99)
Potassium: 4 mmol/L (ref 3.5–5.1)
Sodium: 136 mmol/L (ref 135–145)

## 2015-09-10 MED ORDER — TRACE MINERALS CR-CU-MN-SE-ZN 10-1000-500-60 MCG/ML IV SOLN
INTRAVENOUS | Status: AC
  Administered 2015-09-10: 18:00:00 via INTRAVENOUS
  Filled 2015-09-10: qty 1992

## 2015-09-10 MED ORDER — FUROSEMIDE 20 MG PO TABS: 20.0000 mg | ORAL_TABLET | Freq: Every day | ORAL | Status: DC

## 2015-09-10 MED ORDER — FUROSEMIDE 10 MG/ML IJ SOLN
10.0000 mg | Freq: Every day | INTRAMUSCULAR | Status: DC
  Administered 2015-09-11 – 2015-09-13 (×3): 10 mg via INTRAVENOUS
  Filled 2015-09-10 (×3): qty 2

## 2015-09-10 MED ORDER — FAT EMULSION 20 % IV EMUL
240.0000 mL | INTRAVENOUS | Status: AC
  Administered 2015-09-10: 240 mL via INTRAVENOUS
  Filled 2015-09-10: qty 250

## 2015-09-10 MED ORDER — FUROSEMIDE 10 MG/ML IJ SOLN
40.0000 mg | Freq: Once | INTRAMUSCULAR | Status: AC
  Administered 2015-09-10: 40 mg via INTRAVENOUS
  Filled 2015-09-10: qty 4

## 2015-09-10 NOTE — Progress Notes (Signed)
Patient has been transferred from [facility] to [place].. Prior d/c plan was to seek a difficult to place bed in a SNF due to TPN and Trach. CSW notified by Nathaniel Man, Assistant Director of [place] services that he was checking on this.   LTAC was considered but patient does not have any insurance. Discussed with Henrietta, RNCM to determine if possible uninsured bed could be optioned but this was not possible.  Note from [facility] on 5W indicates that patient will d/c home with home health.  Will follow up with unit CSW to insure that this is correct d/c plan for patient and if so- then sign off.  Lorie Phenix. Pauline Good, [place]

## 2015-09-10 NOTE — Progress Notes (Signed)
IR asked to consider placement in [place] gastrostomy tube in the future. Pt known to IR service, recent GDA coil embolization for GI bleed secondary to gastric and duodenal ulcers. Pt with laryngeal cancer and likely renal cancer. Has failed swallow study and also has Therapist, nutritional. Will be getting TPN via PICC for several weeks as ulcers heal but may ultimately need PEG/G-tube if she remains stable/improves.  D/w Dr. Annamaria Boots, IR feels PEG placed under direct visualization by GI would be better, to avoid potential placement through gastric ulcer, which could not be seen with IR technique.   Ascencion Dike PA-C Interventional Radiology 09/10/2015 9:14 AM

## 2015-09-10 NOTE — Progress Notes (Signed)
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   No Known Allergies  Patient Measurements: Height: 5\' 3"  (160 cm) Weight: 250 lb (113.399 kg) IBW/kg (Calculated) : 52.4 Adjusted Body Weight: 67 kg  Usual Weight: 113.4 BMI = 44   Vital Signs: Temp: 98.1 F (36.7 C) (01/28 0705) Temp Source: Oral (01/28 0705) BP: 133/52 mmHg (01/28 0705) Pulse Rate: 134 (01/28 0705) Intake/Output from previous day: 01/27 0701 - 01/28 0700 In: -  Out: 949 [Urine:949] Intake/Output from this shift:    Labs:  Recent Labs  09/08/15 0040 09/08/15 0655 09/09/15 0430  WBC  --  13.3* 12.7*  HGB 8.6* 8.3* 8.2*  HCT 26.6* 25.6* 24.6*  PLT  --  114* 142*     Recent Labs  09/08/15 0500 09/09/15 0430 09/10/15 0459  NA 139 139 136  K 3.5 3.6 4.0  CL 104 105 103  CO2 28 28 28   GLUCOSE 147* 134* 161*  BUN 9 10 13   CREATININE 0.71 0.62 0.70  CALCIUM 7.7* 7.7* 8.1*  MG 1.6* 1.8  --   PHOS 3.4  --   --   PROT 4.8*  --   --   ALBUMIN 1.7*  --   --   AST 15  --   --   ALT 12*  --   --   ALKPHOS 61  --   --   BILITOT 0.2*  --   --    Estimated Creatinine Clearance: 91.8 mL/min (by C-G formula based on Cr of 0.7).    Recent Labs  09/10/15 0015 09/10/15 0634 09/10/15 0738  GLUCAP 125* 142* 137*    Insulin Requirements in the past 24 hours:  3 units SSI   Assessment: 54 YOF who presented with worsening hoarseness of voice and abdominal pain. She had difficulty swallowing foods, N/V, weight loss and no appetite. CT neck found laryngeal mass and CT abdmonen revealed a left renal mass. She was also found to have a UTI with SIRS. Admitted for IV antibiotics. Underwent a biopsy by ENT on 1/20. Hospital course also complicated by development of lower GI bleeding and associated blood loss anemia   Surgeries/Procedures:  GI: S/p upper GI bleed on 09/04/15 requiring multiple units of PRBC. GI/ENT consulted.  Tracheostomy placement by ENT on 1/23. She will be nothing by mouth for a while and PEG tube not possible due to likely stomach and duodenal ulcers. On PPI drip. Prealbumin 8.7 09/07/15: gastroduodenal artery was coiled/embolized by IR. No further bleeding since Endo: H/o DM, CBGs 120-137. On SSI  Lytes: K 4, CorrCa 9.5, Mg 1.8, Phos 3.4 Renal: SCr 0.62,  LR @ 10 mL/hr  UOP 0.9 ml/kg/hr  Pulm: Trach collar; 28% FiO2 on 5L  Cards: H/o HTN, BP 133/52 Hepatobil: LFTs not elevated  Neuro: Probable head and neck cancer. Per oncology, she is poor candidate for chemotherapy and most likely has renal cancer. Prognosis is extremely. Oncology recommends palliative radiation treatments and palliative care consult. On folic acid and thiamine  Heme: Hgb down to 8.2, Plt 142k ID: WBC 13.3>>12.7 ceftriaxone for E.coli UTI 1/17>>1/24 Best Practices: PPI, SCDs  TPN Access: PICC line 1/23>> TPN start date: 1/23>>   Current Nutrition:  Clinimix-E 5/15 83 mL/hr and 20% IVFE @ 10 mL/hr - at goal   Nutritional Goals:  Kcal: 1700-2000 Protein: 105-120 gm Fluid: 1.7-2 L  Plan:  - Continue Clinimix-E 5/15 to  4mL/hr and  20% IVFE @ 10 mL/hr. This will provide ~ 100 gm of protein (~ 95% of goal) and 1894  kCal (100% of goal).  - Monitor CBGs as TPN is advanced. No insulin in TPN - Continue MVI and TE in TPN bag  - IV folic acid and thiamine added to TPN daily - Will likely start cycling TPN tomorrow if labs are stable, since plan is to likely send home on TPN. Will discuss with MD  Albertina Parr, PharmD., BCPS Clinical Pharmacist Phone [phone]

## 2015-09-10 NOTE — Progress Notes (Signed)
[facility]  Patient Status:   New pt for [facility] this admission  [facility] is providing the following services: HHRN, RT team and [place] team for home TPN. [facility] will provide in hospital teaching with pt and caregiver regarding POC for home regarding trach as well as home TPN program.  I attempted to met with pt around 7 PM Friday night but pt sleeping soundly. Will initiate home TPN teaching with pt on Monday to plan for DC home.  NOTE: I spoke with Philipp Ovens in [place] who states pt has appointment in her room on Monday, 6/55/37 for MCD Application completion.   If patient discharges after hours, please call [phone].   [name] 09/10/2015, 9:57 AM

## 2015-09-10 NOTE — Progress Notes (Signed)
PATIENT DETAILS Name: Ashley GUILBERT Age: 60 y.o. Sex: female Date of Birth: [birth year] Admit Date: 08/29/2015 Admitting Physician [name], MD QIW:[id number], [name], MD  Brief narrative:  60 year old female with past medical history of hypertension, type 2 diabetes presented to the ED with worsening hoarseness of voice and abdominal pain. Upon further evaluation with a CT neck she was found to have a laryngeal mask, CT abdomen revealed a left renal mass. She was also found to have UTI with SIRS. She was admitted and started on IV antibiotics, ENT was consulted-patient underwent a biopsy on 1/20. Hospital course has been complicated by development of lower GI bleeding and associated blood loss anemia.   She had an episode of hematemesis on 09/04/2015 after with GI was reconsulted, ENT was called as well, she is post tracheostomy on 09/04/2015.  Subjective:  Patient in bed, currently no chest abdominal pain, no shortness of breath. Feels better. No subjective complaints this morning.  Assessment/Plan:   Metastatic Invasive Sq Cell Laryngeal CA : ENT & oncology consulted, underwent laryngoscopy with biopsy on 1/20 confirming the malignancy.   She underwent tracheostomy placement by ENT on 09/05/2015 to secure her airway. Trach care and speech following along with ENT Dr Constance Holster.  Oncology consulted, I had detailed discussions with oncologist Dr. Alvy Bimler on 09/04/2015. Patient's prognosis is extremely poor, poor candidate for chemotherapy, also most likely has renal cancer. She recommends palliative radiation treatments and consultation with palliative care. Discussed with ENT Dr Constance Holster on 09/08/2015. Outpatient follow-up with [facility] once acute issues have resolved per ENT may be in the next 1-2 weeks.  No PEG tube as she has possible stomach and duodenal ulcers. Now has a PICC line for TNA. Speech to reevaluate For both PMV and swallow eval. Continue with  speech and ENT on 09/07/2015.   Acute blood loss related anemia due to UGI bleed. 5 units of packed RBC transfusion on 09/04/2015, H&H now stable, GI bleed due to likely proximal stomach and duodenal ulcer, status post EGD on 09/04/2015, on 09-07-15 @ 1pm - 1 more dark stool, continue IV PPI continue, GI informed, IR called, her Gastroduodenal Artery was coiled/embolized by IR on 09-07-15, H&H stable, transfuse if HB<7.5.   Left renal mass: Highly suspicious for renal cell carcinoma. Doubt it is related to above. Spoke with Dr. Ree Kida MD on 1/17,he reviewed patient's CT scan in chart - recommends outpatient follow-up with him in the office for consideration of nephrectomy in the future.  However patient will have a laryngeal mass addressed first with palliative radiation treatments, currently too weak for chemotherapy and nephrectomy.    Dysphagia: Secondary to known laryngeal mass. Speech therapy following-completed MBS-recommendations are for dysphagia 1 diet. Await arrival of family-will discuss with patient/family regarding risks of aspiration. She will most likely may require PEG tube in the future once GI Ulcers are better by GI under direct visualization, IR also suggested that GI placed the PEG tube. DW Dr Collene Mares GI 09-07-15.   E coli pyelonephritis with SIRS: Sensitivities noted, finished Rocephin.  Afebrile.  Acute renal failure: Likely prerenal azotemia, resolved after hydration.  Hypertension: Controlled on PRN Hydralazine.   Tobacco & Alcohol abuse: Counseled, completed Ativan per CIWA protocol-no signs of withdrawal  Left adrenal gland nodule: Will need outpatient follow-up, especially given left adrenal mass.  Subpleural nodules in the right middle lobe of right lung and left lower lobe  of left lung: Will need to repeat CT chest in 6 months. Stable on room air, no shortness of breath  Possible CAD: Atherosclerosis seen CT chest. Unfortunately with ongoing lower GI  bleeding-unable to use antiplatelets. Echo with LFEV 93-57%, grade 1 diastolic dysfunction, no wall motion abnormalities. Given numerous above medical problems-not a candidate for any procedures/further workup at this point.  Morbid obesity: Has lost significant amount of weight over the past few months.  Type 2 diabetes: Controlled with CBGs 110-130s. Continue SSI.  CBG (last 3)   Recent Labs  09/10/15 0015 09/10/15 0634 09/10/15 0738  GLUCAP 125* 142* 137*     Disposition: Remains inpatient-Home in next few days  Antimicrobial agents  See below  Anti-infectives    Start     Dose/Rate Route Frequency Ordered Stop   08/29/15 2330  cefTRIAXone (ROCEPHIN) 1 g in dextrose 5 % 50 mL IVPB  Status:  Discontinued     1 g 100 mL/hr over 30 Minutes Intravenous Every 24 hours 08/29/15 2250 09/07/15 1332      DVT Prophylaxis: SCD's, no pharmacological DVT ppx given GI bleed/anemia  Code Status: Full code   Family Communication Daughter  Procedures:  ECHO- TTE  - Left ventricle: The cavity size was normal. Wall thickness was normal. Systolic function was normal. The estimated ejectionfraction was in the range of 55% to 60%. Wall motion was normal;there were no regional wall motion abnormalities. Dopplerparameters are consistent with abnormal left ventricularrelaxation (grade 1 diastolic dysfunction). Doppler parametersare consistent with high ventricular filling pressure. - Aortic valve: There was very mild stenosis. There was trivialregurgitation. - Mitral valve: Calcified annulus. Mildly thickened leaflets . - Left atrium: The atrium was mildly dilated.  Tracheostomy 09/03/2014 by ENT  PICC 09/05/2015  EGD 09/04/2015 by Dr. Havery Moros. With possible ulcers in the duodenum and proximal stomach. Blood and clots also noted.  Gastroduodenal Artery Embolization - 09-07-15   CONSULTS:   GI, urology , ent, oncology, palliative care  Time spent 30 minutes-Greater  than 50% of this time was spent in counseling, explanation of diagnosis, planning of further management, and coordination of care.  MEDICATIONS: Scheduled Meds: . Influenza vac split quadrivalent PF  0.5 mL Intramuscular Tomorrow-1000  . insulin aspart  0-9 Units Subcutaneous 4 times per day   Continuous Infusions: . Marland KitchenTPN (CLINIMIX-E) Adult 83 mL/hr at 09/09/15 1706   And  . fat emulsion 240 mL (09/09/15 1707)  . Marland KitchenTPN (CLINIMIX-E) Adult     And  . fat emulsion    . lactated ringers 1,000 mL (09/05/15 2145)  . pantoprozole (PROTONIX) infusion 8 mg/hr (09/10/15 0353)   PRN Meds:.acetaminophen, dextromethorphan, hydrALAZINE, lidocaine, LORazepam, morphine injection, [DISCONTINUED] ondansetron **OR** ondansetron (ZOFRAN) IV, oxymetazoline, RESOURCE THICKENUP CLEAR, sodium chloride flush   PHYSICAL EXAM: Vital signs in last 24 hours: Filed Vitals:   09/10/15 0428 09/10/15 0705 09/10/15 0744 09/10/15 0928  BP:  133/52    Pulse: 96 134  89  Temp:  98.1 F (36.7 C)    TempSrc:  Oral    Resp: 20 20    Height:      Weight:      SpO2: 97% 98% 98%     Weight change:  Filed Weights   08/29/15 1422  Weight: 113.399 kg (250 lb)   Body mass index is 44.3 kg/(m^2).   Gen Exam: Awake and alert with clear speech. Obese.   Neck: Supple-exam limited by body habitus. No stridor, Trach in place Chest: Good air movement bilaterally  with coarse breath sounds CVS: S1 S2 regular. Abdomen: soft, non tender Extremities: No edema, PICC line in place Neurologic: Non Focal Psych: cooperative Wounds: N/A.   Intake/Output from previous day:  Intake/Output Summary (Last 24 hours) at 09/10/15 0929 Last data filed at 09/10/15 7262  Gross per 24 hour  Intake      0 ml  Output    800 ml  Net   -800 ml     LAB RESULTS: CBC  Recent Labs Lab 09/04/15 0325  09/05/15 2202 09/06/15 0549 09/06/15 0550  09/07/15 0500 09/07/15 1300 09/07/15 1915 09/08/15 0040 09/08/15 0655 09/09/15 0430   WBC 18.1*  < > 14.9*  --  12.9*  --  12.2*  --   --   --  13.3* 12.7*  HGB 6.6*  < > 9.2*  --  8.2*  < > 8.3* 8.7* 8.4* 8.6* 8.3* 8.2*  HCT 20.7*  < > 27.5*  --  24.6*  < > 24.8* 25.6* 25.8* 26.6* 25.6* 24.6*  PLT 196  < > 113*  --  107*  --  100*  --   --   --  114* 142*  MCV 86.6  < > 83.8  --  84.5  --  87.6  --   --   --  86.8 87.2  MCH 27.6  < > 28.0  --  28.2  --  29.3  --   --   --  28.1 29.1  MCHC 31.9  < > 33.5  --  33.3  --  33.5  --   --   --  32.4 33.3  RDW 15.4  < > 15.6*  --  15.9*  --  16.3*  --   --   --  15.3 15.0  LYMPHSABS 1.3  --   --  1.8  --   --   --   --   --   --   --   --   MONOABS 0.7  --   --  0.8  --   --   --   --   --   --   --   --   EOSABS 0.1  --   --  0.2  --   --   --   --   --   --   --   --   BASOSABS 0.0  --   --  0.0  --   --   --   --   --   --   --   --   < > = values in this interval not displayed.  Chemistries   Recent Labs Lab 09/05/15 0350 09/06/15 0549 09/07/15 0500 09/08/15 0500 09/09/15 0430 09/10/15 0459  NA 145 142 136 139 139 136  K 3.8 3.3* 4.1 3.5 3.6 4.0  CL 115* 108 96* 104 105 103  CO2 _0 GLUCOSE 127* 135* 433* 147* 134* 161*  BUN 21* _1 CREATININE 0.96 0.93 0.82 0.71 0.62 0.70  CALCIUM 7.1* 7.0* 7.2* 7.7* 7.7* 8.1*  MG 1.1* 1.6* 1.9 1.6* 1.8  --     CBG:  Recent Labs Lab 09/09/15 1209 09/09/15 1800 09/10/15 0015 09/10/15 0634 09/10/15 0738  GLUCAP 120* 122* 125* 142* 137*    GFR Estimated Creatinine Clearance: 91.8 mL/min (by C-G formula based on Cr of 0.7).  Coagulation profile  Recent Labs Lab 09/04/15 1504  INR 1.34  Cardiac Enzymes No results for input(s): CKMB, TROPONINI, MYOGLOBIN in the last 168 hours.  Invalid input(s): CK  Invalid input(s): POCBNP No results for input(s): DDIMER in the last 72 hours. No results for input(s): HGBA1C in the last 72 hours. No results for input(s): CHOL, HDL, LDLCALC, TRIG, CHOLHDL, LDLDIRECT in the last 72 hours. No  results for input(s): TSH, T4TOTAL, T3FREE, THYROIDAB in the last 72 hours.  Invalid input(s): FREET3 No results for input(s): VITAMINB12, FOLATE, FERRITIN, TIBC, IRON, RETICCTPCT in the last 72 hours. No results for input(s): LIPASE, AMYLASE in the last 72 hours.  Urine Studies No results for input(s): UHGB, CRYS in the last 72 hours.  Invalid input(s): UACOL, UAPR, USPG, UPH, UTP, UGL, UKET, UBIL, UNIT, UROB, ULEU, UEPI, UWBC, URBC, UBAC, CAST, UCOM, BILUA  MICROBIOLOGY: Recent Results (from the past 240 hour(s))  Surgical pcr screen     Status: None   Collection Time: 09/01/15  4:09 AM  Result Value Ref Range Status   MRSA, PCR NEGATIVE NEGATIVE Final   Staphylococcus aureus NEGATIVE NEGATIVE Final    Comment:        The Xpert SA Assay (FDA approved for NASAL specimens in patients over 66 years of age), is one component of a comprehensive surveillance program.  Test performance has been validated by [facility] for patients greater than or equal to 52 year old. It is not intended to diagnose infection nor to guide or monitor treatment.   MRSA PCR Screening     Status: None   Collection Time: 09/04/15  7:03 PM  Result Value Ref Range Status   MRSA by PCR NEGATIVE NEGATIVE Final    Comment:        The GeneXpert MRSA Assay (FDA approved for NASAL specimens only), is one component of a comprehensive MRSA colonization surveillance program. It is not intended to diagnose MRSA infection nor to guide or monitor treatment for MRSA infections.     RADIOLOGY STUDIES/RESULTS: Dg Chest 2 View  08/29/2015  CLINICAL DATA:  One month history of chest pain and hoarse voice. EXAM: CHEST  2 VIEW COMPARISON:  None. FINDINGS: The heart is borderline and enlarged. Mild tortuosity of the thoracic aorta. Low lung volumes with mild vascular crowding and streaky basilar atelectasis. There also bronchitic changes which could be acute or chronic. No infiltrates or effusions. The bony thorax  is intact. IMPRESSION: Acute versus chronic bronchitic change.  No infiltrates or effusions Electronically Signed   By: Marijo Sanes M.D.   On: 08/29/2015 16:15   Ct Soft Tissue Neck W Contrast  08/29/2015  CLINICAL DATA:  60 year old female with abnormal full waist for 1 month. Abdominal pain. Three days of vomiting. Initial encounter. EXAM: CT NECK WITH CONTRAST TECHNIQUE: Multidetector CT imaging of the neck was performed using the standard protocol following the bolus administration of intravenous contrast. CONTRAST:  25m OMNIPAQUE IOHEXOL 300 MG/ML SOLN in conjunction with contrast enhanced imaging of the chest, abdomen, and pelvis reported separately. COMPARISON:  CT chest abdomen and pelvis from today reported separately FINDINGS: Pharynx and larynx: Bulky supraglottic laryngeal tumor with marked soft tissue enlargement of the bilateral aryepiglottic folds and anterior commissure up to 17 mm in thickness. Tumor appears inseparable from the undersurface of the strap muscles suggesting extension through the thyroid cartilage bilaterally. Posterior hypopharynx involvement. The epiglottis is thickened, nodular, and distorted. Furthermore, the vallecula is mostly effaced and soft tissue at the base of tongue appears nodular and thickened. All told, tumor encompasses 37 x  40 x 58 mm (AP by transverse by CC). The other laryngeal cartilages appear spared. The palatine tonsils, soft palate, and nasopharynx appear within normal limits. Superior parapharyngeal spaces and retropharyngeal space are within normal limits. Salivary glands: Sublingual space, submandibular glands, and parotid glands are within normal limits. Thyroid: Coarsely calcified 2.3 cm right thyroid nodule. Subcentimeter left lobe nodule. Lymph nodes: Abnormal right level 3 node measuring 9 mm at the level of the thyroid cartilage (series 1, image 57). Small but asymmetric 5 mm right level IIIa lymph node at the level of the hyoid bone on the right  (series 1, image 49). Bilateral level 2A nodes appear symmetric measuring 8-9 mm in thickness. No level 4, 5, or level 1 lymphadenopathy. Vascular: Suboptimal intravascular contrast timing, but major vascular structures in the neck and at the skullbase appear to remain patent. Left greater than right carotid bifurcation calcified atherosclerosis. Limited intracranial: Negative.  Partially empty sella. Visualized orbits: Negative. Mastoids and visualized paranasal sinuses: Visualized paranasal sinuses and mastoids are clear. Skeleton: Mostly absent dentition. Periapical lucency about the residual left mandible molar. No osseous metastatic disease identified. Upper chest: Reported separately today. IMPRESSION: 1. 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 2. Suspect metastatic nodal disease at level 3 on the right (small but asymmetric up to 9 mm nodes). Bilateral level 2 nodes are indeterminate and also measure up to 9 mm individually. No cystic or necrotic nodes in the neck. 3. See CT chest abdomen and pelvis from today reported separately. Electronically Signed   By: Genevie Ann M.D.   On: 08/29/2015 19:56   Ct Chest W Contrast  08/29/2015  CLINICAL DATA:  60 year old female with 2-3 day history of vomiting. Abdominal pain for the past month. Loss of voice. EXAM: CT CHEST, ABDOMEN, AND PELVIS WITH CONTRAST TECHNIQUE: Multidetector CT imaging of the chest, abdomen and pelvis was performed following the standard protocol during bolus administration of intravenous contrast. CONTRAST:  67m OMNIPAQUE IOHEXOL 300 MG/ML  SOLN COMPARISON:  No priors. FINDINGS: CT CHEST FINDINGS Mediastinum/Lymph Nodes: Heart size is normal. There is no significant pericardial fluid, thickening or pericardial calcification. There is atherosclerosis of the thoracic aorta, the great vessels of the mediastinum and the coronary arteries, including  calcified atherosclerotic plaque in the left main, left anterior descending, left circumflex and right coronary arteries. Calcifications of the aortic valve and mitral valve/annulus. No pathologically enlarged mediastinal or hilar lymph nodes. Esophagus is unremarkable in appearance. No axillary lymphadenopathy. Lungs/Pleura: 4 mm subpleural nodule in the lateral segment of the right middle lobe (image 31 of series 4). 4 mm subpleural nodule in the posterior left lower lobe (image 38 of series 4). No other suspicious appearing pulmonary nodules or masses. No acute consolidative airspace disease. No pleural effusions. Musculoskeletal/Soft Tissues: There are no aggressive appearing lytic or blastic lesions noted in the visualized portions of the skeleton. CT ABDOMEN AND PELVIS FINDINGS Hepatobiliary: No cystic or solid hepatic lesions. No intra or extrahepatic biliary ductal dilatation. 7 mm calcified gallstone lying dependently in the gallbladder. No current findings to suggest an acute cholecystitis at this time. Pancreas: No pancreatic mass. No pancreatic ductal dilatation. No pancreatic or peripancreatic fluid or inflammatory changes. Spleen: Unremarkable. Adrenals/Urinary Tract: In the lower pole of the left kidney there is a 5.9 x 6.1 x 6.3 cm heterogeneously enhancing lesion highly concerning for renal cell carcinoma. The inferior aspect of this lesion comes in very  close proximity to the anterior aspect of the left psoas muscle and quadratus lumborum muscle, however, there appears to be an intervening fat plane at this time. The lesion is well separated from the left renal vein. Right kidney and right adrenal gland are normal in appearance. 1.2 x 1.4 cm indeterminate nodule in the lateral limb of the left adrenal gland. No hydroureteronephrosis. Urinary bladder is largely decompressed, but otherwise unremarkable in appearance. Stomach/Bowel: Normal appearance of the stomach. No pathologic dilatation of small  bowel or colon. A few scattered colonic diverticulae are noted, without surrounding inflammatory changes to suggest an acute diverticulitis at this time. Appendix is normal. Vascular/Lymphatic: Atherosclerosis throughout the abdominal and pelvic vasculature, without evidence of aneurysm or dissection. Left renal vein is widely patent and separate from the lower pole mass. No lymphadenopathy noted in the abdomen or pelvis. Reproductive: Uterus and ovaries are unremarkable in appearance. Other: No significant volume of ascites.  No pneumoperitoneum. Musculoskeletal: There are no aggressive appearing lytic or blastic lesions noted in the visualized portions of the skeleton. IMPRESSION: 1. No acute findings in the abdomen or pelvis to account for the patient's symptoms. 2. However, there is a 5.9 x 6.1 x 6.3 cm heterogeneously enhancing mass in the lower pole of the left kidney highly concerning for renal cell carcinoma. At this time, the lesion appears likely to be encapsulated within Gerota's fascia (although it comes very close to the left psoas and quadratus lumborum musculature), does not involve the left renal vein, and does not appear to be associated with lymphadenopathy. Nonemergent Urologic consultation for surgical resection is strongly recommended in the near future. 3. 1.2 x 1.4 cm indeterminate nodule in the left adrenal gland. Attention on followup studies is recommended, as a metastatic lesion is not excluded. 4. 4 mm subpleural nodules in the right middle lobe and left lower lobe. These are highly nonspecific, and favored to represent subpleural lymph nodes, but attention on followup studies is recommended to ensure stability. 5. Cholelithiasis without evidence of acute cholecystitis at this time. 6. Atherosclerosis, including left main and 3 vessel coronary artery disease. Please note that although the presence of coronary artery calcium documents the presence of coronary artery disease, the severity  of this disease and any potential stenosis cannot be assessed on this non-gated CT examination. Assessment for potential risk factor modification, dietary therapy or pharmacologic therapy may be warranted, if clinically indicated. 7. There are calcifications of the aortic valve and mitral valve/annulus. Echocardiographic correlation for evaluation of potential valvular dysfunction may be warranted if clinically indicated. 8. Colonic diverticulosis without evidence of acute diverticulitis at this time. 9. Additional incidental findings, as above. These results were called by telephone at the time of interpretation on 08/29/2015 at 7:34 pm to Dr. Davonna Belling, who verbally acknowledged these results. Electronically Signed   By: Vinnie Langton M.D.   On: 08/29/2015 19:37   Ct Abdomen Pelvis W Contrast  08/29/2015  CLINICAL DATA:  60 year old female with 2-3 day history of vomiting. Abdominal pain for the past month. Loss of voice. EXAM: CT CHEST, ABDOMEN, AND PELVIS WITH CONTRAST TECHNIQUE: Multidetector CT imaging of the chest, abdomen and pelvis was performed following the standard protocol during bolus administration of intravenous contrast. CONTRAST:  67m OMNIPAQUE IOHEXOL 300 MG/ML  SOLN COMPARISON:  No priors. FINDINGS: CT CHEST FINDINGS Mediastinum/Lymph Nodes: Heart size is normal. There is no significant pericardial fluid, thickening or pericardial calcification. There is atherosclerosis of the thoracic aorta, the great vessels of the mediastinum  and the coronary arteries, including calcified atherosclerotic plaque in the left main, left anterior descending, left circumflex and right coronary arteries. Calcifications of the aortic valve and mitral valve/annulus. No pathologically enlarged mediastinal or hilar lymph nodes. Esophagus is unremarkable in appearance. No axillary lymphadenopathy. Lungs/Pleura: 4 mm subpleural nodule in the lateral segment of the right middle lobe (image 31 of series 4). 4  mm subpleural nodule in the posterior left lower lobe (image 38 of series 4). No other suspicious appearing pulmonary nodules or masses. No acute consolidative airspace disease. No pleural effusions. Musculoskeletal/Soft Tissues: There are no aggressive appearing lytic or blastic lesions noted in the visualized portions of the skeleton. CT ABDOMEN AND PELVIS FINDINGS Hepatobiliary: No cystic or solid hepatic lesions. No intra or extrahepatic biliary ductal dilatation. 7 mm calcified gallstone lying dependently in the gallbladder. No current findings to suggest an acute cholecystitis at this time. Pancreas: No pancreatic mass. No pancreatic ductal dilatation. No pancreatic or peripancreatic fluid or inflammatory changes. Spleen: Unremarkable. Adrenals/Urinary Tract: In the lower pole of the left kidney there is a 5.9 x 6.1 x 6.3 cm heterogeneously enhancing lesion highly concerning for renal cell carcinoma. The inferior aspect of this lesion comes in very close proximity to the anterior aspect of the left psoas muscle and quadratus lumborum muscle, however, there appears to be an intervening fat plane at this time. The lesion is well separated from the left renal vein. Right kidney and right adrenal gland are normal in appearance. 1.2 x 1.4 cm indeterminate nodule in the lateral limb of the left adrenal gland. No hydroureteronephrosis. Urinary bladder is largely decompressed, but otherwise unremarkable in appearance. Stomach/Bowel: Normal appearance of the stomach. No pathologic dilatation of small bowel or colon. A few scattered colonic diverticulae are noted, without surrounding inflammatory changes to suggest an acute diverticulitis at this time. Appendix is normal. Vascular/Lymphatic: Atherosclerosis throughout the abdominal and pelvic vasculature, without evidence of aneurysm or dissection. Left renal vein is widely patent and separate from the lower pole mass. No lymphadenopathy noted in the abdomen or pelvis.  Reproductive: Uterus and ovaries are unremarkable in appearance. Other: No significant volume of ascites.  No pneumoperitoneum. Musculoskeletal: There are no aggressive appearing lytic or blastic lesions noted in the visualized portions of the skeleton. IMPRESSION: 1. No acute findings in the abdomen or pelvis to account for the patient's symptoms. 2. However, there is a 5.9 x 6.1 x 6.3 cm heterogeneously enhancing mass in the lower pole of the left kidney highly concerning for renal cell carcinoma. At this time, the lesion appears likely to be encapsulated within Gerota's fascia (although it comes very close to the left psoas and quadratus lumborum musculature), does not involve the left renal vein, and does not appear to be associated with lymphadenopathy. Nonemergent Urologic consultation for surgical resection is strongly recommended in the near future. 3. 1.2 x 1.4 cm indeterminate nodule in the left adrenal gland. Attention on followup studies is recommended, as a metastatic lesion is not excluded. 4. 4 mm subpleural nodules in the right middle lobe and left lower lobe. These are highly nonspecific, and favored to represent subpleural lymph nodes, but attention on followup studies is recommended to ensure stability. 5. Cholelithiasis without evidence of acute cholecystitis at this time. 6. Atherosclerosis, including left main and 3 vessel coronary artery disease. Please note that although the presence of coronary artery calcium documents the presence of coronary artery disease, the severity of this disease and any potential stenosis cannot be assessed on  this non-gated CT examination. Assessment for potential risk factor modification, dietary therapy or pharmacologic therapy may be warranted, if clinically indicated. 7. There are calcifications of the aortic valve and mitral valve/annulus. Echocardiographic correlation for evaluation of potential valvular dysfunction may be warranted if clinically indicated.  8. Colonic diverticulosis without evidence of acute diverticulitis at this time. 9. Additional incidental findings, as above. These results were called by telephone at the time of interpretation on 08/29/2015 at 7:34 pm to Dr. Davonna Belling, who verbally acknowledged these results. Electronically Signed   By: Vinnie Langton M.D.   On: 08/29/2015 19:37   Ir Angiogram Visceral Selective  09/07/2015  INDICATION: History of duodenal ulcer now with acute upper GI bleed. Please perform mesenteric arteriogram and potential percutaneous coil embolization. EXAM: 1. ULTRASOUND GUIDANCE FOR ARTERIAL ACCESS. 2. SELECTIVE CELIAC AND SUPERIOR MESENTERIC ARTERIOGRAMS 3. SELECTED PROPER HEPATIC ARTERIOGRAM 4. SELECTIVE GASTRODUODENAL ARTERIOGRAM AND PERCUTANEOUS COIL EMBOLIZATION MEDICATIONS: None ANESTHESIA/SEDATION: Fentanyl 75 mcg IV; Versed 1.5 mg IV Moderate Sedation Time: 65 minutes; The patient was continuously monitored during the procedure by the interventional radiology nurse under my direct supervision. CONTRAST:  71m OMNIPAQUE IOHEXOL 300 MG/ML  SOLN FLUOROSCOPY TIME:  Fluoroscopy Time: 18 minutes 24 seconds (1595 mGy). COMPLICATIONS: SIR Level A - No therapy, no consequence. The procedure was complicated by non targeted embolization of a a 2 mm x 4 mm non fiber interlock coil within a cranial subsegmental branch vessel of the splenic artery. PROCEDURE: Informed consent was obtained from the patient following explanation of the procedure, risks, benefits and alternatives. The patient understands, agrees and consents for the procedure. All questions were addressed. A time out was performed prior to the initiation of the procedure. Maximal barrier sterile technique utilized including caps, mask, sterile gowns, sterile gloves, large sterile drape, hand hygiene, and Betadine prep. The right femoral head was marked fluoroscopically. Under ultrasound guidance, the right common femoral artery was accessed with a  micropuncture kit after the overlying soft tissues were anesthetized with 1% lidocaine. An ultrasound image was saved for documentation purposes. The micropuncture sheath was exchanged for a 5 [place] sheath over a Bentson wire. A closure arteriogram was performed through the side of the sheath confirming access within the right common femoral artery. Over a Bentson wire, a Mickelson catheter was advanced to the level of the thoracic aorta where it was back bled and flushed. The catheter was then utilized to select the celiac artery and a selective celiac arteriogram was performed. With the use of a Fathom micro wire, a regular Renegade micro catheter was utilized to select the proper hepatic artery and a selective proper hepatic arteriogram was performed. The microcatheter was then utilized to select the gastroduodenal artery and a selective gastroduodenal arteriogram was performed. The micro catheter was advanced further into the distal aspect of the gastroduodenal artery. Following sub selective injection, a 2 mm x 4 mm non fibered interlock coil was attempted to be deployed within the distal aspect of the gastroduodenal artery however it became apparent that the micro catheter I was given was NOT a regular sized catheter but rather was a high-flow catheter. As such, coil became entrapped within the distal aspect of the micro catheter. With some manipulation, the coil was ultimately safely deployed within a cranial subsegmental branch of the splenic artery. The incorrect the microcatheter was discarded and a regular Renegade micro catheter was again utilized to select the gastroduodenal artery. Sub selective injection was performed and the gastroduodenal artery was percutaneously coil  embolized with multiple overlapping 2 mm, 3 mm and 4 mm interlock fibered and non-fibered coils to near the vessel's origin. A completion proper hepatic arteriogram was performed. The micro catheter was removed and  completion celiac and superior mesenteric arteriograms were performed. Images reviewed and the procedure was terminated. All wires, catheters and sheaths were removed from the patient. Hemostasis was achieved at the right groin access site with deployment of an Exoseal closure device. A dressing was placed. The patient tolerated the procedure well without immediate postprocedural complication. FINDINGS: Celiac arteriogram demonstrates a conventional branching pattern. Superior mesenteric arteriogram failed to delineate any retrograde contribution to the gastroduodenal artery. Selective proper hepatic arteriogram demonstrates a markedly diminutive and irregular appearing gastroduodenal artery which was confirmed with selective gastroduodenal arteriogram. Ultimately successful percutaneous coil embolization of the gastroduodenal artery to near the vessels origin. Note, procedure was complicated by non target embolization of a cranial subsegmental branch vessel of the splenic artery. Completion proper hepatic arteriogram and demonstrates complete occlusion of the GDA. Completion celiac arteriogram demonstrates a hypertrophied gastroepiploic artery arising from the left gastric artery without definitive contribution to the duodenal bulb. IMPRESSION: 1. Technically successful prophylactic percutaneous coil embolization of the gastroduodenal artery for acute upper GI bleed. 2. Procedure complicated by non target embolization of a cranial subsegmental branch vessel of the splenic artery secondary to mismatch of catheter sizing. This non target coil is felt to be of no clinical significance. Above findings were discussed with Dr. Lala Lund , at the time of procedure completion. Electronically Signed   By: Sandi Mariscal M.D.   On: 09/07/2015 18:09   Ir Angiogram Visceral Selective  09/07/2015  INDICATION: History of duodenal ulcer now with acute upper GI bleed. Please perform mesenteric arteriogram and potential  percutaneous coil embolization. EXAM: 1. ULTRASOUND GUIDANCE FOR ARTERIAL ACCESS. 2. SELECTIVE CELIAC AND SUPERIOR MESENTERIC ARTERIOGRAMS 3. SELECTED PROPER HEPATIC ARTERIOGRAM 4. SELECTIVE GASTRODUODENAL ARTERIOGRAM AND PERCUTANEOUS COIL EMBOLIZATION MEDICATIONS: None ANESTHESIA/SEDATION: Fentanyl 75 mcg IV; Versed 1.5 mg IV Moderate Sedation Time: 65 minutes; The patient was continuously monitored during the procedure by the interventional radiology nurse under my direct supervision. CONTRAST:  8m OMNIPAQUE IOHEXOL 300 MG/ML  SOLN FLUOROSCOPY TIME:  Fluoroscopy Time: 18 minutes 24 seconds (1595 mGy). COMPLICATIONS: SIR Level A - No therapy, no consequence. The procedure was complicated by non targeted embolization of a a 2 mm x 4 mm non fiber interlock coil within a cranial subsegmental branch vessel of the splenic artery. PROCEDURE: Informed consent was obtained from the patient following explanation of the procedure, risks, benefits and alternatives. The patient understands, agrees and consents for the procedure. All questions were addressed. A time out was performed prior to the initiation of the procedure. Maximal barrier sterile technique utilized including caps, mask, sterile gowns, sterile gloves, large sterile drape, hand hygiene, and Betadine prep. The right femoral head was marked fluoroscopically. Under ultrasound guidance, the right common femoral artery was accessed with a micropuncture kit after the overlying soft tissues were anesthetized with 1% lidocaine. An ultrasound image was saved for documentation purposes. The micropuncture sheath was exchanged for a 5 [place] sheath over a Bentson wire. A closure arteriogram was performed through the side of the sheath confirming access within the right common femoral artery. Over a Bentson wire, a Mickelson catheter was advanced to the level of the thoracic aorta where it was back bled and flushed. The catheter was then utilized to select the  celiac artery and a selective celiac arteriogram was  performed. With the use of a Fathom micro wire, a regular Renegade micro catheter was utilized to select the proper hepatic artery and a selective proper hepatic arteriogram was performed. The microcatheter was then utilized to select the gastroduodenal artery and a selective gastroduodenal arteriogram was performed. The micro catheter was advanced further into the distal aspect of the gastroduodenal artery. Following sub selective injection, a 2 mm x 4 mm non fibered interlock coil was attempted to be deployed within the distal aspect of the gastroduodenal artery however it became apparent that the micro catheter I was given was NOT a regular sized catheter but rather was a high-flow catheter. As such, coil became entrapped within the distal aspect of the micro catheter. With some manipulation, the coil was ultimately safely deployed within a cranial subsegmental branch of the splenic artery. The incorrect the microcatheter was discarded and a regular Renegade micro catheter was again utilized to select the gastroduodenal artery. Sub selective injection was performed and the gastroduodenal artery was percutaneously coil embolized with multiple overlapping 2 mm, 3 mm and 4 mm interlock fibered and non-fibered coils to near the vessel's origin. A completion proper hepatic arteriogram was performed. The micro catheter was removed and completion celiac and superior mesenteric arteriograms were performed. Images reviewed and the procedure was terminated. All wires, catheters and sheaths were removed from the patient. Hemostasis was achieved at the right groin access site with deployment of an Exoseal closure device. A dressing was placed. The patient tolerated the procedure well without immediate postprocedural complication. FINDINGS: Celiac arteriogram demonstrates a conventional branching pattern. Superior mesenteric arteriogram failed to delineate any retrograde  contribution to the gastroduodenal artery. Selective proper hepatic arteriogram demonstrates a markedly diminutive and irregular appearing gastroduodenal artery which was confirmed with selective gastroduodenal arteriogram. Ultimately successful percutaneous coil embolization of the gastroduodenal artery to near the vessels origin. Note, procedure was complicated by non target embolization of a cranial subsegmental branch vessel of the splenic artery. Completion proper hepatic arteriogram and demonstrates complete occlusion of the GDA. Completion celiac arteriogram demonstrates a hypertrophied gastroepiploic artery arising from the left gastric artery without definitive contribution to the duodenal bulb. IMPRESSION: 1. Technically successful prophylactic percutaneous coil embolization of the gastroduodenal artery for acute upper GI bleed. 2. Procedure complicated by non target embolization of a cranial subsegmental branch vessel of the splenic artery secondary to mismatch of catheter sizing. This non target coil is felt to be of no clinical significance. Above findings were discussed with Dr. Lala Lund , at the time of procedure completion. Electronically Signed   By: Sandi Mariscal M.D.   On: 09/07/2015 18:09   Ir Angiogram Selective Each Additional Vessel  09/07/2015  INDICATION: History of duodenal ulcer now with acute upper GI bleed. Please perform mesenteric arteriogram and potential percutaneous coil embolization. EXAM: 1. ULTRASOUND GUIDANCE FOR ARTERIAL ACCESS. 2. SELECTIVE CELIAC AND SUPERIOR MESENTERIC ARTERIOGRAMS 3. SELECTED PROPER HEPATIC ARTERIOGRAM 4. SELECTIVE GASTRODUODENAL ARTERIOGRAM AND PERCUTANEOUS COIL EMBOLIZATION MEDICATIONS: None ANESTHESIA/SEDATION: Fentanyl 75 mcg IV; Versed 1.5 mg IV Moderate Sedation Time: 65 minutes; The patient was continuously monitored during the procedure by the interventional radiology nurse under my direct supervision. CONTRAST:  70m OMNIPAQUE IOHEXOL 300  MG/ML  SOLN FLUOROSCOPY TIME:  Fluoroscopy Time: 18 minutes 24 seconds (1595 mGy). COMPLICATIONS: SIR Level A - No therapy, no consequence. The procedure was complicated by non targeted embolization of a a 2 mm x 4 mm non fiber interlock coil within a cranial subsegmental branch vessel of the splenic  artery. PROCEDURE: Informed consent was obtained from the patient following explanation of the procedure, risks, benefits and alternatives. The patient understands, agrees and consents for the procedure. All questions were addressed. A time out was performed prior to the initiation of the procedure. Maximal barrier sterile technique utilized including caps, mask, sterile gowns, sterile gloves, large sterile drape, hand hygiene, and Betadine prep. The right femoral head was marked fluoroscopically. Under ultrasound guidance, the right common femoral artery was accessed with a micropuncture kit after the overlying soft tissues were anesthetized with 1% lidocaine. An ultrasound image was saved for documentation purposes. The micropuncture sheath was exchanged for a 5 [place] vascular sheath over a Bentson wire. A closure arteriogram was performed through the side of the sheath confirming access within the right common femoral artery. Over a Bentson wire, a Mickelson catheter was advanced to the level of the thoracic aorta where it was back bled and flushed. The catheter was then utilized to select the celiac artery and a selective celiac arteriogram was performed. With the use of a Fathom micro wire, a regular Renegade micro catheter was utilized to select the proper hepatic artery and a selective proper hepatic arteriogram was performed. The microcatheter was then utilized to select the gastroduodenal artery and a selective gastroduodenal arteriogram was performed. The micro catheter was advanced further into the distal aspect of the gastroduodenal artery. Following sub selective injection, a 2 mm x 4 mm non fibered  interlock coil was attempted to be deployed within the distal aspect of the gastroduodenal artery however it became apparent that the micro catheter I was given was NOT a regular sized catheter but rather was a high-flow catheter. As such, coil became entrapped within the distal aspect of the micro catheter. With some manipulation, the coil was ultimately safely deployed within a cranial subsegmental branch of the splenic artery. The incorrect the microcatheter was discarded and a regular Renegade micro catheter was again utilized to select the gastroduodenal artery. Sub selective injection was performed and the gastroduodenal artery was percutaneously coil embolized with multiple overlapping 2 mm, 3 mm and 4 mm interlock fibered and non-fibered coils to near the vessel's origin. A completion proper hepatic arteriogram was performed. The micro catheter was removed and completion celiac and superior mesenteric arteriograms were performed. Images reviewed and the procedure was terminated. All wires, catheters and sheaths were removed from the patient. Hemostasis was achieved at the right groin access site with deployment of an Exoseal closure device. A dressing was placed. The patient tolerated the procedure well without immediate postprocedural complication. FINDINGS: Celiac arteriogram demonstrates a conventional branching pattern. Superior mesenteric arteriogram failed to delineate any retrograde contribution to the gastroduodenal artery. Selective proper hepatic arteriogram demonstrates a markedly diminutive and irregular appearing gastroduodenal artery which was confirmed with selective gastroduodenal arteriogram. Ultimately successful percutaneous coil embolization of the gastroduodenal artery to near the vessels origin. Note, procedure was complicated by non target embolization of a cranial subsegmental branch vessel of the splenic artery. Completion proper hepatic arteriogram and demonstrates complete  occlusion of the GDA. Completion celiac arteriogram demonstrates a hypertrophied gastroepiploic artery arising from the left gastric artery without definitive contribution to the duodenal bulb. IMPRESSION: 1. Technically successful prophylactic percutaneous coil embolization of the gastroduodenal artery for acute upper GI bleed. 2. Procedure complicated by non target embolization of a cranial subsegmental branch vessel of the splenic artery secondary to mismatch of catheter sizing. This non target coil is felt to be of no clinical significance. Above findings were  discussed with Dr. Lala Lund , at the time of procedure completion. Electronically Signed   By: Sandi Mariscal M.D.   On: 09/07/2015 18:09   Ir Angiogram Follow Up Study  09/07/2015  INDICATION: History of duodenal ulcer now with acute upper GI bleed. Please perform mesenteric arteriogram and potential percutaneous coil embolization. EXAM: 1. ULTRASOUND GUIDANCE FOR ARTERIAL ACCESS. 2. SELECTIVE CELIAC AND SUPERIOR MESENTERIC ARTERIOGRAMS 3. SELECTED PROPER HEPATIC ARTERIOGRAM 4. SELECTIVE GASTRODUODENAL ARTERIOGRAM AND PERCUTANEOUS COIL EMBOLIZATION MEDICATIONS: None ANESTHESIA/SEDATION: Fentanyl 75 mcg IV; Versed 1.5 mg IV Moderate Sedation Time: 65 minutes; The patient was continuously monitored during the procedure by the interventional radiology nurse under my direct supervision. CONTRAST:  21m OMNIPAQUE IOHEXOL 300 MG/ML  SOLN FLUOROSCOPY TIME:  Fluoroscopy Time: 18 minutes 24 seconds (1595 mGy). COMPLICATIONS: SIR Level A - No therapy, no consequence. The procedure was complicated by non targeted embolization of a a 2 mm x 4 mm non fiber interlock coil within a cranial subsegmental branch vessel of the splenic artery. PROCEDURE: Informed consent was obtained from the patient following explanation of the procedure, risks, benefits and alternatives. The patient understands, agrees and consents for the procedure. All questions were addressed. A  time out was performed prior to the initiation of the procedure. Maximal barrier sterile technique utilized including caps, mask, sterile gowns, sterile gloves, large sterile drape, hand hygiene, and Betadine prep. The right femoral head was marked fluoroscopically. Under ultrasound guidance, the right common femoral artery was accessed with a micropuncture kit after the overlying soft tissues were anesthetized with 1% lidocaine. An ultrasound image was saved for documentation purposes. The micropuncture sheath was exchanged for a 5 [place] sheath over a Bentson wire. A closure arteriogram was performed through the side of the sheath confirming access within the right common femoral artery. Over a Bentson wire, a Mickelson catheter was advanced to the level of the thoracic aorta where it was back bled and flushed. The catheter was then utilized to select the celiac artery and a selective celiac arteriogram was performed. With the use of a Fathom micro wire, a regular Renegade micro catheter was utilized to select the proper hepatic artery and a selective proper hepatic arteriogram was performed. The microcatheter was then utilized to select the gastroduodenal artery and a selective gastroduodenal arteriogram was performed. The micro catheter was advanced further into the distal aspect of the gastroduodenal artery. Following sub selective injection, a 2 mm x 4 mm non fibered interlock coil was attempted to be deployed within the distal aspect of the gastroduodenal artery however it became apparent that the micro catheter I was given was NOT a regular sized catheter but rather was a high-flow catheter. As such, coil became entrapped within the distal aspect of the micro catheter. With some manipulation, the coil was ultimately safely deployed within a cranial subsegmental branch of the splenic artery. The incorrect the microcatheter was discarded and a regular Renegade micro catheter was again utilized to  select the gastroduodenal artery. Sub selective injection was performed and the gastroduodenal artery was percutaneously coil embolized with multiple overlapping 2 mm, 3 mm and 4 mm interlock fibered and non-fibered coils to near the vessel's origin. A completion proper hepatic arteriogram was performed. The micro catheter was removed and completion celiac and superior mesenteric arteriograms were performed. Images reviewed and the procedure was terminated. All wires, catheters and sheaths were removed from the patient. Hemostasis was achieved at the right groin access site with deployment of an Exoseal closure device.  A dressing was placed. The patient tolerated the procedure well without immediate postprocedural complication. FINDINGS: Celiac arteriogram demonstrates a conventional branching pattern. Superior mesenteric arteriogram failed to delineate any retrograde contribution to the gastroduodenal artery. Selective proper hepatic arteriogram demonstrates a markedly diminutive and irregular appearing gastroduodenal artery which was confirmed with selective gastroduodenal arteriogram. Ultimately successful percutaneous coil embolization of the gastroduodenal artery to near the vessels origin. Note, procedure was complicated by non target embolization of a cranial subsegmental branch vessel of the splenic artery. Completion proper hepatic arteriogram and demonstrates complete occlusion of the GDA. Completion celiac arteriogram demonstrates a hypertrophied gastroepiploic artery arising from the left gastric artery without definitive contribution to the duodenal bulb. IMPRESSION: 1. Technically successful prophylactic percutaneous coil embolization of the gastroduodenal artery for acute upper GI bleed. 2. Procedure complicated by non target embolization of a cranial subsegmental branch vessel of the splenic artery secondary to mismatch of catheter sizing. This non target coil is felt to be of no clinical  significance. Above findings were discussed with Dr. Lala Lund , at the time of procedure completion. Electronically Signed   By: Sandi Mariscal M.D.   On: 09/07/2015 18:09   Ir US Guide Vasc Access Right  09/07/2015  INDICATION: History of duodenal ulcer now with acute upper GI bleed. Please perform mesenteric arteriogram and potential percutaneous coil embolization. EXAM: 1. ULTRASOUND GUIDANCE FOR ARTERIAL ACCESS. 2. SELECTIVE CELIAC AND SUPERIOR MESENTERIC ARTERIOGRAMS 3. SELECTED PROPER HEPATIC ARTERIOGRAM 4. SELECTIVE GASTRODUODENAL ARTERIOGRAM AND PERCUTANEOUS COIL EMBOLIZATION MEDICATIONS: None ANESTHESIA/SEDATION: Fentanyl 75 mcg IV; Versed 1.5 mg IV Moderate Sedation Time: 65 minutes; The patient was continuously monitored during the procedure by the interventional radiology nurse under my direct supervision. CONTRAST:  53m OMNIPAQUE IOHEXOL 300 MG/ML  SOLN FLUOROSCOPY TIME:  Fluoroscopy Time: 18 minutes 24 seconds (1595 mGy). COMPLICATIONS: SIR Level A - No therapy, no consequence. The procedure was complicated by non targeted embolization of a a 2 mm x 4 mm non fiber interlock coil within a cranial subsegmental branch vessel of the splenic artery. PROCEDURE: Informed consent was obtained from the patient following explanation of the procedure, risks, benefits and alternatives. The patient understands, agrees and consents for the procedure. All questions were addressed. A time out was performed prior to the initiation of the procedure. Maximal barrier sterile technique utilized including caps, mask, sterile gowns, sterile gloves, large sterile drape, hand hygiene, and Betadine prep. The right femoral head was marked fluoroscopically. Under ultrasound guidance, the right common femoral artery was accessed with a micropuncture kit after the overlying soft tissues were anesthetized with 1% lidocaine. An ultrasound image was saved for documentation purposes. The micropuncture sheath was exchanged for a  5 [place] sheath over a Bentson wire. A closure arteriogram was performed through the side of the sheath confirming access within the right common femoral artery. Over a Bentson wire, a Mickelson catheter was advanced to the level of the thoracic aorta where it was back bled and flushed. The catheter was then utilized to select the celiac artery and a selective celiac arteriogram was performed. With the use of a Fathom micro wire, a regular Renegade micro catheter was utilized to select the proper hepatic artery and a selective proper hepatic arteriogram was performed. The microcatheter was then utilized to select the gastroduodenal artery and a selective gastroduodenal arteriogram was performed. The micro catheter was advanced further into the distal aspect of the gastroduodenal artery. Following sub selective injection, a 2 mm x 4 mm non fibered interlock  coil was attempted to be deployed within the distal aspect of the gastroduodenal artery however it became apparent that the micro catheter I was given was NOT a regular sized catheter but rather was a high-flow catheter. As such, coil became entrapped within the distal aspect of the micro catheter. With some manipulation, the coil was ultimately safely deployed within a cranial subsegmental branch of the splenic artery. The incorrect the microcatheter was discarded and a regular Renegade micro catheter was again utilized to select the gastroduodenal artery. Sub selective injection was performed and the gastroduodenal artery was percutaneously coil embolized with multiple overlapping 2 mm, 3 mm and 4 mm interlock fibered and non-fibered coils to near the vessel's origin. A completion proper hepatic arteriogram was performed. The micro catheter was removed and completion celiac and superior mesenteric arteriograms were performed. Images reviewed and the procedure was terminated. All wires, catheters and sheaths were removed from the patient. Hemostasis was  achieved at the right groin access site with deployment of an Exoseal closure device. A dressing was placed. The patient tolerated the procedure well without immediate postprocedural complication. FINDINGS: Celiac arteriogram demonstrates a conventional branching pattern. Superior mesenteric arteriogram failed to delineate any retrograde contribution to the gastroduodenal artery. Selective proper hepatic arteriogram demonstrates a markedly diminutive and irregular appearing gastroduodenal artery which was confirmed with selective gastroduodenal arteriogram. Ultimately successful percutaneous coil embolization of the gastroduodenal artery to near the vessels origin. Note, procedure was complicated by non target embolization of a cranial subsegmental branch vessel of the splenic artery. Completion proper hepatic arteriogram and demonstrates complete occlusion of the GDA. Completion celiac arteriogram demonstrates a hypertrophied gastroepiploic artery arising from the left gastric artery without definitive contribution to the duodenal bulb. IMPRESSION: 1. Technically successful prophylactic percutaneous coil embolization of the gastroduodenal artery for acute upper GI bleed. 2. Procedure complicated by non target embolization of a cranial subsegmental branch vessel of the splenic artery secondary to mismatch of catheter sizing. This non target coil is felt to be of no clinical significance. Above findings were discussed with Dr. Lala Lund , at the time of procedure completion. Electronically Signed   By: Sandi Mariscal M.D.   On: 09/07/2015 18:09   Dg Chest Port 1 View  09/04/2015  CLINICAL DATA:  Central line placement. EXAM: PORTABLE CHEST 1 VIEW COMPARISON:  08/29/2015 FINDINGS: Endotracheal tube terminates 6 cm above the carina. Right internal jugular approach central venous catheter seen with tip at the expected location of superior vena cava. Cardiomediastinal silhouette is enlarged, likely exaggerated by  portable technique and rotation. Mediastinal contours appear intact. There is no evidence of focal airspace consolidation, pleural effusion or pneumothorax. Lung volumes are low. Osseous structures are without acute abnormality. Soft tissues are grossly normal. IMPRESSION: Low lung volumes. Endotracheal tube 6 cm above the carina. Advancement with 2 cm may be considered. Right-sided central venous catheter in satisfactory position radiographically. Electronically Signed   By: Fidela Salisbury M.D.   On: 09/04/2015 16:06   Dg Swallowing Func-speech Pathology  09/08/2015  Objective Swallowing Evaluation: Type of Study: MBS-Modified Barium Swallow Study Patient Details Name: Ashley Ramsey MRN: [id number] Date of Birth: [birth year] Today's Date: 09/08/2015 Time: SLP Start Time (ACUTE ONLY): 1032-SLP Stop Time (ACUTE ONLY): 1058 SLP Time Calculation (min) (ACUTE ONLY): 26 min Past Medical History: Past Medical History Diagnosis Date . Thyroid disease  . Hypertension  . Neck mass hospitalized 08/29/2015 . Type II diabetes mellitus ([place])  . Left kidney mass  . Anemia, chronic disease  Past Surgical History: Past Surgical History Procedure Laterality Date . No past surgeries   . Esophagogastroduodenoscopy N/A 09/04/2015   Procedure: ESOPHAGOGASTRODUODENOSCOPY (EGD);  Surgeon: Manus Gunning, MD;  Location: [place];  Service: Gastroenterology;  Laterality: N/A; . Direct laryngoscopy N/A 09/02/2015   Procedure: DIRECT LARYNGOSCOPY WITH BIOPSY;  Surgeon: Izora Gala, MD;  Location: [place];  Service: ENT;  Laterality: N/A; . Esophagoscopy N/A 09/02/2015   Procedure: ESOPHAGOSCOPY;  Surgeon: Izora Gala, MD;  Location: [place];  Service: ENT;  Laterality: N/A; . Tracheostomy tube placement N/A 09/04/2015   Procedure: TRACHEOSTOMY;  Surgeon: Izora Gala, MD;  Location: [place];  Service: ENT;  Laterality: N/A; . Direct laryngoscopy  09/04/2015   Procedure: DIRECT View LARYNGOSCOPY with cautery of of tumor;  Surgeon: Izora Gala, MD;  Location: [facility];  Service: ENT;; HPI: Pt is 60 y.o. female with h/o hypertension and NIDDM. Pt presented to ED with voice changes, difficult swallowing and abdominal discomfort that have progressed over the last month. 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 MBS 1/21 recommended Dys 1, pudding liquids. GI bleed, made NPO, swallow assessment re-ordered. SLP recommended repeat MBS  Subjective: pt alert, eager for food/drink Assessment / Plan / Recommendation CHL IP CLINICAL IMPRESSIONS 09/08/2015 Therapy Diagnosis Moderate pharyngeal phase dysphagia;Severe pharyngeal phase dysphagia Clinical Impression Pt's swallow function decreased from initial MBS 1/21. Swallow function assessed without speaking valve due to poor tolerance during yesterday's treatment session. She exhibited moderate-severe anatomically based pharyngeal dysphagia resulting in sensory and motor deficits. Delayed initiation of swallow and poor base of tongue retraction lead to moderate vallecular residue that was observed throughout the session. Aspiration was noted of both puree and honey thick liquids and pt did not sense aspirated bolus. Moderate verbal and visual cues to cough and swallow multiple times ineffective although finger occlusion of trach increased pressure which helped to significantly remove penetrates. 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 Recommend NPO except for free water protocol under strict guidelines.   Impact on safety and function Severe aspiration risk;Risk for inadequate nutrition/hydration   CHL IP TREATMENT RECOMMENDATION 09/08/2015 Treatment Recommendations Therapy as outlined in treatment plan below   Prognosis 09/08/2015 Prognosis for Safe Diet Advancement Guarded Barriers to Reach Goals Severity of deficits Barriers/Prognosis Comment -- CHL IP DIET RECOMMENDATION 09/08/2015 SLP Diet Recommendations  NPO Liquid Administration via -- Medication Administration Via alternative means Compensations -- Postural Changes --   CHL IP OTHER RECOMMENDATIONS 09/08/2015 Recommended Consults -- Oral Care Recommendations Oral care QID Other Recommendations --   CHL IP FOLLOW UP RECOMMENDATIONS 09/08/2015 Follow up Recommendations 24 hour supervision/assistance   CHL IP FREQUENCY AND DURATION 09/08/2015 Speech Therapy Frequency (ACUTE ONLY) min 2x/week Treatment Duration 2 weeks      CHL IP ORAL PHASE 09/08/2015 Oral Phase WFL Oral - Pudding Teaspoon -- Oral - Pudding Cup -- Oral - Honey Teaspoon -- Oral - Honey Cup -- Oral - Nectar Teaspoon -- Oral - Nectar Cup -- Oral - Nectar Straw -- Oral - Thin Teaspoon -- Oral - Thin Cup -- Oral - Thin Straw -- Oral - Puree -- Oral - Mech Soft -- Oral - Regular -- Oral - Multi-Consistency -- Oral - Pill -- Oral Phase - Comment --  CHL IP PHARYNGEAL PHASE 09/08/2015 Pharyngeal Phase Impaired Pharyngeal- Pudding Teaspoon NT Pharyngeal Material enters airway, CONTACTS cords and not ejected out;Material enters airway, passes BELOW cords without attempt  by patient to eject out (silent aspiration) Pharyngeal- Pudding Cup -- Pharyngeal -- Pharyngeal- Honey Teaspoon Delayed swallow initiation-vallecula;Reduced anterior laryngeal mobility;Reduced airway/laryngeal closure;Reduced laryngeal elevation;Reduced tongue base retraction;Penetration/Apiration after swallow;Pharyngeal residue - valleculae Pharyngeal Material enters airway, CONTACTS cords and not ejected out Pharyngeal- Honey Cup Delayed swallow initiation-vallecula;Reduced anterior laryngeal mobility;Reduced laryngeal elevation;Reduced airway/laryngeal closure;Reduced tongue base retraction;Penetration/Apiration after swallow;Moderate aspiration;Pharyngeal residue - valleculae;Compensatory strategies attempted (with notebox) Pharyngeal Material enters airway, passes BELOW cords without attempt by patient to eject out (silent aspiration)  Pharyngeal- Nectar Teaspoon NT Pharyngeal -- Pharyngeal- Nectar Cup -- Pharyngeal -- Pharyngeal- Nectar Straw -- Pharyngeal -- Pharyngeal- Thin Teaspoon NT Pharyngeal -- Pharyngeal- Thin Cup -- Pharyngeal -- Pharyngeal- Thin Straw NT Pharyngeal -- Pharyngeal- Puree Delayed swallow initiation-vallecula;Pharyngeal residue - valleculae;Penetration/Apiration after swallow;Reduced anterior laryngeal mobility;Reduced airway/laryngeal closure;Reduced tongue base retraction Pharyngeal Material enters airway, passes BELOW cords without attempt by patient to eject out (silent aspiration) Pharyngeal- Mechanical Soft -- Pharyngeal -- Pharyngeal- Regular -- Pharyngeal -- Pharyngeal- Multi-consistency -- Pharyngeal -- Pharyngeal- Pill -- Pharyngeal -- Pharyngeal Comment --  CHL IP CERVICAL ESOPHAGEAL PHASE 09/08/2015 Cervical Esophageal Phase WFL Pudding Teaspoon -- Pudding Cup -- Honey Teaspoon -- Honey Cup -- Nectar Teaspoon -- Nectar Cup -- Nectar Straw -- Thin Teaspoon -- Thin Cup -- Thin Straw -- Puree -- Mechanical Soft -- Regular -- Multi-consistency -- Pill -- Cervical Esophageal Comment -- No flowsheet data found. [place] Siren 09/08/2015, 2:11 PM Orbie Pyo Colvin Caroli.Ed CCC-SLP Pager [phone]              Dg Swallowing Func-speech Pathology  09/03/2015  Objective Swallowing Evaluation:   Patient Details Name: Ashley Ramsey MRN: [id number] Date of Birth: Mar 09, 1956 Today's Date: 09/03/2015 Time: SLP Start Time (ACUTE ONLY): 1124-SLP Stop Time (ACUTE ONLY): 1143 SLP Time Calculation (min) (ACUTE ONLY): 19 min Past Medical History: Past Medical History Diagnosis Date . Thyroid disease  . Hypertension  . Neck mass hospitalized 08/29/2015 . Type II diabetes mellitus ([place])  . Left kidney mass  . Anemia, chronic disease  Past Surgical History: Past Surgical History Procedure Laterality Date . No past surgeries   HPI: Pt is 60 y.o. female with h/o hypertension and NIDDM. Pt presented to ED with voice changes, difficult  swallowing and abdominal discomfort that have progressed over the last month. 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 Subjective: pt alert, eager for food/drink Assessment / Plan / Recommendation CHL IP CLINICAL IMPRESSIONS 09/03/2015 Therapy Diagnosis Severe pharyngeal phase dysphagia Clinical Impression Pt has a severe pharyngeal dysphaghia secondary to presence of supraglottic mass, which impedes bolus flow and decreased airway closure. All consistencies enter the airway during the swallow even with tsp-sized boluses. Aspiration is usually sensed, but she is not able to expel aspirates from her trachea. A chin tuck paired with an immediate cough facilitates clearance through the pharynx and clearance of penetrates with all consistencies tested; however, all liquids are then re-penetrated and aspirated after the swallow. Purees do not re-enter the airway, although aspiration risk remains due to residue. Recommend Dys 1 diet and pudding thick liquids with a chin tuck and immediate cough following all bites. Impact on safety and function Moderate aspiration risk;Risk for inadequate nutrition/hydration   CHL IP TREATMENT RECOMMENDATION 09/03/2015 Treatment Recommendations Therapy as outlined in treatment plan below   Prognosis 09/03/2015 Prognosis for Safe Diet Advancement Guarded Barriers to Reach Goals -- Barriers/Prognosis Comment -- CHL IP DIET RECOMMENDATION 09/03/2015 SLP Diet Recommendations  Dysphagia 1 (Puree) solids;Pudding thick liquid Liquid Administration via Spoon Medication Administration Crushed with puree Compensations Slow rate;Small sips/bites;Chin tuck;Hard cough after swallow Postural Changes Remain semi-upright after after feeds/meals (Comment);Seated upright at 90 degrees   CHL IP OTHER RECOMMENDATIONS 09/03/2015 Recommended Consults -- Oral Care Recommendations Oral care BID Other Recommendations Order thickener from pharmacy;Prohibited food  (jello, ice cream, thin soups);Remove water pitcher   CHL IP FOLLOW UP RECOMMENDATIONS 09/03/2015 Follow up Recommendations Outpatient SLP;24 hour supervision/assistance   CHL IP FREQUENCY AND DURATION 09/03/2015 Speech Therapy Frequency (ACUTE ONLY) min 2x/week Treatment Duration 2 weeks      CHL IP ORAL PHASE 09/03/2015 Oral Phase WFL Oral - Pudding Teaspoon -- Oral - Pudding Cup -- Oral - Honey Teaspoon -- Oral - Honey Cup -- Oral - Nectar Teaspoon -- Oral - Nectar Cup -- Oral - Nectar Straw -- Oral - Thin Teaspoon -- Oral - Thin Cup -- Oral - Thin Straw -- Oral - Puree -- Oral - Mech Soft -- Oral - Regular -- Oral - Multi-Consistency -- Oral - Pill -- Oral Phase - Comment --  CHL IP PHARYNGEAL PHASE 09/03/2015 Pharyngeal Phase Impaired Pharyngeal- Pudding Teaspoon -- Pharyngeal -- Pharyngeal- Pudding Cup -- Pharyngeal -- Pharyngeal- Honey Teaspoon Reduced airway/laryngeal closure;Penetration/Aspiration during swallow;Penetration/Apiration after swallow;Pharyngeal residue - pyriform;Pharyngeal residue - posterior pharnyx;Compensatory strategies attempted (with notebox) Pharyngeal Material enters airway, passes BELOW cords and not ejected out despite cough attempt by patient Pharyngeal- Honey Cup -- Pharyngeal -- Pharyngeal- Nectar Teaspoon Reduced airway/laryngeal closure;Penetration/Aspiration during swallow;Penetration/Apiration after swallow;Pharyngeal residue - pyriform;Pharyngeal residue - posterior pharnyx;Compensatory strategies attempted (with notebox) Pharyngeal Material enters airway, passes BELOW cords and not ejected out despite cough attempt by patient Pharyngeal- Nectar Cup -- Pharyngeal -- Pharyngeal- Nectar Straw -- Pharyngeal -- Pharyngeal- Thin Teaspoon Reduced airway/laryngeal closure;Penetration/Aspiration during swallow;Penetration/Apiration after swallow;Pharyngeal residue - pyriform;Pharyngeal residue - posterior pharnyx;Compensatory strategies attempted (with notebox) Pharyngeal Material  enters airway, passes BELOW cords and not ejected out despite cough attempt by patient Pharyngeal- Thin Cup -- Pharyngeal -- Pharyngeal- Thin Straw Reduced airway/laryngeal closure;Penetration/Aspiration during swallow;Penetration/Apiration after swallow;Pharyngeal residue - pyriform;Pharyngeal residue - posterior pharnyx;Compensatory strategies attempted (with notebox) Pharyngeal Material enters airway, passes BELOW cords and not ejected out despite cough attempt by patient Pharyngeal- Puree Reduced airway/laryngeal closure;Penetration/Aspiration during swallow;Compensatory strategies attempted (with notebox);Pharyngeal residue - posterior pharnyx Pharyngeal Material enters airway, remains ABOVE vocal cords and not ejected out Pharyngeal- Mechanical Soft -- Pharyngeal -- Pharyngeal- Regular -- Pharyngeal -- Pharyngeal- Multi-consistency -- Pharyngeal -- Pharyngeal- Pill -- Pharyngeal -- Pharyngeal Comment --  CHL IP CERVICAL ESOPHAGEAL PHASE 09/03/2015 Cervical Esophageal Phase (No Data) Pudding Teaspoon -- Pudding Cup -- Honey Teaspoon -- Honey Cup -- Nectar Teaspoon -- Nectar Cup -- Nectar Straw -- Thin Teaspoon -- Thin Cup -- Thin Straw -- Puree -- Mechanical Soft -- Regular -- Multi-consistency -- Pill -- Cervical Esophageal Comment -- No flowsheet data found. Germain Osgood, M.A. CCC-SLP [phone] Germain Osgood 09/03/2015, 1:33 PM              [place] [facility] [employer] Guide Road[employer]  09/07/2015  INDICATION: History of duodenal ulcer now with acute upper GI bleed. Please perform mesenteric arteriogram and potential percutaneous coil embolization. EXAM: 1. ULTRASOUND GUIDANCE FOR ARTERIAL ACCESS. 2. SELECTIVE CELIAC AND SUPERIOR MESENTERIC ARTERIOGRAMS 3. SELECTED PROPER HEPATIC ARTERIOGRAM 4. SELECTIVE GASTRODUODENAL ARTERIOGRAM AND PERCUTANEOUS COIL EMBOLIZATION MEDICATIONS: None ANESTHESIA/SEDATION: Fentanyl 75 mcg IV; Versed 1.5 mg IV Moderate Sedation Time: 65 minutes; The  patient was  continuously monitored during the procedure by the interventional radiology nurse under my direct supervision. CONTRAST:  75m OMNIPAQUE IOHEXOL 300 MG/ML  SOLN FLUOROSCOPY TIME:  Fluoroscopy Time: 18 minutes 24 seconds (1595 mGy). COMPLICATIONS: SIR Level A - No therapy, no consequence. The procedure was complicated by non targeted embolization of a a 2 mm x 4 mm non fiber interlock coil within a cranial subsegmental branch vessel of the splenic artery. PROCEDURE: Informed consent was obtained from the patient following explanation of the procedure, risks, benefits and alternatives. The patient understands, agrees and consents for the procedure. All questions were addressed. A time out was performed prior to the initiation of the procedure. Maximal barrier sterile technique utilized including caps, mask, sterile gowns, sterile gloves, large sterile drape, hand hygiene, and Betadine prep. The right femoral head was marked fluoroscopically. Under ultrasound guidance, the right common femoral artery was accessed with a micropuncture kit after the overlying soft tissues were anesthetized with 1% lidocaine. An ultrasound image was saved for documentation purposes. The micropuncture sheath was exchanged for a 5 [place] sheath over a Bentson wire. A closure arteriogram was performed through the side of the sheath confirming access within the right common femoral artery. Over a Bentson wire, a Mickelson catheter was advanced to the level of the thoracic aorta where it was back bled and flushed. The catheter was then utilized to select the celiac artery and a selective celiac arteriogram was performed. With the use of a Fathom micro wire, a regular Renegade micro catheter was utilized to select the proper hepatic artery and a selective proper hepatic arteriogram was performed. The microcatheter was then utilized to select the gastroduodenal artery and a selective gastroduodenal arteriogram was performed.  The micro catheter was advanced further into the distal aspect of the gastroduodenal artery. Following sub selective injection, a 2 mm x 4 mm non fibered interlock coil was attempted to be deployed within the distal aspect of the gastroduodenal artery however it became apparent that the micro catheter I was given was NOT a regular sized catheter but rather was a high-flow catheter. As such, coil became entrapped within the distal aspect of the micro catheter. With some manipulation, the coil was ultimately safely deployed within a cranial subsegmental branch of the splenic artery. The incorrect the microcatheter was discarded and a regular Renegade micro catheter was again utilized to select the gastroduodenal artery. Sub selective injection was performed and the gastroduodenal artery was percutaneously coil embolized with multiple overlapping 2 mm, 3 mm and 4 mm interlock fibered and non-fibered coils to near the vessel's origin. A completion proper hepatic arteriogram was performed. The micro catheter was removed and completion celiac and superior mesenteric arteriograms were performed. Images reviewed and the procedure was terminated. All wires, catheters and sheaths were removed from the patient. Hemostasis was achieved at the right groin access site with deployment of an Exoseal closure device. A dressing was placed. The patient tolerated the procedure well without immediate postprocedural complication. FINDINGS: Celiac arteriogram demonstrates a conventional branching pattern. Superior mesenteric arteriogram failed to delineate any retrograde contribution to the gastroduodenal artery. Selective proper hepatic arteriogram demonstrates a markedly diminutive and irregular appearing gastroduodenal artery which was confirmed with selective gastroduodenal arteriogram. Ultimately successful percutaneous coil embolization of the gastroduodenal artery to near the vessels origin. Note, procedure was complicated by non  target embolization of a cranial subsegmental branch vessel of the splenic artery. Completion proper hepatic arteriogram and demonstrates complete occlusion of the GDA. Completion celiac arteriogram demonstrates  a hypertrophied gastroepiploic artery arising from the left gastric artery without definitive contribution to the duodenal bulb. IMPRESSION: 1. Technically successful prophylactic percutaneous coil embolization of the gastroduodenal artery for acute upper GI bleed. 2. Procedure complicated by non target embolization of a cranial subsegmental branch vessel of the splenic artery secondary to mismatch of catheter sizing. This non target coil is felt to be of no clinical significance. Above findings were discussed with Dr. Lala Lund , at the time of procedure completion. Electronically Signed   By: Sandi Mariscal M.D.   On: 09/07/2015 18:09    Signature  Thurnell Lose M.D on 09/10/2015 at 9:29 AM  Between 7am to 7pm - Pager - [phone], After 7pm go to [email or web address] - password [email or web address]  [phone]   LOS: 11 days

## 2015-09-11 LAB — BASIC METABOLIC PANEL
Anion gap: 8 (ref 5–15)
BUN: 16 mg/dL (ref 6–20)
CHLORIDE: 100 mmol/L — AB (ref 101–111)
CO2: 29 mmol/L (ref 22–32)
CREATININE: 0.58 mg/dL (ref 0.44–1.00)
Calcium: 8.4 mg/dL — ABNORMAL LOW (ref 8.9–10.3)
GFR calc non Af Amer: >60 mL/min (ref >60)
Glucose, Bld: 172 mg/dL — ABNORMAL HIGH (ref 65–99)
POTASSIUM: 4.2 mmol/L (ref 3.5–5.1)
Sodium: 137 mmol/L (ref 135–145)

## 2015-09-11 LAB — MAGNESIUM: MAGNESIUM: 1.7 mg/dL (ref 1.7–2.4)

## 2015-09-11 LAB — GLUCOSE, CAPILLARY
GLUCOSE-CAPILLARY: 164 mg/dL — AB (ref 65–99)
GLUCOSE-CAPILLARY: 135 mg/dL — AB (ref 65–99)
GLUCOSE-CAPILLARY: 138 mg/dL — AB (ref 65–99)

## 2015-09-11 LAB — PHOSPHORUS: Phosphorus: 4.2 mg/dL (ref 2.5–4.6)

## 2015-09-11 MED ORDER — SCOPOLAMINE 1 MG/3DAYS TD PT72
1.0000 | MEDICATED_PATCH | TRANSDERMAL | Status: DC
  Administered 2015-09-11: 1.5 mg via TRANSDERMAL
  Filled 2015-09-11: qty 1

## 2015-09-11 MED ORDER — FAT EMULSION 20 % IV EMUL
250.0000 mL | INTRAVENOUS | Status: AC
  Administered 2015-09-11: 250 mL via INTRAVENOUS
  Filled 2015-09-11 (×2): qty 250

## 2015-09-11 MED ORDER — TRACE MINERALS CR-CU-MN-SE-ZN 10-1000-500-60 MCG/ML IV SOLN
INTRAVENOUS | Status: AC
  Administered 2015-09-11: 17:00:00 via INTRAVENOUS
  Filled 2015-09-11: qty 1988

## 2015-09-11 NOTE — Progress Notes (Signed)
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   No Known Allergies  Patient Measurements: Height: 5\' 3"  (160 cm) Weight: 250 lb (113.399 kg) IBW/kg (Calculated) : 52.4 Adjusted Body Weight: 67 kg  Usual Weight: 113.4 BMI = 44   Vital Signs: Temp: 98.3 F (36.8 C) (01/29 0504) Temp Source: Oral (01/29 0504) BP: 145/69 mmHg (01/29 0504) Pulse Rate: 81 (01/29 0504) Intake/Output from previous day: 01/28 0701 - 01/29 0700 In: 2917.6 [I.V.:781.7; TPN:2135.9] Out: 2100 [Urine:2100] Intake/Output from this shift:    Labs:  Recent Labs  09/09/15 0430  WBC 12.7*  HGB 8.2*  HCT 24.6*  PLT 142*     Recent Labs  09/09/15 0430 09/10/15 0459 09/11/15 0520  NA 139 136 137  K 3.6 4.0 4.2  CL 105 103 100*  CO2 28 28 29   GLUCOSE 134* 161* 172*  BUN 10 13 16   CREATININE 0.62 0.70 0.58  CALCIUM 7.7* 8.1* 8.4*  MG 1.8  --  1.7  PHOS  --   --  4.2   Estimated Creatinine Clearance: 91.8 mL/min (by C-G formula based on Cr of 0.58).    Recent Labs  09/10/15 1654 09/10/15 2357 09/11/15 0555  GLUCAP 150* 160* 164*    Insulin Requirements in the past 24 hours:  6 units SSI   Assessment: 48 YOF who presented with worsening hoarseness of voice and abdominal pain. She had difficulty swallowing foods, N/V, weight loss and no appetite. CT neck found laryngeal mass and CT abdmonen revealed a left renal mass. She was also found to have a UTI with SIRS. Admitted for IV antibiotics. Underwent a biopsy by ENT on 1/20. Hospital course also complicated by development of lower GI bleeding and associated blood loss anemia   Surgeries/Procedures:  GI: S/p upper GI bleed on 09/04/15 requiring multiple units of PRBC. GI/ENT consulted. Tracheostomy placement by ENT on 1/23. She will be nothing by mouth for a while and PEG tube not possible due to likely stomach and duodenal ulcers. On PPI drip. Prealbumin  8.7 09/07/15: gastroduodenal artery was coiled/embolized by IR. No further bleeding since Endo: H/o DM, CBGs 138-164. On SSI  Lytes: K 4.2, CorrCa 9.5, Mg 1.7, Phos 4.2 Renal: SCr 0.58,  LR @ 10 mL/hr  UOP 0.8 ml/kg/hr  Pulm: Trach collar; 28% FiO2 on 5L  Cards: H/o HTN, BP 133/52 Hepatobil: LFTs not elevated  Neuro: Probable head and neck cancer. Per oncology, she is poor candidate for chemotherapy and most likely has renal cancer. Prognosis is extremely poor. Oncology recommends palliative radiation treatments and palliative care consult. On folic acid and thiamine  Heme: Hgb down to 8.2, Plt 142k ID: WBC 13.3>>12.7 ceftriaxone for E.coli UTI 1/17>>1/24 Best Practices: PPI, SCDs  TPN Access: PICC line 1/23>> TPN start date: 1/23>>   Current Nutrition:  Clinimix-E 5/15 83 mL/hr and 20% IVFE @ 10 mL/hr - at goal   Nutritional Goals:  Kcal: 1700-2000 Protein: 105-120 gm Fluid: 1.7-2 L  Plan:  - Will begin cycling TPN today since plan is to send patient home on TPN - Start Clinimix-E 5/15 @ 50 mL/hr x 1 hours, then at 118 mL/hr x 16 hours, then at 50 mL/hr x 1 hour over 18 hours. IVFE 20% at 14 mL/hr x 18 hours. This will provide ~ 100 gm of protein (~ 95% of goal) and 1894  kCal (100% of goal).  - Monitor CBGs as TPN is  cycled. No insulin in TPN - Continue MVI and TE in TPN bag  - IV folic acid and thiamine added to TPN daily - F/u TPN labs tomorrow   Albertina Parr, PharmD., BCPS Clinical Pharmacist Phone [phone]

## 2015-09-11 NOTE — Progress Notes (Signed)
PATIENT DETAILS Name: Ashley Ramsey Age: 60 y.o. Sex: female Date of Birth: [birth year] Admit Date: 08/29/2015 Admitting Physician [name], MD QIW:[id number], [name], MD  Brief narrative:  60 year old female with past medical history of hypertension, type 2 diabetes presented to the ED with worsening hoarseness of voice and abdominal pain. Upon further evaluation with a CT neck she was found to have a laryngeal mask, CT abdomen revealed a left renal mass. She was also found to have UTI with SIRS. She was admitted and started on IV antibiotics, ENT was consulted-patient underwent a biopsy on 1/20. Hospital course has been complicated by development of lower GI bleeding and associated blood loss anemia.   She had an episode of hematemesis on 09/04/2015 after with GI was reconsulted, ENT was called as well, she is post tracheostomy on 09/04/2015.  Subjective:  Patient in bed, currently no chest abdominal pain, no shortness of breath. Feels better. No subjective complaints this morning.  Assessment/Plan:   Metastatic Invasive Sq Cell Laryngeal CA : ENT & oncology consulted, underwent laryngoscopy with biopsy on 1/20 confirming the malignancy.   She underwent tracheostomy placement by ENT on 09/05/2015 to secure her airway. Trach care and speech following along with ENT Dr Constance Holster.  Oncology consulted, I had detailed discussions with oncologist Dr. Alvy Bimler on 09/04/2015. Patient's prognosis is extremely poor, poor candidate for chemotherapy, also most likely has renal cancer. She recommends palliative radiation treatments and consultation with palliative care. Discussed with ENT Dr Constance Holster on 09/08/2015. Outpatient follow-up with [facility] once acute issues have resolved per ENT may be in the next 1-2 weeks.  No PEG tube as she has possible stomach and duodenal ulcers. Now has a PICC line for TNA. Speech to reevaluate For both PMV and swallow eval. Continue with  speech and ENT on 09/07/2015.   Acute blood loss related anemia due to UGI bleed. 5 units of packed RBC transfusion on 09/04/2015, H&H now stable, GI bleed due to likely proximal stomach and duodenal ulcer, status post EGD on 09/04/2015, on 09-07-15 @ 1pm - 1 more dark stool, continue IV PPI continue, GI informed, IR called, her Gastroduodenal Artery was coiled/embolized by IR on 09-07-15, H&H stable, transfuse if HB<7.5.   Left renal mass: Highly suspicious for renal cell carcinoma. Doubt it is related to above. Spoke with Dr. Ree Kida MD on 1/17,he reviewed patient's CT scan in chart - recommends outpatient follow-up with him in the office for consideration of nephrectomy in the future.  However patient will have a laryngeal mass addressed first with palliative radiation treatments, currently too weak for chemotherapy and nephrectomy.    Dysphagia: Secondary to known laryngeal mass. Speech therapy following-completed MBS-recommendations are for dysphagia 1 diet. Await arrival of family-will discuss with patient/family regarding risks of aspiration. She will most likely may require PEG tube in the future once GI Ulcers are better by GI under direct visualization, IR also suggested that GI placed the PEG tube. DW Dr Collene Mares GI 09-07-15.   E coli pyelonephritis with SIRS: Sensitivities noted, finished Rocephin.  Afebrile.  Acute renal failure: Likely prerenal azotemia, resolved after hydration.  Hypertension: Controlled on PRN Hydralazine.   Tobacco & Alcohol abuse: Counseled, completed Ativan per CIWA protocol-no signs of withdrawal  Left adrenal gland nodule: Will need outpatient follow-up, especially given left adrenal mass.  Subpleural nodules in the right middle lobe of right lung and left lower lobe  of left lung: Will need to repeat CT chest in 6 months. Stable on room air, no shortness of breath  Possible CAD: Atherosclerosis seen CT chest. Unfortunately with ongoing lower GI  bleeding-unable to use antiplatelets. Echo with LFEV 48-54%, grade 1 diastolic dysfunction, no wall motion abnormalities. Given numerous above medical problems-not a candidate for any procedures/further workup at this point.  Morbid obesity: Has lost significant amount of weight over the past few months.  Increased taking secretions. They are watery, added scopolamine patch. Afebrile without leukocytosis. Monitor.   Type 2 diabetes: Controlled with CBGs 110-130s. Continue SSI.  CBG (last 3)   Recent Labs  09/10/15 1654 09/10/15 2357 09/11/15 0555  GLUCAP 150* 160* 164*     Disposition: Remains inpatient-Home in next few days  Antimicrobial agents  See below  Anti-infectives    Start     Dose/Rate Route Frequency Ordered Stop   08/29/15 2330  cefTRIAXone (ROCEPHIN) 1 g in dextrose 5 % 50 mL IVPB  Status:  Discontinued     1 g 100 mL/hr over 30 Minutes Intravenous Every 24 hours 08/29/15 2250 09/07/15 1332      DVT Prophylaxis: SCD's, no pharmacological DVT ppx given GI bleed/anemia  Code Status: Full code   Family Communication Daughter  Procedures:  ECHO- TTE  - Left ventricle: The cavity size was normal. Wall thickness was normal. Systolic function was normal. The estimated ejectionfraction was in the range of 55% to 60%. Wall motion was normal;there were no regional wall motion abnormalities. Dopplerparameters are consistent with abnormal left ventricularrelaxation (grade 1 diastolic dysfunction). Doppler parametersare consistent with high ventricular filling pressure. - Aortic valve: There was very mild stenosis. There was trivialregurgitation. - Mitral valve: Calcified annulus. Mildly thickened leaflets . - Left atrium: The atrium was mildly dilated.  Tracheostomy 09/03/2014 by ENT  PICC 09/05/2015  EGD 09/04/2015 by Dr. Havery Moros. With possible ulcers in the duodenum and proximal stomach. Blood and clots also noted.  Gastroduodenal Artery  Embolization - 09-07-15   CONSULTS:   GI, urology , ent, oncology, palliative care  Time spent 30 minutes-Greater than 50% of this time was spent in counseling, explanation of diagnosis, planning of further management, and coordination of care.  MEDICATIONS: Scheduled Meds: . furosemide  10 mg Intravenous Daily  . Influenza vac split quadrivalent PF  0.5 mL Intramuscular Tomorrow-1000  . insulin aspart  0-9 Units Subcutaneous 4 times per day  . scopolamine  1 patch Transdermal Q72H   Continuous Infusions: . Marland KitchenTPN (CLINIMIX-E) Adult 83 mL/hr at 09/10/15 1756   And  . fat emulsion 240 mL (09/10/15 1757)  . lactated ringers 1,000 mL (09/05/15 2145)  . pantoprozole (PROTONIX) infusion 8 mg/hr (09/11/15 0324)   PRN Meds:.acetaminophen, dextromethorphan, hydrALAZINE, lidocaine, LORazepam, morphine injection, [DISCONTINUED] ondansetron **OR** ondansetron (ZOFRAN) IV, oxymetazoline, RESOURCE THICKENUP CLEAR, sodium chloride flush   PHYSICAL EXAM: Vital signs in last 24 hours: Filed Vitals:   09/10/15 2359 09/11/15 0023 09/11/15 0307 09/11/15 0504  BP: 147/71   145/69  Pulse: 90 84 87 81  Temp: 98.4 F (36.9 C)   98.3 F (36.8 C)  TempSrc: Oral   Oral  Resp: '18 17 19 18  '$ Height:      Weight:      SpO2: 91% 97% 97% 95%    Weight change:  Filed Weights   08/29/15 1422  Weight: 113.399 kg (250 lb)   Body mass index is 44.3 kg/(m^2).   Gen Exam: Awake and alert with clear speech. Obese.  Neck: Supple-exam limited by body habitus. No stridor, Trach in place Chest: Good air movement bilaterally clear breath sounds CVS: S1 S2 regular. Abdomen: soft, non tender Extremities: No edema, PICC line in place Neurologic: Non Focal Psych: cooperative Wounds: N/A.   Intake/Output from previous day:  Intake/Output Summary (Last 24 hours) at 09/11/15 0949 Last data filed at 09/11/15 0947  Gross per 24 hour  Intake 2917.59 ml  Output   1850 ml  Net 1067.59 ml     LAB  RESULTS: CBC  Recent Labs Lab 09/05/15 2202 09/06/15 0549 09/06/15 0550  09/07/15 0500 09/07/15 1300 09/07/15 1915 09/08/15 0040 09/08/15 0655 09/09/15 0430  WBC 14.9*  --  12.9*  --  12.2*  --   --   --  13.3* 12.7*  HGB 9.2*  --  8.2*  < > 8.3* 8.7* 8.4* 8.6* 8.3* 8.2*  HCT 27.5*  --  24.6*  < > 24.8* 25.6* 25.8* 26.6* 25.6* 24.6*  PLT 113*  --  107*  --  100*  --   --   --  114* 142*  MCV 83.8  --  84.5  --  87.6  --   --   --  86.8 87.2  MCH 28.0  --  28.2  --  29.3  --   --   --  28.1 29.1  MCHC 33.5  --  33.3  --  33.5  --   --   --  32.4 33.3  RDW 15.6*  --  15.9*  --  16.3*  --   --   --  15.3 15.0  LYMPHSABS  --  1.8  --   --   --   --   --   --   --   --   MONOABS  --  0.8  --   --   --   --   --   --   --   --   EOSABS  --  0.2  --   --   --   --   --   --   --   --   BASOSABS  --  0.0  --   --   --   --   --   --   --   --   < > = values in this interval not displayed.  Chemistries   Recent Labs Lab 09/06/15 0549 09/07/15 0500 09/08/15 0500 09/09/15 0430 09/10/15 0459 09/11/15 0520  NA 142 136 139 139 136 137  K 3.3* 4.1 3.5 3.6 4.0 4.2  CL 108 96* 104 105 103 100*  CO2 '27 23 28 28 28 29  '$ GLUCOSE 135* 433* 147* 134* 161* 172*  BUN '20 13 9 10 13 16  '$ CREATININE 0.93 0.82 0.71 0.62 0.70 0.58  CALCIUM 7.0* 7.2* 7.7* 7.7* 8.1* 8.4*  MG 1.6* 1.9 1.6* 1.8  --  1.7    CBG:  Recent Labs Lab 09/10/15 0738 09/10/15 1144 09/10/15 1654 09/10/15 2357 09/11/15 0555  GLUCAP 137* 138* 150* 160* 164*    GFR Estimated Creatinine Clearance: 91.8 mL/min (by C-G formula based on Cr of 0.58).  Coagulation profile  Recent Labs Lab 09/04/15 1504  INR 1.34    Cardiac Enzymes No results for input(s): CKMB, TROPONINI, MYOGLOBIN in the last 168 hours.  Invalid input(s): CK  Invalid input(s): POCBNP No results for input(s): DDIMER in the last 72 hours. No results for input(s): HGBA1C in the last 72 hours. No results  for input(s): CHOL, HDL, LDLCALC, TRIG,  CHOLHDL, LDLDIRECT in the last 72 hours. No results for input(s): TSH, T4TOTAL, T3FREE, THYROIDAB in the last 72 hours.  Invalid input(s): FREET3 No results for input(s): VITAMINB12, FOLATE, FERRITIN, TIBC, IRON, RETICCTPCT in the last 72 hours. No results for input(s): LIPASE, AMYLASE in the last 72 hours.  Urine Studies No results for input(s): UHGB, CRYS in the last 72 hours.  Invalid input(s): UACOL, UAPR, USPG, UPH, UTP, UGL, UKET, UBIL, UNIT, UROB, ULEU, UEPI, UWBC, URBC, UBAC, CAST, UCOM, BILUA  MICROBIOLOGY: Recent Results (from the past 240 hour(s))  MRSA PCR Screening     Status: None   Collection Time: 09/04/15  7:03 PM  Result Value Ref Range Status   MRSA by PCR NEGATIVE NEGATIVE Final    Comment:        The GeneXpert MRSA Assay (FDA approved for NASAL specimens only), is one component of a comprehensive MRSA colonization surveillance program. It is not intended to diagnose MRSA infection nor to guide or monitor treatment for MRSA infections.     RADIOLOGY STUDIES/RESULTS: Dg Chest 2 View  08/29/2015  CLINICAL DATA:  One month history of chest pain and hoarse voice. EXAM: CHEST  2 VIEW COMPARISON:  None. FINDINGS: The heart is borderline and enlarged. Mild tortuosity of the thoracic aorta. Low lung volumes with mild vascular crowding and streaky basilar atelectasis. There also bronchitic changes which could be acute or chronic. No infiltrates or effusions. The bony thorax is intact. IMPRESSION: Acute versus chronic bronchitic change.  No infiltrates or effusions Electronically Signed   By: Marijo Sanes M.D.   On: 08/29/2015 16:15   Ct Soft Tissue Neck W Contrast  08/29/2015  CLINICAL DATA:  60 year old female with abnormal full waist for 1 month. Abdominal pain. Three days of vomiting. Initial encounter. EXAM: CT NECK WITH CONTRAST TECHNIQUE: Multidetector CT imaging of the neck was performed using the standard protocol following the bolus administration of  intravenous contrast. CONTRAST:  63m OMNIPAQUE IOHEXOL 300 MG/ML SOLN in conjunction with contrast enhanced imaging of the chest, abdomen, and pelvis reported separately. COMPARISON:  CT chest abdomen and pelvis from today reported separately FINDINGS: Pharynx and larynx: Bulky supraglottic laryngeal tumor with marked soft tissue enlargement of the bilateral aryepiglottic folds and anterior commissure up to 17 mm in thickness. Tumor appears inseparable from the undersurface of the strap muscles suggesting extension through the thyroid cartilage bilaterally. Posterior hypopharynx involvement. The epiglottis is thickened, nodular, and distorted. Furthermore, the vallecula is mostly effaced and soft tissue at the base of tongue appears nodular and thickened. All told, tumor encompasses 37 x 40 x 58 mm (AP by transverse by CC). The other laryngeal cartilages appear spared. The palatine tonsils, soft palate, and nasopharynx appear within normal limits. Superior parapharyngeal spaces and retropharyngeal space are within normal limits. Salivary glands: Sublingual space, submandibular glands, and parotid glands are within normal limits. Thyroid: Coarsely calcified 2.3 cm right thyroid nodule. Subcentimeter left lobe nodule. Lymph nodes: Abnormal right level 3 node measuring 9 mm at the level of the thyroid cartilage (series 1, image 57). Small but asymmetric 5 mm right level IIIa lymph node at the level of the hyoid bone on the right (series 1, image 49). Bilateral level 2A nodes appear symmetric measuring 8-9 mm in thickness. No level 4, 5, or level 1 lymphadenopathy. Vascular: Suboptimal intravascular contrast timing, but major vascular structures in the neck and at the skullbase appear to remain patent. Left greater than right carotid  bifurcation calcified atherosclerosis. Limited intracranial: Negative.  Partially empty sella. Visualized orbits: Negative. Mastoids and visualized paranasal sinuses: Visualized paranasal  sinuses and mastoids are clear. Skeleton: Mostly absent dentition. Periapical lucency about the residual left mandible molar. No osseous metastatic disease identified. Upper chest: Reported separately today. IMPRESSION: 1. 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 2. Suspect metastatic nodal disease at level 3 on the right (small but asymmetric up to 9 mm nodes). Bilateral level 2 nodes are indeterminate and also measure up to 9 mm individually. No cystic or necrotic nodes in the neck. 3. See CT chest abdomen and pelvis from today reported separately. Electronically Signed   By: Genevie Ann M.D.   On: 08/29/2015 19:56   Ct Chest W Contrast  08/29/2015  CLINICAL DATA:  60 year old female with 2-3 day history of vomiting. Abdominal pain for the past month. Loss of voice. EXAM: CT CHEST, ABDOMEN, AND PELVIS WITH CONTRAST TECHNIQUE: Multidetector CT imaging of the chest, abdomen and pelvis was performed following the standard protocol during bolus administration of intravenous contrast. CONTRAST:  19m OMNIPAQUE IOHEXOL 300 MG/ML  SOLN COMPARISON:  No priors. FINDINGS: CT CHEST FINDINGS Mediastinum/Lymph Nodes: Heart size is normal. There is no significant pericardial fluid, thickening or pericardial calcification. There is atherosclerosis of the thoracic aorta, the great vessels of the mediastinum and the coronary arteries, including calcified atherosclerotic plaque in the left main, left anterior descending, left circumflex and right coronary arteries. Calcifications of the aortic valve and mitral valve/annulus. No pathologically enlarged mediastinal or hilar lymph nodes. Esophagus is unremarkable in appearance. No axillary lymphadenopathy. Lungs/Pleura: 4 mm subpleural nodule in the lateral segment of the right middle lobe (image 31 of series 4). 4 mm subpleural nodule in the posterior left lower lobe (image 38 of series 4).  No other suspicious appearing pulmonary nodules or masses. No acute consolidative airspace disease. No pleural effusions. Musculoskeletal/Soft Tissues: There are no aggressive appearing lytic or blastic lesions noted in the visualized portions of the skeleton. CT ABDOMEN AND PELVIS FINDINGS Hepatobiliary: No cystic or solid hepatic lesions. No intra or extrahepatic biliary ductal dilatation. 7 mm calcified gallstone lying dependently in the gallbladder. No current findings to suggest an acute cholecystitis at this time. Pancreas: No pancreatic mass. No pancreatic ductal dilatation. No pancreatic or peripancreatic fluid or inflammatory changes. Spleen: Unremarkable. Adrenals/Urinary Tract: In the lower pole of the left kidney there is a 5.9 x 6.1 x 6.3 cm heterogeneously enhancing lesion highly concerning for renal cell carcinoma. The inferior aspect of this lesion comes in very close proximity to the anterior aspect of the left psoas muscle and quadratus lumborum muscle, however, there appears to be an intervening fat plane at this time. The lesion is well separated from the left renal vein. Right kidney and right adrenal gland are normal in appearance. 1.2 x 1.4 cm indeterminate nodule in the lateral limb of the left adrenal gland. No hydroureteronephrosis. Urinary bladder is largely decompressed, but otherwise unremarkable in appearance. Stomach/Bowel: Normal appearance of the stomach. No pathologic dilatation of small bowel or colon. A few scattered colonic diverticulae are noted, without surrounding inflammatory changes to suggest an acute diverticulitis at this time. Appendix is normal. Vascular/Lymphatic: Atherosclerosis throughout the abdominal and pelvic vasculature, without evidence of aneurysm or dissection. Left renal vein is widely patent and separate from the lower pole mass. No lymphadenopathy noted in the abdomen or pelvis. Reproductive: Uterus and ovaries are  unremarkable in appearance. Other: No  significant volume of ascites.  No pneumoperitoneum. Musculoskeletal: There are no aggressive appearing lytic or blastic lesions noted in the visualized portions of the skeleton. IMPRESSION: 1. No acute findings in the abdomen or pelvis to account for the patient's symptoms. 2. However, there is a 5.9 x 6.1 x 6.3 cm heterogeneously enhancing mass in the lower pole of the left kidney highly concerning for renal cell carcinoma. At this time, the lesion appears likely to be encapsulated within Gerota's fascia (although it comes very close to the left psoas and quadratus lumborum musculature), does not involve the left renal vein, and does not appear to be associated with lymphadenopathy. Nonemergent Urologic consultation for surgical resection is strongly recommended in the near future. 3. 1.2 x 1.4 cm indeterminate nodule in the left adrenal gland. Attention on followup studies is recommended, as a metastatic lesion is not excluded. 4. 4 mm subpleural nodules in the right middle lobe and left lower lobe. These are highly nonspecific, and favored to represent subpleural lymph nodes, but attention on followup studies is recommended to ensure stability. 5. Cholelithiasis without evidence of acute cholecystitis at this time. 6. Atherosclerosis, including left main and 3 vessel coronary artery disease. Please note that although the presence of coronary artery calcium documents the presence of coronary artery disease, the severity of this disease and any potential stenosis cannot be assessed on this non-gated CT examination. Assessment for potential risk factor modification, dietary therapy or pharmacologic therapy may be warranted, if clinically indicated. 7. There are calcifications of the aortic valve and mitral valve/annulus. Echocardiographic correlation for evaluation of potential valvular dysfunction may be warranted if clinically indicated. 8. Colonic diverticulosis without evidence of acute diverticulitis at this  time. 9. Additional incidental findings, as above. These results were called by telephone at the time of interpretation on 08/29/2015 at 7:34 pm to Dr. Davonna Belling, who verbally acknowledged these results. Electronically Signed   By: Vinnie Langton M.D.   On: 08/29/2015 19:37   Ct Abdomen Pelvis W Contrast  08/29/2015  CLINICAL DATA:  60 year old female with 2-3 day history of vomiting. Abdominal pain for the past month. Loss of voice. EXAM: CT CHEST, ABDOMEN, AND PELVIS WITH CONTRAST TECHNIQUE: Multidetector CT imaging of the chest, abdomen and pelvis was performed following the standard protocol during bolus administration of intravenous contrast. CONTRAST:  42m OMNIPAQUE IOHEXOL 300 MG/ML  SOLN COMPARISON:  No priors. FINDINGS: CT CHEST FINDINGS Mediastinum/Lymph Nodes: Heart size is normal. There is no significant pericardial fluid, thickening or pericardial calcification. There is atherosclerosis of the thoracic aorta, the great vessels of the mediastinum and the coronary arteries, including calcified atherosclerotic plaque in the left main, left anterior descending, left circumflex and right coronary arteries. Calcifications of the aortic valve and mitral valve/annulus. No pathologically enlarged mediastinal or hilar lymph nodes. Esophagus is unremarkable in appearance. No axillary lymphadenopathy. Lungs/Pleura: 4 mm subpleural nodule in the lateral segment of the right middle lobe (image 31 of series 4). 4 mm subpleural nodule in the posterior left lower lobe (image 38 of series 4). No other suspicious appearing pulmonary nodules or masses. No acute consolidative airspace disease. No pleural effusions. Musculoskeletal/Soft Tissues: There are no aggressive appearing lytic or blastic lesions noted in the visualized portions of the skeleton. CT ABDOMEN AND PELVIS FINDINGS Hepatobiliary: No cystic or solid hepatic lesions. No intra or extrahepatic biliary ductal dilatation. 7 mm calcified gallstone  lying dependently in the gallbladder. No current findings to suggest an acute  cholecystitis at this time. Pancreas: No pancreatic mass. No pancreatic ductal dilatation. No pancreatic or peripancreatic fluid or inflammatory changes. Spleen: Unremarkable. Adrenals/Urinary Tract: In the lower pole of the left kidney there is a 5.9 x 6.1 x 6.3 cm heterogeneously enhancing lesion highly concerning for renal cell carcinoma. The inferior aspect of this lesion comes in very close proximity to the anterior aspect of the left psoas muscle and quadratus lumborum muscle, however, there appears to be an intervening fat plane at this time. The lesion is well separated from the left renal vein. Right kidney and right adrenal gland are normal in appearance. 1.2 x 1.4 cm indeterminate nodule in the lateral limb of the left adrenal gland. No hydroureteronephrosis. Urinary bladder is largely decompressed, but otherwise unremarkable in appearance. Stomach/Bowel: Normal appearance of the stomach. No pathologic dilatation of small bowel or colon. A few scattered colonic diverticulae are noted, without surrounding inflammatory changes to suggest an acute diverticulitis at this time. Appendix is normal. Vascular/Lymphatic: Atherosclerosis throughout the abdominal and pelvic vasculature, without evidence of aneurysm or dissection. Left renal vein is widely patent and separate from the lower pole mass. No lymphadenopathy noted in the abdomen or pelvis. Reproductive: Uterus and ovaries are unremarkable in appearance. Other: No significant volume of ascites.  No pneumoperitoneum. Musculoskeletal: There are no aggressive appearing lytic or blastic lesions noted in the visualized portions of the skeleton. IMPRESSION: 1. No acute findings in the abdomen or pelvis to account for the patient's symptoms. 2. However, there is a 5.9 x 6.1 x 6.3 cm heterogeneously enhancing mass in the lower pole of the left kidney highly concerning for renal cell  carcinoma. At this time, the lesion appears likely to be encapsulated within Gerota's fascia (although it comes very close to the left psoas and quadratus lumborum musculature), does not involve the left renal vein, and does not appear to be associated with lymphadenopathy. Nonemergent Urologic consultation for surgical resection is strongly recommended in the near future. 3. 1.2 x 1.4 cm indeterminate nodule in the left adrenal gland. Attention on followup studies is recommended, as a metastatic lesion is not excluded. 4. 4 mm subpleural nodules in the right middle lobe and left lower lobe. These are highly nonspecific, and favored to represent subpleural lymph nodes, but attention on followup studies is recommended to ensure stability. 5. Cholelithiasis without evidence of acute cholecystitis at this time. 6. Atherosclerosis, including left main and 3 vessel coronary artery disease. Please note that although the presence of coronary artery calcium documents the presence of coronary artery disease, the severity of this disease and any potential stenosis cannot be assessed on this non-gated CT examination. Assessment for potential risk factor modification, dietary therapy or pharmacologic therapy may be warranted, if clinically indicated. 7. There are calcifications of the aortic valve and mitral valve/annulus. Echocardiographic correlation for evaluation of potential valvular dysfunction may be warranted if clinically indicated. 8. Colonic diverticulosis without evidence of acute diverticulitis at this time. 9. Additional incidental findings, as above. These results were called by telephone at the time of interpretation on 08/29/2015 at 7:34 pm to Dr. Davonna Belling, who verbally acknowledged these results. Electronically Signed   By: Vinnie Langton M.D.   On: 08/29/2015 19:37   Ir Angiogram Visceral Selective  09/07/2015  INDICATION: History of duodenal ulcer now with acute upper GI bleed. Please perform  mesenteric arteriogram and potential percutaneous coil embolization. EXAM: 1. ULTRASOUND GUIDANCE FOR ARTERIAL ACCESS. 2. SELECTIVE CELIAC AND SUPERIOR MESENTERIC ARTERIOGRAMS 3. SELECTED PROPER HEPATIC  ARTERIOGRAM 4. SELECTIVE GASTRODUODENAL ARTERIOGRAM AND PERCUTANEOUS COIL EMBOLIZATION MEDICATIONS: None ANESTHESIA/SEDATION: Fentanyl 75 mcg IV; Versed 1.5 mg IV Moderate Sedation Time: 65 minutes; The patient was continuously monitored during the procedure by the interventional radiology nurse under my direct supervision. CONTRAST:  22m OMNIPAQUE IOHEXOL 300 MG/ML  SOLN FLUOROSCOPY TIME:  Fluoroscopy Time: 18 minutes 24 seconds (1595 mGy). COMPLICATIONS: SIR Level A - No therapy, no consequence. The procedure was complicated by non targeted embolization of a a 2 mm x 4 mm non fiber interlock coil within a cranial subsegmental branch vessel of the splenic artery. PROCEDURE: Informed consent was obtained from the patient following explanation of the procedure, risks, benefits and alternatives. The patient understands, agrees and consents for the procedure. All questions were addressed. A time out was performed prior to the initiation of the procedure. Maximal barrier sterile technique utilized including caps, mask, sterile gowns, sterile gloves, large sterile drape, hand hygiene, and Betadine prep. The right femoral head was marked fluoroscopically. Under ultrasound guidance, the right common femoral artery was accessed with a micropuncture kit after the overlying soft tissues were anesthetized with 1% lidocaine. An ultrasound image was saved for documentation purposes. The micropuncture sheath was exchanged for a 5 [place] sheath over a Bentson wire. A closure arteriogram was performed through the side of the sheath confirming access within the right common femoral artery. Over a Bentson wire, a Mickelson catheter was advanced to the level of the thoracic aorta where it was back bled and flushed. The  catheter was then utilized to select the celiac artery and a selective celiac arteriogram was performed. With the use of a Fathom micro wire, a regular Renegade micro catheter was utilized to select the proper hepatic artery and a selective proper hepatic arteriogram was performed. The microcatheter was then utilized to select the gastroduodenal artery and a selective gastroduodenal arteriogram was performed. The micro catheter was advanced further into the distal aspect of the gastroduodenal artery. Following sub selective injection, a 2 mm x 4 mm non fibered interlock coil was attempted to be deployed within the distal aspect of the gastroduodenal artery however it became apparent that the micro catheter I was given was NOT a regular sized catheter but rather was a high-flow catheter. As such, coil became entrapped within the distal aspect of the micro catheter. With some manipulation, the coil was ultimately safely deployed within a cranial subsegmental branch of the splenic artery. The incorrect the microcatheter was discarded and a regular Renegade micro catheter was again utilized to select the gastroduodenal artery. Sub selective injection was performed and the gastroduodenal artery was percutaneously coil embolized with multiple overlapping 2 mm, 3 mm and 4 mm interlock fibered and non-fibered coils to near the vessel's origin. A completion proper hepatic arteriogram was performed. The micro catheter was removed and completion celiac and superior mesenteric arteriograms were performed. Images reviewed and the procedure was terminated. All wires, catheters and sheaths were removed from the patient. Hemostasis was achieved at the right groin access site with deployment of an Exoseal closure device. A dressing was placed. The patient tolerated the procedure well without immediate postprocedural complication. FINDINGS: Celiac arteriogram demonstrates a conventional branching pattern. Superior mesenteric  arteriogram failed to delineate any retrograde contribution to the gastroduodenal artery. Selective proper hepatic arteriogram demonstrates a markedly diminutive and irregular appearing gastroduodenal artery which was confirmed with selective gastroduodenal arteriogram. Ultimately successful percutaneous coil embolization of the gastroduodenal artery to near the vessels origin. Note, procedure was complicated by  non target embolization of a cranial subsegmental branch vessel of the splenic artery. Completion proper hepatic arteriogram and demonstrates complete occlusion of the GDA. Completion celiac arteriogram demonstrates a hypertrophied gastroepiploic artery arising from the left gastric artery without definitive contribution to the duodenal bulb. IMPRESSION: 1. Technically successful prophylactic percutaneous coil embolization of the gastroduodenal artery for acute upper GI bleed. 2. Procedure complicated by non target embolization of a cranial subsegmental branch vessel of the splenic artery secondary to mismatch of catheter sizing. This non target coil is felt to be of no clinical significance. Above findings were discussed with Dr. Lala Lund , at the time of procedure completion. Electronically Signed   By: Sandi Mariscal M.D.   On: 09/07/2015 18:09   Ir Angiogram Visceral Selective  09/07/2015  INDICATION: History of duodenal ulcer now with acute upper GI bleed. Please perform mesenteric arteriogram and potential percutaneous coil embolization. EXAM: 1. ULTRASOUND GUIDANCE FOR ARTERIAL ACCESS. 2. SELECTIVE CELIAC AND SUPERIOR MESENTERIC ARTERIOGRAMS 3. SELECTED PROPER HEPATIC ARTERIOGRAM 4. SELECTIVE GASTRODUODENAL ARTERIOGRAM AND PERCUTANEOUS COIL EMBOLIZATION MEDICATIONS: None ANESTHESIA/SEDATION: Fentanyl 75 mcg IV; Versed 1.5 mg IV Moderate Sedation Time: 65 minutes; The patient was continuously monitored during the procedure by the interventional radiology nurse under my direct supervision.  CONTRAST:  57m OMNIPAQUE IOHEXOL 300 MG/ML  SOLN FLUOROSCOPY TIME:  Fluoroscopy Time: 18 minutes 24 seconds (1595 mGy). COMPLICATIONS: SIR Level A - No therapy, no consequence. The procedure was complicated by non targeted embolization of a a 2 mm x 4 mm non fiber interlock coil within a cranial subsegmental branch vessel of the splenic artery. PROCEDURE: Informed consent was obtained from the patient following explanation of the procedure, risks, benefits and alternatives. The patient understands, agrees and consents for the procedure. All questions were addressed. A time out was performed prior to the initiation of the procedure. Maximal barrier sterile technique utilized including caps, mask, sterile gowns, sterile gloves, large sterile drape, hand hygiene, and Betadine prep. The right femoral head was marked fluoroscopically. Under ultrasound guidance, the right common femoral artery was accessed with a micropuncture kit after the overlying soft tissues were anesthetized with 1% lidocaine. An ultrasound image was saved for documentation purposes. The micropuncture sheath was exchanged for a 5 [place] sheath over a Bentson wire. A closure arteriogram was performed through the side of the sheath confirming access within the right common femoral artery. Over a Bentson wire, a Mickelson catheter was advanced to the level of the thoracic aorta where it was back bled and flushed. The catheter was then utilized to select the celiac artery and a selective celiac arteriogram was performed. With the use of a Fathom micro wire, a regular Renegade micro catheter was utilized to select the proper hepatic artery and a selective proper hepatic arteriogram was performed. The microcatheter was then utilized to select the gastroduodenal artery and a selective gastroduodenal arteriogram was performed. The micro catheter was advanced further into the distal aspect of the gastroduodenal artery. Following sub selective  injection, a 2 mm x 4 mm non fibered interlock coil was attempted to be deployed within the distal aspect of the gastroduodenal artery however it became apparent that the micro catheter I was given was NOT a regular sized catheter but rather was a high-flow catheter. As such, coil became entrapped within the distal aspect of the micro catheter. With some manipulation, the coil was ultimately safely deployed within a cranial subsegmental branch of the splenic artery. The incorrect the microcatheter was discarded and a  regular Renegade micro catheter was again utilized to select the gastroduodenal artery. Sub selective injection was performed and the gastroduodenal artery was percutaneously coil embolized with multiple overlapping 2 mm, 3 mm and 4 mm interlock fibered and non-fibered coils to near the vessel's origin. A completion proper hepatic arteriogram was performed. The micro catheter was removed and completion celiac and superior mesenteric arteriograms were performed. Images reviewed and the procedure was terminated. All wires, catheters and sheaths were removed from the patient. Hemostasis was achieved at the right groin access site with deployment of an Exoseal closure device. A dressing was placed. The patient tolerated the procedure well without immediate postprocedural complication. FINDINGS: Celiac arteriogram demonstrates a conventional branching pattern. Superior mesenteric arteriogram failed to delineate any retrograde contribution to the gastroduodenal artery. Selective proper hepatic arteriogram demonstrates a markedly diminutive and irregular appearing gastroduodenal artery which was confirmed with selective gastroduodenal arteriogram. Ultimately successful percutaneous coil embolization of the gastroduodenal artery to near the vessels origin. Note, procedure was complicated by non target embolization of a cranial subsegmental branch vessel of the splenic artery. Completion proper hepatic  arteriogram and demonstrates complete occlusion of the GDA. Completion celiac arteriogram demonstrates a hypertrophied gastroepiploic artery arising from the left gastric artery without definitive contribution to the duodenal bulb. IMPRESSION: 1. Technically successful prophylactic percutaneous coil embolization of the gastroduodenal artery for acute upper GI bleed. 2. Procedure complicated by non target embolization of a cranial subsegmental branch vessel of the splenic artery secondary to mismatch of catheter sizing. This non target coil is felt to be of no clinical significance. Above findings were discussed with Dr. Lala Lund , at the time of procedure completion. Electronically Signed   By: Sandi Mariscal M.D.   On: 09/07/2015 18:09   Ir Angiogram Selective Each Additional Vessel  09/07/2015  INDICATION: History of duodenal ulcer now with acute upper GI bleed. Please perform mesenteric arteriogram and potential percutaneous coil embolization. EXAM: 1. ULTRASOUND GUIDANCE FOR ARTERIAL ACCESS. 2. SELECTIVE CELIAC AND SUPERIOR MESENTERIC ARTERIOGRAMS 3. SELECTED PROPER HEPATIC ARTERIOGRAM 4. SELECTIVE GASTRODUODENAL ARTERIOGRAM AND PERCUTANEOUS COIL EMBOLIZATION MEDICATIONS: None ANESTHESIA/SEDATION: Fentanyl 75 mcg IV; Versed 1.5 mg IV Moderate Sedation Time: 65 minutes; The patient was continuously monitored during the procedure by the interventional radiology nurse under my direct supervision. CONTRAST:  52m OMNIPAQUE IOHEXOL 300 MG/ML  SOLN FLUOROSCOPY TIME:  Fluoroscopy Time: 18 minutes 24 seconds (1595 mGy). COMPLICATIONS: SIR Level A - No therapy, no consequence. The procedure was complicated by non targeted embolization of a a 2 mm x 4 mm non fiber interlock coil within a cranial subsegmental branch vessel of the splenic artery. PROCEDURE: Informed consent was obtained from the patient following explanation of the procedure, risks, benefits and alternatives. The patient understands, agrees and  consents for the procedure. All questions were addressed. A time out was performed prior to the initiation of the procedure. Maximal barrier sterile technique utilized including caps, mask, sterile gowns, sterile gloves, large sterile drape, hand hygiene, and Betadine prep. The right femoral head was marked fluoroscopically. Under ultrasound guidance, the right common femoral artery was accessed with a micropuncture kit after the overlying soft tissues were anesthetized with 1% lidocaine. An ultrasound image was saved for documentation purposes. The micropuncture sheath was exchanged for a 5 [place] sheath over a Bentson wire. A closure arteriogram was performed through the side of the sheath confirming access within the right common femoral artery. Over a Bentson wire, a Mickelson catheter was advanced to the level of the  thoracic aorta where it was back bled and flushed. The catheter was then utilized to select the celiac artery and a selective celiac arteriogram was performed. With the use of a Fathom micro wire, a regular Renegade micro catheter was utilized to select the proper hepatic artery and a selective proper hepatic arteriogram was performed. The microcatheter was then utilized to select the gastroduodenal artery and a selective gastroduodenal arteriogram was performed. The micro catheter was advanced further into the distal aspect of the gastroduodenal artery. Following sub selective injection, a 2 mm x 4 mm non fibered interlock coil was attempted to be deployed within the distal aspect of the gastroduodenal artery however it became apparent that the micro catheter I was given was NOT a regular sized catheter but rather was a high-flow catheter. As such, coil became entrapped within the distal aspect of the micro catheter. With some manipulation, the coil was ultimately safely deployed within a cranial subsegmental branch of the splenic artery. The incorrect the microcatheter was discarded and  a regular Renegade micro catheter was again utilized to select the gastroduodenal artery. Sub selective injection was performed and the gastroduodenal artery was percutaneously coil embolized with multiple overlapping 2 mm, 3 mm and 4 mm interlock fibered and non-fibered coils to near the vessel's origin. A completion proper hepatic arteriogram was performed. The micro catheter was removed and completion celiac and superior mesenteric arteriograms were performed. Images reviewed and the procedure was terminated. All wires, catheters and sheaths were removed from the patient. Hemostasis was achieved at the right groin access site with deployment of an Exoseal closure device. A dressing was placed. The patient tolerated the procedure well without immediate postprocedural complication. FINDINGS: Celiac arteriogram demonstrates a conventional branching pattern. Superior mesenteric arteriogram failed to delineate any retrograde contribution to the gastroduodenal artery. Selective proper hepatic arteriogram demonstrates a markedly diminutive and irregular appearing gastroduodenal artery which was confirmed with selective gastroduodenal arteriogram. Ultimately successful percutaneous coil embolization of the gastroduodenal artery to near the vessels origin. Note, procedure was complicated by non target embolization of a cranial subsegmental branch vessel of the splenic artery. Completion proper hepatic arteriogram and demonstrates complete occlusion of the GDA. Completion celiac arteriogram demonstrates a hypertrophied gastroepiploic artery arising from the left gastric artery without definitive contribution to the duodenal bulb. IMPRESSION: 1. Technically successful prophylactic percutaneous coil embolization of the gastroduodenal artery for acute upper GI bleed. 2. Procedure complicated by non target embolization of a cranial subsegmental branch vessel of the splenic artery secondary to mismatch of catheter sizing. This  non target coil is felt to be of no clinical significance. Above findings were discussed with Dr. Susa Raring , at the time of procedure completion. Electronically Signed   By: Simonne Come M.D.   On: 09/07/2015 18:09   Ir Angiogram Follow Up Study  09/07/2015  INDICATION: History of duodenal ulcer now with acute upper GI bleed. Please perform mesenteric arteriogram and potential percutaneous coil embolization. EXAM: 1. ULTRASOUND GUIDANCE FOR ARTERIAL ACCESS. 2. SELECTIVE CELIAC AND SUPERIOR MESENTERIC ARTERIOGRAMS 3. SELECTED PROPER HEPATIC ARTERIOGRAM 4. SELECTIVE GASTRODUODENAL ARTERIOGRAM AND PERCUTANEOUS COIL EMBOLIZATION MEDICATIONS: None ANESTHESIA/SEDATION: Fentanyl 75 mcg IV; Versed 1.5 mg IV Moderate Sedation Time: 65 minutes; The patient was continuously monitored during the procedure by the interventional radiology nurse under my direct supervision. CONTRAST:  59mL OMNIPAQUE IOHEXOL 300 MG/ML  SOLN FLUOROSCOPY TIME:  Fluoroscopy Time: 18 minutes 24 seconds (1595 mGy). COMPLICATIONS: SIR Level A - No therapy, no consequence. The procedure was complicated by  non targeted embolization of a a 2 mm x 4 mm non fiber interlock coil within a cranial subsegmental branch vessel of the splenic artery. PROCEDURE: Informed consent was obtained from the patient following explanation of the procedure, risks, benefits and alternatives. The patient understands, agrees and consents for the procedure. All questions were addressed. A time out was performed prior to the initiation of the procedure. Maximal barrier sterile technique utilized including caps, mask, sterile gowns, sterile gloves, large sterile drape, hand hygiene, and Betadine prep. The right femoral head was marked fluoroscopically. Under ultrasound guidance, the right common femoral artery was accessed with a micropuncture kit after the overlying soft tissues were anesthetized with 1% lidocaine. An ultrasound image was saved for documentation purposes.  The micropuncture sheath was exchanged for a 5 [place] vascular sheath over a Bentson wire. A closure arteriogram was performed through the side of the sheath confirming access within the right common femoral artery. Over a Bentson wire, a Mickelson catheter was advanced to the level of the thoracic aorta where it was back bled and flushed. The catheter was then utilized to select the celiac artery and a selective celiac arteriogram was performed. With the use of a Fathom micro wire, a regular Renegade micro catheter was utilized to select the proper hepatic artery and a selective proper hepatic arteriogram was performed. The microcatheter was then utilized to select the gastroduodenal artery and a selective gastroduodenal arteriogram was performed. The micro catheter was advanced further into the distal aspect of the gastroduodenal artery. Following sub selective injection, a 2 mm x 4 mm non fibered interlock coil was attempted to be deployed within the distal aspect of the gastroduodenal artery however it became apparent that the micro catheter I was given was NOT a regular sized catheter but rather was a high-flow catheter. As such, coil became entrapped within the distal aspect of the micro catheter. With some manipulation, the coil was ultimately safely deployed within a cranial subsegmental branch of the splenic artery. The incorrect the microcatheter was discarded and a regular Renegade micro catheter was again utilized to select the gastroduodenal artery. Sub selective injection was performed and the gastroduodenal artery was percutaneously coil embolized with multiple overlapping 2 mm, 3 mm and 4 mm interlock fibered and non-fibered coils to near the vessel's origin. A completion proper hepatic arteriogram was performed. The micro catheter was removed and completion celiac and superior mesenteric arteriograms were performed. Images reviewed and the procedure was terminated. All wires, catheters and sheaths  were removed from the patient. Hemostasis was achieved at the right groin access site with deployment of an Exoseal closure device. A dressing was placed. The patient tolerated the procedure well without immediate postprocedural complication. FINDINGS: Celiac arteriogram demonstrates a conventional branching pattern. Superior mesenteric arteriogram failed to delineate any retrograde contribution to the gastroduodenal artery. Selective proper hepatic arteriogram demonstrates a markedly diminutive and irregular appearing gastroduodenal artery which was confirmed with selective gastroduodenal arteriogram. Ultimately successful percutaneous coil embolization of the gastroduodenal artery to near the vessels origin. Note, procedure was complicated by non target embolization of a cranial subsegmental branch vessel of the splenic artery. Completion proper hepatic arteriogram and demonstrates complete occlusion of the GDA. Completion celiac arteriogram demonstrates a hypertrophied gastroepiploic artery arising from the left gastric artery without definitive contribution to the duodenal bulb. IMPRESSION: 1. Technically successful prophylactic percutaneous coil embolization of the gastroduodenal artery for acute upper GI bleed. 2. Procedure complicated by non target embolization of a cranial subsegmental branch vessel of  the splenic artery secondary to mismatch of catheter sizing. This non target coil is felt to be of no clinical significance. Above findings were discussed with Dr. Lala Lund , at the time of procedure completion. Electronically Signed   By: Sandi Mariscal M.D.   On: 09/07/2015 18:09   Ir US Guide Vasc Access Right  09/07/2015  INDICATION: History of duodenal ulcer now with acute upper GI bleed. Please perform mesenteric arteriogram and potential percutaneous coil embolization. EXAM: 1. ULTRASOUND GUIDANCE FOR ARTERIAL ACCESS. 2. SELECTIVE CELIAC AND SUPERIOR MESENTERIC ARTERIOGRAMS 3. SELECTED PROPER  HEPATIC ARTERIOGRAM 4. SELECTIVE GASTRODUODENAL ARTERIOGRAM AND PERCUTANEOUS COIL EMBOLIZATION MEDICATIONS: None ANESTHESIA/SEDATION: Fentanyl 75 mcg IV; Versed 1.5 mg IV Moderate Sedation Time: 65 minutes; The patient was continuously monitored during the procedure by the interventional radiology nurse under my direct supervision. CONTRAST:  6m OMNIPAQUE IOHEXOL 300 MG/ML  SOLN FLUOROSCOPY TIME:  Fluoroscopy Time: 18 minutes 24 seconds (1595 mGy). COMPLICATIONS: SIR Level A - No therapy, no consequence. The procedure was complicated by non targeted embolization of a a 2 mm x 4 mm non fiber interlock coil within a cranial subsegmental branch vessel of the splenic artery. PROCEDURE: Informed consent was obtained from the patient following explanation of the procedure, risks, benefits and alternatives. The patient understands, agrees and consents for the procedure. All questions were addressed. A time out was performed prior to the initiation of the procedure. Maximal barrier sterile technique utilized including caps, mask, sterile gowns, sterile gloves, large sterile drape, hand hygiene, and Betadine prep. The right femoral head was marked fluoroscopically. Under ultrasound guidance, the right common femoral artery was accessed with a micropuncture kit after the overlying soft tissues were anesthetized with 1% lidocaine. An ultrasound image was saved for documentation purposes. The micropuncture sheath was exchanged for a 5 [place] sheath over a Bentson wire. A closure arteriogram was performed through the side of the sheath confirming access within the right common femoral artery. Over a Bentson wire, a Mickelson catheter was advanced to the level of the thoracic aorta where it was back bled and flushed. The catheter was then utilized to select the celiac artery and a selective celiac arteriogram was performed. With the use of a Fathom micro wire, a regular Renegade micro catheter was utilized to select  the proper hepatic artery and a selective proper hepatic arteriogram was performed. The microcatheter was then utilized to select the gastroduodenal artery and a selective gastroduodenal arteriogram was performed. The micro catheter was advanced further into the distal aspect of the gastroduodenal artery. Following sub selective injection, a 2 mm x 4 mm non fibered interlock coil was attempted to be deployed within the distal aspect of the gastroduodenal artery however it became apparent that the micro catheter I was given was NOT a regular sized catheter but rather was a high-flow catheter. As such, coil became entrapped within the distal aspect of the micro catheter. With some manipulation, the coil was ultimately safely deployed within a cranial subsegmental branch of the splenic artery. The incorrect the microcatheter was discarded and a regular Renegade micro catheter was again utilized to select the gastroduodenal artery. Sub selective injection was performed and the gastroduodenal artery was percutaneously coil embolized with multiple overlapping 2 mm, 3 mm and 4 mm interlock fibered and non-fibered coils to near the vessel's origin. A completion proper hepatic arteriogram was performed. The micro catheter was removed and completion celiac and superior mesenteric arteriograms were performed. Images reviewed and the procedure was terminated. All  wires, catheters and sheaths were removed from the patient. Hemostasis was achieved at the right groin access site with deployment of an Exoseal closure device. A dressing was placed. The patient tolerated the procedure well without immediate postprocedural complication. FINDINGS: Celiac arteriogram demonstrates a conventional branching pattern. Superior mesenteric arteriogram failed to delineate any retrograde contribution to the gastroduodenal artery. Selective proper hepatic arteriogram demonstrates a markedly diminutive and irregular appearing gastroduodenal artery  which was confirmed with selective gastroduodenal arteriogram. Ultimately successful percutaneous coil embolization of the gastroduodenal artery to near the vessels origin. Note, procedure was complicated by non target embolization of a cranial subsegmental branch vessel of the splenic artery. Completion proper hepatic arteriogram and demonstrates complete occlusion of the GDA. Completion celiac arteriogram demonstrates a hypertrophied gastroepiploic artery arising from the left gastric artery without definitive contribution to the duodenal bulb. IMPRESSION: 1. Technically successful prophylactic percutaneous coil embolization of the gastroduodenal artery for acute upper GI bleed. 2. Procedure complicated by non target embolization of a cranial subsegmental branch vessel of the splenic artery secondary to mismatch of catheter sizing. This non target coil is felt to be of no clinical significance. Above findings were discussed with Dr. Lala Lund , at the time of procedure completion. Electronically Signed   By: Sandi Mariscal M.D.   On: 09/07/2015 18:09   Dg Chest Port 1 View  09/04/2015  CLINICAL DATA:  Central line placement. EXAM: PORTABLE CHEST 1 VIEW COMPARISON:  08/29/2015 FINDINGS: Endotracheal tube terminates 6 cm above the carina. Right internal jugular approach central venous catheter seen with tip at the expected location of superior vena cava. Cardiomediastinal silhouette is enlarged, likely exaggerated by portable technique and rotation. Mediastinal contours appear intact. There is no evidence of focal airspace consolidation, pleural effusion or pneumothorax. Lung volumes are low. Osseous structures are without acute abnormality. Soft tissues are grossly normal. IMPRESSION: Low lung volumes. Endotracheal tube 6 cm above the carina. Advancement with 2 cm may be considered. Right-sided central venous catheter in satisfactory position radiographically. Electronically Signed   By: Fidela Salisbury  M.D.   On: 09/04/2015 16:06   Dg Swallowing Func-speech Pathology  09/08/2015  Objective Swallowing Evaluation: Type of Study: MBS-Modified Barium Swallow Study Patient Details Name: Ashley Ramsey MRN: [id number] Date of Birth: March 25, 1956 Today's Date: 09/08/2015 Time: SLP Start Time (ACUTE ONLY): 1032-SLP Stop Time (ACUTE ONLY): 1058 SLP Time Calculation (min) (ACUTE ONLY): 26 min Past Medical History: Past Medical History Diagnosis Date . Thyroid disease  . Hypertension  . Neck mass hospitalized 08/29/2015 . Type II diabetes mellitus ([place])  . Left kidney mass  . Anemia, chronic disease  Past Surgical History: Past Surgical History Procedure Laterality Date . No past surgeries   . Esophagogastroduodenoscopy N/A 09/04/2015   Procedure: ESOPHAGOGASTRODUODENOSCOPY (EGD);  Surgeon: Manus Gunning, MD;  Location: [place];  Service: Gastroenterology;  Laterality: N/A; . Direct laryngoscopy N/A 09/02/2015   Procedure: DIRECT LARYNGOSCOPY WITH BIOPSY;  Surgeon: Izora Gala, MD;  Location: [place];  Service: ENT;  Laterality: N/A; . Esophagoscopy N/A 09/02/2015   Procedure: ESOPHAGOSCOPY;  Surgeon: Izora Gala, MD;  Location: [place];  Service: ENT;  Laterality: N/A; . Tracheostomy tube placement N/A 09/04/2015   Procedure: TRACHEOSTOMY;  Surgeon: Izora Gala, MD;  Location: [facility];  Service: ENT;  Laterality: N/A; . Direct laryngoscopy  09/04/2015   Procedure: DIRECT View LARYNGOSCOPY with cautery of of tumor;  Surgeon: Izora Gala, MD;  Location: [facility];  Service: ENT;; HPI: Pt is 60 y.o. female  with h/o hypertension and NIDDM. Pt presented to ED with voice changes, difficult swallowing and abdominal discomfort that have progressed over the last month. 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 MBS 1/21 recommended Dys 1, pudding liquids. GI bleed, made NPO, swallow assessment re-ordered. SLP recommended repeat MBS  Subjective: pt alert, eager for food/drink Assessment  / Plan / Recommendation CHL IP CLINICAL IMPRESSIONS 09/08/2015 Therapy Diagnosis Moderate pharyngeal phase dysphagia;Severe pharyngeal phase dysphagia Clinical Impression Pt's swallow function decreased from initial MBS 1/21. Swallow function assessed without speaking valve due to poor tolerance during yesterday's treatment session. She exhibited moderate-severe anatomically based pharyngeal dysphagia resulting in sensory and motor deficits. Delayed initiation of swallow and poor base of tongue retraction lead to moderate vallecular residue that was observed throughout the session. Aspiration was noted of both puree and honey thick liquids and pt did not sense aspirated bolus. Moderate verbal and visual cues to cough and swallow multiple times ineffective although finger occlusion of trach increased pressure which helped to significantly remove penetrates. 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 Recommend NPO except for free water protocol under strict guidelines.   Impact on safety and function Severe aspiration risk;Risk for inadequate nutrition/hydration   CHL IP TREATMENT RECOMMENDATION 09/08/2015 Treatment Recommendations Therapy as outlined in treatment plan below   Prognosis 09/08/2015 Prognosis for Safe Diet Advancement Guarded Barriers to Reach Goals Severity of deficits Barriers/Prognosis Comment -- CHL IP DIET RECOMMENDATION 09/08/2015 SLP Diet Recommendations NPO Liquid Administration via -- Medication Administration Via alternative means Compensations -- Postural Changes --   CHL IP OTHER RECOMMENDATIONS 09/08/2015 Recommended Consults -- Oral Care Recommendations Oral care QID Other Recommendations --   CHL IP FOLLOW UP RECOMMENDATIONS 09/08/2015 Follow up Recommendations 24 hour supervision/assistance   CHL IP FREQUENCY AND DURATION 09/08/2015 Speech Therapy Frequency (ACUTE ONLY) min 2x/week Treatment Duration 2 weeks      CHL IP ORAL PHASE 09/08/2015 Oral  Phase WFL Oral - Pudding Teaspoon -- Oral - Pudding Cup -- Oral - Honey Teaspoon -- Oral - Honey Cup -- Oral - Nectar Teaspoon -- Oral - Nectar Cup -- Oral - Nectar Straw -- Oral - Thin Teaspoon -- Oral - Thin Cup -- Oral - Thin Straw -- Oral - Puree -- Oral - Mech Soft -- Oral - Regular -- Oral - Multi-Consistency -- Oral - Pill -- Oral Phase - Comment --  CHL IP PHARYNGEAL PHASE 09/08/2015 Pharyngeal Phase Impaired Pharyngeal- Pudding Teaspoon NT Pharyngeal Material enters airway, CONTACTS cords and not ejected out;Material enters airway, passes BELOW cords without attempt by patient to eject out (silent aspiration) Pharyngeal- Pudding Cup -- Pharyngeal -- Pharyngeal- Honey Teaspoon Delayed swallow initiation-vallecula;Reduced anterior laryngeal mobility;Reduced airway/laryngeal closure;Reduced laryngeal elevation;Reduced tongue base retraction;Penetration/Apiration after swallow;Pharyngeal residue - valleculae Pharyngeal Material enters airway, CONTACTS cords and not ejected out Pharyngeal- Honey Cup Delayed swallow initiation-vallecula;Reduced anterior laryngeal mobility;Reduced laryngeal elevation;Reduced airway/laryngeal closure;Reduced tongue base retraction;Penetration/Apiration after swallow;Moderate aspiration;Pharyngeal residue - valleculae;Compensatory strategies attempted (with notebox) Pharyngeal Material enters airway, passes BELOW cords without attempt by patient to eject out (silent aspiration) Pharyngeal- Nectar Teaspoon NT Pharyngeal -- Pharyngeal- Nectar Cup -- Pharyngeal -- Pharyngeal- Nectar Straw -- Pharyngeal -- Pharyngeal- Thin Teaspoon NT Pharyngeal -- Pharyngeal- Thin Cup -- Pharyngeal -- Pharyngeal- Thin Straw NT Pharyngeal -- Pharyngeal- Puree Delayed swallow initiation-vallecula;Pharyngeal residue - valleculae;Penetration/Apiration after swallow;Reduced anterior laryngeal mobility;Reduced airway/laryngeal closure;Reduced tongue base retraction Pharyngeal Material enters airway, passes  BELOW cords without  attempt by patient to eject out (silent aspiration) Pharyngeal- Mechanical Soft -- Pharyngeal -- Pharyngeal- Regular -- Pharyngeal -- Pharyngeal- Multi-consistency -- Pharyngeal -- Pharyngeal- Pill -- Pharyngeal -- Pharyngeal Comment --  CHL IP CERVICAL ESOPHAGEAL PHASE 09/08/2015 Cervical Esophageal Phase WFL Pudding Teaspoon -- Pudding Cup -- Honey Teaspoon -- Honey Cup -- Nectar Teaspoon -- Nectar Cup -- Nectar Straw -- Thin Teaspoon -- Thin Cup -- Thin Straw -- Puree -- Mechanical Soft -- Regular -- Multi-consistency -- Pill -- Cervical Esophageal Comment -- No flowsheet data found. [place] Siren 09/08/2015, 2:11 PM Orbie Pyo Colvin Caroli.Ed CCC-SLP Pager [phone]              Dg Swallowing Func-speech Pathology  09/03/2015  Objective Swallowing Evaluation:   Patient Details Name: Ashley Ramsey MRN: [id number] Date of Birth: [birth year] Today's Date: 09/03/2015 Time: SLP Start Time (ACUTE ONLY): 1124-SLP Stop Time (ACUTE ONLY): 1143 SLP Time Calculation (min) (ACUTE ONLY): 19 min Past Medical History: Past Medical History Diagnosis Date . Thyroid disease  . Hypertension  . Neck mass hospitalized 08/29/2015 . Type II diabetes mellitus ([place])  . Left kidney mass  . Anemia, chronic disease  Past Surgical History: Past Surgical History Procedure Laterality Date . No past surgeries   HPI: Pt is 60 y.o. female with h/o hypertension and NIDDM. Pt presented to ED with voice changes, difficult swallowing and abdominal discomfort that have progressed over the last month. 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 Subjective: pt alert, eager for food/drink Assessment / Plan / Recommendation CHL IP CLINICAL IMPRESSIONS 09/03/2015 Therapy Diagnosis Severe pharyngeal phase dysphagia Clinical Impression Pt has a severe pharyngeal dysphaghia secondary to presence of supraglottic mass, which impedes bolus flow and decreased airway closure. All consistencies  enter the airway during the swallow even with tsp-sized boluses. Aspiration is usually sensed, but she is not able to expel aspirates from her trachea. A chin tuck paired with an immediate cough facilitates clearance through the pharynx and clearance of penetrates with all consistencies tested; however, all liquids are then re-penetrated and aspirated after the swallow. Purees do not re-enter the airway, although aspiration risk remains due to residue. Recommend Dys 1 diet and pudding thick liquids with a chin tuck and immediate cough following all bites. Impact on safety and function Moderate aspiration risk;Risk for inadequate nutrition/hydration   CHL IP TREATMENT RECOMMENDATION 09/03/2015 Treatment Recommendations Therapy as outlined in treatment plan below   Prognosis 09/03/2015 Prognosis for Safe Diet Advancement Guarded Barriers to Reach Goals -- Barriers/Prognosis Comment -- CHL IP DIET RECOMMENDATION 09/03/2015 SLP Diet Recommendations Dysphagia 1 (Puree) solids;Pudding thick liquid Liquid Administration via Spoon Medication Administration Crushed with puree Compensations Slow rate;Small sips/bites;Chin tuck;Hard cough after swallow Postural Changes Remain semi-upright after after feeds/meals (Comment);Seated upright at 90 degrees   CHL IP OTHER RECOMMENDATIONS 09/03/2015 Recommended Consults -- Oral Care Recommendations Oral care BID Other Recommendations Order thickener from pharmacy;Prohibited food (jello, ice cream, thin soups);Remove water pitcher   CHL IP FOLLOW UP RECOMMENDATIONS 09/03/2015 Follow up Recommendations Outpatient SLP;24 hour supervision/assistance   CHL IP FREQUENCY AND DURATION 09/03/2015 Speech Therapy Frequency (ACUTE ONLY) min 2x/week Treatment Duration 2 weeks      CHL IP ORAL PHASE 09/03/2015 Oral Phase WFL Oral - Pudding Teaspoon -- Oral - Pudding Cup -- Oral - Honey Teaspoon -- Oral - Honey Cup -- Oral - Nectar Teaspoon -- Oral - Nectar Cup -- Oral - Nectar Straw -- Oral -  Thin  Teaspoon -- Oral - Thin Cup -- Oral - Thin Straw -- Oral - Puree -- Oral - Mech Soft -- Oral - Regular -- Oral - Multi-Consistency -- Oral - Pill -- Oral Phase - Comment --  CHL IP PHARYNGEAL PHASE 09/03/2015 Pharyngeal Phase Impaired Pharyngeal- Pudding Teaspoon -- Pharyngeal -- Pharyngeal- Pudding Cup -- Pharyngeal -- Pharyngeal- Honey Teaspoon Reduced airway/laryngeal closure;Penetration/Aspiration during swallow;Penetration/Apiration after swallow;Pharyngeal residue - pyriform;Pharyngeal residue - posterior pharnyx;Compensatory strategies attempted (with notebox) Pharyngeal Material enters airway, passes BELOW cords and not ejected out despite cough attempt by patient Pharyngeal- Honey Cup -- Pharyngeal -- Pharyngeal- Nectar Teaspoon Reduced airway/laryngeal closure;Penetration/Aspiration during swallow;Penetration/Apiration after swallow;Pharyngeal residue - pyriform;Pharyngeal residue - posterior pharnyx;Compensatory strategies attempted (with notebox) Pharyngeal Material enters airway, passes BELOW cords and not ejected out despite cough attempt by patient Pharyngeal- Nectar Cup -- Pharyngeal -- Pharyngeal- Nectar Straw -- Pharyngeal -- Pharyngeal- Thin Teaspoon Reduced airway/laryngeal closure;Penetration/Aspiration during swallow;Penetration/Apiration after swallow;Pharyngeal residue - pyriform;Pharyngeal residue - posterior pharnyx;Compensatory strategies attempted (with notebox) Pharyngeal Material enters airway, passes BELOW cords and not ejected out despite cough attempt by patient Pharyngeal- Thin Cup -- Pharyngeal -- Pharyngeal- Thin Straw Reduced airway/laryngeal closure;Penetration/Aspiration during swallow;Penetration/Apiration after swallow;Pharyngeal residue - pyriform;Pharyngeal residue - posterior pharnyx;Compensatory strategies attempted (with notebox) Pharyngeal Material enters airway, passes BELOW cords and not ejected out despite cough attempt by patient Pharyngeal- Puree Reduced  airway/laryngeal closure;Penetration/Aspiration during swallow;Compensatory strategies attempted (with notebox);Pharyngeal residue - posterior pharnyx Pharyngeal Material enters airway, remains ABOVE vocal cords and not ejected out Pharyngeal- Mechanical Soft -- Pharyngeal -- Pharyngeal- Regular -- Pharyngeal -- Pharyngeal- Multi-consistency -- Pharyngeal -- Pharyngeal- Pill -- Pharyngeal -- Pharyngeal Comment --  CHL IP CERVICAL ESOPHAGEAL PHASE 09/03/2015 Cervical Esophageal Phase (No Data) Pudding Teaspoon -- Pudding Cup -- Honey Teaspoon -- Honey Cup -- Nectar Teaspoon -- Nectar Cup -- Nectar Straw -- Thin Teaspoon -- Thin Cup -- Thin Straw -- Puree -- Mechanical Soft -- Regular -- Multi-consistency -- Pill -- Cervical Esophageal Comment -- No flowsheet data found. Germain Osgood, M.A. CCC-SLP [phone] Germain Osgood 09/03/2015, 1:33 PM              [place] [facility] [employer] Guide [employer]  09/07/2015  INDICATION: History of duodenal ulcer now with acute upper GI bleed. Please perform mesenteric arteriogram and potential percutaneous coil embolization. EXAM: 1. ULTRASOUND GUIDANCE FOR ARTERIAL ACCESS. 2. SELECTIVE CELIAC AND SUPERIOR MESENTERIC ARTERIOGRAMS 3. SELECTED PROPER HEPATIC ARTERIOGRAM 4. SELECTIVE GASTRODUODENAL ARTERIOGRAM AND PERCUTANEOUS COIL EMBOLIZATION MEDICATIONS: None ANESTHESIA/SEDATION: Fentanyl 75 mcg IV; Versed 1.5 mg IV Moderate Sedation Time: 65 minutes; The patient was continuously monitored during the procedure by the interventional radiology nurse under my direct supervision. CONTRAST:  30m OMNIPAQUE IOHEXOL 300 MG/ML  SOLN FLUOROSCOPY TIME:  Fluoroscopy Time: 18 minutes 24 seconds (1595 mGy). COMPLICATIONS: SIR Level A - No therapy, no consequence. The procedure was complicated by non targeted embolization of a a 2 mm x 4 mm non fiber interlock coil within a cranial subsegmental branch vessel of the splenic artery. PROCEDURE: Informed consent was  obtained from the patient following explanation of the procedure, risks, benefits and alternatives. The patient understands, agrees and consents for the procedure. All questions were addressed. A time out was performed prior to the initiation of the procedure. Maximal barrier sterile technique utilized including caps, mask, sterile gowns, sterile gloves, large sterile drape, hand hygiene, and Betadine prep. The right femoral head was marked fluoroscopically. Under ultrasound guidance, the right common femoral  artery was accessed with a micropuncture kit after the overlying soft tissues were anesthetized with 1% lidocaine. An ultrasound image was saved for documentation purposes. The micropuncture sheath was exchanged for a 5 [place] vascular sheath over a Bentson wire. A closure arteriogram was performed through the side of the sheath confirming access within the right common femoral artery. Over a Bentson wire, a Mickelson catheter was advanced to the level of the thoracic aorta where it was back bled and flushed. The catheter was then utilized to select the celiac artery and a selective celiac arteriogram was performed. With the use of a Fathom micro wire, a regular Renegade micro catheter was utilized to select the proper hepatic artery and a selective proper hepatic arteriogram was performed. The microcatheter was then utilized to select the gastroduodenal artery and a selective gastroduodenal arteriogram was performed. The micro catheter was advanced further into the distal aspect of the gastroduodenal artery. Following sub selective injection, a 2 mm x 4 mm non fibered interlock coil was attempted to be deployed within the distal aspect of the gastroduodenal artery however it became apparent that the micro catheter I was given was NOT a regular sized catheter but rather was a high-flow catheter. As such, coil became entrapped within the distal aspect of the micro catheter. With some manipulation, the coil was  ultimately safely deployed within a cranial subsegmental branch of the splenic artery. The incorrect the microcatheter was discarded and a regular Renegade micro catheter was again utilized to select the gastroduodenal artery. Sub selective injection was performed and the gastroduodenal artery was percutaneously coil embolized with multiple overlapping 2 mm, 3 mm and 4 mm interlock fibered and non-fibered coils to near the vessel's origin. A completion proper hepatic arteriogram was performed. The micro catheter was removed and completion celiac and superior mesenteric arteriograms were performed. Images reviewed and the procedure was terminated. All wires, catheters and sheaths were removed from the patient. Hemostasis was achieved at the right groin access site with deployment of an Exoseal closure device. A dressing was placed. The patient tolerated the procedure well without immediate postprocedural complication. FINDINGS: Celiac arteriogram demonstrates a conventional branching pattern. Superior mesenteric arteriogram failed to delineate any retrograde contribution to the gastroduodenal artery. Selective proper hepatic arteriogram demonstrates a markedly diminutive and irregular appearing gastroduodenal artery which was confirmed with selective gastroduodenal arteriogram. Ultimately successful percutaneous coil embolization of the gastroduodenal artery to near the vessels origin. Note, procedure was complicated by non target embolization of a cranial subsegmental branch vessel of the splenic artery. Completion proper hepatic arteriogram and demonstrates complete occlusion of the GDA. Completion celiac arteriogram demonstrates a hypertrophied gastroepiploic artery arising from the left gastric artery without definitive contribution to the duodenal bulb. IMPRESSION: 1. Technically successful prophylactic percutaneous coil embolization of the gastroduodenal artery for acute upper GI bleed. 2. Procedure complicated  by non target embolization of a cranial subsegmental branch vessel of the splenic artery secondary to mismatch of catheter sizing. This non target coil is felt to be of no clinical significance. Above findings were discussed with Dr. Lala Lund , at the time of procedure completion. Electronically Signed   By: Sandi Mariscal M.D.   On: 09/07/2015 18:09    Signature  Thurnell Lose M.D on 09/11/2015 at 9:49 AM  Between 7am to 7pm - Pager - [phone], After 7pm go to [email or web address] - password [place]  [phone]   LOS: 12 days

## 2015-09-12 ENCOUNTER — Inpatient Hospital Stay (HOSPITAL_COMMUNITY): Payer: Medicaid Other

## 2015-09-12 ENCOUNTER — Telehealth: Payer: Self-pay

## 2015-09-12 LAB — CBC
HCT: 27 % — ABNORMAL LOW (ref 36.0–46.0)
Hemoglobin: 8.5 g/dL — ABNORMAL LOW (ref 12.0–15.0)
MCH: 27.5 pg (ref 26.0–34.0)
MCHC: 31.5 g/dL (ref 30.0–36.0)
MCV: 87.4 fL (ref 78.0–100.0)
PLATELETS: 256 10*3/uL (ref 150–400)
RBC: 3.09 MIL/uL — AB (ref 3.87–5.11)
RDW: 14.3 % (ref 11.5–15.5)
WBC: 13 10*3/uL — AB (ref 4.0–10.5)

## 2015-09-12 LAB — COMPREHENSIVE METABOLIC PANEL
ALBUMIN: 1.9 g/dL — AB (ref 3.5–5.0)
ALK PHOS: 91 U/L (ref 38–126)
ALT: 15 U/L (ref 14–54)
ANION GAP: 9 (ref 5–15)
AST: 15 U/L (ref 15–41)
BILIRUBIN TOTAL: 0.1 mg/dL — AB (ref 0.3–1.2)
BUN: 18 mg/dL (ref 6–20)
CALCIUM: 8.8 mg/dL — AB (ref 8.9–10.3)
CO2: 28 mmol/L (ref 22–32)
Chloride: 101 mmol/L (ref 101–111)
Creatinine, Ser: 0.72 mg/dL (ref 0.44–1.00)
GFR calc Af Amer: >60 mL/min (ref >60)
GLUCOSE: 174 mg/dL — AB (ref 65–99)
POTASSIUM: 4.3 mmol/L (ref 3.5–5.1)
Sodium: 138 mmol/L (ref 135–145)
TOTAL PROTEIN: 5.8 g/dL — AB (ref 6.5–8.1)

## 2015-09-12 LAB — DIFFERENTIAL
BASOS PCT: 0 %
Basophils Absolute: 0 10*3/uL (ref 0.0–0.1)
EOS PCT: 1 %
Eosinophils Absolute: 0.2 10*3/uL (ref 0.0–0.7)
Lymphocytes Relative: 16 %
Lymphs Abs: 2 10*3/uL (ref 0.7–4.0)
MONO ABS: 0.8 10*3/uL (ref 0.1–1.0)
Monocytes Relative: 6 %
NEUTROS PCT: 77 %
Neutro Abs: 10 10*3/uL — ABNORMAL HIGH (ref 1.7–7.7)

## 2015-09-12 LAB — PHOSPHORUS: Phosphorus: 4.1 mg/dL (ref 2.5–4.6)

## 2015-09-12 LAB — GLUCOSE, CAPILLARY
GLUCOSE-CAPILLARY: 99 mg/dL (ref 65–99)
Glucose-Capillary: 131 mg/dL — ABNORMAL HIGH (ref 65–99)
Glucose-Capillary: 150 mg/dL — ABNORMAL HIGH (ref 65–99)
Glucose-Capillary: 165 mg/dL — ABNORMAL HIGH (ref 65–99)
Glucose-Capillary: 167 mg/dL — ABNORMAL HIGH (ref 65–99)

## 2015-09-12 LAB — TRIGLYCERIDES: TRIGLYCERIDES: 133 mg/dL (ref <150)

## 2015-09-12 LAB — MAGNESIUM: MAGNESIUM: 1.7 mg/dL (ref 1.7–2.4)

## 2015-09-12 LAB — PREALBUMIN: PREALBUMIN: 9.6 mg/dL — AB (ref 18–38)

## 2015-09-12 MED ORDER — DEXTROSE 10 % IV SOLN
INTRAVENOUS | Status: DC
  Administered 2015-09-12: 19:00:00 via INTRAVENOUS

## 2015-09-12 MED ORDER — SCOPOLAMINE 1 MG/3DAYS TD PT72: 1.0000 | MEDICATED_PATCH | TRANSDERMAL | Status: DC

## 2015-09-12 MED ORDER — ACETAMINOPHEN 325 MG RE SUPP: 325.0000 mg | RECTAL | Status: AC | PRN

## 2015-09-12 MED ORDER — NONFORMULARY OR COMPOUNDED ITEM: Status: DC

## 2015-09-12 MED ORDER — FREESTYLE SYSTEM KIT: 1.0000 | PACK | Freq: Three times a day (TID) | Status: DC

## 2015-09-12 MED ORDER — PROMETHAZINE HCL 25 MG RE SUPP: 25.0000 mg | Freq: Four times a day (QID) | RECTAL | Status: DC | PRN

## 2015-09-12 MED ORDER — NICOTINE 21 MG/24HR TD PT24: 21.0000 mg | MEDICATED_PATCH | Freq: Every day | TRANSDERMAL | Status: DC

## 2015-09-12 MED ORDER — ALBUTEROL SULFATE (2.5 MG/3ML) 0.083% IN NEBU: 2.5000 mg | INHALATION_SOLUTION | Freq: Four times a day (QID) | RESPIRATORY_TRACT | Status: AC | PRN

## 2015-09-12 MED ORDER — INSULIN ASPART 100 UNIT/ML ~~LOC~~ SOLN: SUBCUTANEOUS | Status: AC

## 2015-09-12 MED ORDER — GLUCOSE BLOOD VI STRP: ORAL_STRIP | Status: AC

## 2015-09-12 NOTE — Progress Notes (Signed)
Spoke with daughter Armatha and daughter April at bedside. Verified that patient will be going home to [address] [place] [address]. Jaquita Folds [phone], April [phone].  Oxygen for home and portable suction at bedside. Patient and daughters instructed to take it with them for transportation. Elvera Maria RN to reinforce this, and make sure patient is sent out with both pieces of equipment. Per Brenton Grills at [facility] O2 tank has 4 hours of use, and CM will call him when family is leaving so he can notify the home team to deliver all equipment to residence. CM asked Kayla RN to be notified of DC so [facility] could be notified. RT and Texanna IV RN teaching family at present.

## 2015-09-12 NOTE — Care Management Note (Signed)
Case Management Note  Patient Details  Name: Ashley Ramsey MRN: [id number] Date of Birth: 22-Nov-1955  Subjective/Objective:                  Date- 09-12-15 Spoke with patient at the bedside.  Introduced self as Tourist information centre manager and explained role in discharge planning and how to be reached.  Verified patient lives in  [place] with daughter [name].  Verified patient anticipates to go home with family at time of discharge.  Patient confirmed needing help with their medication.  Received MATCH letter 09/12/2015. Patient driven by family to MD appointments.  Verified patient has PCP Advani. Patient states they currently receive [place] services through no one.   Admission Comments: New trach   Carles Collet RN BSN CM [phone]   Action/Plan:  Will DC to home today with [place] TPN Expected Discharge Date:                  Expected Discharge Plan:  [place]  In-House Referral:     Discharge planning Services  CM Consult, [facility] Program  Post Acute Care Choice:  Durable Medical Equipment, Home Health Choice offered to:  Patient  DME Arranged:  Trach supplies, Nebulizer machine, Oxygen DME Agency:  [place]:  RN [facility] could not be provided for PT OT SLP, no qualifying diagnosis per [facility].) HH Agency:  [place]  Status of Service:  Completed, signed off  Medicare Important Message Given:    Date Medicare IM Given:    Medicare IM give by:    Date Additional Medicare IM Given:    Additional Medicare Important Message give by:     If discussed at [place] of Stay Meetings, dates discussed:    Additional Comments:  Carles Collet, RN 09/12/2015, 12:32 PM

## 2015-09-12 NOTE — Progress Notes (Signed)
Spoke with bedside RN Kayla. Family has received education for TPN, however has not had trach teaching through [facility] at this time. Family stating that they do feel comfortable receiving airway management education once immediately prior to discharge. CM in agreement, all DC planning completed, patien to discharge in care of daughters tomorrow after trach teaching reviewed and return demonstrated.

## 2015-09-12 NOTE — Progress Notes (Signed)
Pt's daughter present at bedside, and was demonstrated on how to perform  trach suctioning, clean the peri-tracheal are, and change the drain sponge. Pt's daughter demonstrated some understanding. will cont to teach.

## 2015-09-12 NOTE — Discharge Instructions (Signed)
Follow with Primary MD Lorayne Marek, MD in 2-3 days   Get CBC, CMP, 2 view Chest X ray checked  by Primary MD next visit.    Activity: As tolerated with Full fall precautions use walker/cane & assistance as needed   Disposition Home     Diet:  Thing by mouth all medications and nutrition through IV.  For Heart failure patients - Check your Weight same time everyday, if you gain over 2 pounds, or you develop in leg swelling, experience more shortness of breath or chest pain, call your Primary MD immediately. Follow Cardiac Low Salt Diet and 1.5 lit/day fluid restriction.   On your next visit with your primary care physician please Get Medicines reviewed and adjusted.   Please request your Prim.MD to go over all Hospital Tests and Procedure/Radiological results at the follow up, please get all Hospital records sent to your Prim MD by signing hospital release before you go home.   If you experience worsening of your admission symptoms, develop shortness of breath, life threatening emergency, suicidal or homicidal thoughts you must seek medical attention immediately by calling 911 or calling your MD immediately  if symptoms less severe.  You Must read complete instructions/literature along with all the possible adverse reactions/side effects for all the Medicines you take and that have been prescribed to you. Take any new Medicines after you have completely understood and accpet all the possible adverse reactions/side effects.   Do not drive, operating heavy machinery, perform activities at heights, swimming or participation in water activities or provide baby sitting services if your were admitted for syncope or siezures until you have seen by Primary MD or a Neurologist and advised to do so again.  Do not drive when taking Pain medications.    Do not take more than prescribed Pain, Sleep and Anxiety Medications  Special Instructions: If you have smoked or chewed Tobacco  in the last  2 yrs please stop smoking, stop any regular Alcohol  and or any Recreational drug use.  Wear Seat belts while driving.   Please note  You were cared for by a hospitalist during your hospital stay. If you have any questions about your discharge medications or the care you received while you were in the hospital after you are discharged, you can call the unit and asked to speak with the hospitalist on call if the hospitalist that took care of you is not available. Once you are discharged, your primary care physician will handle any further medical issues. Please note that NO REFILLS for any discharge medications will be authorized once you are discharged, as it is imperative that you return to your primary care physician (or establish a relationship with a primary care physician if you do not have one) for your aftercare needs so that they can reassess your need for medications and monitor your lab values.

## 2015-09-12 NOTE — Discharge Summary (Signed)
Ashley Ramsey, is a 60 y.o. female  DOB 1956-01-24  MRN [id number].  Admission date:  08/29/2015  Admitting Physician  [name], MD  Discharge Date:  09/12/2015   Primary MD  Lorayne Marek, MD  Recommendations for primary care physician for things to follow:   Check CBC BMP in a week. Note patient needs to follow up with ENT, oncology, GI as below.  Needs chest CT in 6 months. Follow-up for adrenal nodule.   Admission Diagnosis  Laryngeal cancer ([place]) [C32.9]   Discharge Diagnosis  Laryngeal cancer ([place]) [C32.9]     Active Problems:   Type 2 diabetes mellitus without complication, without long-term current use of insulin (HCC)   Essential hypertension, benign   Neck mass   Renal mass   AKI (acute kidney injury) ([place])   GI bleed   Palliative care encounter   Dysphagia   Laryngeal cancer [facility])      Past Medical History  Diagnosis Date  . Thyroid disease   . Hypertension   . Neck mass hospitalized 08/29/2015  . Type II diabetes mellitus ([place])   . Left kidney mass   . Anemia, chronic disease     Past Surgical History  Procedure Laterality Date  . No past surgeries    . Esophagogastroduodenoscopy N/A 09/04/2015    Procedure: ESOPHAGOGASTRODUODENOSCOPY (EGD);  Surgeon: Manus Gunning, MD;  Location: [place];  Service: Gastroenterology;  Laterality: N/A;  . Direct laryngoscopy N/A 09/02/2015    Procedure: DIRECT LARYNGOSCOPY WITH BIOPSY;  Surgeon: Izora Gala, MD;  Location: [place];  Service: ENT;  Laterality: N/A;  . Esophagoscopy N/A 09/02/2015    Procedure: ESOPHAGOSCOPY;  Surgeon: Izora Gala, MD;  Location: [place];  Service: ENT;  Laterality: N/A;  . Tracheostomy tube placement N/A 09/04/2015    Procedure: TRACHEOSTOMY;  Surgeon: Izora Gala, MD;  Location: [place];  Service:  ENT;  Laterality: N/A;  . Direct laryngoscopy  09/04/2015    Procedure: DIRECT View LARYNGOSCOPY with cautery of of tumor;  Surgeon: Izora Gala, MD;  Location: [place];  Service: ENT;;       HPI :    60 year old female with past medical history of hypertension, type 2 diabetes presented to the ED with worsening hoarseness of voice and abdominal pain. Upon further evaluation with a CT neck she was found to have a laryngeal mask, CT abdomen revealed a left renal mass. She was also found to have UTI with SIRS. She was admitted and started on IV antibiotics, ENT was consulted-patient underwent a biopsy on 1/20. Hospital course has been complicated by development of lower GI bleeding and associated blood loss anemia.   She had an episode of hematemesis on 09/04/2015 after with GI was reconsulted, ENT was called as well, she is post tracheostomy on 09/04/2015.     Hospital Course:     Metastatic Invasive Sq Cell Laryngeal CA : ENT & oncology consulted, underwent laryngoscopy with biopsy on 1/20 confirming the malignancy.   She underwent tracheostomy placement  by ENT on 09/05/2015 to secure her airway. Trach care and speech following along with ENT Dr Constance Holster.  Oncology consulted, I had detailed discussions with oncologist Dr. Alvy Bimler on 09/04/2015. Patient's prognosis is extremely poor, poor candidate for chemotherapy, also most likely has renal cancer. She recommends palliative radiation treatments and consultation with palliative care. Discussed with ENT Dr Constance Holster on 09/08/2015. Outpatient follow-[facility] with [facility] once acute issues have resolved per ENT may be in the next 1-2 weeks. Will also follow with Dr. Constance Holster locally. She will also follow with oncologist Dr. Alvy Bimler locally.  No PEG tube as she has possible stomach and duodenal ulcers. Now has a PICC line for TNA. Speech to to continue to monitor at home For both PMV and continued swallow eval.    Note patient did not qualify for SNF,  home suction device provided, tracheostomy supplies provided, home health RN along with nursing aid PT also ordered, she will be discharged with home TNA. She will need outpatient GI follow-up for eventual PEG tube placement.    Acute blood loss related anemia due to UGI bleed. 5 units of packed RBC transfusion on 09/04/2015, H&H now stable, GI bleed due to likely proximal stomach and duodenal ulcer, status post EGD on 09/04/2015, on 09-07-15 @ 1pm - 1 more dark stool, continue IV PPI continue, GI informed, IR called, her Gastroduodenal Artery was coiled/embolized by IR on 09-07-15, H&H stable, no further transfusion or bleed .   Left renal mass: Highly suspicious for renal cell carcinoma. Doubt it is related to above. Spoke with Dr. Ree Kida MD on 1/17,he reviewed patient's CT scan in chart - recommends outpatient follow-up with him in the office for consideration of nephrectomy in the future. However patient will have a laryngeal mass addressed first with palliative radiation treatments, currently too weak for chemotherapy and nephrectomy.    Dysphagia: Secondary to known laryngeal mass. Speech therapy following-completed MBS-recommendations are for dysphagia 1 diet. Await arrival of family-will discuss with patient/family regarding risks of aspiration. She will most likely may require PEG tube in the future once GI Ulcers are better by GI under direct visualization, IR also suggested that GI place the PEG tube in 1-2 weeks. Follow with Dr Collene Mares.   Left adrenal gland nodule: Will need outpatient follow-up, especially given left adrenal mass.  Subpleural nodules in the right middle lobe of right lung and left lower lobe of left lung: Will need to repeat CT chest in 6 months. Stable on room air, no shortness of breath  E coli pyelonephritis with SIRS: Sensitivities noted, finished Rocephin. Afebrile.  Acute renal failure: Likely prerenal azotemia, resolved after hydration.  Hypertension:  stable off meds.   Tobacco & Alcohol abuse: Counseled, completed Ativan per CIWA protocol-no signs of withdrawal  Possible CAD: Atherosclerosis seen CT chest. Unfortunately with ongoing lower GI bleeding-unable to use antiplatelets. Echo with LFEV 35-70%, grade 1 diastolic dysfunction, no wall motion abnormalities. Given numerous above medical problems-not a candidate for any procedures/further workup at this point.  Morbid obesity: Has lost significant amount of weight over the past few months.  Increased taking secretions. They are watery, added scopolamine patch. Afebrile without leukocytosis. CXR stable.   Type 2 diabetes: Controlled with CBGs 110-130s. Continue SSI.     Discharge Condition: Guarded  Follow UP  Follow-up Information    Follow up with Alexis Frock, MD. Schedule an appointment as soon as possible for a visit in 2 weeks.   Specialty:  Urology   Why:  Hospital follow up for possible kidney cancer. Please call and make appointment.   Contact information:   [address] [place] [address] [phone]       Follow up with Philomena Doheny, MD. Schedule an appointment as soon as possible for a visit in 1 week.   Specialty:  Plastic Surgery   Why:  Laryngeal cancer   Contact information:   [place] [address] [phone]       Follow up with [facility], NI, MD. Schedule an appointment as soon as possible for a visit in 1 week.   Specialty:  Hematology and Oncology   Why:  Laryngeal cancer and renal cancer. NS called to schedule APPT and was transfered to voicemail of "[facility] DANIEL[facility]".NS left patient info and to call the patient back on the number in system [phone]. NS alert RN of barrier on 09/09/15 @ 1327   Contact information:   [address] [place] [address] [phone]       Follow up with [facility].   Why:  Trach supplies and TPN   Contact information:   [address] [place] [address] [phone]       Follow up with [facility].   Why:  RN for home trach care   Contact information:   [address] [place] [address] [phone]       Follow up with Izora Gala, MD. Schedule an appointment as soon as possible for a visit in 1 week.   Specialty:  Otolaryngology   Contact information:   [address] [place] [address] [phone]       Follow up with Juanita Craver, MD. Schedule an appointment as soon as possible for a visit in 1 week.   Specialty:  Gastroenterology   Why:  PEG tube   Contact information:   [address] [place] [address] [phone]        Consults obtained -  ENT, Oncology, Urology, Pall care, IR, GI  Diet and Activity recommendation: See Discharge Instructions below  Discharge Instructions           Discharge Instructions    Discharge instructions    Complete by:  As directed   Follow with Primary MD Lorayne Marek, MD in 2-3 days   Get CBC, CMP, 2 view Chest X ray checked  by Primary MD next visit.    Activity: As tolerated with Full fall precautions use walker/cane & assistance as needed   Disposition Home     Diet:  Thing by mouth all medications and nutrition through IV.  For Heart failure patients - Check your Weight same time everyday, if you gain over 2 pounds, or you develop in leg swelling, experience more shortness of breath or chest pain, call your Primary MD immediately. Follow Cardiac Low Salt Diet and 1.5 lit/day fluid restriction.   On your next visit with your primary care physician please Get Medicines reviewed and adjusted.   Please request your Prim.MD to go over all Hospital Tests and Procedure/Radiological results at the follow up, please get all Hospital records sent to your Prim MD by signing hospital release before you go home.   If you experience worsening of your admission symptoms, develop shortness of  breath, life threatening emergency, suicidal or homicidal thoughts you must seek medical attention immediately by calling 911 or calling your MD immediately  if symptoms less  severe.  You Must read complete instructions/literature along with all the possible adverse reactions/side effects for all the Medicines you take and that have been prescribed to you. Take any new Medicines after you have completely understood and accpet all the possible adverse reactions/side effects.   Do not drive, operating heavy machinery, perform activities at heights, swimming or participation in water activities or provide baby sitting services if your were admitted for syncope or siezures until you have seen by Primary MD or a Neurologist and advised to do so again.  Do not drive when taking Pain medications.    Do not take more than prescribed Pain, Sleep and Anxiety Medications  Special Instructions: If you have smoked or chewed Tobacco  in the last 2 yrs please stop smoking, stop any regular Alcohol  and or any Recreational drug use.  Wear Seat belts while driving.   Please note  You were cared for by a hospitalist during your hospital stay. If you have any questions about your discharge medications or the care you received while you were in the hospital after you are discharged, you can call the unit and asked to speak with the hospitalist on call if the hospitalist that took care of you is not available. Once you are discharged, your primary care physician will handle any further medical issues. Please note that NO REFILLS for any discharge medications will be authorized once you are discharged, as it is imperative that you return to your primary care physician (or establish a relationship with a primary care physician if you do not have one) for your aftercare needs so that they can reassess your need for medications and monitor your lab values.     Increase activity slowly    Complete by:  As directed               Discharge Medications       Medication List    STOP taking these medications        ibuprofen 200 MG tablet  Commonly known as:  ADVIL,MOTRIN     lisinopril-hydrochlorothiazide 10-12.5 MG tablet  Commonly known as:  PRINZIDE,ZESTORETIC     metFORMIN 500 MG 24 hr tablet  Commonly known as:  GLUCOPHAGE XR      TAKE these medications        acetaminophen 325 MG suppository  Commonly known as:  TYLENOL  Place 1 suppository (325 mg total) rectally every 4 (four) hours as needed for mild pain.     albuterol (2.5 MG/3ML) 0.083% nebulizer solution  Commonly known as:  PROVENTIL  Take 3 mLs (2.5 mg total) by nebulization every 6 (six) hours as needed for wheezing or shortness of breath.     glucose blood test strip  Commonly known as:  FREESTYLE LITE  For glucose testing every before meals at bedtime. Diagnosis E 11.65  Can substitute to any accepted brand     glucose monitoring kit monitoring kit  1 each by Does not apply route 3 (three) times daily before meals.     glucose monitoring kit monitoring kit  1 each by Does not apply route 4 (four) times daily - after meals and at bedtime. 1 month Diabetic Testing Supplies for QAC-QHS accuchecks.Any brand OK. Diagnosis E11.65     insulin aspart 100 UNIT/ML injection  Commonly known as:  NOVOLOG  Before each meal 3 times a day, 140-199 - 2 units, 200-250 - 4 units, 251-299 - 6 units,  300-349 - 8 units,  350 or above 10 units. Dispense syringes and needles as needed, Ok to switch to PEN if approved. Substitute to any brand approved. DX DM2, Code E11.65     nicotine 21 mg/24hr patch  Commonly known as:  NICODERM CQ - dosed in mg/24 hours  Place 1 patch (21 mg total) onto the skin daily.     NONFORMULARY OR COMPOUNDED ITEM  TPN with electrolytes and Fat emulsion per [place] via PICC start at 80cc/hr and adjust as needed     promethazine 25 MG suppository  Commonly known as:  PHENERGAN  Place 1 suppository (25 mg  total) rectally every 6 (six) hours as needed for nausea or vomiting.     scopolamine 1 MG/3DAYS  Commonly known as:  TRANSDERM-SCOP  Place 1 patch (1.5 mg total) onto the skin every 3 (three) days.     Vitamin D (Ergocalciferol) 50000 units Caps capsule  Commonly known as:  DRISDOL  Take 1 capsule (50,000 Units total) by mouth every 7 (seven) days.        Major procedures and Radiology Reports - PLEASE review detailed and final reports for all details, in brief -    ECHO- TTE  - Left ventricle: The cavity size was normal. Wall thickness was normal. Systolic function was normal. The estimated ejectionfraction was in the range of 55% to 60%. Wall motion was normal;there were no regional wall motion abnormalities. Dopplerparameters are consistent with abnormal left ventricularrelaxation (grade 1 diastolic dysfunction). Doppler parametersare consistent with high ventricular filling pressure. - Aortic valve: There was very mild stenosis. There was trivialregurgitation. - Mitral valve: Calcified annulus. Mildly thickened leaflets . - Left atrium: The atrium was mildly dilated.  Tracheostomy 09/03/2014 by ENT  PICC 09/05/2015  EGD 09/04/2015 by Dr. Havery Moros. With possible ulcers in the duodenum and proximal stomach. Blood and clots also noted.  Gastroduodenal Artery Embolization - 09-07-15   Dg Chest 2 View  09/12/2015  CLINICAL DATA:  Tracheostomy, cough, shortness of breath EXAM: CHEST  2 VIEW COMPARISON:  CT chest 08/29/2015 FINDINGS: There is a tracheostomy tube in satisfactory position. There is no focal parenchymal opacity. There is no pleural effusion or pneumothorax. The heart and mediastinal contours are unremarkable. The osseous structures are unremarkable. IMPRESSION: No active cardiopulmonary disease. Electronically Signed   By: Kathreen Devoid   On: 09/12/2015 09:07   Dg Chest 2 View  08/29/2015  CLINICAL DATA:  One month history of chest pain and hoarse voice. EXAM:  CHEST  2 VIEW COMPARISON:  None. FINDINGS: The heart is borderline and enlarged. Mild tortuosity of the thoracic aorta. Low lung volumes with mild vascular crowding and streaky basilar atelectasis. There also bronchitic changes which could be acute or chronic. No infiltrates or effusions. The bony thorax is intact. IMPRESSION: Acute versus chronic bronchitic change.  No infiltrates or effusions Electronically Signed   By: Marijo Sanes M.D.   On: 08/29/2015 16:15   Ct Soft Tissue Neck W Contrast  08/29/2015  CLINICAL DATA:  60 year old female with abnormal full waist for 1 month. Abdominal pain. Three days of vomiting. Initial encounter. EXAM: CT NECK WITH CONTRAST TECHNIQUE: Multidetector CT imaging of the neck was performed using the standard protocol following the bolus administration of intravenous contrast. CONTRAST:  41m OMNIPAQUE IOHEXOL 300 MG/ML SOLN in conjunction with contrast enhanced imaging of the chest, abdomen, and pelvis reported separately. COMPARISON:  CT chest abdomen and pelvis from today reported separately FINDINGS: Pharynx and larynx: Bulky supraglottic laryngeal tumor with marked  soft tissue enlargement of the bilateral aryepiglottic folds and anterior commissure up to 17 mm in thickness. Tumor appears inseparable from the undersurface of the strap muscles suggesting extension through the thyroid cartilage bilaterally. Posterior hypopharynx involvement. The epiglottis is thickened, nodular, and distorted. Furthermore, the vallecula is mostly effaced and soft tissue at the base of tongue appears nodular and thickened. All told, tumor encompasses 37 x 40 x 58 mm (AP by transverse by CC). The other laryngeal cartilages appear spared. The palatine tonsils, soft palate, and nasopharynx appear within normal limits. Superior parapharyngeal spaces and retropharyngeal space are within normal limits. Salivary glands: Sublingual space, submandibular glands, and parotid glands are within normal  limits. Thyroid: Coarsely calcified 2.3 cm right thyroid nodule. Subcentimeter left lobe nodule. Lymph nodes: Abnormal right level 3 node measuring 9 mm at the level of the thyroid cartilage (series 1, image 57). Small but asymmetric 5 mm right level IIIa lymph node at the level of the hyoid bone on the right (series 1, image 49). Bilateral level 2A nodes appear symmetric measuring 8-9 mm in thickness. No level 4, 5, or level 1 lymphadenopathy. Vascular: Suboptimal intravascular contrast timing, but major vascular structures in the neck and at the skullbase appear to remain patent. Left greater than right carotid bifurcation calcified atherosclerosis. Limited intracranial: Negative.  Partially empty sella. Visualized orbits: Negative. Mastoids and visualized paranasal sinuses: Visualized paranasal sinuses and mastoids are clear. Skeleton: Mostly absent dentition. Periapical lucency about the residual left mandible molar. No osseous metastatic disease identified. Upper chest: Reported separately today. IMPRESSION: 1. 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 2. Suspect metastatic nodal disease at level 3 on the right (small but asymmetric up to 9 mm nodes). Bilateral level 2 nodes are indeterminate and also measure up to 9 mm individually. No cystic or necrotic nodes in the neck. 3. See CT chest abdomen and pelvis from today reported separately. Electronically Signed   By: Genevie Ann M.D.   On: 08/29/2015 19:56   Ct Chest W Contrast  08/29/2015  CLINICAL DATA:  60 year old female with 2-3 day history of vomiting. Abdominal pain for the past month. Loss of voice. EXAM: CT CHEST, ABDOMEN, AND PELVIS WITH CONTRAST TECHNIQUE: Multidetector CT imaging of the chest, abdomen and pelvis was performed following the standard protocol during bolus administration of intravenous contrast. CONTRAST:  36m OMNIPAQUE IOHEXOL 300 MG/ML  SOLN  COMPARISON:  No priors. FINDINGS: CT CHEST FINDINGS Mediastinum/Lymph Nodes: Heart size is normal. There is no significant pericardial fluid, thickening or pericardial calcification. There is atherosclerosis of the thoracic aorta, the great vessels of the mediastinum and the coronary arteries, including calcified atherosclerotic plaque in the left main, left anterior descending, left circumflex and right coronary arteries. Calcifications of the aortic valve and mitral valve/annulus. No pathologically enlarged mediastinal or hilar lymph nodes. Esophagus is unremarkable in appearance. No axillary lymphadenopathy. Lungs/Pleura: 4 mm subpleural nodule in the lateral segment of the right middle lobe (image 31 of series 4). 4 mm subpleural nodule in the posterior left lower lobe (image 38 of series 4). No other suspicious appearing pulmonary nodules or masses. No acute consolidative airspace disease. No pleural effusions. Musculoskeletal/Soft Tissues: There are no aggressive appearing lytic or blastic lesions noted in the visualized portions of the skeleton. CT ABDOMEN AND PELVIS FINDINGS Hepatobiliary: No cystic or solid hepatic lesions. No intra or extrahepatic biliary ductal dilatation. 7 mm calcified gallstone lying dependently in  the gallbladder. No current findings to suggest an acute cholecystitis at this time. Pancreas: No pancreatic mass. No pancreatic ductal dilatation. No pancreatic or peripancreatic fluid or inflammatory changes. Spleen: Unremarkable. Adrenals/Urinary Tract: In the lower pole of the left kidney there is a 5.9 x 6.1 x 6.3 cm heterogeneously enhancing lesion highly concerning for renal cell carcinoma. The inferior aspect of this lesion comes in very close proximity to the anterior aspect of the left psoas muscle and quadratus lumborum muscle, however, there appears to be an intervening fat plane at this time. The lesion is well separated from the left renal vein. Right kidney and right adrenal  gland are normal in appearance. 1.2 x 1.4 cm indeterminate nodule in the lateral limb of the left adrenal gland. No hydroureteronephrosis. Urinary bladder is largely decompressed, but otherwise unremarkable in appearance. Stomach/Bowel: Normal appearance of the stomach. No pathologic dilatation of small bowel or colon. A few scattered colonic diverticulae are noted, without surrounding inflammatory changes to suggest an acute diverticulitis at this time. Appendix is normal. Vascular/Lymphatic: Atherosclerosis throughout the abdominal and pelvic vasculature, without evidence of aneurysm or dissection. Left renal vein is widely patent and separate from the lower pole mass. No lymphadenopathy noted in the abdomen or pelvis. Reproductive: Uterus and ovaries are unremarkable in appearance. Other: No significant volume of ascites.  No pneumoperitoneum. Musculoskeletal: There are no aggressive appearing lytic or blastic lesions noted in the visualized portions of the skeleton. IMPRESSION: 1. No acute findings in the abdomen or pelvis to account for the patient's symptoms. 2. However, there is a 5.9 x 6.1 x 6.3 cm heterogeneously enhancing mass in the lower pole of the left kidney highly concerning for renal cell carcinoma. At this time, the lesion appears likely to be encapsulated within Gerota's fascia (although it comes very close to the left psoas and quadratus lumborum musculature), does not involve the left renal vein, and does not appear to be associated with lymphadenopathy. Nonemergent Urologic consultation for surgical resection is strongly recommended in the near future. 3. 1.2 x 1.4 cm indeterminate nodule in the left adrenal gland. Attention on followup studies is recommended, as a metastatic lesion is not excluded. 4. 4 mm subpleural nodules in the right middle lobe and left lower lobe. These are highly nonspecific, and favored to represent subpleural lymph nodes, but attention on followup studies is  recommended to ensure stability. 5. Cholelithiasis without evidence of acute cholecystitis at this time. 6. Atherosclerosis, including left main and 3 vessel coronary artery disease. Please note that although the presence of coronary artery calcium documents the presence of coronary artery disease, the severity of this disease and any potential stenosis cannot be assessed on this non-gated CT examination. Assessment for potential risk factor modification, dietary therapy or pharmacologic therapy may be warranted, if clinically indicated. 7. There are calcifications of the aortic valve and mitral valve/annulus. Echocardiographic correlation for evaluation of potential valvular dysfunction may be warranted if clinically indicated. 8. Colonic diverticulosis without evidence of acute diverticulitis at this time. 9. Additional incidental findings, as above. These results were called by telephone at the time of interpretation on 08/29/2015 at 7:34 pm to Dr. Davonna Belling, who verbally acknowledged these results. Electronically Signed   By: Vinnie Langton M.D.   On: 08/29/2015 19:37   Ct Abdomen Pelvis W Contrast  08/29/2015  CLINICAL DATA:  60 year old female with 2-3 day history of vomiting. Abdominal pain for the past month. Loss of voice. EXAM: CT CHEST, ABDOMEN, AND PELVIS WITH CONTRAST  TECHNIQUE: Multidetector CT imaging of the chest, abdomen and pelvis was performed following the standard protocol during bolus administration of intravenous contrast. CONTRAST:  66m OMNIPAQUE IOHEXOL 300 MG/ML  SOLN COMPARISON:  No priors. FINDINGS: CT CHEST FINDINGS Mediastinum/Lymph Nodes: Heart size is normal. There is no significant pericardial fluid, thickening or pericardial calcification. There is atherosclerosis of the thoracic aorta, the great vessels of the mediastinum and the coronary arteries, including calcified atherosclerotic plaque in the left main, left anterior descending, left circumflex and right coronary  arteries. Calcifications of the aortic valve and mitral valve/annulus. No pathologically enlarged mediastinal or hilar lymph nodes. Esophagus is unremarkable in appearance. No axillary lymphadenopathy. Lungs/Pleura: 4 mm subpleural nodule in the lateral segment of the right middle lobe (image 31 of series 4). 4 mm subpleural nodule in the posterior left lower lobe (image 38 of series 4). No other suspicious appearing pulmonary nodules or masses. No acute consolidative airspace disease. No pleural effusions. Musculoskeletal/Soft Tissues: There are no aggressive appearing lytic or blastic lesions noted in the visualized portions of the skeleton. CT ABDOMEN AND PELVIS FINDINGS Hepatobiliary: No cystic or solid hepatic lesions. No intra or extrahepatic biliary ductal dilatation. 7 mm calcified gallstone lying dependently in the gallbladder. No current findings to suggest an acute cholecystitis at this time. Pancreas: No pancreatic mass. No pancreatic ductal dilatation. No pancreatic or peripancreatic fluid or inflammatory changes. Spleen: Unremarkable. Adrenals/Urinary Tract: In the lower pole of the left kidney there is a 5.9 x 6.1 x 6.3 cm heterogeneously enhancing lesion highly concerning for renal cell carcinoma. The inferior aspect of this lesion comes in very close proximity to the anterior aspect of the left psoas muscle and quadratus lumborum muscle, however, there appears to be an intervening fat plane at this time. The lesion is well separated from the left renal vein. Right kidney and right adrenal gland are normal in appearance. 1.2 x 1.4 cm indeterminate nodule in the lateral limb of the left adrenal gland. No hydroureteronephrosis. Urinary bladder is largely decompressed, but otherwise unremarkable in appearance. Stomach/Bowel: Normal appearance of the stomach. No pathologic dilatation of small bowel or colon. A few scattered colonic diverticulae are noted, without surrounding inflammatory changes to  suggest an acute diverticulitis at this time. Appendix is normal. Vascular/Lymphatic: Atherosclerosis throughout the abdominal and pelvic vasculature, without evidence of aneurysm or dissection. Left renal vein is widely patent and separate from the lower pole mass. No lymphadenopathy noted in the abdomen or pelvis. Reproductive: Uterus and ovaries are unremarkable in appearance. Other: No significant volume of ascites.  No pneumoperitoneum. Musculoskeletal: There are no aggressive appearing lytic or blastic lesions noted in the visualized portions of the skeleton. IMPRESSION: 1. No acute findings in the abdomen or pelvis to account for the patient's symptoms. 2. However, there is a 5.9 x 6.1 x 6.3 cm heterogeneously enhancing mass in the lower pole of the left kidney highly concerning for renal cell carcinoma. At this time, the lesion appears likely to be encapsulated within Gerota's fascia (although it comes very close to the left psoas and quadratus lumborum musculature), does not involve the left renal vein, and does not appear to be associated with lymphadenopathy. Nonemergent Urologic consultation for surgical resection is strongly recommended in the near future. 3. 1.2 x 1.4 cm indeterminate nodule in the left adrenal gland. Attention on followup studies is recommended, as a metastatic lesion is not excluded. 4. 4 mm subpleural nodules in the right middle lobe and left lower lobe. These are highly nonspecific,  and favored to represent subpleural lymph nodes, but attention on followup studies is recommended to ensure stability. 5. Cholelithiasis without evidence of acute cholecystitis at this time. 6. Atherosclerosis, including left main and 3 vessel coronary artery disease. Please note that although the presence of coronary artery calcium documents the presence of coronary artery disease, the severity of this disease and any potential stenosis cannot be assessed on this non-gated CT examination. Assessment  for potential risk factor modification, dietary therapy or pharmacologic therapy may be warranted, if clinically indicated. 7. There are calcifications of the aortic valve and mitral valve/annulus. Echocardiographic correlation for evaluation of potential valvular dysfunction may be warranted if clinically indicated. 8. Colonic diverticulosis without evidence of acute diverticulitis at this time. 9. Additional incidental findings, as above. These results were called by telephone at the time of interpretation on 08/29/2015 at 7:34 pm to Dr. Davonna Belling, who verbally acknowledged these results. Electronically Signed   By: Vinnie Langton M.D.   On: 08/29/2015 19:37   Ir Angiogram Visceral Selective  09/07/2015  INDICATION: History of duodenal ulcer now with acute upper GI bleed. Please perform mesenteric arteriogram and potential percutaneous coil embolization. EXAM: 1. ULTRASOUND GUIDANCE FOR ARTERIAL ACCESS. 2. SELECTIVE CELIAC AND SUPERIOR MESENTERIC ARTERIOGRAMS 3. SELECTED PROPER HEPATIC ARTERIOGRAM 4. SELECTIVE GASTRODUODENAL ARTERIOGRAM AND PERCUTANEOUS COIL EMBOLIZATION MEDICATIONS: None ANESTHESIA/SEDATION: Fentanyl 75 mcg IV; Versed 1.5 mg IV Moderate Sedation Time: 65 minutes; The patient was continuously monitored during the procedure by the interventional radiology nurse under my direct supervision. CONTRAST:  64m OMNIPAQUE IOHEXOL 300 MG/ML  SOLN FLUOROSCOPY TIME:  Fluoroscopy Time: 18 minutes 24 seconds (1595 mGy). COMPLICATIONS: SIR Level A - No therapy, no consequence. The procedure was complicated by non targeted embolization of a a 2 mm x 4 mm non fiber interlock coil within a cranial subsegmental branch vessel of the splenic artery. PROCEDURE: Informed consent was obtained from the patient following explanation of the procedure, risks, benefits and alternatives. The patient understands, agrees and consents for the procedure. All questions were addressed. A time out was performed prior to  the initiation of the procedure. Maximal barrier sterile technique utilized including caps, mask, sterile gowns, sterile gloves, large sterile drape, hand hygiene, and Betadine prep. The right femoral head was marked fluoroscopically. Under ultrasound guidance, the right common femoral artery was accessed with a micropuncture kit after the overlying soft tissues were anesthetized with 1% lidocaine. An ultrasound image was saved for documentation purposes. The micropuncture sheath was exchanged for a 5 [place] sheath over a Bentson wire. A closure arteriogram was performed through the side of the sheath confirming access within the right common femoral artery. Over a Bentson wire, a Mickelson catheter was advanced to the level of the thoracic aorta where it was back bled and flushed. The catheter was then utilized to select the celiac artery and a selective celiac arteriogram was performed. With the use of a Fathom micro wire, a regular Renegade micro catheter was utilized to select the proper hepatic artery and a selective proper hepatic arteriogram was performed. The microcatheter was then utilized to select the gastroduodenal artery and a selective gastroduodenal arteriogram was performed. The micro catheter was advanced further into the distal aspect of the gastroduodenal artery. Following sub selective injection, a 2 mm x 4 mm non fibered interlock coil was attempted to be deployed within the distal aspect of the gastroduodenal artery however it became apparent that the micro catheter I was given was NOT a regular sized catheter but  rather was a high-flow catheter. As such, coil became entrapped within the distal aspect of the micro catheter. With some manipulation, the coil was ultimately safely deployed within a cranial subsegmental branch of the splenic artery. The incorrect the microcatheter was discarded and a regular Renegade micro catheter was again utilized to select the gastroduodenal artery.  Sub selective injection was performed and the gastroduodenal artery was percutaneously coil embolized with multiple overlapping 2 mm, 3 mm and 4 mm interlock fibered and non-fibered coils to near the vessel's origin. A completion proper hepatic arteriogram was performed. The micro catheter was removed and completion celiac and superior mesenteric arteriograms were performed. Images reviewed and the procedure was terminated. All wires, catheters and sheaths were removed from the patient. Hemostasis was achieved at the right groin access site with deployment of an Exoseal closure device. A dressing was placed. The patient tolerated the procedure well without immediate postprocedural complication. FINDINGS: Celiac arteriogram demonstrates a conventional branching pattern. Superior mesenteric arteriogram failed to delineate any retrograde contribution to the gastroduodenal artery. Selective proper hepatic arteriogram demonstrates a markedly diminutive and irregular appearing gastroduodenal artery which was confirmed with selective gastroduodenal arteriogram. Ultimately successful percutaneous coil embolization of the gastroduodenal artery to near the vessels origin. Note, procedure was complicated by non target embolization of a cranial subsegmental branch vessel of the splenic artery. Completion proper hepatic arteriogram and demonstrates complete occlusion of the GDA. Completion celiac arteriogram demonstrates a hypertrophied gastroepiploic artery arising from the left gastric artery without definitive contribution to the duodenal bulb. IMPRESSION: 1. Technically successful prophylactic percutaneous coil embolization of the gastroduodenal artery for acute upper GI bleed. 2. Procedure complicated by non target embolization of a cranial subsegmental branch vessel of the splenic artery secondary to mismatch of catheter sizing. This non target coil is felt to be of no clinical significance. Above findings were discussed  with Dr. Lala Lund , at the time of procedure completion. Electronically Signed   By: Sandi Mariscal M.D.   On: 09/07/2015 18:09   Ir Angiogram Visceral Selective  09/07/2015  INDICATION: History of duodenal ulcer now with acute upper GI bleed. Please perform mesenteric arteriogram and potential percutaneous coil embolization. EXAM: 1. ULTRASOUND GUIDANCE FOR ARTERIAL ACCESS. 2. SELECTIVE CELIAC AND SUPERIOR MESENTERIC ARTERIOGRAMS 3. SELECTED PROPER HEPATIC ARTERIOGRAM 4. SELECTIVE GASTRODUODENAL ARTERIOGRAM AND PERCUTANEOUS COIL EMBOLIZATION MEDICATIONS: None ANESTHESIA/SEDATION: Fentanyl 75 mcg IV; Versed 1.5 mg IV Moderate Sedation Time: 65 minutes; The patient was continuously monitored during the procedure by the interventional radiology nurse under my direct supervision. CONTRAST:  104m OMNIPAQUE IOHEXOL 300 MG/ML  SOLN FLUOROSCOPY TIME:  Fluoroscopy Time: 18 minutes 24 seconds (1595 mGy). COMPLICATIONS: SIR Level A - No therapy, no consequence. The procedure was complicated by non targeted embolization of a a 2 mm x 4 mm non fiber interlock coil within a cranial subsegmental branch vessel of the splenic artery. PROCEDURE: Informed consent was obtained from the patient following explanation of the procedure, risks, benefits and alternatives. The patient understands, agrees and consents for the procedure. All questions were addressed. A time out was performed prior to the initiation of the procedure. Maximal barrier sterile technique utilized including caps, mask, sterile gowns, sterile gloves, large sterile drape, hand hygiene, and Betadine prep. The right femoral head was marked fluoroscopically. Under ultrasound guidance, the right common femoral artery was accessed with a micropuncture kit after the overlying soft tissues were anesthetized with 1% lidocaine. An ultrasound image was saved for documentation purposes. The micropuncture sheath was exchanged for  a 5 [place] vascular sheath over a Bentson  wire. A closure arteriogram was performed through the side of the sheath confirming access within the right common femoral artery. Over a Bentson wire, a Mickelson catheter was advanced to the level of the thoracic aorta where it was back bled and flushed. The catheter was then utilized to select the celiac artery and a selective celiac arteriogram was performed. With the use of a Fathom micro wire, a regular Renegade micro catheter was utilized to select the proper hepatic artery and a selective proper hepatic arteriogram was performed. The microcatheter was then utilized to select the gastroduodenal artery and a selective gastroduodenal arteriogram was performed. The micro catheter was advanced further into the distal aspect of the gastroduodenal artery. Following sub selective injection, a 2 mm x 4 mm non fibered interlock coil was attempted to be deployed within the distal aspect of the gastroduodenal artery however it became apparent that the micro catheter I was given was NOT a regular sized catheter but rather was a high-flow catheter. As such, coil became entrapped within the distal aspect of the micro catheter. With some manipulation, the coil was ultimately safely deployed within a cranial subsegmental branch of the splenic artery. The incorrect the microcatheter was discarded and a regular Renegade micro catheter was again utilized to select the gastroduodenal artery. Sub selective injection was performed and the gastroduodenal artery was percutaneously coil embolized with multiple overlapping 2 mm, 3 mm and 4 mm interlock fibered and non-fibered coils to near the vessel's origin. A completion proper hepatic arteriogram was performed. The micro catheter was removed and completion celiac and superior mesenteric arteriograms were performed. Images reviewed and the procedure was terminated. All wires, catheters and sheaths were removed from the patient. Hemostasis was achieved at the right groin access site  with deployment of an Exoseal closure device. A dressing was placed. The patient tolerated the procedure well without immediate postprocedural complication. FINDINGS: Celiac arteriogram demonstrates a conventional branching pattern. Superior mesenteric arteriogram failed to delineate any retrograde contribution to the gastroduodenal artery. Selective proper hepatic arteriogram demonstrates a markedly diminutive and irregular appearing gastroduodenal artery which was confirmed with selective gastroduodenal arteriogram. Ultimately successful percutaneous coil embolization of the gastroduodenal artery to near the vessels origin. Note, procedure was complicated by non target embolization of a cranial subsegmental branch vessel of the splenic artery. Completion proper hepatic arteriogram and demonstrates complete occlusion of the GDA. Completion celiac arteriogram demonstrates a hypertrophied gastroepiploic artery arising from the left gastric artery without definitive contribution to the duodenal bulb. IMPRESSION: 1. Technically successful prophylactic percutaneous coil embolization of the gastroduodenal artery for acute upper GI bleed. 2. Procedure complicated by non target embolization of a cranial subsegmental branch vessel of the splenic artery secondary to mismatch of catheter sizing. This non target coil is felt to be of no clinical significance. Above findings were discussed with Dr. Lala Lund , at the time of procedure completion. Electronically Signed   By: Sandi Mariscal M.D.   On: 09/07/2015 18:09   Ir Angiogram Selective Each Additional Vessel  09/07/2015  INDICATION: History of duodenal ulcer now with acute upper GI bleed. Please perform mesenteric arteriogram and potential percutaneous coil embolization. EXAM: 1. ULTRASOUND GUIDANCE FOR ARTERIAL ACCESS. 2. SELECTIVE CELIAC AND SUPERIOR MESENTERIC ARTERIOGRAMS 3. SELECTED PROPER HEPATIC ARTERIOGRAM 4. SELECTIVE GASTRODUODENAL ARTERIOGRAM AND  PERCUTANEOUS COIL EMBOLIZATION MEDICATIONS: None ANESTHESIA/SEDATION: Fentanyl 75 mcg IV; Versed 1.5 mg IV Moderate Sedation Time: 65 minutes; The patient was continuously monitored during the  procedure by the interventional radiology nurse under my direct supervision. CONTRAST:  52m OMNIPAQUE IOHEXOL 300 MG/ML  SOLN FLUOROSCOPY TIME:  Fluoroscopy Time: 18 minutes 24 seconds (1595 mGy). COMPLICATIONS: SIR Level A - No therapy, no consequence. The procedure was complicated by non targeted embolization of a a 2 mm x 4 mm non fiber interlock coil within a cranial subsegmental branch vessel of the splenic artery. PROCEDURE: Informed consent was obtained from the patient following explanation of the procedure, risks, benefits and alternatives. The patient understands, agrees and consents for the procedure. All questions were addressed. A time out was performed prior to the initiation of the procedure. Maximal barrier sterile technique utilized including caps, mask, sterile gowns, sterile gloves, large sterile drape, hand hygiene, and Betadine prep. The right femoral head was marked fluoroscopically. Under ultrasound guidance, the right common femoral artery was accessed with a micropuncture kit after the overlying soft tissues were anesthetized with 1% lidocaine. An ultrasound image was saved for documentation purposes. The micropuncture sheath was exchanged for a 5 [place] sheath over a Bentson wire. A closure arteriogram was performed through the side of the sheath confirming access within the right common femoral artery. Over a Bentson wire, a Mickelson catheter was advanced to the level of the thoracic aorta where it was back bled and flushed. The catheter was then utilized to select the celiac artery and a selective celiac arteriogram was performed. With the use of a Fathom micro wire, a regular Renegade micro catheter was utilized to select the proper hepatic artery and a selective proper hepatic  arteriogram was performed. The microcatheter was then utilized to select the gastroduodenal artery and a selective gastroduodenal arteriogram was performed. The micro catheter was advanced further into the distal aspect of the gastroduodenal artery. Following sub selective injection, a 2 mm x 4 mm non fibered interlock coil was attempted to be deployed within the distal aspect of the gastroduodenal artery however it became apparent that the micro catheter I was given was NOT a regular sized catheter but rather was a high-flow catheter. As such, coil became entrapped within the distal aspect of the micro catheter. With some manipulation, the coil was ultimately safely deployed within a cranial subsegmental branch of the splenic artery. The incorrect the microcatheter was discarded and a regular Renegade micro catheter was again utilized to select the gastroduodenal artery. Sub selective injection was performed and the gastroduodenal artery was percutaneously coil embolized with multiple overlapping 2 mm, 3 mm and 4 mm interlock fibered and non-fibered coils to near the vessel's origin. A completion proper hepatic arteriogram was performed. The micro catheter was removed and completion celiac and superior mesenteric arteriograms were performed. Images reviewed and the procedure was terminated. All wires, catheters and sheaths were removed from the patient. Hemostasis was achieved at the right groin access site with deployment of an Exoseal closure device. A dressing was placed. The patient tolerated the procedure well without immediate postprocedural complication. FINDINGS: Celiac arteriogram demonstrates a conventional branching pattern. Superior mesenteric arteriogram failed to delineate any retrograde contribution to the gastroduodenal artery. Selective proper hepatic arteriogram demonstrates a markedly diminutive and irregular appearing gastroduodenal artery which was confirmed with selective gastroduodenal  arteriogram. Ultimately successful percutaneous coil embolization of the gastroduodenal artery to near the vessels origin. Note, procedure was complicated by non target embolization of a cranial subsegmental branch vessel of the splenic artery. Completion proper hepatic arteriogram and demonstrates complete occlusion of the GDA. Completion celiac arteriogram demonstrates a hypertrophied gastroepiploic artery  arising from the left gastric artery without definitive contribution to the duodenal bulb. IMPRESSION: 1. Technically successful prophylactic percutaneous coil embolization of the gastroduodenal artery for acute upper GI bleed. 2. Procedure complicated by non target embolization of a cranial subsegmental branch vessel of the splenic artery secondary to mismatch of catheter sizing. This non target coil is felt to be of no clinical significance. Above findings were discussed with Dr. Lala Lund , at the time of procedure completion. Electronically Signed   By: Sandi Mariscal M.D.   On: 09/07/2015 18:09   Ir Angiogram Follow Up Study  09/07/2015  INDICATION: History of duodenal ulcer now with acute upper GI bleed. Please perform mesenteric arteriogram and potential percutaneous coil embolization. EXAM: 1. ULTRASOUND GUIDANCE FOR ARTERIAL ACCESS. 2. SELECTIVE CELIAC AND SUPERIOR MESENTERIC ARTERIOGRAMS 3. SELECTED PROPER HEPATIC ARTERIOGRAM 4. SELECTIVE GASTRODUODENAL ARTERIOGRAM AND PERCUTANEOUS COIL EMBOLIZATION MEDICATIONS: None ANESTHESIA/SEDATION: Fentanyl 75 mcg IV; Versed 1.5 mg IV Moderate Sedation Time: 65 minutes; The patient was continuously monitored during the procedure by the interventional radiology nurse under my direct supervision. CONTRAST:  64m OMNIPAQUE IOHEXOL 300 MG/ML  SOLN FLUOROSCOPY TIME:  Fluoroscopy Time: 18 minutes 24 seconds (1595 mGy). COMPLICATIONS: SIR Level A - No therapy, no consequence. The procedure was complicated by non targeted embolization of a a 2 mm x 4 mm non fiber  interlock coil within a cranial subsegmental branch vessel of the splenic artery. PROCEDURE: Informed consent was obtained from the patient following explanation of the procedure, risks, benefits and alternatives. The patient understands, agrees and consents for the procedure. All questions were addressed. A time out was performed prior to the initiation of the procedure. Maximal barrier sterile technique utilized including caps, mask, sterile gowns, sterile gloves, large sterile drape, hand hygiene, and Betadine prep. The right femoral head was marked fluoroscopically. Under ultrasound guidance, the right common femoral artery was accessed with a micropuncture kit after the overlying soft tissues were anesthetized with 1% lidocaine. An ultrasound image was saved for documentation purposes. The micropuncture sheath was exchanged for a 5 [place] sheath over a Bentson wire. A closure arteriogram was performed through the side of the sheath confirming access within the right common femoral artery. Over a Bentson wire, a Mickelson catheter was advanced to the level of the thoracic aorta where it was back bled and flushed. The catheter was then utilized to select the celiac artery and a selective celiac arteriogram was performed. With the use of a Fathom micro wire, a regular Renegade micro catheter was utilized to select the proper hepatic artery and a selective proper hepatic arteriogram was performed. The microcatheter was then utilized to select the gastroduodenal artery and a selective gastroduodenal arteriogram was performed. The micro catheter was advanced further into the distal aspect of the gastroduodenal artery. Following sub selective injection, a 2 mm x 4 mm non fibered interlock coil was attempted to be deployed within the distal aspect of the gastroduodenal artery however it became apparent that the micro catheter I was given was NOT a regular sized catheter but rather was a high-flow catheter. As  such, coil became entrapped within the distal aspect of the micro catheter. With some manipulation, the coil was ultimately safely deployed within a cranial subsegmental branch of the splenic artery. The incorrect the microcatheter was discarded and a regular Renegade micro catheter was again utilized to select the gastroduodenal artery. Sub selective injection was performed and the gastroduodenal artery was percutaneously coil embolized with multiple overlapping 2 mm, 3  mm and 4 mm interlock fibered and non-fibered coils to near the vessel's origin. A completion proper hepatic arteriogram was performed. The micro catheter was removed and completion celiac and superior mesenteric arteriograms were performed. Images reviewed and the procedure was terminated. All wires, catheters and sheaths were removed from the patient. Hemostasis was achieved at the right groin access site with deployment of an Exoseal closure device. A dressing was placed. The patient tolerated the procedure well without immediate postprocedural complication. FINDINGS: Celiac arteriogram demonstrates a conventional branching pattern. Superior mesenteric arteriogram failed to delineate any retrograde contribution to the gastroduodenal artery. Selective proper hepatic arteriogram demonstrates a markedly diminutive and irregular appearing gastroduodenal artery which was confirmed with selective gastroduodenal arteriogram. Ultimately successful percutaneous coil embolization of the gastroduodenal artery to near the vessels origin. Note, procedure was complicated by non target embolization of a cranial subsegmental branch vessel of the splenic artery. Completion proper hepatic arteriogram and demonstrates complete occlusion of the GDA. Completion celiac arteriogram demonstrates a hypertrophied gastroepiploic artery arising from the left gastric artery without definitive contribution to the duodenal bulb. IMPRESSION: 1. Technically successful  prophylactic percutaneous coil embolization of the gastroduodenal artery for acute upper GI bleed. 2. Procedure complicated by non target embolization of a cranial subsegmental branch vessel of the splenic artery secondary to mismatch of catheter sizing. This non target coil is felt to be of no clinical significance. Above findings were discussed with Dr. Lala Lund , at the time of procedure completion. Electronically Signed   By: Sandi Mariscal M.D.   On: 09/07/2015 18:09   Ir US Guide Vasc Access Right  09/07/2015  INDICATION: History of duodenal ulcer now with acute upper GI bleed. Please perform mesenteric arteriogram and potential percutaneous coil embolization. EXAM: 1. ULTRASOUND GUIDANCE FOR ARTERIAL ACCESS. 2. SELECTIVE CELIAC AND SUPERIOR MESENTERIC ARTERIOGRAMS 3. SELECTED PROPER HEPATIC ARTERIOGRAM 4. SELECTIVE GASTRODUODENAL ARTERIOGRAM AND PERCUTANEOUS COIL EMBOLIZATION MEDICATIONS: None ANESTHESIA/SEDATION: Fentanyl 75 mcg IV; Versed 1.5 mg IV Moderate Sedation Time: 65 minutes; The patient was continuously monitored during the procedure by the interventional radiology nurse under my direct supervision. CONTRAST:  2m OMNIPAQUE IOHEXOL 300 MG/ML  SOLN FLUOROSCOPY TIME:  Fluoroscopy Time: 18 minutes 24 seconds (1595 mGy). COMPLICATIONS: SIR Level A - No therapy, no consequence. The procedure was complicated by non targeted embolization of a a 2 mm x 4 mm non fiber interlock coil within a cranial subsegmental branch vessel of the splenic artery. PROCEDURE: Informed consent was obtained from the patient following explanation of the procedure, risks, benefits and alternatives. The patient understands, agrees and consents for the procedure. All questions were addressed. A time out was performed prior to the initiation of the procedure. Maximal barrier sterile technique utilized including caps, mask, sterile gowns, sterile gloves, large sterile drape, hand hygiene, and Betadine prep. The right femoral  head was marked fluoroscopically. Under ultrasound guidance, the right common femoral artery was accessed with a micropuncture kit after the overlying soft tissues were anesthetized with 1% lidocaine. An ultrasound image was saved for documentation purposes. The micropuncture sheath was exchanged for a 5 [place] sheath over a Bentson wire. A closure arteriogram was performed through the side of the sheath confirming access within the right common femoral artery. Over a Bentson wire, a Mickelson catheter was advanced to the level of the thoracic aorta where it was back bled and flushed. The catheter was then utilized to select the celiac artery and a selective celiac arteriogram was performed. With the use of a  Fathom micro wire, a regular Renegade micro catheter was utilized to select the proper hepatic artery and a selective proper hepatic arteriogram was performed. The microcatheter was then utilized to select the gastroduodenal artery and a selective gastroduodenal arteriogram was performed. The micro catheter was advanced further into the distal aspect of the gastroduodenal artery. Following sub selective injection, a 2 mm x 4 mm non fibered interlock coil was attempted to be deployed within the distal aspect of the gastroduodenal artery however it became apparent that the micro catheter I was given was NOT a regular sized catheter but rather was a high-flow catheter. As such, coil became entrapped within the distal aspect of the micro catheter. With some manipulation, the coil was ultimately safely deployed within a cranial subsegmental branch of the splenic artery. The incorrect the microcatheter was discarded and a regular Renegade micro catheter was again utilized to select the gastroduodenal artery. Sub selective injection was performed and the gastroduodenal artery was percutaneously coil embolized with multiple overlapping 2 mm, 3 mm and 4 mm interlock fibered and non-fibered coils to near the  vessel's origin. A completion proper hepatic arteriogram was performed. The micro catheter was removed and completion celiac and superior mesenteric arteriograms were performed. Images reviewed and the procedure was terminated. All wires, catheters and sheaths were removed from the patient. Hemostasis was achieved at the right groin access site with deployment of an Exoseal closure device. A dressing was placed. The patient tolerated the procedure well without immediate postprocedural complication. FINDINGS: Celiac arteriogram demonstrates a conventional branching pattern. Superior mesenteric arteriogram failed to delineate any retrograde contribution to the gastroduodenal artery. Selective proper hepatic arteriogram demonstrates a markedly diminutive and irregular appearing gastroduodenal artery which was confirmed with selective gastroduodenal arteriogram. Ultimately successful percutaneous coil embolization of the gastroduodenal artery to near the vessels origin. Note, procedure was complicated by non target embolization of a cranial subsegmental branch vessel of the splenic artery. Completion proper hepatic arteriogram and demonstrates complete occlusion of the GDA. Completion celiac arteriogram demonstrates a hypertrophied gastroepiploic artery arising from the left gastric artery without definitive contribution to the duodenal bulb. IMPRESSION: 1. Technically successful prophylactic percutaneous coil embolization of the gastroduodenal artery for acute upper GI bleed. 2. Procedure complicated by non target embolization of a cranial subsegmental branch vessel of the splenic artery secondary to mismatch of catheter sizing. This non target coil is felt to be of no clinical significance. Above findings were discussed with Dr. Lala Lund , at the time of procedure completion. Electronically Signed   By: Sandi Mariscal M.D.   On: 09/07/2015 18:09   Dg Chest Port 1 View  09/04/2015  CLINICAL DATA:  Central line  placement. EXAM: PORTABLE CHEST 1 VIEW COMPARISON:  08/29/2015 FINDINGS: Endotracheal tube terminates 6 cm above the carina. Right internal jugular approach central venous catheter seen with tip at the expected location of superior vena cava. Cardiomediastinal silhouette is enlarged, likely exaggerated by portable technique and rotation. Mediastinal contours appear intact. There is no evidence of focal airspace consolidation, pleural effusion or pneumothorax. Lung volumes are low. Osseous structures are without acute abnormality. Soft tissues are grossly normal. IMPRESSION: Low lung volumes. Endotracheal tube 6 cm above the carina. Advancement with 2 cm may be considered. Right-sided central venous catheter in satisfactory position radiographically. Electronically Signed   By: Fidela Salisbury M.D.   On: 09/04/2015 16:06   Dg Swallowing Func-speech Pathology  09/08/2015  Objective Swallowing Evaluation: Type of Study: MBS-Modified Barium Swallow Study Patient Details Name:  SERENAH MILL MRN: [id number] Date of Birth: Mar 17, 1956 Today's Date: 09/08/2015 Time: SLP Start Time (ACUTE ONLY): 1032-SLP Stop Time (ACUTE ONLY): 1058 SLP Time Calculation (min) (ACUTE ONLY): 26 min Past Medical History: Past Medical History Diagnosis Date . Thyroid disease  . Hypertension  . Neck mass hospitalized 08/29/2015 . Type II diabetes mellitus ([place])  . Left kidney mass  . Anemia, chronic disease  Past Surgical History: Past Surgical History Procedure Laterality Date . No past surgeries   . Esophagogastroduodenoscopy N/A 09/04/2015   Procedure: ESOPHAGOGASTRODUODENOSCOPY (EGD);  Surgeon: Manus Gunning, MD;  Location: [facility];  Service: Gastroenterology;  Laterality: N/A; . Direct laryngoscopy N/A 09/02/2015   Procedure: DIRECT LARYNGOSCOPY WITH BIOPSY;  Surgeon: Izora Gala, MD;  Location: [facility];  Service: ENT;  Laterality: N/A; . Esophagoscopy N/A 09/02/2015   Procedure: ESOPHAGOSCOPY;  Surgeon: Izora Gala, MD;   Location: [facility];  Service: ENT;  Laterality: N/A; . Tracheostomy tube placement N/A 09/04/2015   Procedure: TRACHEOSTOMY;  Surgeon: Izora Gala, MD;  Location: [facility];  Service: ENT;  Laterality: N/A; . Direct laryngoscopy  09/04/2015   Procedure: DIRECT View LARYNGOSCOPY with cautery of of tumor;  Surgeon: Izora Gala, MD;  Location: [facility];  Service: ENT;; HPI: Pt is 60 y.o. female with h/o hypertension and NIDDM. Pt presented to ED with voice changes, difficult swallowing and abdominal discomfort that have progressed over the last month. 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 MBS 1/21 recommended Dys 1, pudding liquids. GI bleed, made NPO, swallow assessment re-ordered. SLP recommended repeat MBS  Subjective: pt alert, eager for food/drink Assessment / Plan / Recommendation CHL IP CLINICAL IMPRESSIONS 09/08/2015 Therapy Diagnosis Moderate pharyngeal phase dysphagia;Severe pharyngeal phase dysphagia Clinical Impression Pt's swallow function decreased from initial MBS 1/21. Swallow function assessed without speaking valve due to poor tolerance during yesterday's treatment session. She exhibited moderate-severe anatomically based pharyngeal dysphagia resulting in sensory and motor deficits. Delayed initiation of swallow and poor base of tongue retraction lead to moderate vallecular residue that was observed throughout the session. Aspiration was noted of both puree and honey thick liquids and pt did not sense aspirated bolus. Moderate verbal and visual cues to cough and swallow multiple times ineffective although finger occlusion of trach increased pressure which helped to significantly remove penetrates. 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 Recommend NPO except for free water protocol under strict guidelines.   Impact on safety and function Severe aspiration risk;Risk for inadequate nutrition/hydration   CHL  IP TREATMENT RECOMMENDATION 09/08/2015 Treatment Recommendations Therapy as outlined in treatment plan below   Prognosis 09/08/2015 Prognosis for Safe Diet Advancement Guarded Barriers to Reach Goals Severity of deficits Barriers/Prognosis Comment -- CHL IP DIET RECOMMENDATION 09/08/2015 SLP Diet Recommendations NPO Liquid Administration via -- Medication Administration Via alternative means Compensations -- Postural Changes --   CHL IP OTHER RECOMMENDATIONS 09/08/2015 Recommended Consults -- Oral Care Recommendations Oral care QID Other Recommendations --   CHL IP FOLLOW UP RECOMMENDATIONS 09/08/2015 Follow up Recommendations 24 hour supervision/assistance   CHL IP FREQUENCY AND DURATION 09/08/2015 Speech Therapy Frequency (ACUTE ONLY) min 2x/week Treatment Duration 2 weeks      CHL IP ORAL PHASE 09/08/2015 Oral Phase WFL Oral - Pudding Teaspoon -- Oral - Pudding Cup -- Oral - Honey Teaspoon -- Oral - Honey Cup -- Oral - Nectar Teaspoon -- Oral - Nectar Cup -- Oral - Nectar Straw -- Oral -  Thin Teaspoon -- Oral - Thin Cup -- Oral - Thin Straw -- Oral - Puree -- Oral - Mech Soft -- Oral - Regular -- Oral - Multi-Consistency -- Oral - Pill -- Oral Phase - Comment --  CHL IP PHARYNGEAL PHASE 09/08/2015 Pharyngeal Phase Impaired Pharyngeal- Pudding Teaspoon NT Pharyngeal Material enters airway, CONTACTS cords and not ejected out;Material enters airway, passes BELOW cords without attempt by patient to eject out (silent aspiration) Pharyngeal- Pudding Cup -- Pharyngeal -- Pharyngeal- Honey Teaspoon Delayed swallow initiation-vallecula;Reduced anterior laryngeal mobility;Reduced airway/laryngeal closure;Reduced laryngeal elevation;Reduced tongue base retraction;Penetration/Apiration after swallow;Pharyngeal residue - valleculae Pharyngeal Material enters airway, CONTACTS cords and not ejected out Pharyngeal- Honey Cup Delayed swallow initiation-vallecula;Reduced anterior laryngeal mobility;Reduced laryngeal elevation;Reduced  airway/laryngeal closure;Reduced tongue base retraction;Penetration/Apiration after swallow;Moderate aspiration;Pharyngeal residue - valleculae;Compensatory strategies attempted (with notebox) Pharyngeal Material enters airway, passes BELOW cords without attempt by patient to eject out (silent aspiration) Pharyngeal- Nectar Teaspoon NT Pharyngeal -- Pharyngeal- Nectar Cup -- Pharyngeal -- Pharyngeal- Nectar Straw -- Pharyngeal -- Pharyngeal- Thin Teaspoon NT Pharyngeal -- Pharyngeal- Thin Cup -- Pharyngeal -- Pharyngeal- Thin Straw NT Pharyngeal -- Pharyngeal- Puree Delayed swallow initiation-vallecula;Pharyngeal residue - valleculae;Penetration/Apiration after swallow;Reduced anterior laryngeal mobility;Reduced airway/laryngeal closure;Reduced tongue base retraction Pharyngeal Material enters airway, passes BELOW cords without attempt by patient to eject out (silent aspiration) Pharyngeal- Mechanical Soft -- Pharyngeal -- Pharyngeal- Regular -- Pharyngeal -- Pharyngeal- Multi-consistency -- Pharyngeal -- Pharyngeal- Pill -- Pharyngeal -- Pharyngeal Comment --  CHL IP CERVICAL ESOPHAGEAL PHASE 09/08/2015 Cervical Esophageal Phase WFL Pudding Teaspoon -- Pudding Cup -- Honey Teaspoon -- Honey Cup -- Nectar Teaspoon -- Nectar Cup -- Nectar Straw -- Thin Teaspoon -- Thin Cup -- Thin Straw -- Puree -- Mechanical Soft -- Regular -- Multi-consistency -- Pill -- Cervical Esophageal Comment -- No flowsheet data found. [place] Siren 09/08/2015, 2:11 PM Orbie Pyo Colvin Caroli.Ed CCC-SLP Pager [phone]              Dg Swallowing Func-speech Pathology  09/03/2015  Objective Swallowing Evaluation:   Patient Details Name: BRITTA LOUTH MRN: [id number] Date of Birth: [birth year] Today's Date: 09/03/2015 Time: SLP Start Time (ACUTE ONLY): 1124-SLP Stop Time (ACUTE ONLY): 1143 SLP Time Calculation (min) (ACUTE ONLY): 19 min Past Medical History: Past Medical History Diagnosis Date . Thyroid disease  . Hypertension  . Neck mass  hospitalized 08/29/2015 . Type II diabetes mellitus ([place])  . Left kidney mass  . Anemia, chronic disease  Past Surgical History: Past Surgical History Procedure Laterality Date . No past surgeries   HPI: Pt is 60 y.o. female with h/o hypertension and NIDDM. Pt presented to ED with voice changes, difficult swallowing and abdominal discomfort that have progressed over the last month. 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 Subjective: pt alert, eager for food/drink Assessment / Plan / Recommendation CHL IP CLINICAL IMPRESSIONS 09/03/2015 Therapy Diagnosis Severe pharyngeal phase dysphagia Clinical Impression Pt has a severe pharyngeal dysphaghia secondary to presence of supraglottic mass, which impedes bolus flow and decreased airway closure. All consistencies enter the airway during the swallow even with tsp-sized boluses. Aspiration is usually sensed, but she is not able to expel aspirates from her trachea. A chin tuck paired with an immediate cough facilitates clearance through the pharynx and clearance of penetrates with all consistencies tested; however, all liquids are then re-penetrated and aspirated after the swallow. Purees do not re-enter the airway, although aspiration risk remains due to residue. Recommend Dys  1 diet and pudding thick liquids with a chin tuck and immediate cough following all bites. Impact on safety and function Moderate aspiration risk;Risk for inadequate nutrition/hydration   CHL IP TREATMENT RECOMMENDATION 09/03/2015 Treatment Recommendations Therapy as outlined in treatment plan below   Prognosis 09/03/2015 Prognosis for Safe Diet Advancement Guarded Barriers to Reach Goals -- Barriers/Prognosis Comment -- CHL IP DIET RECOMMENDATION 09/03/2015 SLP Diet Recommendations Dysphagia 1 (Puree) solids;Pudding thick liquid Liquid Administration via Spoon Medication Administration Crushed with puree Compensations Slow rate;Small sips/bites;Chin  tuck;Hard cough after swallow Postural Changes Remain semi-upright after after feeds/meals (Comment);Seated upright at 90 degrees   CHL IP OTHER RECOMMENDATIONS 09/03/2015 Recommended Consults -- Oral Care Recommendations Oral care BID Other Recommendations Order thickener from pharmacy;Prohibited food (jello, ice cream, thin soups);Remove water pitcher   CHL IP FOLLOW UP RECOMMENDATIONS 09/03/2015 Follow up Recommendations Outpatient SLP;24 hour supervision/assistance   CHL IP FREQUENCY AND DURATION 09/03/2015 Speech Therapy Frequency (ACUTE ONLY) min 2x/week Treatment Duration 2 weeks      CHL IP ORAL PHASE 09/03/2015 Oral Phase WFL Oral - Pudding Teaspoon -- Oral - Pudding Cup -- Oral - Honey Teaspoon -- Oral - Honey Cup -- Oral - Nectar Teaspoon -- Oral - Nectar Cup -- Oral - Nectar Straw -- Oral - Thin Teaspoon -- Oral - Thin Cup -- Oral - Thin Straw -- Oral - Puree -- Oral - Mech Soft -- Oral - Regular -- Oral - Multi-Consistency -- Oral - Pill -- Oral Phase - Comment --  CHL IP PHARYNGEAL PHASE 09/03/2015 Pharyngeal Phase Impaired Pharyngeal- Pudding Teaspoon -- Pharyngeal -- Pharyngeal- Pudding Cup -- Pharyngeal -- Pharyngeal- Honey Teaspoon Reduced airway/laryngeal closure;Penetration/Aspiration during swallow;Penetration/Apiration after swallow;Pharyngeal residue - pyriform;Pharyngeal residue - posterior pharnyx;Compensatory strategies attempted (with notebox) Pharyngeal Material enters airway, passes BELOW cords and not ejected out despite cough attempt by patient Pharyngeal- Honey Cup -- Pharyngeal -- Pharyngeal- Nectar Teaspoon Reduced airway/laryngeal closure;Penetration/Aspiration during swallow;Penetration/Apiration after swallow;Pharyngeal residue - pyriform;Pharyngeal residue - posterior pharnyx;Compensatory strategies attempted (with notebox) Pharyngeal Material enters airway, passes BELOW cords and not ejected out despite cough attempt by patient Pharyngeal- Nectar Cup -- Pharyngeal -- Pharyngeal-  Nectar Straw -- Pharyngeal -- Pharyngeal- Thin Teaspoon Reduced airway/laryngeal closure;Penetration/Aspiration during swallow;Penetration/Apiration after swallow;Pharyngeal residue - pyriform;Pharyngeal residue - posterior pharnyx;Compensatory strategies attempted (with notebox) Pharyngeal Material enters airway, passes BELOW cords and not ejected out despite cough attempt by patient Pharyngeal- Thin Cup -- Pharyngeal -- Pharyngeal- Thin Straw Reduced airway/laryngeal closure;Penetration/Aspiration during swallow;Penetration/Apiration after swallow;Pharyngeal residue - pyriform;Pharyngeal residue - posterior pharnyx;Compensatory strategies attempted (with notebox) Pharyngeal Material enters airway, passes BELOW cords and not ejected out despite cough attempt by patient Pharyngeal- Puree Reduced airway/laryngeal closure;Penetration/Aspiration during swallow;Compensatory strategies attempted (with notebox);Pharyngeal residue - posterior pharnyx Pharyngeal Material enters airway, remains ABOVE vocal cords and not ejected out Pharyngeal- Mechanical Soft -- Pharyngeal -- Pharyngeal- Regular -- Pharyngeal -- Pharyngeal- Multi-consistency -- Pharyngeal -- Pharyngeal- Pill -- Pharyngeal -- Pharyngeal Comment --  CHL IP CERVICAL ESOPHAGEAL PHASE 09/03/2015 Cervical Esophageal Phase (No Data) Pudding Teaspoon -- Pudding Cup -- Honey Teaspoon -- Honey Cup -- Nectar Teaspoon -- Nectar Cup -- Nectar Straw -- Thin Teaspoon -- Thin Cup -- Thin Straw -- Puree -- Mechanical Soft -- Regular -- Multi-consistency -- Pill -- Cervical Esophageal Comment -- No flowsheet data found. Germain Osgood, M.A. CCC-SLP [phone] Germain Osgood 09/03/2015, 1:33 PM              [place]  09/07/2015  INDICATION: History of duodenal ulcer now with acute upper GI bleed. Please perform mesenteric arteriogram and potential percutaneous coil embolization. EXAM: 1. ULTRASOUND GUIDANCE FOR  ARTERIAL ACCESS. 2. SELECTIVE CELIAC AND SUPERIOR MESENTERIC ARTERIOGRAMS 3. SELECTED PROPER HEPATIC ARTERIOGRAM 4. SELECTIVE GASTRODUODENAL ARTERIOGRAM AND PERCUTANEOUS COIL EMBOLIZATION MEDICATIONS: None ANESTHESIA/SEDATION: Fentanyl 75 mcg IV; Versed 1.5 mg IV Moderate Sedation Time: 65 minutes; The patient was continuously monitored during the procedure by the interventional radiology nurse under my direct supervision. CONTRAST:  73m OMNIPAQUE IOHEXOL 300 MG/ML  SOLN FLUOROSCOPY TIME:  Fluoroscopy Time: 18 minutes 24 seconds (1595 mGy). COMPLICATIONS: SIR Level A - No therapy, no consequence. The procedure was complicated by non targeted embolization of a a 2 mm x 4 mm non fiber interlock coil within a cranial subsegmental branch vessel of the splenic artery. PROCEDURE: Informed consent was obtained from the patient following explanation of the procedure, risks, benefits and alternatives. The patient understands, agrees and consents for the procedure. All questions were addressed. A time out was performed prior to the initiation of the procedure. Maximal barrier sterile technique utilized including caps, mask, sterile gowns, sterile gloves, large sterile drape, hand hygiene, and Betadine prep. The right femoral head was marked fluoroscopically. Under ultrasound guidance, the right common femoral artery was accessed with a micropuncture kit after the overlying soft tissues were anesthetized with 1% lidocaine. An ultrasound image was saved for documentation purposes. The micropuncture sheath was exchanged for a 5 [place] sheath over a Bentson wire. A closure arteriogram was performed through the side of the sheath confirming access within the right common femoral artery. Over a Bentson wire, a Mickelson catheter was advanced to the level of the thoracic aorta where it was back bled and flushed. The catheter was then utilized to select the celiac artery and a selective celiac arteriogram was performed.  With the use of a Fathom micro wire, a regular Renegade micro catheter was utilized to select the proper hepatic artery and a selective proper hepatic arteriogram was performed. The microcatheter was then utilized to select the gastroduodenal artery and a selective gastroduodenal arteriogram was performed. The micro catheter was advanced further into the distal aspect of the gastroduodenal artery. Following sub selective injection, a 2 mm x 4 mm non fibered interlock coil was attempted to be deployed within the distal aspect of the gastroduodenal artery however it became apparent that the micro catheter I was given was NOT a regular sized catheter but rather was a high-flow catheter. As such, coil became entrapped within the distal aspect of the micro catheter. With some manipulation, the coil was ultimately safely deployed within a cranial subsegmental branch of the splenic artery. The incorrect the microcatheter was discarded and a regular Renegade micro catheter was again utilized to select the gastroduodenal artery. Sub selective injection was performed and the gastroduodenal artery was percutaneously coil embolized with multiple overlapping 2 mm, 3 mm and 4 mm interlock fibered and non-fibered coils to near the vessel's origin. A completion proper hepatic arteriogram was performed. The micro catheter was removed and completion celiac and superior mesenteric arteriograms were performed. Images reviewed and the procedure was terminated. All wires, catheters and sheaths were removed from the patient. Hemostasis was achieved at the right groin access site with deployment of an Exoseal closure device. A dressing was placed. The patient tolerated the procedure well without immediate postprocedural complication. FINDINGS: Celiac arteriogram demonstrates a conventional branching pattern. Superior mesenteric arteriogram failed to delineate any retrograde contribution to the gastroduodenal  artery. Selective proper  hepatic arteriogram demonstrates a markedly diminutive and irregular appearing gastroduodenal artery which was confirmed with selective gastroduodenal arteriogram. Ultimately successful percutaneous coil embolization of the gastroduodenal artery to near the vessels origin. Note, procedure was complicated by non target embolization of a cranial subsegmental branch vessel of the splenic artery. Completion proper hepatic arteriogram and demonstrates complete occlusion of the GDA. Completion celiac arteriogram demonstrates a hypertrophied gastroepiploic artery arising from the left gastric artery without definitive contribution to the duodenal bulb. IMPRESSION: 1. Technically successful prophylactic percutaneous coil embolization of the gastroduodenal artery for acute upper GI bleed. 2. Procedure complicated by non target embolization of a cranial subsegmental branch vessel of the splenic artery secondary to mismatch of catheter sizing. This non target coil is felt to be of no clinical significance. Above findings were discussed with Dr. Lala Lund , at the time of procedure completion. Electronically Signed   By: Sandi Mariscal M.D.   On: 09/07/2015 18:09    Micro Results      Recent Results (from the past 240 hour(s))  MRSA PCR Screening     Status: None   Collection Time: 09/04/15  7:03 PM  Result Value Ref Range Status   MRSA by PCR NEGATIVE NEGATIVE Final    Comment:        The GeneXpert MRSA Assay (FDA approved for NASAL specimens only), is one component of a comprehensive MRSA colonization surveillance program. It is not intended to diagnose MRSA infection nor to guide or monitor treatment for MRSA infections.        Today   Subjective    Nolan Lasser today has no headache,no chest abdominal pain,no new weakness tingling or numbness, feels much better wants to go home today.    Objective   Blood pressure 128/79, pulse 95, temperature 99.1 F (37.3 C), temperature source Oral,  resp. rate 18, height _0  (1.6 m), weight 113.399 kg (250 lb), SpO2 100 %.   Intake/Output Summary (Last 24 hours) at 09/12/15 1055 Last data filed at 09/12/15 0945  Gross per 24 hour  Intake 2135.74 ml  Output   2050 ml  Net  85.74 ml    Exam Awake Alert, Oriented x 3, No new F.N deficits, Normal affect .AT,PERRAL, trach site clear. Supple Neck,No JVD, No cervical lymphadenopathy appriciated.  Symmetrical Chest wall movement, Good air movement bilaterally, CTAB RRR,No Gallops,Rubs or new Murmurs, No Parasternal Heave +ve B.Sounds, Abd Soft, Non tender, No organomegaly appriciated, No rebound -guarding or rigidity. Right arm PICC line No Cyanosis, Clubbing or edema, No new Rash or bruise   Data Review   CBC w Diff:  Lab Results  Component Value Date   WBC 13.0* 09/12/2015   HGB 8.5* 09/12/2015   HCT 27.0* 09/12/2015   PLT 256 09/12/2015   LYMPHOPCT 16 09/12/2015   MONOPCT 6 09/12/2015   EOSPCT 1 09/12/2015   BASOPCT 0 09/12/2015    CMP:  Lab Results  Component Value Date   NA 138 09/12/2015   K 4.3 09/12/2015   CL 101 09/12/2015   CO2 28 09/12/2015   BUN 18 09/12/2015   CREATININE 0.72 09/12/2015   CREATININE 0.78 08/17/2013   PROT 5.8* 09/12/2015   ALBUMIN 1.9* 09/12/2015   BILITOT 0.1* 09/12/2015   ALKPHOS 91 09/12/2015   AST 15 09/12/2015   ALT 15 09/12/2015  .   Total Time in preparing paper work, data evaluation and todays exam - 35 minutes  Thurnell Lose M.D on 09/12/2015  at 10:55 AM  [facility]   Office  [phone]

## 2015-09-12 NOTE — Progress Notes (Signed)
SATURATION QUALIFICATIONS: (This note is used to comply with regulatory documentation for home oxygen)  Patient Saturations on Room Air at Rest = 88%      

## 2015-09-12 NOTE — Progress Notes (Addendum)
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   No Known Allergies  Patient Measurements: Height: 5\' 3"  (160 cm) Weight: 250 lb (113.399 kg) IBW/kg (Calculated) : 52.4 Adjusted Body Weight: 67 kg  Usual Weight: 113.4 BMI = 44   Vital Signs: Temp: 99.1 F (37.3 C) (01/30 0535) Temp Source: Oral (01/30 0535) BP: 128/79 mmHg (01/30 0535) Pulse Rate: 95 (01/30 0753) Intake/Output from previous day: 01/29 0701 - 01/30 0700 In: 2135.7 [I.V.:851.1; TPN:1284.7] Out: 2150 [Urine:2150] Intake/Output from this shift: Total I/O In: 0  Out: 300 [Urine:300]  Labs:  Recent Labs  09/12/15 0619  WBC 13.0*  HGB 8.5*  HCT 27.0*  PLT 256     Recent Labs  09/10/15 0459 09/11/15 0520 09/12/15 0619  NA 136 137 138  K 4.0 4.2 4.3  CL 103 100* 101  CO2 28 29 28   GLUCOSE 161* 172* 174*  BUN 13 16 18   CREATININE 0.70 0.58 0.72  CALCIUM 8.1* 8.4* 8.8*  MG  --  1.7 1.7  PHOS  --  4.2 4.1  PROT  --   --  5.8*  ALBUMIN  --   --  1.9*  AST  --   --  15  ALT  --   --  15  ALKPHOS  --   --  91  BILITOT  --   --  0.1*  PREALBUMIN  --   --  9.6*  TRIG  --   --  133   Estimated Creatinine Clearance: 91.8 mL/min (by C-G formula based on Cr of 0.72).    Recent Labs  09/11/15 2358 09/12/15 0609 09/12/15 0804  GLUCAP 167* 150* 165*    Assessment: 52 YOF who presented with worsening hoarseness of voice and abdominal pain. She had difficulty swallowing foods, N/V, weight loss and no appetite. CT neck found laryngeal mass and CT abdmonen revealed a left renal mass. She was also found to have a UTI with SIRS. Admitted for IV antibiotics. Underwent a biopsy by ENT on 1/20. Hospital course also complicated by development of lower GI bleeding and associated blood loss anemia.  Nutritional Goals:  Kcal: 1700-2000 Protein: 105-120 gm Fluid: 1.7-2 L  Plan:  Discharging patient today on TPN. [facility] following and will take over TPN. Faxed nutritional goals and last TPN cycling information [facility] at [facility].  Elenor Quinones, PharmD, BCPS Clinical Pharmacist Pager [phone] 09/12/2015 11:39 AM

## 2015-09-12 NOTE — Progress Notes (Signed)
Pharmacy Communication Note:  Pharmacy was called and informed that patient's discharge plan was cancelled for today. TPN bag was not made for today due to earlier plan for discharge. Unfortunately, we are unable to compound another TPN bag at this time. Will run Dextrose 10% @ 80 mL/hr per protocol which will provide 192 gm of dextrose and 653 calories. Please call [phone] for any questions.  Albertina Parr, PharmD., BCPS Clinical Pharmacist Pager [phone]

## 2015-09-12 NOTE — Telephone Encounter (Signed)
Call received from Carolynn Sayers, RN  - Infusion Coordinator, [facility] requesting a follow up appointment for the patient at [facility]. In addition a provider will need to sign orders for TPN. Case discussed with Dr Jarold Song who agreed to see the patient and sign the orders for TPN.  An appointment was scheduled for 09/16/15 @1045  and the information was placed on the AVS.  Update provided to P. Tamera Punt, RN.

## 2015-09-12 NOTE — Progress Notes (Signed)
RT changed patient trach mask. RT also cleaned around patient trach and replaced dressing. RT also attempted trach education with family, but family was not present at this time. RT will try again later. RT will continue to monitor.

## 2015-09-12 NOTE — Progress Notes (Signed)
Radiation oncology  I have met with the patient and discussed her options. She is not felt to be a good candidate for chemotherapy currently and therefore either surgery or radiation treatment would be her primary treatment options. Given the locally aggressive nature of this cancer, I would favor surgery upfront followed by potential other adjuvant treatment depending on her results and status at that time. The patient has been seen by ENT and is being set up to be seen at [facility] for surgical evaluation. The patient has indicated that she is interested in considering radiation treatment up front followed by salvage surgery if absolutely necessary. This would be an option but I do not feel that radiation alone would likely be able to eliminate the local disease.  The patient's case is scheduled to be discussed Wednesday at head and neck cancer conference. The pinning on the recommendation for conference, how she improves, and depending on her wishes I can see the patient as an outpatient.  ------------------------------------------------  Jodelle Gross, MD, PhD

## 2015-09-13 LAB — GLUCOSE, CAPILLARY
Glucose-Capillary: 139 mg/dL — ABNORMAL HIGH (ref 65–99)
Glucose-Capillary: 134 mg/dL — ABNORMAL HIGH (ref 65–99)
Glucose-Capillary: 144 mg/dL — ABNORMAL HIGH (ref 65–99)
Glucose-Capillary: 136 mg/dL — ABNORMAL HIGH (ref 65–99)

## 2015-09-13 MED ORDER — HEPARIN SOD (PORK) LOCK FLUSH 100 UNIT/ML IV SOLN
250.0000 [IU] | INTRAVENOUS | Status: AC | PRN
  Administered 2015-09-13: 500 [IU]

## 2015-09-13 NOTE — Progress Notes (Signed)
LM with daughter April that patient approved for [facility] meaning that she will be able to have a RN at the house everyday. Reminded April to come to hospital again today to teaching done prior to discharge.

## 2015-09-13 NOTE — Progress Notes (Signed)
Speech Language Pathology Treatment: Dysphagia;Passy Muir Speaking valve  Patient Details Name: Ashley Ramsey MRN: [id number] DOB: 1955-10-11 Today's Date: 09/13/2015 Time: 0957-1020 SLP Time Calculation (min) (ACUTE ONLY): 23 min  Assessment / Plan / Recommendation Clinical Impression  Skilled treatment session focused on addressing PMSV and dysphagia goals.  Upon SLP arrival inner canula was secured and PMSV was placed for ~20 minutes with SpO2 reading dropping to the low 90's x1.  SLP provided Mod cues to cough hard which effectively produced moderate amount of thick white-yellowish secretions from trach.  After expectoration patient demonstrated improved toleration with vital remaining WDL for the duration of the session.  Patient able to obtain intermittent phonation with a wet and strained vocal quality with moderately deceased breath support which resulted in poor intelligibility at the phrase level.  Cues to cough and expectorate were not effective at improving vocal quality.    SLP also facilitated session with education and rationale for frequent and through oral care.  Patient completed oral care via suctioning after set-up with Supervision verbal cues and consumed ice chip trials with anterior loss of boluses, prolonged oral phase, multiple swallows and continuation of wet vocal quality.  Patient consumed 3 ice chips with no cough or throat clears.  Recommend initiation of ice chips following oral care for pleasure as well as to maintain/restore swallow function.      HPI HPI: Pt is 60 y.o. female with h/o hypertension and NIDDM. Pt presented to ED with voice changes, difficult swallowing and abdominal discomfort that have progressed over the last month. 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 MBS 1/21 recommended Dys 1, pudding liquids. GI bleed, made NPO, swallow assessment re-ordered. Repeat MBS recommended NPO.        SLP Plan  Continue with current plan of care     Recommendations  Diet recommendations: NPO;Other(comment) (ice chips after oral care ) Medication Administration: Via alternative means      Patient may use Passy-Muir Speech Valve: During all waking hours (remove during sleep) PMSV Supervision: Full      Oral Care Recommendations: Oral care QID;Oral care prior to ice chip/H20 Follow up Recommendations: 24 hour supervision/assistance;Home health SLP Plan: Continue with current plan of care     GO               Carmelia Roller., CCC-SLP [place] 09/13/2015, 10:32 AM

## 2015-09-13 NOTE — Progress Notes (Signed)
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 She had difficulty swallowing foods, N/V, weight loss and no appetite. CT neck found laryngeal mass and CT abdmonen revealed a left renal mass. She was also found to have a UTI with SIRS. Admitted for IV antibiotics. Underwent a biopsy by ENT on 1/20. Hospital course also complicated by development of lower GI bleeding and associated blood loss anemia.  Nutritional Goals:  Kcal: 1700-2000 Protein: 105-120 gm Fluid: 1.7-2 L  Plan:  Patient was not able to be discharged yesterday so D10 bag hung at 59ml/hr in its place.  Goal is still to have patient go home today so will not order TPN for tonight. [facility] to take over TPN. If for whatever reason patient does not get discharged again we will plan to continue D10 at 41ml/hr.  Elenor Quinones, PharmD, BCPS Clinical Pharmacist Pager [phone] 09/13/2015 11:06 AM

## 2015-09-13 NOTE — Care Management Note (Signed)
Case Management Note  Patient Details  Name: Ashley Ramsey MRN: [id number] Date of Birth: 13-Dec-1955  Subjective/Objective:                 Discharge teaching complete with [name], daughter. Suction and Oxygen at bedside for transport, patient and daughter to go home private car. Bedside nurse to call [facility] with [facility] when patient leaves so that home supplies can be delivered. Patient made Page. Given MATCH. Followed by TCC at [facility].   Action/Plan:  DC to home w/ home health [place] Expected Discharge Date:                  Expected Discharge Plan:  [place]  In-House Referral:     Discharge planning Services  CM Consult, [facility] Program  Post Acute Care Choice:  Durable Medical Equipment, Home Health Choice offered to:  Patient  DME Arranged:  Trach supplies, Nebulizer machine, Oxygen, IV pump/equipment, Walker rolling, Hospital bed DME Agency:  [place]:  RN [facility] could not be provided for PT OT SLP, no qualifying diagnosis per [facility].) HH Agency:  [place]  Status of Service:  Completed, signed off  Medicare Important Message Given:    Date Medicare IM Given:    Medicare IM give by:    Date Additional Medicare IM Given:    Additional Medicare Important Message give by:     If discussed at [place] of Stay Meetings, dates discussed:    Additional Comments:  Carles Collet, RN 09/13/2015, 2:44 PM

## 2015-09-13 NOTE — Progress Notes (Signed)
D/C to home w/ DTR via w/c all belongings and d/c instructions given.In good spirits.Voices no c/o.

## 2015-09-13 NOTE — Progress Notes (Signed)
Rt has completed Trach teaching as well as pam w/ infusion for TPN.All teaching reinforced by this nursew/ verbal understanding.

## 2015-09-13 NOTE — Progress Notes (Signed)
Patient made [place] with [place] per medical advisor Dr Reynaldo Minium due to high risk for readmission, complex discharge diagnosis and treatment requiring TPN, and new tracheostomy.

## 2015-09-13 NOTE — Hospital Discharge Follow-Up (Signed)
Attempted to meet with the patient and her daughter to review the services provided at the [facility] as well as remind them of the importance of keeping the appointment with Dr Jarold Song on 09/16/15@ 1045. The patient's nurse was reviewing the discharge was in the process of reviewing the discharge instructions when CM arrived. This CM noted that the appointment date/time is on the AVS along with the other upcoming appointments. At that time, the patient's daughter was receiving a great deal of discharge information and important instructions. Further information about [facility] services will be explained at the clinic visit.   Update provided to D. Swist, RN CM.

## 2015-09-13 NOTE — Progress Notes (Signed)
Patient and family seen for follow up trach education.  Daughter was in the room at this time to go over care.  We discussed suctioning, trach care, and emergency procedures.  Daughter demonstrated deep suction, trach care, inner cannula care, and what to do in an emergency.  The patient and family felt comfortable with taking care of the trach at this point, and I feel the education was well received. Neither had questions at this time.

## 2015-09-14 ENCOUNTER — Emergency Department (HOSPITAL_COMMUNITY): Payer: Medicaid Other

## 2015-09-14 ENCOUNTER — Emergency Department (HOSPITAL_COMMUNITY)
Admission: EM | Admit: 2015-09-14 | Discharge: 2015-09-14 | Disposition: A | Discharge status: Home / Self Care | Payer: Medicaid Other | Attending: Emergency Medicine | Admitting: Emergency Medicine

## 2015-09-14 ENCOUNTER — Encounter (HOSPITAL_COMMUNITY): Payer: Self-pay | Admitting: Emergency Medicine

## 2015-09-14 DIAGNOSIS — Z79899 Other long term (current) drug therapy: Secondary | ICD-10-CM | POA: Insufficient documentation

## 2015-09-14 DIAGNOSIS — R221 Localized swelling, mass and lump, neck: Secondary | ICD-10-CM | POA: Insufficient documentation

## 2015-09-14 DIAGNOSIS — Z87891 Personal history of nicotine dependence: Secondary | ICD-10-CM | POA: Diagnosis not present

## 2015-09-14 DIAGNOSIS — D649 Anemia, unspecified: Secondary | ICD-10-CM | POA: Diagnosis not present

## 2015-09-14 DIAGNOSIS — Z930 Tracheostomy status: Secondary | ICD-10-CM | POA: Diagnosis not present

## 2015-09-14 DIAGNOSIS — E119 Type 2 diabetes mellitus without complications: Secondary | ICD-10-CM | POA: Diagnosis not present

## 2015-09-14 DIAGNOSIS — Z87448 Personal history of other diseases of urinary system: Secondary | ICD-10-CM | POA: Insufficient documentation

## 2015-09-14 DIAGNOSIS — I10 Essential (primary) hypertension: Secondary | ICD-10-CM | POA: Diagnosis not present

## 2015-09-14 DIAGNOSIS — Z794 Long term (current) use of insulin: Secondary | ICD-10-CM | POA: Insufficient documentation

## 2015-09-14 DIAGNOSIS — Z8521 Personal history of malignant neoplasm of larynx: Secondary | ICD-10-CM | POA: Diagnosis not present

## 2015-09-14 DIAGNOSIS — R05 Cough: Secondary | ICD-10-CM | POA: Diagnosis present

## 2015-09-14 LAB — CBC WITH DIFFERENTIAL/PLATELET
BASOS ABS: 0 10*3/uL (ref 0.0–0.1)
BASOS PCT: 0 %
EOS PCT: 1 %
Eosinophils Absolute: 0.1 10*3/uL (ref 0.0–0.7)
HCT: 29.3 % — ABNORMAL LOW (ref 36.0–46.0)
Hemoglobin: 9.6 g/dL — ABNORMAL LOW (ref 12.0–15.0)
Lymphocytes Relative: 13 %
Lymphs Abs: 1.8 10*3/uL (ref 0.7–4.0)
MCH: 28.5 pg (ref 26.0–34.0)
MCHC: 32.8 g/dL (ref 30.0–36.0)
MCV: 86.9 fL (ref 78.0–100.0)
MONO ABS: 0.7 10*3/uL (ref 0.1–1.0)
MONOS PCT: 5 %
Neutro Abs: 11.2 10*3/uL — ABNORMAL HIGH (ref 1.7–7.7)
Neutrophils Relative %: 81 %
PLATELETS: 324 10*3/uL (ref 150–400)
RBC: 3.37 MIL/uL — ABNORMAL LOW (ref 3.87–5.11)
RDW: 14.5 % (ref 11.5–15.5)
WBC: 13.8 10*3/uL — ABNORMAL HIGH (ref 4.0–10.5)

## 2015-09-14 LAB — BASIC METABOLIC PANEL
ANION GAP: 10 (ref 5–15)
BUN: 16 mg/dL (ref 6–20)
CALCIUM: 9.2 mg/dL (ref 8.9–10.3)
CO2: 28 mmol/L (ref 22–32)
CREATININE: 0.89 mg/dL (ref 0.44–1.00)
Chloride: 99 mmol/L — ABNORMAL LOW (ref 101–111)
GLUCOSE: 186 mg/dL — AB (ref 65–99)
Potassium: 4.5 mmol/L (ref 3.5–5.1)
Sodium: 137 mmol/L (ref 135–145)

## 2015-09-14 NOTE — ED Notes (Signed)
RT at the bedside.

## 2015-09-14 NOTE — ED Notes (Signed)
RT called to come talk with pt family about trach care.

## 2015-09-14 NOTE — Discharge Planning (Signed)
ERCM consulted for possible pt placement.  ERCM reviewed chart and spoke with Eulogio Bear ist, CM5W to find that pt and family received trach care teaching, demonstrated proper trach care through teach back and is on [place].  RN will order RT to teach trach care again prior to sending pt home.

## 2015-09-14 NOTE — ED Notes (Signed)
Talked with Case Management and Social Work about family concerns. Staff brought to the bedside to talk with family members. Family states they understand proper suction techniques and pt states she is ready to go home. EDP and charge aware

## 2015-09-14 NOTE — ED Notes (Signed)
RT at the bedside to suction patient.

## 2015-09-14 NOTE — Discharge Instructions (Signed)
Follow-up as planned 

## 2015-09-14 NOTE — ED Notes (Addendum)
Pt. arrived with EMS from home reports persistent productive cough this evening , family stated difficulty suctioning tenacious phlegm this evening , arrived with O2 sat= 99% , tracheostomy intact , respiratory therapist suctioned pt. at arrival . Tracheostomy performed here last 09/04/15 , admitted for laryngeal cancer 08/29/15. Pt. Has a PICC line at right upper arm receiving TPN infusion .

## 2015-09-14 NOTE — ED Provider Notes (Signed)
  Physical Exam  BP 112/65 mmHg  Pulse 90  Temp(Src) 99.7 F (37.6 C) (Oral)  Resp 18  SpO2 99%  Physical Exam  ED Course  Procedures  MDM Patient is seen by social work/case management. Patient does not have funds for nursing home and also does not want to go to 1. Patient has been given more instructions on suctioning. She was taught how to do it herself. She and family members do not want her placed. Has home health at home. Will discharge home.      Davonna Belling, MD 09/14/15 1332

## 2015-09-14 NOTE — ED Provider Notes (Signed)
CSN: [id number]     Arrival date & time 09/14/15  2778 History  By signing my name below, I, Altamease Oiler, attest that this documentation has been prepared under the direction and in the presence of Merryl Hacker, MD. Electronically Signed: Altamease Oiler, ED Scribe. 09/14/2015. 3:49 AM   Chief Complaint  Patient presents with  . Cough   The history is provided by the patient and the EMS personnel. No language interpreter was used.   Brought in by EMS, Ashley Ramsey is a 60 y.o. female with history of laryngeal cancer and tracheostomy tube placement 9 days ago who presents to the Emergency Department complaining of a cough productive of thick sputum with onset last evening. Pt denies fever, increased SOB, and chest pain. Pt was discharged from the hospital yesterday after being treated for laryngeal cancer.  Patient has a recent trach. She has a poor prognosis regarding her cancer. She was sent home with her family with home health care. Per EMS, patient was mucous plugging, she cleared in route and has improved.  Per EMS report, she was taken home yesterday apparently 4 PM. They have been called out twice prior to transporting her back to the ER. Her family has been unavailable. Multiple times I tried to reach them by phone. Per the respiratory therapist who is familiar with the patient, patient had difficulty with her secretions in the hospital and required aggressive suctioning. Family was educated.  Past Medical History  Diagnosis Date  . Thyroid disease   . Hypertension   . Neck mass hospitalized 08/29/2015  . Type II diabetes mellitus ([place])   . Left kidney mass   . Anemia, chronic disease    Past Surgical History  Procedure Laterality Date  . No past surgeries    . Esophagogastroduodenoscopy N/A 09/04/2015    Procedure: ESOPHAGOGASTRODUODENOSCOPY (EGD);  Surgeon: Manus Gunning, MD;  Location: [place];  Service: Gastroenterology;  Laterality: N/A;  . Direct  laryngoscopy N/A 09/02/2015    Procedure: DIRECT LARYNGOSCOPY WITH BIOPSY;  Surgeon: Izora Gala, MD;  Location: [place];  Service: ENT;  Laterality: N/A;  . Esophagoscopy N/A 09/02/2015    Procedure: ESOPHAGOSCOPY;  Surgeon: Izora Gala, MD;  Location: [place];  Service: ENT;  Laterality: N/A;  . Tracheostomy tube placement N/A 09/04/2015    Procedure: TRACHEOSTOMY;  Surgeon: Izora Gala, MD;  Location: [place];  Service: ENT;  Laterality: N/A;  . Direct laryngoscopy  09/04/2015    Procedure: DIRECT View LARYNGOSCOPY with cautery of of tumor;  Surgeon: Izora Gala, MD;  Location: [facility] OR;  Service: ENT;;   Family History  Problem Relation Age of Onset  . Diabetes Mother   . Hypertension Mother   . Heart disease Mother   . Diabetes Sister   . Hypertension Sister   . Cancer Daughter     Stomach   Social History  Substance Use Topics  . Smoking status: Former Smoker -- 0.00 packs/day for 34 years    Types: Cigarettes    Quit date: 07/14/2015  . Smokeless tobacco: Never Used  . Alcohol Use: 15.0 oz/week    25 Shots of liquor per week     Comment: 08/29/2015 "I drink 1/5th liquor one 1 day/week"   OB History    No data available     Review of Systems  Unable to perform ROS: Patient nonverbal  Constitutional: Negative for fever.  Respiratory: Positive for cough. Negative for chest tightness and shortness of breath.  Cardiovascular: Negative for chest pain.  Skin: Negative for wound.  All other systems reviewed and are negative.   Allergies  Review of patient's allergies indicates no known allergies.  Home Medications   Prior to Admission medications   Medication Sig Start Date End Date Taking? Authorizing Provider  acetaminophen (TYLENOL) 325 MG suppository Place 1 suppository (325 mg total) rectally every 4 (four) hours as needed for mild pain. 09/12/15   Leroy Sea, MD  albuterol (PROVENTIL) (2.5 MG/3ML) 0.083% nebulizer solution Take 3 mLs (2.5 mg total) by nebulization every  6 (six) hours as needed for wheezing or shortness of breath. 09/12/15   Leroy Sea, MD  glucose blood (FREESTYLE LITE) test strip For glucose testing every before meals at bedtime. Diagnosis E 11.65  Can substitute to any accepted brand 09/12/15   Leroy Sea, MD  glucose monitoring kit (FREESTYLE) monitoring kit 1 each by Does not apply route 3 (three) times daily before meals. 07/17/13   Doris Cheadle, MD  glucose monitoring kit (FREESTYLE) monitoring kit 1 each by Does not apply route 4 (four) times daily - after meals and at bedtime. 1 month Diabetic Testing Supplies for QAC-QHS accuchecks.Any brand OK. Diagnosis E11.65 09/12/15   Leroy Sea, MD  insulin aspart (NOVOLOG) 100 UNIT/ML injection Before each meal 3 times a day, 140-199 - 2 units, 200-250 - 4 units, 251-299 - 6 units,  300-349 - 8 units,  350 or above 10 units. Dispense syringes and needles as needed, Ok to switch to PEN if approved. Substitute to any brand approved. DX DM2, Code E11.65 09/12/15   Leroy Sea, MD  nicotine (NICODERM CQ - DOSED IN MG/24 HOURS) 21 mg/24hr patch Place 1 patch (21 mg total) onto the skin daily. 09/12/15   Leroy Sea, MD  NONFORMULARY OR COMPOUNDED ITEM TPN with electrolytes and Fat emulsion per [facility] via PICC start at 80cc/hr and adjust as needed 09/12/15   Leroy Sea, MD  promethazine (PHENERGAN) 25 MG suppository Place 1 suppository (25 mg total) rectally every 6 (six) hours as needed for nausea or vomiting. 09/12/15   Leroy Sea, MD  scopolamine (TRANSDERM-SCOP) 1 MG/3DAYS Place 1 patch (1.5 mg total) onto the skin every 3 (three) days. 09/12/15   Leroy Sea, MD  Vitamin D, Ergocalciferol, (DRISDOL) 50000 UNITS CAPS capsule Take 1 capsule (50,000 Units total) by mouth every 7 (seven) days. 08/18/13   Doris Cheadle, MD   BP 129/72 mmHg  Pulse 103  Temp(Src) 99.7 F (37.6 C) (Oral)  Resp 16  SpO2 99% Physical Exam  Constitutional: She is oriented to person,  place, and time.  Chronically ill-appearing, no acute distress, morbidly obese  HENT:  Head: Normocephalic and atraumatic.  Spitting a lot  Neck: Neck supple.  Trach in place, no obvious bleeding or mucus  Cardiovascular: Normal rate, regular rhythm and normal heart sounds.   No murmur heard. Pulmonary/Chest: Effort normal. No respiratory distress. She has no wheezes.  Coarse breath sounds bilaterally, limited by habitus, coughing  Abdominal: Soft. There is no tenderness.  Musculoskeletal: She exhibits edema.  Neurological: She is alert and oriented to person, place, and time.  Skin: Skin is warm and dry.  Psychiatric: She has a normal mood and affect.  Nursing note and vitals reviewed.   ED Course  Procedures (including critical care time)   COORDINATION OF CARE: 3:34 AM Discussed treatment plan which includes CXR with pt at bedside and pt agreed to  plan.  Labs Review Labs Reviewed  CBC WITH DIFFERENTIAL/PLATELET  BASIC METABOLIC PANEL    Imaging Review Dg Chest 2 View  09/12/2015  CLINICAL DATA:  Tracheostomy, cough, shortness of breath EXAM: CHEST  2 VIEW COMPARISON:  CT chest 08/29/2015 FINDINGS: There is a tracheostomy tube in satisfactory position. There is no focal parenchymal opacity. There is no pleural effusion or pneumothorax. The heart and mediastinal contours are unremarkable. The osseous structures are unremarkable. IMPRESSION: No active cardiopulmonary disease. Electronically Signed   By: Kathreen Devoid   On: 09/12/2015 09:07   Dg Chest Portable 1 View  09/14/2015  CLINICAL DATA:  Shortness of breath tonight. EXAM: PORTABLE CHEST 1 VIEW COMPARISON:  Frontal and lateral views 2 days prior 09/12/2015 FINDINGS: Tracheostomy tube at the thoracic inlet. Tip of the right upper extremity PICC in the mid-distal SVC. Cardiomediastinal contours are unchanged. Mild bibasilar atelectasis. No consolidation to suggest pneumonia. No large pleural effusion or pneumothorax. IMPRESSION:  1. Tracheostomy tube in right upper extremity PICC in place. 2. Mild bibasilar atelectasis. Electronically Signed   By: Jeb Levering M.D.   On: 09/14/2015 03:52   I have personally reviewed and evaluated these images as part of my medical decision-making.   EKG Interpretation None      MDM   Final diagnoses:  None    Patient presents with increased coughing at home and mucus plugging per EMS. She is well-known to our respiratory therapy team and was just discharged yesterday. She is currently in no acute distress. Repeat x-ray is reassuring. EMS initially reported that the family was en route. However, no one has shown up and after multiple phone calls, we are unable to contact the family. We'll have case management evaluate the patient. If we are unable to contact family and they're unwilling to take the patient home, she may need placement.  I personally performed the services described in this documentation, which was scribed in my presence. The recorded information has been reviewed and is accurate.   Merryl Hacker, MD 09/14/15 [phone]

## 2015-09-15 ENCOUNTER — Telehealth: Payer: Self-pay

## 2015-09-15 ENCOUNTER — Emergency Department (HOSPITAL_COMMUNITY)
Admission: EM | Admit: 2015-09-15 | Discharge: 2015-09-15 | Disposition: A | Discharge status: Home / Self Care | Payer: Medicaid Other | Attending: Emergency Medicine | Admitting: Emergency Medicine

## 2015-09-15 ENCOUNTER — Encounter (HOSPITAL_COMMUNITY): Payer: Self-pay | Admitting: Emergency Medicine

## 2015-09-15 DIAGNOSIS — Z87891 Personal history of nicotine dependence: Secondary | ICD-10-CM | POA: Diagnosis not present

## 2015-09-15 DIAGNOSIS — Z87448 Personal history of other diseases of urinary system: Secondary | ICD-10-CM | POA: Insufficient documentation

## 2015-09-15 DIAGNOSIS — Z930 Tracheostomy status: Secondary | ICD-10-CM | POA: Diagnosis not present

## 2015-09-15 DIAGNOSIS — E119 Type 2 diabetes mellitus without complications: Secondary | ICD-10-CM | POA: Insufficient documentation

## 2015-09-15 DIAGNOSIS — Z8521 Personal history of malignant neoplasm of larynx: Secondary | ICD-10-CM | POA: Insufficient documentation

## 2015-09-15 DIAGNOSIS — T8249XA Other complication of vascular dialysis catheter, initial encounter: Secondary | ICD-10-CM | POA: Insufficient documentation

## 2015-09-15 DIAGNOSIS — Y658 Other specified misadventures during surgical and medical care: Secondary | ICD-10-CM | POA: Insufficient documentation

## 2015-09-15 DIAGNOSIS — I10 Essential (primary) hypertension: Secondary | ICD-10-CM | POA: Insufficient documentation

## 2015-09-15 DIAGNOSIS — Z862 Personal history of diseases of the blood and blood-forming organs and certain disorders involving the immune mechanism: Secondary | ICD-10-CM | POA: Insufficient documentation

## 2015-09-15 DIAGNOSIS — Z79899 Other long term (current) drug therapy: Secondary | ICD-10-CM | POA: Insufficient documentation

## 2015-09-15 DIAGNOSIS — Z789 Other specified health status: Secondary | ICD-10-CM

## 2015-09-15 DIAGNOSIS — K7689 Other specified diseases of liver: Secondary | ICD-10-CM | POA: Diagnosis not present

## 2015-09-15 DIAGNOSIS — T859XXA Unspecified complication of internal prosthetic device, implant and graft, initial encounter: Secondary | ICD-10-CM

## 2015-09-15 DIAGNOSIS — Z794 Long term (current) use of insulin: Secondary | ICD-10-CM | POA: Insufficient documentation

## 2015-09-15 NOTE — ED Notes (Signed)
After finding documentation of verification of PICC placement (x-ray done here yesterday), was able to pull back blood from PICC line and both ports flushed well. PICC line is okay, patient states she couldn't get the device to stop beeping.

## 2015-09-15 NOTE — ED Notes (Signed)
Bed: [id number] Expected date:  Expected time:  Means of arrival:  Comments: EMS - feeding tube

## 2015-09-15 NOTE — Progress Notes (Signed)
Rt called to room WA11 for trach pt. Pt currently not here for trach problems. Pt has a #6 Shiley cuffless  and on RA. Pt in no distresss,vitals stable.

## 2015-09-15 NOTE — Telephone Encounter (Signed)
This Case Manager placed call to patient to remind her of upcoming appointment at [facility] and [facility]. Patient's daughter answered and indicated patient unable to speak on phone due to trach; however, she is aware of upcoming appointment with Dr. Jarold Song. Appointment scheduled for 09/16/15 at 1045 with Dr. Jarold Song.

## 2015-09-15 NOTE — ED Notes (Signed)
Family finally arrived and distressed to find that patient needed to be suctioned. I suctioned patient. They then requested that we give her her morning home medications and clean out the inner cannula of her trach. I informed them that she had been discharged already for the problem she was originally here to be seen for. And that if she needed the inner cannula cleaned that I could call respiratory to come clean it. They stated "What? Call respiratory? Can't you do it?! Are you even a nurse?" I explained that nurses don't perform trach care in the ER, and I then called respiratory who said they were on their way down. Informed the charge nurse of patient's upset family members. Once I came back over to my section they had placed the patient in a wheelchair and began to roll her out to the lobby saying they would just go home and take care of her.

## 2015-09-15 NOTE — ED Notes (Signed)
Ashley Ramsey - contacted for patient pick up. Stated he was on his way and would be here shortly.

## 2015-09-15 NOTE — ED Notes (Addendum)
Per EMS, patient's feeding tube wasn't working this morning. Home care nurse stated the tubing may be clogged. No pain  Upon assessment of patient - patient has a right upper arm PICC line that was receiving TPN when it stopped working this morning. Patient and caregiver unsure if it's the pump that's broken or if her newly placed PICC line is the problem.

## 2015-09-15 NOTE — ED Provider Notes (Signed)
CSN: [id number]     Arrival date & time 09/15/15  6213 History   First MD Initiated Contact with Patient 09/15/15 [phone]     Chief Complaint  Patient presents with  . PICC problem     HPI Patient presents to the emergency room for evaluation of issues with a PICC line. The patient has a history of laryngeal cancer status post tracheostomy. She had a trach placed approximately 10 days ago. Patient was discharged from the hospital 2 days ago. Patient was discharged home with family in home care. She was in the emergency room yesterday because she was having some issues with mucus plugging. The patient was treated and ultimately released. Patient has not been interested in any inpatient nursing care.  Patient states she was doing well today but came into the emergency room because her PICC line is not functioning. She is supposed to be receiving TPN.  The pump started beeping today. The patient called 911 to be brought into the emergency room for evaluation. She denies any other complaints. She is not having any fevers or chills. Past Medical History  Diagnosis Date  . Thyroid disease   . Hypertension   . Neck mass hospitalized 08/29/2015  . Type II diabetes mellitus ([place])   . Left kidney mass   . Anemia, chronic disease    Past Surgical History  Procedure Laterality Date  . No past surgeries    . Esophagogastroduodenoscopy N/A 09/04/2015    Procedure: ESOPHAGOGASTRODUODENOSCOPY (EGD);  Surgeon: Manus Gunning, MD;  Location: [place];  Service: Gastroenterology;  Laterality: N/A;  . Direct laryngoscopy N/A 09/02/2015    Procedure: DIRECT LARYNGOSCOPY WITH BIOPSY;  Surgeon: Izora Gala, MD;  Location: [place];  Service: ENT;  Laterality: N/A;  . Esophagoscopy N/A 09/02/2015    Procedure: ESOPHAGOSCOPY;  Surgeon: Izora Gala, MD;  Location: [place];  Service: ENT;  Laterality: N/A;  . Tracheostomy tube placement N/A 09/04/2015    Procedure: TRACHEOSTOMY;  Surgeon: Izora Gala, MD;  Location:  [place];  Service: ENT;  Laterality: N/A;  . Direct laryngoscopy  09/04/2015    Procedure: DIRECT View LARYNGOSCOPY with cautery of of tumor;  Surgeon: Izora Gala, MD;  Location: [facility] OR;  Service: ENT;;   Family History  Problem Relation Age of Onset  . Diabetes Mother   . Hypertension Mother   . Heart disease Mother   . Diabetes Sister   . Hypertension Sister   . Cancer Daughter     Stomach   Social History  Substance Use Topics  . Smoking status: Former Smoker -- 0.00 packs/day for 34 years    Types: Cigarettes    Quit date: 07/14/2015  . Smokeless tobacco: Never Used  . Alcohol Use: 15.0 oz/week    25 Shots of liquor per week     Comment: 08/29/2015 "I drink 1/5th liquor one 1 day/week"   OB History    No data available     Review of Systems  All other systems reviewed and are negative.     Allergies  Review of patient's allergies indicates no known allergies.  Home Medications   Prior to Admission medications   Medication Sig Start Date End Date Taking? Authorizing Provider  acetaminophen (TYLENOL) 325 MG suppository Place 1 suppository (325 mg total) rectally every 4 (four) hours as needed for mild pain. 09/12/15   Thurnell Lose, MD  albuterol (PROVENTIL) (2.5 MG/3ML) 0.083% nebulizer solution Take 3 mLs (2.5 mg total) by nebulization every 6 (  six) hours as needed for wheezing or shortness of breath. 09/12/15   Thurnell Lose, MD  glucose blood (FREESTYLE LITE) test strip For glucose testing every before meals at bedtime. Diagnosis E 11.65  Can substitute to any accepted brand 09/12/15   Thurnell Lose, MD  glucose monitoring kit (FREESTYLE) monitoring kit 1 each by Does not apply route 3 (three) times daily before meals. 07/17/13   Lorayne Marek, MD  glucose monitoring kit (FREESTYLE) monitoring kit 1 each by Does not apply route 4 (four) times daily - after meals and at bedtime. 1 month Diabetic Testing Supplies for QAC-QHS accuchecks.Any brand OK. Diagnosis  E11.65 09/12/15   Thurnell Lose, MD  insulin aspart (NOVOLOG) 100 UNIT/ML injection Before each meal 3 times a day, 140-199 - 2 units, 200-250 - 4 units, 251-299 - 6 units,  300-349 - 8 units,  350 or above 10 units. Dispense syringes and needles as needed, Ok to switch to PEN if approved. Substitute to any brand approved. DX DM2, Code E11.65 09/12/15   Thurnell Lose, MD  nicotine (NICODERM CQ - DOSED IN MG/24 HOURS) 21 mg/24hr patch Place 1 patch (21 mg total) onto the skin daily. 09/12/15   Thurnell Lose, MD  NONFORMULARY OR COMPOUNDED ITEM TPN with electrolytes and Fat emulsion per [facility] via PICC start at 80cc/hr and adjust as needed 09/12/15   Thurnell Lose, MD  promethazine (PHENERGAN) 25 MG suppository Place 1 suppository (25 mg total) rectally every 6 (six) hours as needed for nausea or vomiting. 09/12/15   Thurnell Lose, MD  scopolamine (TRANSDERM-SCOP) 1 MG/3DAYS Place 1 patch (1.5 mg total) onto the skin every 3 (three) days. 09/12/15   Thurnell Lose, MD  Vitamin D, Ergocalciferol, (DRISDOL) 50000 UNITS CAPS capsule Take 1 capsule (50,000 Units total) by mouth every 7 (seven) days. 08/18/13   Lorayne Marek, MD   BP 119/67 mmHg  Pulse 88  Temp(Src) 98.3 F (36.8 C) (Oral)  Resp 18  SpO2 97% Physical Exam  Constitutional: She appears well-developed and well-nourished. No distress.  HENT:  Head: Normocephalic and atraumatic.  Right Ear: External ear normal.  Left Ear: External ear normal.  Eyes: Conjunctivae are normal. Right eye exhibits no discharge. Left eye exhibits no discharge. No scleral icterus.  Neck: Neck supple. No tracheal deviation present.  Neck mass, tracheostomy in place  Cardiovascular: Normal rate, regular rhythm and normal heart sounds.   Pulmonary/Chest: Effort normal. No stridor. No respiratory distress. She has no wheezes.  Abdominal: Soft. There is no tenderness.  Musculoskeletal: She exhibits no edema.  Neurological: She is alert. Cranial  nerve deficit: no gross deficits.  Skin: Skin is warm and dry. No rash noted.  Psychiatric: She has a normal mood and affect.  Nursing note and vitals reviewed.   ED Course  Procedures (including critical care time)  Imaging Review Dg Chest Portable 1 View  09/14/2015  CLINICAL DATA:  Shortness of breath tonight. EXAM: PORTABLE CHEST 1 VIEW COMPARISON:  Frontal and lateral views 2 days prior 09/12/2015 FINDINGS: Tracheostomy tube at the thoracic inlet. Tip of the right upper extremity PICC in the mid-distal SVC. Cardiomediastinal contours are unchanged. Mild bibasilar atelectasis. No consolidation to suggest pneumonia. No large pleural effusion or pneumothorax. IMPRESSION: 1. Tracheostomy tube in right upper extremity PICC in place. 2. Mild bibasilar atelectasis. Electronically Signed   By: Jeb Levering M.D.   On: 09/14/2015 03:52   I have personally reviewed and evaluated these  images and lab results as part of my medical decision-making.   MDM   Final diagnoses:  On total parenteral nutrition  Device, implant, or graft complication, initial encounter    The patient's PICC line was flushed by the ED nurse. The line flushes easily without any difficulty.  I looked at the patient's PICC line pump and states that infusion is completed however the patient has a full bag of TPN.  I spoke with [facility] who is managing her TPN infusions. Pharmacist felt that most likely the pump was not reset before the infusion was started. I will send one of their nurses here to the emergency room to provide new tubing and go over the use of the pump with the patient.  1000  [facility] called back to the ED. It will take them longer to come and get her checked out here than it would if she went home and went to her house. The nurse that is available is in the area of the patient's home and not close to [facility] ED.   Patient's not having any acute medical issues. Her PICC line is functioning  properly.  she needs some education on how to use the pump properly.  They will go to her home today to help her with that.  Dorie Rank, MD 09/15/15 1014

## 2015-09-16 ENCOUNTER — Ambulatory Visit: Payer: Medicaid Other | Attending: Family Medicine | Admitting: Family Medicine

## 2015-09-16 ENCOUNTER — Encounter: Payer: Self-pay | Admitting: Family Medicine

## 2015-09-16 VITALS — BP 129/85 | HR 108 | Temp 98.6°F | Resp 16 | Ht 67.0 in | Wt 205.0 lb

## 2015-09-16 DIAGNOSIS — K269 Duodenal ulcer, unspecified as acute or chronic, without hemorrhage or perforation: Secondary | ICD-10-CM | POA: Insufficient documentation

## 2015-09-16 DIAGNOSIS — E559 Vitamin D deficiency, unspecified: Secondary | ICD-10-CM | POA: Insufficient documentation

## 2015-09-16 DIAGNOSIS — C329 Malignant neoplasm of larynx, unspecified: Secondary | ICD-10-CM | POA: Diagnosis not present

## 2015-09-16 DIAGNOSIS — R1310 Dysphagia, unspecified: Secondary | ICD-10-CM | POA: Insufficient documentation

## 2015-09-16 DIAGNOSIS — Z79899 Other long term (current) drug therapy: Secondary | ICD-10-CM | POA: Diagnosis not present

## 2015-09-16 DIAGNOSIS — I10 Essential (primary) hypertension: Secondary | ICD-10-CM | POA: Diagnosis present

## 2015-09-16 DIAGNOSIS — E119 Type 2 diabetes mellitus without complications: Secondary | ICD-10-CM | POA: Insufficient documentation

## 2015-09-16 DIAGNOSIS — Z794 Long term (current) use of insulin: Secondary | ICD-10-CM | POA: Diagnosis not present

## 2015-09-16 DIAGNOSIS — N2889 Other specified disorders of kidney and ureter: Secondary | ICD-10-CM | POA: Diagnosis not present

## 2015-09-16 LAB — POCT GLYCOSYLATED HEMOGLOBIN (HGB A1C): HEMOGLOBIN A1C: 6.2

## 2015-09-16 LAB — GLUCOSE, POCT (MANUAL RESULT ENTRY): POC GLUCOSE: 116 mg/dL — AB (ref 70–99)

## 2015-09-16 MED ORDER — TRUEPLUS LANCETS 28G MISC: 1.0000 | Freq: Three times a day (TID) | Status: AC

## 2015-09-16 MED ORDER — TRUE METRIX METER DEVI: 1.0000 | Freq: Three times a day (TID) | Status: AC

## 2015-09-16 MED ORDER — GLUCOSE BLOOD VI STRP: ORAL_STRIP | Status: AC

## 2015-09-16 MED ORDER — VITAMIN D3 1200 UNIT/15ML PO LIQD: 15.0000 mL | Freq: Every day | ORAL | Status: DC

## 2015-09-16 MED FILL — TRUE METRIX TEST STRIP: 33 days supply | Qty: 100 | Fill #0

## 2015-09-16 MED FILL — TRUE METRIX BLOOD GLUCOSE M: W/DEVICE | 365 days supply | Qty: 1 | Fill #0

## 2015-09-16 MED FILL — TRUEplus LANCETS 28G MISC: 33 days supply | Qty: 100 | Fill #0

## 2015-09-16 NOTE — Progress Notes (Signed)
Subjective:  Patient ID: Ashley Ramsey, female    DOB: July 25, 1956  Age: 60 y.o. MRN: [id number]  CC: Hospitalization Follow-up; Establish Care; and Cancer   HPI Ashley Ramsey is a 60 year old female with a history of Type 2 DM (A1c 6.2), HTN diagnosed with laryngeal cancer during a hospitalization at [facility] from 08/29/15 to 09/12/15.  She presented to the ED with worsening hoarseness of voice and abdominal pain. Upon further evaluation with a CT neck she was found to have a laryngeal mass, CT abdomen revealed a left renal mass. She was also found to have UTI with SIRS. She was admitted and started on IV antibiotics, ENT was consulted-patient underwent a biopsy on 1/20 confirming malignancy. Hospital course was complicated by development of upper and  lower GI bleeding and associated blood loss anemia, s/p 5 units PRBC. GI was consulted, endoscopy revealed duodenal ulcer; she also had Gastroduodenal artery embolizatyion by IR.   She underwent tracheostomy on 09/04/2015; PEG tube not placed due to duodenal ulcer.  Seen by Oncology and palliative radiation treatment recommended as she was thought to probably have renal cancer as well. Since discharge she has been to the ED for clogged tracheostomy tube and on another occasion for TPN pump malfunction   She is accompanied by her daughter to the clinic today and has home nursing services. She was referred to Oncology, Urology, ENT but has not been to any of the appointments yet   Outpatient Prescriptions Prior to Visit  Medication Sig Dispense Refill  . acetaminophen (TYLENOL) 325 MG suppository Place 1 suppository (325 mg total) rectally every 4 (four) hours as needed for mild pain. 30 suppository 0  . albuterol (PROVENTIL) (2.5 MG/3ML) 0.083% nebulizer solution Take 3 mLs (2.5 mg total) by nebulization every 6 (six) hours as needed for wheezing or shortness of breath. 75 mL 12  . glucose blood (FREESTYLE LITE) test strip For glucose testing  every before meals at bedtime. Diagnosis E 11.65  Can substitute to any accepted brand 100 each 0  . insulin aspart (NOVOLOG) 100 UNIT/ML injection Before each meal 3 times a day, 140-199 - 2 units, 200-250 - 4 units, 251-299 - 6 units,  300-349 - 8 units,  350 or above 10 units. Dispense syringes and needles as needed, Ok to switch to PEN if approved. Substitute to any brand approved. DX DM2, Code E11.65 1 vial 12  . NONFORMULARY OR COMPOUNDED ITEM TPN with electrolytes and Fat emulsion per [place] via PICC start at 80cc/hr and adjust as needed 1 each 0  . promethazine (PHENERGAN) 25 MG suppository Place 1 suppository (25 mg total) rectally every 6 (six) hours as needed for nausea or vomiting. 30 each 0  . glucose monitoring kit (FREESTYLE) monitoring kit 1 each by Does not apply route 3 (three) times daily before meals. 1 each 0  . glucose monitoring kit (FREESTYLE) monitoring kit 1 each by Does not apply route 4 (four) times daily - after meals and at bedtime. 1 month Diabetic Testing Supplies for QAC-QHS accuchecks.Any brand OK. Diagnosis E11.65 1 each 1  . Vitamin D, Ergocalciferol, (DRISDOL) 50000 UNITS CAPS capsule Take 1 capsule (50,000 Units total) by mouth every 7 (seven) days. 12 capsule 0  . nicotine (NICODERM CQ - DOSED IN MG/24 HOURS) 21 mg/24hr patch Place 1 patch (21 mg total) onto the skin daily. (Patient not taking: Reported on 09/15/2015) 28 patch 0  . scopolamine (TRANSDERM-SCOP) 1 MG/3DAYS Place 1 patch (  1.5 mg total) onto the skin every 3 (three) days. (Patient not taking: Reported on 09/15/2015) 10 patch 0   No facility-administered medications prior to visit.    ROS Review of Systems  Constitutional: Negative for activity change, appetite change and fatigue.  HENT: Positive for trouble swallowing and voice change. Negative for congestion, sinus pressure and sore throat.   Eyes: Negative for visual disturbance.  Respiratory: Negative for cough, chest tightness, shortness of  breath and wheezing.   Cardiovascular: Negative for chest pain and palpitations.  Gastrointestinal: Negative for abdominal pain, constipation and abdominal distention.  Endocrine: Negative for polydipsia.  Genitourinary: Negative for dysuria and frequency.  Musculoskeletal: Negative for back pain and arthralgias.  Skin: Negative for rash.  Neurological: Negative for tremors, light-headedness and numbness.  Hematological: Does not bruise/bleed easily.  Psychiatric/Behavioral: Negative for behavioral problems and agitation.    Objective:  BP 129/85 mmHg  Pulse 108  Temp(Src) 98.6 F (37 C) (Oral)  Resp 16  Ht '5\' 7"'$  (1.702 m)  Wt 205 lb (92.987 kg)  BMI 32.10 kg/m2  SpO2 96%  BP/Weight 09/16/2015 09/20/3149 02/15/1606  Systolic BP 371 062 694  Diastolic BP 85 60 65  Wt. (Lbs) 205 - -  BMI 32.1 - -      Physical Exam  Constitutional: She is oriented to person, place, and time. She appears well-developed and well-nourished.  Neck:  Tracheostomy tube in place  Cardiovascular: Normal rate, normal heart sounds and intact distal pulses.   No murmur heard. Pulmonary/Chest: Effort normal and breath sounds normal. She has no wheezes. She has no rales. She exhibits no tenderness.  Abdominal: Soft. Bowel sounds are normal. She exhibits no distension and no mass. There is no tenderness.  Musculoskeletal: Normal range of motion.  Neurological: She is alert and oriented to person, place, and time.  Skin:  Right PICC line in right arm, no sign of infection     Assessment & Plan:   1. Type 2 diabetes mellitus without complication, without long-term current use of insulin (HCC) Controlled with A1c of 6.2 She has been taking 10 units of Novolog daily; she has been instructed to administer according to her sliding scale as indicated on her med list - Glucose (CBG) - HgB A1c - TRUEPLUS LANCETS 28G MISC; 1 each by Does not apply route 3 (three) times daily before meals.  Dispense: 100 each;  Refill: 12 - Blood Glucose Monitoring Suppl (TRUE METRIX METER) DEVI; 1 each by Does not apply route 3 (three) times daily before meals.  Dispense: 1 Device; Refill: 0 - glucose blood (TRUE METRIX BLOOD GLUCOSE TEST) test strip; Use 3 times daily before meals  Dispense: 100 each; Refill: 12  2. Essential hypertension, benign Controlled  3. Laryngeal cancer [facility]) Patient is scheduled to see Oncology soon Trach care as per schedule  4. Dysphagia Patient is currently receiving TPN  5. Renal mass Patient scheduled to see Urology  6. Vitamin D deficiency Will send off vit D levels - Cholecalciferol (VITAMIN D3) 1200 UNIT/15ML LIQD; Take 15 mLs by mouth daily.  Dispense: 450 mL; Refill: 2  7. Duodenal ulcer Asymptomatic at this time Will consider PPI at next visit   Meds ordered this encounter  Medications  . TRUEPLUS LANCETS 28G MISC    Sig: 1 each by Does not apply route 3 (three) times daily before meals.    Dispense:  100 each    Refill:  12  . Blood Glucose Monitoring Suppl (TRUE METRIX METER)  DEVI    Sig: 1 each by Does not apply route 3 (three) times daily before meals.    Dispense:  1 Device    Refill:  0  . glucose blood (TRUE METRIX BLOOD GLUCOSE TEST) test strip    Sig: Use 3 times daily before meals    Dispense:  100 each    Refill:  12  . Cholecalciferol (VITAMIN D3) 1200 UNIT/15ML LIQD    Sig: Take 15 mLs by mouth daily.    Dispense:  450 mL    Refill:  2    Follow-up: Return in about 1 month (around 10/14/2015) for follow up of Diabetes Mellitus.   Arnoldo Morale MD

## 2015-09-16 NOTE — Progress Notes (Signed)
[facility] f/up laryngeal cancer.  Patient denies pain today  Patient requesting refill of vitamin D, Glucose Kit.  Patient states having the flu vaccine.

## 2015-09-16 NOTE — Patient Instructions (Signed)
Diabetes Mellitus and Food It is important for you to manage your blood sugar (glucose) level. Your blood glucose level can be greatly affected by what you eat. Eating healthier foods in the appropriate amounts throughout the day at about the same time each day will help you control your blood glucose level. It can also help slow or prevent worsening of your diabetes mellitus. Healthy eating may even help you improve the level of your blood pressure and reach or maintain a healthy weight.  General recommendations for healthful eating and cooking habits include:  Eating meals and snacks regularly. Avoid going long periods of time without eating to lose weight.  Eating a diet that consists mainly of plant-based foods, such as fruits, vegetables, nuts, legumes, and whole grains.  Using low-heat cooking methods, such as baking, instead of high-heat cooking methods, such as deep frying. Work with your dietitian to make sure you understand how to use the Nutrition Facts information on food labels. HOW CAN FOOD AFFECT ME? Carbohydrates Carbohydrates affect your blood glucose level more than any other type of food. Your dietitian will help you determine how many carbohydrates to eat at each meal and teach you how to count carbohydrates. Counting carbohydrates is important to keep your blood glucose at a healthy level, especially if you are using insulin or taking certain medicines for diabetes mellitus. Alcohol Alcohol can cause sudden decreases in blood glucose (hypoglycemia), especially if you use insulin or take certain medicines for diabetes mellitus. Hypoglycemia can be a life-threatening condition. Symptoms of hypoglycemia (sleepiness, dizziness, and disorientation) are similar to symptoms of having too much alcohol.  If your health care provider has given you approval to drink alcohol, do so in moderation and use the following guidelines:  Women should not have more than one drink per day, and men  should not have more than two drinks per day. One drink is equal to:  12 oz of beer.  5 oz of wine.  1 oz of hard liquor.  Do not drink on an empty stomach.  Keep yourself hydrated. Have water, diet soda, or unsweetened iced tea.  Regular soda, juice, and other mixers might contain a lot of carbohydrates and should be counted. WHAT FOODS ARE NOT RECOMMENDED? As you make food choices, it is important to remember that all foods are not the same. Some foods have fewer nutrients per serving than other foods, even though they might have the same number of calories or carbohydrates. It is difficult to get your body what it needs when you eat foods with fewer nutrients. Examples of foods that you should avoid that are high in calories and carbohydrates but low in nutrients include:  Trans fats (most processed foods list trans fats on the Nutrition Facts label).  Regular soda.  Juice.  Candy.  Sweets, such as cake, pie, doughnuts, and cookies.  Fried foods. WHAT FOODS CAN I EAT? Eat nutrient-rich foods, which will nourish your body and keep you healthy. The food you should eat also will depend on several factors, including:  The calories you need.  The medicines you take.  Your weight.  Your blood glucose level.  Your blood pressure level.  Your cholesterol level. You should eat a variety of foods, including:  Protein.  Lean cuts of meat.  Proteins low in saturated fats, such as fish, egg whites, and beans. Avoid processed meats.  Fruits and vegetables.  Fruits and vegetables that may help control blood glucose levels, such as apples, mangoes, and   yams.  Dairy products.  Choose fat-free or low-fat dairy products, such as milk, yogurt, and cheese.  Grains, bread, pasta, and rice.  Choose whole grain products, such as multigrain bread, whole oats, and brown rice. These foods may help control blood pressure.  Fats.  Foods containing healthful fats, such as nuts,  avocado, olive oil, canola oil, and fish. DOES EVERYONE WITH DIABETES MELLITUS HAVE THE SAME MEAL PLAN? Because every person with diabetes mellitus is different, there is not one meal plan that works for everyone. It is very important that you meet with a dietitian who will help you create a meal plan that is just right for you.   This information is not intended to replace advice given to you by your health care provider. Make sure you discuss any questions you have with your health care provider.   Document Released: 04/26/2005 Document Revised: 08/20/2014 Document Reviewed: 06/26/2013 Elsevier Interactive Patient Education 2016 Elsevier Inc.  

## 2015-09-19 ENCOUNTER — Emergency Department (HOSPITAL_COMMUNITY)
Admission: EM | Admit: 2015-09-19 | Discharge: 2015-09-19 | Disposition: A | Discharge status: Home / Self Care | Payer: Medicaid Other | Attending: Emergency Medicine | Admitting: Emergency Medicine

## 2015-09-19 ENCOUNTER — Encounter (HOSPITAL_COMMUNITY): Payer: Self-pay

## 2015-09-19 DIAGNOSIS — I10 Essential (primary) hypertension: Secondary | ICD-10-CM | POA: Insufficient documentation

## 2015-09-19 DIAGNOSIS — E119 Type 2 diabetes mellitus without complications: Secondary | ICD-10-CM | POA: Insufficient documentation

## 2015-09-19 DIAGNOSIS — M545 Low back pain: Secondary | ICD-10-CM | POA: Insufficient documentation

## 2015-09-19 NOTE — ED Notes (Signed)
Pt brought in by son-in-law. Pt reports lower back pain above her rectum. She states she was recently diagnosed with cancer (tumor on kidney) as well as thyroid. She comes today because the back pain is worse and OTC meds are not helping. Pt has trach as well.

## 2015-09-20 ENCOUNTER — Inpatient Hospital Stay: Payer: Self-pay | Admitting: Family Medicine

## 2015-09-23 ENCOUNTER — Emergency Department (HOSPITAL_COMMUNITY): Payer: Medicaid Other

## 2015-09-23 ENCOUNTER — Inpatient Hospital Stay (HOSPITAL_COMMUNITY)
Admission: EM | Admit: 2015-09-23 | Discharge: 2015-10-11 | DRG: 314 | Disposition: A | Discharge status: Home Health | Payer: Medicaid Other | Attending: Internal Medicine | Admitting: Internal Medicine

## 2015-09-23 ENCOUNTER — Telehealth: Payer: Self-pay

## 2015-09-23 ENCOUNTER — Encounter: Payer: Self-pay | Admitting: Family Medicine

## 2015-09-23 ENCOUNTER — Encounter (HOSPITAL_COMMUNITY): Payer: Self-pay | Admitting: *Deleted

## 2015-09-23 ENCOUNTER — Ambulatory Visit (HOSPITAL_BASED_OUTPATIENT_CLINIC_OR_DEPARTMENT_OTHER): Payer: Medicaid Other | Admitting: Family Medicine

## 2015-09-23 VITALS — BP 97/67 | HR 153 | Temp 97.7°F | Resp 20 | Wt 205.0 lb

## 2015-09-23 DIAGNOSIS — B377 Candidal sepsis: Secondary | ICD-10-CM | POA: Diagnosis present

## 2015-09-23 DIAGNOSIS — Y95 Nosocomial condition: Secondary | ICD-10-CM | POA: Diagnosis present

## 2015-09-23 DIAGNOSIS — Z794 Long term (current) use of insulin: Secondary | ICD-10-CM

## 2015-09-23 DIAGNOSIS — Z930 Tracheostomy status: Secondary | ICD-10-CM

## 2015-09-23 DIAGNOSIS — N2889 Other specified disorders of kidney and ureter: Secondary | ICD-10-CM

## 2015-09-23 DIAGNOSIS — Z87891 Personal history of nicotine dependence: Secondary | ICD-10-CM | POA: Diagnosis not present

## 2015-09-23 DIAGNOSIS — R32 Unspecified urinary incontinence: Secondary | ICD-10-CM | POA: Diagnosis present

## 2015-09-23 DIAGNOSIS — Z6834 Body mass index (BMI) 34.0-34.9, adult: Secondary | ICD-10-CM | POA: Diagnosis not present

## 2015-09-23 DIAGNOSIS — I10 Essential (primary) hypertension: Secondary | ICD-10-CM | POA: Diagnosis present

## 2015-09-23 DIAGNOSIS — Z4659 Encounter for fitting and adjustment of other gastrointestinal appliance and device: Secondary | ICD-10-CM

## 2015-09-23 DIAGNOSIS — Z9889 Other specified postprocedural states: Secondary | ICD-10-CM

## 2015-09-23 DIAGNOSIS — L8990 Pressure ulcer of unspecified site, unspecified stage: Secondary | ICD-10-CM | POA: Diagnosis present

## 2015-09-23 DIAGNOSIS — E119 Type 2 diabetes mellitus without complications: Secondary | ICD-10-CM | POA: Insufficient documentation

## 2015-09-23 DIAGNOSIS — A4181 Sepsis due to Enterococcus: Secondary | ICD-10-CM | POA: Diagnosis present

## 2015-09-23 DIAGNOSIS — R6520 Severe sepsis without septic shock: Secondary | ICD-10-CM

## 2015-09-23 DIAGNOSIS — E870 Hyperosmolality and hypernatremia: Secondary | ICD-10-CM | POA: Diagnosis present

## 2015-09-23 DIAGNOSIS — R1313 Dysphagia, pharyngeal phase: Secondary | ICD-10-CM | POA: Diagnosis present

## 2015-09-23 DIAGNOSIS — N179 Acute kidney failure, unspecified: Secondary | ICD-10-CM | POA: Diagnosis present

## 2015-09-23 DIAGNOSIS — E1165 Type 2 diabetes mellitus with hyperglycemia: Secondary | ICD-10-CM | POA: Diagnosis present

## 2015-09-23 DIAGNOSIS — Z8711 Personal history of peptic ulcer disease: Secondary | ICD-10-CM | POA: Diagnosis not present

## 2015-09-23 DIAGNOSIS — D638 Anemia in other chronic diseases classified elsewhere: Secondary | ICD-10-CM | POA: Diagnosis present

## 2015-09-23 DIAGNOSIS — C329 Malignant neoplasm of larynx, unspecified: Secondary | ICD-10-CM | POA: Diagnosis present

## 2015-09-23 DIAGNOSIS — C649 Malignant neoplasm of unspecified kidney, except renal pelvis: Secondary | ICD-10-CM | POA: Diagnosis present

## 2015-09-23 DIAGNOSIS — D500 Iron deficiency anemia secondary to blood loss (chronic): Secondary | ICD-10-CM | POA: Diagnosis present

## 2015-09-23 DIAGNOSIS — Z79899 Other long term (current) drug therapy: Secondary | ICD-10-CM | POA: Insufficient documentation

## 2015-09-23 DIAGNOSIS — T80211A Bloodstream infection due to central venous catheter, initial encounter: Secondary | ICD-10-CM | POA: Diagnosis present

## 2015-09-23 DIAGNOSIS — R0902 Hypoxemia: Secondary | ICD-10-CM | POA: Diagnosis not present

## 2015-09-23 DIAGNOSIS — Z8521 Personal history of malignant neoplasm of larynx: Secondary | ICD-10-CM | POA: Insufficient documentation

## 2015-09-23 DIAGNOSIS — E872 Acidosis: Secondary | ICD-10-CM | POA: Diagnosis present

## 2015-09-23 DIAGNOSIS — K264 Chronic or unspecified duodenal ulcer with hemorrhage: Secondary | ICD-10-CM | POA: Diagnosis present

## 2015-09-23 DIAGNOSIS — R1310 Dysphagia, unspecified: Secondary | ICD-10-CM

## 2015-09-23 DIAGNOSIS — E079 Disorder of thyroid, unspecified: Secondary | ICD-10-CM

## 2015-09-23 DIAGNOSIS — J189 Pneumonia, unspecified organism: Secondary | ICD-10-CM | POA: Diagnosis present

## 2015-09-23 DIAGNOSIS — D72829 Elevated white blood cell count, unspecified: Secondary | ICD-10-CM | POA: Insufficient documentation

## 2015-09-23 DIAGNOSIS — A419 Sepsis, unspecified organism: Secondary | ICD-10-CM

## 2015-09-23 LAB — CBC WITH DIFFERENTIAL/PLATELET
BASOS ABS: 0 10*3/uL (ref 0.0–0.1)
BASOS PCT: 0 % (ref 0–1)
EOS ABS: 0 10*3/uL (ref 0.0–0.7)
EOS PCT: 0 % (ref 0–5)
HCT: 31.1 % — ABNORMAL LOW (ref 36.0–46.0)
HEMOGLOBIN: 9.4 g/dL — AB (ref 12.0–15.0)
Lymphocytes Relative: 6 % — ABNORMAL LOW (ref 12–46)
Lymphs Abs: 1.7 10*3/uL (ref 0.7–4.0)
MCH: 26.7 pg (ref 26.0–34.0)
MCHC: 30.2 g/dL (ref 30.0–36.0)
MCV: 88.4 fL (ref 78.0–100.0)
MONO ABS: 2.2 10*3/uL — AB (ref 0.1–1.0)
MONOS PCT: 8 % (ref 3–12)
MPV: 12.4 fL (ref 8.6–12.4)
Neutro Abs: 24.1 10*3/uL — ABNORMAL HIGH (ref 1.7–7.7)
Neutrophils Relative %: 86 % — ABNORMAL HIGH (ref 43–77)
Platelets: 248 10*3/uL (ref 150–400)
RBC: 3.52 MIL/uL — ABNORMAL LOW (ref 3.87–5.11)
RDW: 14.9 % (ref 11.5–15.5)
WBC: 28 10*3/uL — ABNORMAL HIGH (ref 4.0–10.5)

## 2015-09-23 LAB — COMPREHENSIVE METABOLIC PANEL
ALT: 39 U/L (ref 14–54)
ANION GAP: 18 — AB (ref 5–15)
AST: 31 U/L (ref 15–41)
Albumin: 1.8 g/dL — ABNORMAL LOW (ref 3.5–5.0)
Alkaline Phosphatase: 157 U/L — ABNORMAL HIGH (ref 38–126)
BILIRUBIN TOTAL: 0.6 mg/dL (ref 0.3–1.2)
BUN: 86 mg/dL — AB (ref 6–20)
CHLORIDE: 107 mmol/L (ref 101–111)
CO2: 21 mmol/L — ABNORMAL LOW (ref 22–32)
Calcium: 8.5 mg/dL — ABNORMAL LOW (ref 8.9–10.3)
Creatinine, Ser: 1.83 mg/dL — ABNORMAL HIGH (ref 0.44–1.00)
GFR, EST AFRICAN AMERICAN: 34 mL/min — AB (ref >60)
GFR, EST NON AFRICAN AMERICAN: 29 mL/min — AB (ref >60)
Glucose, Bld: 393 mg/dL — ABNORMAL HIGH (ref 65–99)
POTASSIUM: 4.3 mmol/L (ref 3.5–5.1)
Sodium: 146 mmol/L — ABNORMAL HIGH (ref 135–145)
TOTAL PROTEIN: 6.6 g/dL (ref 6.5–8.1)

## 2015-09-23 LAB — URINALYSIS, ROUTINE W REFLEX MICROSCOPIC
Bilirubin Urine: NEGATIVE
Glucose, UA: 500 mg/dL — AB
HGB URINE DIPSTICK: NEGATIVE
Ketones, ur: NEGATIVE mg/dL
Leukocytes, UA: NEGATIVE
NITRITE: NEGATIVE
PROTEIN: NEGATIVE mg/dL
SPECIFIC GRAVITY, URINE: 1.022 (ref 1.005–1.030)
pH: 5 (ref 5.0–8.0)

## 2015-09-23 LAB — I-STAT CG4 LACTIC ACID, ED: LACTIC ACID, VENOUS: 3.77 mmol/L — AB (ref 0.5–2.0)

## 2015-09-23 LAB — GLUCOSE, POCT (MANUAL RESULT ENTRY): POC GLUCOSE: 505 mg/dL — AB (ref 70–99)

## 2015-09-23 MED ORDER — SODIUM CHLORIDE 0.9 % IV SOLN
1250.0000 mg | INTRAVENOUS | Status: DC
  Filled 2015-09-23: qty 1250

## 2015-09-23 MED ORDER — SODIUM CHLORIDE 0.9 % IV BOLUS (SEPSIS)
500.0000 mL | Freq: Once | INTRAVENOUS | Status: AC
  Administered 2015-09-23: 500 mL via INTRAVENOUS

## 2015-09-23 MED ORDER — INSULIN ASPART 100 UNIT/ML ~~LOC~~ SOLN: 2.0000 [IU] | Freq: Three times a day (TID) | SUBCUTANEOUS | Status: DC

## 2015-09-23 MED ORDER — ALBUTEROL SULFATE (2.5 MG/3ML) 0.083% IN NEBU: 2.5000 mg | INHALATION_SOLUTION | Freq: Four times a day (QID) | RESPIRATORY_TRACT | Status: DC | PRN

## 2015-09-23 MED ORDER — VANCOMYCIN HCL IN DEXTROSE 1-5 GM/200ML-% IV SOLN
1000.0000 mg | Freq: Once | INTRAVENOUS | Status: DC
  Filled 2015-09-23: qty 200

## 2015-09-23 MED ORDER — VANCOMYCIN HCL 10 G IV SOLR
1500.0000 mg | Freq: Once | INTRAVENOUS | Status: AC
  Administered 2015-09-23: 1500 mg via INTRAVENOUS
  Filled 2015-09-23: qty 1500

## 2015-09-23 MED ORDER — VANCOMYCIN HCL 10 G IV SOLR
1250.0000 mg | Freq: Two times a day (BID) | INTRAVENOUS | Status: DC
  Filled 2015-09-23: qty 1250

## 2015-09-23 MED ORDER — ALBUTEROL SULFATE (2.5 MG/3ML) 0.083% IN NEBU
5.0000 mg | INHALATION_SOLUTION | Freq: Once | RESPIRATORY_TRACT | Status: AC
  Administered 2015-09-23: 5 mg via RESPIRATORY_TRACT
  Filled 2015-09-23: qty 6

## 2015-09-23 MED ORDER — SODIUM CHLORIDE 0.9 % IV BOLUS (SEPSIS)
1000.0000 mL | INTRAVENOUS | Status: AC
  Administered 2015-09-23 (×3): 1000 mL via INTRAVENOUS

## 2015-09-23 MED ORDER — INSULIN ASPART 100 UNIT/ML ~~LOC~~ SOLN
0.0000 [IU] | Freq: Three times a day (TID) | SUBCUTANEOUS | Status: DC
  Administered 2015-09-24: 1 [IU] via SUBCUTANEOUS

## 2015-09-23 MED ORDER — ACETAMINOPHEN 325 MG PO TABS: 650.0000 mg | ORAL_TABLET | Freq: Four times a day (QID) | ORAL | Status: DC | PRN

## 2015-09-23 MED ORDER — PIPERACILLIN-TAZOBACTAM 3.375 G IVPB: 3.3750 g | Freq: Three times a day (TID) | INTRAVENOUS | Status: DC

## 2015-09-23 MED ORDER — SODIUM CHLORIDE 0.9 % IV SOLN
INTRAVENOUS | Status: DC
  Administered 2015-09-23: 23:00:00 via INTRAVENOUS

## 2015-09-23 MED ORDER — VANCOMYCIN HCL 10 G IV SOLR
1250.0000 mg | INTRAVENOUS | Status: DC
  Filled 2015-09-23: qty 1250

## 2015-09-23 MED ORDER — INSULIN ASPART 100 UNIT/ML ~~LOC~~ SOLN
0.0000 [IU] | Freq: Every day | SUBCUTANEOUS | Status: DC
  Administered 2015-09-23: 2 [IU] via SUBCUTANEOUS

## 2015-09-23 MED ORDER — PIPERACILLIN-TAZOBACTAM 3.375 G IVPB 30 MIN
3.3750 g | Freq: Once | INTRAVENOUS | Status: AC
  Administered 2015-09-23: 3.375 g via INTRAVENOUS
  Filled 2015-09-23: qty 50

## 2015-09-23 MED ORDER — HEPARIN SODIUM (PORCINE) 5000 UNIT/ML IJ SOLN
5000.0000 [IU] | Freq: Three times a day (TID) | INTRAMUSCULAR | Status: DC
  Administered 2015-09-24 – 2015-09-25 (×6): 5000 [IU] via SUBCUTANEOUS
  Filled 2015-09-23 (×6): qty 1

## 2015-09-23 MED ORDER — IPRATROPIUM BROMIDE 0.02 % IN SOLN
0.5000 mg | Freq: Once | RESPIRATORY_TRACT | Status: AC
  Administered 2015-09-23: 0.5 mg via RESPIRATORY_TRACT

## 2015-09-23 MED ORDER — DEXTROSE 5 % IV SOLN
2.0000 g | Freq: Two times a day (BID) | INTRAVENOUS | Status: DC
  Administered 2015-09-24 (×2): 2 g via INTRAVENOUS
  Filled 2015-09-23 (×4): qty 2

## 2015-09-23 NOTE — Consult Note (Addendum)
Pharmacy Antibiotic Note  Ashley Ramsey is a 60 y.o. female admitted on 09/23/2015 with sepsis.  Pharmacy has been consulted for vanc/zosyn dosing.   Pt present w/ SOB and cough. Pt is tachycardic, tachypinc, and hypotensive, Tmax of 100. Recent admission 08/29/15 which resulted in trach placement.  Plan: Vanc 1500 mg IV x1 then 1250 mg IV q12h Zosyn 3.375 g IV q8h F/u cultures, renal function, VT prn, LOT, clinical progresison  Height: 5\' 3"  (160 cm) Weight: 205 lb (92.987 kg) IBW/kg (Calculated) : 52.4  Temp (24hrs), Avg:98.3 F (36.8 C), Min:97.7 F (36.5 C), Max:98.8 F (37.1 C)  No results for input(s): WBC, CREATININE, LATICACIDVEN, VANCOTROUGH, VANCOPEAK, VANCORANDOM, GENTTROUGH, GENTPEAK, GENTRANDOM, TOBRATROUGH, TOBRAPEAK, TOBRARND, AMIKACINPEAK, AMIKACINTROU, AMIKACIN in the last 168 hours.  Estimated Creatinine Clearance: 73.7 mL/min (by C-G formula based on Cr of 0.89).    No Known Allergies  Antimicrobials this admission: vanc 2/10 >>  zosyn 2/10 >>   Dose adjustments this admission: n/a  Microbiology results: 2/10 BCx:  2/10 UCx:    Thank you for allowing pharmacy to be a part of this patient's care.  Joya San, PharmD Clinical Pharmacy Resident Pager # [phone] 09/23/2015 4:33 PM  Pharmacy Code Sepsis Protocol  Time of code sepsis page: 16:45 []  Antibiotics delivered at 16:37 (abx delivered before code)  Were antibiotics ordered at the time of the code sepsis page? Yes Was it required to contact the physician? [x]  Physician not contacted []  Physician contacted to order antibiotics for code sepsis []  Physician contacted to recommend changing antibiotics  Pharmacy consulted for: vanc/zosyn  Anti-infectives    Start     Dose/Rate Route Frequency Ordered Stop   09/24/15 0500  vancomycin (VANCOCIN) 1,250 mg in sodium chloride 0.9 % 250 mL IVPB     1,250 mg 166.7 mL/hr over 90 Minutes Intravenous Every 12 hours 09/23/15 1641     09/24/15 0100   piperacillin-tazobactam (ZOSYN) IVPB 3.375 g     3.375 g 12.5 mL/hr over 240 Minutes Intravenous Every 8 hours 09/23/15 1641     09/23/15 1645  vancomycin (VANCOCIN) 1,500 mg in sodium chloride 0.9 % 500 mL IVPB     1,500 mg 250 mL/hr over 120 Minutes Intravenous  Once 09/23/15 1630     09/23/15 1630  piperacillin-tazobactam (ZOSYN) IVPB 3.375 g     3.375 g 100 mL/hr over 30 Minutes Intravenous  Once 09/23/15 1623     09/23/15 1630  vancomycin (VANCOCIN) IVPB 1000 mg/200 mL premix  Status:  Discontinued     1,000 mg 200 mL/hr over 60 Minutes Intravenous  Once 09/23/15 1623 09/23/15 1630        Nurse education provided: []  Minutes left to administer antibiotics to achieve 1 hour goal []  Correct order of antibiotic administration [x]  Antibiotic Y-site compatibilities    Joya San, PharmD Clinical Pharmacy Resident Pager # [phone] 09/23/2015 4:47 PM   **Addendum**  Pharmacy consulted to dose cefepime and vancomycin for HCAP. WBC elevated to 28, lactic acid 3.77. SCr has doubled since 09/14/15. Est CrCl ~36 ml/min.  Plan: Discontinue Zosyn Vanc 1250 mg IV q24h Cefepime 2 g IV q12h Monitor renal function, cultures, VT prn, LOT

## 2015-09-23 NOTE — Progress Notes (Signed)
Subjective:    Patient ID: Ashley Ramsey, female    DOB: [birth year], 60 y.o.   MRN: [id number]  HPI 60 year old female with a history of type 2 diabetes mellitus (A1c 6.2), laryngeal cancer status post trach, renal mass suspicious for renal cancer, whom i had seen a week ago for hospital follow-up visit and is yet to be seen by oncology, ENT or urology (as per the patient's daughter she is waiting back hear from oncology and was being turned away by the other specialist because she had no medical coverage).  I had called her into the clinic today after receiving a fax from [facility] indicating a leukocytosis of 21.7 compared to 13.8 from 9 days ago and the patient's daughter had given a history of the patient becoming more incontinent and having to wear a diaper and also becoming more confused. The daughter denies the patient having any fever; she does have a chronic cough.  Attempted to obtain a urine specimen for urinalysis which the patient is unable to provide and her vitals seem to point towards possible sepsis.  Past Medical History  Diagnosis Date  . Thyroid disease   . Hypertension   . Neck mass hospitalized 08/29/2015  . Type II diabetes mellitus ([place])   . Left kidney mass   . Anemia, chronic disease     Past Surgical History  Procedure Laterality Date  . No past surgeries    . Esophagogastroduodenoscopy N/A 09/04/2015    Procedure: ESOPHAGOGASTRODUODENOSCOPY (EGD);  Surgeon: Manus Gunning, MD;  Location: [place];  Service: Gastroenterology;  Laterality: N/A;  . Direct laryngoscopy N/A 09/02/2015    Procedure: DIRECT LARYNGOSCOPY WITH BIOPSY;  Surgeon: Izora Gala, MD;  Location: [place];  Service: ENT;  Laterality: N/A;  . Esophagoscopy N/A 09/02/2015    Procedure: ESOPHAGOSCOPY;  Surgeon: Izora Gala, MD;  Location: [place];  Service: ENT;  Laterality: N/A;  . Tracheostomy tube placement N/A 09/04/2015    Procedure: TRACHEOSTOMY;  Surgeon: Izora Gala, MD;   Location: [place];  Service: ENT;  Laterality: N/A;  . Direct laryngoscopy  09/04/2015    Procedure: DIRECT View LARYNGOSCOPY with cautery of of tumor;  Surgeon: Izora Gala, MD;  Location: [place];  Service: ENT;;    Current Outpatient Prescriptions on File Prior to Visit  Medication Sig Dispense Refill  . acetaminophen (TYLENOL) 325 MG suppository Place 1 suppository (325 mg total) rectally every 4 (four) hours as needed for mild pain. 30 suppository 0  . albuterol (PROVENTIL) (2.5 MG/3ML) 0.083% nebulizer solution Take 3 mLs (2.5 mg total) by nebulization every 6 (six) hours as needed for wheezing or shortness of breath. 75 mL 12  . Blood Glucose Monitoring Suppl (TRUE METRIX METER) DEVI 1 each by Does not apply route 3 (three) times daily before meals. 1 Device 0  . Cholecalciferol (VITAMIN D3) 1200 UNIT/15ML LIQD Take 15 mLs by mouth daily. 450 mL 2  . glucose blood (FREESTYLE LITE) test strip For glucose testing every before meals at bedtime. Diagnosis E 11.65  Can substitute to any accepted brand 100 each 0  . glucose blood (TRUE METRIX BLOOD GLUCOSE TEST) test strip Use 3 times daily before meals 100 each 12  . insulin aspart (NOVOLOG) 100 UNIT/ML injection Before each meal 3 times a day, 140-199 - 2 units, 200-250 - 4 units, 251-299 - 6 units,  300-349 - 8 units,  350 or above 10 units. Dispense syringes and needles as needed, Ok to switch to  PEN if approved. Substitute to any brand approved. DX DM2, Code E11.65 1 vial 12  . nicotine (NICODERM CQ - DOSED IN MG/24 HOURS) 21 mg/24hr patch Place 1 patch (21 mg total) onto the skin daily. 28 patch 0  . NONFORMULARY OR COMPOUNDED ITEM TPN with electrolytes and Fat emulsion per [place] via PICC start at 80cc/hr and adjust as needed 1 each 0  . promethazine (PHENERGAN) 25 MG suppository Place 1 suppository (25 mg total) rectally every 6 (six) hours as needed for nausea or vomiting. 30 each 0  . scopolamine (TRANSDERM-SCOP) 1 MG/3DAYS Place 1  patch (1.5 mg total) onto the skin every 3 (three) days. 10 patch 0  . TRUEPLUS LANCETS 28G MISC 1 each by Does not apply route 3 (three) times daily before meals. 100 each 12   No current facility-administered medications on file prior to visit.      Review of Systems  Constitutional: Negative for activity change and appetite change.  HENT: Negative for sinus pressure and sore throat.   Respiratory: Negative for chest tightness, shortness of breath and wheezing.   Cardiovascular: Negative for chest pain and palpitations.  Gastrointestinal: Negative for abdominal pain, constipation and abdominal distention.  Genitourinary:       Urinary incontinence  Musculoskeletal: Negative.   Psychiatric/Behavioral: Positive for confusion. Negative for behavioral problems and dysphoric mood.       Objective: Filed Vitals:   09/23/15 1504  BP: 97/67  Pulse: 153  Temp: 97.7 F (36.5 C)  Resp: 20  Weight: 205 lb (92.987 kg)  SpO2: 89%      Physical Exam  Constitutional:  Ill-looking  Neck:  Tracheostomy tube in place  Cardiovascular: Normal heart sounds and intact distal pulses.  Tachycardia present.   No murmur heard. Pulmonary/Chest: Effort normal. She has wheezes. She has no rales. She exhibits no tenderness.  Upper airway sounds  Abdominal: Soft. Bowel sounds are normal. She exhibits no distension and no mass. There is no tenderness.  Musculoskeletal: Normal range of motion.  Neurological: She is alert.          Assessment & Plan:  Leukocytosis: -CBC faxed stain from [facility] indicates leukocytosis of 21.7  -Attempted to send off a UA (but patient unable to produce specimen) to exclude UTI in the setting of leukocytosis and patient becoming more incontinent and more confused. -We'll refer to the ED as sepsis cannot be ruled out especially with her being hypotensive, tachycardic and with leukocytosis  Laryngeal cancer s/p trach -Received a nebulizer treatment of  Atrovent. -Yet to see Oncology - Financial assistance staff called in to see the patient to expedite the process - Referral coordinator to help with referral to Oncology ASAP -Yet to see ENT at [facility]  Renal mass -Likely renal cancer. -Yet to see urology   This note has been created with Dragon speech recognition software and Engineer, materials. Any transcriptional errors are unintentional.

## 2015-09-23 NOTE — Progress Notes (Signed)
Patient complains of sob Daughter states she has been more confused lately

## 2015-09-23 NOTE — ED Notes (Signed)
Manual BP: 100/78

## 2015-09-23 NOTE — H&P (Signed)
[facility] History and Physical  DIONI KEDZIERSKI [id number] DOB: 11/13/1955 DOA: 09/23/2015  Referring physician: EDP PCP: Arnoldo Morale, MD   Chief Complaint: Leukocytosis   HPI: Ashley Ramsey is a 60 y.o. female with h/o laryngeal cancer, s/p trach, currently on TPN for nutrition.  All of this occurred during a Jan 16th admission.  Patient apparently has been declining in general since time of discharge but is especially more confused in past couple of days.  WBC today had increased to 28k from 9 days ago.  Patient sent to ED for eval.  Review of Systems: Unable to perform due to AMS.  Past Medical History  Diagnosis Date  . Thyroid disease   . Hypertension   . Neck mass hospitalized 08/29/2015  . Type II diabetes mellitus ([place])   . Left kidney mass   . Anemia, chronic disease    Past Surgical History  Procedure Laterality Date  . No past surgeries    . Esophagogastroduodenoscopy N/A 09/04/2015    Procedure: ESOPHAGOGASTRODUODENOSCOPY (EGD);  Surgeon: Manus Gunning, MD;  Location: [place];  Service: Gastroenterology;  Laterality: N/A;  . Direct laryngoscopy N/A 09/02/2015    Procedure: DIRECT LARYNGOSCOPY WITH BIOPSY;  Surgeon: Izora Gala, MD;  Location: [place];  Service: ENT;  Laterality: N/A;  . Esophagoscopy N/A 09/02/2015    Procedure: ESOPHAGOSCOPY;  Surgeon: Izora Gala, MD;  Location: [place];  Service: ENT;  Laterality: N/A;  . Tracheostomy tube placement N/A 09/04/2015    Procedure: TRACHEOSTOMY;  Surgeon: Izora Gala, MD;  Location: [place];  Service: ENT;  Laterality: N/A;  . Direct laryngoscopy  09/04/2015    Procedure: DIRECT View LARYNGOSCOPY with cautery of of tumor;  Surgeon: Izora Gala, MD;  Location: [place];  Service: ENT;;   Social History:  reports that she quit smoking about 2 months ago. Her smoking use included Cigarettes. She smoked 0.00 packs per day for 34 years. She has never used smokeless tobacco. She reports that she does not drink  alcohol or use illicit drugs.  No Known Allergies  Family History  Problem Relation Age of Onset  . Diabetes Mother   . Hypertension Mother   . Heart disease Mother   . Diabetes Sister   . Hypertension Sister   . Cancer Daughter     Stomach     Prior to Admission medications   Medication Sig Start Date End Date Taking? Authorizing Provider  acetaminophen (TYLENOL) 325 MG suppository Place 1 suppository (325 mg total) rectally every 4 (four) hours as needed for mild pain. Patient taking differently: Place 325 mg rectally every 4 (four) hours as needed for mild pain (nausea).  09/12/15  Yes Thurnell Lose, MD  albuterol (PROVENTIL) (2.5 MG/3ML) 0.083% nebulizer solution Take 3 mLs (2.5 mg total) by nebulization every 6 (six) hours as needed for wheezing or shortness of breath. 09/12/15  Yes Thurnell Lose, MD  insulin aspart (NOVOLOG) 100 UNIT/ML injection Before each meal 3 times a day, 140-199 - 2 units, 200-250 - 4 units, 251-299 - 6 units,  300-349 - 8 units,  350 or above 10 units. Dispense syringes and needles as needed, Ok to switch to PEN if approved. Substitute to any brand approved. DX DM2, Code E11.65 Patient taking differently: Inject 2-10 Units into the skin 3 (three) times daily with meals. Before each meal 3 times a day per sliding scale: CBG 140-199 - 2 units, 200-250 - 4 units, 251-299 - 6 units,  300-349 -  8 units,  350 or above 10 units. Dispense syringes and needles as needed, Ok to switch to PEN if approved. Substitute to any brand approved. DX DM2, Code E11.65 09/12/15  Yes Thurnell Lose, MD  NONFORMULARY OR COMPOUNDED ITEM TPN with electrolytes and Fat emulsion per [place] via PICC start at 80cc/hr and adjust as needed 09/12/15  Yes Thurnell Lose, MD  Blood Glucose Monitoring Suppl (TRUE METRIX METER) DEVI 1 each by Does not apply route 3 (three) times daily before meals. 09/16/15   Arnoldo Morale, MD  glucose blood (FREESTYLE LITE) test strip For glucose testing  every before meals at bedtime. Diagnosis E 11.65  Can substitute to any accepted brand 09/12/15   Thurnell Lose, MD  glucose blood (TRUE METRIX BLOOD GLUCOSE TEST) test strip Use 3 times daily before meals 09/16/15   Arnoldo Morale, MD  TRUEPLUS LANCETS 28G MISC 1 each by Does not apply route 3 (three) times daily before meals. 09/16/15   Arnoldo Morale, MD   Physical Exam: Filed Vitals:   09/23/15 2030 09/23/15 2045  BP: 108/76 110/74  Pulse: 116 117  Temp:    Resp: 24 24    BP 110/74 mmHg  Pulse 117  Temp(Src) 100 F (37.8 C) (Rectal)  Resp 24  Ht 5\' 3"  (1.6 m)  Wt 92.987 kg (205 lb)  BMI 36.32 kg/m2  SpO2 99%  General Appearance:    Lethargic, awakens but falls back asleep quickly. no distress, appears stated age  Head:    Normocephalic, atraumatic  Eyes:    PERRL, EOMI, sclera non-icteric        Nose:   Nares without drainage or epistaxis. Mucosa, turbinates normal  Throat:   Moist mucous membranes. Oropharynx without erythema or exudate.  Neck:   Supple. No carotid bruits.  No thyromegaly.  No lymphadenopathy.  Tracheostomy in place with greenish secretions.  Back:     No CVA tenderness, no spinal tenderness  Lungs:     Diffuse coarse rhonchi  Chest wall:    No tenderness to palpitation  Heart:   Tachycardic  Abdomen:     Soft, non-tender, nondistended, normal bowel sounds, no organomegaly  Genitalia:    deferred  Rectal:    deferred  Extremities:   No clubbing, cyanosis or edema.  Pulses:   2+ and symmetric all extremities  Skin:   Skin color, texture, turgor normal, no rashes or lesions, PICC in RUE, non tender, no surrounding erythema  Lymph nodes:   Cervical, supraclavicular, and axillary nodes normal  Neurologic:   CNII-XII intact. Normal strength, sensation and reflexes      throughout    Labs on Admission:  Basic Metabolic Panel:  Recent Labs Lab 09/23/15 1812  NA 146*  K 4.3  CL 107  CO2 21*  GLUCOSE 393*  BUN 86*  CREATININE 1.83*  CALCIUM 8.5*    Liver Function Tests:  Recent Labs Lab 09/23/15 1812  AST 31  ALT 39  ALKPHOS 157*  BILITOT 0.6  PROT 6.6  ALBUMIN 1.8*   No results for input(s): LIPASE, AMYLASE in the last 168 hours. No results for input(s): AMMONIA in the last 168 hours. CBC:  Recent Labs Lab 09/23/15 1422  WBC 28.0*  NEUTROABS 24.1*  HGB 9.4*  HCT 31.1*  MCV 88.4  PLT 248   Cardiac Enzymes: No results for input(s): CKTOTAL, CKMB, CKMBINDEX, TROPONINI in the last 168 hours.  BNP (last 3 results) No results for input(s): PROBNP in  the last 8760 hours. CBG: No results for input(s): GLUCAP in the last 168 hours.  Radiological Exams on Admission: Dg Chest Port 1 View  09/23/2015  CLINICAL DATA:  60 year old female with shortness of breath and cough EXAM: PORTABLE CHEST 1 VIEW COMPARISON:  Radiograph dated 09/14/2015 FINDINGS: Right-sided PICC in stable positioning. Tracheostomy with tip above the carina in stable positioning. Single-view of the chest demonstrate a focal area of hazy density at the left lung base, likely atelectatic changes. Pneumonia is less likely but not excluded. Clinical correlation is recommended. The lungs are hypovolemic. No pleural effusion or pneumothorax. Stable cardiac silhouette. No acute osseous pathology. IMPRESSION: Minimal left lung base atelectatic changes versus less likely pneumonia. Clinical correlation recommended. Electronically Signed   By: Anner Crete M.D.   On: 09/23/2015 18:30    EKG: Independently reviewed.  Assessment/Plan Principal Problem:   Severe sepsis (HCC) Active Problems:   Type 2 diabetes mellitus without complication, without long-term current use of insulin (HCC)   AKI (acute kidney injury) ([place])   HCAP (healthcare-associated pneumonia)   DM2 (diabetes mellitus, type 2) ([place])   1. Severe sepsis possibly due to LLL HCAP - 1. Cefepime and vanc empirically 2. PNA pathway 3. Sputum and BCx pending 4. Alternative possible infectious  source includes the PICC line which is being used for TPN 5. IVF: 1. 3L bolus in ED 2. Giving another 500cc bolus now 3. 125 cc/hr 6. Repeat CBC in AM to trend leukocytosis 7. Tylenol PRN fever 8. Initial lactate was 3.77, this went down to 1.65 per EDP on repeat after IVF 2. AKI - pre-renal due to #1 1. Hydrating with IVF 2. Repeat BMP in AM 3. DM2 - 1. 2 units AC 2. Sensitive scale SSI AC/HS    Code Status: Full Family Communication: No family in room Disposition Plan: Admit to SDU  Time spent: 70 min  GARDNER, JARED M. [facility] Pager [phone]  If 7AM-7PM, please contact the day team taking care of the patient [email or web address] Password [facility] 09/23/2015, 9:24 PM

## 2015-09-23 NOTE — Progress Notes (Signed)
[facility]  Patient Status:  Active pt with [facility] prior to this readmission   [facility] is providing the following services: HHRN and TNA  Pharmacy Team for home TNA. See [facility] TNA formula below.     If patient discharges after hours, please call [phone].   [name] 09/23/2015, 10:40 PM

## 2015-09-23 NOTE — Telephone Encounter (Signed)
This Case Manager spoke with Dr. Jarold Song who indicated she received labwork from [facility] [place] which showed patient with leukocytosis. Dr. Jarold Song asked this Case Manager to call patient to determine if patient experiencing any signs of infection. This Case Manager placed call to [phone] to speak with patient.  Patient unable to speak due to trach. Spoke with patient's caregiver and daughter who indicated patient afebrile and not having chills. Patient's daughter does indicate she had to put patient in an adult brief because patient has begun wetting on herself which is new for patient. Patient's daughter also indicated she had been unable to get patient an Oncology appointment. She indicated she was still waiting to hear back from Oncology office.   Spoke with Dr. Jarold Song about conversation with patient's daughter. Dr. Jarold Song indicated she wanted to see patient in office today for urinalysis. Patient's daughter informed, and she indicated she would bring patient to clinic at 1400. Dr. Jarold Song updated.

## 2015-09-23 NOTE — ED Notes (Signed)
Unable to obtain second set of cultures. Extremely difficult stick.

## 2015-09-23 NOTE — ED Provider Notes (Signed)
CSN: [id number]     Arrival date & time 09/23/15  1600 History   First MD Initiated Contact with Patient 09/23/15 1603     Chief Complaint  Patient presents with  . Shortness of Breath     (Consider location/radiation/quality/duration/timing/severity/associated sxs/prior Treatment) Patient is a 60 y.o. female presenting with shortness of breath.  Shortness of Breath  Level V caveat unstable vital signs, urgency of situation. Patient brought here by EMS from [facility] after she was called there earlier today when home health nurse got report that patient had leukocytosis of 21,000 from 13,009 days ago. Family member had reported to staff at [facility] the patient more confused and incontinent in recent days. Patient presently denies complaint. He denies shortness of breath. She was treated by EMS with supplemental oxygen, nonrebreather mask over her trach collar. Past Medical History  Diagnosis Date  . Thyroid disease   . Hypertension   . Neck mass hospitalized 08/29/2015  . Type II diabetes mellitus ([place])   . Left kidney mass   . Anemia, chronic disease    Past Surgical History  Procedure Laterality Date  . No past surgeries    . Esophagogastroduodenoscopy N/A 09/04/2015    Procedure: ESOPHAGOGASTRODUODENOSCOPY (EGD);  Surgeon: Manus Gunning, MD;  Location: [place];  Service: Gastroenterology;  Laterality: N/A;  . Direct laryngoscopy N/A 09/02/2015    Procedure: DIRECT LARYNGOSCOPY WITH BIOPSY;  Surgeon: Izora Gala, MD;  Location: [place];  Service: ENT;  Laterality: N/A;  . Esophagoscopy N/A 09/02/2015    Procedure: ESOPHAGOSCOPY;  Surgeon: Izora Gala, MD;  Location: [place];  Service: ENT;  Laterality: N/A;  . Tracheostomy tube placement N/A 09/04/2015    Procedure: TRACHEOSTOMY;  Surgeon: Izora Gala, MD;  Location: [place];  Service: ENT;  Laterality: N/A;  . Direct laryngoscopy  09/04/2015    Procedure: DIRECT View LARYNGOSCOPY  with cautery of of tumor;  Surgeon: Izora Gala, MD;  Location: [facility] OR;  Service: ENT;;   Family History  Problem Relation Age of Onset  . Diabetes Mother   . Hypertension Mother   . Heart disease Mother   . Diabetes Sister   . Hypertension Sister   . Cancer Daughter     Stomach   Social History  Substance Use Topics  . Smoking status: Former Smoker -- 0.00 packs/day for 34 years    Types: Cigarettes    Quit date: 07/14/2015  . Smokeless tobacco: Never Used  . Alcohol Use: No     Comment: 08/29/2015 "I drink 1/5th liquor one 1 day/week"   OB History    No data available     Review of Systems  Unable to perform ROS: Acuity of condition  Respiratory: Positive for shortness of breath.   Allergic/Immunologic: Positive for immunocompromised state.       Cancer      Allergies  Review of patient's allergies indicates no known allergies.  Home Medications   Prior to Admission medications   Medication Sig Start Date End Date Taking? Authorizing Provider  acetaminophen (TYLENOL) 325 MG suppository Place 1 suppository (325 mg total) rectally every 4 (four) hours as needed for mild pain. 09/12/15   Thurnell Lose, MD  albuterol (PROVENTIL) (2.5 MG/3ML) 0.083% nebulizer solution Take 3 mLs (2.5 mg total) by nebulization every 6 (six) hours as needed for wheezing or shortness of breath. 09/12/15   Thurnell Lose, MD  Blood Glucose Monitoring Suppl (TRUE METRIX METER) DEVI 1 each  by Does not apply route 3 (three) times daily before meals. 09/16/15   Arnoldo Morale, MD  Cholecalciferol (VITAMIN D3) 1200 UNIT/15ML LIQD Take 15 mLs by mouth daily. 09/16/15   Arnoldo Morale, MD  glucose blood (FREESTYLE LITE) test strip For glucose testing every before meals at bedtime. Diagnosis E 11.65  Can substitute to any accepted brand 09/12/15   Thurnell Lose, MD  glucose blood (TRUE METRIX BLOOD GLUCOSE TEST) test strip Use 3 times daily before meals 09/16/15   Arnoldo Morale, MD  insulin aspart (NOVOLOG)  100 UNIT/ML injection Before each meal 3 times a day, 140-199 - 2 units, 200-250 - 4 units, 251-299 - 6 units,  300-349 - 8 units,  350 or above 10 units. Dispense syringes and needles as needed, Ok to switch to PEN if approved. Substitute to any brand approved. DX DM2, Code E11.65 09/12/15   Thurnell Lose, MD  nicotine (NICODERM CQ - DOSED IN MG/24 HOURS) 21 mg/24hr patch Place 1 patch (21 mg total) onto the skin daily. 09/12/15   Thurnell Lose, MD  NONFORMULARY OR COMPOUNDED ITEM TPN with electrolytes and Fat emulsion per [place] via PICC start at 80cc/hr and adjust as needed 09/12/15   Thurnell Lose, MD  promethazine (PHENERGAN) 25 MG suppository Place 1 suppository (25 mg total) rectally every 6 (six) hours as needed for nausea or vomiting. 09/12/15   Thurnell Lose, MD  scopolamine (TRANSDERM-SCOP) 1 MG/3DAYS Place 1 patch (1.5 mg total) onto the skin every 3 (three) days. 09/12/15   Thurnell Lose, MD  TRUEPLUS LANCETS 28G MISC 1 each by Does not apply route 3 (three) times daily before meals. 09/16/15   Arnoldo Morale, MD   BP 107/54 mmHg  Pulse 147  Temp(Src) 98.8 F (37.1 C) (Oral)  Ht '5\' 3"'$  (1.6 m)  Wt 205 lb (92.987 kg)  BMI 36.32 kg/m2  SpO2 97% Physical Exam  Constitutional:  Chronically ill-appearing  HENT:  Head: Normocephalic and atraumatic.  Eyes: Conjunctivae are normal. Pupils are equal, round, and reactive to light.  Neck: Neck supple. No tracheal deviation present. No thyromegaly present.  Tracheostomy in place. Green secretions coming from tracheostomy  Cardiovascular: Regular rhythm.   No murmur heard. Tachycardic  Pulmonary/Chest: Effort normal.  Coarse rhonchi diffusely  Abdominal: Soft. Bowel sounds are normal. She exhibits no distension. There is no tenderness.  Musculoskeletal: Normal range of motion. She exhibits no edema or tenderness.  PICC line in place at right upper extremity. No redness or tenderness at site  Neurological: She is alert. No  cranial nerve deficit. Coordination normal.  Result extremities follow simple commands  Skin: Skin is warm and dry. No rash noted.  Psychiatric: She has a normal mood and affect.  Nursing note and vitals reviewed.   ED Course  Procedures (including critical care time) Labs Review Labs Reviewed  CULTURE, BLOOD (ROUTINE X 2)  CULTURE, BLOOD (ROUTINE X 2)  URINE CULTURE  COMPREHENSIVE METABOLIC PANEL  CBC WITH DIFFERENTIAL/PLATELET  URINALYSIS, ROUTINE W REFLEX MICROSCOPIC (NOT AT [facility])  I-STAT CG4 LACTIC ACID, ED    Imaging Review No results found. I have personally reviewed and evaluated these images and lab results as part of my medical decision-making.   EKG Interpretation   Date/Time:  Friday September 23 2015 16:10:17 EST Ventricular Rate:  147 PR Interval:    QRS Duration: 99 QT Interval:  283 QTC Calculation: 442 R Axis:   -50 Text Interpretation:  Atrial flutter with  predominant 2:1 AV block  Ventricular bigeminy Left anterior fascicular block Anteroseptal infarct,  old No significant change since last tracing Confirmed by Winfred Leeds  MD,  Weston Fulco [phone]) on 09/23/2015 4:15:51 PM     5:15 PM several family members including daughters and caretaker and son-in-law arrive to report that patient has been more confused over the past week. She has not had a fever. She's been incontinent of urine over the past week.  Tracheostomy tube was suctioned by respiratory therapist. After suctioning patient remained with rhonchorous respirations Chest x-ray viewed by me  8:45 PM patient resting comfortably after treatment with intravenous fluids, and antibiotics. Results for orders placed or performed during the hospital encounter of 09/23/15  Comprehensive metabolic panel  Result Value Ref Range   Sodium 146 (H) 135 - 145 mmol/L   Potassium 4.3 3.5 - 5.1 mmol/L   Chloride 107 101 - 111 mmol/L   CO2 21 (L) 22 - 32 mmol/L   Glucose, Bld 393 (H) 65 - 99 mg/dL   BUN 86 (H) 6 - 20  mg/dL   Creatinine, Ser 1.83 (H) 0.44 - 1.00 mg/dL   Calcium 8.5 (L) 8.9 - 10.3 mg/dL   Total Protein 6.6 6.5 - 8.1 g/dL   Albumin 1.8 (L) 3.5 - 5.0 g/dL   AST 31 15 - 41 U/L   ALT 39 14 - 54 U/L   Alkaline Phosphatase 157 (H) 38 - 126 U/L   Total Bilirubin 0.6 0.3 - 1.2 mg/dL   GFR calc non Af Amer 29 (L) >60 mL/min   GFR calc Af Amer 34 (L) >60 mL/min   Anion gap 18 (H) 5 - 15  Urinalysis, Routine w reflex microscopic (not at [facility])  Result Value Ref Range   Color, Urine YELLOW YELLOW   APPearance CLOUDY (A) CLEAR   Specific Gravity, Urine 1.022 1.005 - 1.030   pH 5.0 5.0 - 8.0   Glucose, UA 500 (A) NEGATIVE mg/dL   Hgb urine dipstick NEGATIVE NEGATIVE   Bilirubin Urine NEGATIVE NEGATIVE   Ketones, ur NEGATIVE NEGATIVE mg/dL   Protein, ur NEGATIVE NEGATIVE mg/dL   Nitrite NEGATIVE NEGATIVE   Leukocytes, UA NEGATIVE NEGATIVE  I-Stat CG4 Lactic Acid, ED  (not at  [facility])  Result Value Ref Range   Lactic Acid, Venous 3.77 (HH) 0.5 - 2.0 mmol/L   Comment NOTIFIED PHYSICIAN    Dg Chest 2 View  09/12/2015  CLINICAL DATA:  Tracheostomy, cough, shortness of breath EXAM: CHEST  2 VIEW COMPARISON:  CT chest 08/29/2015 FINDINGS: There is a tracheostomy tube in satisfactory position. There is no focal parenchymal opacity. There is no pleural effusion or pneumothorax. The heart and mediastinal contours are unremarkable. The osseous structures are unremarkable. IMPRESSION: No active cardiopulmonary disease. Electronically Signed   By: Kathreen Devoid   On: 09/12/2015 09:07   Dg Chest 2 View  08/29/2015  CLINICAL DATA:  One month history of chest pain and hoarse voice. EXAM: CHEST  2 VIEW COMPARISON:  None. FINDINGS: The heart is borderline and enlarged. Mild tortuosity of the thoracic aorta. Low lung volumes with mild vascular crowding and streaky basilar atelectasis. There also bronchitic changes which could be acute or chronic. No infiltrates or effusions. The bony thorax is intact. IMPRESSION:  Acute versus chronic bronchitic change.  No infiltrates or effusions Electronically Signed   By: Marijo Sanes M.D.   On: 08/29/2015 16:15   Ct Soft Tissue Neck W Contrast  08/29/2015  CLINICAL DATA:  60 year old female  with abnormal full waist for 1 month. Abdominal pain. Three days of vomiting. Initial encounter. EXAM: CT NECK WITH CONTRAST TECHNIQUE: Multidetector CT imaging of the neck was performed using the standard protocol following the bolus administration of intravenous contrast. CONTRAST:  34m OMNIPAQUE IOHEXOL 300 MG/ML SOLN in conjunction with contrast enhanced imaging of the chest, abdomen, and pelvis reported separately. COMPARISON:  CT chest abdomen and pelvis from today reported separately FINDINGS: Pharynx and larynx: Bulky supraglottic laryngeal tumor with marked soft tissue enlargement of the bilateral aryepiglottic folds and anterior commissure up to 17 mm in thickness. Tumor appears inseparable from the undersurface of the strap muscles suggesting extension through the thyroid cartilage bilaterally. Posterior hypopharynx involvement. The epiglottis is thickened, nodular, and distorted. Furthermore, the vallecula is mostly effaced and soft tissue at the base of tongue appears nodular and thickened. All told, tumor encompasses 37 x 40 x 58 mm (AP by transverse by CC). The other laryngeal cartilages appear spared. The palatine tonsils, soft palate, and nasopharynx appear within normal limits. Superior parapharyngeal spaces and retropharyngeal space are within normal limits. Salivary glands: Sublingual space, submandibular glands, and parotid glands are within normal limits. Thyroid: Coarsely calcified 2.3 cm right thyroid nodule. Subcentimeter left lobe nodule. Lymph nodes: Abnormal right level 3 node measuring 9 mm at the level of the thyroid cartilage (series 1, image 57). Small but asymmetric 5 mm right level IIIa lymph node at the level of the hyoid bone on the right (series 1, image 49).  Bilateral level 2A nodes appear symmetric measuring 8-9 mm in thickness. No level 4, 5, or level 1 lymphadenopathy. Vascular: Suboptimal intravascular contrast timing, but major vascular structures in the neck and at the skullbase appear to remain patent. Left greater than right carotid bifurcation calcified atherosclerosis. Limited intracranial: Negative.  Partially empty sella. Visualized orbits: Negative. Mastoids and visualized paranasal sinuses: Visualized paranasal sinuses and mastoids are clear. Skeleton: Mostly absent dentition. Periapical lucency about the residual left mandible molar. No osseous metastatic disease identified. Upper chest: Reported separately today. IMPRESSION: 1. 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 2. Suspect metastatic nodal disease at level 3 on the right (small but asymmetric up to 9 mm nodes). Bilateral level 2 nodes are indeterminate and also measure up to 9 mm individually. No cystic or necrotic nodes in the neck. 3. See CT chest abdomen and pelvis from today reported separately. Electronically Signed   By: HGenevie AnnM.D.   On: 08/29/2015 19:56   Ct Chest W Contrast  08/29/2015  CLINICAL DATA:  60year old female with 2-3 day history of vomiting. Abdominal pain for the past month. Loss of voice. EXAM: CT CHEST, ABDOMEN, AND PELVIS WITH CONTRAST TECHNIQUE: Multidetector CT imaging of the chest, abdomen and pelvis was performed following the standard protocol during bolus administration of intravenous contrast. CONTRAST:  718mOMNIPAQUE IOHEXOL 300 MG/ML  SOLN COMPARISON:  No priors. FINDINGS: CT CHEST FINDINGS Mediastinum/Lymph Nodes: Heart size is normal. There is no significant pericardial fluid, thickening or pericardial calcification. There is atherosclerosis of the thoracic aorta, the great vessels of the mediastinum and the coronary arteries, including calcified atherosclerotic  plaque in the left main, left anterior descending, left circumflex and right coronary arteries. Calcifications of the aortic valve and mitral valve/annulus. No pathologically enlarged mediastinal or hilar lymph nodes. Esophagus is unremarkable in appearance. No axillary lymphadenopathy. Lungs/Pleura: 4 mm subpleural nodule in the lateral segment of the right  middle lobe (image 31 of series 4). 4 mm subpleural nodule in the posterior left lower lobe (image 38 of series 4). No other suspicious appearing pulmonary nodules or masses. No acute consolidative airspace disease. No pleural effusions. Musculoskeletal/Soft Tissues: There are no aggressive appearing lytic or blastic lesions noted in the visualized portions of the skeleton. CT ABDOMEN AND PELVIS FINDINGS Hepatobiliary: No cystic or solid hepatic lesions. No intra or extrahepatic biliary ductal dilatation. 7 mm calcified gallstone lying dependently in the gallbladder. No current findings to suggest an acute cholecystitis at this time. Pancreas: No pancreatic mass. No pancreatic ductal dilatation. No pancreatic or peripancreatic fluid or inflammatory changes. Spleen: Unremarkable. Adrenals/Urinary Tract: In the lower pole of the left kidney there is a 5.9 x 6.1 x 6.3 cm heterogeneously enhancing lesion highly concerning for renal cell carcinoma. The inferior aspect of this lesion comes in very close proximity to the anterior aspect of the left psoas muscle and quadratus lumborum muscle, however, there appears to be an intervening fat plane at this time. The lesion is well separated from the left renal vein. Right kidney and right adrenal gland are normal in appearance. 1.2 x 1.4 cm indeterminate nodule in the lateral limb of the left adrenal gland. No hydroureteronephrosis. Urinary bladder is largely decompressed, but otherwise unremarkable in appearance. Stomach/Bowel: Normal appearance of the stomach. No pathologic dilatation of small bowel or colon. A few  scattered colonic diverticulae are noted, without surrounding inflammatory changes to suggest an acute diverticulitis at this time. Appendix is normal. Vascular/Lymphatic: Atherosclerosis throughout the abdominal and pelvic vasculature, without evidence of aneurysm or dissection. Left renal vein is widely patent and separate from the lower pole mass. No lymphadenopathy noted in the abdomen or pelvis. Reproductive: Uterus and ovaries are unremarkable in appearance. Other: No significant volume of ascites.  No pneumoperitoneum. Musculoskeletal: There are no aggressive appearing lytic or blastic lesions noted in the visualized portions of the skeleton. IMPRESSION: 1. No acute findings in the abdomen or pelvis to account for the patient's symptoms. 2. However, there is a 5.9 x 6.1 x 6.3 cm heterogeneously enhancing mass in the lower pole of the left kidney highly concerning for renal cell carcinoma. At this time, the lesion appears likely to be encapsulated within Gerota's fascia (although it comes very close to the left psoas and quadratus lumborum musculature), does not involve the left renal vein, and does not appear to be associated with lymphadenopathy. Nonemergent Urologic consultation for surgical resection is strongly recommended in the near future. 3. 1.2 x 1.4 cm indeterminate nodule in the left adrenal gland. Attention on followup studies is recommended, as a metastatic lesion is not excluded. 4. 4 mm subpleural nodules in the right middle lobe and left lower lobe. These are highly nonspecific, and favored to represent subpleural lymph nodes, but attention on followup studies is recommended to ensure stability. 5. Cholelithiasis without evidence of acute cholecystitis at this time. 6. Atherosclerosis, including left main and 3 vessel coronary artery disease. Please note that although the presence of coronary artery calcium documents the presence of coronary artery disease, the severity of this disease and any  potential stenosis cannot be assessed on this non-gated CT examination. Assessment for potential risk factor modification, dietary therapy or pharmacologic therapy may be warranted, if clinically indicated. 7. There are calcifications of the aortic valve and mitral valve/annulus. Echocardiographic correlation for evaluation of potential valvular dysfunction may be warranted if clinically indicated. 8. Colonic diverticulosis without evidence of acute diverticulitis at this time.  9. Additional incidental findings, as above. These results were called by telephone at the time of interpretation on 08/29/2015 at 7:34 pm to Dr. Davonna Belling, who verbally acknowledged these results. Electronically Signed   By: Vinnie Langton M.D.   On: 08/29/2015 19:37   Ct Abdomen Pelvis W Contrast  08/29/2015  CLINICAL DATA:  60 year old female with 2-3 day history of vomiting. Abdominal pain for the past month. Loss of voice. EXAM: CT CHEST, ABDOMEN, AND PELVIS WITH CONTRAST TECHNIQUE: Multidetector CT imaging of the chest, abdomen and pelvis was performed following the standard protocol during bolus administration of intravenous contrast. CONTRAST:  2m OMNIPAQUE IOHEXOL 300 MG/ML  SOLN COMPARISON:  No priors. FINDINGS: CT CHEST FINDINGS Mediastinum/Lymph Nodes: Heart size is normal. There is no significant pericardial fluid, thickening or pericardial calcification. There is atherosclerosis of the thoracic aorta, the great vessels of the mediastinum and the coronary arteries, including calcified atherosclerotic plaque in the left main, left anterior descending, left circumflex and right coronary arteries. Calcifications of the aortic valve and mitral valve/annulus. No pathologically enlarged mediastinal or hilar lymph nodes. Esophagus is unremarkable in appearance. No axillary lymphadenopathy. Lungs/Pleura: 4 mm subpleural nodule in the lateral segment of the right middle lobe (image 31 of series 4). 4 mm subpleural nodule in  the posterior left lower lobe (image 38 of series 4). No other suspicious appearing pulmonary nodules or masses. No acute consolidative airspace disease. No pleural effusions. Musculoskeletal/Soft Tissues: There are no aggressive appearing lytic or blastic lesions noted in the visualized portions of the skeleton. CT ABDOMEN AND PELVIS FINDINGS Hepatobiliary: No cystic or solid hepatic lesions. No intra or extrahepatic biliary ductal dilatation. 7 mm calcified gallstone lying dependently in the gallbladder. No current findings to suggest an acute cholecystitis at this time. Pancreas: No pancreatic mass. No pancreatic ductal dilatation. No pancreatic or peripancreatic fluid or inflammatory changes. Spleen: Unremarkable. Adrenals/Urinary Tract: In the lower pole of the left kidney there is a 5.9 x 6.1 x 6.3 cm heterogeneously enhancing lesion highly concerning for renal cell carcinoma. The inferior aspect of this lesion comes in very close proximity to the anterior aspect of the left psoas muscle and quadratus lumborum muscle, however, there appears to be an intervening fat plane at this time. The lesion is well separated from the left renal vein. Right kidney and right adrenal gland are normal in appearance. 1.2 x 1.4 cm indeterminate nodule in the lateral limb of the left adrenal gland. No hydroureteronephrosis. Urinary bladder is largely decompressed, but otherwise unremarkable in appearance. Stomach/Bowel: Normal appearance of the stomach. No pathologic dilatation of small bowel or colon. A few scattered colonic diverticulae are noted, without surrounding inflammatory changes to suggest an acute diverticulitis at this time. Appendix is normal. Vascular/Lymphatic: Atherosclerosis throughout the abdominal and pelvic vasculature, without evidence of aneurysm or dissection. Left renal vein is widely patent and separate from the lower pole mass. No lymphadenopathy noted in the abdomen or pelvis. Reproductive: Uterus and  ovaries are unremarkable in appearance. Other: No significant volume of ascites.  No pneumoperitoneum. Musculoskeletal: There are no aggressive appearing lytic or blastic lesions noted in the visualized portions of the skeleton. IMPRESSION: 1. No acute findings in the abdomen or pelvis to account for the patient's symptoms. 2. However, there is a 5.9 x 6.1 x 6.3 cm heterogeneously enhancing mass in the lower pole of the left kidney highly concerning for renal cell carcinoma. At this time, the lesion appears likely to be encapsulated within Gerota's fascia (although it  comes very close to the left psoas and quadratus lumborum musculature), does not involve the left renal vein, and does not appear to be associated with lymphadenopathy. Nonemergent Urologic consultation for surgical resection is strongly recommended in the near future. 3. 1.2 x 1.4 cm indeterminate nodule in the left adrenal gland. Attention on followup studies is recommended, as a metastatic lesion is not excluded. 4. 4 mm subpleural nodules in the right middle lobe and left lower lobe. These are highly nonspecific, and favored to represent subpleural lymph nodes, but attention on followup studies is recommended to ensure stability. 5. Cholelithiasis without evidence of acute cholecystitis at this time. 6. Atherosclerosis, including left main and 3 vessel coronary artery disease. Please note that although the presence of coronary artery calcium documents the presence of coronary artery disease, the severity of this disease and any potential stenosis cannot be assessed on this non-gated CT examination. Assessment for potential risk factor modification, dietary therapy or pharmacologic therapy may be warranted, if clinically indicated. 7. There are calcifications of the aortic valve and mitral valve/annulus. Echocardiographic correlation for evaluation of potential valvular dysfunction may be warranted if clinically indicated. 8. Colonic diverticulosis  without evidence of acute diverticulitis at this time. 9. Additional incidental findings, as above. These results were called by telephone at the time of interpretation on 08/29/2015 at 7:34 pm to Dr. Benjiman Core, who verbally acknowledged these results. Electronically Signed   By: Trudie Reed M.D.   On: 08/29/2015 19:37   Ir Angiogram Visceral Selective  09/07/2015  INDICATION: History of duodenal ulcer now with acute upper GI bleed. Please perform mesenteric arteriogram and potential percutaneous coil embolization. EXAM: 1. ULTRASOUND GUIDANCE FOR ARTERIAL ACCESS. 2. SELECTIVE CELIAC AND SUPERIOR MESENTERIC ARTERIOGRAMS 3. SELECTED PROPER HEPATIC ARTERIOGRAM 4. SELECTIVE GASTRODUODENAL ARTERIOGRAM AND PERCUTANEOUS COIL EMBOLIZATION MEDICATIONS: None ANESTHESIA/SEDATION: Fentanyl 75 mcg IV; Versed 1.5 mg IV Moderate Sedation Time: 65 minutes; The patient was continuously monitored during the procedure by the interventional radiology nurse under my direct supervision. CONTRAST:  30mL OMNIPAQUE IOHEXOL 300 MG/ML  SOLN FLUOROSCOPY TIME:  Fluoroscopy Time: 18 minutes 24 seconds (1595 mGy). COMPLICATIONS: SIR Level A - No therapy, no consequence. The procedure was complicated by non targeted embolization of a a 2 mm x 4 mm non fiber interlock coil within a cranial subsegmental branch vessel of the splenic artery. PROCEDURE: Informed consent was obtained from the patient following explanation of the procedure, risks, benefits and alternatives. The patient understands, agrees and consents for the procedure. All questions were addressed. A time out was performed prior to the initiation of the procedure. Maximal barrier sterile technique utilized including caps, mask, sterile gowns, sterile gloves, large sterile drape, hand hygiene, and Betadine prep. The right femoral head was marked fluoroscopically. Under ultrasound guidance, the right common femoral artery was accessed with a micropuncture kit after the  overlying soft tissues were anesthetized with 1% lidocaine. An ultrasound image was saved for documentation purposes. The micropuncture sheath was exchanged for a 5 [place] vascular sheath over a Bentson wire. A closure arteriogram was performed through the side of the sheath confirming access within the right common femoral artery. Over a Bentson wire, a Mickelson catheter was advanced to the level of the thoracic aorta where it was back bled and flushed. The catheter was then utilized to select the celiac artery and a selective celiac arteriogram was performed. With the use of a Fathom micro wire, a regular Renegade micro catheter was utilized to select the proper hepatic artery and  a selective proper hepatic arteriogram was performed. The microcatheter was then utilized to select the gastroduodenal artery and a selective gastroduodenal arteriogram was performed. The micro catheter was advanced further into the distal aspect of the gastroduodenal artery. Following sub selective injection, a 2 mm x 4 mm non fibered interlock coil was attempted to be deployed within the distal aspect of the gastroduodenal artery however it became apparent that the micro catheter I was given was NOT a regular sized catheter but rather was a high-flow catheter. As such, coil became entrapped within the distal aspect of the micro catheter. With some manipulation, the coil was ultimately safely deployed within a cranial subsegmental branch of the splenic artery. The incorrect the microcatheter was discarded and a regular Renegade micro catheter was again utilized to select the gastroduodenal artery. Sub selective injection was performed and the gastroduodenal artery was percutaneously coil embolized with multiple overlapping 2 mm, 3 mm and 4 mm interlock fibered and non-fibered coils to near the vessel's origin. A completion proper hepatic arteriogram was performed. The micro catheter was removed and completion celiac and superior  mesenteric arteriograms were performed. Images reviewed and the procedure was terminated. All wires, catheters and sheaths were removed from the patient. Hemostasis was achieved at the right groin access site with deployment of an Exoseal closure device. A dressing was placed. The patient tolerated the procedure well without immediate postprocedural complication. FINDINGS: Celiac arteriogram demonstrates a conventional branching pattern. Superior mesenteric arteriogram failed to delineate any retrograde contribution to the gastroduodenal artery. Selective proper hepatic arteriogram demonstrates a markedly diminutive and irregular appearing gastroduodenal artery which was confirmed with selective gastroduodenal arteriogram. Ultimately successful percutaneous coil embolization of the gastroduodenal artery to near the vessels origin. Note, procedure was complicated by non target embolization of a cranial subsegmental branch vessel of the splenic artery. Completion proper hepatic arteriogram and demonstrates complete occlusion of the GDA. Completion celiac arteriogram demonstrates a hypertrophied gastroepiploic artery arising from the left gastric artery without definitive contribution to the duodenal bulb. IMPRESSION: 1. Technically successful prophylactic percutaneous coil embolization of the gastroduodenal artery for acute upper GI bleed. 2. Procedure complicated by non target embolization of a cranial subsegmental branch vessel of the splenic artery secondary to mismatch of catheter sizing. This non target coil is felt to be of no clinical significance. Above findings were discussed with Dr. Lala Lund , at the time of procedure completion. Electronically Signed   By: Sandi Mariscal M.D.   On: 09/07/2015 18:09   Ir Angiogram Visceral Selective  09/07/2015  INDICATION: History of duodenal ulcer now with acute upper GI bleed. Please perform mesenteric arteriogram and potential percutaneous coil embolization. EXAM:  1. ULTRASOUND GUIDANCE FOR ARTERIAL ACCESS. 2. SELECTIVE CELIAC AND SUPERIOR MESENTERIC ARTERIOGRAMS 3. SELECTED PROPER HEPATIC ARTERIOGRAM 4. SELECTIVE GASTRODUODENAL ARTERIOGRAM AND PERCUTANEOUS COIL EMBOLIZATION MEDICATIONS: None ANESTHESIA/SEDATION: Fentanyl 75 mcg IV; Versed 1.5 mg IV Moderate Sedation Time: 65 minutes; The patient was continuously monitored during the procedure by the interventional radiology nurse under my direct supervision. CONTRAST:  48m OMNIPAQUE IOHEXOL 300 MG/ML  SOLN FLUOROSCOPY TIME:  Fluoroscopy Time: 18 minutes 24 seconds (1595 mGy). COMPLICATIONS: SIR Level A - No therapy, no consequence. The procedure was complicated by non targeted embolization of a a 2 mm x 4 mm non fiber interlock coil within a cranial subsegmental branch vessel of the splenic artery. PROCEDURE: Informed consent was obtained from the patient following explanation of the procedure, risks, benefits and alternatives. The patient understands, agrees and consents for  the procedure. All questions were addressed. A time out was performed prior to the initiation of the procedure. Maximal barrier sterile technique utilized including caps, mask, sterile gowns, sterile gloves, large sterile drape, hand hygiene, and Betadine prep. The right femoral head was marked fluoroscopically. Under ultrasound guidance, the right common femoral artery was accessed with a micropuncture kit after the overlying soft tissues were anesthetized with 1% lidocaine. An ultrasound image was saved for documentation purposes. The micropuncture sheath was exchanged for a 5 [place] vascular sheath over a Bentson wire. A closure arteriogram was performed through the side of the sheath confirming access within the right common femoral artery. Over a Bentson wire, a Mickelson catheter was advanced to the level of the thoracic aorta where it was back bled and flushed. The catheter was then utilized to select the celiac artery and a selective celiac  arteriogram was performed. With the use of a Fathom micro wire, a regular Renegade micro catheter was utilized to select the proper hepatic artery and a selective proper hepatic arteriogram was performed. The microcatheter was then utilized to select the gastroduodenal artery and a selective gastroduodenal arteriogram was performed. The micro catheter was advanced further into the distal aspect of the gastroduodenal artery. Following sub selective injection, a 2 mm x 4 mm non fibered interlock coil was attempted to be deployed within the distal aspect of the gastroduodenal artery however it became apparent that the micro catheter I was given was NOT a regular sized catheter but rather was a high-flow catheter. As such, coil became entrapped within the distal aspect of the micro catheter. With some manipulation, the coil was ultimately safely deployed within a cranial subsegmental branch of the splenic artery. The incorrect the microcatheter was discarded and a regular Renegade micro catheter was again utilized to select the gastroduodenal artery. Sub selective injection was performed and the gastroduodenal artery was percutaneously coil embolized with multiple overlapping 2 mm, 3 mm and 4 mm interlock fibered and non-fibered coils to near the vessel's origin. A completion proper hepatic arteriogram was performed. The micro catheter was removed and completion celiac and superior mesenteric arteriograms were performed. Images reviewed and the procedure was terminated. All wires, catheters and sheaths were removed from the patient. Hemostasis was achieved at the right groin access site with deployment of an Exoseal closure device. A dressing was placed. The patient tolerated the procedure well without immediate postprocedural complication. FINDINGS: Celiac arteriogram demonstrates a conventional branching pattern. Superior mesenteric arteriogram failed to delineate any retrograde contribution to the gastroduodenal  artery. Selective proper hepatic arteriogram demonstrates a markedly diminutive and irregular appearing gastroduodenal artery which was confirmed with selective gastroduodenal arteriogram. Ultimately successful percutaneous coil embolization of the gastroduodenal artery to near the vessels origin. Note, procedure was complicated by non target embolization of a cranial subsegmental branch vessel of the splenic artery. Completion proper hepatic arteriogram and demonstrates complete occlusion of the GDA. Completion celiac arteriogram demonstrates a hypertrophied gastroepiploic artery arising from the left gastric artery without definitive contribution to the duodenal bulb. IMPRESSION: 1. Technically successful prophylactic percutaneous coil embolization of the gastroduodenal artery for acute upper GI bleed. 2. Procedure complicated by non target embolization of a cranial subsegmental branch vessel of the splenic artery secondary to mismatch of catheter sizing. This non target coil is felt to be of no clinical significance. Above findings were discussed with Dr. Susa Raring , at the time of procedure completion. Electronically Signed   By: Simonne Come M.D.   On: 09/07/2015  18:09   Ir Angiogram Selective Each Additional Vessel  09/07/2015  INDICATION: History of duodenal ulcer now with acute upper GI bleed. Please perform mesenteric arteriogram and potential percutaneous coil embolization. EXAM: 1. ULTRASOUND GUIDANCE FOR ARTERIAL ACCESS. 2. SELECTIVE CELIAC AND SUPERIOR MESENTERIC ARTERIOGRAMS 3. SELECTED PROPER HEPATIC ARTERIOGRAM 4. SELECTIVE GASTRODUODENAL ARTERIOGRAM AND PERCUTANEOUS COIL EMBOLIZATION MEDICATIONS: None ANESTHESIA/SEDATION: Fentanyl 75 mcg IV; Versed 1.5 mg IV Moderate Sedation Time: 65 minutes; The patient was continuously monitored during the procedure by the interventional radiology nurse under my direct supervision. CONTRAST:  60mL OMNIPAQUE IOHEXOL 300 MG/ML  SOLN FLUOROSCOPY TIME:   Fluoroscopy Time: 18 minutes 24 seconds (1595 mGy). COMPLICATIONS: SIR Level A - No therapy, no consequence. The procedure was complicated by non targeted embolization of a a 2 mm x 4 mm non fiber interlock coil within a cranial subsegmental branch vessel of the splenic artery. PROCEDURE: Informed consent was obtained from the patient following explanation of the procedure, risks, benefits and alternatives. The patient understands, agrees and consents for the procedure. All questions were addressed. A time out was performed prior to the initiation of the procedure. Maximal barrier sterile technique utilized including caps, mask, sterile gowns, sterile gloves, large sterile drape, hand hygiene, and Betadine prep. The right femoral head was marked fluoroscopically. Under ultrasound guidance, the right common femoral artery was accessed with a micropuncture kit after the overlying soft tissues were anesthetized with 1% lidocaine. An ultrasound image was saved for documentation purposes. The micropuncture sheath was exchanged for a 5 [place] vascular sheath over a Bentson wire. A closure arteriogram was performed through the side of the sheath confirming access within the right common femoral artery. Over a Bentson wire, a Mickelson catheter was advanced to the level of the thoracic aorta where it was back bled and flushed. The catheter was then utilized to select the celiac artery and a selective celiac arteriogram was performed. With the use of a Fathom micro wire, a regular Renegade micro catheter was utilized to select the proper hepatic artery and a selective proper hepatic arteriogram was performed. The microcatheter was then utilized to select the gastroduodenal artery and a selective gastroduodenal arteriogram was performed. The micro catheter was advanced further into the distal aspect of the gastroduodenal artery. Following sub selective injection, a 2 mm x 4 mm non fibered interlock coil was attempted to be  deployed within the distal aspect of the gastroduodenal artery however it became apparent that the micro catheter I was given was NOT a regular sized catheter but rather was a high-flow catheter. As such, coil became entrapped within the distal aspect of the micro catheter. With some manipulation, the coil was ultimately safely deployed within a cranial subsegmental branch of the splenic artery. The incorrect the microcatheter was discarded and a regular Renegade micro catheter was again utilized to select the gastroduodenal artery. Sub selective injection was performed and the gastroduodenal artery was percutaneously coil embolized with multiple overlapping 2 mm, 3 mm and 4 mm interlock fibered and non-fibered coils to near the vessel's origin. A completion proper hepatic arteriogram was performed. The micro catheter was removed and completion celiac and superior mesenteric arteriograms were performed. Images reviewed and the procedure was terminated. All wires, catheters and sheaths were removed from the patient. Hemostasis was achieved at the right groin access site with deployment of an Exoseal closure device. A dressing was placed. The patient tolerated the procedure well without immediate postprocedural complication. FINDINGS: Celiac arteriogram demonstrates a conventional branching pattern. Superior mesenteric  arteriogram failed to delineate any retrograde contribution to the gastroduodenal artery. Selective proper hepatic arteriogram demonstrates a markedly diminutive and irregular appearing gastroduodenal artery which was confirmed with selective gastroduodenal arteriogram. Ultimately successful percutaneous coil embolization of the gastroduodenal artery to near the vessels origin. Note, procedure was complicated by non target embolization of a cranial subsegmental branch vessel of the splenic artery. Completion proper hepatic arteriogram and demonstrates complete occlusion of the GDA. Completion celiac  arteriogram demonstrates a hypertrophied gastroepiploic artery arising from the left gastric artery without definitive contribution to the duodenal bulb. IMPRESSION: 1. Technically successful prophylactic percutaneous coil embolization of the gastroduodenal artery for acute upper GI bleed. 2. Procedure complicated by non target embolization of a cranial subsegmental branch vessel of the splenic artery secondary to mismatch of catheter sizing. This non target coil is felt to be of no clinical significance. Above findings were discussed with Dr. Lala Lund , at the time of procedure completion. Electronically Signed   By: Sandi Mariscal M.D.   On: 09/07/2015 18:09   Ir Angiogram Follow Up Study  09/07/2015  INDICATION: History of duodenal ulcer now with acute upper GI bleed. Please perform mesenteric arteriogram and potential percutaneous coil embolization. EXAM: 1. ULTRASOUND GUIDANCE FOR ARTERIAL ACCESS. 2. SELECTIVE CELIAC AND SUPERIOR MESENTERIC ARTERIOGRAMS 3. SELECTED PROPER HEPATIC ARTERIOGRAM 4. SELECTIVE GASTRODUODENAL ARTERIOGRAM AND PERCUTANEOUS COIL EMBOLIZATION MEDICATIONS: None ANESTHESIA/SEDATION: Fentanyl 75 mcg IV; Versed 1.5 mg IV Moderate Sedation Time: 65 minutes; The patient was continuously monitored during the procedure by the interventional radiology nurse under my direct supervision. CONTRAST:  67m OMNIPAQUE IOHEXOL 300 MG/ML  SOLN FLUOROSCOPY TIME:  Fluoroscopy Time: 18 minutes 24 seconds (1595 mGy). COMPLICATIONS: SIR Level A - No therapy, no consequence. The procedure was complicated by non targeted embolization of a a 2 mm x 4 mm non fiber interlock coil within a cranial subsegmental branch vessel of the splenic artery. PROCEDURE: Informed consent was obtained from the patient following explanation of the procedure, risks, benefits and alternatives. The patient understands, agrees and consents for the procedure. All questions were addressed. A time out was performed prior to the  initiation of the procedure. Maximal barrier sterile technique utilized including caps, mask, sterile gowns, sterile gloves, large sterile drape, hand hygiene, and Betadine prep. The right femoral head was marked fluoroscopically. Under ultrasound guidance, the right common femoral artery was accessed with a micropuncture kit after the overlying soft tissues were anesthetized with 1% lidocaine. An ultrasound image was saved for documentation purposes. The micropuncture sheath was exchanged for a 5 [place] sheath over a Bentson wire. A closure arteriogram was performed through the side of the sheath confirming access within the right common femoral artery. Over a Bentson wire, a Mickelson catheter was advanced to the level of the thoracic aorta where it was back bled and flushed. The catheter was then utilized to select the celiac artery and a selective celiac arteriogram was performed. With the use of a Fathom micro wire, a regular Renegade micro catheter was utilized to select the proper hepatic artery and a selective proper hepatic arteriogram was performed. The microcatheter was then utilized to select the gastroduodenal artery and a selective gastroduodenal arteriogram was performed. The micro catheter was advanced further into the distal aspect of the gastroduodenal artery. Following sub selective injection, a 2 mm x 4 mm non fibered interlock coil was attempted to be deployed within the distal aspect of the gastroduodenal artery however it became apparent that the micro catheter I was given  was NOT a regular sized catheter but rather was a high-flow catheter. As such, coil became entrapped within the distal aspect of the micro catheter. With some manipulation, the coil was ultimately safely deployed within a cranial subsegmental branch of the splenic artery. The incorrect the microcatheter was discarded and a regular Renegade micro catheter was again utilized to select the gastroduodenal artery. Sub  selective injection was performed and the gastroduodenal artery was percutaneously coil embolized with multiple overlapping 2 mm, 3 mm and 4 mm interlock fibered and non-fibered coils to near the vessel's origin. A completion proper hepatic arteriogram was performed. The micro catheter was removed and completion celiac and superior mesenteric arteriograms were performed. Images reviewed and the procedure was terminated. All wires, catheters and sheaths were removed from the patient. Hemostasis was achieved at the right groin access site with deployment of an Exoseal closure device. A dressing was placed. The patient tolerated the procedure well without immediate postprocedural complication. FINDINGS: Celiac arteriogram demonstrates a conventional branching pattern. Superior mesenteric arteriogram failed to delineate any retrograde contribution to the gastroduodenal artery. Selective proper hepatic arteriogram demonstrates a markedly diminutive and irregular appearing gastroduodenal artery which was confirmed with selective gastroduodenal arteriogram. Ultimately successful percutaneous coil embolization of the gastroduodenal artery to near the vessels origin. Note, procedure was complicated by non target embolization of a cranial subsegmental branch vessel of the splenic artery. Completion proper hepatic arteriogram and demonstrates complete occlusion of the GDA. Completion celiac arteriogram demonstrates a hypertrophied gastroepiploic artery arising from the left gastric artery without definitive contribution to the duodenal bulb. IMPRESSION: 1. Technically successful prophylactic percutaneous coil embolization of the gastroduodenal artery for acute upper GI bleed. 2. Procedure complicated by non target embolization of a cranial subsegmental branch vessel of the splenic artery secondary to mismatch of catheter sizing. This non target coil is felt to be of no clinical significance. Above findings were discussed with  Dr. Lala Lund , at the time of procedure completion. Electronically Signed   By: Sandi Mariscal M.D.   On: 09/07/2015 18:09   Ir US Guide Vasc Access Right  09/07/2015  INDICATION: History of duodenal ulcer now with acute upper GI bleed. Please perform mesenteric arteriogram and potential percutaneous coil embolization. EXAM: 1. ULTRASOUND GUIDANCE FOR ARTERIAL ACCESS. 2. SELECTIVE CELIAC AND SUPERIOR MESENTERIC ARTERIOGRAMS 3. SELECTED PROPER HEPATIC ARTERIOGRAM 4. SELECTIVE GASTRODUODENAL ARTERIOGRAM AND PERCUTANEOUS COIL EMBOLIZATION MEDICATIONS: None ANESTHESIA/SEDATION: Fentanyl 75 mcg IV; Versed 1.5 mg IV Moderate Sedation Time: 65 minutes; The patient was continuously monitored during the procedure by the interventional radiology nurse under my direct supervision. CONTRAST:  36m OMNIPAQUE IOHEXOL 300 MG/ML  SOLN FLUOROSCOPY TIME:  Fluoroscopy Time: 18 minutes 24 seconds (1595 mGy). COMPLICATIONS: SIR Level A - No therapy, no consequence. The procedure was complicated by non targeted embolization of a a 2 mm x 4 mm non fiber interlock coil within a cranial subsegmental branch vessel of the splenic artery. PROCEDURE: Informed consent was obtained from the patient following explanation of the procedure, risks, benefits and alternatives. The patient understands, agrees and consents for the procedure. All questions were addressed. A time out was performed prior to the initiation of the procedure. Maximal barrier sterile technique utilized including caps, mask, sterile gowns, sterile gloves, large sterile drape, hand hygiene, and Betadine prep. The right femoral head was marked fluoroscopically. Under ultrasound guidance, the right common femoral artery was accessed with a micropuncture kit after the overlying soft tissues were anesthetized with 1% lidocaine. An ultrasound image was saved  for documentation purposes. The micropuncture sheath was exchanged for a 5 [place] vascular sheath over a Bentson wire. A  closure arteriogram was performed through the side of the sheath confirming access within the right common femoral artery. Over a Bentson wire, a Mickelson catheter was advanced to the level of the thoracic aorta where it was back bled and flushed. The catheter was then utilized to select the celiac artery and a selective celiac arteriogram was performed. With the use of a Fathom micro wire, a regular Renegade micro catheter was utilized to select the proper hepatic artery and a selective proper hepatic arteriogram was performed. The microcatheter was then utilized to select the gastroduodenal artery and a selective gastroduodenal arteriogram was performed. The micro catheter was advanced further into the distal aspect of the gastroduodenal artery. Following sub selective injection, a 2 mm x 4 mm non fibered interlock coil was attempted to be deployed within the distal aspect of the gastroduodenal artery however it became apparent that the micro catheter I was given was NOT a regular sized catheter but rather was a high-flow catheter. As such, coil became entrapped within the distal aspect of the micro catheter. With some manipulation, the coil was ultimately safely deployed within a cranial subsegmental branch of the splenic artery. The incorrect the microcatheter was discarded and a regular Renegade micro catheter was again utilized to select the gastroduodenal artery. Sub selective injection was performed and the gastroduodenal artery was percutaneously coil embolized with multiple overlapping 2 mm, 3 mm and 4 mm interlock fibered and non-fibered coils to near the vessel's origin. A completion proper hepatic arteriogram was performed. The micro catheter was removed and completion celiac and superior mesenteric arteriograms were performed. Images reviewed and the procedure was terminated. All wires, catheters and sheaths were removed from the patient. Hemostasis was achieved at the right groin access site with  deployment of an Exoseal closure device. A dressing was placed. The patient tolerated the procedure well without immediate postprocedural complication. FINDINGS: Celiac arteriogram demonstrates a conventional branching pattern. Superior mesenteric arteriogram failed to delineate any retrograde contribution to the gastroduodenal artery. Selective proper hepatic arteriogram demonstrates a markedly diminutive and irregular appearing gastroduodenal artery which was confirmed with selective gastroduodenal arteriogram. Ultimately successful percutaneous coil embolization of the gastroduodenal artery to near the vessels origin. Note, procedure was complicated by non target embolization of a cranial subsegmental branch vessel of the splenic artery. Completion proper hepatic arteriogram and demonstrates complete occlusion of the GDA. Completion celiac arteriogram demonstrates a hypertrophied gastroepiploic artery arising from the left gastric artery without definitive contribution to the duodenal bulb. IMPRESSION: 1. Technically successful prophylactic percutaneous coil embolization of the gastroduodenal artery for acute upper GI bleed. 2. Procedure complicated by non target embolization of a cranial subsegmental branch vessel of the splenic artery secondary to mismatch of catheter sizing. This non target coil is felt to be of no clinical significance. Above findings were discussed with Dr. Lala Lund , at the time of procedure completion. Electronically Signed   By: Sandi Mariscal M.D.   On: 09/07/2015 18:09   Dg Chest Port 1 View  09/23/2015  CLINICAL DATA:  61 year old female with shortness of breath and cough EXAM: PORTABLE CHEST 1 VIEW COMPARISON:  Radiograph dated 09/14/2015 FINDINGS: Right-sided PICC in stable positioning. Tracheostomy with tip above the carina in stable positioning. Single-view of the chest demonstrate a focal area of hazy density at the left lung base, likely atelectatic changes. Pneumonia is  less likely but not excluded.  Clinical correlation is recommended. The lungs are hypovolemic. No pleural effusion or pneumothorax. Stable cardiac silhouette. No acute osseous pathology. IMPRESSION: Minimal left lung base atelectatic changes versus less likely pneumonia. Clinical correlation recommended. Electronically Signed   By: Anner Crete M.D.   On: 09/23/2015 18:30   Dg Chest Portable 1 View  09/14/2015  CLINICAL DATA:  Shortness of breath tonight. EXAM: PORTABLE CHEST 1 VIEW COMPARISON:  Frontal and lateral views 2 days prior 09/12/2015 FINDINGS: Tracheostomy tube at the thoracic inlet. Tip of the right upper extremity PICC in the mid-distal SVC. Cardiomediastinal contours are unchanged. Mild bibasilar atelectasis. No consolidation to suggest pneumonia. No large pleural effusion or pneumothorax. IMPRESSION: 1. Tracheostomy tube in right upper extremity PICC in place. 2. Mild bibasilar atelectasis. Electronically Signed   By: Jeb Levering M.D.   On: 09/14/2015 03:52   Dg Chest Port 1 View  09/04/2015  CLINICAL DATA:  Central line placement. EXAM: PORTABLE CHEST 1 VIEW COMPARISON:  08/29/2015 FINDINGS: Endotracheal tube terminates 6 cm above the carina. Right internal jugular approach central venous catheter seen with tip at the expected location of superior vena cava. Cardiomediastinal silhouette is enlarged, likely exaggerated by portable technique and rotation. Mediastinal contours appear intact. There is no evidence of focal airspace consolidation, pleural effusion or pneumothorax. Lung volumes are low. Osseous structures are without acute abnormality. Soft tissues are grossly normal. IMPRESSION: Low lung volumes. Endotracheal tube 6 cm above the carina. Advancement with 2 cm may be considered. Right-sided central venous catheter in satisfactory position radiographically. Electronically Signed   By: Fidela Salisbury M.D.   On: 09/04/2015 16:06   Dg Swallowing Func-speech  Pathology  09/08/2015  Objective Swallowing Evaluation: Type of Study: MBS-Modified Barium Swallow Study Patient Details Name: JEARLDEAN GUTT MRN: [id number] Date of Birth: 1956/02/23 Today's Date: 09/08/2015 Time: SLP Start Time (ACUTE ONLY): 1032-SLP Stop Time (ACUTE ONLY): 1058 SLP Time Calculation (min) (ACUTE ONLY): 26 min Past Medical History: Past Medical History Diagnosis Date . Thyroid disease  . Hypertension  . Neck mass hospitalized 08/29/2015 . Type II diabetes mellitus ([place])  . Left kidney mass  . Anemia, chronic disease  Past Surgical History: Past Surgical History Procedure Laterality Date . No past surgeries   . Esophagogastroduodenoscopy N/A 09/04/2015   Procedure: ESOPHAGOGASTRODUODENOSCOPY (EGD);  Surgeon: Manus Gunning, MD;  Location: [facility];  Service: Gastroenterology;  Laterality: N/A; . Direct laryngoscopy N/A 09/02/2015   Procedure: DIRECT LARYNGOSCOPY WITH BIOPSY;  Surgeon: Izora Gala, MD;  Location: [facility];  Service: ENT;  Laterality: N/A; . Esophagoscopy N/A 09/02/2015   Procedure: ESOPHAGOSCOPY;  Surgeon: Izora Gala, MD;  Location: [facility];  Service: ENT;  Laterality: N/A; . Tracheostomy tube placement N/A 09/04/2015   Procedure: TRACHEOSTOMY;  Surgeon: Izora Gala, MD;  Location: [facility];  Service: ENT;  Laterality: N/A; . Direct laryngoscopy  09/04/2015   Procedure: DIRECT View LARYNGOSCOPY with cautery of of tumor;  Surgeon: Izora Gala, MD;  Location: [facility];  Service: ENT;; HPI: Pt is 60 y.o. female with h/o hypertension and NIDDM. Pt presented to ED with voice changes, difficult swallowing and abdominal discomfort that have progressed over the last month. 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 MBS 1/21 recommended Dys 1, pudding liquids. GI bleed, made NPO, swallow assessment re-ordered. SLP recommended repeat MBS  Subjective: pt alert, eager for food/drink Assessment / Plan / Recommendation CHL IP CLINICAL IMPRESSIONS  09/08/2015 Therapy Diagnosis Moderate pharyngeal phase  dysphagia;Severe pharyngeal phase dysphagia Clinical Impression Pt's swallow function decreased from initial MBS 1/21. Swallow function assessed without speaking valve due to poor tolerance during yesterday's treatment session. She exhibited moderate-severe anatomically based pharyngeal dysphagia resulting in sensory and motor deficits. Delayed initiation of swallow and poor base of tongue retraction lead to moderate vallecular residue that was observed throughout the session. Aspiration was noted of both puree and honey thick liquids and pt did not sense aspirated bolus. Moderate verbal and visual cues to cough and swallow multiple times ineffective although finger occlusion of trach increased pressure which helped to significantly remove penetrates. 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 Recommend NPO except for free water protocol under strict guidelines.   Impact on safety and function Severe aspiration risk;Risk for inadequate nutrition/hydration   CHL IP TREATMENT RECOMMENDATION 09/08/2015 Treatment Recommendations Therapy as outlined in treatment plan below   Prognosis 09/08/2015 Prognosis for Safe Diet Advancement Guarded Barriers to Reach Goals Severity of deficits Barriers/Prognosis Comment -- CHL IP DIET RECOMMENDATION 09/08/2015 SLP Diet Recommendations NPO Liquid Administration via -- Medication Administration Via alternative means Compensations -- Postural Changes --   CHL IP OTHER RECOMMENDATIONS 09/08/2015 Recommended Consults -- Oral Care Recommendations Oral care QID Other Recommendations --   CHL IP FOLLOW UP RECOMMENDATIONS 09/08/2015 Follow up Recommendations 24 hour supervision/assistance   CHL IP FREQUENCY AND DURATION 09/08/2015 Speech Therapy Frequency (ACUTE ONLY) min 2x/week Treatment Duration 2 weeks      CHL IP ORAL PHASE 09/08/2015 Oral Phase WFL Oral - Pudding Teaspoon -- Oral - Pudding  Cup -- Oral - Honey Teaspoon -- Oral - Honey Cup -- Oral - Nectar Teaspoon -- Oral - Nectar Cup -- Oral - Nectar Straw -- Oral - Thin Teaspoon -- Oral - Thin Cup -- Oral - Thin Straw -- Oral - Puree -- Oral - Mech Soft -- Oral - Regular -- Oral - Multi-Consistency -- Oral - Pill -- Oral Phase - Comment --  CHL IP PHARYNGEAL PHASE 09/08/2015 Pharyngeal Phase Impaired Pharyngeal- Pudding Teaspoon NT Pharyngeal Material enters airway, CONTACTS cords and not ejected out;Material enters airway, passes BELOW cords without attempt by patient to eject out (silent aspiration) Pharyngeal- Pudding Cup -- Pharyngeal -- Pharyngeal- Honey Teaspoon Delayed swallow initiation-vallecula;Reduced anterior laryngeal mobility;Reduced airway/laryngeal closure;Reduced laryngeal elevation;Reduced tongue base retraction;Penetration/Apiration after swallow;Pharyngeal residue - valleculae Pharyngeal Material enters airway, CONTACTS cords and not ejected out Pharyngeal- Honey Cup Delayed swallow initiation-vallecula;Reduced anterior laryngeal mobility;Reduced laryngeal elevation;Reduced airway/laryngeal closure;Reduced tongue base retraction;Penetration/Apiration after swallow;Moderate aspiration;Pharyngeal residue - valleculae;Compensatory strategies attempted (with notebox) Pharyngeal Material enters airway, passes BELOW cords without attempt by patient to eject out (silent aspiration) Pharyngeal- Nectar Teaspoon NT Pharyngeal -- Pharyngeal- Nectar Cup -- Pharyngeal -- Pharyngeal- Nectar Straw -- Pharyngeal -- Pharyngeal- Thin Teaspoon NT Pharyngeal -- Pharyngeal- Thin Cup -- Pharyngeal -- Pharyngeal- Thin Straw NT Pharyngeal -- Pharyngeal- Puree Delayed swallow initiation-vallecula;Pharyngeal residue - valleculae;Penetration/Apiration after swallow;Reduced anterior laryngeal mobility;Reduced airway/laryngeal closure;Reduced tongue base retraction Pharyngeal Material enters airway, passes BELOW cords without attempt by patient to eject out  (silent aspiration) Pharyngeal- Mechanical Soft -- Pharyngeal -- Pharyngeal- Regular -- Pharyngeal -- Pharyngeal- Multi-consistency -- Pharyngeal -- Pharyngeal- Pill -- Pharyngeal -- Pharyngeal Comment --  CHL IP CERVICAL ESOPHAGEAL PHASE 09/08/2015 Cervical Esophageal Phase WFL Pudding Teaspoon -- Pudding Cup -- Honey Teaspoon -- Honey Cup -- Nectar Teaspoon -- Nectar Cup -- Nectar Straw -- Thin Teaspoon -- Thin Cup -- Thin Straw -- Puree -- Mechanical Soft -- Regular -- Multi-consistency --  Pill -- Cervical Esophageal Comment -- No flowsheet data found. [place] Siren 09/08/2015, 2:11 PM Orbie Pyo Colvin Caroli.Ed CCC-SLP Pager [phone]              Dg Swallowing Func-speech Pathology  09/03/2015  Objective Swallowing Evaluation:   Patient Details Name: TANAIA HAWKEY MRN: [id number] Date of Birth: 1955-12-25 Today's Date: 09/03/2015 Time: SLP Start Time (ACUTE ONLY): 1124-SLP Stop Time (ACUTE ONLY): 1143 SLP Time Calculation (min) (ACUTE ONLY): 19 min Past Medical History: Past Medical History Diagnosis Date . Thyroid disease  . Hypertension  . Neck mass hospitalized 08/29/2015 . Type II diabetes mellitus ([place])  . Left kidney mass  . Anemia, chronic disease  Past Surgical History: Past Surgical History Procedure Laterality Date . No past surgeries   HPI: Pt is 60 y.o. female with h/o hypertension and NIDDM. Pt presented to ED with voice changes, difficult swallowing and abdominal discomfort that have progressed over the last month. 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 Subjective: pt alert, eager for food/drink Assessment / Plan / Recommendation CHL IP CLINICAL IMPRESSIONS 09/03/2015 Therapy Diagnosis Severe pharyngeal phase dysphagia Clinical Impression Pt has a severe pharyngeal dysphaghia secondary to presence of supraglottic mass, which impedes bolus flow and decreased airway closure. All consistencies enter the airway during the swallow even with  tsp-sized boluses. Aspiration is usually sensed, but she is not able to expel aspirates from her trachea. A chin tuck paired with an immediate cough facilitates clearance through the pharynx and clearance of penetrates with all consistencies tested; however, all liquids are then re-penetrated and aspirated after the swallow. Purees do not re-enter the airway, although aspiration risk remains due to residue. Recommend Dys 1 diet and pudding thick liquids with a chin tuck and immediate cough following all bites. Impact on safety and function Moderate aspiration risk;Risk for inadequate nutrition/hydration   CHL IP TREATMENT RECOMMENDATION 09/03/2015 Treatment Recommendations Therapy as outlined in treatment plan below   Prognosis 09/03/2015 Prognosis for Safe Diet Advancement Guarded Barriers to Reach Goals -- Barriers/Prognosis Comment -- CHL IP DIET RECOMMENDATION 09/03/2015 SLP Diet Recommendations Dysphagia 1 (Puree) solids;Pudding thick liquid Liquid Administration via Spoon Medication Administration Crushed with puree Compensations Slow rate;Small sips/bites;Chin tuck;Hard cough after swallow Postural Changes Remain semi-upright after after feeds/meals (Comment);Seated upright at 90 degrees   CHL IP OTHER RECOMMENDATIONS 09/03/2015 Recommended Consults -- Oral Care Recommendations Oral care BID Other Recommendations Order thickener from pharmacy;Prohibited food (jello, ice cream, thin soups);Remove water pitcher   CHL IP FOLLOW UP RECOMMENDATIONS 09/03/2015 Follow up Recommendations Outpatient SLP;24 hour supervision/assistance   CHL IP FREQUENCY AND DURATION 09/03/2015 Speech Therapy Frequency (ACUTE ONLY) min 2x/week Treatment Duration 2 weeks      CHL IP ORAL PHASE 09/03/2015 Oral Phase WFL Oral - Pudding Teaspoon -- Oral - Pudding Cup -- Oral - Honey Teaspoon -- Oral - Honey Cup -- Oral - Nectar Teaspoon -- Oral - Nectar Cup -- Oral - Nectar Straw -- Oral - Thin Teaspoon -- Oral - Thin Cup -- Oral - Thin Straw --  Oral - Puree -- Oral - Mech Soft -- Oral - Regular -- Oral - Multi-Consistency -- Oral - Pill -- Oral Phase - Comment --  CHL IP PHARYNGEAL PHASE 09/03/2015 Pharyngeal Phase Impaired Pharyngeal- Pudding Teaspoon -- Pharyngeal -- Pharyngeal- Pudding Cup -- Pharyngeal -- Pharyngeal- Honey Teaspoon Reduced airway/laryngeal closure;Penetration/Aspiration during swallow;Penetration/Apiration after swallow;Pharyngeal residue - pyriform;Pharyngeal residue - posterior pharnyx;Compensatory strategies attempted (with notebox)  Pharyngeal Material enters airway, passes BELOW cords and not ejected out despite cough attempt by patient Pharyngeal- Honey Cup -- Pharyngeal -- Pharyngeal- Nectar Teaspoon Reduced airway/laryngeal closure;Penetration/Aspiration during swallow;Penetration/Apiration after swallow;Pharyngeal residue - pyriform;Pharyngeal residue - posterior pharnyx;Compensatory strategies attempted (with notebox) Pharyngeal Material enters airway, passes BELOW cords and not ejected out despite cough attempt by patient Pharyngeal- Nectar Cup -- Pharyngeal -- Pharyngeal- Nectar Straw -- Pharyngeal -- Pharyngeal- Thin Teaspoon Reduced airway/laryngeal closure;Penetration/Aspiration during swallow;Penetration/Apiration after swallow;Pharyngeal residue - pyriform;Pharyngeal residue - posterior pharnyx;Compensatory strategies attempted (with notebox) Pharyngeal Material enters airway, passes BELOW cords and not ejected out despite cough attempt by patient Pharyngeal- Thin Cup -- Pharyngeal -- Pharyngeal- Thin Straw Reduced airway/laryngeal closure;Penetration/Aspiration during swallow;Penetration/Apiration after swallow;Pharyngeal residue - pyriform;Pharyngeal residue - posterior pharnyx;Compensatory strategies attempted (with notebox) Pharyngeal Material enters airway, passes BELOW cords and not ejected out despite cough attempt by patient Pharyngeal- Puree Reduced airway/laryngeal closure;Penetration/Aspiration during  swallow;Compensatory strategies attempted (with notebox);Pharyngeal residue - posterior pharnyx Pharyngeal Material enters airway, remains ABOVE vocal cords and not ejected out Pharyngeal- Mechanical Soft -- Pharyngeal -- Pharyngeal- Regular -- Pharyngeal -- Pharyngeal- Multi-consistency -- Pharyngeal -- Pharyngeal- Pill -- Pharyngeal -- Pharyngeal Comment --  CHL IP CERVICAL ESOPHAGEAL PHASE 09/03/2015 Cervical Esophageal Phase (No Data) Pudding Teaspoon -- Pudding Cup -- Honey Teaspoon -- Honey Cup -- Nectar Teaspoon -- Nectar Cup -- Nectar Straw -- Thin Teaspoon -- Thin Cup -- Thin Straw -- Puree -- Mechanical Soft -- Regular -- Multi-consistency -- Pill -- Cervical Esophageal Comment -- No flowsheet data found. Germain Osgood, M.A. CCC-SLP [phone] Germain Osgood 09/03/2015, 1:33 PM              [place] [facility] [employer] Guide [employer]  09/07/2015  INDICATION: History of duodenal ulcer now with acute upper GI bleed. Please perform mesenteric arteriogram and potential percutaneous coil embolization. EXAM: 1. ULTRASOUND GUIDANCE FOR ARTERIAL ACCESS. 2. SELECTIVE CELIAC AND SUPERIOR MESENTERIC ARTERIOGRAMS 3. SELECTED PROPER HEPATIC ARTERIOGRAM 4. SELECTIVE GASTRODUODENAL ARTERIOGRAM AND PERCUTANEOUS COIL EMBOLIZATION MEDICATIONS: None ANESTHESIA/SEDATION: Fentanyl 75 mcg IV; Versed 1.5 mg IV Moderate Sedation Time: 65 minutes; The patient was continuously monitored during the procedure by the interventional radiology nurse under my direct supervision. CONTRAST:  71m OMNIPAQUE IOHEXOL 300 MG/ML  SOLN FLUOROSCOPY TIME:  Fluoroscopy Time: 18 minutes 24 seconds (1595 mGy). COMPLICATIONS: SIR Level A - No therapy, no consequence. The procedure was complicated by non targeted embolization of a a 2 mm x 4 mm non fiber interlock coil within a cranial subsegmental branch vessel of the splenic artery. PROCEDURE: Informed consent was obtained from the patient following explanation of the  procedure, risks, benefits and alternatives. The patient understands, agrees and consents for the procedure. All questions were addressed. A time out was performed prior to the initiation of the procedure. Maximal barrier sterile technique utilized including caps, mask, sterile gowns, sterile gloves, large sterile drape, hand hygiene, and Betadine prep. The right femoral head was marked fluoroscopically. Under ultrasound guidance, the right common femoral artery was accessed with a micropuncture kit after the overlying soft tissues were anesthetized with 1% lidocaine. An ultrasound image was saved for documentation purposes. The micropuncture sheath was exchanged for a 5 [place] sheath over a Bentson wire. A closure arteriogram was performed through the side of the sheath confirming access within the right common femoral artery. Over a Bentson wire, a Mickelson catheter was advanced to the level of the thoracic aorta where it was back bled and flushed.  The catheter was then utilized to select the celiac artery and a selective celiac arteriogram was performed. With the use of a Fathom micro wire, a regular Renegade micro catheter was utilized to select the proper hepatic artery and a selective proper hepatic arteriogram was performed. The microcatheter was then utilized to select the gastroduodenal artery and a selective gastroduodenal arteriogram was performed. The micro catheter was advanced further into the distal aspect of the gastroduodenal artery. Following sub selective injection, a 2 mm x 4 mm non fibered interlock coil was attempted to be deployed within the distal aspect of the gastroduodenal artery however it became apparent that the micro catheter I was given was NOT a regular sized catheter but rather was a high-flow catheter. As such, coil became entrapped within the distal aspect of the micro catheter. With some manipulation, the coil was ultimately safely deployed within a cranial subsegmental  branch of the splenic artery. The incorrect the microcatheter was discarded and a regular Renegade micro catheter was again utilized to select the gastroduodenal artery. Sub selective injection was performed and the gastroduodenal artery was percutaneously coil embolized with multiple overlapping 2 mm, 3 mm and 4 mm interlock fibered and non-fibered coils to near the vessel's origin. A completion proper hepatic arteriogram was performed. The micro catheter was removed and completion celiac and superior mesenteric arteriograms were performed. Images reviewed and the procedure was terminated. All wires, catheters and sheaths were removed from the patient. Hemostasis was achieved at the right groin access site with deployment of an Exoseal closure device. A dressing was placed. The patient tolerated the procedure well without immediate postprocedural complication. FINDINGS: Celiac arteriogram demonstrates a conventional branching pattern. Superior mesenteric arteriogram failed to delineate any retrograde contribution to the gastroduodenal artery. Selective proper hepatic arteriogram demonstrates a markedly diminutive and irregular appearing gastroduodenal artery which was confirmed with selective gastroduodenal arteriogram. Ultimately successful percutaneous coil embolization of the gastroduodenal artery to near the vessels origin. Note, procedure was complicated by non target embolization of a cranial subsegmental branch vessel of the splenic artery. Completion proper hepatic arteriogram and demonstrates complete occlusion of the GDA. Completion celiac arteriogram demonstrates a hypertrophied gastroepiploic artery arising from the left gastric artery without definitive contribution to the duodenal bulb. IMPRESSION: 1. Technically successful prophylactic percutaneous coil embolization of the gastroduodenal artery for acute upper GI bleed. 2. Procedure complicated by non target embolization of a cranial subsegmental  branch vessel of the splenic artery secondary to mismatch of catheter sizing. This non target coil is felt to be of no clinical significance. Above findings were discussed with Dr. Lala Lund , at the time of procedure completion. Electronically Signed   By: Sandi Mariscal M.D.   On: 09/07/2015 18:09    MDM  Plan intravenous antibiotics, hydration.  C no ode sepsis called due to sirs criteria leukocytosis, lactic acidosis, tachypnea. Suspected source unknown. Dr Alcario Drought consulted for admission. Will make ranges for admission to stepdown unit. Final diagnoses:  None  Patient meets clinical criteria for severe sepsis with at least 2 SIRS, evidence of end organ damage and/ lactic acid elevation and suspected source of  Infection is pulmonary. Responsive to fluid resuscitation.  Diagnosis #1 severe sepsis #2 health care associated pneumonia #3 acute kidneyin jury #4 hyperglycemia #5 hypernatreima   CRITICAL CARE Performed by: Orlie Dakin Total critical care time: 40 minutes Critical care time was exclusive of separately billable procedures and treating other patients. Critical care was necessary to treat or prevent imminent or life-threatening deterioration.  Critical care was time spent personally by me on the following activities: development of treatment plan with patient and/or surrogate as well as nursing, discussions with consultants, evaluation of patient's response to treatment, examination of patient, obtaining history from patient or surrogate, ordering and performing treatments and interventions, ordering and review of laboratory studies, ordering and review of radiographic studies, pulse oximetry and re-evaluation of patient's condition.   Orlie Dakin, MD 09/23/15 2104

## 2015-09-23 NOTE — Progress Notes (Signed)
Ended LDA from last admision. Started new LDA and placed PT on ATC 28%

## 2015-09-23 NOTE — ED Notes (Signed)
Pt arrives from PCP via [facility] c/o SOB with cough. Trach placed 3 weeks ago with a hx of throat and lung cancer. Per family pt has been declining since trach placed

## 2015-09-24 LAB — GLUCOSE, CAPILLARY
Glucose-Capillary: 130 mg/dL — ABNORMAL HIGH (ref 65–99)
Glucose-Capillary: 232 mg/dL — ABNORMAL HIGH (ref 65–99)
Glucose-Capillary: 77 mg/dL (ref 65–99)
Glucose-Capillary: 88 mg/dL (ref 65–99)
Glucose-Capillary: 97 mg/dL (ref 65–99)

## 2015-09-24 LAB — HIV ANTIBODY (ROUTINE TESTING W REFLEX): HIV SCREEN 4TH GENERATION: NONREACTIVE

## 2015-09-24 LAB — FERRITIN: Ferritin: 1241 ng/mL — ABNORMAL HIGH (ref 11–307)

## 2015-09-24 LAB — OCCULT BLOOD X 1 CARD TO LAB, STOOL: Fecal Occult Bld: POSITIVE — AB

## 2015-09-24 LAB — RETICULOCYTES
RBC.: 2.57 MIL/uL — AB (ref 3.87–5.11)
RETIC CT PCT: 2.1 % (ref 0.4–3.1)
Retic Count, Absolute: 54 10*3/uL (ref 19.0–186.0)

## 2015-09-24 LAB — CBC
HCT: 23.1 % — ABNORMAL LOW (ref 36.0–46.0)
Hemoglobin: 7.1 g/dL — ABNORMAL LOW (ref 12.0–15.0)
MCH: 26.4 pg (ref 26.0–34.0)
MCHC: 30.7 g/dL (ref 30.0–36.0)
MCV: 85.9 fL (ref 78.0–100.0)
PLATELETS: 186 10*3/uL (ref 150–400)
RBC: 2.69 MIL/uL — ABNORMAL LOW (ref 3.87–5.11)
RDW: 15.5 % (ref 11.5–15.5)
WBC: 17.9 10*3/uL — AB (ref 4.0–10.5)

## 2015-09-24 LAB — VITAMIN B12: VITAMIN B 12: 625 pg/mL (ref 180–914)

## 2015-09-24 LAB — IRON AND TIBC
Iron: 8 ug/dL — ABNORMAL LOW (ref 28–170)
SATURATION RATIOS: 6 % — AB (ref 10.4–31.8)
TIBC: 134 ug/dL — ABNORMAL LOW (ref 250–450)
UIBC: 126 ug/dL

## 2015-09-24 LAB — INFLUENZA PANEL BY PCR (TYPE A & B)
H1N1FLUPCR: NOT DETECTED
INFLAPCR: NEGATIVE
INFLBPCR: NEGATIVE

## 2015-09-24 LAB — BASIC METABOLIC PANEL
Anion gap: 6 (ref 5–15)
BUN: 59 mg/dL — ABNORMAL HIGH (ref 6–20)
CALCIUM: 8 mg/dL — AB (ref 8.9–10.3)
CO2: 20 mmol/L — ABNORMAL LOW (ref 22–32)
CREATININE: 1.19 mg/dL — AB (ref 0.44–1.00)
Chloride: 121 mmol/L — ABNORMAL HIGH (ref 101–111)
GFR calc Af Amer: 57 mL/min — ABNORMAL LOW (ref >60)
GFR, EST NON AFRICAN AMERICAN: 49 mL/min — AB (ref >60)
Glucose, Bld: 177 mg/dL — ABNORMAL HIGH (ref 65–99)
Potassium: 3.8 mmol/L (ref 3.5–5.1)
SODIUM: 147 mmol/L — AB (ref 135–145)

## 2015-09-24 LAB — PREPARE RBC (CROSSMATCH)

## 2015-09-24 LAB — LACTIC ACID, PLASMA
LACTIC ACID, VENOUS: 2.6 mmol/L — AB (ref 0.5–2.0)
Lactic Acid, Venous: 1.4 mmol/L (ref 0.5–2.0)

## 2015-09-24 LAB — FOLATE: Folate: 18 ng/mL (ref >5.9)

## 2015-09-24 MED ORDER — PANTOPRAZOLE SODIUM 40 MG IV SOLR
40.0000 mg | INTRAVENOUS | Status: DC
  Administered 2015-09-24 – 2015-09-25 (×2): 40 mg via INTRAVENOUS
  Filled 2015-09-24 (×2): qty 40

## 2015-09-24 MED ORDER — SODIUM CHLORIDE 0.9 % IV SOLN: Freq: Once | INTRAVENOUS | Status: DC

## 2015-09-24 MED ORDER — SODIUM CHLORIDE 0.9% FLUSH
10.0000 mL | INTRAVENOUS | Status: DC | PRN
  Administered 2015-09-24: 10 mL
  Administered 2015-10-08: 30 mL
  Filled 2015-09-24 (×2): qty 40

## 2015-09-24 MED ORDER — TRACE MINERALS CR-CU-MN-SE-ZN 10-1000-500-60 MCG/ML IV SOLN
INTRAVENOUS | Status: DC
  Administered 2015-09-24: 18:00:00 via INTRAVENOUS
  Filled 2015-09-24: qty 1992

## 2015-09-24 MED ORDER — FAT EMULSION 20 % IV EMUL
240.0000 mL | INTRAVENOUS | Status: DC
  Administered 2015-09-24: 240 mL via INTRAVENOUS
  Filled 2015-09-24: qty 250

## 2015-09-24 MED ORDER — ALTEPLASE 2 MG IJ SOLR
2.0000 mg | Freq: Once | INTRAMUSCULAR | Status: DC
  Filled 2015-09-24: qty 2

## 2015-09-24 MED ORDER — DEXTROSE 5 % IV SOLN
1.0000 g | Freq: Three times a day (TID) | INTRAVENOUS | Status: DC
  Administered 2015-09-24 – 2015-09-25 (×2): 1 g via INTRAVENOUS
  Filled 2015-09-24 (×4): qty 1

## 2015-09-24 MED ORDER — VANCOMYCIN HCL 10 G IV SOLR
1250.0000 mg | Freq: Two times a day (BID) | INTRAVENOUS | Status: DC
  Administered 2015-09-24 – 2015-09-25 (×2): 1250 mg via INTRAVENOUS
  Filled 2015-09-24 (×3): qty 1250

## 2015-09-24 MED ORDER — HYDROMORPHONE HCL 1 MG/ML IJ SOLN
1.0000 mg | INTRAMUSCULAR | Status: DC | PRN
  Administered 2015-09-25 – 2015-10-10 (×2): 1 mg via INTRAVENOUS
  Filled 2015-09-24 (×2): qty 1

## 2015-09-24 MED ORDER — SODIUM CHLORIDE 0.45 % IV SOLN
INTRAVENOUS | Status: AC
  Administered 2015-09-24: 75 mL/h via INTRAVENOUS

## 2015-09-24 MED ORDER — INSULIN ASPART 100 UNIT/ML ~~LOC~~ SOLN
0.0000 [IU] | SUBCUTANEOUS | Status: DC
Start: 2015-09-24 — End: 2015-10-10
  Administered 2015-09-25 (×2): 3 [IU] via SUBCUTANEOUS
  Administered 2015-09-25 (×2): 1 [IU] via SUBCUTANEOUS
  Administered 2015-09-25: 2 [IU] via SUBCUTANEOUS
  Administered 2015-09-26 – 2015-10-04 (×19): 1 [IU] via SUBCUTANEOUS
  Administered 2015-10-04: 2 [IU] via SUBCUTANEOUS
  Administered 2015-10-04 – 2015-10-05 (×3): 1 [IU] via SUBCUTANEOUS
  Administered 2015-10-05 (×2): 2 [IU] via SUBCUTANEOUS
  Administered 2015-10-05: 1 [IU] via SUBCUTANEOUS
  Administered 2015-10-05: 2 [IU] via SUBCUTANEOUS
  Administered 2015-10-06 (×2): 1 [IU] via SUBCUTANEOUS
  Administered 2015-10-06: 2 [IU] via SUBCUTANEOUS
  Administered 2015-10-06: 3 [IU] via SUBCUTANEOUS
  Administered 2015-10-06: 2 [IU] via SUBCUTANEOUS
  Administered 2015-10-06: 1 [IU] via SUBCUTANEOUS
  Administered 2015-10-06 – 2015-10-07 (×2): 2 [IU] via SUBCUTANEOUS
  Administered 2015-10-07 (×2): 3 [IU] via SUBCUTANEOUS
  Administered 2015-10-07: 2 [IU] via SUBCUTANEOUS
  Administered 2015-10-08: 3 [IU] via SUBCUTANEOUS
  Administered 2015-10-08: 1 [IU] via SUBCUTANEOUS
  Administered 2015-10-08: 2 [IU] via SUBCUTANEOUS
  Administered 2015-10-08 – 2015-10-09 (×5): 3 [IU] via SUBCUTANEOUS
  Administered 2015-10-09 (×2): 2 [IU] via SUBCUTANEOUS
  Administered 2015-10-09 (×2): 3 [IU] via SUBCUTANEOUS
  Administered 2015-10-10: 1 [IU] via SUBCUTANEOUS
  Administered 2015-10-10 (×2): 3 [IU] via SUBCUTANEOUS

## 2015-09-24 MED ORDER — ACETAMINOPHEN 650 MG RE SUPP
650.0000 mg | RECTAL | Status: DC | PRN
  Administered 2015-09-24 – 2015-09-25 (×3): 650 mg via RECTAL
  Filled 2015-09-24 (×4): qty 1

## 2015-09-24 MED ORDER — SODIUM CHLORIDE 0.9 % IV BOLUS (SEPSIS)
500.0000 mL | Freq: Once | INTRAVENOUS | Status: AC
  Administered 2015-09-24: 500 mL via INTRAVENOUS

## 2015-09-24 MED ORDER — HYDROCODONE-ACETAMINOPHEN 5-325 MG PO TABS: 1.0000 | ORAL_TABLET | Freq: Four times a day (QID) | ORAL | Status: DC | PRN

## 2015-09-24 NOTE — Progress Notes (Signed)
PARENTERAL NUTRITION CONSULT NOTE  and ABX consult  Pharmacy Consult for TPN and vanc/cefepime Indication: Dysphagia/laryngeal and recent gastric and duod ulcers unable to have PEG acutely  No Known Allergies  Patient Measurements: Height: _0  (160 cm) (Simultaneous filing. User may not have seen previous data.) Weight: 200 lb 9.9 oz (91 kg) (Simultaneous filing. User may not have seen previous data.) IBW/kg (Calculated) : 52.4 Vital Signs: Temp: 99.9 F (37.7 C) (02/11 1218) Temp Source: Oral (02/11 1218) BP: 125/49 mmHg (02/11 1218) Pulse Rate: 40 (02/11 1218) Intake/Output from previous day: 02/10 0701 - 02/11 0700 In: 904.2 [I.V.:854.2; IV Piggyback:50] Out: -  Intake/Output from this shift: Total I/O In: 0  Out: 750 [Urine:750]  Labs:  Recent Labs  09/23/15 1422 09/24/15 0530  WBC 28.0* 17.9*  HGB 9.4* 7.1*  HCT 31.1* 23.1*  PLT 248 186     Recent Labs  09/23/15 1812 09/24/15 0530  NA 146* 147*  K 4.3 3.8  CL 107 121*  CO2 21* 20*  GLUCOSE 393* 177*  BUN 86* 59*  CREATININE 1.83* 1.19*  CALCIUM 8.5* 8.0*  PROT 6.6  --   ALBUMIN 1.8*  --   AST 31  --   ALT 39  --   ALKPHOS 157*  --   BILITOT 0.6  --    Estimated Creatinine Clearance: 54.5 mL/min (by C-G formula based on Cr of 1.19).    Recent Labs  09/23/15 2356 09/24/15 0733 09/24/15 1217  GLUCAP 232* 130* 44    Medical History: Past Medical History  Diagnosis Date  . Thyroid disease   . Hypertension   . Neck mass hospitalized 08/29/2015  . Type II diabetes mellitus ([place])   . Left kidney mass   . Anemia, chronic disease     Medications: home TPN  Insulin Requirements in the past 24 hours:  3 Units SSI while NPO, had order for 2 units novolog tidwm Current Nutrition:  NPO, on TPN PTA at 80 ml/hr  Assessment: 60 yo on TPN PTA. Pharmacy consulted to dose TPN for Dysphagia/laryngeal and recent gastric and duod ulcers unable to have PEG acutely.    GI: on TPN PTA, for  dysphagia, unable to place PEG due to gastric and duodenal ulcers, IV PPI  Endocrine: DM, SSI, no insulin listed in home TPN formula; on 2 Units novolg TID w meals PTA  Lytes:  Na slightly high at 147, K 3.8  Renal: creat 1.83>>1.19   Hepatic: alb 1.8, alk phos 157  ID: HCAP vs tracheobronchitis, WBC 28>17.9, Creat 1.83>1.19, LA 2.6>1.4; T max 100 Zosyn x 1 2/10 vanc 2/10>> Cefepime 2/11>>  2/10 BCx2>>gp  Cocci in chains in 2/2 BC 2/10 Ucx>> 2/11 flu neg 2/10 S pneum Uag>> 2/10 HIV    Nutritional Goals: per RN 09/24/15 1700-1900  kCal, 90-105 grams of protein per day  Plan:  - clinimix E 5/15 at 83 ml/hr plus IVFE at 10 ml/hr to provide ~ 100 gm protein and ~ 1894 kcals for 100% support (~ same as home TPN) - change SSI to q4h - DC .45 NS at 74 once TPN started - increase vancomycin to 1250 mg IV q12h - change cefepime to 1 gm q8h - f/u ID GPC in [place]  - TPN labs ordered  Eudelia Bunch, Pharm.D. [phone] 09/24/2015 3:26 PM

## 2015-09-24 NOTE — Progress Notes (Signed)
[facility] PROGRESS NOTE  Ashley Ramsey [id number] DOB: 06/10/1956 DOA: 09/23/2015 PCP: Arnoldo Morale, MD  Assessment/Plan: 1. HCAP vs Tracheobronchitis -continue broad spectrum Abx -nebs PRN -suction PRN -FU Blood Cx -WBC improving  2. Anemia  -acute on chronic, multifactorial  -last month found to have proximal stomach and duodenal ulcer, status post EGD on 09/04/2015, also had Gastroduodenal Artery was coiled/embolized by IR on 09-07-15, required 5units PRBC then -keep on PPI, Transfuse 1 unit PRBC now -FU anemia panel, no overt bleeding now, monitor  3. Metastatic Invasive Sq Cell Laryngeal CA : -seen by  ENT & oncology last month, underwent laryngoscopy/ biopsy on 1/20 -Followed by Dr.ROsen and he recommended FU with ENT at [facility] for Eval for Surgery -s/p tracheostomy placement by ENT on 09/05/2015 -Seen by Oncology Dr.Gorsuch and Rad Onc, and Palliative consult completed -Pt and family wants full aggressive scope of Rx -per Dr.Gorsuch, Patient's prognosis is extremely poor, poor candidate for chemotherapy, also most likely has renal cancer. -ENT Dr Constance Holster recommended outpatient follow-up with [facility]NT once acute issues have resolved per ENT may be in the next 1-2 weeks. Will also follow with Dr. Constance Holster locally. She will also follow with oncologist Dr. Alvy Bimler locally. -No PEG tube as she has stomach and duodenal ulcers now -continue PICC line for TNA.  -SLP eval for PMV and continued swallow eval.  -will need Gi eval in about a month for PEG, Fu with Dr.Mann  4. Left renal mass: Highly suspicious for renal cell carcinoma.  -case d/w Dr. Ree Kida last admission, recommended outpatient follow-up with him in the office for consideration of nephrectomy in the future  5. DM -SSI for now  6. Morbid obesity:  -nutrition via TPN now  7. DVT proph:  -SCDs, due to anemia, recent massive GI bleed  Code Status: FUll Code Family Communication: none at  bedside Disposition Plan: Keep in SDU   HPI/Subjective: Feels better, lips words  Objective: Filed Vitals:   09/24/15 0750 09/24/15 1020  BP: 123/66 114/38  Pulse: 123 81  Temp:    Resp: 22 21    Intake/Output Summary (Last 24 hours) at 09/24/15 1143 Last data filed at 09/24/15 1021  Gross per 24 hour  Intake 904.17 ml  Output    750 ml  Net 154.17 ml   Filed Weights   09/23/15 1606 09/23/15 2253  Weight: 92.987 kg (205 lb) 91 kg (200 lb 9.9 oz)    Exam:   General:  AAOx3, chronically ill appearing, no distress  HEENT: trach site clean  Cardiovascular: S1S2/RRR  Respiratory: conducted upper airway sounds  Abdomen: soft, NT, BS present  Musculoskeletal: no edema c/c   Data Reviewed: Basic Metabolic Panel:  Recent Labs Lab 09/23/15 1812 09/24/15 0530  NA 146* 147*  K 4.3 3.8  CL 107 121*  CO2 21* 20*  GLUCOSE 393* 177*  BUN 86* 59*  CREATININE 1.83* 1.19*  CALCIUM 8.5* 8.0*   Liver Function Tests:  Recent Labs Lab 09/23/15 1812  AST 31  ALT 39  ALKPHOS 157*  BILITOT 0.6  PROT 6.6  ALBUMIN 1.8*   No results for input(s): LIPASE, AMYLASE in the last 168 hours. No results for input(s): AMMONIA in the last 168 hours. CBC:  Recent Labs Lab 09/23/15 1422 09/24/15 0530  WBC 28.0* 17.9*  NEUTROABS 24.1*  --   HGB 9.4* 7.1*  HCT 31.1* 23.1*  MCV 88.4 85.9  PLT 248 186   Cardiac Enzymes: No results for  input(s): CKTOTAL, CKMB, CKMBINDEX, TROPONINI in the last 168 hours. BNP (last 3 results) No results for input(s): BNP in the last 8760 hours.  ProBNP (last 3 results) No results for input(s): PROBNP in the last 8760 hours.  CBG:  Recent Labs Lab 09/23/15 2356  GLUCAP 232*    Recent Results (from the past 240 hour(s))  Blood Culture (routine x 2)     Status: None (Preliminary result)   Collection Time: 09/23/15 11:10 PM  Result Value Ref Range Status   Specimen Description BLOOD LEFT HAND  Final   Special Requests IN  PEDIATRIC BOTTLE 3CC  Final   Culture NO GROWTH < 12 HOURS  Final   Report Status PENDING  Incomplete  Blood Culture (routine x 2)     Status: None (Preliminary result)   Collection Time: 09/23/15 11:11 PM  Result Value Ref Range Status   Specimen Description BLOOD RIGHT HAND  Final   Special Requests IN PEDIATRIC BOTTLE 1CC  Final   Culture NO GROWTH < 12 HOURS  Final   Report Status PENDING  Incomplete     Studies: Dg Chest Port 1 View  09/23/2015  CLINICAL DATA:  60 year old female with shortness of breath and cough EXAM: PORTABLE CHEST 1 VIEW COMPARISON:  Radiograph dated 09/14/2015 FINDINGS: Right-sided PICC in stable positioning. Tracheostomy with tip above the carina in stable positioning. Single-view of the chest demonstrate a focal area of hazy density at the left lung base, likely atelectatic changes. Pneumonia is less likely but not excluded. Clinical correlation is recommended. The lungs are hypovolemic. No pleural effusion or pneumothorax. Stable cardiac silhouette. No acute osseous pathology. IMPRESSION: Minimal left lung base atelectatic changes versus less likely pneumonia. Clinical correlation recommended. Electronically Signed   By: Anner Crete M.D.   On: 09/23/2015 18:30    Scheduled Meds: . sodium chloride   Intravenous Once  . ceFEPime (MAXIPIME) IV  2 g Intravenous Q12H  . heparin  5,000 Units Subcutaneous 3 times per day  . insulin aspart  0-5 Units Subcutaneous QHS  . insulin aspart  0-9 Units Subcutaneous TID WC  . insulin aspart  2 Units Subcutaneous TID WC  . pantoprazole (PROTONIX) IV  40 mg Intravenous Q24H  . vancomycin  1,250 mg Intravenous Q24H   Continuous Infusions: . sodium chloride 125 mL/hr at 09/23/15 2310   Antibiotics Given (last 72 hours)    Date/Time Action Medication Dose Rate   09/24/15 0047 Given   ceFEPIme (MAXIPIME) 2 g in dextrose 5 % 50 mL IVPB 2 g 100 mL/hr      Principal Problem:   Severe sepsis (HCC) Active Problems:    Type 2 diabetes mellitus without complication, without long-term current use of insulin ([place])   AKI (acute kidney injury) ([place])   HCAP (healthcare-associated pneumonia)   DM2 (diabetes mellitus, type 2) ([place])    Time spent: 60min    Ashley Ramsey  [facility] Pager [phone]. If 7PM-7AM, please contact night-coverage at [email or web address], password [facility] 09/24/2015, 11:43 AM  LOS: 1 day

## 2015-09-24 NOTE — Progress Notes (Signed)
CRITICAL VALUE ALERT  Critical value received:  Lactic Acid 2.6  Date of notification:  09/24/2015  Time of notification:  0016  Critical value read back:Yes.    Nurse who received alert:  Annice Pih RN  MD notified (1st page):  [place]  Time of first page:  0022  MD notified (2nd page):  Time of second page:  Responding MD:  Walden Field [facility]   Time MD responded:  [phone]

## 2015-09-24 NOTE — Progress Notes (Signed)
Initial Nutrition Assessment  DOCUMENTATION CODES:  Obesity unspecified  INTERVENTION:  TPN per pharmacy  NUTRITION DIAGNOSIS:  Increased nutrient needs related to severe acute illness, cancer and cancer related treatments as evidenced by estimated nutritional requirements for the conditions  GOAL:  Patient will meet greater than or equal to 90% of their needs  MONITOR:  Labs, Weight trends, I & O's, Diet advancement  REASON FOR ASSESSMENT:  Consult TPN/TNA (relatively new Home TPN)  ASSESSMENT:  60 y/o female PMHx with h/o DM2, HTN, laryngeal cancer, s/p trach, currently on TPN for nutrition. Patient presents with increased confusion. WBC yesterday was 28k. Workup shows severe sepsis possible related to HCAP or potentially PICC line. Also has AKI.   Pt's home TNA formula provides: 100 g Pro 300 g. Carb 50 g Fat Multitrace 5 concentrate 10 ml  Total kcals: 1900 kcals, 100 g Pro   Meets estimated requirements. No reccommended changes   Briefly spoke with pt. She denies any having trouble with her TPN at home. No N/v. Has "a little" constipation and diarrhea. She did not attempt to have any PO intake while at home.   Labs reviewed: Hyper-natremia, chloremia. Calcemia, glycemia. Hypo-capnia. AKI. Leukocytosis, anemia  Diet Order:  Diet NPO time specified TPN (CLINIMIX-E) Adult  Skin:  Reviewed, no issues  Last BM:  PTA  Height:  Ht Readings from Last 1 Encounters:  09/23/15 5\' 3"  (1.6 m)   Weight:  Wt Readings from Last 1 Encounters:  09/23/15 200 lb 9.9 oz (91 kg)   Wt Readings from Last 10 Encounters:  09/23/15 200 lb 9.9 oz (91 kg)  09/23/15 205 lb (92.987 kg)  09/16/15 205 lb (92.987 kg)  08/29/15 250 lb (113.399 kg)  08/21/13 236 lb 3.2 oz (107.14 kg)  07/17/13 247 lb (112.038 kg)   Ideal Body Weight:  52.27 kg  BMI:  Body mass index is 35.55 kg/(m^2).  Estimated Nutritional Needs:  Kcal:  1700-1900 kcals Protein:  90-105 g (1.7-2 g/kg ibw) Fluid:   1.8-2 liters  EDUCATION NEEDS:  No education needs identified at this time  Burtis Junes RD, LDN Nutrition Pager: [phone] 09/24/2015 3:59 PM

## 2015-09-24 NOTE — Progress Notes (Signed)
Family here to visit. Daughter says her mama ( pt ) has to have something for pain. Asked pt all day if she had pain & she said no. Now she says she does have some Pain Rt flank.

## 2015-09-24 NOTE — Progress Notes (Signed)
Call to DR Broadus John - made aware of call from lab 1 bottle of first set of blood cultures positive for gram + cocci, 2nd set of blood cultures 1 bottle positive for Gram + cocci in chains - returned call immediately says she  is already on appropriate antibiotics.

## 2015-09-24 NOTE — Progress Notes (Signed)
Utilization review completed.  

## 2015-09-25 ENCOUNTER — Inpatient Hospital Stay (HOSPITAL_COMMUNITY): Payer: Medicaid Other

## 2015-09-25 LAB — COMPREHENSIVE METABOLIC PANEL
ALBUMIN: 1.5 g/dL — AB (ref 3.5–5.0)
ALT: 36 U/L (ref 14–54)
AST: 28 U/L (ref 15–41)
Alkaline Phosphatase: 144 U/L — ABNORMAL HIGH (ref 38–126)
Anion gap: 8 (ref 5–15)
BILIRUBIN TOTAL: 0.1 mg/dL — AB (ref 0.3–1.2)
BUN: 42 mg/dL — AB (ref 6–20)
CO2: 20 mmol/L — ABNORMAL LOW (ref 22–32)
CREATININE: 1.12 mg/dL — AB (ref 0.44–1.00)
Calcium: 8.5 mg/dL — ABNORMAL LOW (ref 8.9–10.3)
Chloride: 126 mmol/L — ABNORMAL HIGH (ref 101–111)
GFR calc Af Amer: >60 mL/min (ref >60)
GFR calc non Af Amer: 53 mL/min — ABNORMAL LOW (ref >60)
GLUCOSE: 216 mg/dL — AB (ref 65–99)
POTASSIUM: 3.8 mmol/L (ref 3.5–5.1)
Sodium: 154 mmol/L — ABNORMAL HIGH (ref 135–145)
TOTAL PROTEIN: 5.3 g/dL — AB (ref 6.5–8.1)

## 2015-09-25 LAB — URINE CULTURE

## 2015-09-25 LAB — GLUCOSE, CAPILLARY
GLUCOSE-CAPILLARY: 117 mg/dL — AB (ref 65–99)
GLUCOSE-CAPILLARY: 149 mg/dL — AB (ref 65–99)
GLUCOSE-CAPILLARY: 164 mg/dL — AB (ref 65–99)
GLUCOSE-CAPILLARY: 210 mg/dL — AB (ref 65–99)
Glucose-Capillary: 168 mg/dL — ABNORMAL HIGH (ref 65–99)
Glucose-Capillary: 240 mg/dL — ABNORMAL HIGH (ref 65–99)

## 2015-09-25 LAB — CBC
HCT: 29.8 % — ABNORMAL LOW (ref 36.0–46.0)
HEMATOCRIT: 24.4 % — AB (ref 36.0–46.0)
HEMOGLOBIN: 9.5 g/dL — AB (ref 12.0–15.0)
Hemoglobin: 7.6 g/dL — ABNORMAL LOW (ref 12.0–15.0)
MCH: 26.8 pg (ref 26.0–34.0)
MCH: 27.1 pg (ref 26.0–34.0)
MCHC: 31.1 g/dL (ref 30.0–36.0)
MCHC: 31.9 g/dL (ref 30.0–36.0)
MCV: 85.9 fL (ref 78.0–100.0)
MCV: 84.9 fL (ref 78.0–100.0)
PLATELETS: 152 10*3/uL (ref 150–400)
Platelets: 146 10*3/uL — ABNORMAL LOW (ref 150–400)
RBC: 2.84 MIL/uL — ABNORMAL LOW (ref 3.87–5.11)
RBC: 3.51 MIL/uL — AB (ref 3.87–5.11)
RDW: 15.5 % (ref 11.5–15.5)
RDW: 15.2 % (ref 11.5–15.5)
WBC: 15.9 10*3/uL — ABNORMAL HIGH (ref 4.0–10.5)
WBC: 17.8 10*3/uL — ABNORMAL HIGH (ref 4.0–10.5)

## 2015-09-25 LAB — MAGNESIUM: Magnesium: 1.8 mg/dL (ref 1.7–2.4)

## 2015-09-25 LAB — BASIC METABOLIC PANEL
Anion gap: 12 (ref 5–15)
BUN: 38 mg/dL — AB (ref 6–20)
CHLORIDE: 123 mmol/L — AB (ref 101–111)
CO2: 20 mmol/L — ABNORMAL LOW (ref 22–32)
CREATININE: 1.19 mg/dL — AB (ref 0.44–1.00)
Calcium: 9 mg/dL (ref 8.9–10.3)
GFR calc Af Amer: 57 mL/min — ABNORMAL LOW (ref >60)
GFR, EST NON AFRICAN AMERICAN: 49 mL/min — AB (ref >60)
GLUCOSE: 214 mg/dL — AB (ref 65–99)
POTASSIUM: 3.7 mmol/L (ref 3.5–5.1)
Sodium: 155 mmol/L — ABNORMAL HIGH (ref 135–145)

## 2015-09-25 LAB — BLOOD GAS, ARTERIAL
ACID-BASE DEFICIT: 6.2 mmol/L — AB (ref 0.0–2.0)
Bicarbonate: 17 mEq/L — ABNORMAL LOW (ref 20.0–24.0)
DRAWN BY: 105521
FIO2: 0.28
O2 Saturation: 93.9 %
PH ART: 7.452 — AB (ref 7.350–7.450)
Patient temperature: 98.6
TCO2: 17.8 mmol/L (ref 0–100)
pCO2 arterial: 24.7 mmHg — ABNORMAL LOW (ref 35.0–45.0)
pO2, Arterial: 66.3 mmHg — ABNORMAL LOW (ref 80.0–100.0)

## 2015-09-25 LAB — TRIGLYCERIDES: TRIGLYCERIDES: 271 mg/dL — AB (ref <150)

## 2015-09-25 LAB — PREPARE RBC (CROSSMATCH)

## 2015-09-25 LAB — PHOSPHORUS: Phosphorus: 2.5 mg/dL (ref 2.5–4.6)

## 2015-09-25 LAB — PREALBUMIN: Prealbumin: 5.2 mg/dL — ABNORMAL LOW (ref 18–38)

## 2015-09-25 MED ORDER — MAGNESIUM SULFATE IN D5W 10-5 MG/ML-% IV SOLN
1.0000 g | Freq: Once | INTRAVENOUS | Status: DC
  Filled 2015-09-25: qty 100

## 2015-09-25 MED ORDER — SODIUM CHLORIDE 0.9 % IV SOLN
3.0000 g | Freq: Four times a day (QID) | INTRAVENOUS | Status: DC
  Administered 2015-09-25 – 2015-09-28 (×13): 3 g via INTRAVENOUS
  Filled 2015-09-25 (×14): qty 3

## 2015-09-25 MED ORDER — FAT EMULSION 20 % IV EMUL
240.0000 mL | INTRAVENOUS | Status: DC
  Filled 2015-09-25: qty 250

## 2015-09-25 MED ORDER — ACETAMINOPHEN 650 MG RE SUPP
650.0000 mg | Freq: Once | RECTAL | Status: AC
  Administered 2015-09-25: 650 mg via RECTAL

## 2015-09-25 MED ORDER — SODIUM CHLORIDE 0.45 % IV SOLN
INTRAVENOUS | Status: DC
  Administered 2015-09-25: 17:00:00 via INTRAVENOUS

## 2015-09-25 MED ORDER — ACETAMINOPHEN 325 MG PO TABS: 650.0000 mg | ORAL_TABLET | Freq: Once | ORAL | Status: DC

## 2015-09-25 MED ORDER — POTASSIUM CHLORIDE 10 MEQ/100ML IV SOLN
10.0000 meq | Freq: Once | INTRAVENOUS | Status: AC
  Administered 2015-09-25: 10 meq via INTRAVENOUS
  Filled 2015-09-25: qty 100

## 2015-09-25 MED ORDER — MAGNESIUM SULFATE IN D5W 10-5 MG/ML-% IV SOLN
1.0000 g | Freq: Once | INTRAVENOUS | Status: AC
Start: 2015-09-25 — End: 2015-09-25
  Administered 2015-09-25: 1 g via INTRAVENOUS
  Filled 2015-09-25: qty 100

## 2015-09-25 MED ORDER — METOPROLOL TARTRATE 1 MG/ML IV SOLN
5.0000 mg | INTRAVENOUS | Status: DC | PRN
  Administered 2015-09-25: 5 mg via INTRAVENOUS
  Filled 2015-09-25: qty 5

## 2015-09-25 MED ORDER — PANTOPRAZOLE SODIUM 40 MG IV SOLR
40.0000 mg | Freq: Two times a day (BID) | INTRAVENOUS | Status: DC
  Administered 2015-09-25 – 2015-10-07 (×24): 40 mg via INTRAVENOUS
  Filled 2015-09-25 (×24): qty 40

## 2015-09-25 MED ORDER — POTASSIUM CHLORIDE 10 MEQ/50ML IV SOLN: 10.0000 meq | INTRAVENOUS | Status: DC

## 2015-09-25 MED ORDER — POTASSIUM CHLORIDE 10 MEQ/50ML IV SOLN
10.0000 meq | INTRAVENOUS | Status: DC
  Administered 2015-09-25: 10 meq via INTRAVENOUS
  Filled 2015-09-25: qty 50

## 2015-09-25 MED ORDER — ATROPINE SULFATE 1 % OP SOLN
2.0000 [drp] | Freq: Once | OPHTHALMIC | Status: AC
  Administered 2015-09-25: 2 [drp] via SUBLINGUAL
  Filled 2015-09-25: qty 2

## 2015-09-25 MED ORDER — TRACE MINERALS CR-CU-MN-SE-ZN 10-1000-500-60 MCG/ML IV SOLN
INTRAVENOUS | Status: DC
  Filled 2015-09-25: qty 1992

## 2015-09-25 MED ORDER — SODIUM CHLORIDE 0.9 % IV SOLN: 200.0000 mg | INTRAVENOUS | Status: DC

## 2015-09-25 MED ORDER — SODIUM CHLORIDE 0.9 % IV SOLN
100.0000 mg | INTRAVENOUS | Status: DC
  Administered 2015-09-26 – 2015-09-28 (×3): 100 mg via INTRAVENOUS
  Filled 2015-09-25 (×3): qty 100

## 2015-09-25 MED ORDER — METOPROLOL TARTRATE 1 MG/ML IV SOLN
5.0000 mg | INTRAVENOUS | Status: DC | PRN
  Administered 2015-09-25 – 2015-09-26 (×2): 5 mg via INTRAVENOUS
  Filled 2015-09-25 (×2): qty 5

## 2015-09-25 MED ORDER — SODIUM CHLORIDE 0.9 % IV SOLN
200.0000 mg | Freq: Once | INTRAVENOUS | Status: AC
  Administered 2015-09-25: 200 mg via INTRAVENOUS
  Filled 2015-09-25 (×2): qty 200

## 2015-09-25 MED ORDER — SODIUM CHLORIDE 0.9 % IV SOLN
Freq: Once | INTRAVENOUS | Status: AC
  Administered 2015-09-25: 08:00:00 via INTRAVENOUS

## 2015-09-25 NOTE — Progress Notes (Signed)
PARENTERAL NUTRITION CONSULT NOTE  and ABX consult  Pharmacy Consult for TPN and vanc/cefepime Indication: Dysphagia/laryngeal and recent gastric and duod ulcers unable to have PEG acutely  No Known Allergies  Patient Measurements: Height: '5\' 3"'$  (160 cm) (Simultaneous filing. User may not have seen previous data.) Weight: 200 lb 9.9 oz (91 kg) (Simultaneous filing. User may not have seen previous data.) IBW/kg (Calculated) : 52.4 Vital Signs: Temp: 98.8 F (37.1 C) (02/12 0700) Temp Source: Oral (02/12 0700) BP: 149/58 mmHg (02/12 0700) Pulse Rate: 111 (02/12 0740) Intake/Output from previous day: 02/11 0701 - 02/12 0700 In: 2319.6 [I.V.:300; Blood:395; IV Piggyback:600; TPN:1024.6] Out: 750 [Urine:750] Intake/Output from this shift:    Labs:  Recent Labs  09/23/15 1422 09/24/15 0530 09/25/15 0430  WBC 28.0* 17.9* 15.9*  HGB 9.4* 7.1* 7.6*  HCT 31.1* 23.1* 24.4*  PLT 248 186 152     Recent Labs  09/23/15 1812 09/24/15 0530 09/25/15 0430  NA 146* 147* 154*  K 4.3 3.8 3.8  CL 107 121* 126*  CO2 21* 20* 20*  GLUCOSE 393* 177* 216*  BUN 86* 59* 42*  CREATININE 1.83* 1.19* 1.12*  CALCIUM 8.5* 8.0* 8.5*  MG  --   --  1.8  PHOS  --   --  2.5  PROT 6.6  --  5.3*  ALBUMIN 1.8*  --  1.5*  AST 31  --  28  ALT 39  --  36  ALKPHOS 157*  --  144*  BILITOT 0.6  --  0.1*  PREALBUMIN  --   --  5.2*  TRIG  --   --  271*   Estimated Creatinine Clearance: 57.9 mL/min (by C-G formula based on Cr of 1.12).    Recent Labs  09/25/15 0019 09/25/15 0404 09/25/15 0717  GLUCAP 149* 168* 210*   Medications: home TPN  Insulin Requirements in the past 24 hours:  5 Units SSI, had order for 2 units novolog tidwm previously  Current Nutrition:  NPO TPN PTA at 83 ml/hr + IVFE at 10 ml/hr  Assessment: 60 yo on TPN PTA. Pharmacy consulted to dose TPN for Dysphagia/laryngeal and recent gastric and duod ulcers unable to have PEG acutely.    GI: on TPN PTA, for dysphagia,  unable to place PEG due to gastric and duodenal ulcers, IV PPI  Endocrine:   PMH  DM2, SSI, no insulin listed in home TPN formula; on 2 Units novolg TID w meals PTA; CBGs 149, 168 and 210 on TPN; thyroid dz  Lytes:  Na elevated at 154,  K 3.8, phos 2.5, mag 1.8  Renal: creat 1.83>>1.12, BUN 86>59>42  Hepatic: alb 1.5,  alk phos 144, prealbumin 5.2, trig elevated at 271  ID: HCAP vs tracheobronchitis; bacteremia w/ 2/2 BC + GPC in chains, WBC 28>17.9>15.9, Creat 1.83>1.12, LA 2.6>1.4; T max 100  Zosyn x 1 2/10 vanc 2/10>> Cefepime 2/11>>  2/10 BCx2>>gp  Cocci in chains in 2/2 BC 2/10 Ucx>> 2/11 flu neg 2/10 S pneum Uag>> 2/10 HIV 2/11 sputum> yest, GNR, CPC  Nutritional Goals: per RN 09/24/15 1700-1900  kCal, 90-105 grams of protein per day  Plan:  - continue clinimix E 5/15 at 83 ml/hr plus IVFE at 10 ml/hr to provide ~ 100 gm protein and ~ 1894 kcals for 100% support (~ same as home TPN) - 1 gm mag followed by 2 runs K - add 20 units of insulin to TPN and continue SSI q4h - continue vancomycin 1250 mg IV q12h -  continue cefepime to 1 gm q8h - f/u ID [facility] in [facility]  - TPN labs ordered  Eudelia Bunch, Pharm.D. [phone] 09/25/2015 8:29 AM

## 2015-09-25 NOTE — Progress Notes (Addendum)
[facility] PROGRESS NOTE  Ashley Ramsey [id number] DOB: 07/10/1956 DOA: 09/23/2015 PCP: Arnoldo Morale, MD  Assessment/Plan: 1. Sepsis from HCAP vs Tracheobronchitis -Blood Cx with GPC in pairs-likely Strep or Enterococcus -continue broad spectrum Abx -nebs PRN -suction PRN -repeat Blood Cx -WBC improving  2. Anemia  -acute on chronic, multifactorial  -2 weeks ago found to have proximal stomach and duodenal ulcer, status post EGD on 09/04/2015, also had Gastroduodenal Artery was coiled/embolized by IR on 09-07-15, required 5units PRBC then -keep on PPI, Transfused 1 unit PRBC 2/11, will give another Unit Today, if blood loss doesn't slow down will ask Gi to eval -anemia panel with Iron deficiency -will give Iv Iron this admission  3.  Invasive Sq Cell Laryngeal CA : -seen by  ENT & oncology last month, underwent laryngoscopy/ biopsy on 1/20 -Followed by Dr.ROsen and he recommended FU with ENT Dr.James Vicie Mutters at [facility] for Eval for Surgery -s/p tracheostomy placement by ENT on 09/05/2015 -Seen by Oncology Dr.Gorsuch and Rad Onc, and Palliative consult completed -Pt and family wants full aggressive scope of Rx -per Dr.Gorsuch, Patient's prognosis is extremely poor, poor candidate for chemotherapy, also most likely has renal cancer. -ENT Dr Constance Holster recommended outpatient follow-up with [facility] ENT once acute issues have resolved per ENT may be in the next 1-2 weeks. -No PEG tube as she has stomach and duodenal ulcers now -continue PICC line for TNA.  -SLP eval for PMV and continued swallow eval.  -will need Gi eval in about a month for PEG, Fu with Dr.Mann  4. Left renal mass: Highly suspicious for renal cell carcinoma.  -case d/w Dr. Ree Kida last admission, recommended outpatient follow-up with him in the office for consideration of nephrectomy in the future  5. DM -SSI for now  6. Morbid obesity:  -nutrition via TPN now  7. DVT proph:  -SCDs, due to  anemia, recent massive GI bleed  Code Status: FUll Code Family Communication: none at bedside, will d/w daughter Disposition Plan: Keep in SDU   HPI/Subjective: Feels better, lips words  Objective: Filed Vitals:   09/25/15 1015 09/25/15 1044  BP:  157/56  Pulse:    Temp: 98.8 F (37.1 C) 98.6 F (37 C)  Resp:      Intake/Output Summary (Last 24 hours) at 09/25/15 1107 Last data filed at 09/25/15 1044  Gross per 24 hour  Intake 2469.55 ml  Output      0 ml  Net 2469.55 ml   Filed Weights   09/23/15 1606 09/23/15 2253  Weight: 92.987 kg (205 lb) 91 kg (200 lb 9.9 oz)    Exam:   General:  AAOx3, chronically ill appearing, no distress  HEENT: around trach some phlegm noted  Cardiovascular: S1S2/RRR  Respiratory: conducted upper airway sounds  Abdomen: soft, NT, BS present  Musculoskeletal: no edema c/c   Data Reviewed: Basic Metabolic Panel:  Recent Labs Lab 09/23/15 1812 09/24/15 0530 09/25/15 0430  NA 146* 147* 154*  K 4.3 3.8 3.8  CL 107 121* 126*  CO2 21* 20* 20*  GLUCOSE 393* 177* 216*  BUN 86* 59* 42*  CREATININE 1.83* 1.19* 1.12*  CALCIUM 8.5* 8.0* 8.5*  MG  --   --  1.8  PHOS  --   --  2.5   Liver Function Tests:  Recent Labs Lab 09/23/15 1812 09/25/15 0430  AST 31 28  ALT 39 36  ALKPHOS 157* 144*  BILITOT 0.6 0.1*  PROT 6.6 5.3*  ALBUMIN 1.8*  1.5*   No results for input(s): LIPASE, AMYLASE in the last 168 hours. No results for input(s): AMMONIA in the last 168 hours. CBC:  Recent Labs Lab 09/23/15 1422 09/24/15 0530 09/25/15 0430  WBC 28.0* 17.9* 15.9*  NEUTROABS 24.1*  --   --   HGB 9.4* 7.1* 7.6*  HCT 31.1* 23.1* 24.4*  MCV 88.4 85.9 85.9  PLT 248 186 152   Cardiac Enzymes: No results for input(s): CKTOTAL, CKMB, CKMBINDEX, TROPONINI in the last 168 hours. BNP (last 3 results) No results for input(s): BNP in the last 8760 hours.  ProBNP (last 3 results) No results for input(s): PROBNP in the last 8760  hours.  CBG:  Recent Labs Lab 09/24/15 1546 09/24/15 1954 09/25/15 0019 09/25/15 0404 09/25/15 0717  GLUCAP 77 97 149* 168* 210*    Recent Results (from the past 240 hour(s))  Urine culture     Status: None (Preliminary result)   Collection Time: 09/23/15  6:40 PM  Result Value Ref Range Status   Specimen Description URINE, CATHETERIZED  Final   Special Requests NONE  Final   Culture TOO YOUNG TO READ  Final   Report Status PENDING  Incomplete  Blood Culture (routine x 2)     Status: None (Preliminary result)   Collection Time: 09/23/15 11:10 PM  Result Value Ref Range Status   Specimen Description BLOOD LEFT HAND  Final   Special Requests IN PEDIATRIC BOTTLE 3CC  Final   Culture  Setup Time   Final    GRAM POSITIVE COCCI IN CHAINS PEDIATRIC BOTTLE CRITICAL RESULT CALLED TO, READ BACK BY AND VERIFIED WITH: T HOOD,RN AT 1436 09/24/15 BY L BENFIELD    Culture   Final    ENTEROCOCCUS SPECIES YEAST CRITICAL RESULT CALLED TO, READ BACK BY AND VERIFIED WITH: H MILLS,RN AT 1101 09/25/15 BY L BENFIELD    Report Status PENDING  Incomplete  Blood Culture (routine x 2)     Status: None (Preliminary result)   Collection Time: 09/23/15 11:11 PM  Result Value Ref Range Status   Specimen Description BLOOD RIGHT HAND  Final   Special Requests IN PEDIATRIC BOTTLE 1CC  Final   Culture  Setup Time   Final    GRAM POSITIVE COCCI IN CHAINS PEDIATRIC BOTTLE CRITICAL RESULT CALLED TO, READ BACK BY AND VERIFIED WITH: T HOOD,RN AT 1250 09/24/15 BY L BENFIELD    Culture   Final    ENTEROCOCCUS SPECIES YEAST CRITICAL RESULT CALLED TO, READ BACK BY AND VERIFIED WITH: H MILLS,RN AT 1101 09/25/15 BY L BENFIELD    Report Status PENDING  Incomplete  Culture, respiratory (NON-Expectorated)     Status: None (Preliminary result)   Collection Time: 09/24/15 12:37 AM  Result Value Ref Range Status   Specimen Description SPUTUM  Final   Special Requests SUCTIONED  Final   Gram Stain   Final     MODERATE WBC PRESENT, PREDOMINANTLY PMN FEW SQUAMOUS EPITHELIAL CELLS PRESENT FEW GRAM POSITIVE COCCI IN PAIRS FEW YEAST FEW GRAM NEGATIVE RODS Performed at [facility]    Culture PENDING  Incomplete   Report Status PENDING  Incomplete     Studies: Dg Chest Port 1 View  09/23/2015  CLINICAL DATA:  60 year old female with shortness of breath and cough EXAM: PORTABLE CHEST 1 VIEW COMPARISON:  Radiograph dated 09/14/2015 FINDINGS: Right-sided PICC in stable positioning. Tracheostomy with tip above the carina in stable positioning. Single-view of the chest demonstrate a focal area of hazy density  at the left lung base, likely atelectatic changes. Pneumonia is less likely but not excluded. Clinical correlation is recommended. The lungs are hypovolemic. No pleural effusion or pneumothorax. Stable cardiac silhouette. No acute osseous pathology. IMPRESSION: Minimal left lung base atelectatic changes versus less likely pneumonia. Clinical correlation recommended. Electronically Signed   By: Anner Crete M.D.   On: 09/23/2015 18:30    Scheduled Meds: . sodium chloride   Intravenous Once  . alteplase  2 mg Intracatheter Once  . alteplase  2 mg Intracatheter Once  . ceFEPime (MAXIPIME) IV  1 g Intravenous 3 times per day  . heparin  5,000 Units Subcutaneous 3 times per day  . insulin aspart  0-9 Units Subcutaneous 6 times per day  . magnesium sulfate 1 - 4 g bolus IVPB  1 g Intravenous Once  . pantoprazole (PROTONIX) IV  40 mg Intravenous Q24H  . potassium chloride  10 mEq Intravenous Q1 Hr x 2  . vancomycin  1,250 mg Intravenous Q12H   Continuous Infusions: . Marland KitchenTPN (CLINIMIX-E) Adult 83 mL/hr at 09/24/15 1753   And  . fat emulsion 240 mL (09/24/15 1753)  . Marland KitchenTPN (CLINIMIX-E) Adult     And  . fat emulsion     Antibiotics Given (last 72 hours)    Date/Time Action Medication Dose Rate   09/24/15 0047 Given   ceFEPIme (MAXIPIME) 2 g in dextrose 5 % 50 mL IVPB 2 g 100 mL/hr    09/24/15 1258 Given   ceFEPIme (MAXIPIME) 2 g in dextrose 5 % 50 mL IVPB 2 g 100 mL/hr   09/24/15 1632 Given   vancomycin (VANCOCIN) 1,250 mg in sodium chloride 0.9 % 250 mL IVPB 1,250 mg 166.7 mL/hr   09/24/15 2258 Given   ceFEPIme (MAXIPIME) 1 g in dextrose 5 % 50 mL IVPB 1 g 100 mL/hr   09/25/15 0452 Given   vancomycin (VANCOCIN) 1,250 mg in sodium chloride 0.9 % 250 mL IVPB 1,250 mg 166.7 mL/hr   09/25/15 [id number] Given   ceFEPIme (MAXIPIME) 1 g in dextrose 5 % 50 mL IVPB 1 g 100 mL/hr      Principal Problem:   Severe sepsis (HCC) Active Problems:   Type 2 diabetes mellitus without complication, without long-term current use of insulin ([place])   AKI (acute kidney injury) ([place])   HCAP (healthcare-associated pneumonia)   DM2 (diabetes mellitus, type 2) ([place])    Time spent: 65min    Jameela Michna  [facility] Pager [phone]. If 7PM-7AM, please contact night-coverage at [email or web address], password [facility] 09/25/2015, 11:07 AM  LOS: 2 days

## 2015-09-25 NOTE — Progress Notes (Signed)
Pharmacy Antibiotic Note  Ashley Ramsey is a 60 y.o. female admitted on 09/23/2015 with SOB and cough.  Pharmacy has been consulted for unasyn dosing for enterococcal bacteremia.  She was previously on vanc/cefepime for HCAP/sepsis.  Now to start on unasyn for enterococcal bacteremia and anidulofungin for fungemia. WBC 28>17.9>15.9, Creat 1.83>1.12, LA 2.6>1.4; T max 100  Plan: Unasyn 3 gm IV q6h anidulafugin 200 mg IV x 1 then 100 mg IV daily ID recs to pull PICC line and needs line holiday TEE to eval for endocarditis Repeat BC tomorrow  Height: 5\' 3"  (160 cm) (Simultaneous filing. User may not have seen previous data.) Weight: 200 lb 9.9 oz (91 kg) (Simultaneous filing. User may not have seen previous data.) IBW/kg (Calculated) : 52.4  Temp (24hrs), Avg:100.1 F (37.8 C), Min:98.6 F (37 C), Max:102.8 F (39.3 C)   Recent Labs Lab 09/23/15 1422 09/23/15 1812 09/23/15 1824 09/23/15 2310 09/24/15 0107 09/24/15 0530 09/25/15 0430  WBC 28.0*  --   --   --   --  17.9* 15.9*  CREATININE  --  1.83*  --   --   --  1.19* 1.12*  LATICACIDVEN  --   --  3.77* 2.6* 1.4  --   --     Estimated Creatinine Clearance: 57.9 mL/min (by C-G formula based on Cr of 1.12).    No Known Allergies  Zosyn x 1 2/10 vanc 2/10>>2/12 Cefepime 2/11>>2/12 Unasyn 2/12>> Anidulofungin 2/12>>  2/10 BCx2>>enterococcus sp, Yeast in 2/2 2/10 Ucx>> 2/11 flu neg 2/10 S pneum Uag>> 2/10 HIV 2/11 sputum> yest, GNR, CPC 2/12 BCx2>>   Thank you for allowing pharmacy to be a part of this patient's care.  Eudelia Bunch, Pharm.D. QP:[phone] 09/25/2015 12:38 PM

## 2015-09-25 NOTE — Consult Note (Signed)
[facility] for Infectious Disease  Total days of antibiotics 3        Day 3 vanco        Day 2 cefepime               Reason for Consult: enterococcal bacteremia and fungemia   Referring Physician: Broadus John  Principal Problem:   Severe sepsis ([place]) Active Problems:   Type 2 diabetes mellitus without complication, without long-term current use of insulin (HCC)   AKI (acute kidney injury) ([place])   HCAP (healthcare-associated pneumonia)   DM2 (diabetes mellitus, type 2) (HCC)    HPI: Ashley Ramsey is a 60 y.o. 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 She was referred to community clinic due to leukocytosis of 21.7 increased from 13.8 the week prior. She denied fever, chills, possibly some diarrhea, family reports she had been declining in general since time of discharge but is especially more confused in past couple of days.she was referred to the ED for possible SIRS/sepsis on 2/10. ED labs reveal wbc of 28K with 86N. She appears to have increasing secretions from trach collar. CXR has left lung base atelectesis. Blood cx growing enterococcus and yeast. Leukocytosis is improving  Past Medical History  Diagnosis Date  . Thyroid disease   . Hypertension   . Neck mass hospitalized 08/29/2015  . Type II diabetes mellitus ([place])   . Left kidney mass   . Anemia, chronic disease     Allergies: No Known Allergies   MEDICATIONS: . sodium chloride   Intravenous Once  . alteplase  2 mg Intracatheter Once  . alteplase  2 mg Intracatheter Once  . ceFEPime (MAXIPIME) IV  1 g Intravenous 3 times per day  . heparin  5,000 Units Subcutaneous 3 times per day  . insulin aspart  0-9 Units Subcutaneous 6 times per day  . magnesium sulfate 1 - 4 g bolus IVPB  1 g Intravenous Once  . pantoprazole (PROTONIX) IV  40 mg Intravenous  Q24H  . potassium chloride  10 mEq Intravenous Q1 Hr x 2  . vancomycin  1,250 mg Intravenous Q12H    Social History  Substance Use Topics  . Smoking status: Former Smoker -- 0.00 packs/day for 34 years    Types: Cigarettes    Quit date: 07/14/2015  . Smokeless tobacco: Never Used  . Alcohol Use: No     Comment: 08/29/2015 "I drink 1/5th liquor one 1 day/week"    Family History  Problem Relation Age of Onset  . Diabetes Mother   . Hypertension Mother   . Heart disease Mother   . Diabetes Sister   . Hypertension Sister   . Cancer Daughter     Stomach    Review of Systems - Unable to articulate due to trach but nods her head to feeling better, no fever, chills, nightsweats, no abdominal pain, no rash  OBJECTIVE: Temp:  [98.6 F (37 C)-102.8 F (39.3 C)] 98.6 F (37 C) (02/12 1044) Pulse Rate:  [40-133] 111 (02/12 0740) Resp:  [17-35] 32 (02/12 0740) BP: (111-179)/(39-81) 157/56 mmHg (02/12 1044) SpO2:  [93 %-100 %] 100 % (02/12 0740) FiO2 (%):  [28 %-35 %] 28 % (02/12 1044) Physical Exam  Constitutional:  oriented to person, place, and time. appears well-developed and well-nourished. No distress.  HENT:  [place]/AT, PERRLA, no scleral icterus Trach: in place Mouth/Throat: Oropharynx is clear and moist. No oropharyngeal exudate.  Cardiovascular: Normal rate, regular rhythm and normal heart sounds. Exam reveals no gallop and no friction rub.  No murmur heard.  Pulmonary/Chest: Effort normal and breath sounds normal. Rhonchi bilaterally Neck = supple, no nuchal rigidity Abdominal: Soft. Bowel sounds are normal.  exhibits no distension. There is no tenderness.  Lymphadenopathy: no cervical adenopathy. No axillary adenopathy Neurological: alert and oriented to person, place, and time.  Skin: Skin is warm and dry. No rash noted. No erythema. No erythema to picc line Psychiatric: a normal mood and affect.  behavior is normal.  LABS: Results for orders placed or performed during  the hospital encounter of 09/23/15 (from the past 48 hour(s))  Comprehensive metabolic panel     Status: Abnormal   Collection Time: 09/23/15  6:12 PM  Result Value Ref Range   Sodium 146 (H) 135 - 145 mmol/L   Potassium 4.3 3.5 - 5.1 mmol/L   Chloride 107 101 - 111 mmol/L   CO2 21 (L) 22 - 32 mmol/L   Glucose, Bld 393 (H) 65 - 99 mg/dL   BUN 86 (H) 6 - 20 mg/dL   Creatinine, Ser 1.83 (H) 0.44 - 1.00 mg/dL   Calcium 8.5 (L) 8.9 - 10.3 mg/dL   Total Protein 6.6 6.5 - 8.1 g/dL   Albumin 1.8 (L) 3.5 - 5.0 g/dL   AST 31 15 - 41 U/L   ALT 39 14 - 54 U/L   Alkaline Phosphatase 157 (H) 38 - 126 U/L   Total Bilirubin 0.6 0.3 - 1.2 mg/dL   GFR calc non Af Amer 29 (L) >60 mL/min   GFR calc Af Amer 34 (L) >60 mL/min    Comment: (NOTE) The eGFR has been calculated using the CKD EPI equation. This calculation has not been validated in all clinical situations. eGFR's persistently <60 mL/min signify possible Chronic Kidney Disease.    Anion gap 18 (H) 5 - 15  I-Stat CG4 Lactic Acid, ED  (not at  [facility])     Status: Abnormal   Collection Time: 09/23/15  6:24 PM  Result Value Ref Range   Lactic Acid, Venous 3.77 (HH) 0.5 - 2.0 mmol/L   Comment NOTIFIED PHYSICIAN   Urinalysis, Routine w reflex microscopic (not at [facility])     Status: Abnormal   Collection Time: 09/23/15  6:40 PM  Result Value Ref Range   Color, Urine YELLOW YELLOW   APPearance CLOUDY (A) CLEAR   Specific Gravity, Urine 1.022 1.005 - 1.030   pH 5.0 5.0 - 8.0   Glucose, UA 500 (A) NEGATIVE mg/dL   Hgb urine dipstick NEGATIVE NEGATIVE   Bilirubin Urine NEGATIVE NEGATIVE   Ketones, ur NEGATIVE NEGATIVE mg/dL   Protein, ur NEGATIVE NEGATIVE mg/dL   Nitrite NEGATIVE NEGATIVE   Leukocytes, UA NEGATIVE NEGATIVE    Comment: MICROSCOPIC NOT DONE ON URINES WITH NEGATIVE PROTEIN, BLOOD, LEUKOCYTES, NITRITE, OR GLUCOSE <1000 mg/dL.  Urine culture     Status: None (Preliminary result)   Collection Time: 09/23/15  6:40 PM  Result Value  Ref Range   Specimen Description URINE, CATHETERIZED    Special Requests NONE    Culture TOO YOUNG TO READ    Report Status PENDING   HIV antibody     Status: None   Collection Time: 09/23/15  9:36 PM  Result Value Ref Range   HIV Screen 4th Generation wRfx Non Reactive Non Reactive  Comment: (NOTE) Performed At: [facility] [place], [place] [id number] Lindon Romp MD [id number]   Blood Culture (routine x 2)     Status: None (Preliminary result)   Collection Time: 09/23/15 11:10 PM  Result Value Ref Range   Specimen Description BLOOD LEFT HAND    Special Requests IN PEDIATRIC BOTTLE 3CC    Culture  Setup Time      GRAM POSITIVE COCCI IN CHAINS PEDIATRIC BOTTLE CRITICAL RESULT CALLED TO, READ BACK BY AND VERIFIED WITH: T HOOD,RN AT 1436 09/24/15 BY L BENFIELD    Culture ENTEROCOCCUS SPECIES    Report Status PENDING   Lactic acid, plasma     Status: Abnormal   Collection Time: 09/23/15 11:10 PM  Result Value Ref Range   Lactic Acid, Venous 2.6 (HH) 0.5 - 2.0 mmol/L    Comment: CRITICAL RESULT CALLED TO, READ BACK BY AND VERIFIED WITH: [facility] RN 09/24/2015 0016 JORDANS   Blood Culture (routine x 2)     Status: None (Preliminary result)   Collection Time: 09/23/15 11:11 PM  Result Value Ref Range   Specimen Description BLOOD RIGHT HAND    Special Requests IN PEDIATRIC BOTTLE 1CC    Culture  Setup Time      GRAM POSITIVE COCCI IN CHAINS PEDIATRIC BOTTLE CRITICAL RESULT CALLED TO, READ BACK BY AND VERIFIED WITH: T HOOD,RN AT 1250 09/24/15 BY L BENFIELD    Culture ENTEROCOCCUS SPECIES    Report Status PENDING   Glucose, capillary     Status: Abnormal   Collection Time: 09/23/15 11:56 PM  Result Value Ref Range   Glucose-Capillary 232 (H) 65 - 99 mg/dL  Culture, respiratory (NON-Expectorated)     Status: None (Preliminary result)   Collection Time: 09/24/15 12:37 AM  Result Value Ref Range   Specimen Description SPUTUM    Special Requests  SUCTIONED    Gram Stain      MODERATE WBC PRESENT, PREDOMINANTLY PMN FEW SQUAMOUS EPITHELIAL CELLS PRESENT FEW GRAM POSITIVE COCCI IN PAIRS FEW YEAST FEW GRAM NEGATIVE RODS Performed at [facility]    Culture PENDING    Report Status PENDING   Lactic acid, plasma     Status: None   Collection Time: 09/24/15  1:07 AM  Result Value Ref Range   Lactic Acid, Venous 1.4 0.5 - 2.0 mmol/L  CBC     Status: Abnormal   Collection Time: 09/24/15  5:30 AM  Result Value Ref Range   WBC 17.9 (H) 4.0 - 10.5 K/uL   RBC 2.69 (L) 3.87 - 5.11 MIL/uL   Hemoglobin 7.1 (L) 12.0 - 15.0 g/dL   HCT 23.1 (L) 36.0 - 46.0 %   MCV 85.9 78.0 - 100.0 fL   MCH 26.4 26.0 - 34.0 pg   MCHC 30.7 30.0 - 36.0 g/dL   RDW 15.5 11.5 - 15.5 %   Platelets 186 150 - 400 K/uL  Basic metabolic panel     Status: Abnormal   Collection Time: 09/24/15  5:30 AM  Result Value Ref Range   Sodium 147 (H) 135 - 145 mmol/L   Potassium 3.8 3.5 - 5.1 mmol/L   Chloride 121 (H) 101 - 111 mmol/L   CO2 20 (L) 22 - 32 mmol/L   Glucose, Bld 177 (H) 65 - 99 mg/dL   BUN 59 (H) 6 - 20 mg/dL   Creatinine, Ser 1.19 (H) 0.44 - 1.00 mg/dL   Calcium 8.0 (L) 8.9 - 10.3 mg/dL   GFR  calc non Af Amer 49 (L) >60 mL/min   GFR calc Af Amer 57 (L) >60 mL/min    Comment: (NOTE) The eGFR has been calculated using the CKD EPI equation. This calculation has not been validated in all clinical situations. eGFR's persistently <60 mL/min signify possible Chronic Kidney Disease.    Anion gap 6 5 - 15  Glucose, capillary     Status: Abnormal   Collection Time: 09/24/15  7:33 AM  Result Value Ref Range   Glucose-Capillary 130 (H) 65 - 99 mg/dL  Influenza panel by PCR (type A & B, H1N1)     Status: None   Collection Time: 09/24/15 10:51 AM  Result Value Ref Range   Influenza A By PCR NEGATIVE NEGATIVE   Influenza B By PCR NEGATIVE NEGATIVE   H1N1 flu by pcr NOT DETECTED NOT DETECTED    Comment:        The Xpert Flu assay (FDA approved  for nasal aspirates or washes and nasopharyngeal swab specimens), is intended as an aid in the diagnosis of influenza and should not be used as a sole basis for treatment.   Prepare RBC     Status: None   Collection Time: 09/24/15 12:00 PM  Result Value Ref Range   Order Confirmation ORDER PROCESSED BY BLOOD BANK   Glucose, capillary     Status: None   Collection Time: 09/24/15 12:17 PM  Result Value Ref Range   Glucose-Capillary 88 65 - 99 mg/dL  Glucose, capillary     Status: None   Collection Time: 09/24/15  3:46 PM  Result Value Ref Range   Glucose-Capillary 77 65 - 99 mg/dL  Vitamin B12     Status: None   Collection Time: 09/24/15  4:27 PM  Result Value Ref Range   Vitamin B-12 625 180 - 914 pg/mL    Comment: (NOTE) This assay is not validated for testing neonatal or myeloproliferative syndrome specimens for Vitamin B12 levels.   Folate     Status: None   Collection Time: 09/24/15  4:27 PM  Result Value Ref Range   Folate 18.0 >5.9 ng/mL  Iron and TIBC     Status: Abnormal   Collection Time: 09/24/15  4:27 PM  Result Value Ref Range   Iron 8 (L) 28 - 170 ug/dL   TIBC 134 (L) 250 - 450 ug/dL   Saturation Ratios 6 (L) 10.4 - 31.8 %   UIBC 126 ug/dL  Ferritin     Status: Abnormal   Collection Time: 09/24/15  4:27 PM  Result Value Ref Range   Ferritin 1241 (H) 11 - 307 ng/mL  Reticulocytes     Status: Abnormal   Collection Time: 09/24/15  4:27 PM  Result Value Ref Range   Retic Ct Pct 2.1 0.4 - 3.1 %   RBC. 2.57 (L) 3.87 - 5.11 MIL/uL   Retic Count, Manual 54.0 19.0 - 186.0 K/uL  Type and screen     Status: None (Preliminary result)   Collection Time: 09/24/15  4:37 PM  Result Value Ref Range   ABO/RH(D) B POS    Antibody Screen NEG    Sample Expiration 09/27/2015    Unit Number [id number]    Blood Component Type RED CELLS,LR    Unit division 00    Status of Unit ISSUED,FINAL    Transfusion Status OK TO TRANSFUSE    Crossmatch Result Compatible    Unit  Number [id number]    Blood Component Type RED CELLS,LR  Unit division 00    Status of Unit ISSUED    Transfusion Status OK TO TRANSFUSE    Crossmatch Result Compatible   Occult blood card to lab, stool RN will collect     Status: Abnormal   Collection Time: 09/24/15  7:39 PM  Result Value Ref Range   Fecal Occult Bld POSITIVE (A) NEGATIVE  Glucose, capillary     Status: None   Collection Time: 09/24/15  7:54 PM  Result Value Ref Range   Glucose-Capillary 97 65 - 99 mg/dL  Glucose, capillary     Status: Abnormal   Collection Time: 09/25/15 12:19 AM  Result Value Ref Range   Glucose-Capillary 149 (H) 65 - 99 mg/dL  Glucose, capillary     Status: Abnormal   Collection Time: 09/25/15  4:04 AM  Result Value Ref Range   Glucose-Capillary 168 (H) 65 - 99 mg/dL  CBC     Status: Abnormal   Collection Time: 09/25/15  4:30 AM  Result Value Ref Range   WBC 15.9 (H) 4.0 - 10.5 K/uL   RBC 2.84 (L) 3.87 - 5.11 MIL/uL   Hemoglobin 7.6 (L) 12.0 - 15.0 g/dL   HCT 24.4 (L) 36.0 - 46.0 %   MCV 85.9 78.0 - 100.0 fL   MCH 26.8 26.0 - 34.0 pg   MCHC 31.1 30.0 - 36.0 g/dL   RDW 15.5 11.5 - 15.5 %   Platelets 152 150 - 400 K/uL  Comprehensive metabolic panel     Status: Abnormal   Collection Time: 09/25/15  4:30 AM  Result Value Ref Range   Sodium 154 (H) 135 - 145 mmol/L   Potassium 3.8 3.5 - 5.1 mmol/L   Chloride 126 (H) 101 - 111 mmol/L   CO2 20 (L) 22 - 32 mmol/L   Glucose, Bld 216 (H) 65 - 99 mg/dL   BUN 42 (H) 6 - 20 mg/dL   Creatinine, Ser 1.12 (H) 0.44 - 1.00 mg/dL   Calcium 8.5 (L) 8.9 - 10.3 mg/dL   Total Protein 5.3 (L) 6.5 - 8.1 g/dL   Albumin 1.5 (L) 3.5 - 5.0 g/dL   AST 28 15 - 41 U/L   ALT 36 14 - 54 U/L   Alkaline Phosphatase 144 (H) 38 - 126 U/L   Total Bilirubin 0.1 (L) 0.3 - 1.2 mg/dL   GFR calc non Af Amer 53 (L) >60 mL/min   GFR calc Af Amer >60 >60 mL/min    Comment: (NOTE) The eGFR has been calculated using the CKD EPI equation. This calculation has not been  validated in all clinical situations. eGFR's persistently <60 mL/min signify possible Chronic Kidney Disease.    Anion gap 8 5 - 15  Magnesium     Status: None   Collection Time: 09/25/15  4:30 AM  Result Value Ref Range   Magnesium 1.8 1.7 - 2.4 mg/dL  Phosphorus     Status: None   Collection Time: 09/25/15  4:30 AM  Result Value Ref Range   Phosphorus 2.5 2.5 - 4.6 mg/dL  Prealbumin     Status: Abnormal   Collection Time: 09/25/15  4:30 AM  Result Value Ref Range   Prealbumin 5.2 (L) 18 - 38 mg/dL  Triglycerides     Status: Abnormal   Collection Time: 09/25/15  4:30 AM  Result Value Ref Range   Triglycerides 271 (H) <150 mg/dL  Glucose, capillary     Status: Abnormal   Collection Time: 09/25/15  7:17 AM  Result  Value Ref Range   Glucose-Capillary 210 (H) 65 - 99 mg/dL    MICRO: 2/01 blood cx enterococcus and yeast in 2 sets IMAGING: no infiltrate per my read, possible atelectasis on left base Dg Chest Port 1 View  09/23/2015  CLINICAL DATA:  60 year old female with shortness of breath and cough EXAM: PORTABLE CHEST 1 VIEW COMPARISON:  Radiograph dated 09/14/2015 FINDINGS: Right-sided PICC in stable positioning. Tracheostomy with tip above the carina in stable positioning. Single-view of the chest demonstrate a focal area of hazy density at the left lung base, likely atelectatic changes. Pneumonia is less likely but not excluded. Clinical correlation is recommended. The lungs are hypovolemic. No pleural effusion or pneumothorax. Stable cardiac silhouette. No acute osseous pathology. IMPRESSION: Minimal left lung base atelectatic changes versus less likely pneumonia. Clinical correlation recommended. Electronically Signed   By: Anner Crete M.D.   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 - place PIV to get line holiday - repeat blood cx tomorrow to see if bacteremia has cleared - please get TTE to evaluate for endocarditis ? Questionable HCAP = will continue on hcap, and follow trach aspirate. Looks polymicrobial on gram stain, though it may just need pulmonary toilet  Will provide further recs

## 2015-09-26 LAB — GLUCOSE, CAPILLARY
GLUCOSE-CAPILLARY: 127 mg/dL — AB (ref 65–99)
GLUCOSE-CAPILLARY: 91 mg/dL (ref 65–99)
Glucose-Capillary: 108 mg/dL — ABNORMAL HIGH (ref 65–99)
Glucose-Capillary: 105 mg/dL — ABNORMAL HIGH (ref 65–99)
Glucose-Capillary: 87 mg/dL (ref 65–99)
Glucose-Capillary: 91 mg/dL (ref 65–99)

## 2015-09-26 LAB — COMPREHENSIVE METABOLIC PANEL
ALK PHOS: 246 U/L — AB (ref 38–126)
ALT: 46 U/L (ref 14–54)
ANION GAP: 16 — AB (ref 5–15)
AST: 49 U/L — ABNORMAL HIGH (ref 15–41)
Albumin: 2 g/dL — ABNORMAL LOW (ref 3.5–5.0)
BUN: 39 mg/dL — ABNORMAL HIGH (ref 6–20)
CALCIUM: 9.4 mg/dL (ref 8.9–10.3)
CO2: 19 mmol/L — AB (ref 22–32)
Chloride: 125 mmol/L — ABNORMAL HIGH (ref 101–111)
Creatinine, Ser: 1.63 mg/dL — ABNORMAL HIGH (ref 0.44–1.00)
GFR, EST AFRICAN AMERICAN: 39 mL/min — AB (ref >60)
GFR, EST NON AFRICAN AMERICAN: 33 mL/min — AB (ref >60)
Glucose, Bld: 109 mg/dL — ABNORMAL HIGH (ref 65–99)
Potassium: 3.9 mmol/L (ref 3.5–5.1)
SODIUM: 160 mmol/L — AB (ref 135–145)
TOTAL PROTEIN: 6.7 g/dL (ref 6.5–8.1)
Total Bilirubin: 0.8 mg/dL (ref 0.3–1.2)

## 2015-09-26 LAB — CBC
HCT: 33.5 % — ABNORMAL LOW (ref 36.0–46.0)
HEMOGLOBIN: 11 g/dL — AB (ref 12.0–15.0)
MCH: 27.2 pg (ref 26.0–34.0)
MCHC: 32.8 g/dL (ref 30.0–36.0)
MCV: 82.7 fL (ref 78.0–100.0)
Platelets: 135 10*3/uL — ABNORMAL LOW (ref 150–400)
RBC: 4.05 MIL/uL (ref 3.87–5.11)
RDW: 15.6 % — ABNORMAL HIGH (ref 11.5–15.5)
WBC: 24.8 10*3/uL — AB (ref 4.0–10.5)

## 2015-09-26 LAB — TYPE AND SCREEN
ABO/RH(D): B POS
ANTIBODY SCREEN: NEGATIVE
UNIT DIVISION: 0
UNIT DIVISION: 0

## 2015-09-26 LAB — MAGNESIUM: MAGNESIUM: 2.2 mg/dL (ref 1.7–2.4)

## 2015-09-26 LAB — CULTURE, RESPIRATORY W GRAM STAIN: Culture: NORMAL

## 2015-09-26 LAB — PHOSPHORUS: PHOSPHORUS: 3.2 mg/dL (ref 2.5–4.6)

## 2015-09-26 LAB — CULTURE, RESPIRATORY

## 2015-09-26 MED ORDER — DEXTROSE 5 % IV SOLN
INTRAVENOUS | Status: DC
  Administered 2015-09-26 – 2015-10-01 (×6): via INTRAVENOUS

## 2015-09-26 NOTE — Progress Notes (Signed)
PARENTERAL NUTRITION CONSULT NOTE  and ABX consult  Pharmacy Consult for TPN and vanc/cefepime Indication: Dysphagia/laryngeal and recent gastric and duod ulcers unable to have PEG acutely  No Known Allergies  Patient Measurements: Height: '5\' 3"'$  (160 cm) (Simultaneous filing. User may not have seen previous data.) Weight: 200 lb 9.9 oz (91 kg) (Simultaneous filing. User may not have seen previous data.) IBW/kg (Calculated) : 52.4 Vital Signs: Temp: 98.9 F (37.2 C) (02/13 0500) Temp Source: Oral (02/13 0500) BP: 107/57 mmHg (02/13 0500) Pulse Rate: 108 (02/13 0500) Intake/Output from previous day: 02/12 0701 - 02/13 0700 In: 1735.8 [I.V.:660.8; Blood:485; IV Piggyback:500; TPN:90] Out: -  Intake/Output from this shift:    Labs:  Recent Labs  09/24/15 0530 09/25/15 0430 09/25/15 1550  WBC 17.9* 15.9* 17.8*  HGB 7.1* 7.6* 9.5*  HCT 23.1* 24.4* 29.8*  PLT 186 152 146*     Recent Labs  09/23/15 1812 09/24/15 0530 09/25/15 0430 09/25/15 1520  NA 146* 147* 154* 155*  K 4.3 3.8 3.8 3.7  CL 107 121* 126* 123*  CO2 21* 20* 20* 20*  GLUCOSE 393* 177* 216* 214*  BUN 86* 59* 42* 38*  CREATININE 1.83* 1.19* 1.12* 1.19*  CALCIUM 8.5* 8.0* 8.5* 9.0  MG  --   --  1.8  --   PHOS  --   --  2.5  --   PROT 6.6  --  5.3*  --   ALBUMIN 1.8*  --  1.5*  --   AST 31  --  28  --   ALT 39  --  36  --   ALKPHOS 157*  --  144*  --   BILITOT 0.6  --  0.1*  --   PREALBUMIN  --   --  5.2*  --   TRIG  --   --  271*  --    Estimated Creatinine Clearance: 54.5 mL/min (by C-G formula based on Cr of 1.19).    Recent Labs  09/25/15 2038 09/26/15 0048 09/26/15 0505  GLUCAP 117* 127* 108*   Medications: PTA TPN  Insulin Requirements in the past 24 hours:  9 Units SSI  Assessment: 60 yo on TPN PTA. Pharmacy consulted to dose TPN for Dysphagia/laryngeal and recent gastric and duod ulcers unable to have PEG acutely.    GI: on TPN PTA for dysphagia, unable to place PEG due to  gastric and duodenal ulcers. Currently off TPN for line holiday due to bacteremia. IV PPI  Endocrine:   PMH of DM2. CBGs incontrolled yesterday from 100-240s. SSI, no insulin listed in home TPN formula; on 2 Units novolg TID w meals PTA. thyroid dz  Lytes:  wnl exc Na 155 on 2/12. Mg and Phos ok on 2/12. No new labs today.  Renal: SCr at 1.19, CrCl ~72m/min. 1/2NS at 544mhr.  Hepatic: Alb 1.5, prealbumin 5.2, trig elevated at 271/ LFTs wnl exc Alk Phos 144.  ID: Day #4 of abx for enterococcus bacteremia, HCAP vs tracheobronchitis. Tmax was 102.8 yesterday, WBC up to 17.8.  Zosyn x 1 2/10 Vanc 2/10 >> 2/12 Cefepime 2/11 >> 2/12 Unasyn 2/12 >> Anidulafungin 2/12 >>  2/10 BCx2 > enterococcus (sensitivities pending) 2/10 Ucx > insignificant growth 2/11 flu neg 2/10 S pneum Uag > collected 2/10 HIV 2/11 sputum> yeast, GNR, CPC 2/12 BCx2 > sent  Nutritional Goals: per RD 09/24/15 KCal: 1700-1900 Protein: 90-105 g Fluid: 1.8-2.0 L  Current Nutrition:  NPO PTA TPN at 83 ml/hr IVFE at 10 ml/hr  This provides 100g of protein and ~1894 kcals which meets 100% of patient's needs.  Plan:  Continue to hold TPN and IVFE due to bacteremia F/U blood cx's (need to be ngtd for at least 48 hrs before considering placing new PICC) TEE results  Elenor Quinones, PharmD, [facility] Clinical Pharmacist Pager [phone] 09/26/2015 7:07 AM

## 2015-09-26 NOTE — Progress Notes (Signed)
[facility] for Infectious Disease    Date of Admission:  09/23/2015   Total days of antibiotics 2        Day 2 amp/sub        Day 2 anidulafungin           ID: Ashley Ramsey is a 60 y.o. female with enterococcal bacteremia and fungemia likely due to picc line receiving TPN Principal Problem:   Severe sepsis ([place]) Active Problems:   Type 2 diabetes mellitus without complication, without long-term current use of insulin (HCC)   AKI (acute kidney injury) ([place])   HCAP (healthcare-associated pneumonia)   DM2 (diabetes mellitus, type 2) (HCC)    Subjective: Fever up to 102.29F  Interval hx: patient labs showing Na of 160, Cr increased to 1.6, WBC up to 24.8. Had respiratory distress yesterday. picc line pulled  Medications:  . alteplase  2 mg Intracatheter Once  . alteplase  2 mg Intracatheter Once  . ampicillin-sulbactam (UNASYN) IV  3 g Intravenous 4 times per day  . anidulafungin  100 mg Intravenous Q24H  . insulin aspart  0-9 Units Subcutaneous 6 times per day  . pantoprazole (PROTONIX) IV  40 mg Intravenous Q12H    Objective: Vital signs in last 24 hours: Temp:  [98.9 F (37.2 C)-102.8 F (39.3 C)] 98.9 F (37.2 C) (02/13 0500) Pulse Rate:  [106-135] 106 (02/13 1104) Resp:  [19-28] 22 (02/13 1104) BP: (107-179)/(45-148) 107/57 mmHg (02/13 0500) SpO2:  [98 %-100 %] 100 % (02/13 1104) FiO2 (%):  [28 %] 28 % (02/13 1104)  Physical Exam  Constitutional:  oriented to person, place, and time. appears well-developed and well-nourished. No distress.  HENT: [place]/AT, PERRLA, no scleral icterus Neck: trach in place Mouth/Throat: Oropharynx is clear and moist. No oropharyngeal exudate.  Cardiovascular: Normal rate, regular rhythm and normal heart sounds. Exam reveals no gallop and no friction rub.  No murmur heard.  Pulmonary/Chest: Effort normal and breath sounds normal. Rhonchi+  Neck = supple, no nuchal rigidity Abdominal: Soft. Bowel sounds are normal.  exhibits no  distension. There is no tenderness.  Lymphadenopathy: no cervical adenopathy. No axillary adenopathy Neurological: alert and oriented to person, place, and time.  Skin: Skin is warm and dry. No rash noted. No erythema.  Psychiatric: a normal mood and affect.  behavior is normal.    Lab Results  Recent Labs  09/25/15 1520 09/25/15 1550 09/26/15 0919  WBC  --  17.8* 24.8*  HGB  --  9.5* 11.0*  HCT  --  29.8* 33.5*  NA 155*  --  160*  K 3.7  --  3.9  CL 123*  --  125*  CO2 20*  --  19*  BUN 38*  --  39*  CREATININE 1.19*  --  1.63*   Liver Panel  Recent Labs  09/25/15 0430 09/26/15 0919  PROT 5.3* 6.7  ALBUMIN 1.5* 2.0*  AST 28 49*  ALT 36 46  ALKPHOS 144* 246*  BILITOT 0.1* 0.8   Sedimentation Rate No results for input(s): ESRSEDRATE in the last 72 hours. C-Reactive Protein No results for input(s): CRP in the last 72 hours.  Microbiology:  Studies/Results: Dg Chest Port 1 View  09/25/2015  CLINICAL DATA:  60 year old with recent diagnosis of laryngeal cancer with indwelling tracheostomy, presenting 2 days ago with sepsis. Worsening hypoxia acutely today. EXAM: PORTABLE CHEST 1 VIEW COMPARISON:  09/23/2015 dating back to 08/29/2015. FINDINGS: Tracheostomy tube in satisfactory position below the thoracic inlet.  Markedly suboptimal inspiration with atelectasis in the lung bases. Cardiac silhouette mildly enlarged even allowing for technique and degree of inspiration. Pulmonary venous hypertension and minimal to mild interstitial pulmonary edema, new since 2 days ago. Streaky and patchy opacity at the left lung base. No confluent consolidation elsewhere. Note is made of slight inferior subluxation of the left humeral head relative to the glenoid, not seen on prior examinations, though moderate to severe degenerative changes are present in the left glenohumeral joint. IMPRESSION: 1. Markedly suboptimal inspiration with bibasilar atelectasis. Possible developing bronchopneumonia  at the left lung base. 2. Stable cardiomegaly. Pulmonary venous hypertension with minimal to mild interstitial pulmonary edema, query fluid overload. 3. Slight inferior subluxation of the left glenohumeral joint, associated with moderate to severe degenerative changes. Electronically Signed   By: Evangeline Dakin M.D.   On: 09/25/2015 18:12     Assessment/Plan:  Leukocytosis/fevers = concern for ongoing infection. Presumable source has been removed since it was likely picc line. Continue with current antibiotics  Enterococcal bacteremia = will continue with amp/sub  Fungemia = continue with anidulafungin. Need TEE to evaluate for endocarditis  Hypernatremia= probably related to change with TPN. Recommend to correct slowly  aki = will continue to monitor  Laryngeal cancer = defer to oncology to discuss if there is treatment options vs. Having palliative care consultation  [facility]ax[facility] Fl[facility] Cell: [phone] Pager: [phone]  09/26/2015, 12:15 PM

## 2015-09-26 NOTE — Progress Notes (Signed)
[facility] PROGRESS NOTE  Ashley Ramsey [id number] DOB: 29-Jul-1956 DOA: 09/23/2015 PCP: Arnoldo Morale, MD  Assessment/Plan: 1. Enterococcal and Yeast Bacteremia -likely PICC related -PICC removed yesterday -Clinically improving -Continue Unasyn and antifungal per ID recommendations -Repeat blood cultures, line holiday - check 2-D echocardiogram   2. Anemia  -acute on chronic, multifactorial  -2 weeks ago found to have proximal stomach and duodenal ulcer, status post EGD on 09/04/2015, also had Gastroduodenal Artery was coiled/embolized by IR on 09-07-15, required 5units PRBC then -keep on PPI, Transfused 2 units this admission, if blood loss doesn't slow down will need Gi to re-eval, Hb improved, 11 today -anemia panel with Iron deficiency -will give Iv Iron this admission  3. 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 -ENT Dr Constance Holster recommended outpatient follow-up with [facility] ENT once acute issues have resolved per ENT may be in the next 1-2 weeks. -No PEG tube as she has stomach and duodenal ulcers now -continue PICC line for TNA.  -SLP eval for PMV and continued swallow eval.  -will need Gi eval in about a month for PEG, Fu with Dr.Mann -Called and d/w Dr.Gorsuch this am, she recommended Hospice, unfortunately pt and family want full Aggressive scope of Rx  4. Left renal mass: Highly suspicious for renal cell carcinoma.  -case d/w Dr. Ree Kida last admission, recommended outpatient  follow-up with him in the office for consideration of nephrectomy in the future  5. DM -SSI for now  6. Morbid obesity:  -nutrition via TPN now  7. DVT proph:  -SCDs, due to anemia, recent massive GI bleed  Code Status: FUll Code Family Communication: none at bedside, left message for daughter Disposition Plan: Keep in SDU   HPI/Subjective: Feels better, lips words  Objective: Filed Vitals:   09/26/15 0750 09/26/15 1104  BP:    Pulse: 116 106  Temp:    Resp: 19 22    Intake/Output Summary (Last 24 hours) at 09/26/15 1234 Last data filed at 09/26/15 0900  Gross per 24 hour  Intake 1735.83 ml  Output      0 ml  Net 1735.83 ml   Filed Weights   09/23/15 1606 09/23/15 2253  Weight: 92.987 kg (205 lb) 91 kg (200 lb 9.9 oz)    Exam:   General:  AAOx3, chronically ill appearing, no distress  HEENT: around trach some phlegm noted  Cardiovascular: S1S2/RRR  Respiratory: conducted upper airway sounds  Abdomen: soft, NT, BS present  Musculoskeletal: no edema c/c   Data Reviewed: Basic Metabolic Panel:  Recent Labs Lab 09/23/15 1812 09/24/15 0530 09/25/15 0430 09/25/15 1520 09/26/15 0919  NA 146* 147* 154* 155* 160*  K 4.3 3.8 3.8 3.7 3.9  CL 107 121* 126* 123* 125*  CO2 21* 20* 20* 20* 19*  GLUCOSE 393* 177* 216* 214* 109*  BUN 86* 59* 42* 38* 39*  CREATININE 1.83* 1.19* 1.12* 1.19* 1.63*  CALCIUM 8.5* 8.0* 8.5* 9.0 9.4  MG  --   --  1.8  --  2.2  PHOS  --   --  2.5  --  3.2   Liver Function Tests:  Recent Labs Lab 09/23/15 1812 09/25/15 0430 09/26/15 0919  AST 31 28 49*  ALT 39 36 46  ALKPHOS 157* 144* 246*  BILITOT 0.6 0.1* 0.8  PROT 6.6 5.3* 6.7  ALBUMIN 1.8* 1.5* 2.0*   No results for input(s): LIPASE, AMYLASE in the last 168 hours. No results for input(s): AMMONIA in the last 168 hours. CBC:  Recent Labs Lab 09/23/15 1422 09/24/15 0530 09/25/15 0430 09/25/15 1550 09/26/15 0919  WBC 28.0* 17.9* 15.9* 17.8* 24.8*  NEUTROABS  24.1*  --   --   --   --   HGB 9.4* 7.1* 7.6* 9.5* 11.0*  HCT 31.1* 23.1* 24.4* 29.8* 33.5*  MCV 88.4 85.9 85.9 84.9 82.7  PLT 248 186 152 146* 135*   Cardiac Enzymes: No results for input(s): CKTOTAL, CKMB, CKMBINDEX, TROPONINI in the last 168 hours. BNP (last 3 results) No results for input(s): BNP in the last 8760 hours.  ProBNP (last 3 results) No results for input(s): PROBNP in the last 8760 hours.  CBG:  Recent Labs Lab 09/25/15 1723 09/25/15 2038 09/26/15 0048 09/26/15 0505 09/26/15 0822  GLUCAP 164* 117* 127* 108* 105*    Recent Results (from the past 240 hour(s))  Urine culture     Status: None   Collection Time: 09/23/15  6:40 PM  Result Value Ref Range Status   Specimen Description URINE, CATHETERIZED  Final   Special Requests NONE  Final   Culture 7,000 COLONIES/mL INSIGNIFICANT GROWTH  Final   Report Status 09/25/2015 FINAL  Final  Blood Culture (routine x 2)     Status: None (Preliminary result)   Collection Time: 09/23/15 11:10 PM  Result Value Ref Range Status   Specimen Description BLOOD LEFT HAND  Final   Special Requests IN PEDIATRIC BOTTLE 3CC  Final   Culture  Setup Time   Final    GRAM POSITIVE COCCI IN CHAINS PEDIATRIC BOTTLE CRITICAL RESULT CALLED TO, READ BACK BY AND VERIFIED WITH: T HOOD,RN AT 1436 09/24/15 BY L BENFIELD    Culture   Final    ENTEROCOCCUS SPECIES YEAST CRITICAL RESULT CALLED TO, READ BACK BY AND VERIFIED WITH: H MILLS,RN AT 1101 09/25/15 BY L BENFIELD    Report Status PENDING  Incomplete   Organism ID, Bacteria ENTEROCOCCUS SPECIES  Final      Susceptibility   Enterococcus species - MIC*    AMPICILLIN <=2 SENSITIVE Sensitive     VANCOMYCIN 1 SENSITIVE Sensitive     GENTAMICIN SYNERGY SENSITIVE Sensitive     LINEZOLID 2 SENSITIVE Sensitive     * ENTEROCOCCUS SPECIES  Blood Culture (routine x 2)     Status: None (Preliminary result)   Collection Time: 09/23/15 11:11 PM  Result Value Ref Range Status   Specimen  Description BLOOD RIGHT HAND  Final   Special Requests IN PEDIATRIC BOTTLE 1CC  Final   Culture  Setup Time   Final    GRAM POSITIVE COCCI IN CHAINS PEDIATRIC BOTTLE CRITICAL RESULT CALLED TO, READ BACK BY AND VERIFIED WITH: T HOOD,RN AT 1250 09/24/15 BY L BENFIELD    Culture   Final    ENTEROCOCCUS SPECIES SUSCEPTIBILITIES PERFORMED ON PREVIOUS CULTURE WITHIN THE LAST 5 DAYS. YEAST CRITICAL RESULT CALLED TO, READ BACK BY AND VERIFIED WITH: H MILLS,RN AT 1101 09/25/15 BY L BENFIELD    Report Status PENDING  Incomplete  Culture, respiratory (NON-Expectorated)     Status: None   Collection  Time: 09/24/15 12:37 AM  Result Value Ref Range Status   Specimen Description SPUTUM  Final   Special Requests SUCTIONED  Final   Gram Stain   Final    MODERATE WBC PRESENT, PREDOMINANTLY PMN FEW SQUAMOUS EPITHELIAL CELLS PRESENT FEW GRAM POSITIVE COCCI IN PAIRS FEW YEAST FEW GRAM NEGATIVE RODS Performed at [facility]    Culture   Final    NORMAL OROPHARYNGEAL FLORA Performed at [facility]    Report Status 09/26/2015 FINAL  Final     Studies: Dg Chest Port 1 View  09/25/2015  CLINICAL DATA:  60 year old with recent diagnosis of laryngeal cancer with indwelling tracheostomy, presenting 2 days ago with sepsis. Worsening hypoxia acutely today. EXAM: PORTABLE CHEST 1 VIEW COMPARISON:  09/23/2015 dating back to 08/29/2015. FINDINGS: Tracheostomy tube in satisfactory position below the thoracic inlet. Markedly suboptimal inspiration with atelectasis in the lung bases. Cardiac silhouette mildly enlarged even allowing for technique and degree of inspiration. Pulmonary venous hypertension and minimal to mild interstitial pulmonary edema, new since 2 days ago. Streaky and patchy opacity at the left lung base. No confluent consolidation elsewhere. Note is made of slight inferior subluxation of the left humeral head relative to the glenoid, not seen on prior examinations, though moderate to  severe degenerative changes are present in the left glenohumeral joint. IMPRESSION: 1. Markedly suboptimal inspiration with bibasilar atelectasis. Possible developing bronchopneumonia at the left lung base. 2. Stable cardiomegaly. Pulmonary venous hypertension with minimal to mild interstitial pulmonary edema, query fluid overload. 3. Slight inferior subluxation of the left glenohumeral joint, associated with moderate to severe degenerative changes. Electronically Signed   By: Evangeline Dakin M.D.   On: 09/25/2015 18:12    Scheduled Meds: . alteplase  2 mg Intracatheter Once  . alteplase  2 mg Intracatheter Once  . ampicillin-sulbactam (UNASYN) IV  3 g Intravenous 4 times per day  . anidulafungin  100 mg Intravenous Q24H  . insulin aspart  0-9 Units Subcutaneous 6 times per day  . pantoprazole (PROTONIX) IV  40 mg Intravenous Q12H   Continuous Infusions: . sodium chloride 50 mL/hr at 09/25/15 1647   Antibiotics Given (last 72 hours)    Date/Time Action Medication Dose Rate   09/24/15 0047 Given   ceFEPIme (MAXIPIME) 2 g in dextrose 5 % 50 mL IVPB 2 g 100 mL/hr   09/24/15 1258 Given   ceFEPIme (MAXIPIME) 2 g in dextrose 5 % 50 mL IVPB 2 g 100 mL/hr   09/24/15 1632 Given   vancomycin (VANCOCIN) 1,250 mg in sodium chloride 0.9 % 250 mL IVPB 1,250 mg 166.7 mL/hr   09/24/15 2258 Given   ceFEPIme (MAXIPIME) 1 g in dextrose 5 % 50 mL IVPB 1 g 100 mL/hr   09/25/15 0452 Given   vancomycin (VANCOCIN) 1,250 mg in sodium chloride 0.9 % 250 mL IVPB 1,250 mg 166.7 mL/hr   09/25/15 [id number] Given   ceFEPIme (MAXIPIME) 1 g in dextrose 5 % 50 mL IVPB 1 g 100 mL/hr   09/25/15 1355 Given   Ampicillin-Sulbactam (UNASYN) 3 g in sodium chloride 0.9 % 100 mL IVPB 3 g 100 mL/hr   09/25/15 1842 Given   Ampicillin-Sulbactam (UNASYN) 3 g in sodium chloride 0.9 % 100 mL IVPB 3 g 100 mL/hr   09/25/15 2327 Given   Ampicillin-Sulbactam (UNASYN) 3 g in sodium chloride 0.9 % 100 mL IVPB 3 g 100 mL/hr   09/26/15 0525  Given   Ampicillin-Sulbactam (UNASYN) 3 g in sodium chloride  0.9 % 100 mL IVPB 3 g 100 mL/hr   09/26/15 1150 Given   Ampicillin-Sulbactam (UNASYN) 3 g in sodium chloride 0.9 % 100 mL IVPB 3 g 100 mL/hr      Principal Problem:   Severe sepsis (HCC) Active Problems:   Type 2 diabetes mellitus without complication, without long-term current use of insulin (HCC)   AKI (acute kidney injury) ([place])   HCAP (healthcare-associated pneumonia)   DM2 (diabetes mellitus, type 2) ([place])    Time spent: 46min    Brice Potteiger  [facility] Pager [phone]. If 7PM-7AM, please contact night-coverage at [email or web address], password [facility] 09/26/2015, 12:34 PM  LOS: 3 days

## 2015-09-26 NOTE — Progress Notes (Signed)
Blood culture came back positive for yeast on both sets: growing yeast.

## 2015-09-27 ENCOUNTER — Inpatient Hospital Stay (HOSPITAL_COMMUNITY): Payer: Medicaid Other

## 2015-09-27 DIAGNOSIS — I339 Acute and subacute endocarditis, unspecified: Secondary | ICD-10-CM

## 2015-09-27 LAB — CBC
HEMATOCRIT: 26.5 % — AB (ref 36.0–46.0)
HEMOGLOBIN: 8.6 g/dL — AB (ref 12.0–15.0)
MCH: 27.7 pg (ref 26.0–34.0)
MCHC: 32.5 g/dL (ref 30.0–36.0)
MCV: 85.5 fL (ref 78.0–100.0)
PLATELETS: 125 10*3/uL — AB (ref 150–400)
RBC: 3.1 MIL/uL — AB (ref 3.87–5.11)
RDW: 16.1 % — ABNORMAL HIGH (ref 11.5–15.5)
WBC: 26 10*3/uL — AB (ref 4.0–10.5)

## 2015-09-27 LAB — GLUCOSE, CAPILLARY
GLUCOSE-CAPILLARY: 104 mg/dL — AB (ref 65–99)
GLUCOSE-CAPILLARY: 107 mg/dL — AB (ref 65–99)
Glucose-Capillary: 113 mg/dL — ABNORMAL HIGH (ref 65–99)
Glucose-Capillary: 119 mg/dL — ABNORMAL HIGH (ref 65–99)
Glucose-Capillary: 132 mg/dL — ABNORMAL HIGH (ref 65–99)
Glucose-Capillary: 108 mg/dL — ABNORMAL HIGH (ref 65–99)

## 2015-09-27 LAB — BASIC METABOLIC PANEL
ANION GAP: 13 (ref 5–15)
ANION GAP: 12 (ref 5–15)
BUN: 45 mg/dL — ABNORMAL HIGH (ref 6–20)
BUN: 43 mg/dL — ABNORMAL HIGH (ref 6–20)
CALCIUM: 8.9 mg/dL (ref 8.9–10.3)
CHLORIDE: 127 mmol/L — AB (ref 101–111)
CHLORIDE: 127 mmol/L — AB (ref 101–111)
CO2: 20 mmol/L — AB (ref 22–32)
CO2: 20 mmol/L — AB (ref 22–32)
CREATININE: 1.64 mg/dL — AB (ref 0.44–1.00)
CREATININE: 1.49 mg/dL — AB (ref 0.44–1.00)
Calcium: 8.8 mg/dL — ABNORMAL LOW (ref 8.9–10.3)
GFR calc non Af Amer: 33 mL/min — ABNORMAL LOW (ref >60)
GFR calc non Af Amer: 37 mL/min — ABNORMAL LOW (ref >60)
GFR, EST AFRICAN AMERICAN: 39 mL/min — AB (ref >60)
GFR, EST AFRICAN AMERICAN: 43 mL/min — AB (ref >60)
Glucose, Bld: 121 mg/dL — ABNORMAL HIGH (ref 65–99)
Glucose, Bld: 135 mg/dL — ABNORMAL HIGH (ref 65–99)
POTASSIUM: 3.4 mmol/L — AB (ref 3.5–5.1)
Potassium: 3.9 mmol/L (ref 3.5–5.1)
SODIUM: 160 mmol/L — AB (ref 135–145)
SODIUM: 159 mmol/L — AB (ref 135–145)

## 2015-09-27 LAB — CG4 I-STAT (LACTIC ACID): Lactic Acid, Venous: 1.65 mmol/L (ref 0.5–2.0)

## 2015-09-27 NOTE — Progress Notes (Signed)
[facility] for Infectious Disease    Date of Admission:  09/23/2015   Total days of antibiotics 2        Day 2 amp/sub        Day 2 anidulafungin           ID: Ashley Ramsey is a 60 y.o. female with enterococcal bacteremia and fungemia likely due to picc line receiving TPN Principal Problem:   Severe sepsis ([place]) Active Problems:   Type 2 diabetes mellitus without complication, without long-term current use of insulin (HCC)   AKI (acute kidney injury) ([place])   HCAP (healthcare-associated pneumonia)   DM2 (diabetes mellitus, type 2) (HCC)    Subjective: afebrile  Interval hx: wbc still elevated at 26K and cr elevated at 1.6 above baseline. Enterococcus is Amp S  Medications:  . alteplase  2 mg Intracatheter Once  . alteplase  2 mg Intracatheter Once  . ampicillin-sulbactam (UNASYN) IV  3 g Intravenous 4 times per day  . anidulafungin  100 mg Intravenous Q24H  . insulin aspart  0-9 Units Subcutaneous 6 times per day  . pantoprazole (PROTONIX) IV  40 mg Intravenous Q12H    Objective: Vital signs in last 24 hours: Temp:  [97.5 F (36.4 C)-98.7 F (37.1 C)] 98.1 F (36.7 C) (02/14 1147) Pulse Rate:  [103-122] 104 (02/14 1147) Resp:  [12-36] 25 (02/14 1147) BP: (97-136)/(57-91) 114/74 mmHg (02/14 1147) SpO2:  [97 %-100 %] 100 % (02/14 1147) FiO2 (%):  [28 %] 28 % (02/14 1141)  Physical Exam  Constitutional:  oriented to person, place, and time. appears well-developed and well-nourished. No distress. Sitting up on edge of bed HENT: [place]/AT, PERRLA, no scleral icterus Neck: trach in place Mouth/Throat: Oropharynx is clear and moist. No oropharyngeal exudate.  Cardiovascular: Normal rate, regular rhythm and normal heart sounds. Exam reveals no gallop and no friction rub.  No murmur heard.  Pulmonary/Chest: Effort normal and breath sounds normal. Rhonchi+  Neck = supple, no nuchal rigidity Abdominal: Soft. Bowel sounds are normal.  exhibits no distension. There is no  tenderness.  Lymphadenopathy: no cervical adenopathy. No axillary adenopathy Neurological: alert and oriented to person, place, and time.  Skin: Skin is warm and dry. No rash noted. No erythema.  Psychiatric: a normal mood and affect.  behavior is normal.    Lab Results  Recent Labs  09/26/15 0919 09/27/15 0229  WBC 24.8* 26.0*  HGB 11.0* 8.6*  HCT 33.5* 26.5*  NA 160* 160*  K 3.9 3.4*  CL 125* 127*  CO2 19* 20*  BUN 39* 45*  CREATININE 1.63* 1.64*   Liver Panel  Recent Labs  09/25/15 0430 09/26/15 0919  PROT 5.3* 6.7  ALBUMIN 1.5* 2.0*  AST 28 49*  ALT 36 46  ALKPHOS 144* 246*  BILITOT 0.1* 0.8    Microbiology: 2/13 blood cx pending 2/12 blood cx yeast x 2 2/10 blood cx enterococcus amp S Studies/Results: Dg Chest Port 1 View  09/25/2015  CLINICAL DATA:  60 year old with recent diagnosis of laryngeal cancer with indwelling tracheostomy, presenting 2 days ago with sepsis. Worsening hypoxia acutely today. EXAM: PORTABLE CHEST 1 VIEW COMPARISON:  09/23/2015 dating back to 08/29/2015. FINDINGS: Tracheostomy tube in satisfactory position below the thoracic inlet. Markedly suboptimal inspiration with atelectasis in the lung bases. Cardiac silhouette mildly enlarged even allowing for technique and degree of inspiration. Pulmonary venous hypertension and minimal to mild interstitial pulmonary edema, new since 2 days ago. Streaky and patchy opacity at  the left lung base. No confluent consolidation elsewhere. Note is made of slight inferior subluxation of the left humeral head relative to the glenoid, not seen on prior examinations, though moderate to severe degenerative changes are present in the left glenohumeral joint. IMPRESSION: 1. Markedly suboptimal inspiration with bibasilar atelectasis. Possible developing bronchopneumonia at the left lung base. 2. Stable cardiomegaly. Pulmonary venous hypertension with minimal to mild interstitial pulmonary edema, query fluid overload. 3.  Slight inferior subluxation of the left glenohumeral joint, associated with moderate to severe degenerative changes. Electronically Signed   By: Evangeline Dakin M.D.   On: 09/25/2015 18:12     Assessment/Plan:  Leukocytosis/fevers = improved fever, but leukocytosis elevated. Will continue to monitor but could be due to inflammation also associated with untreated malignancy  Enterococcal bacteremia = will continue with amp/sub  Fungemia = continue with anidulafungin. Need TEE to evaluate for endocarditis  Hypernatremia= probably related to change with TPN. Recommend to correct slowly  aki = will continue to monitor  Laryngeal cancer = defer to oncology to discuss if there is treatment options vs. Having palliative care consultation  Nutrition = if blood cx from 2/13 are ngtd at 72hr can place piccline.  Baxter Flattery [facility] for Infectious Diseases Cell: [phone] Pager: [phone]  09/27/2015, 2:34 PM

## 2015-09-27 NOTE — Progress Notes (Signed)
PARENTERAL NUTRITION CONSULT NOTE  and ABX consult  Pharmacy Consult for TPN Indication: Dysphagia/laryngeal and recent gastric and duod ulcers unable to have PEG acutely  No Known Allergies  Patient Measurements: Height: 5' 3" (160 cm) (Simultaneous filing. User may not have seen previous data.) Weight: 200 lb 9.9 oz (91 kg) (Simultaneous filing. User may not have seen previous data.) IBW/kg (Calculated) : 52.4 Vital Signs: Temp: 97.5 F (36.4 C) (02/14 0400) Temp Source: Oral (02/14 0400) BP: 136/90 mmHg (02/14 0600) Pulse Rate: 106 (02/14 0600) Intake/Output from previous day: 02/13 0701 - 02/14 0700 In: 1933.8 [I.V.:1403.8; IV Piggyback:530] Out: 375 [Urine:375] Intake/Output from this shift:    Labs:  Recent Labs  09/25/15 1550 09/26/15 0919 09/27/15 0229  WBC 17.8* 24.8* 26.0*  HGB 9.5* 11.0* 8.6*  HCT 29.8* 33.5* 26.5*  PLT 146* 135* 125*     Recent Labs  09/25/15 0430 09/25/15 1520 09/26/15 0919 09/27/15 0229  NA 154* 155* 160* 160*  K 3.8 3.7 3.9 3.4*  CL 126* 123* 125* 127*  CO2 20* 20* 19* 20*  GLUCOSE 216* 214* 109* 121*  BUN 42* 38* 39* 45*  CREATININE 1.12* 1.19* 1.63* 1.64*  CALCIUM 8.5* 9.0 9.4 8.8*  MG 1.8  --  2.2  --   PHOS 2.5  --  3.2  --   PROT 5.3*  --  6.7  --   ALBUMIN 1.5*  --  2.0*  --   AST 28  --  49*  --   ALT 36  --  46  --   ALKPHOS 144*  --  246*  --   BILITOT 0.1*  --  0.8  --   PREALBUMIN 5.2*  --   --   --   TRIG 271*  --   --   --    Estimated Creatinine Clearance: 39.5 mL/min (by C-G formula based on Cr of 1.64).    Recent Labs  09/26/15 2002 09/27/15 0031 09/27/15 0433  GLUCAP 91 113* 119*   Medications: PTA TPN  Insulin Requirements in the past 24 hours:  None  Assessment: 60 yo on TPN PTA. Pharmacy consulted to dose TPN for Dysphagia/laryngeal and recent gastric and duod ulcers unable to have PEG acutely.    GI: on TPN PTA for dysphagia, unable to place PEG due to gastric and duodenal ulcers.  Albumin low at 2.0, prealbumin 5.2. Currently off TPN for line holiday due to bacteremia. IV PPI  Endocrine:   PMH of DM2. CBGs better controlled today (80-110s). SSI, no insulin listed in home TPN formula; on 2 Units novolg TID w meals PTA. thyroid dz  Lytes:  wnl exc Na up to 160 (could be due to TPN on hold) and K 3.4. Mg and Phos ok on 2/13.  Renal: SCr elevated at 1.64, CrCl ~94m/min. D5 @ 745mhr  Hepatic: Trig elevated at 271. LFTs wnl exc Alk Phos up to 246.  ID: Day #5 of abx for enterococcus bacteremia, HCAP vs tracheobronchitis. Afebrile yesterday, WBC up to 26.0  Zosyn x 1 2/10  Vanc 2/10 >> 2/12 Cefepime 2/11 >> 2/12 Unasyn 2/12 >> Anidulafungin 2/12 >>  2/10 BCx2 > enterococcus (sensitivities pending) 2/10 Ucx > insignificant growth 2/11 flu neg 2/10 S pneum Uag > collected 2/10 HIV 2/11 sputum> yeast, GNR, CPC 2/12 BCx2 > sent  Nutritional Goals: per RD 09/24/15 KCal: 1700-1900 Protein: 90-105 g Fluid: 1.8-2.0 L  Current Nutrition:  NPO PTA TPN at 83 ml/hr IVFE at 10 ml/hr  This provides 100g of protein and ~1894 kcals which meets 100% of patient's needs.  Plan:  Continue to hold TPN and IVFE due to bacteremia F/U blood cx's (need to be ngtd for at least 48 hrs before considering placing new PICC) TEE results, ID recs  Elenor Quinones, PharmD, BCPS Clinical Pharmacist Pager [phone] 09/27/2015 7:25 AM

## 2015-09-27 NOTE — Progress Notes (Addendum)
[facility] PROGRESS NOTE  Ashley Ramsey [id number] DOB: 05-12-1956 DOA: 09/23/2015 PCP: Arnoldo Morale, MD   Ashley Ramsey is a 60 year old female with a history of Type 2 DM (A1c 6.2), HTN was diagnosed with laryngeal cancer during a hospitalization at [facility] from 08/29/15 to 09/12/15. On CT neck she was found to have a laryngeal mass, CT abdomen revealed a left renal mass.  ENT was consulted-patient underwent a biopsy on 1/20 confirming malignancy. Hospital course was complicated by development of Massive upper  GI bleeding and associated blood loss anemia, s/p 5 units PRBC. GI was consulted, endoscopy revealed duodenal ulcer; she also had Gastroduodenal artery embolizatyion by IR.  She underwent tracheostomy on 09/04/2015; PEG tube could not be placed due to gastric and duodenal ulcers.  Seen by Oncology and Palliative medicine and Radiation Oncology then, Supposed to FU with ENT at [facility] now admitted with Sepsis, found to have Enterococcal and Yeast bacteremia. PICC removed, ID consutling  Assessment/Plan: 1. Enterococcal and Yeast Bacteremia -likely PICC related -PICC removed 2/15 -Clinically improving -Continue Unasyn and antifungal per ID recommendations -Repeat blood cultures, line holiday -2-D echocardiogram completed, results pending  2. Anemia  -acute on chronic, multifactorial  -2 weeks ago found to have proximal stomach and duodenal ulcer, status post EGD on 09/04/2015, then required Gastroduodenal Artery coiling/embolization by IR on 09-07-15, required 5units PRBC then -keep on PPI, Transfused 2 units this admission, had some dark stools day before and this am RN reporting brown stool -anemia panel with Iron deficiency -will need IV Iron this admission  3. Invasive Sq Cell Laryngeal CA, suspicious lymph nodes involvement per ENT -seen by  ENT & oncology last month, underwent laryngoscopy/ biopsy on 1/20 -Followed by Dr.ROsen and he recommended FU with ENT  Dr.James Vicie Mutters at [facility] for Eval for Surgery -s/p tracheostomy placement by ENT on 09/05/2015 -Seen by Oncology Dr.Gorsuch and Rad Onc, and Palliative consult completed -Pt and family wants full aggressive scope of Rx -per Dr.Gorsuch, Patient's prognosis is extremely poor, poor candidate for chemotherapy, also most likely has renal cancer.  -No PEG tube as she has stomach and duodenal ulcers now -was on TNA via PICC line -SLP eval for PMV and continued swallow eval.  -seen by Gi Dr.Mann and Hung then, i called and d/w Dr.Hung regarding J tube, he felt that if a J tube is needed then it would need to be done surgically, i doubt she would be considered for Surgical J tube in setting of Enterococcal and Yeast bacteremia at this time. -Called and d/w Dr.Gorsuch 2/13, she recommended Hospice, unfortunately pt and family want full Aggressive scope of Rx  4. Left renal mass: Highly suspicious for renal cell carcinoma.  -case d/w Dr. Ree Kida last admission, recommended outpatient follow-up with him in the office for consideration of nephrectomy in the future  5. DM -SSI for now  6. Morbid obesity:  -nutrition via TPN now  7. Hypernatremia -free water deficit, since NPO, increase d5W repeat Bmet this afternoon and in am  DVT proph:  -SCDs, due to anemia, recent massive GI bleed  Code Status: FUll Code Family Communication: none at bedside, left message for daughter yesterday and today Disposition Plan: Keep in SDU   HPI/Subjective: Feels better, lips words, no dark stools  Objective: Filed Vitals:   09/27/15 1141 09/27/15 1147  BP: 99/59 114/74  Pulse: 103 104  Temp:  98.1 F (36.7 C)  Resp: 23 25    Intake/Output Summary (  Last 24 hours) at 09/27/15 1318 Last data filed at 09/27/15 1000  Gross per 24 hour  Intake 1866.75 ml  Output    375 ml  Net 1491.75 ml   Filed Weights   09/23/15 1606 09/23/15 2253  Weight: 92.987 kg (205 lb) 91 kg (200 lb 9.9 oz)     Exam:   General:  AAOx3, chronically ill appearing, no distress  HEENT: around trach some phlegm noted  Cardiovascular: S1S2/RRR  Respiratory: conducted upper airway sounds  Abdomen: soft, NT, BS present  Musculoskeletal: no edema c/c   Data Reviewed: Basic Metabolic Panel:  Recent Labs Lab 09/24/15 0530 09/25/15 0430 09/25/15 1520 09/26/15 0919 09/27/15 0229  NA 147* 154* 155* 160* 160*  K 3.8 3.8 3.7 3.9 3.4*  CL 121* 126* 123* 125* 127*  CO2 20* 20* 20* 19* 20*  GLUCOSE 177* 216* 214* 109* 121*  BUN 59* 42* 38* 39* 45*  CREATININE 1.19* 1.12* 1.19* 1.63* 1.64*  CALCIUM 8.0* 8.5* 9.0 9.4 8.8*  MG  --  1.8  --  2.2  --   PHOS  --  2.5  --  3.2  --    Liver Function Tests:  Recent Labs Lab 09/23/15 1812 09/25/15 0430 09/26/15 0919  AST 31 28 49*  ALT 39 36 46  ALKPHOS 157* 144* 246*  BILITOT 0.6 0.1* 0.8  PROT 6.6 5.3* 6.7  ALBUMIN 1.8* 1.5* 2.0*   No results for input(s): LIPASE, AMYLASE in the last 168 hours. No results for input(s): AMMONIA in the last 168 hours. CBC:  Recent Labs Lab 09/23/15 1422 09/24/15 0530 09/25/15 0430 09/25/15 1550 09/26/15 0919 09/27/15 0229  WBC 28.0* 17.9* 15.9* 17.8* 24.8* 26.0*  NEUTROABS 24.1*  --   --   --   --   --   HGB 9.4* 7.1* 7.6* 9.5* 11.0* 8.6*  HCT 31.1* 23.1* 24.4* 29.8* 33.5* 26.5*  MCV 88.4 85.9 85.9 84.9 82.7 85.5  PLT 248 186 152 146* 135* 125*   Cardiac Enzymes: No results for input(s): CKTOTAL, CKMB, CKMBINDEX, TROPONINI in the last 168 hours. BNP (last 3 results) No results for input(s): BNP in the last 8760 hours.  ProBNP (last 3 results) No results for input(s): PROBNP in the last 8760 hours.  CBG:  Recent Labs Lab 09/26/15 1628 09/26/15 2002 09/27/15 0031 09/27/15 0433 09/27/15 0805  GLUCAP 87 91 113* 119* 104*    Recent Results (from the past 240 hour(s))  Urine culture     Status: None   Collection Time: 09/23/15  6:40 PM  Result Value Ref Range Status   Specimen  Description URINE, CATHETERIZED  Final   Special Requests NONE  Final   Culture 7,000 COLONIES/mL INSIGNIFICANT GROWTH  Final   Report Status 09/25/2015 FINAL  Final  Blood Culture (routine x 2)     Status: None (Preliminary result)   Collection Time: 09/23/15 11:10 PM  Result Value Ref Range Status   Specimen Description BLOOD LEFT HAND  Final   Special Requests IN PEDIATRIC BOTTLE 3CC  Final   Culture  Setup Time   Final    GRAM POSITIVE COCCI IN CHAINS PEDIATRIC BOTTLE CRITICAL RESULT CALLED TO, READ BACK BY AND VERIFIED WITH: T HOOD,RN AT 1436 09/24/15 BY L BENFIELD    Culture   Final    ENTEROCOCCUS SPECIES YEAST IDENTIFICATION TO FOLLOW CRITICAL RESULT CALLED TO, READ BACK BY AND VERIFIED WITH: H MILLS,RN AT 1101 09/25/15 BY L BENFIELD    Report Status PENDING  Incomplete   Organism ID, Bacteria ENTEROCOCCUS SPECIES  Final      Susceptibility   Enterococcus species - MIC*    AMPICILLIN <=2 SENSITIVE Sensitive     VANCOMYCIN 1 SENSITIVE Sensitive     GENTAMICIN SYNERGY SENSITIVE Sensitive     LINEZOLID 2 SENSITIVE Sensitive     * ENTEROCOCCUS SPECIES  Blood Culture (routine x 2)     Status: None (Preliminary result)   Collection Time: 09/23/15 11:11 PM  Result Value Ref Range Status   Specimen Description BLOOD RIGHT HAND  Final   Special Requests IN PEDIATRIC BOTTLE 1CC  Final   Culture  Setup Time   Final    GRAM POSITIVE COCCI IN CHAINS PEDIATRIC BOTTLE CRITICAL RESULT CALLED TO, READ BACK BY AND VERIFIED WITH: T HOOD,RN AT 1250 09/24/15 BY L BENFIELD    Culture   Final    ENTEROCOCCUS SPECIES SUSCEPTIBILITIES PERFORMED ON PREVIOUS CULTURE WITHIN THE LAST 5 DAYS. YEAST IDENTIFICATION TO FOLLOW CRITICAL RESULT CALLED TO, READ BACK BY AND VERIFIED WITH: H MILLS,RN AT 1101 09/25/15 BY L BENFIELD    Report Status PENDING  Incomplete  Culture, respiratory (NON-Expectorated)     Status: None   Collection Time: 09/24/15 12:37 AM  Result Value Ref Range Status   Specimen  Description SPUTUM  Final   Special Requests SUCTIONED  Final   Gram Stain   Final    MODERATE WBC PRESENT, PREDOMINANTLY PMN FEW SQUAMOUS EPITHELIAL CELLS PRESENT FEW GRAM POSITIVE COCCI IN PAIRS FEW YEAST FEW GRAM NEGATIVE RODS Performed at [facility]    Culture   Final    NORMAL OROPHARYNGEAL FLORA Performed at [facility]    Report Status 09/26/2015 FINAL  Final  Culture, blood (routine x 2)     Status: None (Preliminary result)   Collection Time: 09/25/15  9:14 AM  Result Value Ref Range Status   Specimen Description BLOOD LEFT ANTECUBITAL  Final   Special Requests BOTTLES DRAWN AEROBIC ONLY 6CC  Final   Culture  Setup Time   Final    YEAST AEROBIC BOTTLE ONLY CRITICAL RESULT CALLED TO, READ BACK BY AND VERIFIED WITH: A AGUIRRE 09/26/15 @ 1250 M VESTAL    Culture YEAST  Final   Report Status PENDING  Incomplete  Culture, blood (routine x 2)     Status: None (Preliminary result)   Collection Time: 09/25/15  9:22 AM  Result Value Ref Range Status   Specimen Description BLOOD LEFT HAND  Final   Special Requests BOTTLES DRAWN AEROBIC ONLY 5CC  Final   Culture  Setup Time   Final    YEAST AEROBIC BOTTLE ONLY CRITICAL RESULT CALLED TO, READ BACK BY AND VERIFIED WITH: A AGUIRRE 09/26/15 @ 1250 M VESTAL    Culture YEAST  Final   Report Status PENDING  Incomplete     Studies: Dg Chest Port 1 View  09/25/2015  CLINICAL DATA:  60 year old with recent diagnosis of laryngeal cancer with indwelling tracheostomy, presenting 2 days ago with sepsis. Worsening hypoxia acutely today. EXAM: PORTABLE CHEST 1 VIEW COMPARISON:  09/23/2015 dating back to 08/29/2015. FINDINGS: Tracheostomy tube in satisfactory position below the thoracic inlet. Markedly suboptimal inspiration with atelectasis in the lung bases. Cardiac silhouette mildly enlarged even allowing for technique and degree of inspiration. Pulmonary venous hypertension and minimal to mild interstitial pulmonary  edema, new since 2 days ago. Streaky and patchy opacity at the left lung base. No confluent consolidation elsewhere. Note is made of slight  inferior subluxation of the left humeral head relative to the glenoid, not seen on prior examinations, though moderate to severe degenerative changes are present in the left glenohumeral joint. IMPRESSION: 1. Markedly suboptimal inspiration with bibasilar atelectasis. Possible developing bronchopneumonia at the left lung base. 2. Stable cardiomegaly. Pulmonary venous hypertension with minimal to mild interstitial pulmonary edema, query fluid overload. 3. Slight inferior subluxation of the left glenohumeral joint, associated with moderate to severe degenerative changes. Electronically Signed   By: Evangeline Dakin M.D.   On: 09/25/2015 18:12    Scheduled Meds: . alteplase  2 mg Intracatheter Once  . alteplase  2 mg Intracatheter Once  . ampicillin-sulbactam (UNASYN) IV  3 g Intravenous 4 times per day  . anidulafungin  100 mg Intravenous Q24H  . insulin aspart  0-9 Units Subcutaneous 6 times per day  . pantoprazole (PROTONIX) IV  40 mg Intravenous Q12H   Continuous Infusions: . dextrose 125 mL/hr at 09/27/15 0944   Antibiotics Given (last 72 hours)    Date/Time Action Medication Dose Rate   09/24/15 1632 Given   vancomycin (VANCOCIN) 1,250 mg in sodium chloride 0.9 % 250 mL IVPB 1,250 mg 166.7 mL/hr   09/24/15 2258 Given   ceFEPIme (MAXIPIME) 1 g in dextrose 5 % 50 mL IVPB 1 g 100 mL/hr   09/25/15 0452 Given   vancomycin (VANCOCIN) 1,250 mg in sodium chloride 0.9 % 250 mL IVPB 1,250 mg 166.7 mL/hr   09/25/15 [id number] Given   ceFEPIme (MAXIPIME) 1 g in dextrose 5 % 50 mL IVPB 1 g 100 mL/hr   09/25/15 1355 Given   Ampicillin-Sulbactam (UNASYN) 3 g in sodium chloride 0.9 % 100 mL IVPB 3 g 100 mL/hr   09/25/15 1842 Given   Ampicillin-Sulbactam (UNASYN) 3 g in sodium chloride 0.9 % 100 mL IVPB 3 g 100 mL/hr   09/25/15 2327 Given   Ampicillin-Sulbactam (UNASYN)  3 g in sodium chloride 0.9 % 100 mL IVPB 3 g 100 mL/hr   09/26/15 0525 Given   Ampicillin-Sulbactam (UNASYN) 3 g in sodium chloride 0.9 % 100 mL IVPB 3 g 100 mL/hr   09/26/15 1150 Given   Ampicillin-Sulbactam (UNASYN) 3 g in sodium chloride 0.9 % 100 mL IVPB 3 g 100 mL/hr   09/26/15 1715 Given   Ampicillin-Sulbactam (UNASYN) 3 g in sodium chloride 0.9 % 100 mL IVPB 3 g 100 mL/hr   09/27/15 0026 Given   Ampicillin-Sulbactam (UNASYN) 3 g in sodium chloride 0.9 % 100 mL IVPB 3 g 100 mL/hr   09/27/15 [id number] Given   Ampicillin-Sulbactam (UNASYN) 3 g in sodium chloride 0.9 % 100 mL IVPB 3 g 100 mL/hr   09/27/15 1243 Given   Ampicillin-Sulbactam (UNASYN) 3 g in sodium chloride 0.9 % 100 mL IVPB 3 g 100 mL/hr      Principal Problem:   Severe sepsis (HCC) Active Problems:   Type 2 diabetes mellitus without complication, without long-term current use of insulin (HCC)   AKI (acute kidney injury) ([place])   HCAP (healthcare-associated pneumonia)   DM2 (diabetes mellitus, type 2) ([place])    Time spent: 73min    Sophea Rackham  [facility] Pager [phone]. If 7PM-7AM, please contact night-coverage at [email or web address], password [facility] 09/27/2015, 1:18 PM  LOS: 4 days

## 2015-09-27 NOTE — Progress Notes (Signed)
Echocardiogram 2D Echocardiogram has been performed.  Tresa Res 09/27/2015, 11:56 AM

## 2015-09-28 LAB — CULTURE, BLOOD (ROUTINE X 2)

## 2015-09-28 LAB — GLUCOSE, CAPILLARY
GLUCOSE-CAPILLARY: 115 mg/dL — AB (ref 65–99)
GLUCOSE-CAPILLARY: 116 mg/dL — AB (ref 65–99)
Glucose-Capillary: 106 mg/dL — ABNORMAL HIGH (ref 65–99)
Glucose-Capillary: 110 mg/dL — ABNORMAL HIGH (ref 65–99)
Glucose-Capillary: 132 mg/dL — ABNORMAL HIGH (ref 65–99)
Glucose-Capillary: 135 mg/dL — ABNORMAL HIGH (ref 65–99)
Glucose-Capillary: 147 mg/dL — ABNORMAL HIGH (ref 65–99)

## 2015-09-28 LAB — BASIC METABOLIC PANEL
ANION GAP: 10 (ref 5–15)
BUN: 37 mg/dL — ABNORMAL HIGH (ref 6–20)
CHLORIDE: 123 mmol/L — AB (ref 101–111)
CO2: 23 mmol/L (ref 22–32)
Calcium: 8.6 mg/dL — ABNORMAL LOW (ref 8.9–10.3)
Creatinine, Ser: 1.48 mg/dL — ABNORMAL HIGH (ref 0.44–1.00)
GFR calc Af Amer: 44 mL/min — ABNORMAL LOW (ref >60)
GFR, EST NON AFRICAN AMERICAN: 38 mL/min — AB (ref >60)
GLUCOSE: 139 mg/dL — AB (ref 65–99)
POTASSIUM: 3.5 mmol/L (ref 3.5–5.1)
Sodium: 156 mmol/L — ABNORMAL HIGH (ref 135–145)

## 2015-09-28 MED ORDER — POTASSIUM CHLORIDE 10 MEQ/100ML IV SOLN
10.0000 meq | INTRAVENOUS | Status: AC
  Administered 2015-09-28 (×3): 10 meq via INTRAVENOUS
  Filled 2015-09-28 (×2): qty 100

## 2015-09-28 MED ORDER — WHITE PETROLATUM GEL
Status: AC
  Administered 2015-09-28: 0.2
  Filled 2015-09-28: qty 1

## 2015-09-28 MED ORDER — POTASSIUM CHLORIDE 10 MEQ/100ML IV SOLN
INTRAVENOUS | Status: AC
  Filled 2015-09-28: qty 100

## 2015-09-28 MED ORDER — FLUCONAZOLE IN SODIUM CHLORIDE 400-0.9 MG/200ML-% IV SOLN
400.0000 mg | INTRAVENOUS | Status: DC
  Filled 2015-09-28: qty 200

## 2015-09-28 MED ORDER — FLUCONAZOLE IN SODIUM CHLORIDE 400-0.9 MG/200ML-% IV SOLN
400.0000 mg | INTRAVENOUS | Status: DC
  Administered 2015-09-29 – 2015-10-11 (×13): 400 mg via INTRAVENOUS
  Filled 2015-09-28 (×15): qty 200

## 2015-09-28 MED ORDER — SODIUM CHLORIDE 0.9 % IV SOLN
2.0000 g | Freq: Four times a day (QID) | INTRAVENOUS | Status: DC
  Administered 2015-09-28 – 2015-09-29 (×4): 2 g via INTRAVENOUS
  Filled 2015-09-28 (×6): qty 2000

## 2015-09-28 NOTE — Progress Notes (Signed)
PARENTERAL NUTRITION CONSULT NOTE  and ABX consult  Pharmacy Consult for TPN Indication: Dysphagia/laryngeal and recent gastric and duod ulcers unable to have PEG acutely  No Known Allergies  Patient Measurements: Height: '5\' 3"'$  (160 cm) (Simultaneous filing. User may not have seen previous data.) Weight: 200 lb 9.9 oz (91 kg) (Simultaneous filing. User may not have seen previous data.) IBW/kg (Calculated) : 52.4 Vital Signs: Temp: 98 F (36.7 C) (02/15 0400) Temp Source: Oral (02/15 0400) BP: 119/69 mmHg (02/15 0557) Pulse Rate: 89 (02/15 0557) Intake/Output from previous day: 02/14 0701 - 02/15 0700 In: 3213 [I.V.:2683; IV Piggyback:530] Out: 750 [Urine:750] Intake/Output from this shift:    Labs:  Recent Labs  09/25/15 1550 09/26/15 0919 09/27/15 0229  WBC 17.8* 24.8* 26.0*  HGB 9.5* 11.0* 8.6*  HCT 29.8* 33.5* 26.5*  PLT 146* 135* 125*     Recent Labs  09/26/15 0919 09/27/15 0229 09/27/15 1600 09/28/15 0355  NA 160* 160* 159* 156*  K 3.9 3.4* 3.9 3.5  CL 125* 127* 127* 123*  CO2 19* 20* 20* 23  GLUCOSE 109* 121* 135* 139*  BUN 39* 45* 43* 37*  CREATININE 1.63* 1.64* 1.49* 1.48*  CALCIUM 9.4 8.8* 8.9 8.6*  MG 2.2  --   --   --   PHOS 3.2  --   --   --   PROT 6.7  --   --   --   ALBUMIN 2.0*  --   --   --   AST 49*  --   --   --   ALT 46  --   --   --   ALKPHOS 246*  --   --   --   BILITOT 0.8  --   --   --    Estimated Creatinine Clearance: 43.8 mL/min (by C-G formula based on Cr of 1.48).    Recent Labs  09/27/15 2047 09/28/15 0028 09/28/15 0511  GLUCAP 107* 135* 115*   Medications: PTA TPN  Insulin Requirements in the past 24 hours:  None  Assessment: 60 yo on TPN PTA. Pharmacy consulted to dose TPN for Dysphagia/laryngeal and recent gastric and duod ulcers unable to have PEG acutely.    GI: on TPN PTA for dysphagia, unable to place PEG due to gastric and duodenal ulcers. Albumin low at 2.0, prealbumin 5.2. Currently off TPN for line  holiday due to bacteremia. IV PPI  Endocrine:   PMH of DM2. CBGs better controlled today (100-130s). SSI, no insulin listed in home TPN formula; on 2 Units novolg TID w meals PTA. thyroid dz  Lytes:  wnl exc Na elevated but down to 156 (could be due to TPN on hold) and K 3.5. Mg and Phos ok on 2/13.  Renal: SCr elevated but down to 1.48, CrCl ~22m/min. D5 @ 1263mhr  Hepatic: Trig elevated at 271. LFTs wnl exc Alk Phos up to 246.  ID: Day #6 of abx for enterococcus bacteremia, HCAP vs tracheobronchitis. Afebrile yesterday, WBC up to 26.0  Zosyn x 1 2/10  Vanc 2/10 >> 2/12 Cefepime 2/11 >> 2/12 Unasyn 2/12 >> Anidulafungin 2/12 >>  2/10 BCx2 > enterococcus (sensitivities pending) 2/10 Ucx > insignificant growth 2/11 flu neg 2/10 S pneum Uag > collected 2/10 HIV 2/11 sputum> yeast, GNR, CPC 2/12 BCx2 > 2/2 yeast 2/13 BCx2 > ngtd  Nutritional Goals: per RD 09/24/15 KCal: 1700-1900 Protein: 90-105 g Fluid: 1.8-2.0 L  Current Nutrition:  NPO PTA TPN at 83 ml/hr IVFE at 10  ml/hr  This provides 100g of protein and ~1894 kcals which meets 100% of patient's needs.  Plan:  Continue to hold TPN and IVFE due to bacteremia/fungemia F/U new PICC placement after blood cx's ngtd for 72 hrs Need to reorder labs when TPN restarted  Elenor Quinones, PharmD, [facility] Clinical Pharmacist Pager [phone] 09/28/2015 7:18 AM

## 2015-09-28 NOTE — Progress Notes (Signed)
Pharmacy Antibiotic Note  Ashley Ramsey is a 60 y.o. female admitted on 09/23/2015 with SOB and cough.  Pharmacy has been consulted for unasyn dosing for enterococcal bacteremia.  She was previously on vanc/cefepime for HCAP/sepsis.  Also found to have fungemia and is on anidulafungin.  Day #6 of abx for enterococcus bacteremia and fungemia. Afebrile, WBC up to 26. Still awaiting TEE to rule out endocarditis. Plan to wait till blood cx's ngtd for 72 hrs before placing new PICC.  Plan:  Continue Unasyn 3g IV Q6 Continue anidulafungin 100mg  IV Q24 Monitor clinical picture, renal function F/U C&S, LOT, TEE   Height: 5\' 3"  (160 cm) (Simultaneous filing. User may not have seen previous data.) Weight: 200 lb 9.9 oz (91 kg) (Simultaneous filing. User may not have seen previous data.) IBW/kg (Calculated) : 52.4  Temp (24hrs), Avg:98 F (36.7 C), Min:97.4 F (36.3 C), Max:98.2 F (36.8 C)   Recent Labs Lab 09/23/15 1824 09/23/15 1949 09/23/15 2310 09/24/15 0107 09/24/15 0530 09/25/15 0430 09/25/15 1520 09/25/15 1550 09/26/15 0919 09/27/15 0229 09/27/15 1600 09/28/15 0355  WBC  --   --   --   --  17.9* 15.9*  --  17.8* 24.8* 26.0*  --   --   CREATININE  --   --   --   --  1.19* 1.12* 1.19*  --  1.63* 1.64* 1.49* 1.48*  LATICACIDVEN 3.77* 1.65 2.6* 1.4  --   --   --   --   --   --   --   --     Estimated Creatinine Clearance: 43.8 mL/min (by C-G formula based on Cr of 1.48).    No Known Allergies  Zosyn x 1 2/10 Vanc 2/10 >> 2/12 Cefepime 2/11 >> 2/12 Unasyn 2/12 >> Anidulafungin 2/12 >>  2/10 BCx2 > enterococcus (sensitivities pending) 2/10 Ucx > insignificant growth 2/11 flu neg 2/10 S pneum Uag > collected 2/10 HIV 2/11 sputum> yeast, GNR, CPC 2/12 BCx2 > 2/2 yeast 2/13 BCx2 > ngtd  Thank you for allowing pharmacy to be a part of this patient's care.  Elenor Quinones, PharmD, BCPS Clinical Pharmacist Pager [phone] 09/28/2015 7:17 AM

## 2015-09-28 NOTE — Progress Notes (Signed)
[facility] PROGRESS NOTE  SHAMIR KLIM [id number] DOB: 11/26/1955 DOA: 09/23/2015 PCP: Arnoldo Morale, MD   Subjective: Seen sitting at bedside, cough with a lot of secretions coming out of the tracheostomy.  HPI: Ashley Ramsey is a 60 year old female with a history of Type 2 DM (A1c 6.2), HTN was diagnosed with laryngeal cancer during a hospitalization at [facility] from 08/29/15 to 09/12/15. On CT neck she was found to have a laryngeal mass, CT abdomen revealed a left renal mass.  ENT was consulted-patient underwent a biopsy on 1/20 confirming malignancy. Hospital course was complicated by development of Massive upper  GI bleeding and associated blood loss anemia, s/p 5 units PRBC. GI was consulted, endoscopy revealed duodenal ulcer; she also had Gastroduodenal artery embolizatyion by IR.  She underwent tracheostomy on 09/04/2015; PEG tube could not be placed due to gastric and duodenal ulcers.  Seen by Oncology and Palliative medicine and Radiation Oncology then, Supposed to FU with ENT at [facility] now admitted with Sepsis, found to have Enterococcal and Yeast bacteremia. PICC removed, ID consutling  Assessment/Plan:  Enterococcal bacteremia and fungemia -likely PICC related -PICC removed 09/25/13 -Clinically improving -Continue Unasyn and antifungal per ID recommendations -Repeat blood cultures, line holiday -2-D echocardiogram complete, no evidence of vegetation, I need TEE if not improving.  Dysphagia -Patient has dysphagia, recommended to be NPO. -She was getting treatment through TPN, now PICC line discontinued for line holiday. -The PEG tube was not placed during last hospital stay because of presence of gastric ulcer on 09/03/2013 EGD.  -If patient continued to be stable will probably ask IR to place PEG tube.  Anemia  -Acute on chronic, multifactorial  -2 weeks ago found to have proximal stomach and duodenal ulcer, status post EGD on 09/04/2015, then required  Gastroduodenal Artery coiling/embolization by IR on 09-07-15, required 5units PRBC then -Keep on PPI, Transfused 2 units this admission, had some dark stools day before and this am RN reporting brown stool -anemia panel with showed anemia of chronic disease with iron deficiency with low TIBC and high ferritin   Invasive Sq Cell Laryngeal CA, suspicious lymph nodes involvement per ENT -seen by  ENT & oncology last month, underwent laryngoscopy/ biopsy on 1/20 -Followed by Dr.Rosen and he recommended FU with ENT Dr. Fenton Malling at [facility] for Eval for Surgery -s/p tracheostomy placement by ENT on 09/05/2015 -Seen by Oncology Dr.Gorsuch and Rad Onc, and Palliative consult completed -Pt and family wants full aggressive scope of Rx -per Dr.Gorsuch, Patient's prognosis is extremely poor, poor candidate for chemotherapy, also most likely has renal cancer.  -No PEG tube as she has stomach and duodenal ulcers now -was on TNA via PICC line -SLP eval for PMV and continued swallow eval.  -seen by Gi Dr.Mann and Hung then, i called and d/w Dr.Hung regarding J tube, he felt that if a J tube is needed then it would need to be done surgically, i doubt she would be considered for Surgical J tube in setting of Enterococcal and Yeast bacteremia at this time. -Called and d/w Dr.Gorsuch 2/13, she recommended Hospice, unfortunately pt and family want full Aggressive scope of Rx  Left renal mass: Highly suspicious for renal cell carcinoma.  -case d/w Dr. Ree Kida last admission, recommended outpatient follow-up with him in the office for consideration of nephrectomy in the future  DM -SSI for now  Morbid obesity:  -nutrition via TPN now  Hypernatremia -free water deficit, since NPO, increase d5W repeat  Bmet this afternoon and in am  DVT proph:  -SCDs, due to anemia, recent massive GI bleed  Code Status: FUll Code Family Communication: none at bedside, left message for daughter yesterday and  today Disposition Plan: Keep in SDU   Objective: Filed Vitals:   09/28/15 0557 09/28/15 0800  BP: 119/69 148/85  Pulse: 89 91  Temp:    Resp: 20 15    Intake/Output Summary (Last 24 hours) at 09/28/15 1244 Last data filed at 09/28/15 0800  Gross per 24 hour  Intake   2930 ml  Output    750 ml  Net   2180 ml   Filed Weights   09/23/15 1606 09/23/15 2253  Weight: 92.987 kg (205 lb) 91 kg (200 lb 9.9 oz)    Exam:   General:  AAOx3, chronically ill appearing, no distress  HEENT: around trach some phlegm noted  Cardiovascular: S1S2/RRR  Respiratory: conducted upper airway sounds  Abdomen: soft, NT, BS present  Musculoskeletal: no edema c/c   Data Reviewed: Basic Metabolic Panel:  Recent Labs Lab 09/25/15 0430 09/25/15 1520 09/26/15 0919 09/27/15 0229 09/27/15 1600 09/28/15 0355  NA 154* 155* 160* 160* 159* 156*  K 3.8 3.7 3.9 3.4* 3.9 3.5  CL 126* 123* 125* 127* 127* 123*  CO2 20* 20* 19* 20* 20* 23  GLUCOSE 216* 214* 109* 121* 135* 139*  BUN 42* 38* 39* 45* 43* 37*  CREATININE 1.12* 1.19* 1.63* 1.64* 1.49* 1.48*  CALCIUM 8.5* 9.0 9.4 8.8* 8.9 8.6*  MG 1.8  --  2.2  --   --   --   PHOS 2.5  --  3.2  --   --   --    Liver Function Tests:  Recent Labs Lab 09/23/15 1812 09/25/15 0430 09/26/15 0919  AST 31 28 49*  ALT 39 36 46  ALKPHOS 157* 144* 246*  BILITOT 0.6 0.1* 0.8  PROT 6.6 5.3* 6.7  ALBUMIN 1.8* 1.5* 2.0*   No results for input(s): LIPASE, AMYLASE in the last 168 hours. No results for input(s): AMMONIA in the last 168 hours. CBC:  Recent Labs Lab 09/23/15 1422 09/24/15 0530 09/25/15 0430 09/25/15 1550 09/26/15 0919 09/27/15 0229  WBC 28.0* 17.9* 15.9* 17.8* 24.8* 26.0*  NEUTROABS 24.1*  --   --   --   --   --   HGB 9.4* 7.1* 7.6* 9.5* 11.0* 8.6*  HCT 31.1* 23.1* 24.4* 29.8* 33.5* 26.5*  MCV 88.4 85.9 85.9 84.9 82.7 85.5  PLT 248 186 152 146* 135* 125*   Cardiac Enzymes: No results for input(s): CKTOTAL, CKMB, CKMBINDEX,  TROPONINI in the last 168 hours. BNP (last 3 results) No results for input(s): BNP in the last 8760 hours.  ProBNP (last 3 results) No results for input(s): PROBNP in the last 8760 hours.  CBG:  Recent Labs Lab 09/27/15 1627 09/27/15 2047 09/28/15 0028 09/28/15 0511 09/28/15 0734  GLUCAP 108* 107* 135* 115* 110*    Recent Results (from the past 240 hour(s))  Urine culture     Status: None   Collection Time: 09/23/15  6:40 PM  Result Value Ref Range Status   Specimen Description URINE, CATHETERIZED  Final   Special Requests NONE  Final   Culture 7,000 COLONIES/mL INSIGNIFICANT GROWTH  Final   Report Status 09/25/2015 FINAL  Final  Blood Culture (routine x 2)     Status: None   Collection Time: 09/23/15 11:10 PM  Result Value Ref Range Status   Specimen Description BLOOD LEFT  HAND  Final   Special Requests IN PEDIATRIC BOTTLE 3CC  Final   Culture  Setup Time   Final    GRAM POSITIVE COCCI IN CHAINS PEDIATRIC BOTTLE CRITICAL RESULT CALLED TO, READ BACK BY AND VERIFIED WITH: T HOOD,RN AT 1436 09/24/15 BY L BENFIELD    Culture   Final    ENTEROCOCCUS SPECIES CANDIDA ALBICANS CRITICAL RESULT CALLED TO, READ BACK BY AND VERIFIED WITH: H MILLS,RN AT 1101 09/25/15 BY L BENFIELD    Report Status 09/28/2015 FINAL  Final   Organism ID, Bacteria ENTEROCOCCUS SPECIES  Final      Susceptibility   Enterococcus species - MIC*    AMPICILLIN <=2 SENSITIVE Sensitive     VANCOMYCIN 1 SENSITIVE Sensitive     GENTAMICIN SYNERGY SENSITIVE Sensitive     LINEZOLID 2 SENSITIVE Sensitive     * ENTEROCOCCUS SPECIES  Blood Culture (routine x 2)     Status: None   Collection Time: 09/23/15 11:11 PM  Result Value Ref Range Status   Specimen Description BLOOD RIGHT HAND  Final   Special Requests IN PEDIATRIC BOTTLE 1CC  Final   Culture  Setup Time   Final    GRAM POSITIVE COCCI IN CHAINS PEDIATRIC BOTTLE CRITICAL RESULT CALLED TO, READ BACK BY AND VERIFIED WITH: T HOOD,RN AT 1250 09/24/15 BY L  BENFIELD    Culture   Final    ENTEROCOCCUS SPECIES SUSCEPTIBILITIES PERFORMED ON PREVIOUS CULTURE WITHIN THE LAST 5 DAYS. CANDIDA ALBICANS CRITICAL RESULT CALLED TO, READ BACK BY AND VERIFIED WITH: H MILLS,RN AT 1101 09/25/15 BY L BENFIELD    Report Status 09/28/2015 FINAL  Final  Culture, respiratory (NON-Expectorated)     Status: None   Collection Time: 09/24/15 12:37 AM  Result Value Ref Range Status   Specimen Description SPUTUM  Final   Special Requests SUCTIONED  Final   Gram Stain   Final    MODERATE WBC PRESENT, PREDOMINANTLY PMN FEW SQUAMOUS EPITHELIAL CELLS PRESENT FEW GRAM POSITIVE COCCI IN PAIRS FEW YEAST FEW GRAM NEGATIVE RODS Performed at [facility]    Culture   Final    NORMAL OROPHARYNGEAL FLORA Performed at [facility]    Report Status 09/26/2015 FINAL  Final  Culture, blood (routine x 2)     Status: None   Collection Time: 09/25/15  9:14 AM  Result Value Ref Range Status   Specimen Description BLOOD LEFT ANTECUBITAL  Final   Special Requests BOTTLES DRAWN AEROBIC ONLY 6CC  Final   Culture  Setup Time   Final    YEAST AEROBIC BOTTLE ONLY CRITICAL RESULT CALLED TO, READ BACK BY AND VERIFIED WITH: A AGUIRRE 09/26/15 @ [place]  Final   Report Status 09/28/2015 FINAL  Final  Culture, blood (routine x 2)     Status: None   Collection Time: 09/25/15  9:22 AM  Result Value Ref Range Status   Specimen Description BLOOD LEFT HAND  Final   Special Requests BOTTLES DRAWN AEROBIC ONLY 5CC  Final   Culture  Setup Time   Final    YEAST AEROBIC BOTTLE ONLY CRITICAL RESULT CALLED TO, READ BACK BY AND VERIFIED WITH: A AGUIRRE 09/26/15 @ [place]  Final   Report Status 09/28/2015 FINAL  Final  Culture, blood (routine x 2)     Status: None (Preliminary result)   Collection Time: 09/26/15  9:25 AM  Result Value Ref  Range Status   Specimen Description BLOOD RIGHT HAND  Final    Special Requests BOTTLES DRAWN AEROBIC ONLY 1CC  Final   Culture NO GROWTH 2 DAYS  Final   Report Status PENDING  Incomplete  Culture, blood (routine x 2)     Status: None (Preliminary result)   Collection Time: 09/26/15  9:35 AM  Result Value Ref Range Status   Specimen Description BLOOD RIGHT HAND  Final   Special Requests IN PEDIATRIC BOTTLE 1CC  Final   Culture  Setup Time   Final    YEAST AEROBIC BOTTLE ONLY CRITICAL RESULT CALLED TO, READ BACK BY AND VERIFIED WITH: A AGUIRRA 09/28/15 @ 61 M VESTAL    Culture YEAST  Final   Report Status PENDING  Incomplete     Studies: No results found.  Scheduled Meds: . alteplase  2 mg Intracatheter Once  . alteplase  2 mg Intracatheter Once  . ampicillin-sulbactam (UNASYN) IV  3 g Intravenous 4 times per day  . anidulafungin  100 mg Intravenous Q24H  . insulin aspart  0-9 Units Subcutaneous 6 times per day  . pantoprazole (PROTONIX) IV  40 mg Intravenous Q12H   Continuous Infusions: . dextrose 125 mL/hr at 09/28/15 1022   Antibiotics Given (last 72 hours)    Date/Time Action Medication Dose Rate   09/25/15 1355 Given   Ampicillin-Sulbactam (UNASYN) 3 g in sodium chloride 0.9 % 100 mL IVPB 3 g 100 mL/hr   09/25/15 1842 Given   Ampicillin-Sulbactam (UNASYN) 3 g in sodium chloride 0.9 % 100 mL IVPB 3 g 100 mL/hr   09/25/15 2327 Given   Ampicillin-Sulbactam (UNASYN) 3 g in sodium chloride 0.9 % 100 mL IVPB 3 g 100 mL/hr   09/26/15 0525 Given   Ampicillin-Sulbactam (UNASYN) 3 g in sodium chloride 0.9 % 100 mL IVPB 3 g 100 mL/hr   09/26/15 1150 Given   Ampicillin-Sulbactam (UNASYN) 3 g in sodium chloride 0.9 % 100 mL IVPB 3 g 100 mL/hr   09/26/15 1715 Given   Ampicillin-Sulbactam (UNASYN) 3 g in sodium chloride 0.9 % 100 mL IVPB 3 g 100 mL/hr   09/27/15 0026 Given   Ampicillin-Sulbactam (UNASYN) 3 g in sodium chloride 0.9 % 100 mL IVPB 3 g 100 mL/hr   09/27/15 [id number] Given   Ampicillin-Sulbactam (UNASYN) 3 g in sodium chloride 0.9 %  100 mL IVPB 3 g 100 mL/hr   09/27/15 1243 Given   Ampicillin-Sulbactam (UNASYN) 3 g in sodium chloride 0.9 % 100 mL IVPB 3 g 100 mL/hr   09/27/15 1738 Given   Ampicillin-Sulbactam (UNASYN) 3 g in sodium chloride 0.9 % 100 mL IVPB 3 g 100 mL/hr   09/28/15 0010 Given   Ampicillin-Sulbactam (UNASYN) 3 g in sodium chloride 0.9 % 100 mL IVPB 3 g 100 mL/hr   09/28/15 0510 Given   Ampicillin-Sulbactam (UNASYN) 3 g in sodium chloride 0.9 % 100 mL IVPB 3 g 100 mL/hr   09/28/15 1134 Given   Ampicillin-Sulbactam (UNASYN) 3 g in sodium chloride 0.9 % 100 mL IVPB 3 g 100 mL/hr      Principal Problem:   Severe sepsis (HCC) Active Problems:   Type 2 diabetes mellitus without complication, without long-term current use of insulin (HCC)   AKI (acute kidney injury) ([place])   HCAP (healthcare-associated pneumonia)   DM2 (diabetes mellitus, type 2) ([place])    Time spent: 59min    [facility] A  [facility] Pager [phone]. If 7PM-7AM, please contact night-coverage at [email or web address],  password [facility] 09/28/2015, 12:44 PM  LOS: 5 days

## 2015-09-28 NOTE — Progress Notes (Addendum)
[facility] for Infectious Disease    Date of Admission:  09/23/2015   Total days of antibiotics 6        Day 4 amp/sub        Day 4 anidulafungin           ID: Ashley Ramsey is a 60 y.o. 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 She had TTE yesterday No obvious vegetation although there is heavy calcification of the mitral valve annulus and AV which may obscure a vegetation .  Medications:  . alteplase  2 mg Intracatheter Once  . alteplase  2 mg Intracatheter Once  . ampicillin-sulbactam (UNASYN) IV  3 g Intravenous 4 times per day  . anidulafungin  100 mg Intravenous Q24H  . insulin aspart  0-9 Units Subcutaneous 6 times per day  . pantoprazole (PROTONIX) IV  40 mg Intravenous Q12H    Objective: Vital signs in last 24 hours: Temp:  [97.4 F (36.3 C)-98.2 F (36.8 C)] 98 F (36.7 C) (02/15 0400) Pulse Rate:  [89-110] 91 (02/15 0800) Resp:  [15-30] 15 (02/15 0800) BP: (118-148)/(58-85) 148/85 mmHg (02/15 0800) SpO2:  [96 %-100 %] 100 % (02/15 1146) FiO2 (%):  [28 %] 28 % (02/15 1146)  Physical Exam  Constitutional:  oriented to person, place, and time. appears well-developed and well-nourished. No distress. Sitting up in chair HENT: [place]/AT, PERRLA, no scleral icterus Neck: trach in place Mouth/Throat: Oropharynx is clear and moist. No oropharyngeal exudate.  Cardiovascular: Normal rate, regular rhythm and normal heart sounds. Exam reveals no gallop and no friction rub.  No murmur heard.  Pulmonary/Chest: Effort normal and breath sounds  normal. Rhonchi+  Neck = supple, no nuchal rigidity Abdominal: Soft. Bowel sounds are normal.  exhibits no distension. There is no tenderness.  Lymphadenopathy: no cervical adenopathy. No axillary adenopathy Neurological: alert and oriented to person, place, and time.  Skin: Skin is warm and dry. No rash noted. No erythema.  Psychiatric: a normal mood and affect.  behavior is normal.    Lab Results  Recent Labs  09/26/15 0919 09/27/15 0229 09/27/15 1600 09/28/15 0355  WBC 24.8* 26.0*  --   --   HGB 11.0* 8.6*  --   --   HCT 33.5* 26.5*  --   --   NA 160* 160* 159* 156*  K 3.9 3.4* 3.9 3.5  CL 125* 127* 127* 123*  CO2 19* 20* 20* 23  BUN 39* 45* 43* 37*  CREATININE 1.63* 1.64* 1.49* 1.48*   Liver Panel  Recent Labs  09/26/15 0919  PROT 6.7  ALBUMIN 2.0*  AST 49*  ALT 46  ALKPHOS 246*  BILITOT 0.8    Microbiology: 2/13 blood cx 1 of 4 bottles now with yeast 2/12 blood cx 2 of 2 sets: c.albicans 2/10 blood cx enterococcus amp S Studies/Results: No results found.   Assessment/Plan:  Leukocytosis/fevers =leukocytosis elevated but no longer having fevers. Will continue to monitor but could be due to inflammation also associated with untreated malignancy  Enterococcal bacteremia = will change to ampicillin, this is likely  due to picc line from TPN. Repeat cultures shows that it has cleared quickly. Plan on treating for 2 wk  Fungemia = due to c.albicans. Will switch to fluconazole 400mg  IV. Still fungemic on 2/13 likely after only receiving one dose of anidulafungin, though the picc line was removed when repeat cx were taken. Ideally would need TEE to evaluate for endocarditis. Not sure if she can undergo TEE due to laryngeal CA. Will repeat blood cx today. Plan to treat for minimum of 2 wk using 2/15 as day 1  Please do not place repeat picc line until blood cx from 2/15 are NGTD at 72hr.  Hypernatremia= improving.   aki = will continue to monitor  Tracheostomy  = consider getting speaking valve to use intermittently so that she can communicate with staff  Laryngeal cancer = defer to oncology to discuss if there is treatment options vs. Having palliative care consultation. Patient is unaware if her malignancy treatment options or if recs are for palliative care. i have spoken with Dr. Hartford Poli who will reach on oncology to circle back  Nutrition = ideally, TPN too high risk for reinfection. Consider getting surgery or GI back involved to see if she is a candidate for PEG or PEG-J.  Baxter Flattery [facility] Cell: [phone] Pager: [phone]  09/28/2015, 1:03 PM

## 2015-09-29 DIAGNOSIS — L8990 Pressure ulcer of unspecified site, unspecified stage: Secondary | ICD-10-CM | POA: Diagnosis present

## 2015-09-29 DIAGNOSIS — B377 Candidal sepsis: Secondary | ICD-10-CM

## 2015-09-29 DIAGNOSIS — B952 Enterococcus as the cause of diseases classified elsewhere: Secondary | ICD-10-CM

## 2015-09-29 DIAGNOSIS — R0902 Hypoxemia: Secondary | ICD-10-CM | POA: Diagnosis present

## 2015-09-29 DIAGNOSIS — N179 Acute kidney failure, unspecified: Secondary | ICD-10-CM

## 2015-09-29 DIAGNOSIS — C329 Malignant neoplasm of larynx, unspecified: Secondary | ICD-10-CM

## 2015-09-29 LAB — BASIC METABOLIC PANEL
Anion gap: 9 (ref 5–15)
BUN: 28 mg/dL — AB (ref 6–20)
CALCIUM: 8.1 mg/dL — AB (ref 8.9–10.3)
CO2: 22 mmol/L (ref 22–32)
CREATININE: 1.2 mg/dL — AB (ref 0.44–1.00)
Chloride: 125 mmol/L — ABNORMAL HIGH (ref 101–111)
GFR calc Af Amer: 56 mL/min — ABNORMAL LOW (ref >60)
GFR, EST NON AFRICAN AMERICAN: 48 mL/min — AB (ref >60)
GLUCOSE: 143 mg/dL — AB (ref 65–99)
Potassium: 3.5 mmol/L (ref 3.5–5.1)
Sodium: 156 mmol/L — ABNORMAL HIGH (ref 135–145)

## 2015-09-29 LAB — CBC
HCT: 25.7 % — ABNORMAL LOW (ref 36.0–46.0)
Hemoglobin: 8.1 g/dL — ABNORMAL LOW (ref 12.0–15.0)
MCH: 27 pg (ref 26.0–34.0)
MCHC: 31.5 g/dL (ref 30.0–36.0)
MCV: 85.7 fL (ref 78.0–100.0)
Platelets: 126 10*3/uL — ABNORMAL LOW (ref 150–400)
RBC: 3 MIL/uL — ABNORMAL LOW (ref 3.87–5.11)
RDW: 16.4 % — AB (ref 11.5–15.5)
WBC: 19.6 10*3/uL — ABNORMAL HIGH (ref 4.0–10.5)

## 2015-09-29 LAB — GLUCOSE, CAPILLARY
GLUCOSE-CAPILLARY: 126 mg/dL — AB (ref 65–99)
GLUCOSE-CAPILLARY: 97 mg/dL (ref 65–99)
Glucose-Capillary: 132 mg/dL — ABNORMAL HIGH (ref 65–99)
Glucose-Capillary: 133 mg/dL — ABNORMAL HIGH (ref 65–99)
Glucose-Capillary: 126 mg/dL — ABNORMAL HIGH (ref 65–99)

## 2015-09-29 LAB — MAGNESIUM: MAGNESIUM: 2.1 mg/dL (ref 1.7–2.4)

## 2015-09-29 MED ORDER — POTASSIUM CHLORIDE 10 MEQ/100ML IV SOLN
10.0000 meq | INTRAVENOUS | Status: AC
  Administered 2015-09-29 (×3): 10 meq via INTRAVENOUS
  Filled 2015-09-29 (×3): qty 100

## 2015-09-29 MED ORDER — SODIUM CHLORIDE 0.9 % IV SOLN
2.0000 g | INTRAVENOUS | Status: DC
  Administered 2015-09-29 – 2015-10-11 (×69): 2 g via INTRAVENOUS
  Filled 2015-09-29 (×78): qty 2000

## 2015-09-29 NOTE — Progress Notes (Signed)
INFECTIOUS DISEASE PROGRESS NOTE  ID: Ashley Ramsey is a 60 y.o. female with  Principal Problem:   Severe sepsis ([place]) Active Problems:   Type 2 diabetes mellitus without complication, without long-term current use of insulin (HCC)   AKI (acute kidney injury) ([place])   HCAP (healthcare-associated pneumonia)   DM2 (diabetes mellitus, type 2) (HCC)   Pressure ulcer  Subjective: Awake, alert, up in chair. No complaints.   Abtx:  Anti-infectives    Start     Dose/Rate Route Frequency Ordered Stop   09/29/15 1200  ampicillin (OMNIPEN) 2 g in sodium chloride 0.9 % 50 mL IVPB     2 g 150 mL/hr over 20 Minutes Intravenous 6 times per day 09/29/15 0825     09/29/15 1000  fluconazole (DIFLUCAN) IVPB 400 mg     400 mg 100 mL/hr over 120 Minutes Intravenous Every 24 hours 09/28/15 1333     09/28/15 1400  fluconazole (DIFLUCAN) IVPB 400 mg  Status:  Discontinued     400 mg 100 mL/hr over 120 Minutes Intravenous Every 24 hours 09/28/15 1326 09/28/15 1333   09/28/15 1400  ampicillin (OMNIPEN) 2 g in sodium chloride 0.9 % 50 mL IVPB  Status:  Discontinued     2 g 150 mL/hr over 20 Minutes Intravenous 4 times per day 09/28/15 1326 09/29/15 0825   09/26/15 1400  anidulafungin (ERAXIS) 100 mg in sodium chloride 0.9 % 100 mL IVPB  Status:  Discontinued     100 mg over 90 Minutes Intravenous Every 24 hours 09/25/15 1247 09/28/15 1326   09/25/15 1300  Ampicillin-Sulbactam (UNASYN) 3 g in sodium chloride 0.9 % 100 mL IVPB  Status:  Discontinued     3 g 100 mL/hr over 60 Minutes Intravenous 4 times per day 09/25/15 1226 09/28/15 1326   09/25/15 1230  anidulafungin (ERAXIS) 200 mg in sodium chloride 0.9 % 200 mL IVPB     200 mg over 180 Minutes Intravenous  Once 09/25/15 1225 09/25/15 1848   09/25/15 1215  anidulafungin (ERAXIS) 200 mg in sodium chloride 0.9 % 200 mL IVPB  Status:  Discontinued     200 mg over 180 Minutes Intravenous Every 24 hours 09/25/15 1214 09/25/15 1247   09/24/15 2200   ceFEPIme (MAXIPIME) 1 g in dextrose 5 % 50 mL IVPB  Status:  Discontinued     1 g 100 mL/hr over 30 Minutes Intravenous 3 times per day 09/24/15 1515 09/25/15 1216   09/24/15 1700  vancomycin (VANCOCIN) 1,250 mg in sodium chloride 0.9 % 250 mL IVPB  Status:  Discontinued     1,250 mg 166.7 mL/hr over 90 Minutes Intravenous Every 24 hours 09/23/15 2108 09/24/15 1515   09/24/15 1600  vancomycin (VANCOCIN) 1,250 mg in sodium chloride 0.9 % 250 mL IVPB  Status:  Discontinued     1,250 mg 166.7 mL/hr over 90 Minutes Intravenous Every 12 hours 09/24/15 1515 09/25/15 1216   09/24/15 0500  vancomycin (VANCOCIN) 1,250 mg in sodium chloride 0.9 % 250 mL IVPB  Status:  Discontinued     1,250 mg 166.7 mL/hr over 90 Minutes Intravenous Every 12 hours 09/23/15 1641 09/23/15 2107   09/24/15 0100  piperacillin-tazobactam (ZOSYN) IVPB 3.375 g  Status:  Discontinued     3.375 g 12.5 mL/hr over 240 Minutes Intravenous Every 8 hours 09/23/15 1641 09/23/15 2102   09/24/15 0100  ceFEPIme (MAXIPIME) 2 g in dextrose 5 % 50 mL IVPB  Status:  Discontinued  2 g 100 mL/hr over 30 Minutes Intravenous Every 12 hours 09/23/15 2105 09/24/15 1515   09/23/15 2115  vancomycin (VANCOCIN) 1,250 mg in sodium chloride 0.9 % 250 mL IVPB  Status:  Discontinued     1,250 mg 166.7 mL/hr over 90 Minutes Intravenous Every 24 hours 09/23/15 2107 09/23/15 2108   09/23/15 1645  vancomycin (VANCOCIN) 1,500 mg in sodium chloride 0.9 % 500 mL IVPB     1,500 mg 250 mL/hr over 120 Minutes Intravenous  Once 09/23/15 1630 09/23/15 1841   09/23/15 1630  piperacillin-tazobactam (ZOSYN) IVPB 3.375 g     3.375 g 100 mL/hr over 30 Minutes Intravenous  Once 09/23/15 1623 09/23/15 1709   09/23/15 1630  vancomycin (VANCOCIN) IVPB 1000 mg/200 mL premix  Status:  Discontinued     1,000 mg 200 mL/hr over 60 Minutes Intravenous  Once 09/23/15 1623 09/23/15 1630      Medications:  Scheduled: . alteplase  2 mg Intracatheter Once  . alteplase  2  mg Intracatheter Once  . ampicillin (OMNIPEN) IV  2 g Intravenous 6 times per day  . fluconazole (DIFLUCAN) IV  400 mg Intravenous Q24H  . insulin aspart  0-9 Units Subcutaneous 6 times per day  . pantoprazole (PROTONIX) IV  40 mg Intravenous Q12H    Objective: Vital signs in last 24 hours: Temp:  [97.4 F (36.3 C)-98.4 F (36.9 C)] 98.4 F (36.9 C) (02/16 0810) Pulse Rate:  [79-102] 84 (02/16 0810) Resp:  [16-24] 23 (02/16 0810) BP: (128-167)/(61-86) 156/80 mmHg (02/16 0810) SpO2:  [97 %-100 %] 100 % (02/16 0810) FiO2 (%):  [28 %] 28 % (02/16 0952)   General appearance: alert, cooperative and no distress Resp: rhonchi bilaterally Cardio: regular rate and rhythm GI: normal findings: bowel sounds normal and soft, non-tender Extremities: RUE PIC is clean, non-tender.   Lab Results  Recent Labs  09/27/15 0229  09/28/15 0355 09/29/15 0330  WBC 26.0*  --   --  19.6*  HGB 8.6*  --   --  8.1*  HCT 26.5*  --   --  25.7*  NA 160*  < > 156* 156*  K 3.4*  < > 3.5 3.5  CL 127*  < > 123* 125*  CO2 20*  < > 23 22  BUN 45*  < > 37* 28*  CREATININE 1.64*  < > 1.48* 1.20*  < > = values in this interval not displayed. Liver Panel No results for input(s): PROT, ALBUMIN, AST, ALT, ALKPHOS, BILITOT, BILIDIR, IBILI in the last 72 hours. Sedimentation Rate No results for input(s): ESRSEDRATE in the last 72 hours. C-Reactive Protein No results for input(s): CRP in the last 72 hours.  Microbiology: Recent Results (from the past 240 hour(s))  Urine culture     Status: None   Collection Time: 09/23/15  6:40 PM  Result Value Ref Range Status   Specimen Description URINE, CATHETERIZED  Final   Special Requests NONE  Final   Culture 7,000 COLONIES/mL INSIGNIFICANT GROWTH  Final   Report Status 09/25/2015 FINAL  Final  Blood Culture (routine x 2)     Status: None   Collection Time: 09/23/15 11:10 PM  Result Value Ref Range Status   Specimen Description BLOOD LEFT HAND  Final   Special  Requests IN PEDIATRIC BOTTLE 3CC  Final   Culture  Setup Time   Final    GRAM POSITIVE COCCI IN CHAINS PEDIATRIC BOTTLE CRITICAL RESULT CALLED TO, READ BACK BY AND VERIFIED WITH: T  HOOD,RN AT 1436 09/24/15 BY L BENFIELD    Culture   Final    ENTEROCOCCUS SPECIES CANDIDA ALBICANS CRITICAL RESULT CALLED TO, READ BACK BY AND VERIFIED WITH: H MILLS,RN AT 1101 09/25/15 BY L BENFIELD    Report Status 09/28/2015 FINAL  Final   Organism ID, Bacteria ENTEROCOCCUS SPECIES  Final      Susceptibility   Enterococcus species - MIC*    AMPICILLIN <=2 SENSITIVE Sensitive     VANCOMYCIN 1 SENSITIVE Sensitive     GENTAMICIN SYNERGY SENSITIVE Sensitive     LINEZOLID 2 SENSITIVE Sensitive     * ENTEROCOCCUS SPECIES  Blood Culture (routine x 2)     Status: None   Collection Time: 09/23/15 11:11 PM  Result Value Ref Range Status   Specimen Description BLOOD RIGHT HAND  Final   Special Requests IN PEDIATRIC BOTTLE 1CC  Final   Culture  Setup Time   Final    GRAM POSITIVE COCCI IN CHAINS PEDIATRIC BOTTLE CRITICAL RESULT CALLED TO, READ BACK BY AND VERIFIED WITH: T HOOD,RN AT 1250 09/24/15 BY L BENFIELD    Culture   Final    ENTEROCOCCUS SPECIES SUSCEPTIBILITIES PERFORMED ON PREVIOUS CULTURE WITHIN THE LAST 5 DAYS. CANDIDA ALBICANS CRITICAL RESULT CALLED TO, READ BACK BY AND VERIFIED WITH: H MILLS,RN AT 1101 09/25/15 BY L BENFIELD    Report Status 09/28/2015 FINAL  Final  Culture, respiratory (NON-Expectorated)     Status: None   Collection Time: 09/24/15 12:37 AM  Result Value Ref Range Status   Specimen Description SPUTUM  Final   Special Requests SUCTIONED  Final   Gram Stain   Final    MODERATE WBC PRESENT, PREDOMINANTLY PMN FEW SQUAMOUS EPITHELIAL CELLS PRESENT FEW GRAM POSITIVE COCCI IN PAIRS FEW YEAST FEW GRAM NEGATIVE RODS Performed at [facility]    Culture   Final    NORMAL OROPHARYNGEAL FLORA Performed at [facility]    Report Status 09/26/2015 FINAL  Final    Culture, blood (routine x 2)     Status: None   Collection Time: 09/25/15  9:14 AM  Result Value Ref Range Status   Specimen Description BLOOD LEFT ANTECUBITAL  Final   Special Requests BOTTLES DRAWN AEROBIC ONLY 6CC  Final   Culture  Setup Time   Final    YEAST AEROBIC BOTTLE ONLY CRITICAL RESULT CALLED TO, READ BACK BY AND VERIFIED WITH: A AGUIRRE 09/26/15 @ [place]  Final   Report Status 09/28/2015 FINAL  Final  Culture, blood (routine x 2)     Status: None   Collection Time: 09/25/15  9:22 AM  Result Value Ref Range Status   Specimen Description BLOOD LEFT HAND  Final   Special Requests BOTTLES DRAWN AEROBIC ONLY 5CC  Final   Culture  Setup Time   Final    YEAST AEROBIC BOTTLE ONLY CRITICAL RESULT CALLED TO, READ BACK BY AND VERIFIED WITH: A AGUIRRE 09/26/15 @ [place]  Final   Report Status 09/28/2015 FINAL  Final  Culture, blood (routine x 2)     Status: None (Preliminary result)   Collection Time: 09/26/15  9:25 AM  Result Value Ref Range Status   Specimen Description BLOOD RIGHT HAND  Final   Special Requests BOTTLES DRAWN AEROBIC ONLY 1CC  Final   Culture  Setup Time   Final    BUDDING YEAST SEEN PEDIATRIC BOTTLE CRITICAL RESULT CALLED  TO, READ BACK BY AND VERIFIED WITH: A AGUIERRE,RN AT 1422 09/28/15 BY L BENFIELD    Culture YEAST  Final   Report Status PENDING  Incomplete  Culture, blood (routine x 2)     Status: None (Preliminary result)   Collection Time: 09/26/15  9:35 AM  Result Value Ref Range Status   Specimen Description BLOOD RIGHT HAND  Final   Special Requests IN PEDIATRIC BOTTLE 1CC  Final   Culture  Setup Time   Final    YEAST AEROBIC BOTTLE ONLY CRITICAL RESULT CALLED TO, READ BACK BY AND VERIFIED WITH: A AGUIRRA 09/28/15 @ 58 M [facility]    Culture YEAST  Final   Report Status PENDING  Incomplete  Culture, blood (routine x 2)     Status: None (Preliminary result)   Collection  Time: 09/28/15  3:20 PM  Result Value Ref Range Status   Specimen Description BLOOD RIGHT ARM  Final   Special Requests IN PEDIATRIC BOTTLE 3CC  Final   Culture NO GROWTH < 24 HOURS  Final   Report Status PENDING  Incomplete  Culture, blood (routine x 2)     Status: None (Preliminary result)   Collection Time: 09/28/15  3:37 PM  Result Value Ref Range Status   Specimen Description BLOOD RIGHT HAND  Final   Special Requests IN PEDIATRIC BOTTLE 3CC  Final   Culture NO GROWTH < 24 HOURS  Final   Report Status PENDING  Incomplete    Studies/Results: No results found.   Assessment/Plan: Laryngeal Ca Further options, palliation being considered.  Home TPN  Candidemia Enterococcal Bacteremia Her wbc is improving Will plan to continue diflucan and ampicillin for 1 month  This is longer course given she had line related infection however her TTE was unclear and it is not clear if she can tolerate TEE.  Replace line in 48 h if her repeat BCx remain negative.   ARF Cr is improving  Total days of antibiotics: 6 (flucon/amp)         Bobby Rumpf Infectious Diseases (pager) [phone] [email or web address] 09/29/2015, 12:04 PM  LOS: 6 days

## 2015-09-29 NOTE — Progress Notes (Signed)
PARENTERAL NUTRITION CONSULT NOTE  and ABX consult  Pharmacy Consult for TPN Indication: Dysphagia/laryngeal and recent gastric and duod ulcers unable to have PEG acutely  No Known Allergies  Patient Measurements: Height: 5' 3" (160 cm) (Simultaneous filing. User may not have seen previous data.) Weight: 200 lb 9.9 oz (91 kg) (Simultaneous filing. User may not have seen previous data.) IBW/kg (Calculated) : 52.4 Vital Signs: Temp: 98.1 F (36.7 C) (02/16 0355) Temp Source: Oral (02/16 0355) BP: 153/80 mmHg (02/16 0400) Pulse Rate: 79 (02/16 0400) Intake/Output from previous day: 02/15 0701 - 02/16 0700 In: 3480 [I.V.:2750; IV Piggyback:730] Out: 475 [Urine:475] Intake/Output from this shift:    Labs:  Recent Labs  09/26/15 0919 09/27/15 0229 09/29/15 0330  WBC 24.8* 26.0* 19.6*  HGB 11.0* 8.6* 8.1*  HCT 33.5* 26.5* 25.7*  PLT 135* 125* 126*     Recent Labs  09/26/15 0919  09/27/15 1600 09/28/15 0355 09/29/15 0330  NA 160*  < > 159* 156* 156*  K 3.9  < > 3.9 3.5 3.5  CL 125*  < > 127* 123* 125*  CO2 19*  < > 20* 23 22  GLUCOSE 109*  < > 135* 139* 143*  BUN 39*  < > 43* 37* 28*  CREATININE 1.63*  < > 1.49* 1.48* 1.20*  CALCIUM 9.4  < > 8.9 8.6* 8.1*  MG 2.2  --   --   --  2.1  PHOS 3.2  --   --   --   --   PROT 6.7  --   --   --   --   ALBUMIN 2.0*  --   --   --   --   AST 49*  --   --   --   --   ALT 46  --   --   --   --   ALKPHOS 246*  --   --   --   --   BILITOT 0.8  --   --   --   --   < > = values in this interval not displayed. Estimated Creatinine Clearance: 54 mL/min (by C-G formula based on Cr of 1.2).    Recent Labs  09/28/15 2005 09/28/15 2354 09/29/15 0440  GLUCAP 132* 147* 132*   Medications: PTA TPN  Insulin Requirements in the past 24 hours:  None  Assessment: 60 yo on TPN PTA. Pharmacy consulted to dose TPN for Dysphagia/laryngeal and recent gastric and duod ulcers unable to have PEG acutely.    GI: on TPN PTA for  dysphagia, unable to place PEG due to gastric and duodenal ulcers. Albumin low at 2.0, prealbumin 5.2. Currently off TPN for line holiday due to bacteremia. IV PPI  Endocrine:   PMH of DM2. CBGs better controlled today (100-140s). SSI, no insulin listed in home TPN formula; on 2 Units novolg TID w meals PTA. thyroid dz  Lytes:  wnl exc Na elevated but down to 156 (could be due to TPN on hold) and K 3.5. Mg ok at 2.1  Renal: SCr elevated but down to 1.48, CrCl ~57m/min. D5 @ 1294mhr  Hepatic: Trig elevated at 271. LFTs wnl exc Alk Phos up to 246.  ID: Day #7 of abx for enterococcus bacteremia, HCAP vs tracheobronchitis. Afebrile yesterday, WBC down to 19.6.  Zosyn x 1 2/10  Vanc 2/10 >> 2/12 Cefepime 2/11 >> 2/12 Unasyn 2/12 >> 2/15 Anidulafungin 2/12 >> 2/15 Ampicillin 2/15 >> Fluconazole 2/15 >>  2/10 BCx2 >  enterococcus (sensitivities pending) 2/10 Ucx > insignificant growth 2/11 flu neg 2/10 S pneum Uag > collected 2/10 HIV 2/11 sputum> yeast, GNR, CPC 2/12 BCx2 > 2/2 yeast 2/13 BCx2 > 2/2 yeast 2/15 BCx2 > sent  Nutritional Goals: per RD 09/24/15 KCal: 1700-1900 Protein: 90-105 g Fluid: 1.8-2.0 L  Current Nutrition:  NPO D5 at 182m/hr  Plan:  Continue D5 at 1250mhr Continue to hold TPN and IVFE due to bacteremia/fungemia F/U new PICC placement after blood cx's ngtd for 72 hrs Need to reorder labs when TPN restarted  NaElenor QuinonesPharmD, BCPS Clinical Pharmacist Pager [phone]/16/2017 7:03 AM

## 2015-09-29 NOTE — Progress Notes (Signed)
eLink Pharmacist-Brief Progress Note Patient Name: AMOS FRERICKS DOB: 30-Nov-1955 MRN: [id number]   Date of Service  09/29/2015  HPI/Events of Note  60 y/o F w/ hx of GI bleed. No DVT prophylaxis ordered.   eICU Interventions  scds ordered   eLink Provider: Delora Fuel, PharmD 09/29/2015, 3:26 PM

## 2015-09-29 NOTE — Progress Notes (Signed)
[facility] PROGRESS NOTE  RAKIYAH Ramsey [id number] DOB: May 13, 1956 DOA: 09/23/2015 PCP: Arnoldo Morale, MD   Subjective: Does not have any complaints today no fever or chills.  HPI: Ashley Ramsey is a 60 year old female with a history of Type 2 DM (A1c 6.2), HTN was diagnosed with laryngeal cancer during a hospitalization at [facility] from 08/29/15 to 09/12/15. On CT neck she was found to have a laryngeal mass, CT abdomen revealed a left renal mass.  ENT was consulted-patient underwent a biopsy on 1/20 confirming malignancy. Hospital course was complicated by development of Massive upper  GI bleeding and associated blood loss anemia, s/p 5 units PRBC. GI was consulted, endoscopy revealed duodenal ulcer; she also had Gastroduodenal artery embolizatyion by IR.  She underwent tracheostomy on 09/04/2015; PEG tube could not be placed due to gastric and duodenal ulcers.  Seen by Oncology and Palliative medicine and Radiation Oncology then, Supposed to FU with ENT at [facility] now admitted with Sepsis, found to have Enterococcal and Yeast bacteremia. PICC removed, ID consutling  Assessment/Plan:  Enterococcal bacteremia and fungemia -likely PICC related -PICC removed 09/25/13 -Clinically improving -Continue Unasyn and antifungal per ID recommendations -Repeat blood cultures, line holiday -2-D echocardiogram complete, no evidence of vegetation, per ID continue antibiotics and antifungal for one more month.  Dysphagia -Patient has dysphagia, recommended to be NPO. -She was getting treatment through TPN, now PICC line discontinued for line holiday. -The PEG tube was not placed during last hospital stay because of presence of gastric ulcer on 09/03/2013 EGD.  -Per notes GI recommended surgical placement of the tube.  Anemia  -Acute on chronic, multifactorial  -2 weeks ago found to have proximal stomach and duodenal ulcer, status post EGD on 09/04/2015, then required Gastroduodenal Artery  coiling/embolization by IR on 09-07-15, required 5units PRBC then -Keep on PPI, Transfused 2 units this admission, had some dark stools day before and this am RN reporting brown stool -anemia panel with showed anemia of chronic disease with iron deficiency with low TIBC and high ferritin   Invasive Sq Cell Laryngeal CA, suspicious lymph nodes involvement per ENT -seen by  ENT & oncology last month, underwent laryngoscopy/ biopsy on 1/20 -Followed by Dr.Rosen and he recommended FU with ENT Dr. Fenton Malling at [facility] for Eval for Surgery -s/p tracheostomy placement by ENT on 09/05/2015 -Seen by Oncology Dr.Gorsuch and Rad Onc, and Palliative consult completed -Pt and family wants full aggressive scope of Rx -per Dr.Gorsuch, Patient's prognosis is extremely poor, poor candidate for chemotherapy, also most likely has renal cancer.  -No PEG tube as she has stomach and duodenal ulcers now -Was on TNA via PICC line -SLP eval for PMV and continued swallow eval.  -Seen by GI Dr.Mann and Hung then, I called and d/w Dr. Benson Norway regarding J tube, he felt that if a J tube is needed then it would need to be done surgically, I doubt she would be considered for Surgical J tube in setting of Enterococcal and Yeast bacteremia at this time. -Called and d/w Dr.Gorsuch 2/13, she recommended Hospice, unfortunately pt and family want full Aggressive scope of Rx.  Left renal mass: Highly suspicious for renal cell carcinoma.  -case d/w Dr. Ree Kida last admission, recommended outpatient follow-up with him in the office for consideration of nephrectomy in the future  DM -SSI for now  Morbid obesity:  -nutrition via TPN now  Hypernatremia -free water deficit, since NPO, increase d5W   DVT proph:  -SCDs,  due to anemia, recent massive GI bleed  Code Status: FUll Code Family Communication: none at bedside, left message for daughter yesterday and today Disposition Plan: Keep in SDU   Objective: Filed  Vitals:   09/29/15 0605 09/29/15 0810  BP: 167/83 156/80  Pulse: 86 84  Temp:  98.4 F (36.9 C)  Resp: 21 23    Intake/Output Summary (Last 24 hours) at 09/29/15 1231 Last data filed at 09/29/15 1024  Gross per 24 hour  Intake   3255 ml  Output    775 ml  Net   2480 ml   Filed Weights   09/23/15 1606 09/23/15 2253  Weight: 92.987 kg (205 lb) 91 kg (200 lb 9.9 oz)    Exam:   General:  AAOx3, chronically ill appearing, no distress  HEENT: around trach some phlegm noted  Cardiovascular: S1S2/RRR  Respiratory: conducted upper airway sounds  Abdomen: soft, NT, BS present  Musculoskeletal: no edema c/c   Data Reviewed: Basic Metabolic Panel:  Recent Labs Lab 09/25/15 0430  09/26/15 0919 09/27/15 0229 09/27/15 1600 09/28/15 0355 09/29/15 0330  NA 154*  < > 160* 160* 159* 156* 156*  K 3.8  < > 3.9 3.4* 3.9 3.5 3.5  CL 126*  < > 125* 127* 127* 123* 125*  CO2 20*  < > 19* 20* 20* 23 22  GLUCOSE 216*  < > 109* 121* 135* 139* 143*  BUN 42*  < > 39* 45* 43* 37* 28*  CREATININE 1.12*  < > 1.63* 1.64* 1.49* 1.48* 1.20*  CALCIUM 8.5*  < > 9.4 8.8* 8.9 8.6* 8.1*  MG 1.8  --  2.2  --   --   --  2.1  PHOS 2.5  --  3.2  --   --   --   --   < > = values in this interval not displayed. Liver Function Tests:  Recent Labs Lab 09/23/15 1812 09/25/15 0430 09/26/15 0919  AST 31 28 49*  ALT 39 36 46  ALKPHOS 157* 144* 246*  BILITOT 0.6 0.1* 0.8  PROT 6.6 5.3* 6.7  ALBUMIN 1.8* 1.5* 2.0*   No results for input(s): LIPASE, AMYLASE in the last 168 hours. No results for input(s): AMMONIA in the last 168 hours. CBC:  Recent Labs Lab 09/23/15 1422  09/25/15 0430 09/25/15 1550 09/26/15 0919 09/27/15 0229 09/29/15 0330  WBC 28.0*  < > 15.9* 17.8* 24.8* 26.0* 19.6*  NEUTROABS 24.1*  --   --   --   --   --   --   HGB 9.4*  < > 7.6* 9.5* 11.0* 8.6* 8.1*  HCT 31.1*  < > 24.4* 29.8* 33.5* 26.5* 25.7*  MCV 88.4  < > 85.9 84.9 82.7 85.5 85.7  PLT 248  < > 152 146* 135*  125* 126*  < > = values in this interval not displayed. Cardiac Enzymes: No results for input(s): CKTOTAL, CKMB, CKMBINDEX, TROPONINI in the last 168 hours. BNP (last 3 results) No results for input(s): BNP in the last 8760 hours.  ProBNP (last 3 results) No results for input(s): PROBNP in the last 8760 hours.  CBG:  Recent Labs Lab 09/28/15 1722 09/28/15 2005 09/28/15 2354 09/29/15 0440 09/29/15 0809  GLUCAP 116* 132* 147* 132* 133*    Recent Results (from the past 240 hour(s))  Urine culture     Status: None   Collection Time: 09/23/15  6:40 PM  Result Value Ref Range Status   Specimen Description URINE, CATHETERIZED  Final   Special Requests NONE  Final   Culture 7,000 COLONIES/mL INSIGNIFICANT GROWTH  Final   Report Status 09/25/2015 FINAL  Final  Blood Culture (routine x 2)     Status: None   Collection Time: 09/23/15 11:10 PM  Result Value Ref Range Status   Specimen Description BLOOD LEFT HAND  Final   Special Requests IN PEDIATRIC BOTTLE 3CC  Final   Culture  Setup Time   Final    GRAM POSITIVE COCCI IN CHAINS PEDIATRIC BOTTLE CRITICAL RESULT CALLED TO, READ BACK BY AND VERIFIED WITH: T HOOD,RN AT 1436 09/24/15 BY L BENFIELD    Culture   Final    ENTEROCOCCUS SPECIES CANDIDA ALBICANS CRITICAL RESULT CALLED TO, READ BACK BY AND VERIFIED WITH: H MILLS,RN AT 1101 09/25/15 BY L BENFIELD    Report Status 09/28/2015 FINAL  Final   Organism ID, Bacteria ENTEROCOCCUS SPECIES  Final      Susceptibility   Enterococcus species - MIC*    AMPICILLIN <=2 SENSITIVE Sensitive     VANCOMYCIN 1 SENSITIVE Sensitive     GENTAMICIN SYNERGY SENSITIVE Sensitive     LINEZOLID 2 SENSITIVE Sensitive     * ENTEROCOCCUS SPECIES  Blood Culture (routine x 2)     Status: None   Collection Time: 09/23/15 11:11 PM  Result Value Ref Range Status   Specimen Description BLOOD RIGHT HAND  Final   Special Requests IN PEDIATRIC BOTTLE 1CC  Final   Culture  Setup Time   Final    GRAM  POSITIVE COCCI IN CHAINS PEDIATRIC BOTTLE CRITICAL RESULT CALLED TO, READ BACK BY AND VERIFIED WITH: T HOOD,RN AT 1250 09/24/15 BY L BENFIELD    Culture   Final    ENTEROCOCCUS SPECIES SUSCEPTIBILITIES PERFORMED ON PREVIOUS CULTURE WITHIN THE LAST 5 DAYS. CANDIDA ALBICANS CRITICAL RESULT CALLED TO, READ BACK BY AND VERIFIED WITH: H MILLS,RN AT 1101 09/25/15 BY L BENFIELD    Report Status 09/28/2015 FINAL  Final  Culture, respiratory (NON-Expectorated)     Status: None   Collection Time: 09/24/15 12:37 AM  Result Value Ref Range Status   Specimen Description SPUTUM  Final   Special Requests SUCTIONED  Final   Gram Stain   Final    MODERATE WBC PRESENT, PREDOMINANTLY PMN FEW SQUAMOUS EPITHELIAL CELLS PRESENT FEW GRAM POSITIVE COCCI IN PAIRS FEW YEAST FEW GRAM NEGATIVE RODS Performed at [facility]    Culture   Final    NORMAL OROPHARYNGEAL FLORA Performed at [facility]    Report Status 09/26/2015 FINAL  Final  Culture, blood (routine x 2)     Status: None   Collection Time: 09/25/15  9:14 AM  Result Value Ref Range Status   Specimen Description BLOOD LEFT ANTECUBITAL  Final   Special Requests BOTTLES DRAWN AEROBIC ONLY 6CC  Final   Culture  Setup Time   Final    YEAST AEROBIC BOTTLE ONLY CRITICAL RESULT CALLED TO, READ BACK BY AND VERIFIED WITH: A AGUIRRE 09/26/15 @ [place]  Final   Report Status 09/28/2015 FINAL  Final  Culture, blood (routine x 2)     Status: None   Collection Time: 09/25/15  9:22 AM  Result Value Ref Range Status   Specimen Description BLOOD LEFT HAND  Final   Special Requests BOTTLES DRAWN AEROBIC ONLY 5CC  Final   Culture  Setup Time   Final    YEAST AEROBIC BOTTLE ONLY CRITICAL RESULT CALLED  TO, READ BACK BY AND VERIFIED WITH: A AGUIRRE 09/26/15 @ 1250 M [place]  Final   Report Status 09/28/2015 FINAL  Final  Culture, blood (routine x 2)     Status: None (Preliminary  result)   Collection Time: 09/26/15  9:25 AM  Result Value Ref Range Status   Specimen Description BLOOD RIGHT HAND  Final   Special Requests BOTTLES DRAWN AEROBIC ONLY 1CC  Final   Culture  Setup Time   Final    BUDDING YEAST SEEN PEDIATRIC BOTTLE CRITICAL RESULT CALLED TO, READ BACK BY AND VERIFIED WITH: A AGUIERRE,RN AT 1422 09/28/15 BY L BENFIELD    Culture YEAST  Final   Report Status PENDING  Incomplete  Culture, blood (routine x 2)     Status: None (Preliminary result)   Collection Time: 09/26/15  9:35 AM  Result Value Ref Range Status   Specimen Description BLOOD RIGHT HAND  Final   Special Requests IN PEDIATRIC BOTTLE 1CC  Final   Culture  Setup Time   Final    YEAST AEROBIC BOTTLE ONLY CRITICAL RESULT CALLED TO, READ BACK BY AND VERIFIED WITH: A AGUIRRA 09/28/15 @ 33 M [place]    Culture YEAST  Final   Report Status PENDING  Incomplete  Culture, blood (routine x 2)     Status: None (Preliminary result)   Collection Time: 09/28/15  3:20 PM  Result Value Ref Range Status   Specimen Description BLOOD RIGHT ARM  Final   Special Requests IN PEDIATRIC BOTTLE 3CC  Final   Culture NO GROWTH < 24 HOURS  Final   Report Status PENDING  Incomplete  Culture, blood (routine x 2)     Status: None (Preliminary result)   Collection Time: 09/28/15  3:37 PM  Result Value Ref Range Status   Specimen Description BLOOD RIGHT HAND  Final   Special Requests IN PEDIATRIC BOTTLE 3CC  Final   Culture NO GROWTH < 24 HOURS  Final   Report Status PENDING  Incomplete     Studies: No results found.  Scheduled Meds: . alteplase  2 mg Intracatheter Once  . alteplase  2 mg Intracatheter Once  . ampicillin (OMNIPEN) IV  2 g Intravenous 6 times per day  . fluconazole (DIFLUCAN) IV  400 mg Intravenous Q24H  . insulin aspart  0-9 Units Subcutaneous 6 times per day  . pantoprazole (PROTONIX) IV  40 mg Intravenous Q12H   Continuous Infusions: . dextrose 125 mL/hr at 09/29/15 0100   Antibiotics  Given (last 72 hours)    Date/Time Action Medication Dose Rate   09/26/15 1715 Given   Ampicillin-Sulbactam (UNASYN) 3 g in sodium chloride 0.9 % 100 mL IVPB 3 g 100 mL/hr   09/27/15 0026 Given   Ampicillin-Sulbactam (UNASYN) 3 g in sodium chloride 0.9 % 100 mL IVPB 3 g 100 mL/hr   09/27/15 [id number] Given   Ampicillin-Sulbactam (UNASYN) 3 g in sodium chloride 0.9 % 100 mL IVPB 3 g 100 mL/hr   09/27/15 1243 Given   Ampicillin-Sulbactam (UNASYN) 3 g in sodium chloride 0.9 % 100 mL IVPB 3 g 100 mL/hr   09/27/15 1738 Given   Ampicillin-Sulbactam (UNASYN) 3 g in sodium chloride 0.9 % 100 mL IVPB 3 g 100 mL/hr   09/28/15 0010 Given   Ampicillin-Sulbactam (UNASYN) 3 g in sodium chloride 0.9 % 100 mL IVPB 3 g 100 mL/hr   09/28/15 0510 Given   Ampicillin-Sulbactam (UNASYN) 3 g in sodium chloride  0.9 % 100 mL IVPB 3 g 100 mL/hr   09/28/15 1134 Given   Ampicillin-Sulbactam (UNASYN) 3 g in sodium chloride 0.9 % 100 mL IVPB 3 g 100 mL/hr   09/28/15 1529 Given   ampicillin (OMNIPEN) 2 g in sodium chloride 0.9 % 50 mL IVPB 2 g 150 mL/hr   09/28/15 1833 Given   ampicillin (OMNIPEN) 2 g in sodium chloride 0.9 % 50 mL IVPB 2 g 150 mL/hr   09/29/15 0000 Given   ampicillin (OMNIPEN) 2 g in sodium chloride 0.9 % 50 mL IVPB 2 g 150 mL/hr   09/29/15 0500 Given   ampicillin (OMNIPEN) 2 g in sodium chloride 0.9 % 50 mL IVPB 2 g 150 mL/hr   09/29/15 1207 Given   ampicillin (OMNIPEN) 2 g in sodium chloride 0.9 % 50 mL IVPB 2 g 150 mL/hr      Principal Problem:   Severe sepsis (HCC) Active Problems:   Type 2 diabetes mellitus without complication, without long-term current use of insulin (HCC)   AKI (acute kidney injury) ([place])   HCAP (healthcare-associated pneumonia)   DM2 (diabetes mellitus, type 2) ([place])   Pressure ulcer    Time spent: 39min    Raquel Racey A  [facility] Pager [phone]. If 7PM-7AM, please contact night-coverage at [email or web address], password [facility] 09/29/2015, 12:31 PM  LOS: 6  days

## 2015-09-30 DIAGNOSIS — K264 Chronic or unspecified duodenal ulcer with hemorrhage: Secondary | ICD-10-CM | POA: Diagnosis present

## 2015-09-30 DIAGNOSIS — R1313 Dysphagia, pharyngeal phase: Secondary | ICD-10-CM

## 2015-09-30 LAB — CULTURE, BLOOD (ROUTINE X 2)

## 2015-09-30 LAB — BASIC METABOLIC PANEL
ANION GAP: 10 (ref 5–15)
BUN: 18 mg/dL (ref 6–20)
CHLORIDE: 121 mmol/L — AB (ref 101–111)
CO2: 21 mmol/L — ABNORMAL LOW (ref 22–32)
Calcium: 7.9 mg/dL — ABNORMAL LOW (ref 8.9–10.3)
Creatinine, Ser: 1.03 mg/dL — ABNORMAL HIGH (ref 0.44–1.00)
GFR, EST NON AFRICAN AMERICAN: 58 mL/min — AB (ref >60)
Glucose, Bld: 125 mg/dL — ABNORMAL HIGH (ref 65–99)
POTASSIUM: 3.3 mmol/L — AB (ref 3.5–5.1)
SODIUM: 152 mmol/L — AB (ref 135–145)

## 2015-09-30 LAB — GLUCOSE, CAPILLARY
GLUCOSE-CAPILLARY: 106 mg/dL — AB (ref 65–99)
GLUCOSE-CAPILLARY: 116 mg/dL — AB (ref 65–99)
GLUCOSE-CAPILLARY: 98 mg/dL (ref 65–99)
GLUCOSE-CAPILLARY: 138 mg/dL — AB (ref 65–99)
Glucose-Capillary: 106 mg/dL — ABNORMAL HIGH (ref 65–99)
Glucose-Capillary: 140 mg/dL — ABNORMAL HIGH (ref 65–99)

## 2015-09-30 LAB — CBC
HEMATOCRIT: 26 % — AB (ref 36.0–46.0)
HEMOGLOBIN: 8.2 g/dL — AB (ref 12.0–15.0)
MCH: 27.2 pg (ref 26.0–34.0)
MCHC: 31.5 g/dL (ref 30.0–36.0)
MCV: 86.4 fL (ref 78.0–100.0)
Platelets: 134 10*3/uL — ABNORMAL LOW (ref 150–400)
RBC: 3.01 MIL/uL — AB (ref 3.87–5.11)
RDW: 16.6 % — ABNORMAL HIGH (ref 11.5–15.5)
WBC: 17.4 10*3/uL — AB (ref 4.0–10.5)

## 2015-09-30 MED ORDER — FUROSEMIDE 10 MG/ML IJ SOLN
40.0000 mg | Freq: Once | INTRAMUSCULAR | Status: AC
  Administered 2015-09-30: 40 mg via INTRAVENOUS
  Filled 2015-09-30: qty 4

## 2015-09-30 MED ORDER — POTASSIUM CHLORIDE 10 MEQ/100ML IV SOLN
10.0000 meq | INTRAVENOUS | Status: AC
  Administered 2015-09-30 (×5): 10 meq via INTRAVENOUS
  Filled 2015-09-30 (×5): qty 100

## 2015-09-30 NOTE — Progress Notes (Addendum)
Nutrition Follow-up  DOCUMENTATION CODES:   Obesity unspecified  INTERVENTION:    Consider nasojejunal feeding tube placement?    **Until long term nutrition care plan established and/or line holiday over, pt needs more substantial nutrition support  NUTRITION DIAGNOSIS:   Increased nutrient needs related to chronic illness as evidenced by estimated needs, ongoing  GOAL:   Patient will meet greater than or equal to 90% of their needs, currently unmet  MONITOR:   Labs, Weight trends, Skin, I & O's, TPN vs EN   ASSESSMENT:   60 y.o. Female with h/o laryngeal cancer, s/p trach, currently on TPN for nutrition. All of this occurred during a Jan 16th admission. Patient apparently has been declining in general since time of discharge but is especially more confused in past couple of days. WBC today had increased to 28k from 9 days ago. Patient sent to ED for eval.  Pt underwent tracheostomy on 09/04/2015; PEG tube could not be placed due to gastric and duodenal ulcers.  PICC line removed 2/13 for line holiday.  + enterococcal bacteremia and fungemia. Currently receiving D5W at 125 ml/hr for caloric source, however, this is providing her no protein. Pt previously receiving TPN: Clinimix E 5/15 at 83 ml/hr plus IVFE at 10 ml/hr which provided 100 gm protein and 1894 kcals. S/p MBSS 09/07/14.  Dx with moderate-severe pharyngeal dysphagia.   SLP recommending NPO status at that time. Possible PEG vs open G-tube placement for nutrition support.  Diet Order:  Diet NPO time specified  Skin:  Wound (see comment) (Stage II to neck)  Last BM:  2/16  Height:   Ht Readings from Last 1 Encounters:  09/23/15 5\' 3"  (1.6 m)    Weight:   Wt Readings from Last 1 Encounters:  09/23/15 200 lb 9.9 oz (91 kg)    Ideal Body Weight:  52.27 kg  BMI:  Body mass index is 35.55 kg/(m^2).  Estimated Nutritional Needs:   Kcal:  1700-1900  Protein:  90-105 gm  Fluid:  1.7-1.9  L  EDUCATION NEEDS:   No education needs identified at this time  Arthur Holms, RD, LDN Pager #: [phone] After-Hours Pager #: [phone]

## 2015-09-30 NOTE — Consult Note (Signed)
Referring Provider: Dr. Hartford Poli Primary Care Physician:  Arnoldo Morale, MD Primary Gastroenterologist:  Althia Forts  Reason for Consultation:  PEG   HPI: Ashley Ramsey is a 60 y.o. female who was diagnosed with laryngeal cancer during a hospitalization at [facility] from 08/29/15 to 09/12/15.  On CT neck she was found to have a laryngeal mass, CT abdomen revealed a left renal mass. ENT was consulted-patient underwent a biopsy on 1/20 confirming malignancy. Hospital course was complicated by development of Massive upperGI bleeding and associated blood loss anemia, s/p 5 units PRBC. GI was consulted, endoscopy revealed duodenal ulcer (procedure listed below); she also had Gastroduodenal artery embolizatyion by IR. She underwent tracheostomy on 09/04/2015.  Had a PICC line placed for TNA and was discharged.  Was readmitted on 2/10 for bacteremia and fungemia, was seen by ID and treated.  Has moderate-severe pharyngeal dysphagia for which GI is being consulted for PEG.  Patient and family want all aggressive measures performed.  Surgery also saw patient and recommended GI possibly "re-look" with EGD.  Patient's hgb did drift down during this hospitalization and she has required 2 units PRBC's on this admission.  Most recent BM was brown in color per nursing staff reports.  EGD 09/04/2015 by Dr. Havery Moros showed significant amount of blood clot in the gastric fundus, cardia, and proximal body which could not be cleared. Large duodenal ulcer with stigmata of bleeding with a 2 hemostasis clips placed across the area.   Past Medical History  Diagnosis Date  . Thyroid disease   . Hypertension   . Neck mass hospitalized 08/29/2015  . Type II diabetes mellitus ([place])   . Left kidney mass   . Anemia, chronic disease     Past Surgical History  Procedure Laterality Date  . No past surgeries    . Esophagogastroduodenoscopy N/A 09/04/2015    Procedure: ESOPHAGOGASTRODUODENOSCOPY (EGD);  Surgeon: Manus Gunning, MD;  Location: [place];  Service: Gastroenterology;  Laterality: N/A;  . Direct laryngoscopy N/A 09/02/2015    Procedure: DIRECT LARYNGOSCOPY WITH BIOPSY;  Surgeon: Izora Gala, MD;  Location: [place];  Service: ENT;  Laterality: N/A;  . Esophagoscopy N/A 09/02/2015    Procedure: ESOPHAGOSCOPY;  Surgeon: Izora Gala, MD;  Location: [place];  Service: ENT;  Laterality: N/A;  . Tracheostomy tube placement N/A 09/04/2015    Procedure: TRACHEOSTOMY;  Surgeon: Izora Gala, MD;  Location: [place];  Service: ENT;  Laterality: N/A;  . Direct laryngoscopy  09/04/2015    Procedure: DIRECT View LARYNGOSCOPY with cautery of of tumor;  Surgeon: Izora Gala, MD;  Location: [place];  Service: ENT;;    Prior to Admission medications   Medication Sig Start Date End Date Taking? Authorizing Provider  acetaminophen (TYLENOL) 325 MG suppository Place 1 suppository (325 mg total) rectally every 4 (four) hours as needed for mild pain. Patient taking differently: Place 325 mg rectally every 4 (four) hours as needed for mild pain (nausea).  09/12/15  Yes Thurnell Lose, MD  albuterol (PROVENTIL) (2.5 MG/3ML) 0.083% nebulizer solution Take 3 mLs (2.5 mg total) by nebulization every 6 (six) hours as needed for wheezing or shortness of breath. 09/12/15  Yes Thurnell Lose, MD  insulin aspart (NOVOLOG) 100 UNIT/ML injection Before each meal 3 times a day, 140-199 - 2 units, 200-250 - 4 units, 251-299 - 6 units,  300-349 - 8 units,  350 or above 10 units. Dispense syringes and needles as needed, Ok to switch to PEN if  approved. Substitute to any brand approved. DX DM2, Code E11.65 Patient taking differently: Inject 2-10 Units into the skin 3 (three) times daily with meals. Before each meal 3 times a day per sliding scale: CBG 140-199 - 2 units, 200-250 - 4 units, 251-299 - 6 units,  300-349 - 8 units,  350 or above 10 units. Dispense syringes and needles as needed, Ok to switch to PEN if approved. Substitute to any  brand approved. DX DM2, Code E11.65 09/12/15  Yes Thurnell Lose, MD  NONFORMULARY OR COMPOUNDED ITEM TPN with electrolytes and Fat emulsion per [place] via PICC start at 80cc/hr and adjust as needed 09/12/15  Yes Thurnell Lose, MD  Blood Glucose Monitoring Suppl (TRUE METRIX METER) DEVI 1 each by Does not apply route 3 (three) times daily before meals. 09/16/15   Arnoldo Morale, MD  glucose blood (FREESTYLE LITE) test strip For glucose testing every before meals at bedtime. Diagnosis E 11.65  Can substitute to any accepted brand 09/12/15   Thurnell Lose, MD  glucose blood (TRUE METRIX BLOOD GLUCOSE TEST) test strip Use 3 times daily before meals 09/16/15   Arnoldo Morale, MD  TRUEPLUS LANCETS 28G MISC 1 each by Does not apply route 3 (three) times daily before meals. 09/16/15   Arnoldo Morale, MD    Current Facility-Administered Medications  Medication Dose Route Frequency Provider Last Rate Last Dose  . acetaminophen (TYLENOL) suppository 650 mg  650 mg Rectal Q4H PRN Jeryl Columbia, NP   650 mg at 09/25/15 2103  . acetaminophen (TYLENOL) tablet 650 mg  650 mg Oral Q6H PRN Kaeli Quill, DO      . albuterol (PROVENTIL) (2.5 MG/3ML) 0.083% nebulizer solution 2.5 mg  2.5 mg Nebulization Q6H PRN Allona Quill, DO      . alteplase (CATHFLO ACTIVASE) injection 2 mg  2 mg Intracatheter Once Domenic Polite, MD   2 mg at 09/24/15 1629  . alteplase (CATHFLO ACTIVASE) injection 2 mg  2 mg Intracatheter Once Domenic Polite, MD   2 mg at 09/24/15 1626  . ampicillin (OMNIPEN) 2 g in sodium chloride 0.9 % 50 mL IVPB  2 g Intravenous 6 times per day Rolla Flatten, RPH   2 g at 09/30/15 1102  . dextrose 5 % solution   Intravenous Continuous Domenic Polite, MD 125 mL/hr at 09/29/15 2349    . fluconazole (DIFLUCAN) IVPB 400 mg  400 mg Intravenous Q24H Verlee Monte, MD   400 mg at 09/30/15 1103  . furosemide (LASIX) injection 40 mg  40 mg Intravenous Once Verlee Monte, MD      .  HYDROcodone-acetaminophen (NORCO/VICODIN) 5-325 MG per tablet 1 tablet  1 tablet Oral Q6H PRN Domenic Polite, MD      . HYDROmorphone (DILAUDID) injection 1 mg  1 mg Intravenous Q3H PRN Domenic Polite, MD   1 mg at 09/25/15 [id number]  . insulin aspart (novoLOG) injection 0-9 Units  0-9 Units Subcutaneous 6 times per day Eudelia Bunch, [facility]   1 Units at 09/29/15 1707  . metoprolol (LOPRESSOR) injection 5-10 mg  5-10 mg Intravenous Q4H PRN Domenic Polite, MD   5 mg at 09/26/15 0950  . pantoprazole (PROTONIX) injection 40 mg  40 mg Intravenous Q12H Domenic Polite, MD   40 mg at 09/30/15 1103  . potassium chloride 10 mEq in 100 mL IVPB  10 mEq Intravenous Q1 Hr x 5 Thuy D Dang, RPH   10 mEq at 09/30/15 1101  .  sodium chloride flush (NS) 0.9 % injection 10-40 mL  10-40 mL Intracatheter PRN Domenic Polite, MD   10 mL at 09/24/15 1756    Allergies as of 09/23/2015  . (No Known Allergies)    Family History  Problem Relation Age of Onset  . Diabetes Mother   . Hypertension Mother   . Heart disease Mother   . Diabetes Sister   . Hypertension Sister   . Cancer Daughter     Stomach    Social History   Social History  . Marital Status: Single    Spouse Name: N/A  . Number of Children: N/A  . Years of Education: N/A   Occupational History  . Not on file.   Social History Main Topics  . Smoking status: Former Smoker -- 0.00 packs/day for 34 years    Types: Cigarettes    Quit date: 07/14/2015  . Smokeless tobacco: Never Used  . Alcohol Use: No     Comment: 08/29/2015 "I drink 1/5th liquor one 1 day/week"  . Drug Use: No  . Sexual Activity: Not Currently     Comment: occassional    Other Topics Concern  . Not on file   Social History Narrative    Review of Systems: Ten point ROS is O/W negative except as mentioned in HPI.  Physical Exam: Vital signs in last 24 hours: Temp:  [97.1 F (36.2 C)-98.3 F (36.8 C)] 97.6 F (36.4 C) (02/17 0838) Pulse Rate:  [76-86] 86 (02/17  0838) Resp:  [17-25] 25 (02/17 0838) BP: (136-169)/(60-82) 155/82 mmHg (02/17 0838) SpO2:  [94 %-100 %] 100 % (02/17 0838) FiO2 (%):  [28 %] 28 % (02/17 0838) Last BM Date: 09/29/15 General:  Alert, Well-developed, pleasant and cooperative in NAD Head:  Normocephalic and atraumatic. Eyes:  Sclera clear, no icterus.  Conjunctiva pink. Ears:  Normal auditory acuity. Mouth:  No deformity or lesions.  Poor dentition. Neck:  Tracheostomy noted with moderate amount of secretions.   Lungs:  Clear throughout to auscultation.  No wheezes, crackles, or rhonchi.  Heart:  Regular rate and rhythm; no murmurs, clicks, rubs, or gallops. Abdomen:  Soft, non-distended.  BS present.  Non-tender. Rectal:  Deferred  Msk:  Symmetrical without gross deformities. Pulses:  Normal pulses noted. Extremities:  Without clubbing or edema. Neurologic:  Alert and  oriented x 4;  grossly normal neurologically. Skin:  Intact without significant lesions or rashes. Psych:  Alert and cooperative. Normal mood and affect.  Intake/Output from previous day: 02/16 0701 - 02/17 0700 In: 3750 [I.V.:3000; IV Piggyback:750] Out: 1250 [Urine:1250] Intake/Output this shift: Total I/O In: 275 [I.V.:125; IV Piggyback:150] Out: 150 [Urine:150]  Lab Results:  Recent Labs  09/29/15 0330 09/30/15 0400  WBC 19.6* 17.4*  HGB 8.1* 8.2*  HCT 25.7* 26.0*  PLT 126* 134*   BMET  Recent Labs  09/28/15 0355 09/29/15 0330 09/30/15 0400  NA 156* 156* 152*  K 3.5 3.5 3.3*  CL 123* 125* 121*  CO2 23 22 21*  GLUCOSE 139* 143* 125*  BUN 37* 28* 18  CREATININE 1.48* 1.20* 1.03*  CALCIUM 8.6* 8.1* 7.9*   IMPRESSION:  -60 year old female with concurrent metastatic laryngeal cancer and renal cell cancer   -Moderate-severe pharyngeal dysphagia:  Requesting PEG tube placement. -Massive upperGI bleed and associated blood loss anemia, s/p 5 units PRBC during recent hospitalization. GI was consulted, endoscopy revealed duodenal  ulcer; she also had Gastroduodenal artery embolization by IR.  Hgb down this admission and required  2 more units PRBC's but no concerns of overt GI bleeding at this time.  -Bacteremia and fungemia from PICC site where she was receiving TNA  PLAN: -If patient and family have strong wishes for a PEG placement then this is not unreasonable but uncertain of the longevity that this will add with her overall poor prognosis.  This being said, the safest route for placement would be via IR rather than endoscopic placement. -The patient's previous GI bleed was undoubtedly from the large duodenal ulcer seen on EGD.  Although stomach not seen well due to blood, there is no need for repeat EGD at this time. -Monitor hgb and transfuse prn. -Continue BID PPI.  ZEHR, JESSICA D.  09/30/2015, 12:16 PM  Pager number [phone]

## 2015-09-30 NOTE — Procedures (Addendum)
ATC setup replaced.

## 2015-09-30 NOTE — Hospital Discharge Follow-Up (Signed)
Patient known to [facility] and [employer]. Hospital follow-up appointment will be scheduled for patient when closer to discharge. Babette Relic, RN CM updated.  Will continue to follow patient's clinical progress closely.

## 2015-09-30 NOTE — Progress Notes (Signed)
Thank you for the consult request on this very complex case. Given her 3g hgb drop over the preceding 3 days I worry that her ulcer disease is still active. Given incomplete visualization of the stomach during her endoscopy 1/22 we feel she needs GI to see her and scope her before we consider PEG or open g-tube placement. I also see in your note that you favor another swallow evaluation; I, too, feel that that should take place before any consideration of a surgical procedure. Despite mention in your note, I don't see that that has been ordered. Please call us back once these two criteria have been satisfied if you still desire permanent gastric access.     Lisette Abu, PA-C Pager: [phone] General Trauma PA Pager: [phone]

## 2015-09-30 NOTE — Progress Notes (Addendum)
PARENTERAL NUTRITION CONSULT NOTE - FOLLOW UP  Pharmacy Consult:  TPN Indication:  Dysphagia/laryngeal + recent gastric/duodenal ulcers  Assessment: 64 YOF on TPN from PTA, currently on hold due to bacteremia and fungemia.  Awaiting blood culture clearance to replace PICC line and resume TPN.  She is receiving D5W at 125 ml/hr as caloric source and for hypernatremia that is improving.  Patient's potassium remains low despite supplementation.   Plan: - Continue to hold TPN per MD, f/u with order to resume when appropriate - KCL x 5 runs, BMET already ordered for tomorrow AM   No Known Allergies  Patient Measurements: Height: 5\' 3"  (160 cm) (Simultaneous filing. User may not have seen previous data.) Weight: 200 lb 9.9 oz (91 kg) (Simultaneous filing. User may not have seen previous data.) IBW/kg (Calculated) : 52.4   Vital Signs: Temp: 98.3 F (36.8 C) (02/17 0318) Temp Source: Oral (02/17 0318) BP: 136/78 mmHg (02/17 0318) Pulse Rate: 84 (02/17 0318) Intake/Output from previous day: 02/16 0701 - 02/17 0700 In: 3625 [I.V.:2875; IV Piggyback:750] Out: 1250 [Urine:1250]  Labs:  Recent Labs  09/29/15 0330 09/30/15 0400  WBC 19.6* 17.4*  HGB 8.1* 8.2*  HCT 25.7* 26.0*  PLT 126* 134*     Recent Labs  09/28/15 0355 09/29/15 0330 09/30/15 0400  NA 156* 156* 152*  K 3.5 3.5 3.3*  CL 123* 125* 121*  CO2 23 22 21*  GLUCOSE 139* 143* 125*  BUN 37* 28* 18  CREATININE 1.48* 1.20* 1.03*  CALCIUM 8.6* 8.1* 7.9*  MG  --  2.1  --    Estimated Creatinine Clearance: 62.9 mL/min (by C-G formula based on Cr of 1.03).    Recent Labs  09/29/15 1938 09/30/15 0003 09/30/15 0452  GLUCAP 97 106* 106*     Ashley Ramsey D. Mina Marble, PharmD, BCPS Pager:  [phone] 09/30/2015, 7:38 AM

## 2015-09-30 NOTE — Progress Notes (Signed)
[facility] PROGRESS NOTE  Ashley Ramsey [id number] DOB: 06/25/1956 DOA: 09/23/2015 PCP: Arnoldo Morale, MD   Subjective: No new complaints, appreciate general surgery taking a look at her. Gastroenterology to reevaluate.  HPI: Ashley Ramsey is a 60 year old female with a history of Type 2 DM (A1c 6.2), HTN was diagnosed with laryngeal cancer during a hospitalization at [facility] from 08/29/15 to 09/12/15. On CT neck she was found to have a laryngeal mass, CT abdomen revealed a left renal mass.  ENT was consulted-patient underwent a biopsy on 1/20 confirming malignancy. Hospital course was complicated by development of Massive upper  GI bleeding and associated blood loss anemia, s/p 5 units PRBC. GI was consulted, endoscopy revealed duodenal ulcer; she also had Gastroduodenal artery embolizatyion by IR.  She underwent tracheostomy on 09/04/2015; PEG tube could not be placed due to gastric and duodenal ulcers.  Seen by Oncology and Palliative medicine and Radiation Oncology then, Supposed to FU with ENT at [facility] now admitted with Sepsis, found to have Enterococcal and Yeast bacteremia. PICC removed, ID consutling  Assessment/Plan:  Enterococcal bacteremia and Candida albicans fungemia -likely PICC related -PICC removed 09/25/13 -Clinically improving -Continue Unasyn and fluconazole per ID recommendations -Repeat blood cultures, line holiday -2-D echocardiogram complete, no evidence of vegetation, per ID continue antibiotics and fluconazole for one more month.  Dysphagia -Patient has dysphagia, recommended to be NPO. Still has about 5 cm subglottic mass. -She was getting treatment through TPN, now PICC line discontinued for line holiday. -The PEG tube was not placed during last hospital stay because of presence of duodenal ulcer on 09/03/2013 EGD.  -Gen. surgery looked at the patient and the recommended GI to relook.  Anemia  -Acute on chronic, multifactorial  -2 weeks ago  found to have proximal stomach and duodenal ulcer, status post EGD on 09/04/2015, then required Gastroduodenal Artery coiling/embolization by IR on 09-07-15, required 5units PRBC then -Keep on PPI, Transfused 2 units this admission, had some dark stools day before and this am RN reporting brown stool -Anemia panel with showed anemia of chronic disease with iron deficiency with low TIBC and high ferritin   Invasive laryngeal SCC, suspicious lymph nodes involvement per ENT -Seen by  ENT & oncology last month, underwent laryngoscopy/ biopsy on 1/20, about 5.4 subglottic mass. -Followed by Dr.Rosen and he recommended FU with ENT Dr. Fenton Malling at [facility] for Eval for Surgery -s/p tracheostomy placement by ENT on 09/05/2015 -Seen by Oncology Dr.Gorsuch and Rad Onc, and Palliative consult completed -Pt and family wants full aggressive scope of Rx -per Dr.Gorsuch, Patient's prognosis is extremely poor, poor candidate for chemotherapy, also most likely has renal cancer.  -No PEG tube as she has stomach and duodenal ulcers now -Was on TNA via PICC line -SLP eval for PMV and continued swallow eval.  -Seen by GI Dr. Collene Mares and [facility] then, I called and d/w Dr. Benson Norway regarding J tube, he felt that if a J tube is needed then it would need to be done surgically, I doubt she would be considered for Surgical J tube in setting of Enterococcal and Yeast bacteremia at this time. -Called and d/w Dr.Gorsuch 2/13, she recommended Hospice, unfortunately pt and family want full Aggressive scope of Rx.  Left renal mass: Highly suspicious for renal cell carcinoma.  -Case d/w Dr. Ree Kida last admission, recommended outpatient follow-up with him in the office for consideration of nephrectomy in the future  DM -SSI for now  Morbid obesity:  -  nutrition via TPN now  Hypernatremia -Sodium is 152 this morning, increase the D5W, give 1 dose of Lasix to avoid fluid overload.   DVT proph:  -SCDs, due to anemia,  recent massive GI bleed  Code Status: FUll Code Family Communication: none at bedside, left message for daughter yesterday and today Disposition Plan: Keep in SDU   Objective: Filed Vitals:   09/30/15 0739 09/30/15 0838  BP: 155/67 155/82  Pulse: 82 86  Temp:  97.6 F (36.4 C)  Resp: 18 25    Intake/Output Summary (Last 24 hours) at 09/30/15 1110 Last data filed at 09/30/15 1015  Gross per 24 hour  Intake   3275 ml  Output   1100 ml  Net   2175 ml   Filed Weights   09/23/15 1606 09/23/15 2253  Weight: 92.987 kg (205 lb) 91 kg (200 lb 9.9 oz)    Exam:   General:  AAOx3, chronically ill appearing, no distress  HEENT: around trach some phlegm noted  Cardiovascular: S1S2/RRR  Respiratory: conducted upper airway sounds  Abdomen: soft, NT, BS present  Musculoskeletal: no edema c/c   Data Reviewed: Basic Metabolic Panel:  Recent Labs Lab 09/25/15 0430  09/26/15 0919 09/27/15 0229 09/27/15 1600 09/28/15 0355 09/29/15 0330 09/30/15 0400  NA 154*  < > 160* 160* 159* 156* 156* 152*  K 3.8  < > 3.9 3.4* 3.9 3.5 3.5 3.3*  CL 126*  < > 125* 127* 127* 123* 125* 121*  CO2 20*  < > 19* 20* 20* 23 22 21*  GLUCOSE 216*  < > 109* 121* 135* 139* 143* 125*  BUN 42*  < > 39* 45* 43* 37* 28* 18  CREATININE 1.12*  < > 1.63* 1.64* 1.49* 1.48* 1.20* 1.03*  CALCIUM 8.5*  < > 9.4 8.8* 8.9 8.6* 8.1* 7.9*  MG 1.8  --  2.2  --   --   --  2.1  --   PHOS 2.5  --  3.2  --   --   --   --   --   < > = values in this interval not displayed. Liver Function Tests:  Recent Labs Lab 09/23/15 1812 09/25/15 0430 09/26/15 0919  AST 31 28 49*  ALT 39 36 46  ALKPHOS 157* 144* 246*  BILITOT 0.6 0.1* 0.8  PROT 6.6 5.3* 6.7  ALBUMIN 1.8* 1.5* 2.0*   No results for input(s): LIPASE, AMYLASE in the last 168 hours. No results for input(s): AMMONIA in the last 168 hours. CBC:  Recent Labs Lab 09/23/15 1422  09/25/15 1550 09/26/15 0919 09/27/15 0229 09/29/15 0330 09/30/15 0400   WBC 28.0*  < > 17.8* 24.8* 26.0* 19.6* 17.4*  NEUTROABS 24.1*  --   --   --   --   --   --   HGB 9.4*  < > 9.5* 11.0* 8.6* 8.1* 8.2*  HCT 31.1*  < > 29.8* 33.5* 26.5* 25.7* 26.0*  MCV 88.4  < > 84.9 82.7 85.5 85.7 86.4  PLT 248  < > 146* 135* 125* 126* 134*  < > = values in this interval not displayed. Cardiac Enzymes: No results for input(s): CKTOTAL, CKMB, CKMBINDEX, TROPONINI in the last 168 hours. BNP (last 3 results) No results for input(s): BNP in the last 8760 hours.  ProBNP (last 3 results) No results for input(s): PROBNP in the last 8760 hours.  CBG:  Recent Labs Lab 09/29/15 1701 09/29/15 1938 09/30/15 0003 09/30/15 0452 09/30/15 0832  GLUCAP 126*  97 106* 106* 116*    Recent Results (from the past 240 hour(s))  Urine culture     Status: None   Collection Time: 09/23/15  6:40 PM  Result Value Ref Range Status   Specimen Description URINE, CATHETERIZED  Final   Special Requests NONE  Final   Culture 7,000 COLONIES/mL INSIGNIFICANT GROWTH  Final   Report Status 09/25/2015 FINAL  Final  Blood Culture (routine x 2)     Status: None   Collection Time: 09/23/15 11:10 PM  Result Value Ref Range Status   Specimen Description BLOOD LEFT HAND  Final   Special Requests IN PEDIATRIC BOTTLE 3CC  Final   Culture  Setup Time   Final    GRAM POSITIVE COCCI IN CHAINS PEDIATRIC BOTTLE CRITICAL RESULT CALLED TO, READ BACK BY AND VERIFIED WITH: T HOOD,RN AT 1436 09/24/15 BY L BENFIELD    Culture   Final    ENTEROCOCCUS SPECIES CANDIDA ALBICANS CRITICAL RESULT CALLED TO, READ BACK BY AND VERIFIED WITH: H MILLS,RN AT 1101 09/25/15 BY L BENFIELD    Report Status 09/28/2015 FINAL  Final   Organism ID, Bacteria ENTEROCOCCUS SPECIES  Final      Susceptibility   Enterococcus species - MIC*    AMPICILLIN <=2 SENSITIVE Sensitive     VANCOMYCIN 1 SENSITIVE Sensitive     GENTAMICIN SYNERGY SENSITIVE Sensitive     LINEZOLID 2 SENSITIVE Sensitive     * ENTEROCOCCUS SPECIES  Blood  Culture (routine x 2)     Status: None   Collection Time: 09/23/15 11:11 PM  Result Value Ref Range Status   Specimen Description BLOOD RIGHT HAND  Final   Special Requests IN PEDIATRIC BOTTLE 1CC  Final   Culture  Setup Time   Final    GRAM POSITIVE COCCI IN CHAINS PEDIATRIC BOTTLE CRITICAL RESULT CALLED TO, READ BACK BY AND VERIFIED WITH: T HOOD,RN AT 1250 09/24/15 BY L BENFIELD    Culture   Final    ENTEROCOCCUS SPECIES SUSCEPTIBILITIES PERFORMED ON PREVIOUS CULTURE WITHIN THE LAST 5 DAYS. CANDIDA ALBICANS CRITICAL RESULT CALLED TO, READ BACK BY AND VERIFIED WITH: H MILLS,RN AT 1101 09/25/15 BY L BENFIELD    Report Status 09/28/2015 FINAL  Final  Culture, respiratory (NON-Expectorated)     Status: None   Collection Time: 09/24/15 12:37 AM  Result Value Ref Range Status   Specimen Description SPUTUM  Final   Special Requests SUCTIONED  Final   Gram Stain   Final    MODERATE WBC PRESENT, PREDOMINANTLY PMN FEW SQUAMOUS EPITHELIAL CELLS PRESENT FEW GRAM POSITIVE COCCI IN PAIRS FEW YEAST FEW GRAM NEGATIVE RODS Performed at [facility]    Culture   Final    NORMAL OROPHARYNGEAL FLORA Performed at [facility]    Report Status 09/26/2015 FINAL  Final  Culture, blood (routine x 2)     Status: None   Collection Time: 09/25/15  9:14 AM  Result Value Ref Range Status   Specimen Description BLOOD LEFT ANTECUBITAL  Final   Special Requests BOTTLES DRAWN AEROBIC ONLY 6CC  Final   Culture  Setup Time   Final    YEAST AEROBIC BOTTLE ONLY CRITICAL RESULT CALLED TO, READ BACK BY AND VERIFIED WITH: A AGUIRRE 09/26/15 @ [place]  Final   Report Status 09/28/2015 FINAL  Final  Culture, blood (routine x 2)     Status: None   Collection Time: 09/25/15  9:22 AM  Result Value Ref Range Status   Specimen Description BLOOD LEFT HAND  Final   Special Requests BOTTLES DRAWN AEROBIC ONLY 5CC  Final   Culture  Setup Time   Final     YEAST AEROBIC BOTTLE ONLY CRITICAL RESULT CALLED TO, READ BACK BY AND VERIFIED WITH: A AGUIRRE 09/26/15 @ [place]  Final   Report Status 09/28/2015 FINAL  Final  Culture, blood (routine x 2)     Status: None   Collection Time: 09/26/15  9:25 AM  Result Value Ref Range Status   Specimen Description BLOOD RIGHT HAND  Final   Special Requests BOTTLES DRAWN AEROBIC ONLY 1CC  Final   Culture  Setup Time   Final    BUDDING YEAST SEEN PEDIATRIC BOTTLE CRITICAL RESULT CALLED TO, READ BACK BY AND VERIFIED WITH: A AGUIERRE,RN AT 1422 09/28/15 BY L BENFIELD    Culture CANDIDA ALBICANS  Final   Report Status 09/30/2015 FINAL  Final  Culture, blood (routine x 2)     Status: None   Collection Time: 09/26/15  9:35 AM  Result Value Ref Range Status   Specimen Description BLOOD RIGHT HAND  Final   Special Requests IN PEDIATRIC BOTTLE 1CC  Final   Culture  Setup Time   Final    YEAST AEROBIC BOTTLE ONLY CRITICAL RESULT CALLED TO, READ BACK BY AND VERIFIED WITH: A AGUIRRA 09/28/15 @ [place]  Final   Report Status 09/30/2015 FINAL  Final  Culture, blood (routine x 2)     Status: None (Preliminary result)   Collection Time: 09/28/15  3:20 PM  Result Value Ref Range Status   Specimen Description BLOOD RIGHT ARM  Final   Special Requests IN PEDIATRIC BOTTLE 3CC  Final   Culture NO GROWTH < 24 HOURS  Final   Report Status PENDING  Incomplete  Culture, blood (routine x 2)     Status: None (Preliminary result)   Collection Time: 09/28/15  3:37 PM  Result Value Ref Range Status   Specimen Description BLOOD RIGHT HAND  Final   Special Requests IN PEDIATRIC BOTTLE 3CC  Final   Culture NO GROWTH < 24 HOURS  Final   Report Status PENDING  Incomplete     Studies: No results found.  Scheduled Meds: . alteplase  2 mg Intracatheter Once  . alteplase  2 mg Intracatheter Once  . ampicillin (OMNIPEN) IV  2 g Intravenous 6 times per day   . fluconazole (DIFLUCAN) IV  400 mg Intravenous Q24H  . insulin aspart  0-9 Units Subcutaneous 6 times per day  . pantoprazole (PROTONIX) IV  40 mg Intravenous Q12H  . potassium chloride  10 mEq Intravenous Q1 Hr x 5   Continuous Infusions: . dextrose 125 mL/hr at 09/29/15 2349   Antibiotics Given (last 72 hours)    Date/Time Action Medication Dose Rate   09/27/15 1243 Given   Ampicillin-Sulbactam (UNASYN) 3 g in sodium chloride 0.9 % 100 mL IVPB 3 g 100 mL/hr   09/27/15 1738 Given   Ampicillin-Sulbactam (UNASYN) 3 g in sodium chloride 0.9 % 100 mL IVPB 3 g 100 mL/hr   09/28/15 0010 Given   Ampicillin-Sulbactam (UNASYN) 3 g in sodium chloride 0.9 % 100 mL IVPB 3 g 100 mL/hr   09/28/15 0510 Given   Ampicillin-Sulbactam (UNASYN) 3 g in sodium chloride 0.9 % 100 mL IVPB 3 g 100 mL/hr   09/28/15 1134  Given   Ampicillin-Sulbactam (UNASYN) 3 g in sodium chloride 0.9 % 100 mL IVPB 3 g 100 mL/hr   09/28/15 1529 Given   ampicillin (OMNIPEN) 2 g in sodium chloride 0.9 % 50 mL IVPB 2 g 150 mL/hr   09/28/15 1833 Given   ampicillin (OMNIPEN) 2 g in sodium chloride 0.9 % 50 mL IVPB 2 g 150 mL/hr   09/29/15 0000 Given   ampicillin (OMNIPEN) 2 g in sodium chloride 0.9 % 50 mL IVPB 2 g 150 mL/hr   09/29/15 0500 Given   ampicillin (OMNIPEN) 2 g in sodium chloride 0.9 % 50 mL IVPB 2 g 150 mL/hr   09/29/15 1207 Given   ampicillin (OMNIPEN) 2 g in sodium chloride 0.9 % 50 mL IVPB 2 g 150 mL/hr   09/29/15 1651 Given   ampicillin (OMNIPEN) 2 g in sodium chloride 0.9 % 50 mL IVPB 2 g 150 mL/hr   09/29/15 2106 Given   ampicillin (OMNIPEN) 2 g in sodium chloride 0.9 % 50 mL IVPB 2 g 150 mL/hr   09/29/15 2348 Given   ampicillin (OMNIPEN) 2 g in sodium chloride 0.9 % 50 mL IVPB 2 g 150 mL/hr   09/30/15 0555 Given   ampicillin (OMNIPEN) 2 g in sodium chloride 0.9 % 50 mL IVPB 2 g 150 mL/hr   09/30/15 0813 Given   ampicillin (OMNIPEN) 2 g in sodium chloride 0.9 % 50 mL IVPB 2 g 150 mL/hr   09/30/15 1102  Given   ampicillin (OMNIPEN) 2 g in sodium chloride 0.9 % 50 mL IVPB 2 g 150 mL/hr      Principal Problem:   Severe sepsis (HCC) Active Problems:   Type 2 diabetes mellitus without complication, without long-term current use of insulin (HCC)   AKI (acute kidney injury) ([place])   HCAP (healthcare-associated pneumonia)   DM2 (diabetes mellitus, type 2) ([place])   Pressure ulcer   Hypoxia    Time spent: 92min    Kao Conry A  [facility] Pager [phone]. If 7PM-7AM, please contact night-coverage at [email or web address], password [facility] 09/30/2015, 11:10 AM  LOS: 7 days

## 2015-09-30 NOTE — Evaluation (Signed)
[facility] Speaking Valve - Evaluation Patient Details  Name: Ashley Ramsey MRN: [id number] Date of Birth: [birth year]  Today's Date: 09/30/2015 Time: [id number] SLP Time Calculation (min) (ACUTE ONLY): 28 min  Past Medical History:  Past Medical History  Diagnosis Date  . Thyroid disease   . Hypertension   . Neck mass hospitalized 08/29/2015  . Type II diabetes mellitus ([place])   . Left kidney mass   . Anemia, chronic disease    Past Surgical History:  Past Surgical History  Procedure Laterality Date  . No past surgeries    . Esophagogastroduodenoscopy N/A 09/04/2015    Procedure: ESOPHAGOGASTRODUODENOSCOPY (EGD);  Surgeon: Manus Gunning, MD;  Location: [place];  Service: Gastroenterology;  Laterality: N/A;  . Direct laryngoscopy N/A 09/02/2015    Procedure: DIRECT LARYNGOSCOPY WITH BIOPSY;  Surgeon: Izora Gala, MD;  Location: [place];  Service: ENT;  Laterality: N/A;  . Esophagoscopy N/A 09/02/2015    Procedure: ESOPHAGOSCOPY;  Surgeon: Izora Gala, MD;  Location: [place];  Service: ENT;  Laterality: N/A;  . Tracheostomy tube placement N/A 09/04/2015    Procedure: TRACHEOSTOMY;  Surgeon: Izora Gala, MD;  Location: [place];  Service: ENT;  Laterality: N/A;  . Direct laryngoscopy  09/04/2015    Procedure: DIRECT View LARYNGOSCOPY with cautery of of tumor;  Surgeon: Izora Gala, MD;  Location: [place];  Service: ENT;;   HPI:  60 y.o. female with h/o laryngeal cancer, s/p trach, type 2 DM, who presented to ED due to confusion. Pt problem list includes: severe sepsis possibly due to LLL HCAP, infection in PICC line. Last seen by SLP 1/31 for PMSV tx. Most recent MBSS 1/26 recommended NPO status except for free water protocol under strict guidelines. Pt remained NPO at d/c last month, with nutrition via TPN. Pt reported PMSV use at home as well as PO intake.   Assessment / Plan / Recommendation Clinical Impression  Pt familiar to Speech Pathology services from admission last month. She  is able to tolerate PMSV for significantly longer periords of time with vitals remaining in normal range throughout PMSV assessment. No coughing after valve placement and no indications of back pressure with adequate exhalation through upper airway. Vocal quality remains dysphonic with low intensity and true phonation not achieved (whisper-like) with approximately 85% intelligibility in discourse. Pt intermittently able to expectorate secretions which may become a barrier if secretions continue to be thick. Recommend pt wear valve during all waking hours with intermittent supervision. SLP instructed pt how to doff valve if RN not available. Demonstrated success with trach mask removed (pt questioning why she needed trach mask; O2 sats 100% with removal of mask for 3-4 minutes).      SLP Assessment  Patient needs continued Speech Lanaguage Pathology Services    Follow Up Recommendations  24 hour supervision/assistance;Home health SLP    Frequency and Duration min 2x/week  2 weeks    PMSV Trial PMSV was placed for: 20 minutes Able to redirect subglottic air through upper airway: Yes Able to Attain Phonation: Yes (false vocal fold phonation) Voice Quality: Breathy;Low vocal intensity (whisper-like, suspect false folds) Able to Expectorate Secretions: Yes (not able to expectorate all secretions due to laryngeal mass) Level of Secretion Expectoration with PMSV: Oral;Tracheal Breath Support for Phonation: Moderately decreased Intelligibility: Intelligibility reduced Word: 75-100% accurate Phrase: 50-74% accurate Sentence: Not tested Conversation: Not tested Respirations During Trial:  (12-19) SpO2 During Trial:  (100) Pulse During Trial:  (84) Behavior: Alert;Expresses self well;Cooperative;Good eye contact;Responsive to  questions   Tracheostomy Tube       Vent Dependency  FiO2 (%): 28 %    Cuff Deflation Trial  GO Tolerated Cuff Deflation:  (N/A, cuffless trach)        Ashley Ramsey 09/30/2015, 3:56 PM  Ashley Ramsey Caroli[employer] [phone]

## 2015-10-01 ENCOUNTER — Inpatient Hospital Stay (HOSPITAL_COMMUNITY): Payer: Medicaid Other

## 2015-10-01 LAB — CBC
HCT: 27.4 % — ABNORMAL LOW (ref 36.0–46.0)
Hemoglobin: 8.5 g/dL — ABNORMAL LOW (ref 12.0–15.0)
MCH: 26.8 pg (ref 26.0–34.0)
MCHC: 31 g/dL (ref 30.0–36.0)
MCV: 86.4 fL (ref 78.0–100.0)
PLATELETS: 176 10*3/uL (ref 150–400)
RBC: 3.17 MIL/uL — ABNORMAL LOW (ref 3.87–5.11)
RDW: 16.3 % — AB (ref 11.5–15.5)
WBC: 18.6 10*3/uL — AB (ref 4.0–10.5)

## 2015-10-01 LAB — GLUCOSE, CAPILLARY
GLUCOSE-CAPILLARY: 149 mg/dL — AB (ref 65–99)
Glucose-Capillary: 134 mg/dL — ABNORMAL HIGH (ref 65–99)
Glucose-Capillary: 118 mg/dL — ABNORMAL HIGH (ref 65–99)
Glucose-Capillary: 119 mg/dL — ABNORMAL HIGH (ref 65–99)
Glucose-Capillary: 118 mg/dL — ABNORMAL HIGH (ref 65–99)
Glucose-Capillary: 106 mg/dL — ABNORMAL HIGH (ref 65–99)

## 2015-10-01 LAB — BASIC METABOLIC PANEL
Anion gap: 14 (ref 5–15)
BUN: 13 mg/dL (ref 6–20)
CALCIUM: 8.1 mg/dL — AB (ref 8.9–10.3)
CHLORIDE: 115 mmol/L — AB (ref 101–111)
CO2: 22 mmol/L (ref 22–32)
CREATININE: 1.13 mg/dL — AB (ref 0.44–1.00)
GFR calc non Af Amer: 52 mL/min — ABNORMAL LOW (ref >60)
Glucose, Bld: 136 mg/dL — ABNORMAL HIGH (ref 65–99)
Potassium: 3.1 mmol/L — ABNORMAL LOW (ref 3.5–5.1)
SODIUM: 151 mmol/L — AB (ref 135–145)

## 2015-10-01 MED ORDER — POTASSIUM CHLORIDE 2 MEQ/ML IV SOLN
INTRAVENOUS | Status: DC
  Administered 2015-10-01 – 2015-10-06 (×10): via INTRAVENOUS
  Filled 2015-10-01 (×17): qty 1000

## 2015-10-01 MED ORDER — POTASSIUM CHLORIDE 10 MEQ/100ML IV SOLN: 10.0000 meq | INTRAVENOUS | Status: DC

## 2015-10-01 NOTE — Progress Notes (Signed)
Spoke with Pilar Plate from [employer] team, who stated that patient did not meet criteria for insertion of feeding tube by team as per policy.  Patient is high risk due to laryngeal tumor and other factors.  Stated to this nurse that he would relay information to Dr. Hartford Poli for potential insertion by IR.

## 2015-10-01 NOTE — Progress Notes (Signed)
[facility] PROGRESS NOTE  Ashley Ramsey [id number] DOB: 1956-07-24 DOA: 09/23/2015 PCP: Arnoldo Morale, MD   Subjective: She feels okay, I'll transfer to regular bed. She will have MBSS today, I might need to place nasal feeding tube  HPI: Ashley Ramsey is a 59 year old female with a history of Type 2 DM (A1c 6.2), HTN was diagnosed with laryngeal cancer during a hospitalization at [facility] from 08/29/15 to 09/12/15. On CT neck she was found to have a laryngeal mass, CT abdomen revealed a left renal mass.  ENT was consulted-patient underwent a biopsy on 1/20 confirming malignancy. Hospital course was complicated by development of Massive upper  GI bleeding and associated blood loss anemia, s/p 5 units PRBC. GI was consulted, endoscopy revealed duodenal ulcer; she also had Gastroduodenal artery embolizatyion by IR.  She underwent tracheostomy on 09/04/2015; PEG tube could not be placed due to gastric and duodenal ulcers.  Seen by Oncology and Palliative medicine and Radiation Oncology then, Supposed to FU with ENT at [facility] now admitted with Sepsis, found to have Enterococcal and Yeast bacteremia. PICC removed, ID consutling  Assessment/Plan:  Enterococcal bacteremia and Candida albicans fungemia -likely PICC related -PICC removed 09/25/13 -Clinically improving -Continue Unasyn and fluconazole per ID recommendations -Repeat blood cultures, line holiday -2-D echocardiogram complete, no evidence of vegetation, per ID continue antibiotics and fluconazole for one more month.  Dysphagia -Patient has dysphagia, recommended to be NPO. Still has about 5 cm subglottic mass. -She was getting treatment through TPN, now PICC line discontinued for line holiday. -The PEG tube was not placed during last hospital stay because of presence of duodenal ulcer on 09/03/2013 EGD.  -I had no success of getting procedural services to place PEG tube on this lady. -GI initially recommended general  surgery will do it, general surgery recommended GI to relook at the patient. -GI reevaluated her on 2/17, had discussion with Dr. Loletha Carrow, he recommended interventional radiology to do it. -IR recommended to hold off PEG tube for at least 6 weeks because of recent GI bleed. -Unfortunately if PEG tube is unfeasible this time she will be back on TPN.  Anemia  -Acute on chronic, multifactorial  -2 weeks ago found to have proximal stomach and duodenal ulcer, status post EGD on 09/04/2015, then required Gastroduodenal Artery coiling/embolization by IR on 09-07-15, required 5units PRBC then -Keep on PPI, Transfused 2 units this admission, had some dark stools day before and this am RN reporting brown stool -Anemia panel with showed anemia of chronic disease with iron deficiency with low TIBC and high ferritin   Invasive laryngeal SCC, suspicious lymph nodes involvement per ENT -Seen by  ENT & oncology last month, underwent laryngoscopy/ biopsy on 1/20, about 5.4 subglottic mass. -Followed by Dr.Rosen and he recommended FU with ENT Dr. Fenton Malling at [facility] for Eval for Surgery -s/p tracheostomy placement by ENT on 09/05/2015 -Seen by Oncology Dr.Gorsuch and Rad Onc, and Palliative consult completed -Pt and family wants full aggressive scope of Rx -per Dr.Gorsuch, Patient's prognosis is extremely poor, poor candidate for chemotherapy, also most likely has renal cancer.  -No PEG tube as she has stomach and duodenal ulcers now -Was on TNA via PICC line -SLP eval for PMV and continued swallow eval.  -Seen by GI Dr. Collene Mares and [place] then, I called and d/w Dr. Benson Norway regarding J tube, he felt that if a J tube is needed then it would need to be done surgically, I doubt she would  be considered for Surgical J tube in setting of Enterococcal and Yeast bacteremia at this time. -Called and d/w Dr.Gorsuch 2/13, she recommended Hospice, unfortunately pt and family want full Aggressive scope of Rx.  Left renal  mass: Highly suspicious for renal cell carcinoma.  -Case d/w Dr. Ree Kida last admission, recommended outpatient follow-up with him in the office for consideration of nephrectomy in the future  DM -SSI for now  Morbid obesity:  -nutrition via TPN now  Hypernatremia -Sodium is 152 this morning, increase the D5W, give 1 dose of Lasix to avoid fluid overload.   DVT proph:  -SCDs, due to anemia, recent massive GI bleed  Code Status: FUll Code Family Communication: none at bedside, left message for daughter yesterday and today Disposition Plan: Keep in SDU   Objective: Filed Vitals:   10/01/15 0817 10/01/15 1000  BP: 138/81 154/96  Pulse: 84 77  Temp:    Resp: 18 21    Intake/Output Summary (Last 24 hours) at 10/01/15 1059 Last data filed at 10/01/15 0900  Gross per 24 hour  Intake   2735 ml  Output    100 ml  Net   2635 ml   Filed Weights   09/23/15 1606 09/23/15 2253  Weight: 92.987 kg (205 lb) 91 kg (200 lb 9.9 oz)    Exam:   General:  AAOx3, chronically ill appearing, no distress  HEENT: around trach some phlegm noted  Cardiovascular: S1S2/RRR  Respiratory: conducted upper airway sounds  Abdomen: soft, NT, BS present  Musculoskeletal: no edema c/c   Data Reviewed: Basic Metabolic Panel:  Recent Labs Lab 09/25/15 0430  09/26/15 0919  09/27/15 1600 09/28/15 0355 09/29/15 0330 09/30/15 0400 10/01/15 0248  NA 154*  < > 160*  < > 159* 156* 156* 152* 151*  K 3.8  < > 3.9  < > 3.9 3.5 3.5 3.3* 3.1*  CL 126*  < > 125*  < > 127* 123* 125* 121* 115*  CO2 20*  < > 19*  < > 20* 23 22 21* 22  GLUCOSE 216*  < > 109*  < > 135* 139* 143* 125* 136*  BUN 42*  < > 39*  < > 43* 37* 28* 18 13  CREATININE 1.12*  < > 1.63*  < > 1.49* 1.48* 1.20* 1.03* 1.13*  CALCIUM 8.5*  < > 9.4  < > 8.9 8.6* 8.1* 7.9* 8.1*  MG 1.8  --  2.2  --   --   --  2.1  --   --   PHOS 2.5  --  3.2  --   --   --   --   --   --   < > = values in this interval not displayed. Liver  Function Tests:  Recent Labs Lab 09/25/15 0430 09/26/15 0919  AST 28 49*  ALT 36 46  ALKPHOS 144* 246*  BILITOT 0.1* 0.8  PROT 5.3* 6.7  ALBUMIN 1.5* 2.0*   No results for input(s): LIPASE, AMYLASE in the last 168 hours. No results for input(s): AMMONIA in the last 168 hours. CBC:  Recent Labs Lab 09/26/15 0919 09/27/15 0229 09/29/15 0330 09/30/15 0400 10/01/15 0248  WBC 24.8* 26.0* 19.6* 17.4* 18.6*  HGB 11.0* 8.6* 8.1* 8.2* 8.5*  HCT 33.5* 26.5* 25.7* 26.0* 27.4*  MCV 82.7 85.5 85.7 86.4 86.4  PLT 135* 125* 126* 134* 176   Cardiac Enzymes: No results for input(s): CKTOTAL, CKMB, CKMBINDEX, TROPONINI in the last 168 hours. BNP (last 3 results)  No results for input(s): BNP in the last 8760 hours.  ProBNP (last 3 results) No results for input(s): PROBNP in the last 8760 hours.  CBG:  Recent Labs Lab 09/30/15 1655 09/30/15 2029 10/01/15 10/01/15 0407 10/01/15 0843  GLUCAP 138* 140* 149* 134* 118*    Recent Results (from the past 240 hour(s))  Urine culture     Status: None   Collection Time: 09/23/15  6:40 PM  Result Value Ref Range Status   Specimen Description URINE, CATHETERIZED  Final   Special Requests NONE  Final   Culture 7,000 COLONIES/mL INSIGNIFICANT GROWTH  Final   Report Status 09/25/2015 FINAL  Final  Blood Culture (routine x 2)     Status: None   Collection Time: 09/23/15 11:10 PM  Result Value Ref Range Status   Specimen Description BLOOD LEFT HAND  Final   Special Requests IN PEDIATRIC BOTTLE 3CC  Final   Culture  Setup Time   Final    GRAM POSITIVE COCCI IN CHAINS PEDIATRIC BOTTLE CRITICAL RESULT CALLED TO, READ BACK BY AND VERIFIED WITH: T HOOD,RN AT 1436 09/24/15 BY L BENFIELD    Culture   Final    ENTEROCOCCUS SPECIES CANDIDA ALBICANS CRITICAL RESULT CALLED TO, READ BACK BY AND VERIFIED WITH: H MILLS,RN AT 1101 09/25/15 BY L BENFIELD    Report Status 09/28/2015 FINAL  Final   Organism ID, Bacteria ENTEROCOCCUS SPECIES  Final       Susceptibility   Enterococcus species - MIC*    AMPICILLIN <=2 SENSITIVE Sensitive     VANCOMYCIN 1 SENSITIVE Sensitive     GENTAMICIN SYNERGY SENSITIVE Sensitive     LINEZOLID 2 SENSITIVE Sensitive     * ENTEROCOCCUS SPECIES  Blood Culture (routine x 2)     Status: None   Collection Time: 09/23/15 11:11 PM  Result Value Ref Range Status   Specimen Description BLOOD RIGHT HAND  Final   Special Requests IN PEDIATRIC BOTTLE 1CC  Final   Culture  Setup Time   Final    GRAM POSITIVE COCCI IN CHAINS PEDIATRIC BOTTLE CRITICAL RESULT CALLED TO, READ BACK BY AND VERIFIED WITH: T HOOD,RN AT 1250 09/24/15 BY L BENFIELD    Culture   Final    ENTEROCOCCUS SPECIES SUSCEPTIBILITIES PERFORMED ON PREVIOUS CULTURE WITHIN THE LAST 5 DAYS. CANDIDA ALBICANS CRITICAL RESULT CALLED TO, READ BACK BY AND VERIFIED WITH: H MILLS,RN AT 1101 09/25/15 BY L BENFIELD    Report Status 09/28/2015 FINAL  Final  Culture, respiratory (NON-Expectorated)     Status: None   Collection Time: 09/24/15 12:37 AM  Result Value Ref Range Status   Specimen Description SPUTUM  Final   Special Requests SUCTIONED  Final   Gram Stain   Final    MODERATE WBC PRESENT, PREDOMINANTLY PMN FEW SQUAMOUS EPITHELIAL CELLS PRESENT FEW GRAM POSITIVE COCCI IN PAIRS FEW YEAST FEW GRAM NEGATIVE RODS Performed at [facility]    Culture   Final    NORMAL OROPHARYNGEAL FLORA Performed at [facility]    Report Status 09/26/2015 FINAL  Final  Culture, blood (routine x 2)     Status: None   Collection Time: 09/25/15  9:14 AM  Result Value Ref Range Status   Specimen Description BLOOD LEFT ANTECUBITAL  Final   Special Requests BOTTLES DRAWN AEROBIC ONLY 6CC  Final   Culture  Setup Time   Final    YEAST AEROBIC BOTTLE ONLY CRITICAL RESULT CALLED TO, READ BACK BY AND VERIFIED WITH: A  AGUIRRE 09/26/15 @ [place]  Final   Report Status 09/28/2015 FINAL  Final  Culture, blood (routine x  2)     Status: None   Collection Time: 09/25/15  9:22 AM  Result Value Ref Range Status   Specimen Description BLOOD LEFT HAND  Final   Special Requests BOTTLES DRAWN AEROBIC ONLY 5CC  Final   Culture  Setup Time   Final    YEAST AEROBIC BOTTLE ONLY CRITICAL RESULT CALLED TO, READ BACK BY AND VERIFIED WITH: A AGUIRRE 09/26/15 @ [place]  Final   Report Status 09/28/2015 FINAL  Final  Culture, blood (routine x 2)     Status: None   Collection Time: 09/26/15  9:25 AM  Result Value Ref Range Status   Specimen Description BLOOD RIGHT HAND  Final   Special Requests BOTTLES DRAWN AEROBIC ONLY 1CC  Final   Culture  Setup Time   Final    BUDDING YEAST SEEN PEDIATRIC BOTTLE CRITICAL RESULT CALLED TO, READ BACK BY AND VERIFIED WITH: A AGUIERRE,RN AT 1422 09/28/15 BY L BENFIELD    Culture CANDIDA ALBICANS  Final   Report Status 09/30/2015 FINAL  Final  Culture, blood (routine x 2)     Status: None   Collection Time: 09/26/15  9:35 AM  Result Value Ref Range Status   Specimen Description BLOOD RIGHT HAND  Final   Special Requests IN PEDIATRIC BOTTLE 1CC  Final   Culture  Setup Time   Final    YEAST AEROBIC BOTTLE ONLY CRITICAL RESULT CALLED TO, READ BACK BY AND VERIFIED WITH: A AGUIRRA 09/28/15 @ [place]  Final   Report Status 09/30/2015 FINAL  Final  Culture, blood (routine x 2)     Status: None (Preliminary result)   Collection Time: 09/28/15  3:20 PM  Result Value Ref Range Status   Specimen Description BLOOD RIGHT ARM  Final   Special Requests IN PEDIATRIC BOTTLE 3CC  Final   Culture NO GROWTH 2 DAYS  Final   Report Status PENDING  Incomplete  Culture, blood (routine x 2)     Status: None (Preliminary result)   Collection Time: 09/28/15  3:37 PM  Result Value Ref Range Status   Specimen Description BLOOD RIGHT HAND  Final   Special Requests IN PEDIATRIC BOTTLE 3CC  Final   Culture NO GROWTH 2 DAYS  Final   Report  Status PENDING  Incomplete     Studies: No results found.  Scheduled Meds: . alteplase  2 mg Intracatheter Once  . alteplase  2 mg Intracatheter Once  . ampicillin (OMNIPEN) IV  2 g Intravenous 6 times per day  . fluconazole (DIFLUCAN) IV  400 mg Intravenous Q24H  . insulin aspart  0-9 Units Subcutaneous 6 times per day  . pantoprazole (PROTONIX) IV  40 mg Intravenous Q12H   Continuous Infusions: . dextrose 5 % 1,000 mL with potassium chloride 40 mEq infusion 100 mL/hr at 10/01/15 0809   Antibiotics Given (last 72 hours)    Date/Time Action Medication Dose Rate   09/28/15 1134 Given   Ampicillin-Sulbactam (UNASYN) 3 g in sodium chloride 0.9 % 100 mL IVPB 3 g 100 mL/hr   09/28/15 1529 Given   ampicillin (OMNIPEN) 2 g in sodium chloride 0.9 % 50 mL IVPB 2 g 150 mL/hr   09/28/15 1833 Given   ampicillin (OMNIPEN) 2 g  in sodium chloride 0.9 % 50 mL IVPB 2 g 150 mL/hr   09/29/15 0000 Given   ampicillin (OMNIPEN) 2 g in sodium chloride 0.9 % 50 mL IVPB 2 g 150 mL/hr   09/29/15 0500 Given   ampicillin (OMNIPEN) 2 g in sodium chloride 0.9 % 50 mL IVPB 2 g 150 mL/hr   09/29/15 1207 Given   ampicillin (OMNIPEN) 2 g in sodium chloride 0.9 % 50 mL IVPB 2 g 150 mL/hr   09/29/15 1651 Given   ampicillin (OMNIPEN) 2 g in sodium chloride 0.9 % 50 mL IVPB 2 g 150 mL/hr   09/29/15 2106 Given   ampicillin (OMNIPEN) 2 g in sodium chloride 0.9 % 50 mL IVPB 2 g 150 mL/hr   09/29/15 2348 Given   ampicillin (OMNIPEN) 2 g in sodium chloride 0.9 % 50 mL IVPB 2 g 150 mL/hr   09/30/15 0555 Given   ampicillin (OMNIPEN) 2 g in sodium chloride 0.9 % 50 mL IVPB 2 g 150 mL/hr   09/30/15 0813 Given   ampicillin (OMNIPEN) 2 g in sodium chloride 0.9 % 50 mL IVPB 2 g 150 mL/hr   09/30/15 1102 Given   ampicillin (OMNIPEN) 2 g in sodium chloride 0.9 % 50 mL IVPB 2 g 150 mL/hr   09/30/15 2001 Given   ampicillin (OMNIPEN) 2 g in sodium chloride 0.9 % 50 mL IVPB 2 g 150 mL/hr   10/01/15 0010 Given   ampicillin  (OMNIPEN) 2 g in sodium chloride 0.9 % 50 mL IVPB 2 g 150 mL/hr   10/01/15 0511 Given   ampicillin (OMNIPEN) 2 g in sodium chloride 0.9 % 50 mL IVPB 2 g 150 mL/hr   10/01/15 0809 Given   ampicillin (OMNIPEN) 2 g in sodium chloride 0.9 % 50 mL IVPB 2 g 150 mL/hr      Principal Problem:   Severe sepsis (HCC) Active Problems:   Type 2 diabetes mellitus without complication, without long-term current use of insulin (HCC)   AKI (acute kidney injury) ([place])   HCAP (healthcare-associated pneumonia)   DM2 (diabetes mellitus, type 2) ([place])   Pressure ulcer   Hypoxia   Dysphagia, pharyngeal phase   Duodenal ulcer hemorrhage    Time spent: 74min    Monea Pesantez A  [facility] Pager [phone]. If 7PM-7AM, please contact night-coverage at [email or web address], password [facility] 10/01/2015, 10:59 AM  LOS: 8 days

## 2015-10-01 NOTE — Progress Notes (Signed)
PARENTERAL NUTRITION CONSULT NOTE  and ABX consult  Pharmacy Consult for TPN Indication: Dysphagia/laryngeal and recent gastric and duod ulcers unable to have PEG acutely  No Known Allergies  Patient Measurements: Height: '5\' 3"'$  (160 cm) (Simultaneous filing. User may not have seen previous data.) Weight: 200 lb 9.9 oz (91 kg) (Simultaneous filing. User may not have seen previous data.) IBW/kg (Calculated) : 52.4 Vital Signs: Temp: 97.9 F (36.6 C) (02/18 0400) Temp Source: Oral (02/18 0400) BP: 176/80 mmHg (02/18 0400) Pulse Rate: 78 (02/18 0400) Intake/Output from previous day: 02/17 0701 - 02/18 0700 In: 3050 [I.V.:2250; IV Piggyback:800] Out: 250 [Urine:250] Intake/Output from this shift:    Labs:  Recent Labs  09/29/15 0330 09/30/15 0400 10/01/15 0248  WBC 19.6* 17.4* 18.6*  HGB 8.1* 8.2* 8.5*  HCT 25.7* 26.0* 27.4*  PLT 126* 134* 176     Recent Labs  09/29/15 0330 09/30/15 0400 10/01/15 0248  NA 156* 152* 151*  K 3.5 3.3* 3.1*  CL 125* 121* 115*  CO2 22 21* 22  GLUCOSE 143* 125* 136*  BUN 28* 18 13  CREATININE 1.20* 1.03* 1.13*  CALCIUM 8.1* 7.9* 8.1*  MG 2.1  --   --    Estimated Creatinine Clearance: 57.4 mL/min (by C-G formula based on Cr of 1.13).    Recent Labs  09/30/15 2029 10/01/15 10/01/15 0407  GLUCAP 140* 149* 134*   Medications: PTA TPN  Insulin Requirements in the past 24 hours:  4 units of SSI  Assessment: 60 yo on TPN PTA. Pharmacy consulted to dose TPN for Dysphagia/laryngeal and recent gastric and duod ulcers unable to have PEG acutely.    GI: on TPN PTA for dysphagia, unable to place PEG due to gastric and duodenal ulcers. Albumin low at 2.0, prealbumin 5.2. Currently off TPN for line holiday due to bacteremia. RD and recommending trial of nasojejunal feeding tube placement. Surgery, GI, and IR to discuss possibility. IV PPI  Endocrine:   PMH of DM2. CBGs better controlled today (90-140s). SSI, no insulin listed in home  TPN formula; on 2 Units novolg TID w meals PTA. thyroid dz  Lytes:  wnl exc Na elevated but down to 151 (could be due to TPN on hold) and K low at 3.1 despite replacement the last 3 days. Mg ok at 2.1 on 2/16  Renal: SCr elevated but down to 1.48, CrCl ~44m/min. D5 @ 1240mhr  Hepatic: Trig elevated at 271. LFTs wnl exc Alk Phos up to 246.  ID: Day #9 of abx for enterococcus bacteremia, HCAP vs tracheobronchitis. Afebrile yesterday, WBC down to 19.6.  Zosyn x 1 2/10  Vanc 2/10 >> 2/12 Cefepime 2/11 >> 2/12 Unasyn 2/12 >> 2/15 Anidulafungin 2/12 >> 2/15 Ampicillin 2/15 >> Fluconazole 2/15 >>  2/10 BCx2 > enterococcus (sensitivities pending) 2/10 Ucx > insignificant growth 2/11 flu neg 2/10 S pneum Uag > collected 2/10 HIV 2/11 sputum> yeast, GNR, CPC 2/12 BCx2 > 2/2 yeast 2/13 BCx2 > 2/2 yeast 2/15 BCx2 > sent  Nutritional Goals: per RD 09/24/15 KCal: 1700-1900 Protein: 90-105 g Fluid: 1.8-2.0 L  Current Nutrition:  NPO D5 at 12568mr  Plan:  Continue D5 at 125m77m Continue to hold TPN and IVFE due to bacteremia/fungemia F/U possible IR placement of PEG tube for enteral feeding trial  Give 10mE34m KCl x 6 runs today F/U Bmet tomorrow and Mg  NathaElenor QuinonesrmD, BCPS Clinical Pharmacist Pager [phone] 7:05 AM

## 2015-10-01 NOTE — Progress Notes (Signed)
MBSS scheduled for 1200 today.   [place], [place] [phone]

## 2015-10-01 NOTE — Progress Notes (Signed)
MD contacted re:  Order for NG placement for TF.  Reflected concern for safety of placement by nursing due to presence of laryngeal mass.  Patient was previously considered for PEG placement.  Spoke with [facility] insertion team member, who will pass information on to team leader and return call.  Please refer to MBS report.

## 2015-10-01 NOTE — Progress Notes (Signed)
MBSS complete. Full report located under chart review in imaging section.  Byran Bilotti MA, CCC-SLP [phone]   

## 2015-10-01 NOTE — Progress Notes (Signed)
  Request for percutaneous gastrostomy tube seen  Dr. Barbie Banner has reviewed chart.  Patient has recent GI bleed with embolization procedure done by Dr. Pascal Lux on 09/07/2015  Dr. Barbie Banner recommends holding off on Gastrostomy tube placement for at least 6 weeks.  Berna Gitto S Dyon Rotert PA-C 10/01/2015 10:36 AM

## 2015-10-02 LAB — GLUCOSE, CAPILLARY
GLUCOSE-CAPILLARY: 121 mg/dL — AB (ref 65–99)
GLUCOSE-CAPILLARY: 122 mg/dL — AB (ref 65–99)
GLUCOSE-CAPILLARY: 137 mg/dL — AB (ref 65–99)
GLUCOSE-CAPILLARY: 96 mg/dL (ref 65–99)
Glucose-Capillary: 121 mg/dL — ABNORMAL HIGH (ref 65–99)
Glucose-Capillary: 91 mg/dL (ref 65–99)

## 2015-10-02 NOTE — Progress Notes (Signed)
Pt transferred to 3 East bed 19 per bed with all belongings by RN & NT. Pt oriented to new room - given call light family made aware of transfer

## 2015-10-02 NOTE — Procedures (Signed)
ATC setup replaced.

## 2015-10-02 NOTE — Progress Notes (Signed)
Called report to [place] to [employer] will transfer per bed  to new room

## 2015-10-02 NOTE — Progress Notes (Signed)
[facility] PROGRESS NOTE  JOEL YORK [id number] DOB: 1956-03-21 DOA: 09/23/2015 PCP: Arnoldo Morale, MD   Subjective: Transfer to regular bed, waiting for PEG tube, meanwhile will place NG tube  HPI: Ashley Ramsey is a 60 year old female with a history of Type 2 DM (A1c 6.2), HTN was diagnosed with laryngeal cancer during a hospitalization at [facility] from 08/29/15 to 09/12/15. On CT neck she was found to have a laryngeal mass, CT abdomen revealed a left renal mass.  ENT was consulted-patient underwent a biopsy on 1/20 confirming malignancy. Hospital course was complicated by development of Massive upper  GI bleeding and associated blood loss anemia, s/p 5 units PRBC. GI was consulted, endoscopy revealed duodenal ulcer; she also had Gastroduodenal artery embolizatyion by IR.  She underwent tracheostomy on 09/04/2015; PEG tube could not be placed due to gastric and duodenal ulcers.  Seen by Oncology and Palliative medicine and Radiation Oncology then, Supposed to FU with ENT at [facility] now admitted with Sepsis, found to have Enterococcal and Yeast bacteremia. PICC removed, ID consutling  Assessment/Plan:  Enterococcal bacteremia and Candida albicans fungemia -Likely PICC related -PICC removed 09/25/13 -Clinically improving -Continue Unasyn and fluconazole per ID recommendations -Repeat blood cultures, line holiday -2-D echocardiogram complete, no evidence of vegetation, per ID continue antibiotics and fluconazole for one more month.  Dysphagia -Patient has dysphagia, recommended to be NPO. Still has about 5 cm subglottic mass. -She was getting treatment through TPN, now PICC line discontinued for line holiday. -The PEG tube was not placed during last hospital stay because of presence of duodenal ulcer on 09/03/2013 EGD.  -I had no success of getting procedural services to place PEG tube on this lady. -GI initially recommended general surgery will do it, general surgery  recommended GI to relook at the patient. -GI reevaluated her on 2/17, had discussion with Dr. Loletha Carrow, he recommended interventional radiology to do it. -IR recommended to hold off PEG tube for at least 6 weeks because of recent GI bleed. -Unfortunately if PEG tube is unfeasible this time she will be back on TPN.  Anemia  -Acute on chronic, multifactorial  -2 weeks ago found to have proximal stomach and duodenal ulcer, status post EGD on 09/04/2015, then required Gastroduodenal Artery coiling/embolization by IR on 09-07-15, required 5units PRBC then -Keep on PPI, Transfused 2 units this admission, had some dark stools day before and this am RN reporting brown stool -Anemia panel with showed anemia of chronic disease with iron deficiency with low TIBC and high ferritin   Invasive laryngeal SCC, suspicious lymph nodes involvement per ENT -Seen by  ENT & oncology last month, underwent laryngoscopy/ biopsy on 1/20, about 5.4 subglottic mass. -Followed by Dr.Rosen and he recommended FU with ENT Dr. Fenton Malling at [facility] for Eval for Surgery -s/p tracheostomy placement by ENT on 09/05/2015 -Seen by Oncology Dr.Gorsuch and Rad Onc, and Palliative consult completed -Pt and family wants full aggressive scope of Rx -per Dr.Gorsuch, Patient's prognosis is extremely poor, poor candidate for chemotherapy, also most likely has renal cancer.  -No PEG tube as she has stomach and duodenal ulcers now -Was on TNA via PICC line -SLP eval for PMV and continued swallow eval.  -Seen by GI Dr. Collene Mares and [facility] then, I called and d/w Dr. Benson Norway regarding J tube, he felt that if a J tube is needed then it would need to be done surgically, I doubt she would be considered for Surgical J tube in setting  of Enterococcal and Yeast bacteremia at this time. -Called and d/w Dr.Gorsuch 2/13, she recommended Hospice, unfortunately pt and family want full Aggressive scope of Rx.  Left renal mass: Highly suspicious for renal cell  carcinoma.  -Case d/w Dr. Ree Kida last admission, recommended outpatient follow-up with him in the office for consideration of nephrectomy in the future  DM -SSI for now  Morbid obesity:  -nutrition via TPN now  Hypernatremia -Sodium is 152 this morning, increase the D5W, give 1 dose of Lasix to avoid fluid overload.   DVT proph:  -SCDs, due to anemia, recent massive GI bleed  Code Status: FUll Code Family Communication: none at bedside, left message for daughter yesterday and today Disposition Plan: Keep in SDU   Objective: Filed Vitals:   10/02/15 0932 10/02/15 1319  BP: 155/71 153/81  Pulse: 80   Temp:  98.1 F (36.7 C)  Resp: 18 20    Intake/Output Summary (Last 24 hours) at 10/02/15 1352 Last data filed at 10/02/15 0900  Gross per 24 hour  Intake   2150 ml  Output    950 ml  Net   1200 ml   Filed Weights   09/23/15 1606 09/23/15 2253  Weight: 92.987 kg (205 lb) 91 kg (200 lb 9.9 oz)    Exam:   General:  AAOx3, chronically ill appearing, no distress  HEENT: around trach some phlegm noted  Cardiovascular: S1S2/RRR  Respiratory: conducted upper airway sounds  Abdomen: soft, NT, BS present  Musculoskeletal: no edema c/c   Data Reviewed: Basic Metabolic Panel:  Recent Labs Lab 09/26/15 0919  09/27/15 1600 09/28/15 0355 09/29/15 0330 09/30/15 0400 10/01/15 0248  NA 160*  < > 159* 156* 156* 152* 151*  K 3.9  < > 3.9 3.5 3.5 3.3* 3.1*  CL 125*  < > 127* 123* 125* 121* 115*  CO2 19*  < > 20* 23 22 21* 22  GLUCOSE 109*  < > 135* 139* 143* 125* 136*  BUN 39*  < > 43* 37* 28* 18 13  CREATININE 1.63*  < > 1.49* 1.48* 1.20* 1.03* 1.13*  CALCIUM 9.4  < > 8.9 8.6* 8.1* 7.9* 8.1*  MG 2.2  --   --   --  2.1  --   --   PHOS 3.2  --   --   --   --   --   --   < > = values in this interval not displayed. Liver Function Tests:  Recent Labs Lab 09/26/15 0919  AST 49*  ALT 46  ALKPHOS 246*  BILITOT 0.8  PROT 6.7  ALBUMIN 2.0*   No results  for input(s): LIPASE, AMYLASE in the last 168 hours. No results for input(s): AMMONIA in the last 168 hours. CBC:  Recent Labs Lab 09/26/15 0919 09/27/15 0229 09/29/15 0330 09/30/15 0400 10/01/15 0248  WBC 24.8* 26.0* 19.6* 17.4* 18.6*  HGB 11.0* 8.6* 8.1* 8.2* 8.5*  HCT 33.5* 26.5* 25.7* 26.0* 27.4*  MCV 82.7 85.5 85.7 86.4 86.4  PLT 135* 125* 126* 134* 176   Cardiac Enzymes: No results for input(s): CKTOTAL, CKMB, CKMBINDEX, TROPONINI in the last 168 hours. BNP (last 3 results) No results for input(s): BNP in the last 8760 hours.  ProBNP (last 3 results) No results for input(s): PROBNP in the last 8760 hours.  CBG:  Recent Labs Lab 10/01/15 2035 10/02/15 0039 10/02/15 0455 10/02/15 0754 10/02/15 1317  GLUCAP 106* 121* 122* 121* 96    Recent Results (from the past  240 hour(s))  Urine culture     Status: None   Collection Time: 09/23/15  6:40 PM  Result Value Ref Range Status   Specimen Description URINE, CATHETERIZED  Final   Special Requests NONE  Final   Culture 7,000 COLONIES/mL INSIGNIFICANT GROWTH  Final   Report Status 09/25/2015 FINAL  Final  Blood Culture (routine x 2)     Status: None   Collection Time: 09/23/15 11:10 PM  Result Value Ref Range Status   Specimen Description BLOOD LEFT HAND  Final   Special Requests IN PEDIATRIC BOTTLE 3CC  Final   Culture  Setup Time   Final    GRAM POSITIVE COCCI IN CHAINS PEDIATRIC BOTTLE CRITICAL RESULT CALLED TO, READ BACK BY AND VERIFIED WITH: T HOOD,RN AT 1436 09/24/15 BY L BENFIELD    Culture   Final    ENTEROCOCCUS SPECIES CANDIDA ALBICANS CRITICAL RESULT CALLED TO, READ BACK BY AND VERIFIED WITH: H MILLS,RN AT 1101 09/25/15 BY L BENFIELD    Report Status 09/28/2015 FINAL  Final   Organism ID, Bacteria ENTEROCOCCUS SPECIES  Final      Susceptibility   Enterococcus species - MIC*    AMPICILLIN <=2 SENSITIVE Sensitive     VANCOMYCIN 1 SENSITIVE Sensitive     GENTAMICIN SYNERGY SENSITIVE Sensitive      LINEZOLID 2 SENSITIVE Sensitive     * ENTEROCOCCUS SPECIES  Blood Culture (routine x 2)     Status: None   Collection Time: 09/23/15 11:11 PM  Result Value Ref Range Status   Specimen Description BLOOD RIGHT HAND  Final   Special Requests IN PEDIATRIC BOTTLE 1CC  Final   Culture  Setup Time   Final    GRAM POSITIVE COCCI IN CHAINS PEDIATRIC BOTTLE CRITICAL RESULT CALLED TO, READ BACK BY AND VERIFIED WITH: T HOOD,RN AT 1250 09/24/15 BY L BENFIELD    Culture   Final    ENTEROCOCCUS SPECIES SUSCEPTIBILITIES PERFORMED ON PREVIOUS CULTURE WITHIN THE LAST 5 DAYS. CANDIDA ALBICANS CRITICAL RESULT CALLED TO, READ BACK BY AND VERIFIED WITH: H MILLS,RN AT 1101 09/25/15 BY L BENFIELD    Report Status 09/28/2015 FINAL  Final  Culture, respiratory (NON-Expectorated)     Status: None   Collection Time: 09/24/15 12:37 AM  Result Value Ref Range Status   Specimen Description SPUTUM  Final   Special Requests SUCTIONED  Final   Gram Stain   Final    MODERATE WBC PRESENT, PREDOMINANTLY PMN FEW SQUAMOUS EPITHELIAL CELLS PRESENT FEW GRAM POSITIVE COCCI IN PAIRS FEW YEAST FEW GRAM NEGATIVE RODS Performed at [facility]    Culture   Final    NORMAL OROPHARYNGEAL FLORA Performed at [facility]    Report Status 09/26/2015 FINAL  Final  Culture, blood (routine x 2)     Status: None   Collection Time: 09/25/15  9:14 AM  Result Value Ref Range Status   Specimen Description BLOOD LEFT ANTECUBITAL  Final   Special Requests BOTTLES DRAWN AEROBIC ONLY 6CC  Final   Culture  Setup Time   Final    YEAST AEROBIC BOTTLE ONLY CRITICAL RESULT CALLED TO, READ BACK BY AND VERIFIED WITH: A AGUIRRE 09/26/15 @ [place]  Final   Report Status 09/28/2015 FINAL  Final  Culture, blood (routine x 2)     Status: None   Collection Time: 09/25/15  9:22 AM  Result Value Ref Range Status   Specimen Description BLOOD LEFT HAND  Final   Special Requests BOTTLES DRAWN  AEROBIC ONLY 5CC  Final   Culture  Setup Time   Final    YEAST AEROBIC BOTTLE ONLY CRITICAL RESULT CALLED TO, READ BACK BY AND VERIFIED WITH: A AGUIRRE 09/26/15 @ [place]  Final   Report Status 09/28/2015 FINAL  Final  Culture, blood (routine x 2)     Status: None   Collection Time: 09/26/15  9:25 AM  Result Value Ref Range Status   Specimen Description BLOOD RIGHT HAND  Final   Special Requests BOTTLES DRAWN AEROBIC ONLY 1CC  Final   Culture  Setup Time   Final    BUDDING YEAST SEEN PEDIATRIC BOTTLE CRITICAL RESULT CALLED TO, READ BACK BY AND VERIFIED WITH: A AGUIERRE,RN AT 1422 09/28/15 BY L BENFIELD    Culture CANDIDA ALBICANS  Final   Report Status 09/30/2015 FINAL  Final  Culture, blood (routine x 2)     Status: None   Collection Time: 09/26/15  9:35 AM  Result Value Ref Range Status   Specimen Description BLOOD RIGHT HAND  Final   Special Requests IN PEDIATRIC BOTTLE 1CC  Final   Culture  Setup Time   Final    YEAST AEROBIC BOTTLE ONLY CRITICAL RESULT CALLED TO, READ BACK BY AND VERIFIED WITH: A AGUIRRA 09/28/15 @ [place]  Final   Report Status 09/30/2015 FINAL  Final  Culture, blood (routine x 2)     Status: None (Preliminary result)   Collection Time: 09/28/15  3:20 PM  Result Value Ref Range Status   Specimen Description BLOOD RIGHT ARM  Final   Special Requests IN PEDIATRIC BOTTLE 3CC  Final   Culture NO GROWTH 3 DAYS  Final   Report Status PENDING  Incomplete  Culture, blood (routine x 2)     Status: None (Preliminary result)   Collection Time: 09/28/15  3:37 PM  Result Value Ref Range Status   Specimen Description BLOOD RIGHT HAND  Final   Special Requests IN PEDIATRIC BOTTLE 3CC  Final   Culture NO GROWTH 3 DAYS  Final   Report Status PENDING  Incomplete     Studies: Dg Swallowing Func-speech Pathology  10/01/2015  Objective Swallowing Evaluation: Type of Study: MBS-Modified Barium Swallow  Study Patient Details Name: Ashley Ramsey MRN: [id number] Date of Birth: 1955-11-28 Today's Date: 10/01/2015 Time: SLP Start Time (ACUTE ONLY): 1021-SLP Stop Time (ACUTE ONLY): 1050 SLP Time Calculation (min) (ACUTE ONLY): 29 min Past Medical History: Past Medical History Diagnosis Date . Thyroid disease  . Hypertension  . Neck mass hospitalized 08/29/2015 . Type II diabetes mellitus ([place])  . Left kidney mass  . Anemia, chronic disease  Past Surgical History: Past Surgical History Procedure Laterality Date . No past surgeries   . Esophagogastroduodenoscopy N/A 09/04/2015   Procedure: ESOPHAGOGASTRODUODENOSCOPY (EGD);  Surgeon: Manus Gunning, MD;  Location: [place];  Service: Gastroenterology;  Laterality: N/A; . Direct laryngoscopy N/A 09/02/2015   Procedure: DIRECT LARYNGOSCOPY WITH BIOPSY;  Surgeon: Izora Gala, MD;  Location: [place];  Service: ENT;  Laterality: N/A; . Esophagoscopy N/A 09/02/2015   Procedure: ESOPHAGOSCOPY;  Surgeon: Izora Gala, MD;  Location: [place];  Service: ENT;  Laterality: N/A; . Tracheostomy tube placement N/A 09/04/2015   Procedure: TRACHEOSTOMY;  Surgeon: Izora Gala, MD;  Location: [place];  Service: ENT;  Laterality: N/A; . Direct laryngoscopy  09/04/2015   Procedure: DIRECT View LARYNGOSCOPY  with cautery of of tumor;  Surgeon: Izora Gala, MD;  Location: [facility] OR;  Service: ENT;; HPI: 60 y.o. female with h/o laryngeal cancer, s/p trach, type 2 DM, who presented to ED due to confusion. Pt problem list includes: severe sepsis possibly due to LLL HCAP, infection in PICC line. Last seen by SLP 1/31 for PMSV tx. Most recent MBSS 1/26 recommended NPO status except for free water protocol under strict guidelines. Pt remained NPO at d/c last month, with nutrition via TPN. Pt reported PMSV use at home as well as PO intake. Subjective: pt alert, eager for food/drink Assessment / Plan / Recommendation CHL IP CLINICAL IMPRESSIONS 10/01/2015 Therapy Diagnosis Moderate pharyngeal phase  dysphagia;Severe pharyngeal phase dysphagia Clinical Impression MBS complete. Results generally consistent with most recent MBS results 09/08/15 with potentially slightly greater dysfunction. She continues to exhibit a moderate-severe anatomically based pharyngeal dysphagia resulting in sensory and motor deficits. Primary deficit appears to be decreased laryngeal closure, likely related to laryngeal mass,  and pharyngeal residuals resulting in poor airway protection both during and post swallow with all consistencies assessed. Cough present but weak and unsuccessful to clear the airway further increasing risk of an aspiration related infection. Rehabilitation of swallow function prior to intervention for tumor guarded due to current size/location. Long term non-oral means of nutrition appropriate at this time. Recommend NPO except for free water protocol under strict guidelines. Pending plan for intervention for tumor, would recommend continued SLP services for maintenance of musculature and potential for a po diet in the future.  Impact on safety and function Severe aspiration risk;Risk for inadequate nutrition/hydration   CHL IP TREATMENT RECOMMENDATION 09/08/2015 Treatment Recommendations Therapy as outlined in treatment plan below   Prognosis 10/01/2015 Prognosis for Safe Diet Advancement Guarded Barriers to Reach Goals Severity of deficits Barriers/Prognosis Comment -- CHL IP DIET RECOMMENDATION 10/01/2015 SLP Diet Recommendations NPO;Ice chips PRN after oral care;Free water protocol after oral care;Alternative means - long-term Liquid Administration via -- Medication Administration Via alternative means Compensations -- Postural Changes --   CHL IP OTHER RECOMMENDATIONS 10/01/2015 Recommended Consults -- Oral Care Recommendations Oral care QID Other Recommendations --   CHL IP FOLLOW UP RECOMMENDATIONS 10/01/2015 Follow up Recommendations Outpatient SLP   CHL IP FREQUENCY AND DURATION 10/01/2015 Speech Therapy  Frequency (ACUTE ONLY) min 2x/week Treatment Duration 2 weeks      CHL IP ORAL PHASE 10/01/2015 Oral Phase WFL Oral - Pudding Teaspoon -- Oral - Pudding Cup -- Oral - Honey Teaspoon -- Oral - Honey Cup -- Oral - Nectar Teaspoon -- Oral - Nectar Cup -- Oral - Nectar Straw -- Oral - Thin Teaspoon -- Oral - Thin Cup -- Oral - Thin Straw -- Oral - Puree -- Oral - Mech Soft -- Oral - Regular -- Oral - Multi-Consistency -- Oral - Pill -- Oral Phase - Comment --  CHL IP PHARYNGEAL PHASE 10/01/2015 Pharyngeal Phase Impaired Pharyngeal- Pudding Teaspoon NT Pharyngeal -- Pharyngeal- Pudding Cup -- Pharyngeal -- Pharyngeal- Honey Teaspoon NT Pharyngeal -- Pharyngeal- Honey Cup NT Pharyngeal -- Pharyngeal- Nectar Teaspoon Reduced airway/laryngeal closure;Reduced epiglottic inversion;Penetration/Aspiration during swallow;Penetration/Apiration after swallow;Pharyngeal residue - valleculae;Pharyngeal residue - pyriform Pharyngeal Material enters airway, passes BELOW cords and not ejected out despite cough attempt by patient Pharyngeal- Nectar Cup -- Pharyngeal -- Pharyngeal- Nectar Straw -- Pharyngeal -- Pharyngeal- Thin Teaspoon Reduced airway/laryngeal closure;Reduced epiglottic inversion;Penetration/Aspiration during swallow;Penetration/Apiration after swallow;Pharyngeal residue - valleculae;Pharyngeal residue - pyriform Pharyngeal Material enters airway, passes BELOW cords and not ejected out despite cough attempt  by patient Pharyngeal- Thin Cup -- Pharyngeal -- Pharyngeal- Thin Straw NT Pharyngeal -- Pharyngeal- Puree Reduced airway/laryngeal closure;Reduced epiglottic inversion;Penetration/Apiration after swallow;Pharyngeal residue - valleculae;Pharyngeal residue - pyriform Pharyngeal -- Pharyngeal- Mechanical Soft -- Pharyngeal -- Pharyngeal- Regular -- Pharyngeal -- Pharyngeal- Multi-consistency -- Pharyngeal -- Pharyngeal- Pill -- Pharyngeal -- Pharyngeal Comment --  CHL IP CERVICAL ESOPHAGEAL PHASE 10/01/2015 Cervical  Esophageal Phase (No Data) Pudding Teaspoon -- Pudding Cup -- Honey Teaspoon -- Honey Cup -- Nectar Teaspoon -- Nectar Cup -- Nectar Straw -- Thin Teaspoon -- Thin Cup -- Thin Straw -- Puree -- Mechanical Soft -- Regular -- Multi-consistency -- Pill -- Cervical Esophageal Comment -- No flowsheet data found. Gabriel Rainwater MA, CCC-SLP [phone] [place] Leah Meryl 10/01/2015, 11:44 AM               Scheduled Meds: . alteplase  2 mg Intracatheter Once  . alteplase  2 mg Intracatheter Once  . ampicillin (OMNIPEN) IV  2 g Intravenous 6 times per day  . fluconazole (DIFLUCAN) IV  400 mg Intravenous Q24H  . insulin aspart  0-9 Units Subcutaneous 6 times per day  . pantoprazole (PROTONIX) IV  40 mg Intravenous Q12H   Continuous Infusions: . dextrose 5 % 1,000 mL with potassium chloride 40 mEq infusion 100 mL/hr at 10/02/15 0041   Antibiotics Given (last 72 hours)    Date/Time Action Medication Dose Rate   09/29/15 1651 Given   ampicillin (OMNIPEN) 2 g in sodium chloride 0.9 % 50 mL IVPB 2 g 150 mL/hr   09/29/15 2106 Given   ampicillin (OMNIPEN) 2 g in sodium chloride 0.9 % 50 mL IVPB 2 g 150 mL/hr   09/29/15 2348 Given   ampicillin (OMNIPEN) 2 g in sodium chloride 0.9 % 50 mL IVPB 2 g 150 mL/hr   09/30/15 0555 Given   ampicillin (OMNIPEN) 2 g in sodium chloride 0.9 % 50 mL IVPB 2 g 150 mL/hr   09/30/15 0813 Given   ampicillin (OMNIPEN) 2 g in sodium chloride 0.9 % 50 mL IVPB 2 g 150 mL/hr   09/30/15 1102 Given   ampicillin (OMNIPEN) 2 g in sodium chloride 0.9 % 50 mL IVPB 2 g 150 mL/hr   09/30/15 2001 Given   ampicillin (OMNIPEN) 2 g in sodium chloride 0.9 % 50 mL IVPB 2 g 150 mL/hr   10/01/15 0010 Given   ampicillin (OMNIPEN) 2 g in sodium chloride 0.9 % 50 mL IVPB 2 g 150 mL/hr   10/01/15 0511 Given   ampicillin (OMNIPEN) 2 g in sodium chloride 0.9 % 50 mL IVPB 2 g 150 mL/hr   10/01/15 0809 Given   ampicillin (OMNIPEN) 2 g in sodium chloride 0.9 % 50 mL IVPB 2 g 150 mL/hr   10/01/15 1300  Given   ampicillin (OMNIPEN) 2 g in sodium chloride 0.9 % 50 mL IVPB 2 g 150 mL/hr   10/01/15 2033 Given   ampicillin (OMNIPEN) 2 g in sodium chloride 0.9 % 50 mL IVPB 2 g 150 mL/hr   10/02/15 0201 Given   ampicillin (OMNIPEN) 2 g in sodium chloride 0.9 % 50 mL IVPB 2 g 150 mL/hr   10/02/15 0451 Given   ampicillin (OMNIPEN) 2 g in sodium chloride 0.9 % 50 mL IVPB 2 g 150 mL/hr   10/02/15 0849 Given   ampicillin (OMNIPEN) 2 g in sodium chloride 0.9 % 50 mL IVPB 2 g 150 mL/hr   10/02/15 1137 Given   ampicillin (OMNIPEN) 2 g in sodium chloride  0.9 % 50 mL IVPB 2 g 150 mL/hr      Principal Problem:   Severe sepsis (HCC) Active Problems:   Type 2 diabetes mellitus without complication, without long-term current use of insulin (HCC)   AKI (acute kidney injury) ([place])   HCAP (healthcare-associated pneumonia)   DM2 (diabetes mellitus, type 2) (HCC)   Pressure ulcer   Hypoxia   Dysphagia, pharyngeal phase   Duodenal ulcer hemorrhage    Time spent: 24min    Hadia Minier A  [facility] Pager [phone]. If 7PM-7AM, please contact night-coverage at [email or web address], password [facility] 10/02/2015, 1:52 PM  LOS: 9 days

## 2015-10-02 NOTE — Progress Notes (Signed)
Order placed by DR Hartford Poli for IR to evaluate pt for feeding tube - Called IR - they do not do that [facility] radiology & placed correct order for Feeding tube placement by Radiologist under fluoroscopy. Pt has a laryngeal mass, Recent Hx of duodenal ulcers that warrants a few weeks rest prior to any attempt for PEG tube. Pt also had blood Infection & PICC line had to be removed. Called radiology - said that pt will be evaluated on Monday 2/20 for feeding tube placement. Procedure like this is done only under emergent circumstances on Sundays.  Dr Hartford Poli notified via text page of updates.

## 2015-10-02 NOTE — Progress Notes (Signed)
PARENTERAL NUTRITION CONSULT NOTE  and ABX consult  Pharmacy Consult for TPN Indication: Dysphagia/laryngeal and recent gastric and duod ulcers unable to have PEG acutely  No Known Allergies  Patient Measurements: Height: '5\' 3"'$  (160 cm) (Simultaneous filing. User may not have seen previous data.) Weight: 200 lb 9.9 oz (91 kg) (Simultaneous filing. User may not have seen previous data.) IBW/kg (Calculated) : 52.4 Vital Signs: Temp: 98.2 F (36.8 C) (02/19 0308) Temp Source: Oral (02/19 0308) BP: 142/63 mmHg (02/19 0308) Pulse Rate: 70 (02/19 0308) Intake/Output from previous day: 02/18 0701 - 02/19 0700 In: 2535 [I.V.:2085; IV Piggyback:450] Out: 650 [Urine:650] Intake/Output from this shift:    Labs:  Recent Labs  09/30/15 0400 10/01/15 0248  WBC 17.4* 18.6*  HGB 8.2* 8.5*  HCT 26.0* 27.4*  PLT 134* 176     Recent Labs  09/30/15 0400 10/01/15 0248  NA 152* 151*  K 3.3* 3.1*  CL 121* 115*  CO2 21* 22  GLUCOSE 125* 136*  BUN 18 13  CREATININE 1.03* 1.13*  CALCIUM 7.9* 8.1*   Estimated Creatinine Clearance: 57.4 mL/min (by C-G formula based on Cr of 1.13).    Recent Labs  10/01/15 2035 10/02/15 0039 10/02/15 0455  GLUCAP 106* 121* 122*   Medications: PTA TPN  Insulin Requirements in the past 24 hours:  2 units of SSI  Assessment: 60 yo on TPN PTA. Pharmacy consulted to dose TPN for Dysphagia/laryngeal and recent gastric and duod ulcers unable to have PEG acutely.    GI: on TPN PTA for dysphagia, unable to place PEG due to gastric and duodenal ulcers. Albumin low at 2.0, prealbumin 5.2. Currently off TPN for line holiday due to bacteremia. RD and recommending trial of nasojejunal feeding tube placement. Surgery, GI, and IR all discussed and it seems like the recommendation is to hold off on PEG tube for at least 6 weeks due to recent GI bleed so would have to restart TPN for now. IV PPI  Endocrine:   PMH of DM2. CBGs better controlled today  (100-120s). SSI, no insulin listed in home TPN formula; on 2 Units novolg TID w meals PTA. thyroid dz  Lytes:  wnl exc Na elevated but down to 151 (could be due to TPN on hold) and K low at 3.1 (replacement ordered but d/c'd per MD). Mg ok at 2.1 on 2/16  Renal: SCr elevated but down to 1.48, CrCl ~38m/min. D5 @ 1020mhr  Hepatic: Trig elevated at 271. LFTs wnl exc Alk Phos up to 246.  ID: Day #10 of abx for enterococcus bacteremia / fungemia. Last blood cx's have been ngtd for 72 hrs. Afebrile yesterday, WBC down to 19.6.  Zosyn x 1 2/10  Vanc 2/10 >> 2/12 Cefepime 2/11 >> 2/12 Unasyn 2/12 >> 2/15 Anidulafungin 2/12 >> 2/15 Ampicillin 2/15 >> Fluconazole 2/15 >>  2/10 BCx2 > enterococcus (sensitivities pending) 2/10 Ucx > insignificant growth 2/11 flu neg 2/10 S pneum Uag > collected 2/10 HIV 2/11 sputum> yeast, GNR, CPC 2/12 BCx2 > 2/2 yeast 2/13 BCx2 > 2/2 yeast 2/15 BCx2 > ngtd  Nutritional Goals: per RD 09/24/15 KCal: 1700-1900 Protein: 90-105 g Fluid: 1.8-2.0 L  Current Nutrition:  NPO D5 at 10077mr  Plan:  Continue D5 w/ 1m50mf KCl at 100ml17mContinue to hold TPN and IVFE due to bacteremia/fungemia 2/15 Blood cx's still ngtd Plan to assess for feeding tube on 2/20  NathaElenor QuinonesrmD, BCPS Clinical Pharmacist Pager [phone] 7:08 AM

## 2015-10-03 ENCOUNTER — Inpatient Hospital Stay (HOSPITAL_COMMUNITY): Payer: Medicaid Other

## 2015-10-03 ENCOUNTER — Ambulatory Visit: Payer: Self-pay | Admitting: Family Medicine

## 2015-10-03 LAB — BASIC METABOLIC PANEL
ANION GAP: 10 (ref 5–15)
BUN: 5 mg/dL — ABNORMAL LOW (ref 6–20)
CHLORIDE: 114 mmol/L — AB (ref 101–111)
CO2: 24 mmol/L (ref 22–32)
Calcium: 8 mg/dL — ABNORMAL LOW (ref 8.9–10.3)
Creatinine, Ser: 1.06 mg/dL — ABNORMAL HIGH (ref 0.44–1.00)
GFR calc non Af Amer: 56 mL/min — ABNORMAL LOW (ref >60)
Glucose, Bld: 119 mg/dL — ABNORMAL HIGH (ref 65–99)
POTASSIUM: 3.7 mmol/L (ref 3.5–5.1)
SODIUM: 148 mmol/L — AB (ref 135–145)

## 2015-10-03 LAB — CBC
HCT: 28.2 % — ABNORMAL LOW (ref 36.0–46.0)
Hemoglobin: 9 g/dL — ABNORMAL LOW (ref 12.0–15.0)
MCH: 28 pg (ref 26.0–34.0)
MCHC: 31.9 g/dL (ref 30.0–36.0)
MCV: 87.6 fL (ref 78.0–100.0)
PLATELETS: 219 10*3/uL (ref 150–400)
RBC: 3.22 MIL/uL — AB (ref 3.87–5.11)
RDW: 16.7 % — AB (ref 11.5–15.5)
WBC: 19.9 10*3/uL — AB (ref 4.0–10.5)

## 2015-10-03 LAB — GLUCOSE, CAPILLARY
GLUCOSE-CAPILLARY: 103 mg/dL — AB (ref 65–99)
GLUCOSE-CAPILLARY: 110 mg/dL — AB (ref 65–99)
GLUCOSE-CAPILLARY: 114 mg/dL — AB (ref 65–99)
GLUCOSE-CAPILLARY: 133 mg/dL — AB (ref 65–99)
Glucose-Capillary: 115 mg/dL — ABNORMAL HIGH (ref 65–99)
Glucose-Capillary: 94 mg/dL (ref 65–99)

## 2015-10-03 LAB — CULTURE, BLOOD (ROUTINE X 2)
CULTURE: NO GROWTH
CULTURE: NO GROWTH

## 2015-10-03 MED ORDER — IOHEXOL 300 MG/ML  SOLN
50.0000 mL | Freq: Once | INTRAMUSCULAR | Status: AC | PRN
  Administered 2015-10-03: 30 mL

## 2015-10-03 MED ORDER — JEVITY 1.2 CAL PO LIQD: 1000.0000 mL | ORAL | Status: DC

## 2015-10-03 MED ORDER — LIDOCAINE VISCOUS 2 % MT SOLN
OROMUCOSAL | Status: AC
  Administered 2015-10-03: 5 mL via NASAL
  Filled 2015-10-03: qty 15

## 2015-10-03 MED ORDER — VITAL 1.5 CAL PO LIQD
1000.0000 mL | ORAL | Status: DC
  Administered 2015-10-03 – 2015-10-06 (×5): 1000 mL
  Filled 2015-10-03 (×13): qty 1000

## 2015-10-03 NOTE — Progress Notes (Signed)
Nutrition Follow-up  DOCUMENTATION CODES:   Obesity unspecified  INTERVENTION:   Recommend monitoring magnesium, potassium, and phosphorus daily for at least 3 days, MD to replete as needed, as pt is at risk for refeeding syndrome given NPO status >7 days.  Initiate Vital 1.5 @ 10 ml/hr via NGT and increase by 10 ml every 8 hours to goal rate of 55 ml/hr.   Tube feeding regimen provides 1980 kcal (100% of needs), 89 grams of protein, and 1003 ml of H2O.   When IV fluids are discontinued, add 100 ml free water flushes every 3 hours to provide an additional 800 ml daily.    NUTRITION DIAGNOSIS:   Inadequate oral intake related to inability to eat as evidenced by NPO status.  Ongoing  GOAL:   Patient will meet greater than or equal to 90% of their needs  Unmet  MONITOR:   Labs, Weight trends, Skin, I & O's  REASON FOR ASSESSMENT:   Consult Enteral/tube feeding initiation and management  ASSESSMENT:   60 y.o. Female with h/o laryngeal cancer, s/p trach, currently on TPN for nutrition. All of this occurred during a Jan 16th admission. Patient apparently has been declining in general since time of discharge but is especially more confused in past couple of days. WBC today had increased to 28k from 9 days ago. Patient sent to ED for eval.  RD consulted for TF initiation and management. Pt has NG tube in place which is near the ligament of treitz. TPN has been on hold due to bacteremia/fungemia. Pt states that she used to weigh 300 lbs. Per weight history, pt has lost from 250 lbs to 193 lbs in the past 5 weeks (23% wt loss). She is currently receiving 5% dextrose with potassium chloride @100  ml/hr.   Labs: glucose ranging 103 to 137 mg/dL, low hemoglobin, low calcium  Diet Order:  Diet NPO time specified  Skin:  Wound (see comment) (stage II pressure ulcer on neck)  Last BM:  2/20  Height:   Ht Readings from Last 1 Encounters:  09/23/15 5\' 3"  (1.6 m)    Weight:    Wt Readings from Last 1 Encounters:  10/03/15 193 lb (87.544 kg)    Ideal Body Weight:  52.27 kg  BMI:  Body mass index is 34.2 kg/(m^2).  Estimated Nutritional Needs:   Kcal:  1900-2100  Protein:  85-100 grams   Fluid:  1.9-2 L/day  EDUCATION NEEDS:   No education needs identified at this time  [place], LDN Inpatient Clinical Dietitian Pager: [phone] After Hours Pager: [phone]

## 2015-10-03 NOTE — Progress Notes (Signed)
[facility] PROGRESS NOTE  [name] [id number] DOB: [birth year] DOA: 09/23/2015 PCP: Arnoldo Morale, MD   Subjective: NG tube placed earlier today by interventional radiology. Start tube feeds, place PICC line again, it's been a week since last culture was positive for yeast on 2/13.  HPI: Ashley Ramsey is a 60 year old female with a history of Type 2 DM (A1c 6.2), HTN was diagnosed with laryngeal cancer during a hospitalization at [facility] from 08/29/15 to 09/12/15. On CT neck she was found to have a laryngeal mass, CT abdomen revealed a left renal mass.  ENT was consulted-patient underwent a biopsy on 1/20 confirming malignancy. Hospital course was complicated by development of Massive upper  GI bleeding and associated blood loss anemia, s/p 5 units PRBC. GI was consulted, endoscopy revealed duodenal ulcer; she also had Gastroduodenal artery embolizatyion by IR.  She underwent tracheostomy on 09/04/2015; PEG tube could not be placed due to gastric and duodenal ulcers.  Seen by Oncology and Palliative medicine and Radiation Oncology then, Supposed to FU with ENT at [facility] now admitted with Sepsis, found to have Enterococcal and Yeast bacteremia. PICC removed, ID consutling  Assessment/Plan:  Enterococcal bacteremia and Candida albicans fungemia -Likely PICC related -PICC removed 09/25/13 -Clinically improving -Continue Unasyn and fluconazole per ID recommendations -Repeat blood cultures, line holiday -2-D echocardiogram complete, no evidence of vegetation, per ID continue antibiotics and fluconazole for one more month.  Dysphagia -Patient has dysphagia, recommended to be NPO. Still has about 5 cm subglottic mass. -She was getting treatment through TPN, now PICC line discontinued for line holiday. -The PEG tube was not placed during last hospital stay because of presence of duodenal ulcer on 09/03/2013 EGD.  -I had no success of getting procedural services to place PEG  tube on this lady. -GI initially recommended general surgery will do it, general surgery recommended GI to relook at the patient. -GI reevaluated her on 2/17, had discussion with Dr. Loletha Carrow, he recommended interventional radiology to do it. -IR recommended to hold off PEG tube for at least 6 weeks because of recent GI bleed. -Unfortunately if PEG tube is unfeasible this time she will be back on TPN.  Anemia  -Acute on chronic, multifactorial  -2 weeks ago found to have proximal stomach and duodenal ulcer, status post EGD on 09/04/2015, then required Gastroduodenal Artery coiling/embolization by IR on 09-07-15, required 5units PRBC then -Keep on PPI, Transfused 2 units this admission, had some dark stools day before and this am RN reporting brown stool  Invasive laryngeal SCC, suspicious lymph nodes involvement per ENT -Seen by  ENT & oncology last month, underwent laryngoscopy/ biopsy on 1/20, about 5.4 subglottic mass. -Followed by Dr.Rosen and he recommended FU with ENT Dr. Fenton Malling at [facility] for Eval for Surgery -s/p tracheostomy placement by ENT on 09/05/2015 -Seen by Oncology Dr.Gorsuch and Rad Onc, and Palliative consult completed -Pt and family wants full aggressive scope of Rx -per Dr.Gorsuch, Patient's prognosis is extremely poor, poor candidate for chemotherapy, also most likely has renal cancer.  -No PEG tube as she has stomach and duodenal ulcers now -Was on TNA via PICC line -SLP eval for PMV and continued swallow eval.  -Seen by GI Dr. Collene Mares and [place] then, I called and d/w Dr. Benson [place] regarding J tube, he felt that if a J tube is needed then it would need to be done surgically, I doubt she would be considered for Surgical J tube in setting of Enterococcal and  Yeast bacteremia at this time. -Called and d/w Dr.Gorsuch 2/13, she recommended Hospice, unfortunately pt and family want full Aggressive scope of Rx.  Left renal mass: Highly suspicious for renal cell carcinoma.  -Case  d/w Dr. Ree Kida last admission, recommended outpatient follow-up with him in the office for consideration of nephrectomy in the future  DM -SSI for now  Morbid obesity:  -nutrition via TPN now  Hypernatremia -Sodium is 152 this morning, increase the D5W, give 1 dose of Lasix to avoid fluid overload.   DVT proph:  -SCDs, due to anemia, recent massive GI bleed  Code Status: FUll Code Family Communication: none at bedside, left message for daughter yesterday and today Disposition Plan: Keep in SDU   Objective: Filed Vitals:   10/03/15 1121 10/03/15 1148  BP:  161/71  Pulse: 89 80  Temp:  98 F (36.7 C)  Resp: 18 18    Intake/Output Summary (Last 24 hours) at 10/03/15 1421 Last data filed at 10/03/15 1305  Gross per 24 hour  Intake   1350 ml  Output   1600 ml  Net   -250 ml   Filed Weights   09/23/15 1606 09/23/15 2253 10/03/15 0430  Weight: 92.987 kg (205 lb) 91 kg (200 lb 9.9 oz) 87.544 kg (193 lb)    Exam:   General:  AAOx3, chronically ill appearing, no distress  HEENT: around trach some phlegm noted  Cardiovascular: S1S2/RRR  Respiratory: conducted upper airway sounds  Abdomen: soft, NT, BS present  Musculoskeletal: no edema c/c   Data Reviewed: Basic Metabolic Panel:  Recent Labs Lab 09/28/15 0355 09/29/15 0330 09/30/15 0400 10/01/15 0248 10/03/15 0609  NA 156* 156* 152* 151* 148*  K 3.5 3.5 3.3* 3.1* 3.7  CL 123* 125* 121* 115* 114*  CO2 23 22 21* 22 24  GLUCOSE 139* 143* 125* 136* 119*  BUN 37* 28* 18 13 5*  CREATININE 1.48* 1.20* 1.03* 1.13* 1.06*  CALCIUM 8.6* 8.1* 7.9* 8.1* 8.0*  MG  --  2.1  --   --   --    Liver Function Tests: No results for input(s): AST, ALT, ALKPHOS, BILITOT, PROT, ALBUMIN in the last 168 hours. No results for input(s): LIPASE, AMYLASE in the last 168 hours. No results for input(s): AMMONIA in the last 168 hours. CBC:  Recent Labs Lab 09/27/15 0229 09/29/15 0330 09/30/15 0400 10/01/15 0248  10/03/15 1125  WBC 26.0* 19.6* 17.4* 18.6* 19.9*  HGB 8.6* 8.1* 8.2* 8.5* 9.0*  HCT 26.5* 25.7* 26.0* 27.4* 28.2*  MCV 85.5 85.7 86.4 86.4 87.6  PLT 125* 126* 134* 176 219   Cardiac Enzymes: No results for input(s): CKTOTAL, CKMB, CKMBINDEX, TROPONINI in the last 168 hours. BNP (last 3 results) No results for input(s): BNP in the last 8760 hours.  ProBNP (last 3 results) No results for input(s): PROBNP in the last 8760 hours.  CBG:  Recent Labs Lab 10/02/15 2106 10/02/15 2354 10/03/15 0420 10/03/15 0924 10/03/15 1145  GLUCAP 137* 133* 115* 114* 103*    Recent Results (from the past 240 hour(s))  Urine culture     Status: None   Collection Time: 09/23/15  6:40 PM  Result Value Ref Range Status   Specimen Description URINE, CATHETERIZED  Final   Special Requests NONE  Final   Culture 7,000 COLONIES/mL INSIGNIFICANT GROWTH  Final   Report Status 09/25/2015 FINAL  Final  Blood Culture (routine x 2)     Status: None   Collection Time: 09/23/15 11:10 PM  Result Value Ref Range Status   Specimen Description BLOOD LEFT HAND  Final   Special Requests IN PEDIATRIC BOTTLE 3CC  Final   Culture  Setup Time   Final    GRAM POSITIVE COCCI IN CHAINS PEDIATRIC BOTTLE CRITICAL RESULT CALLED TO, READ BACK BY AND VERIFIED WITH: T HOOD,RN AT 1436 09/24/15 BY L BENFIELD    Culture   Final    ENTEROCOCCUS SPECIES CANDIDA ALBICANS CRITICAL RESULT CALLED TO, READ BACK BY AND VERIFIED WITH: H MILLS,RN AT 1101 09/25/15 BY L BENFIELD    Report Status 09/28/2015 FINAL  Final   Organism ID, Bacteria ENTEROCOCCUS SPECIES  Final      Susceptibility   Enterococcus species - MIC*    AMPICILLIN <=2 SENSITIVE Sensitive     VANCOMYCIN 1 SENSITIVE Sensitive     GENTAMICIN SYNERGY SENSITIVE Sensitive     LINEZOLID 2 SENSITIVE Sensitive     * ENTEROCOCCUS SPECIES  Blood Culture (routine x 2)     Status: None   Collection Time: 09/23/15 11:11 PM  Result Value Ref Range Status   Specimen  Description BLOOD RIGHT HAND  Final   Special Requests IN PEDIATRIC BOTTLE 1CC  Final   Culture  Setup Time   Final    GRAM POSITIVE COCCI IN CHAINS PEDIATRIC BOTTLE CRITICAL RESULT CALLED TO, READ BACK BY AND VERIFIED WITH: T HOOD,RN AT 1250 09/24/15 BY L BENFIELD    Culture   Final    ENTEROCOCCUS SPECIES SUSCEPTIBILITIES PERFORMED ON PREVIOUS CULTURE WITHIN THE LAST 5 DAYS. CANDIDA ALBICANS CRITICAL RESULT CALLED TO, READ BACK BY AND VERIFIED WITH: H MILLS,RN AT 1101 09/25/15 BY L BENFIELD    Report Status 09/28/2015 FINAL  Final  Culture, respiratory (NON-Expectorated)     Status: None   Collection Time: 09/24/15 12:37 AM  Result Value Ref Range Status   Specimen Description SPUTUM  Final   Special Requests SUCTIONED  Final   Gram Stain   Final    MODERATE WBC PRESENT, PREDOMINANTLY PMN FEW SQUAMOUS EPITHELIAL CELLS PRESENT FEW GRAM POSITIVE COCCI IN PAIRS FEW YEAST FEW GRAM NEGATIVE RODS Performed at [facility]    Culture   Final    NORMAL OROPHARYNGEAL FLORA Performed at [facility]    Report Status 09/26/2015 FINAL  Final  Culture, blood (routine x 2)     Status: None   Collection Time: 09/25/15  9:14 AM  Result Value Ref Range Status   Specimen Description BLOOD LEFT ANTECUBITAL  Final   Special Requests BOTTLES DRAWN AEROBIC ONLY 6CC  Final   Culture  Setup Time   Final    YEAST AEROBIC BOTTLE ONLY CRITICAL RESULT CALLED TO, READ BACK BY AND VERIFIED WITH: A AGUIRRE 09/26/15 @ [place]  Final   Report Status 09/28/2015 FINAL  Final  Culture, blood (routine x 2)     Status: None   Collection Time: 09/25/15  9:22 AM  Result Value Ref Range Status   Specimen Description BLOOD LEFT HAND  Final   Special Requests BOTTLES DRAWN AEROBIC ONLY 5CC  Final   Culture  Setup Time   Final    YEAST AEROBIC BOTTLE ONLY CRITICAL RESULT CALLED TO, READ BACK BY AND VERIFIED WITH: A AGUIRRE 09/26/15 @ [place]  Final   Report Status 09/28/2015 FINAL  Final  Culture, blood (routine x 2)     Status: None  Collection Time: 09/26/15  9:25 AM  Result Value Ref Range Status   Specimen Description BLOOD RIGHT HAND  Final   Special Requests BOTTLES DRAWN AEROBIC ONLY 1CC  Final   Culture  Setup Time   Final    BUDDING YEAST SEEN PEDIATRIC BOTTLE CRITICAL RESULT CALLED TO, READ BACK BY AND VERIFIED WITH: A AGUIERRE,RN AT 1422 09/28/15 BY L BENFIELD    Culture CANDIDA ALBICANS  Final   Report Status 09/30/2015 FINAL  Final  Culture, blood (routine x 2)     Status: None   Collection Time: 09/26/15  9:35 AM  Result Value Ref Range Status   Specimen Description BLOOD RIGHT HAND  Final   Special Requests IN PEDIATRIC BOTTLE 1CC  Final   Culture  Setup Time   Final    YEAST AEROBIC BOTTLE ONLY CRITICAL RESULT CALLED TO, READ BACK BY AND VERIFIED WITH: A AGUIRRA 09/28/15 @ [place]  Final   Report Status 09/30/2015 FINAL  Final  Culture, blood (routine x 2)     Status: None   Collection Time: 09/28/15  3:20 PM  Result Value Ref Range Status   Specimen Description BLOOD RIGHT ARM  Final   Special Requests IN PEDIATRIC BOTTLE 3CC  Final   Culture NO GROWTH 5 DAYS  Final   Report Status 10/03/2015 FINAL  Final  Culture, blood (routine x 2)     Status: None   Collection Time: 09/28/15  3:37 PM  Result Value Ref Range Status   Specimen Description BLOOD RIGHT HAND  Final   Special Requests IN PEDIATRIC BOTTLE 3CC  Final   Culture NO GROWTH 5 DAYS  Final   Report Status 10/03/2015 FINAL  Final     Studies: Dg Abd 1 View  10/03/2015  CLINICAL DATA:  Feeding tube placement. EXAM: ABDOMEN - 1 VIEW COMPARISON:  None. FINDINGS: A feeding tube is seen in place with the tip overlying the distal duodenum near the ligament of Treitz. This is confirmed by injection of contrast and feeding tube which is seen filling the proximal jejunum. IMPRESSION: Feeding tube  tip overlying distal duodenum near the ligament of Treitz. Electronically Signed   By: Earle Gell M.D.   On: 10/03/2015 09:27   Dg Loyce Dys Tube Plc W/fl-no Rad  10/03/2015  CLINICAL DATA:  NASO G TUBE PLACEMENT WITH FLUORO Fluoroscopy was utilized by the requesting physician.  No radiographic interpretation.    Scheduled Meds: . alteplase  2 mg Intracatheter Once  . alteplase  2 mg Intracatheter Once  . ampicillin (OMNIPEN) IV  2 g Intravenous 6 times per day  . fluconazole (DIFLUCAN) IV  400 mg Intravenous Q24H  . insulin aspart  0-9 Units Subcutaneous 6 times per day  . pantoprazole (PROTONIX) IV  40 mg Intravenous Q12H   Continuous Infusions: . dextrose 5 % 1,000 mL with potassium chloride 40 mEq infusion 100 mL/hr at 10/03/15 0308   Antibiotics Given (last 72 hours)    Date/Time Action Medication Dose Rate   09/30/15 2001 Given   ampicillin (OMNIPEN) 2 g in sodium chloride 0.9 % 50 mL IVPB 2 g 150 mL/hr   10/01/15 0010 Given   ampicillin (OMNIPEN) 2 g in sodium chloride 0.9 % 50 mL IVPB 2 g 150 mL/hr   10/01/15 0511 Given   ampicillin (OMNIPEN) 2 g in sodium chloride 0.9 % 50 mL IVPB 2 g 150 mL/hr   10/01/15 0809 Given  ampicillin (OMNIPEN) 2 g in sodium chloride 0.9 % 50 mL IVPB 2 g 150 mL/hr   10/01/15 1300 Given   ampicillin (OMNIPEN) 2 g in sodium chloride 0.9 % 50 mL IVPB 2 g 150 mL/hr   10/01/15 2033 Given   ampicillin (OMNIPEN) 2 g in sodium chloride 0.9 % 50 mL IVPB 2 g 150 mL/hr   10/02/15 0201 Given   ampicillin (OMNIPEN) 2 g in sodium chloride 0.9 % 50 mL IVPB 2 g 150 mL/hr   10/02/15 0451 Given   ampicillin (OMNIPEN) 2 g in sodium chloride 0.9 % 50 mL IVPB 2 g 150 mL/hr   10/02/15 0849 Given   ampicillin (OMNIPEN) 2 g in sodium chloride 0.9 % 50 mL IVPB 2 g 150 mL/hr   10/02/15 1137 Given   ampicillin (OMNIPEN) 2 g in sodium chloride 0.9 % 50 mL IVPB 2 g 150 mL/hr   10/02/15 1557 Given   ampicillin (OMNIPEN) 2 g in sodium chloride 0.9 % 50 mL IVPB 2 g 150  mL/hr   10/02/15 2141 Given   ampicillin (OMNIPEN) 2 g in sodium chloride 0.9 % 50 mL IVPB 2 g 150 mL/hr   10/03/15 0009 Given   ampicillin (OMNIPEN) 2 g in sodium chloride 0.9 % 50 mL IVPB 2 g 150 mL/hr   10/03/15 0517 Given   ampicillin (OMNIPEN) 2 g in sodium chloride 0.9 % 50 mL IVPB 2 g 150 mL/hr   10/03/15 0801 Given   ampicillin (OMNIPEN) 2 g in sodium chloride 0.9 % 50 mL IVPB 2 g 150 mL/hr   10/03/15 1204 Given   ampicillin (OMNIPEN) 2 g in sodium chloride 0.9 % 50 mL IVPB 2 g 150 mL/hr      Principal Problem:   Severe sepsis (HCC) Active Problems:   Type 2 diabetes mellitus without complication, without long-term current use of insulin (HCC)   AKI (acute kidney injury) ([place])   HCAP (healthcare-associated pneumonia)   DM2 (diabetes mellitus, type 2) ([place])   Pressure ulcer   Hypoxia   Dysphagia, pharyngeal phase   Duodenal ulcer hemorrhage    Time spent: 11min    Ashlyne Olenick A  [facility] Pager [phone]. If 7PM-7AM, please contact night-coverage at [email or web address], password [facility] 10/03/2015, 2:21 PM  LOS: 10 days

## 2015-10-04 ENCOUNTER — Inpatient Hospital Stay (HOSPITAL_COMMUNITY): Payer: Medicaid Other

## 2015-10-04 LAB — CBC
HEMATOCRIT: 30.7 % — AB (ref 36.0–46.0)
Hemoglobin: 9.3 g/dL — ABNORMAL LOW (ref 12.0–15.0)
MCH: 26.6 pg (ref 26.0–34.0)
MCHC: 30.3 g/dL (ref 30.0–36.0)
MCV: 88 fL (ref 78.0–100.0)
PLATELETS: 247 10*3/uL (ref 150–400)
RBC: 3.49 MIL/uL — ABNORMAL LOW (ref 3.87–5.11)
RDW: 16.9 % — AB (ref 11.5–15.5)
WBC: 17.7 10*3/uL — AB (ref 4.0–10.5)

## 2015-10-04 LAB — RENAL FUNCTION PANEL
Albumin: 2.1 g/dL — ABNORMAL LOW (ref 3.5–5.0)
Anion gap: 10 (ref 5–15)
CHLORIDE: 111 mmol/L (ref 101–111)
CO2: 27 mmol/L (ref 22–32)
CREATININE: 1.15 mg/dL — AB (ref 0.44–1.00)
Calcium: 8.3 mg/dL — ABNORMAL LOW (ref 8.9–10.3)
GFR calc Af Amer: 59 mL/min — ABNORMAL LOW (ref >60)
GFR, EST NON AFRICAN AMERICAN: 51 mL/min — AB (ref >60)
GLUCOSE: 180 mg/dL — AB (ref 65–99)
POTASSIUM: 3.5 mmol/L (ref 3.5–5.1)
Phosphorus: 3 mg/dL (ref 2.5–4.6)
Sodium: 148 mmol/L — ABNORMAL HIGH (ref 135–145)

## 2015-10-04 LAB — GLUCOSE, CAPILLARY
GLUCOSE-CAPILLARY: 154 mg/dL — AB (ref 65–99)
GLUCOSE-CAPILLARY: 142 mg/dL — AB (ref 65–99)
GLUCOSE-CAPILLARY: 143 mg/dL — AB (ref 65–99)
GLUCOSE-CAPILLARY: 142 mg/dL — AB (ref 65–99)
Glucose-Capillary: 132 mg/dL — ABNORMAL HIGH (ref 65–99)
Glucose-Capillary: 142 mg/dL — ABNORMAL HIGH (ref 65–99)

## 2015-10-04 MED ORDER — SODIUM CHLORIDE 0.9% FLUSH
10.0000 mL | INTRAVENOUS | Status: DC | PRN
  Administered 2015-10-04 – 2015-10-08 (×4): 10 mL
  Administered 2015-10-11: 20 mL
  Filled 2015-10-04 (×5): qty 40

## 2015-10-04 NOTE — Progress Notes (Signed)
SLP Cancellation Note  Patient Details Name: Ashley Ramsey MRN: [id number] DOB: Jan 04, 1956   Cancelled treatment:        Pt in procedure at this time. Will f/u next date.   Titus Mould 10/04/2015, 2:13 PM

## 2015-10-04 NOTE — Progress Notes (Signed)
Peripherally Inserted Central Catheter/Midline Placement  The IV Nurse has discussed with the patient and/or persons authorized to consent for the patient, the purpose of this procedure and the potential benefits and risks involved with this procedure.  The benefits include less needle sticks, lab draws from the catheter and patient may be discharged home with the catheter.  Risks include, but not limited to, infection, bleeding, blood clot (thrombus formation), and puncture of an artery; nerve damage and irregular heat beat.  Alternatives to this procedure were also discussed.  PICC/Midline Placement Documentation        Ashley Ramsey 10/04/2015, 2:23 PM

## 2015-10-04 NOTE — Clinical Documentation Improvement (Signed)
Internal Medicine  Can the diagnosis of pressure ulcer be further specified by stage and site ? Thank you    Document Site with laterality - Elbow, Back (upper/lower), Sacral, Hip, Buttock, Ankle, Heel, Head, Other (Specify)  Pressure Ulcer Stage - Stage1, Stage 2, Stage 3, Stage 4, Unstageable, Unspecified, Unable to Clinically Determine  Other  Clinically Undetermined    Supporting Information: Nursing Assessment : Neck Anterior, lower, mid, right stage 2   Please exercise your independent, professional judgment when responding. A specific answer is not anticipated or expected.   Thank You,  [place] [phone]

## 2015-10-04 NOTE — Progress Notes (Signed)
[facility] PROGRESS NOTE  KELI ATCHESON [id number] DOB: [birth year] DOA: 09/23/2015 PCP: Arnoldo Morale, MD   Subjective: Place PICC line again today, been 6 days since last culture, still NGTD.  I spoke again with GI today, they recommended IR today with again. I think the patient will be back on TPN after placement of PICC line and that she'll be ready for discharge. Consider placement of PEG tube in 2 weeks, hopefully the ulcers will be better.  HPI: Ashley Ramsey is a 60 year old female with a history of Type 2 DM (A1c 6.2), HTN was diagnosed with laryngeal cancer during a hospitalization at [facility] from 08/29/15 to 09/12/15. On CT neck she was found to have a laryngeal mass, CT abdomen revealed a left renal mass.  ENT was consulted-patient underwent a biopsy on 1/20 confirming malignancy. Hospital course was complicated by development of Massive upper  GI bleeding and associated blood loss anemia, s/p 5 units PRBC. GI was consulted, endoscopy revealed duodenal ulcer; she also had Gastroduodenal artery embolizatyion by IR.  She underwent tracheostomy on 09/04/2015; PEG tube could not be placed due to gastric and duodenal ulcers.  Seen by Oncology and Palliative medicine and Radiation Oncology then, Supposed to FU with ENT at [facility] now admitted with Sepsis, found to have Enterococcal and Candida albicans fungemia PICC removed, last culture was 2/15 still NGTD Place PICC line again.  Assessment/Plan:  Enterococcal bacteremia and Candida albicans fungemia -Likely PICC related -PICC removed 09/25/13 -Clinically improving -Continue Unasyn and fluconazole per ID recommendations -Repeated blood culture still NGTD from 09/28/2015. -2-D echocardiogram complete, no evidence of vegetation, per ID continue antibiotics and fluconazole for one more month.  Dysphagia -Patient has dysphagia, recommended to be NPO. Still has about 5 cm subglottic mass. -She was getting treatment through  TPN, now PICC line discontinued for line holiday. -The PEG tube was not placed during last hospital stay because of presence of duodenal ulcer on 09/03/2013 EGD.  -I had no success of getting procedural services to place PEG tube on this lady. -GI initially recommended general surgery will do it, general surgery recommended GI to relook at the patient. -GI reevaluated her on 2/17, had discussion with Dr. Loletha Carrow, he recommended interventional radiology to do it. -IR recommended to hold off PEG tube for at least 6 weeks because of recent GI bleed. -Unfortunately if PEG tube is unfeasible this time she will be back on TPN.  Anemia  -Acute on chronic, multifactorial  -2 weeks ago found to have proximal stomach and duodenal ulcer, status post EGD on 09/04/2015, then required Gastroduodenal Artery coiling/embolization by IR on 09-07-15, required 5units PRBC then -Keep on PPI, Transfused 2 units this admission, had some dark stools day before and this am RN reporting brown stool  Invasive laryngeal SCC, suspicious lymph nodes involvement per ENT -Seen by  ENT & oncology last month, underwent laryngoscopy/ biopsy on 1/20, about 5.4 subglottic mass. -Followed by Dr.Rosen and he recommended FU with ENT Dr. Fenton Malling at [facility] for Eval for Surgery -s/p tracheostomy placement by ENT on 09/05/2015 -Seen by Oncology Dr.Gorsuch and Rad Onc, and Palliative consult completed -Pt and family wants full aggressive scope of Rx -per Dr.Gorsuch, Patient's prognosis is extremely poor, poor candidate for chemotherapy, also most likely has renal cancer.  -No PEG tube as she has stomach and duodenal ulcers now -Was on TNA via PICC line -SLP eval for PMV and continued swallow eval.  -Seen by GI Dr. Collene Mares  and Hung then, I called and d/w Dr. Benson Norway regarding J tube, he felt that if a J tube is needed then it would need to be done surgically, I doubt she would be considered for Surgical J tube in setting of Enterococcal  and Yeast bacteremia at this time. -Called and d/w Dr.Gorsuch 2/13, she recommended Hospice, unfortunately pt and family want full Aggressive scope of Rx. -Patient should follow with [facility] for her laryngeal cancer.  Left renal mass: Highly suspicious for renal cell carcinoma.  -Case d/w Dr. Ree Kida last admission, recommended outpatient follow-up with him in the office for consideration of nephrectomy in the future  DM -SSI for now  Morbid obesity:  -nutrition via TPN now  Hypernatremia -Sodium is 152 this morning, increase the D5W, give 1 dose of Lasix to avoid fluid overload.   DVT proph:  -SCDs, due to anemia, recent massive GI bleed  Code Status: FUll Code Family Communication: none at bedside, left message for daughter yesterday and today Disposition Plan: Keep in SDU   Objective: Filed Vitals:   10/04/15 1210 10/04/15 1553  BP: 157/76   Pulse: 92 84  Temp: 98 F (36.7 C)   Resp: 18 18    Intake/Output Summary (Last 24 hours) at 10/04/15 1600 Last data filed at 10/04/15 1513  Gross per 24 hour  Intake      0 ml  Output   1653 ml  Net  -1653 ml   Filed Weights   09/23/15 2253 10/03/15 0430 10/04/15 0431  Weight: 91 kg (200 lb 9.9 oz) 87.544 kg (193 lb) 86.682 kg (191 lb 1.6 oz)    Exam:   General:  AAOx3, chronically ill appearing, no distress  HEENT: around trach some phlegm noted  Cardiovascular: S1S2/RRR  Respiratory: conducted upper airway sounds  Abdomen: soft, NT, BS present  Musculoskeletal: no edema c/c   Data Reviewed: Basic Metabolic Panel:  Recent Labs Lab 09/29/15 0330 09/30/15 0400 10/01/15 0248 10/03/15 0609 10/04/15 0603  NA 156* 152* 151* 148* 148*  K 3.5 3.3* 3.1* 3.7 3.5  CL 125* 121* 115* 114* 111  CO2 22 21* 22 24 27   GLUCOSE 143* 125* 136* 119* 180*  BUN 28* 18 13 5* <5*  CREATININE 1.20* 1.03* 1.13* 1.06* 1.15*  CALCIUM 8.1* 7.9* 8.1* 8.0* 8.3*  MG 2.1  --   --   --   --   PHOS  --   --   --    --  3.0   Liver Function Tests:  Recent Labs Lab 10/04/15 0603  ALBUMIN 2.1*   No results for input(s): LIPASE, AMYLASE in the last 168 hours. No results for input(s): AMMONIA in the last 168 hours. CBC:  Recent Labs Lab 09/29/15 0330 09/30/15 0400 10/01/15 0248 10/03/15 1125 10/04/15 0421  WBC 19.6* 17.4* 18.6* 19.9* 17.7*  HGB 8.1* 8.2* 8.5* 9.0* 9.3*  HCT 25.7* 26.0* 27.4* 28.2* 30.7*  MCV 85.7 86.4 86.4 87.6 88.0  PLT 126* 134* 176 219 247   Cardiac Enzymes: No results for input(s): CKTOTAL, CKMB, CKMBINDEX, TROPONINI in the last 168 hours. BNP (last 3 results) No results for input(s): BNP in the last 8760 hours.  ProBNP (last 3 results) No results for input(s): PROBNP in the last 8760 hours.  CBG:  Recent Labs Lab 10/03/15 2021 10/04/15 0016 10/04/15 0407 10/04/15 0925 10/04/15 1203  GLUCAP 110* 142* 154* 142* 143*    Recent Results (from the past 240 hour(s))  Culture, blood (routine x  2)     Status: None   Collection Time: 09/25/15  9:14 AM  Result Value Ref Range Status   Specimen Description BLOOD LEFT ANTECUBITAL  Final   Special Requests BOTTLES DRAWN AEROBIC ONLY 6CC  Final   Culture  Setup Time   Final    YEAST AEROBIC BOTTLE ONLY CRITICAL RESULT CALLED TO, READ BACK BY AND VERIFIED WITH: A AGUIRRE 09/26/15 @ [place]  Final   Report Status 09/28/2015 FINAL  Final  Culture, blood (routine x 2)     Status: None   Collection Time: 09/25/15  9:22 AM  Result Value Ref Range Status   Specimen Description BLOOD LEFT HAND  Final   Special Requests BOTTLES DRAWN AEROBIC ONLY 5CC  Final   Culture  Setup Time   Final    YEAST AEROBIC BOTTLE ONLY CRITICAL RESULT CALLED TO, READ BACK BY AND VERIFIED WITH: A AGUIRRE 09/26/15 @ [place]  Final   Report Status 09/28/2015 FINAL  Final  Culture, blood (routine x 2)     Status: None   Collection Time: 09/26/15  9:25 AM  Result Value  Ref Range Status   Specimen Description BLOOD RIGHT HAND  Final   Special Requests BOTTLES DRAWN AEROBIC ONLY 1CC  Final   Culture  Setup Time   Final    BUDDING YEAST SEEN PEDIATRIC BOTTLE CRITICAL RESULT CALLED TO, READ BACK BY AND VERIFIED WITH: A AGUIERRE,RN AT 1422 09/28/15 BY L BENFIELD    Culture CANDIDA ALBICANS  Final   Report Status 09/30/2015 FINAL  Final  Culture, blood (routine x 2)     Status: None   Collection Time: 09/26/15  9:35 AM  Result Value Ref Range Status   Specimen Description BLOOD RIGHT HAND  Final   Special Requests IN PEDIATRIC BOTTLE 1CC  Final   Culture  Setup Time   Final    YEAST AEROBIC BOTTLE ONLY CRITICAL RESULT CALLED TO, READ BACK BY AND VERIFIED WITH: A AGUIRRA 09/28/15 @ [place]  Final   Report Status 09/30/2015 FINAL  Final  Culture, blood (routine x 2)     Status: None   Collection Time: 09/28/15  3:20 PM  Result Value Ref Range Status   Specimen Description BLOOD RIGHT ARM  Final   Special Requests IN PEDIATRIC BOTTLE 3CC  Final   Culture NO GROWTH 5 DAYS  Final   Report Status 10/03/2015 FINAL  Final  Culture, blood (routine x 2)     Status: None   Collection Time: 09/28/15  3:37 PM  Result Value Ref Range Status   Specimen Description BLOOD RIGHT HAND  Final   Special Requests IN PEDIATRIC BOTTLE 3CC  Final   Culture NO GROWTH 5 DAYS  Final   Report Status 10/03/2015 FINAL  Final     Studies: Dg Abd 1 View  10/03/2015  CLINICAL DATA:  Feeding tube placement. EXAM: ABDOMEN - 1 VIEW COMPARISON:  None. FINDINGS: A feeding tube is seen in place with the tip overlying the distal duodenum near the ligament of Treitz. This is confirmed by injection of contrast and feeding tube which is seen filling the proximal jejunum. IMPRESSION: Feeding tube tip overlying distal duodenum near the ligament of Treitz. Electronically Signed   By: Earle Gell M.D.   On: 10/03/2015 09:27   Dg Chest Port 1 View  10/04/2015  CLINICAL DATA:  Evaluate central line placement EXAM: PORTABLE CHEST 1 VIEW COMPARISON:  09/25/2015 FINDINGS: Right upper extremity PICC. Tip placement is indeterminate due to underpenetration and artifact from EKG leads. The tip is the level of the right atrium, depth uncertain. Would retracted by 3-4 cm. New feeding tube with tip at least reaching the stomach. Tracheostomy tube is well seated. Improved inflation compared to prior. No pneumonia or edema. IMPRESSION: Right upper extremity PICC with tip at the right atrium, depth uncertain due to technical factors. Suggest retraction by 3-4 cm and repeat radiograph. Electronically Signed   By: Monte Fantasia M.D.   On: 10/04/2015 14:47   Dg Addison Bailey G Tube Plc W/fl-no Rad  10/03/2015  CLINICAL DATA:  NASO G TUBE PLACEMENT WITH FLUORO Fluoroscopy was utilized by the requesting physician.  No radiographic interpretation.    Scheduled Meds: . alteplase  2 mg Intracatheter Once  . alteplase  2 mg Intracatheter Once  . ampicillin (OMNIPEN) IV  2 g Intravenous 6 times per day  . fluconazole (DIFLUCAN) IV  400 mg Intravenous Q24H  . insulin aspart  0-9 Units Subcutaneous 6 times per day  . pantoprazole (PROTONIX) IV  40 mg Intravenous Q12H   Continuous Infusions: . dextrose 5 % 1,000 mL with potassium chloride 40 mEq infusion 100 mL/hr at 10/04/15 0948  . feeding supplement (VITAL 1.5 CAL) 1,000 mL (10/03/15 2347)   Antibiotics Given (last 72 hours)    Date/Time Action Medication Dose Rate   10/01/15 2033 Given   ampicillin (OMNIPEN) 2 g in sodium chloride 0.9 % 50 mL IVPB 2 g 150 mL/hr   10/02/15 0201 Given   ampicillin (OMNIPEN) 2 g in sodium chloride 0.9 % 50 mL IVPB 2 g 150 mL/hr   10/02/15 0451 Given   ampicillin (OMNIPEN) 2 g in sodium chloride 0.9 % 50 mL IVPB 2 g 150 mL/hr   10/02/15 0849 Given   ampicillin (OMNIPEN) 2 g in sodium chloride 0.9 % 50 mL IVPB 2 g 150 mL/hr   10/02/15 1137 Given   ampicillin (OMNIPEN) 2 g in sodium chloride 0.9  % 50 mL IVPB 2 g 150 mL/hr   10/02/15 1557 Given   ampicillin (OMNIPEN) 2 g in sodium chloride 0.9 % 50 mL IVPB 2 g 150 mL/hr   10/02/15 2141 Given   ampicillin (OMNIPEN) 2 g in sodium chloride 0.9 % 50 mL IVPB 2 g 150 mL/hr   10/03/15 0009 Given   ampicillin (OMNIPEN) 2 g in sodium chloride 0.9 % 50 mL IVPB 2 g 150 mL/hr   10/03/15 0517 Given   ampicillin (OMNIPEN) 2 g in sodium chloride 0.9 % 50 mL IVPB 2 g 150 mL/hr   10/03/15 0801 Given   ampicillin (OMNIPEN) 2 g in sodium chloride 0.9 % 50 mL IVPB 2 g 150 mL/hr   10/03/15 1204 Given   ampicillin (OMNIPEN) 2 g in sodium chloride 0.9 % 50 mL IVPB 2 g 150 mL/hr   10/03/15 1645 Given   ampicillin (OMNIPEN) 2 g in sodium chloride 0.9 % 50 mL IVPB 2 g 150 mL/hr   10/03/15 2056 Given   ampicillin (OMNIPEN) 2 g in sodium chloride 0.9 % 50 mL IVPB 2 g 150 mL/hr   10/03/15 2350 Given   ampicillin (OMNIPEN) 2 g in sodium chloride 0.9 % 50 mL IVPB 2 g 150 mL/hr   10/04/15 0800 Given   ampicillin (OMNIPEN) 2 g in sodium chloride 0.9 % 50 mL IVPB 2 g 150  mL/hr   10/04/15 1227 Given   ampicillin (OMNIPEN) 2 g in sodium chloride 0.9 % 50 mL IVPB 2 g 150 mL/hr      Principal Problem:   Severe sepsis (HCC) Active Problems:   Type 2 diabetes mellitus without complication, without long-term current use of insulin (HCC)   AKI (acute kidney injury) ([place])   HCAP (healthcare-associated pneumonia)   DM2 (diabetes mellitus, type 2) (HCC)   Pressure ulcer   Hypoxia   Dysphagia, pharyngeal phase   Duodenal ulcer hemorrhage    Time spent: 12min    Demitrus Francisco A  [facility] Pager [phone]. If 7PM-7AM, please contact night-coverage at [email or web address], password [facility] 10/04/2015, 4:00 PM  LOS: 11 days

## 2015-10-05 ENCOUNTER — Telehealth: Payer: Self-pay

## 2015-10-05 LAB — CBC
HEMATOCRIT: 26.5 % — AB (ref 36.0–46.0)
HEMOGLOBIN: 8.3 g/dL — AB (ref 12.0–15.0)
MCH: 27.6 pg (ref 26.0–34.0)
MCHC: 31.3 g/dL (ref 30.0–36.0)
MCV: 88 fL (ref 78.0–100.0)
Platelets: 222 10*3/uL (ref 150–400)
RBC: 3.01 MIL/uL — ABNORMAL LOW (ref 3.87–5.11)
RDW: 16.7 % — AB (ref 11.5–15.5)
WBC: 16.6 10*3/uL — ABNORMAL HIGH (ref 4.0–10.5)

## 2015-10-05 LAB — RENAL FUNCTION PANEL
ANION GAP: 10 (ref 5–15)
Albumin: 2 g/dL — ABNORMAL LOW (ref 3.5–5.0)
CHLORIDE: 109 mmol/L (ref 101–111)
CO2: 28 mmol/L (ref 22–32)
Calcium: 8.1 mg/dL — ABNORMAL LOW (ref 8.9–10.3)
Creatinine, Ser: 1.1 mg/dL — ABNORMAL HIGH (ref 0.44–1.00)
GFR calc Af Amer: >60 mL/min (ref >60)
GFR calc non Af Amer: 54 mL/min — ABNORMAL LOW (ref >60)
GLUCOSE: 161 mg/dL — AB (ref 65–99)
PHOSPHORUS: 2.7 mg/dL (ref 2.5–4.6)
POTASSIUM: 3.4 mmol/L — AB (ref 3.5–5.1)
Sodium: 147 mmol/L — ABNORMAL HIGH (ref 135–145)

## 2015-10-05 LAB — GLUCOSE, CAPILLARY
GLUCOSE-CAPILLARY: 138 mg/dL — AB (ref 65–99)
GLUCOSE-CAPILLARY: 152 mg/dL — AB (ref 65–99)
GLUCOSE-CAPILLARY: 147 mg/dL — AB (ref 65–99)
Glucose-Capillary: 140 mg/dL — ABNORMAL HIGH (ref 65–99)
Glucose-Capillary: 142 mg/dL — ABNORMAL HIGH (ref 65–99)
Glucose-Capillary: 158 mg/dL — ABNORMAL HIGH (ref 65–99)

## 2015-10-05 NOTE — Progress Notes (Signed)
Speech Language Pathology Treatment: Nada Boozer Speaking valve  Patient Details Name: Ashley Ramsey MRN: [id number] DOB: 1955/08/29 Today's Date: 10/05/2015 Time:  8:21-8:42  Assessment / Plan / Recommendation Clinical Impression  PMSV donned throughout session with no change in pulse or oxygen saturation. Pt able to speak in phrases/sentences, however intelligibility reduced due to decreased respiratory support and decreased vocal intensity. Pt instructed re: Mare Ferrari free water protocol (ice chips/water after oral care, full supervision only, 1 oz water/ice chips) for comfort and hydration due to NPO status. Pt can wear PMSV during all waking hours (remove to sleep). Please make sure PMSV donned when discussing her care.    HPI HPI: 60 y.o. female with h/o laryngeal cancer, s/p trach, type 2 DM, who presented to ED due to confusion. Pt problem list includes: severe sepsis possibly due to LLL HCAP, infection in PICC line. Last seen by SLP 1/31 for PMSV tx. Most recent MBSS 1/26 recommended NPO status except for free water protocol under strict guidelines. Pt remained NPO at d/c last month, with nutrition via TPN. Pt reported PMSV use at home as well as PO intake.      SLP Plan  Continue with current plan of care     Recommendations  Liquids provided via: Teaspoon Medication Administration: Via alternative means Supervision: Full supervision/cueing for compensatory strategies      Patient may use Passy-Muir Speech Valve: During all waking hours (remove during sleep) PMSV Supervision: Intermittent      Oral Care Recommendations: Oral care QID;Oral care prior to ice chip/H20 Follow up Recommendations: Home health SLP Plan: Continue with current plan of care     GO                Evert Wenrich 10/05/2015, 1:24 PM  Titus Mould, Student-SLP

## 2015-10-05 NOTE — Progress Notes (Signed)
Tube feeding has been advanced from 59mL to 94mL (increase by 54mL as ordered). Flushes were also programmed to flush with 18mL q4h, also as ordered.

## 2015-10-05 NOTE — Telephone Encounter (Signed)
Call placed to Olga Coaster, RN CM to inform her that the patient is known to the [facility]. Will continue to follow her hospital course and assist with scheduling a follow up appointment when the discharge date is confirmed.

## 2015-10-05 NOTE — Progress Notes (Signed)
[facility] PROGRESS NOTE  Ashley Ramsey [id number] DOB: Aug 31, 1955 DOA: 09/23/2015 PCP: Arnoldo Morale, MD   Subjective: Place PICC line again today, been 6 days since last culture, still NGTD.  I spoke again with GI today, they recommended IR today with again. I think the patient will be back on TPN after placement of PICC line and that she'll be ready for discharge. Consider placement of PEG tube in 2 weeks, hopefully the ulcers will be better.  HPI: Ashley Ramsey is a 60 year old female with a history of Type 2 DM (A1c 6.2), HTN was diagnosed with laryngeal cancer during a hospitalization at [facility] from 08/29/15 to 09/12/15. On CT neck she was found to have a laryngeal mass, CT abdomen revealed a left renal mass.  ENT was consulted-patient underwent a biopsy on 1/20 confirming malignancy. Hospital course was complicated by development of Massive upper  GI bleeding and associated blood loss anemia, s/p 5 units PRBC. GI was consulted, endoscopy revealed duodenal ulcer; she also had Gastroduodenal artery embolizatyion by IR.  She underwent tracheostomy on 09/04/2015; PEG tube could not be placed due to gastric and duodenal ulcers.  Seen by Oncology and Palliative medicine and Radiation Oncology then, Supposed to FU with ENT at [facility] now admitted with Sepsis, found to have Enterococcal and Candida albicans fungemia PICC removed, last culture was 2/15 still NGTD  PICC Placed 2/21  Assessment/Plan:  Enterococcal bacteremia and Candida albicans fungemia -Likely PICC related -PICC removed 09/25/13 -Clinically improving -Continue Ampicillin and fluconazole per ID recommendations for 1 month -Repeated blood culture still NGTD from 09/28/2015. -2-D echocardiogram complete, no evidence of vegetation -PICC Replaced 2/21 -FU with ID  Dysphagia -Patient has dysphagia, recommended to be NPO, has a 5 cm subglottic mass. -She was getting TNA via PICC, then PICC line was discontinued  for line holiday. -The PEG tube was not placed during last hospital stay because of presence of gastric and duodenal ulcer on 09/03/2013 EGD.  -discussed with GI/Surgery and IR, no service ready to place PEG at this time. -GI initially recommended general surgery eval, general surgery recommended GI to relook at the patient. -GI reevaluated her on 2/17, Dr.Elmahi had discussion with Dr. Loletha Carrow, he recommended interventional radiology. -IR recommended to hold off PEG tube for at least 6 weeks because of recent GI bleed and embolization procedure -Unfortunately if PEG tube is unfeasible this time she will be back on TPN. -now on Tube feeds via Panda tube too  Anemia  -Acute on chronic, multifactorial  -2 weeks ago found to have proximal stomach and duodenal ulcer, status post EGD on 09/04/2015, then required Gastroduodenal Artery coiling/embolization by IR on 09-07-15, required 5units PRBC then -Keep on PPI, Transfused 2 units this admission, had some dark stools day before and this am RN reporting brown stool  Invasive laryngeal SCC, suspicious lymph nodes involvement per ENT -Seen by  ENT & oncology last month, underwent laryngoscopy/ biopsy on 1/20, about 5.4 subglottic mass. -Followed by Dr.Rosen and he recommended FU with ENT Dr. Fenton Malling at [facility] for Eval for Surgery -s/p tracheostomy placement by ENT on 09/05/2015 -Seen by Oncology Dr.Gorsuch and Rad Onc, and Palliative consult completed -Pt and family wants full aggressive scope of Rx -per Dr.Gorsuch, Patient's prognosis is extremely poor, poor candidate for chemotherapy, also most likely has renal cancer.  -No PEG tube as she has stomach and duodenal ulcers now -Was on TNA via PICC line -SLP eval for PMV and continued swallow  eval.  -Seen by GI Dr. Collene Mares and Benson Norway then, I called and d/w Dr. Benson Norway regarding J tube, he felt that if a J tube is needed then it would need to be done surgically, I doubt she would be considered for  Surgical J tube in setting of Enterococcal and Yeast bacteremia at this time. -Called and d/w Dr.Gorsuch 2/13, she recommended Hospice, unfortunately pt and family want full Aggressive scope of Rx. -Patient should follow with [facility] for her laryngeal cancer.  Left renal mass: Highly suspicious for renal cell carcinoma.  -Case d/w Dr. Ree Kida last admission, recommended outpatient follow-up with him in the office for consideration of nephrectomy in the future  DM -SSI for now  Morbid obesity:  -nutrition via TPN now  Hypernatremia -Sodium is 152 this morning, increase the D5W, give 1 dose of Lasix to avoid fluid overload.   DVT proph:  -SCDs, due to anemia, recent massive GI bleed  Code Status: FUll Code Family Communication: none at bedside, left message for daughter yesterday and today Disposition Plan: Keep in SDU   Objective: Filed Vitals:   10/05/15 0529 10/05/15 1206  BP: 154/84 143/88  Pulse: 98 86  Temp: 98 F (36.7 C) 99.1 F (37.3 C)  Resp: 20 20    Intake/Output Summary (Last 24 hours) at 10/05/15 1653 Last data filed at 10/05/15 1519  Gross per 24 hour  Intake 5651.67 ml  Output   1702 ml  Net 3949.67 ml   Filed Weights   10/03/15 0430 10/04/15 0431 10/05/15 0529  Weight: 87.544 kg (193 lb) 86.682 kg (191 lb 1.6 oz) 86.365 kg (190 lb 6.4 oz)    Exam:   General:  AAOx3, chronically ill appearing, no distress  HEENT: around trach some phlegm noted  Cardiovascular: S1S2/RRR  Respiratory: conducted upper airway sounds  Abdomen: soft, NT, BS present  Musculoskeletal: no edema c/c   Data Reviewed: Basic Metabolic Panel:  Recent Labs Lab 09/29/15 0330 09/30/15 0400 10/01/15 0248 10/03/15 0609 10/04/15 0603 10/05/15 0605  NA 156* 152* 151* 148* 148* 147*  K 3.5 3.3* 3.1* 3.7 3.5 3.4*  CL 125* 121* 115* 114* 111 109  CO2 22 21* 22 24 27 28   GLUCOSE 143* 125* 136* 119* 180* 161*  BUN 28* 18 13 5* <5* <5*  CREATININE  1.20* 1.03* 1.13* 1.06* 1.15* 1.10*  CALCIUM 8.1* 7.9* 8.1* 8.0* 8.3* 8.1*  MG 2.1  --   --   --   --   --   PHOS  --   --   --   --  3.0 2.7   Liver Function Tests:  Recent Labs Lab 10/04/15 0603 10/05/15 0605  ALBUMIN 2.1* 2.0*   No results for input(s): LIPASE, AMYLASE in the last 168 hours. No results for input(s): AMMONIA in the last 168 hours. CBC:  Recent Labs Lab 09/30/15 0400 10/01/15 0248 10/03/15 1125 10/04/15 0421 10/05/15 0605  WBC 17.4* 18.6* 19.9* 17.7* 16.6*  HGB 8.2* 8.5* 9.0* 9.3* 8.3*  HCT 26.0* 27.4* 28.2* 30.7* 26.5*  MCV 86.4 86.4 87.6 88.0 88.0  PLT 134* 176 219 247 222   Cardiac Enzymes: No results for input(s): CKTOTAL, CKMB, CKMBINDEX, TROPONINI in the last 168 hours. BNP (last 3 results) No results for input(s): BNP in the last 8760 hours.  ProBNP (last 3 results) No results for input(s): PROBNP in the last 8760 hours.  CBG:  Recent Labs Lab 10/05/15 0006 10/05/15 0403 10/05/15 [id number] 10/05/15 1215 10/05/15 1635  GLUCAP 142* 158* 138* 152* 147*    Recent Results (from the past 240 hour(s))  Culture, blood (routine x 2)     Status: None   Collection Time: 09/26/15  9:25 AM  Result Value Ref Range Status   Specimen Description BLOOD RIGHT HAND  Final   Special Requests BOTTLES DRAWN AEROBIC ONLY 1CC  Final   Culture  Setup Time   Final    BUDDING YEAST SEEN PEDIATRIC BOTTLE CRITICAL RESULT CALLED TO, READ BACK BY AND VERIFIED WITH: A AGUIERRE,RN AT 1422 09/28/15 BY L BENFIELD    Culture CANDIDA ALBICANS  Final   Report Status 09/30/2015 FINAL  Final  Culture, blood (routine x 2)     Status: None   Collection Time: 09/26/15  9:35 AM  Result Value Ref Range Status   Specimen Description BLOOD RIGHT HAND  Final   Special Requests IN PEDIATRIC BOTTLE 1CC  Final   Culture  Setup Time   Final    YEAST AEROBIC BOTTLE ONLY CRITICAL RESULT CALLED TO, READ BACK BY AND VERIFIED WITH: A AGUIRRA 09/28/15 @ [place]  Final   Report Status 09/30/2015 FINAL  Final  Culture, blood (routine x 2)     Status: None   Collection Time: 09/28/15  3:20 PM  Result Value Ref Range Status   Specimen Description BLOOD RIGHT ARM  Final   Special Requests IN PEDIATRIC BOTTLE 3CC  Final   Culture NO GROWTH 5 DAYS  Final   Report Status 10/03/2015 FINAL  Final  Culture, blood (routine x 2)     Status: None   Collection Time: 09/28/15  3:37 PM  Result Value Ref Range Status   Specimen Description BLOOD RIGHT HAND  Final   Special Requests IN PEDIATRIC BOTTLE 3CC  Final   Culture NO GROWTH 5 DAYS  Final   Report Status 10/03/2015 FINAL  Final     Studies: Dg Chest Port 1 View  10/04/2015  CLINICAL DATA:  Evaluate central line placement EXAM: PORTABLE CHEST 1 VIEW COMPARISON:  09/25/2015 FINDINGS: Right upper extremity PICC. Tip placement is indeterminate due to underpenetration and artifact from EKG leads. The tip is the level of the right atrium, depth uncertain. Would retracted by 3-4 cm. New feeding tube with tip at least reaching the stomach. Tracheostomy tube is well seated. Improved inflation compared to prior. No pneumonia or edema. IMPRESSION: Right upper extremity PICC with tip at the right atrium, depth uncertain due to technical factors. Suggest retraction by 3-4 cm and repeat radiograph. Electronically Signed   By: Monte Fantasia M.D.   On: 10/04/2015 14:47    Scheduled Meds: . alteplase  2 mg Intracatheter Once  . alteplase  2 mg Intracatheter Once  . ampicillin (OMNIPEN) IV  2 g Intravenous 6 times per day  . fluconazole (DIFLUCAN) IV  400 mg Intravenous Q24H  . insulin aspart  0-9 Units Subcutaneous 6 times per day  . pantoprazole (PROTONIX) IV  40 mg Intravenous Q12H   Continuous Infusions: . dextrose 5 % 1,000 mL with potassium chloride 40 mEq infusion 100 mL/hr at 10/05/15 1444  . feeding supplement (VITAL 1.5 CAL) 1,000 mL (10/05/15 1048)   Antibiotics Given (last 72 hours)     Date/Time Action Medication Dose Rate   10/02/15 2141 Given   ampicillin (OMNIPEN) 2 g in sodium chloride 0.9 % 50 mL IVPB 2 g 150 mL/hr   10/03/15 0009 Given   ampicillin (  OMNIPEN) 2 g in sodium chloride 0.9 % 50 mL IVPB 2 g 150 mL/hr   10/03/15 0517 Given   ampicillin (OMNIPEN) 2 g in sodium chloride 0.9 % 50 mL IVPB 2 g 150 mL/hr   10/03/15 0801 Given   ampicillin (OMNIPEN) 2 g in sodium chloride 0.9 % 50 mL IVPB 2 g 150 mL/hr   10/03/15 1204 Given   ampicillin (OMNIPEN) 2 g in sodium chloride 0.9 % 50 mL IVPB 2 g 150 mL/hr   10/03/15 1645 Given   ampicillin (OMNIPEN) 2 g in sodium chloride 0.9 % 50 mL IVPB 2 g 150 mL/hr   10/03/15 2056 Given   ampicillin (OMNIPEN) 2 g in sodium chloride 0.9 % 50 mL IVPB 2 g 150 mL/hr   10/03/15 2350 Given   ampicillin (OMNIPEN) 2 g in sodium chloride 0.9 % 50 mL IVPB 2 g 150 mL/hr   10/04/15 0800 Given   ampicillin (OMNIPEN) 2 g in sodium chloride 0.9 % 50 mL IVPB 2 g 150 mL/hr   10/04/15 1227 Given   ampicillin (OMNIPEN) 2 g in sodium chloride 0.9 % 50 mL IVPB 2 g 150 mL/hr   10/04/15 1718 Given   ampicillin (OMNIPEN) 2 g in sodium chloride 0.9 % 50 mL IVPB 2 g 150 mL/hr   10/04/15 1941 Given   ampicillin (OMNIPEN) 2 g in sodium chloride 0.9 % 50 mL IVPB 2 g 150 mL/hr   10/05/15 0022 Given   ampicillin (OMNIPEN) 2 g in sodium chloride 0.9 % 50 mL IVPB 2 g 150 mL/hr   10/05/15 0401 Given   ampicillin (OMNIPEN) 2 g in sodium chloride 0.9 % 50 mL IVPB 2 g 150 mL/hr   10/05/15 0751 Given   ampicillin (OMNIPEN) 2 g in sodium chloride 0.9 % 50 mL IVPB 2 g 150 mL/hr   10/05/15 1210 Given   ampicillin (OMNIPEN) 2 g in sodium chloride 0.9 % 50 mL IVPB 2 g 150 mL/hr      Principal Problem:   Severe sepsis (HCC) Active Problems:   Type 2 diabetes mellitus without complication, without long-term current use of insulin (HCC)   AKI (acute kidney injury) ([place])   HCAP (healthcare-associated pneumonia)   DM2 (diabetes mellitus, type 2) ([place])   Pressure  ulcer   Hypoxia   Dysphagia, pharyngeal phase   Duodenal ulcer hemorrhage    Time spent: 65min    Ebin Palazzi  [facility] Pager [phone]. If 7PM-7AM, please contact night-coverage at [email or web address], password [facility] 10/05/2015, 4:53 PM  LOS: 12 days

## 2015-10-05 NOTE — Progress Notes (Signed)
Nutrition Follow-up  DOCUMENTATION CODES:   Obesity unspecified  INTERVENTION:   Recommend monitoring magnesium, potassium, and phosphorus daily for at least 3 days, MD to replete as needed, as pt is at risk for refeeding syndrome given NPO status (and TPN held) >7 days.  Continue Vital 1.5 @ 30 ml/hr via NGT and increase by 10 ml every 8 hours to goal rate of 55 ml/hr.   Tube feeding regimen provides 1980 kcal (100% of needs), 89 grams of protein, and 1003 ml of H2O.   When IV fluids are discontinued, add 100 ml free water flushes every 3 hours to provide an additional 800 ml daily.   NUTRITION DIAGNOSIS:   Inadequate oral intake related to inability to eat as evidenced by NPO status.  Ongoing  GOAL:   Patient will meet greater than or equal to 90% of their needs  Unmet  MONITOR:   Labs, Weight trends, Skin, I & O's  REASON FOR ASSESSMENT:   Consult Enteral/tube feeding initiation and management  ASSESSMENT:   60 y.o. Female with h/o laryngeal cancer, s/p trach, currently on TPN for nutrition. All of this occurred during a Jan 16th admission. Patient apparently has been declining in general since time of discharge but is especially more confused in past couple of days. WBC today had increased to 28k from 9 days ago. Patient sent to ED for eval.  Pt sitting on edge of bed at time of visit with tube feedings infusing via NGT @ 20 ml/hr. Pt denies any abdominal pain or nausea. Pt's weight is down an additional 3 lbs from 2 days ago. Per RN, tube feedings have not been advanced due to patient having loose stools yesterday and this morning. Discussed with RN, to slowly advance tube feedings unless pt has N/V or abdominal pain. Discussed plan with pt to increase tube feeding rate gradually today and she is in agreement with plan. RN has now increased TF rate of Vital 1.5 to 30 ml/hr.   Labs: glucose ranging 132 to 180 mg/dL, low hemoglobin, low potassium, high sodium,    Diet Order:  Diet NPO time specified  Skin:  Wound (see comment) (stage II pressure ulcer on neck)  Last BM:  2/22 diarrhea per RN  Height:   Ht Readings from Last 1 Encounters:  09/23/15 5\' 3"  (1.6 m)    Weight:   Wt Readings from Last 1 Encounters:  10/05/15 190 lb 6.4 oz (86.365 kg)    Ideal Body Weight:  52.27 kg  BMI:  Body mass index is 33.74 kg/(m^2).  Estimated Nutritional Needs:   Kcal:  1900-2100  Protein:  85-100 grams  Fluid:  1.9-2 L/day  EDUCATION NEEDS:   No education needs identified at this time  [place], LDN Inpatient Clinical Dietitian Pager: [phone] After Hours Pager: [phone]

## 2015-10-06 LAB — GLUCOSE, CAPILLARY
GLUCOSE-CAPILLARY: 180 mg/dL — AB (ref 65–99)
GLUCOSE-CAPILLARY: 211 mg/dL — AB (ref 65–99)
GLUCOSE-CAPILLARY: 178 mg/dL — AB (ref 65–99)
GLUCOSE-CAPILLARY: 131 mg/dL — AB (ref 65–99)
Glucose-Capillary: 138 mg/dL — ABNORMAL HIGH (ref 65–99)
Glucose-Capillary: 144 mg/dL — ABNORMAL HIGH (ref 65–99)
Glucose-Capillary: 180 mg/dL — ABNORMAL HIGH (ref 65–99)

## 2015-10-06 LAB — RENAL FUNCTION PANEL
Albumin: 1.9 g/dL — ABNORMAL LOW (ref 3.5–5.0)
Anion gap: 13 (ref 5–15)
CHLORIDE: 106 mmol/L (ref 101–111)
CO2: 27 mmol/L (ref 22–32)
CREATININE: 1.01 mg/dL — AB (ref 0.44–1.00)
Calcium: 8.2 mg/dL — ABNORMAL LOW (ref 8.9–10.3)
GFR calc non Af Amer: 60 mL/min — ABNORMAL LOW (ref >60)
GLUCOSE: 213 mg/dL — AB (ref 65–99)
Phosphorus: 2.6 mg/dL (ref 2.5–4.6)
Potassium: 3.4 mmol/L — ABNORMAL LOW (ref 3.5–5.1)
SODIUM: 146 mmol/L — AB (ref 135–145)

## 2015-10-06 LAB — CBC
HCT: 27.9 % — ABNORMAL LOW (ref 36.0–46.0)
Hemoglobin: 8.5 g/dL — ABNORMAL LOW (ref 12.0–15.0)
MCH: 26.6 pg (ref 26.0–34.0)
MCHC: 30.5 g/dL (ref 30.0–36.0)
MCV: 87.5 fL (ref 78.0–100.0)
PLATELETS: 228 10*3/uL (ref 150–400)
RBC: 3.19 MIL/uL — ABNORMAL LOW (ref 3.87–5.11)
RDW: 16.5 % — AB (ref 11.5–15.5)
WBC: 16.8 10*3/uL — ABNORMAL HIGH (ref 4.0–10.5)

## 2015-10-06 MED ORDER — FREE WATER
300.0000 mL | Freq: Four times a day (QID) | Status: DC
  Administered 2015-10-06 – 2015-10-11 (×15): 300 mL

## 2015-10-06 NOTE — Progress Notes (Signed)
Nutrition Follow-up  DOCUMENTATION CODES:   Obesity unspecified  INTERVENTION:  Continue Vital 1.5 @ 55 ml/hr via NGT Tube feeding regimen provides 1980 kcal (100% of needs), 89 grams of protein, and 1003 ml of H2O.   Recommend 120 ml free water flushes every 3 hours to provide an additional 960 ml daily. (smaller volumes more frequently recommended as tip of feeding tube is at ligament of treitz)  Recommend continuing tube feedings via NGT at discharge instead of TPN as pt is tolerating enteral nutrition. TF's provide greater benefits with fewer risks than TPN. NGT will need to be replaced after 4 weeks.    NUTRITION DIAGNOSIS:   Inadequate oral intake related to inability to eat as evidenced by NPO status.  Ongoing  GOAL:   Patient will meet greater than or equal to 90% of their needs  Being Met  MONITOR:   Labs, Weight trends, Skin, I & O's  REASON FOR ASSESSMENT:   Consult Enteral/tube feeding initiation and management  ASSESSMENT:   59 y.o. Female with h/o laryngeal cancer, s/p trach, currently on TPN for nutrition. All of this occurred during a Jan 16th admission. Patient apparently has been declining in general since time of discharge but is especially more confused in past couple of days. WBC today had increased to 28k from 9 days ago. Patient sent to ED for eval.  Pt receiving Vital 1.5 @ 50 ml/hr at time of visit and RN reports plans to increase to 55 ml/hr (now at 55 ml/hr). Pt denies any nausea or abdominal pain. RN reports ongoing loose stools, but pt states output is better. Her weight is now stable from yesterday.   Labs: low hemoglobin, elevated glucose (ranging 119 to 213 mg/dL), low calcium, low albumin  Diet Order:  Diet NPO time specified  Skin:  Wound (see comment) (stage II pressure ulcer on neck)  Last BM:  2/23 diarrhea per RN  Height:   Ht Readings from Last 1 Encounters:  09/23/15 5' 3" (1.6 m)    Weight:   Wt Readings from Last  1 Encounters:  10/06/15 190 lb 12.8 oz (86.546 kg)    Ideal Body Weight:  52.27 kg  BMI:  Body mass index is 33.81 kg/(m^2).  Estimated Nutritional Needs:   Kcal:  1900-2100  Protein:  85-100 grams  Fluid:  1.9-2 L/day  EDUCATION NEEDS:   No education needs identified at this time  Ashley Ramsey RD, LDN Inpatient Clinical Dietitian Pager: [phone] After Hours Pager: [phone]  

## 2015-10-06 NOTE — Progress Notes (Signed)
[facility] PROGRESS NOTE  Ashley Ramsey [id number] DOB: [birth year] DOA: 09/23/2015 PCP: Arnoldo Morale, MD   Subjective: Feels ok, no complaints, smiling/pleasant  HPI: Ashley Ramsey is a 60 year old female with a history of Type 2 DM (A1c 6.2), HTN was diagnosed with laryngeal cancer during a hospitalization at [facility] from 08/29/15 to 09/12/15. On CT neck she was found to have a laryngeal mass, CT abdomen revealed a left renal mass.  ENT was consulted-patient underwent a biopsy on 1/20 confirming malignancy. Hospital course was complicated by development of Massive upper  GI bleeding and associated blood loss anemia, s/p 5 units PRBC. GI was consulted, endoscopy revealed duodenal ulcer; she also had Gastroduodenal artery embolizatyion by IR.  She underwent tracheostomy on 09/04/2015; PEG tube could not be placed due to gastric and duodenal ulcers.  Seen by Oncology and Palliative medicine and Radiation Oncology then, Supposed to FU with ENT at [facility] now admitted with Sepsis, found to have Enterococcal and Candida albicans fungemia PICC removed, last culture was 2/15 -NGTD  PICC Placed 2/21  Assessment/Plan:  Enterococcal bacteremia and Candida albicans fungemia -Likely PICC related -PICC removed 09/25/13 -Clinically improving -Continue Ampicillin and fluconazole per ID recommendations for 1 month -Repeat blood culture - NGTD from 09/28/2015. -2-D echocardiogram complete, no evidence of vegetation -PICC Replaced 2/21 -FU with ID  Dysphagia -Patient has dysphagia, recommended to be NPO, has a 5 cm subglottic mass and tracheostomy -She was getting TNA via PICC, then PICC line was discontinued for line holiday. -The PEG tube was not placed during last hospital stay because of presence of duodenal ulcer on 09/03/2013 EGD.  -discussed with GI/Surgery and IR, no service ready to place PEG at this time. -GI initially recommended general surgery eval, general surgery  recommended GI to relook at the patient. -GI reevaluated her on 2/17, Dr.Elmahi had discussion with Dr. Loletha Carrow, he recommended interventional radiology. -IR recommended to hold off PEG tube for at least 6 weeks because of recent GI bleed and embolization procedure -Unfortunately if PEG tube is unfeasible this time she will be back on TPN. -now on Tube feeds via Panda tube too, remove Panda tube at time of discharge and continue TNA  Anemia  -Acute on chronic, multifactorial  -2 weeks ago found to have proximal stomach and duodenal ulcer, status post EGD on 09/04/2015, then required Gastroduodenal Artery coiling/embolization by IR on 09-07-15, required 5units PRBC then -Keep on PPI, Transfused 2 units this admission, had some dark stools earlier this admission, now resolved -monitor Hb periodically  Invasive laryngeal SCC, suspicious lymph nodes involvement per ENT -Seen by  ENT & oncology last month, underwent laryngoscopy/ biopsy on 1/20, about 5.4 subglottic mass. -Followed by Dr.Rosen and he recommended FU with ENT Dr. Fenton Malling at [facility] for Eval for Surgery -s/p tracheostomy placement by ENT on 09/05/2015 -Seen by Oncology Dr.Gorsuch and Rad Onc, and Palliative consult completed -Pt and family wants full aggressive scope of Rx -per Dr.Gorsuch, Patient's prognosis is extremely poor, poor candidate for chemotherapy, also most likely has renal cancer.  -No PEG tube as she has stomach and duodenal ulcers now -Was on TNA via PICC line -SLP eval for PMV and continued swallow eval.  -Seen by GI Dr. Collene Mares and [place] then, I called and d/w Dr. Benson Norway regarding J tube, he felt that if a J tube is needed then it would need to be done surgically, I doubt she would be considered for Surgical J tube in setting  of Enterococcal and Yeast bacteremia at this time. -Called and d/w Dr.Gorsuch 2/13, she recommended Hospice, unfortunately pt and family want full Aggressive scope of Rx. -Patient should follow  with [facility] for her laryngeal cancer.  Left renal mass: Highly suspicious for renal cell carcinoma.  -Case d/w Dr. Ree Kida last admission, recommended outpatient follow-up with him in the office for consideration of nephrectomy in the future  DM -SSI for now  Morbid obesity:  -nutrition via tube feeds now  Hypernatremia -improving, on d5W  DVT proph:  -SCDs, due to anemia, recent massive GI bleed  Code Status: FUll Code Family Communication: none at bedside, left message for daughter yesterday Disposition Plan: to be determined, CSW consulted for SNF   Objective: Filed Vitals:   10/06/15 0700 10/06/15 1200  BP: 131/74 127/71  Pulse: 92 94  Temp: 98.8 F (37.1 C) 98.8 F (37.1 C)  Resp: 21 20    Intake/Output Summary (Last 24 hours) at 10/06/15 1323 Last data filed at 10/06/15 1000  Gross per 24 hour  Intake 2126.33 ml  Output   2000 ml  Net 126.33 ml   Filed Weights   10/04/15 0431 10/05/15 0529 10/06/15 0401  Weight: 86.682 kg (191 lb 1.6 oz) 86.365 kg (190 lb 6.4 oz) 86.546 kg (190 lb 12.8 oz)    Exam:   General:  AAOx3, chronically ill appearing, no distress  HEENT: around trach some phlegm noted  Cardiovascular: S1S2/RRR  Respiratory: conducted upper airway sounds  Abdomen: soft, NT, BS present  Musculoskeletal: no edema c/c   Data Reviewed: Basic Metabolic Panel:  Recent Labs Lab 10/01/15 0248 10/03/15 0609 10/04/15 0603 10/05/15 0605 10/06/15 0515  NA 151* 148* 148* 147* 146*  K 3.1* 3.7 3.5 3.4* 3.4*  CL 115* 114* 111 109 106  CO2 22 24 27 28 27   GLUCOSE 136* 119* 180* 161* 213*  BUN 13 5* <5* <5* <5*  CREATININE 1.13* 1.06* 1.15* 1.10* 1.01*  CALCIUM 8.1* 8.0* 8.3* 8.1* 8.2*  PHOS  --   --  3.0 2.7 2.6   Liver Function Tests:  Recent Labs Lab 10/04/15 0603 10/05/15 0605 10/06/15 0515  ALBUMIN 2.1* 2.0* 1.9*   No results for input(s): LIPASE, AMYLASE in the last 168 hours. No results for input(s):  AMMONIA in the last 168 hours. CBC:  Recent Labs Lab 10/01/15 0248 10/03/15 1125 10/04/15 0421 10/05/15 0605 10/06/15 0515  WBC 18.6* 19.9* 17.7* 16.6* 16.8*  HGB 8.5* 9.0* 9.3* 8.3* 8.5*  HCT 27.4* 28.2* 30.7* 26.5* 27.9*  MCV 86.4 87.6 88.0 88.0 87.5  PLT 176 219 247 222 228   Cardiac Enzymes: No results for input(s): CKTOTAL, CKMB, CKMBINDEX, TROPONINI in the last 168 hours. BNP (last 3 results) No results for input(s): BNP in the last 8760 hours.  ProBNP (last 3 results) No results for input(s): PROBNP in the last 8760 hours.  CBG:  Recent Labs Lab 10/05/15 2002 10/06/15 0027 10/06/15 0400 10/06/15 0800 10/06/15 1202  GLUCAP 140* 144* 180* 211* 178*    Recent Results (from the past 240 hour(s))  Culture, blood (routine x 2)     Status: None   Collection Time: 09/28/15  3:20 PM  Result Value Ref Range Status   Specimen Description BLOOD RIGHT ARM  Final   Special Requests IN PEDIATRIC BOTTLE 3CC  Final   Culture NO GROWTH 5 DAYS  Final   Report Status 10/03/2015 FINAL  Final  Culture, blood (routine x 2)  Status: None   Collection Time: 09/28/15  3:37 PM  Result Value Ref Range Status   Specimen Description BLOOD RIGHT HAND  Final   Special Requests IN PEDIATRIC BOTTLE 3CC  Final   Culture NO GROWTH 5 DAYS  Final   Report Status 10/03/2015 FINAL  Final     Studies: Dg Chest Port 1 View  10/04/2015  CLINICAL DATA:  Evaluate central line placement EXAM: PORTABLE CHEST 1 VIEW COMPARISON:  09/25/2015 FINDINGS: Right upper extremity PICC. Tip placement is indeterminate due to underpenetration and artifact from EKG leads. The tip is the level of the right atrium, depth uncertain. Would retracted by 3-4 cm. New feeding tube with tip at least reaching the stomach. Tracheostomy tube is well seated. Improved inflation compared to prior. No pneumonia or edema. IMPRESSION: Right upper extremity PICC with tip at the right atrium, depth uncertain due to technical  factors. Suggest retraction by 3-4 cm and repeat radiograph. Electronically Signed   By: Monte Fantasia M.D.   On: 10/04/2015 14:47    Scheduled Meds: . alteplase  2 mg Intracatheter Once  . alteplase  2 mg Intracatheter Once  . ampicillin (OMNIPEN) IV  2 g Intravenous 6 times per day  . fluconazole (DIFLUCAN) IV  400 mg Intravenous Q24H  . insulin aspart  0-9 Units Subcutaneous 6 times per day  . pantoprazole (PROTONIX) IV  40 mg Intravenous Q12H   Continuous Infusions: . dextrose 5 % 1,000 mL with potassium chloride 40 mEq infusion 100 mL/hr at 10/06/15 0006  . feeding supplement (VITAL 1.5 CAL) 1,000 mL (10/06/15 1302)   Antibiotics Given (last 72 hours)    Date/Time Action Medication Dose Rate   10/03/15 1645 Given   ampicillin (OMNIPEN) 2 g in sodium chloride 0.9 % 50 mL IVPB 2 g 150 mL/hr   10/03/15 2056 Given   ampicillin (OMNIPEN) 2 g in sodium chloride 0.9 % 50 mL IVPB 2 g 150 mL/hr   10/03/15 2350 Given   ampicillin (OMNIPEN) 2 g in sodium chloride 0.9 % 50 mL IVPB 2 g 150 mL/hr   10/04/15 0800 Given   ampicillin (OMNIPEN) 2 g in sodium chloride 0.9 % 50 mL IVPB 2 g 150 mL/hr   10/04/15 1227 Given   ampicillin (OMNIPEN) 2 g in sodium chloride 0.9 % 50 mL IVPB 2 g 150 mL/hr   10/04/15 1718 Given   ampicillin (OMNIPEN) 2 g in sodium chloride 0.9 % 50 mL IVPB 2 g 150 mL/hr   10/04/15 1941 Given   ampicillin (OMNIPEN) 2 g in sodium chloride 0.9 % 50 mL IVPB 2 g 150 mL/hr   10/05/15 0022 Given   ampicillin (OMNIPEN) 2 g in sodium chloride 0.9 % 50 mL IVPB 2 g 150 mL/hr   10/05/15 0401 Given   ampicillin (OMNIPEN) 2 g in sodium chloride 0.9 % 50 mL IVPB 2 g 150 mL/hr   10/05/15 0751 Given   ampicillin (OMNIPEN) 2 g in sodium chloride 0.9 % 50 mL IVPB 2 g 150 mL/hr   10/05/15 1210 Given   ampicillin (OMNIPEN) 2 g in sodium chloride 0.9 % 50 mL IVPB 2 g 150 mL/hr   10/05/15 1718 Given   ampicillin (OMNIPEN) 2 g in sodium chloride 0.9 % 50 mL IVPB 2 g 150 mL/hr   10/05/15  1957 Given   ampicillin (OMNIPEN) 2 g in sodium chloride 0.9 % 50 mL IVPB 2 g 150 mL/hr   10/06/15 0006 Given   ampicillin (OMNIPEN) 2 g  in sodium chloride 0.9 % 50 mL IVPB 2 g 150 mL/hr   10/06/15 0355 Given   ampicillin (OMNIPEN) 2 g in sodium chloride 0.9 % 50 mL IVPB 2 g 150 mL/hr   10/06/15 0855 Given   ampicillin (OMNIPEN) 2 g in sodium chloride 0.9 % 50 mL IVPB 2 g 150 mL/hr   10/06/15 1303 Given   ampicillin (OMNIPEN) 2 g in sodium chloride 0.9 % 50 mL IVPB 2 g 150 mL/hr      Principal Problem:   Severe sepsis (HCC) Active Problems:   Type 2 diabetes mellitus without complication, without long-term current use of insulin (HCC)   AKI (acute kidney injury) ([place])   HCAP (healthcare-associated pneumonia)   DM2 (diabetes mellitus, type 2) ([place])   Pressure ulcer   Hypoxia   Dysphagia, pharyngeal phase   Duodenal ulcer hemorrhage    Time spent: 63min    Carilyn Woolston  [facility] Pager [phone]. If 7PM-7AM, please contact night-coverage at [email or web address], password [facility] 10/06/2015, 1:23 PM  LOS: 13 days

## 2015-10-06 NOTE — Trach Care Team (Signed)
[place] Progression Note   Patient Details Name: Ashley Ramsey MRN: [id number] DOB: 1956/03/03 Today's Date: 10/06/2015   Tracheostomy Assessment    Tracheostomy Shiley 6 mm Uncuffed (Active)  Status Secured 10/06/2015 12:00 PM  Site Assessment Clean;Dry 10/06/2015 12:00 PM  Site Care Cleansed;Protective barrier to skin 10/06/2015 12:00 PM  Inner Cannula Care Cleansed/dried 10/05/2015 12:00 PM  Ties Assessment Clean;Dry;Secure 10/06/2015 12:00 PM  Cuff pressure (cm) 0 cm 10/06/2015 12:00 PM  Emergency Equipment at bedside Yes 10/06/2015 12:00 PM     Care Needs     Respiratory Therapy Tracheostomy: Chronic trach O2 Device: Tracheostomy Collar FiO2 (%): 28 % SpO2: 100 %    Speech Language Pathology  SLP chart review complete: Patient not ready for SLP services (Copious secretions, MD wants to wait till Fri for PMSV) Patient may use Passy-Muir Speech Valve: During all waking hours (remove during sleep) PMSV Supervision: Intermittent Proceed with Objective Swallowing Evaluation: MBS Follow up Recommendations: Home health SLP SLP barriers to progression: Secretions   Physical Therapy      Occupational Therapy      Nutritional Patient's Current Diet: NPO    Case Management/Social Work      Provider [place] Team/Provider Recommendations [place] Team Members Present-  Doris Cheadle and Vincent Gros, RDP    Chronic trach on trach collar. None at present - Continue to follow .           [place] Bueche, Jaci Carrel 10/06/2015, 2:38 PM  Scribe for team

## 2015-10-07 ENCOUNTER — Telehealth: Payer: Self-pay | Admitting: Radiation Oncology

## 2015-10-07 ENCOUNTER — Inpatient Hospital Stay (HOSPITAL_COMMUNITY): Payer: Medicaid Other

## 2015-10-07 DIAGNOSIS — J387 Other diseases of larynx: Secondary | ICD-10-CM

## 2015-10-07 LAB — CBC
HCT: 27.4 % — ABNORMAL LOW (ref 36.0–46.0)
HEMOGLOBIN: 8.6 g/dL — AB (ref 12.0–15.0)
MCH: 27.7 pg (ref 26.0–34.0)
MCHC: 31.4 g/dL (ref 30.0–36.0)
MCV: 88.4 fL (ref 78.0–100.0)
Platelets: 214 10*3/uL (ref 150–400)
RBC: 3.1 MIL/uL — ABNORMAL LOW (ref 3.87–5.11)
RDW: 16.5 % — AB (ref 11.5–15.5)
WBC: 17.5 10*3/uL — AB (ref 4.0–10.5)

## 2015-10-07 LAB — RENAL FUNCTION PANEL
ALBUMIN: 2 g/dL — AB (ref 3.5–5.0)
ANION GAP: 11 (ref 5–15)
BUN: 5 mg/dL — AB (ref 6–20)
CALCIUM: 8.2 mg/dL — AB (ref 8.9–10.3)
CO2: 26 mmol/L (ref 22–32)
Chloride: 103 mmol/L (ref 101–111)
Creatinine, Ser: 1.03 mg/dL — ABNORMAL HIGH (ref 0.44–1.00)
GFR calc Af Amer: >60 mL/min (ref >60)
GFR, EST NON AFRICAN AMERICAN: 58 mL/min — AB (ref >60)
GLUCOSE: 168 mg/dL — AB (ref 65–99)
PHOSPHORUS: 2.9 mg/dL (ref 2.5–4.6)
Potassium: 4.2 mmol/L (ref 3.5–5.1)
SODIUM: 140 mmol/L (ref 135–145)

## 2015-10-07 LAB — GLUCOSE, CAPILLARY
GLUCOSE-CAPILLARY: 151 mg/dL — AB (ref 65–99)
GLUCOSE-CAPILLARY: 202 mg/dL — AB (ref 65–99)
Glucose-Capillary: 218 mg/dL — ABNORMAL HIGH (ref 65–99)
Glucose-Capillary: 189 mg/dL — ABNORMAL HIGH (ref 65–99)
Glucose-Capillary: 127 mg/dL — ABNORMAL HIGH (ref 65–99)

## 2015-10-07 MED ORDER — IOHEXOL 300 MG/ML  SOLN
50.0000 mL | Freq: Once | INTRAMUSCULAR | Status: AC | PRN
  Administered 2015-10-07: 20 mL

## 2015-10-07 MED ORDER — PANTOPRAZOLE SODIUM 40 MG IV SOLR
40.0000 mg | Freq: Every day | INTRAVENOUS | Status: DC
  Administered 2015-10-08 – 2015-10-11 (×4): 40 mg via INTRAVENOUS
  Filled 2015-10-07 (×4): qty 40

## 2015-10-07 MED ORDER — LIDOCAINE VISCOUS 2 % MT SOLN
OROMUCOSAL | Status: AC
  Administered 2015-10-07: 5 mL via NASAL
  Filled 2015-10-07: qty 15

## 2015-10-07 NOTE — Hospital Discharge Follow-Up (Signed)
[place] [facility] [place]:  This Case Manager spoke with Olga Coaster, RN CM who indicated current plan is for patient to return home with family and [facility] services with [place]. Call placed to patient's daughter and caregiver, Bree, and hospital follow-up appointment scheduled for 10/13/15 at 1030 with Dr. Jarold Song. AVS updated. Will continue to follow patient's clinical progress closely.

## 2015-10-07 NOTE — Telephone Encounter (Signed)
LM for Ms. Toney Rakes to call me back regarding Ms. Cogswell's status with [place].

## 2015-10-07 NOTE — Evaluation (Signed)
Physical Therapy Evaluation Patient Details Name: Ashley Ramsey MRN: [id number] DOB: 1956-08-10 Today's Date: 10/07/2015   History of Present Illness  60 yo female admitted with AMS sepsis  L LLL PNA recent admission 1/22 for trach/ peg with reveal of largyneal mass and renal mass PMH: dysphagia, anemia DM morbid obesity  Clinical Impression  Patient demonstrates deficits in functional mobility as indicated below. Will need continued skilled PT to address deficits and maximize function. Will see as indicated and progress as tolerated.    Follow Up Recommendations SNF;Supervision/Assistance - 24 hour    Equipment Recommendations  Other (comment) (rollator)    Recommendations for Other Services       Precautions / Restrictions Precautions Precautions: Fall Precaution Comments: trach peg Restrictions Weight Bearing Restrictions: No      Mobility  Bed Mobility Overal bed mobility: Modified Independent  increased time to perform, increased effort              Transfers Overall transfer level: Needs assistance   Transfers: Sit to/from Stand Sit to Stand: Supervision Stand pivot transfers: Min assist       General transfer comment: required assist during pivotal steps for safety with mobility, poor awareness of environment and lines.   Ambulation/Gait Ambulation/Gait assistance: Min guard Ambulation Distance (Feet): 12 Feet Assistive device: 1 person hand held assist          Stairs            Wheelchair Mobility    Modified Rankin (Stroke Patients Only)       Balance     Sitting balance-Leahy Scale: Good     Standing balance support: Single extremity supported Standing balance-Leahy Scale: Fair Standing balance comment: needed counter to provide support during standing activities                             Pertinent Vitals/Pain Pain Assessment: No/denies pain    Home Living Family/patient expects to be discharged to::  Skilled nursing facility Living Arrangements: Other relatives;Children (daughter x2) Available Help at Discharge: Family;Available 24 hours/day Type of Home: House Home Access: Stairs to enter Entrance Stairs-Rails: None Entrance Stairs-Number of Steps: 3 Home Layout: One level Home Equipment: None Additional Comments: daughter Emmaline Kluver is part of POC and works full time per patient. Pt report other daughter is also home 24/7    Prior Function Level of Independence: Independent               Hand Dominance   Dominant Hand: Right    Extremity/Trunk Assessment   Upper Extremity Assessment: Overall WFL for tasks assessed           Lower Extremity Assessment: Overall WFL for tasks assessed (limited ROM secondary to increased body habitus)      Cervical / Trunk Assessment: Normal  Communication   Communication: Tracheostomy  Cognition Arousal/Alertness: Awake/alert Behavior During Therapy: WFL for tasks assessed/performed Overall Cognitive Status: Within Functional Limits for tasks assessed                      General Comments General comments (skin integrity, edema, etc.): incontinent of stool, break down on buttock R gluteal fold. Required assist for hygiene and pericare. Limited activity tolerance    Exercises        Assessment/Plan    PT Assessment Patient needs continued PT services  PT Diagnosis Generalized weakness   PT Problem List Decreased activity tolerance;Decreased balance;Decreased  mobility;Decreased knowledge of use of DME;Decreased safety awareness;Decreased knowledge of precautions  PT Treatment Interventions DME instruction;Gait training;Therapeutic activities;Functional mobility training;Stair training;Therapeutic exercise;Balance training;Patient/family education   PT Goals (Current goals can be found in the Care Plan section) Acute Rehab PT Goals Patient Stated Goal: to go home PT Goal Formulation: With patient Time For Goal  Achievement: 10/21/15 Potential to Achieve Goals: Good    Frequency Min 3X/week   Barriers to discharge        Co-evaluation               End of Session Equipment Utilized During Treatment: Oxygen Activity Tolerance: Patient tolerated treatment well Patient left: in chair;with call bell/phone within reach;with chair alarm set;with family/visitor present Nurse Communication: Mobility status, pulled out NG tube         Time: 1355-1410 PT Time Calculation (min) (ACUTE ONLY): 15 min   Charges:   PT Evaluation $PT Eval Moderate Complexity: 1 Procedure     PT G CodesDuncan Dull 10/15/15, 4:46 PM Alben Deeds, [place] DPT  [phone]

## 2015-10-07 NOTE — Evaluation (Signed)
Occupational Therapy Evaluation Patient Details Name: Ashley Ramsey MRN: [id number] DOB: 1956/04/17 Today's Date: 10/07/2015    History of Present Illness 60 yo female admitted with AMS sepsis  L LLL PNA recent admission 1/22 for trach/ peg with reveal of largyneal mass and renal mass PMH: dysphagia, anemia DM morbid obesity   Clinical Impression   Family meeting scheduled for 11 am and family has not arrived at this time. Question family level of (A) at home and ability to (A) patient at current level. Pt with incontinence of bowel and lack of awareness. Pt with break down on buttock R gluteal fold. REcommend SNf at this time. Pt reports now "go home" when asked about d/c planning.      Follow Up Recommendations  SNF;Supervision/Assistance - 24 hour    Equipment Recommendations  3 in 1 bedside comode    Recommendations for Other Services       Precautions / Restrictions Precautions Precautions: Fall Precaution Comments: trach peg Restrictions Weight Bearing Restrictions: No      Mobility Bed Mobility Overal bed mobility: Modified Independent                Transfers Overall transfer level: Needs assistance   Transfers: Sit to/from Stand Sit to Stand: Supervision         General transfer comment: requires hand rail    Balance Overall balance assessment: Needs assistance         Standing balance support: Single extremity supported;During functional activity Standing balance-Leahy Scale: Fair                              ADL Overall ADL's : Needs assistance/impaired Eating/Feeding: NPO   Grooming: Wash/dry face;Minimal assistance;Sitting Grooming Details (indicate cue type and reason): spitting into trash can     Lower Body Bathing: Maximal assistance;Sit to/from stand Lower Body Bathing Details (indicate cue type and reason): peri care max (A) and verbalized pain with wiping. pt with a gentle pad / dab to help clean patient but  without further friction to wound. noted blood on wash cloth from skin Upper Body Dressing : Moderate assistance;Standing Upper Body Dressing Details (indicate cue type and reason): (A) due to all lines / leads     Toilet Transfer: Min guard;Stand-pivot             General ADL Comments: pt noted to have incontinence of bowel without awareness. pt with soiled gown and lack of awareness. pt will require 24/ 7 (A) for care due to awarness and risk for skin break down. PT is able to reposition with verba cues     Vision     Perception     Praxis      Pertinent Vitals/Pain Pain Assessment: Faces Faces Pain Scale: Hurts whole lot Pain Location: buttock with hygiene Pain Descriptors / Indicators: Discomfort Pain Intervention(s): Monitored during session;Repositioned;Other (comment) (barrier cream applied, wound noted)     Hand Dominance Right   Extremity/Trunk Assessment Upper Extremity Assessment Upper Extremity Assessment: Overall WFL for tasks assessed   Lower Extremity Assessment Lower Extremity Assessment: Defer to PT evaluation   Cervical / Trunk Assessment Cervical / Trunk Assessment: Normal   Communication Communication Communication: Tracheostomy   Cognition Arousal/Alertness: Awake/alert Behavior During Therapy: WFL for tasks assessed/performed Overall Cognitive Status: Within Functional Limits for tasks assessed  General Comments       Exercises       Shoulder Instructions      Home Living Family/patient expects to be discharged to:: Skilled nursing facility Living Arrangements: Other relatives;Children (daughter x2) Available Help at Discharge: Family;Available 24 hours/day Type of Home: House Home Access: Stairs to enter [employer] of Steps: 3 Entrance Stairs-Rails: None Home Layout: One level     Bathroom Shower/Tub: Occupational psychologist: Standard     Home Equipment: None   Additional  Comments: daughter Emmaline Kluver is part of POC and works full time per patient. Pt report other daughter is also home 24/7      Prior Functioning/Environment Level of Independence: Independent             OT Diagnosis: Generalized weakness;Acute pain   OT Problem List: Decreased strength;Decreased activity tolerance;Impaired balance (sitting and/or standing);Decreased cognition;Decreased safety awareness;Decreased knowledge of use of DME or AE;Decreased knowledge of precautions;Obesity;Pain   OT Treatment/Interventions: Self-care/ADL training;Therapeutic exercise;Energy conservation;DME and/or AE instruction;Therapeutic activities;Patient/family education;Balance training    OT Goals(Current goals can be found in the care plan section) Acute Rehab OT Goals Patient Stated Goal: to get better OT Goal Formulation: With patient Time For Goal Achievement: 10/21/15 Potential to Achieve Goals: Good  OT Frequency: Min 2X/week   Barriers to D/C: Other (comment) (question family (A) level upon dc)          Co-evaluation              End of Session Equipment Utilized During Treatment: Oxygen Nurse Communication: Mobility status;Precautions  Activity Tolerance: Patient tolerated treatment well Patient left: in bed;with call bell/phone within reach;with bed alarm set   Time: 1133-1145 OT Time Calculation (min): 12 min Charges:  OT General Charges $OT Visit: 1 Procedure OT Evaluation $OT Eval Moderate Complexity: 1 Procedure G-Codes:    Parke Poisson B 10-09-15, 11:57 AM  Jeri Modena   OTR/L PagerIP:[phone] Office: [phone] .

## 2015-10-07 NOTE — Progress Notes (Signed)
Patient is to return home with Panda tube; Teaching is to be initiated with the family members/ caregivers for tube feedings. CM talked to daughter April Jones (Daughter)  [phone]; family members were to arrive at the hospital around 11 am but no one showed up. VM left with April regarding the importance to education for tube feedings once discharged home. [place] services are arranged with [facility], TF orderedAneta Mins [phone]

## 2015-10-07 NOTE — Progress Notes (Signed)
CM talked to patient yesterday for DCP; Patient is active with [facility] as prior to admission and lives with her daughter. Patient is agreeable to SNF placement. TCT her daughter April, they will be in today after 11 am to discuss DCP; ( April wants to take her home). Mindi Slicker [facility] [phone]

## 2015-10-07 NOTE — Progress Notes (Signed)
[facility] PROGRESS NOTE  Ashley Ramsey [id number] DOB: [birth year] DOA: 09/23/2015 PCP: Arnoldo Morale, MD   Subjective: Feels ok, no complaints, smiling/pleasant  HPI: Ashley Ramsey is a 60 year old female with a history of Type 2 DM (A1c 6.2), HTN was diagnosed with laryngeal cancer during a hospitalization at [facility] from 08/29/15 to 09/12/15. On CT neck she was found to have a laryngeal mass, CT abdomen revealed a left renal mass.  ENT was consulted-patient underwent a biopsy on 1/20 confirming malignancy. Hospital course was complicated by development of Massive upper  GI bleeding and associated blood loss anemia, s/p 5 units PRBC. GI was consulted, endoscopy revealed duodenal ulcer; she also had Gastroduodenal artery embolizatyion by IR.  She underwent tracheostomy on 09/04/2015; PEG tube could not be placed due to gastric and duodenal ulcers.  Seen by Oncology and Palliative medicine and Radiation Oncology then, Supposed to FU with ENT at [facility] now admitted with Sepsis, found to have Enterococcal and Candida albicans fungemia PICC removed, last culture was 2/15 -NGTD  PICC Placed 2/21 On nutrition via Panda  Assessment/Plan:  Enterococcal bacteremia and Candida albicans fungemia -Likely PICC related -PICC removed 09/25/13 -Clinically improved -Continue Ampicillin and fluconazole per ID recommendations for 1 month -Repeat blood culture - NGTD from 09/28/2015. -2-D echocardiogram complete, no evidence of vegetation -PICC Replaced 2/21 -FU with ID -remains stable and afebrile now  Dysphagia -Patient has dysphagia, recommended to be NPO, has a 5 cm subglottic mass and tracheostomy -She was getting TNA via PICC, then PICC line was discontinued for line holiday. -The PEG tube was not placed during last hospital stay because of presence of duodenal ulcer on 09/03/2013 EGD.  -GI initially recommended general surgery eval, general surgery recommended GI to relook at the  patient. -GI reevaluated her on 2/17, Dr.Elmahi had discussion with Dr. Loletha Carrow, he recommended interventional radiology. -IR recommended to hold off PEG tube for at least 6 weeks because of recent GI bleed and embolization procedure,  -now on Tube feeds via Panda tube, panda pulled out today, will ask radiology to replace. -(2/24) I called and d/w IR Dr.Art Hoss, regarding PEG tube down the road (mid march, 6weeks from embolization) , he said she would need an EGD before this could be considered and PEG via endoscopy would be the right way to proceed rather than blind by IR., Called and d/w Gi Dr.Armbruster again, they will eval the patient and see if EGD/PEG would be an option at this time  Anemia  -Acute on chronic, multifactorial  -2 weeks ago found to have proximal stomach and duodenal ulcer, status post EGD on 09/04/2015, then required Gastroduodenal Artery coiling/embolization by IR on 09-07-15, required 5units PRBC then -Keep on PPI, Transfused 2 units this admission, had some dark stools earlier this admission, now resolved -monitor Hb periodically  Invasive laryngeal SCC, suspicious lymph nodes involvement per ENT -Seen by  ENT & oncology last month, underwent laryngoscopy/ biopsy on 1/20, about 5.4 subglottic mass. -Followed by Dr.Rosen and he recommended FU with ENT Dr. Fenton Malling at [facility] for Eval for Surgery -s/p tracheostomy placement by ENT on 09/05/2015 -Seen by Oncology Dr.Gorsuch and Rad Onc, and Palliative consult completed -Pt and family wants full aggressive scope of Rx -per Dr.Gorsuch, Patient's prognosis is poor, poor candidate for chemotherapy, also most likely has renal cancer.  -No PEG tube placed last admission due to duodenal ulcers -Was on TNA via PICC line -SLP eval for PMV and continued swallow  eval.  -Called and d/w Dr.Gorsuch 2/13, she recommended Hospice, unfortunately pt and family want full Aggressive scope of Rx. -Patient should follow with [facility] for her laryngeal cancer.  Left renal mass: Highly suspicious for renal cell carcinoma.  -Case d/w Dr. Ree Kida last admission, recommended outpatient follow-up with him in the office for consideration of nephrectomy in the future  DM -SSI for now  Morbid obesity:  -nutrition via tube feeds now  Hypernatremia -improving, on d5W -stop d5w and add free water  DVT proph:  -SCDs, due to anemia, recent massive GI bleed  Code Status: FUll Code Family Communication: none at bedside, left message for daughter today Disposition Plan: home pending nutrition plans  Objective: Filed Vitals:   10/07/15 0756 10/07/15 1229  BP:    Pulse: 108 96  Temp:    Resp: 20 20    Intake/Output Summary (Last 24 hours) at 10/07/15 1419 Last data filed at 10/07/15 1405  Gross per 24 hour  Intake 2431.5 ml  Output   1151 ml  Net 1280.5 ml   Filed Weights   10/05/15 0529 10/06/15 0401 10/07/15 0446  Weight: 86.365 kg (190 lb 6.4 oz) 86.546 kg (190 lb 12.8 oz) 87.317 kg (192 lb 8 oz)    Exam:   General:  AAOx3, chronically ill appearing, no distress  HEENT: around trach some phlegm noted around this  Cardiovascular: S1S2/RRR  Respiratory: conducted upper airway sounds  Abdomen: soft, NT, BS present  Musculoskeletal: no edema c/c   Data Reviewed: Basic Metabolic Panel:  Recent Labs Lab 10/03/15 0609 10/04/15 0603 10/05/15 0605 10/06/15 0515 10/07/15 0420  NA 148* 148* 147* 146* 140  K 3.7 3.5 3.4* 3.4* 4.2  CL 114* 111 109 106 103  CO2 24 27 28 27 26   GLUCOSE 119* 180* 161* 213* 168*  BUN 5* <5* <5* <5* 5*  CREATININE 1.06* 1.15* 1.10* 1.01* 1.03*  CALCIUM 8.0* 8.3* 8.1* 8.2* 8.2*  PHOS  --  3.0 2.7 2.6 2.9   Liver Function Tests:  Recent Labs Lab 10/04/15 0603 10/05/15 0605 10/06/15 0515 10/07/15 0420  ALBUMIN 2.1* 2.0* 1.9* 2.0*   No results for input(s): LIPASE, AMYLASE in the last 168 hours. No results for input(s): AMMONIA in the last  168 hours. CBC:  Recent Labs Lab 10/03/15 1125 10/04/15 0421 10/05/15 0605 10/06/15 0515 10/07/15 0430  WBC 19.9* 17.7* 16.6* 16.8* 17.5*  HGB 9.0* 9.3* 8.3* 8.5* 8.6*  HCT 28.2* 30.7* 26.5* 27.9* 27.4*  MCV 87.6 88.0 88.0 87.5 88.4  PLT 219 247 222 228 214   Cardiac Enzymes: No results for input(s): CKTOTAL, CKMB, CKMBINDEX, TROPONINI in the last 168 hours. BNP (last 3 results) No results for input(s): BNP in the last 8760 hours.  ProBNP (last 3 results) No results for input(s): PROBNP in the last 8760 hours.  CBG:  Recent Labs Lab 10/06/15 2002 10/06/15 2352 10/07/15 0442 10/07/15 0856 10/07/15 1133  GLUCAP 138* 180* 151* 218* 189*    Recent Results (from the past 240 hour(s))  Culture, blood (routine x 2)     Status: None   Collection Time: 09/28/15  3:20 PM  Result Value Ref Range Status   Specimen Description BLOOD RIGHT ARM  Final   Special Requests IN PEDIATRIC BOTTLE 3CC  Final   Culture NO GROWTH 5 DAYS  Final   Report Status 10/03/2015 FINAL  Final  Culture, blood (routine x 2)     Status: None   Collection Time: 09/28/15  3:37 PM  Result Value Ref Range Status   Specimen Description BLOOD RIGHT HAND  Final   Special Requests IN PEDIATRIC BOTTLE 3CC  Final   Culture NO GROWTH 5 DAYS  Final   Report Status 10/03/2015 FINAL  Final     Studies: No results found.  Scheduled Meds: . alteplase  2 mg Intracatheter Once  . alteplase  2 mg Intracatheter Once  . ampicillin (OMNIPEN) IV  2 g Intravenous 6 times per day  . fluconazole (DIFLUCAN) IV  400 mg Intravenous Q24H  . free water  300 mL Per Tube QID  . insulin aspart  0-9 Units Subcutaneous 6 times per day  . [START ON 10/08/2015] pantoprazole (PROTONIX) IV  40 mg Intravenous Q24H   Continuous Infusions: . feeding supplement (VITAL 1.5 CAL) 1,000 mL (10/06/15 1508)   Antibiotics Given (last 72 hours)    Date/Time Action Medication Dose Rate   10/04/15 1718 Given   ampicillin (OMNIPEN) 2 g in  sodium chloride 0.9 % 50 mL IVPB 2 g 150 mL/hr   10/04/15 1941 Given   ampicillin (OMNIPEN) 2 g in sodium chloride 0.9 % 50 mL IVPB 2 g 150 mL/hr   10/05/15 0022 Given   ampicillin (OMNIPEN) 2 g in sodium chloride 0.9 % 50 mL IVPB 2 g 150 mL/hr   10/05/15 0401 Given   ampicillin (OMNIPEN) 2 g in sodium chloride 0.9 % 50 mL IVPB 2 g 150 mL/hr   10/05/15 0751 Given   ampicillin (OMNIPEN) 2 g in sodium chloride 0.9 % 50 mL IVPB 2 g 150 mL/hr   10/05/15 1210 Given   ampicillin (OMNIPEN) 2 g in sodium chloride 0.9 % 50 mL IVPB 2 g 150 mL/hr   10/05/15 1718 Given   ampicillin (OMNIPEN) 2 g in sodium chloride 0.9 % 50 mL IVPB 2 g 150 mL/hr   10/05/15 1957 Given   ampicillin (OMNIPEN) 2 g in sodium chloride 0.9 % 50 mL IVPB 2 g 150 mL/hr   10/06/15 0006 Given   ampicillin (OMNIPEN) 2 g in sodium chloride 0.9 % 50 mL IVPB 2 g 150 mL/hr   10/06/15 0355 Given   ampicillin (OMNIPEN) 2 g in sodium chloride 0.9 % 50 mL IVPB 2 g 150 mL/hr   10/06/15 0855 Given   ampicillin (OMNIPEN) 2 g in sodium chloride 0.9 % 50 mL IVPB 2 g 150 mL/hr   10/06/15 1303 Given   ampicillin (OMNIPEN) 2 g in sodium chloride 0.9 % 50 mL IVPB 2 g 150 mL/hr   10/06/15 1508 Given   ampicillin (OMNIPEN) 2 g in sodium chloride 0.9 % 50 mL IVPB 2 g 150 mL/hr   10/06/15 2011 Given   ampicillin (OMNIPEN) 2 g in sodium chloride 0.9 % 50 mL IVPB 2 g 150 mL/hr   10/07/15 0022 Given   ampicillin (OMNIPEN) 2 g in sodium chloride 0.9 % 50 mL IVPB 2 g 150 mL/hr   10/07/15 0504 Given   ampicillin (OMNIPEN) 2 g in sodium chloride 0.9 % 50 mL IVPB 2 g 150 mL/hr   10/07/15 0900 Given   ampicillin (OMNIPEN) 2 g in sodium chloride 0.9 % 50 mL IVPB 2 g 150 mL/hr      Principal Problem:   Severe sepsis (HCC) Active Problems:   Type 2 diabetes mellitus without complication, without long-term current use of insulin (HCC)   AKI (acute kidney injury) ([place])   HCAP (healthcare-associated pneumonia)   DM2 (diabetes mellitus, type 2) ([place])  Pressure ulcer   Hypoxia   Dysphagia, pharyngeal phase   Duodenal ulcer hemorrhage    Time spent: 53min    Ashley Ramsey  [facility] Pager [phone]. If 7PM-7AM, please contact night-coverage at [email or web address], password [facility] 10/07/2015, 2:19 PM  LOS: 14 days

## 2015-10-07 NOTE — Consult Note (Signed)
Daily Rounding Note  10/07/2015, 2:40 PM  LOS: 14 days   SUBJECTIVE:       Please see comprehensive consult note regarding same issue of placing a PEG dated 09/30/15. Dr. Barbie Banner of IR recommended holding off on placement of gastrostomy tube for 6 weeks.  Hence hospitalist is again seeking GI help in possibly placing a PEG in this lady whose overall prognosis is not good. She has a malignant laryngeal mass as well as a left renal mass which is probably neoplastic as well but hasn't been biopsied. She had massive (5 unit PRBC) GI bleed and underwent upper endoscopy by Dr. Havery Moros (covering for Dr. Carol Ada) 09/04/15. At the endoscopy large duodenal ulcer with bleeding stigmata was treated with Hemoclip placement.  She then went to gastroduodenal artery embolization by interventional radiology. She is status post tracheostomy 09/04/15. She is currently undergoing treatment for bacteremia and fungal anemia and has documented pharyngeal dysphagia.  Family is pushing for full aggressive care which includes the placement of a gastrostomy tube.  Tolerating of Vital 1.5 at 55/ml/hr panda tube feeds  OBJECTIVE:         Vital signs in last 24 hours:    Temp:  [97.6 F (36.4 C)-99.1 F (37.3 C)] 98.6 F (37 C) (02/24 1428) Pulse Rate:  [93-108] 93 (02/24 1428) Resp:  [18-20] 18 (02/24 1428) BP: (121-160)/(68-98) 134/98 mmHg (02/24 1428) SpO2:  [98 %-100 %] 100 % (02/24 1428) FiO2 (%):  [5 %-28 %] 5 % (02/24 1229) Weight:  [87.317 kg (192 lb 8 oz)] 87.317 kg (192 lb 8 oz) (02/24 0446) Last BM Date: 10/07/15 Filed Weights   10/05/15 0529 10/06/15 0401 10/07/15 0446  Weight: 86.365 kg (190 lb 6.4 oz) 86.546 kg (190 lb 12.8 oz) 87.317 kg (192 lb 8 oz)   General: trach in place, resting in bed, responding appropriately to questions  Heart: regular Chest: some scattered coarse BS anteriorly Abdomen: soft, protuberant, no scars Neuro/Psych:   Appropriate, AAOx3, no gross deficits  Intake/Output from previous day: 02/23 0701 - 02/24 0700 In: 6131.5 [I.V.:3402.5; NG/GT:2179; IV Piggyback:550] Out: [id number] [Urine:1450]  Intake/Output this shift: Total I/O In: 0  Out: 201 [Urine:200; Stool:1]  Lab Results:  Recent Labs  10/05/15 0605 10/06/15 0515 10/07/15 0430  WBC 16.6* 16.8* 17.5*  HGB 8.3* 8.5* 8.6*  HCT 26.5* 27.9* 27.4*  PLT 222 228 214   BMET  Recent Labs  10/05/15 0605 10/06/15 0515 10/07/15 0420  NA 147* 146* 140  K 3.4* 3.4* 4.2  CL 109 106 103  CO2 28 27 26   GLUCOSE 161* 213* 168*  BUN <5* <5* 5*  CREATININE 1.10* 1.01* 1.03*  CALCIUM 8.1* 8.2* 8.2*   LFT  Recent Labs  10/05/15 0605 10/06/15 0515 10/07/15 0420  ALBUMIN 2.0* 1.9* 2.0*   PT/INR No results for input(s): LABPROT, INR in the last 72 hours. Hepatitis Panel No results for input(s): HEPBSAG, HCVAB, HEPAIGM, HEPBIGM in the last 72 hours.  Studies/Results: No results found.   Scheduled Meds: . alteplase  2 mg Intracatheter Once  . alteplase  2 mg Intracatheter Once  . ampicillin (OMNIPEN) IV  2 g Intravenous 6 times per day  . fluconazole (DIFLUCAN) IV  400 mg Intravenous Q24H  . free water  300 mL Per Tube QID  . insulin aspart  0-9 Units Subcutaneous 6 times per day  . lidocaine      . [START ON 10/08/2015] pantoprazole (PROTONIX)  IV  40 mg Intravenous Daily   Continuous Infusions: . feeding supplement (VITAL 1.5 CAL) 1,000 mL (10/06/15 1508)   PRN Meds:.acetaminophen, acetaminophen, albuterol, HYDROcodone-acetaminophen, HYDROmorphone (DILAUDID) injection, iohexol, metoprolol, sodium chloride flush, sodium chloride flush   ASSESMENT:   *  Laryngeal cancer with associated dysphagia.  If she can get healthy enough the plan is for her to follow-up with ENT service at [facility].  Apparently palliative XRT is all that is possible    *  Status post tracheostomy.  *   Large, bleeding duodenal ulcer. Status post  09/04/15 EGD with Endo clipping followed by embolization of gastroduodenal artery.  *  Renal mass, not yet biopsied but felt to likely be malignant.  *  Anemia. Transfused multiple PRBCs during her 08/2015 admission and 2 PRBCs at the beginning of this current admission.  *  Enterococcal and Candida albicans positive blood cultures 09/25/15.   PLAN   *  Per Dr Havery Moros.    * as not ever assayed, ordered serum H pylori Ab with AM labs.     Ashley Ramsey  10/07/2015, 2:40 PM Pager: [phone]    Attending Addendum: I have taken an interval history, reviewed the chart, and examined the patient. Patient is in a difficult situation for nutritional support given her laryngeal mass, but is not a good candidate per IR from their perspective given her recent ulcer and they have declined this intervention. I have several concerns about endoscopic placement of a PEG in this patient after evaluating her. Technically it may be difficult to traverse the mass with the feeding tube as it was not an easy esophageal intubation during her endoscopy which I performed, and there are risks of bleeding of the mass associated with trying to do this. She has also had a recent line infection and her WBC remains elevated, which can confound things if she were to have issues with the PEG following its placement. She has an obese abdomen which may make transillumination of the abdominal wall difficult, and although I wouldn't expect this to be prohibitive it could be. I otherwise discussed the risks / benefits of PEG with the patient at length to include bowel perforation, bleeding, infection, etc. Her care is palliative at this point. The safest option regarding her nutritional at this time is to continue her NG feeding tube that was placed by IR today, as it is functional and the patient appears to be tolerating it, at least for now. I would recommend she continue with this modality of tube feeds, and the patient agreed  with this for now. If she has issues with the NG feeding tube moving forward we can reassess her or consider a surgical consult.    Newtown Cellar, MD [facility] Pager [phone]

## 2015-10-08 LAB — GLUCOSE, CAPILLARY
GLUCOSE-CAPILLARY: 150 mg/dL — AB (ref 65–99)
GLUCOSE-CAPILLARY: 217 mg/dL — AB (ref 65–99)
GLUCOSE-CAPILLARY: 235 mg/dL — AB (ref 65–99)
Glucose-Capillary: 161 mg/dL — ABNORMAL HIGH (ref 65–99)
Glucose-Capillary: 235 mg/dL — ABNORMAL HIGH (ref 65–99)

## 2015-10-08 LAB — RENAL FUNCTION PANEL
ALBUMIN: 1.8 g/dL — AB (ref 3.5–5.0)
ANION GAP: 10 (ref 5–15)
BUN: 8 mg/dL (ref 6–20)
CO2: 29 mmol/L (ref 22–32)
Calcium: 8 mg/dL — ABNORMAL LOW (ref 8.9–10.3)
Chloride: 104 mmol/L (ref 101–111)
Creatinine, Ser: 0.87 mg/dL (ref 0.44–1.00)
GFR calc Af Amer: >60 mL/min (ref >60)
Glucose, Bld: 207 mg/dL — ABNORMAL HIGH (ref 65–99)
PHOSPHORUS: 3.3 mg/dL (ref 2.5–4.6)
POTASSIUM: 4.1 mmol/L (ref 3.5–5.1)
Sodium: 143 mmol/L (ref 135–145)

## 2015-10-08 LAB — CBC
HEMATOCRIT: 25.2 % — AB (ref 36.0–46.0)
HEMOGLOBIN: 7.6 g/dL — AB (ref 12.0–15.0)
MCH: 26.3 pg (ref 26.0–34.0)
MCHC: 30.2 g/dL (ref 30.0–36.0)
MCV: 87.2 fL (ref 78.0–100.0)
Platelets: 204 10*3/uL (ref 150–400)
RBC: 2.89 MIL/uL — ABNORMAL LOW (ref 3.87–5.11)
RDW: 16.5 % — AB (ref 11.5–15.5)
WBC: 14.4 10*3/uL — AB (ref 4.0–10.5)

## 2015-10-08 LAB — PREPARE RBC (CROSSMATCH)

## 2015-10-08 MED ORDER — SODIUM CHLORIDE 0.9 % IV SOLN
Freq: Once | INTRAVENOUS | Status: AC
  Administered 2015-10-08: 16:00:00 via INTRAVENOUS

## 2015-10-08 NOTE — Progress Notes (Addendum)
[facility] PROGRESS NOTE  Ashley Ramsey [id number] DOB: 12/01/1955 DOA: 09/23/2015 PCP: Arnoldo Morale, MD   Subjective: Feels ok, no complaints, freq stools on tube feeds, Panda replaced 2/23  HPI: Ashley Ramsey is a 60 year old female with a history of Type 2 DM (A1c 6.2), HTN was diagnosed with laryngeal cancer during a hospitalization at [facility] from 08/29/15 to 09/12/15. On CT neck she was found to have a laryngeal mass, CT abdomen revealed a left renal mass.  ENT was consulted-patient underwent a biopsy on 1/20 confirming malignancy. Hospital course was complicated by development of Massive upper  GI bleeding and associated blood loss anemia, s/p 5 units PRBC. GI was consulted, endoscopy revealed duodenal ulcer; she also had Gastroduodenal artery embolizatyion by IR.  She underwent tracheostomy on 09/04/2015; PEG tube could not be placed due to gastric and duodenal ulcers.  Seen by Oncology and Palliative medicine and Radiation Oncology then, Supposed to FU with ENT at [facility] now admitted with Sepsis, found to have Enterococcal and Candida albicans fungemia PICC removed, last culture was 2/15 -NGTD  PICC Placed 2/21 On nutrition via Panda  Assessment/Plan:  Enterococcal bacteremia and Candida albicans fungemia -Likely PICC related -PICC removed 09/25/13 -Clinically improved -Continue Ampicillin and fluconazole per ID recommendations for 1 month -Repeat blood culture - NGTD from 09/28/2015. -2-D echocardiogram complete, no evidence of vegetation -PICC Replaced 2/21 -FU with ID -remains stable and afebrile now  Dysphagia -Patient has dysphagia, recommended to be NPO, has a 5 cm subglottic mass and tracheostomy -She was getting TNA via PICC, then PICC line was discontinued for line holiday. -The PEG tube was not placed during last hospital stay because of presence of duodenal ulcer on 09/03/2013 EGD.  -GI initially recommended general surgery eval, general surgery  recommended GI to relook at the patient. -GI reevaluated her on 2/17, Dr.Elmahi had discussion with Dr. Loletha Carrow, he recommended interventional radiology. -IR recommended to hold off PEG tube for at least 6 weeks because of recent GI bleed and embolization procedure,  -now on Tube feeds via Panda tube, panda pulled out today, will ask radiology to replace. -(2/24) I called and d/w IR Dr.Art Hoss, regarding PEG tube down the road (mid march, 6weeks from embolization) , he said she would need an EGD before this could be considered and PEG via endoscopy would be the right way to proceed rather than blind by IR, d/w Gi Dr.Armbruster again 2/24, recommended to continue Panda tube feeds for now and will re-eval down the road for G tube. -AT this time she will need to be discharged with a Panda tube due to lack of other options and have GI FU in 2-3weeks  Anemia  -Acute on chronic, multifactorial  -2 weeks ago found to have proximal stomach and duodenal ulcer, status post EGD on 09/04/2015, then required Gastroduodenal Artery coiling/embolization by IR on 09-07-15, required 5units PRBC then -Keep on PPI, Transfused 2 units this admission, had some dark stools earlier this admission, now resolved -hb down to 7.6 today, will transfuse 1 unit of PRBC  Invasive laryngeal SCC, suspicious lymph nodes involvement per ENT -Seen by  ENT & oncology last month, underwent laryngoscopy/ biopsy on 1/20, about 5.4 subglottic mass. -Followed by Dr.Rosen and he recommended FU with ENT Dr. Fenton Malling at [facility] for [place] for Surgery -s/p tracheostomy placement by ENT on 09/05/2015 -Seen by Oncology Dr.Gorsuch and Rad Onc, and Palliative consult completed -Pt and family wants full aggressive scope of Rx -  per Dr.Gorsuch, Patient's prognosis is poor, poor candidate for chemotherapy, also most likely has renal cancer.  -No PEG tube placed last admission due to duodenal ulcers -Was on TNA via PICC line -Called and d/w  Dr.Gorsuch 2/13, she recommended Hospice, unfortunately pt and family want full Aggressive scope of Rx. -Patient should follow with [facility] for her laryngeal cancer.  Left renal mass: Highly suspicious for renal cell carcinoma.  -Case d/w Dr. Ree Kida last admission, recommended outpatient follow-up with him in the office for consideration of nephrectomy in the future  DM -SSI for now  Morbid obesity:  -nutrition via tube feeds now  Hypernatremia -improving, on d5W -stop d5w and add free water  DVT proph:  -SCDs, due to anemia, recent massive GI bleed  Code Status: FUll Code Family Communication: none at bedside, left message for daughter 2/24 and 2/23, Ashley Ramsey [id number] Disposition Plan: home pending nutrition plans  Objective: Filed Vitals:   10/08/15 0301 10/08/15 0437  BP:  136/75  Pulse: 98 98  Temp:  98.5 F (36.9 C)  Resp: 20 20    Intake/Output Summary (Last 24 hours) at 10/08/15 1130 Last data filed at 10/08/15 [id number]  Gross per 24 hour  Intake    450 ml  Output    351 ml  Net     99 ml   Filed Weights   10/06/15 0401 10/07/15 0446 10/08/15 0341  Weight: 86.546 kg (190 lb 12.8 oz) 87.317 kg (192 lb 8 oz) 88.043 kg (194 lb 1.6 oz)    Exam:   General:  AAOx3, chronically ill appearing, no distress  HEENT: around trach some phlegm noted around this  Cardiovascular: S1S2/RRR  Respiratory: conducted upper airway sounds  Abdomen: soft, NT, BS present  Musculoskeletal: no edema c/c   Data Reviewed: Basic Metabolic Panel:  Recent Labs Lab 10/04/15 0603 10/05/15 0605 10/06/15 0515 10/07/15 0420 10/08/15 0525  NA 148* 147* 146* 140 143  K 3.5 3.4* 3.4* 4.2 4.1  CL 111 109 106 103 104  CO2 27 28 27 26 29   GLUCOSE 180* 161* 213* 168* 207*  BUN <5* <5* <5* 5* 8  CREATININE 1.15* 1.10* 1.01* 1.03* 0.87  CALCIUM 8.3* 8.1* 8.2* 8.2* 8.0*  PHOS 3.0 2.7 2.6 2.9 3.3   Liver Function Tests:  Recent Labs Lab 10/04/15 0603  10/05/15 0605 10/06/15 0515 10/07/15 0420 10/08/15 0525  ALBUMIN 2.1* 2.0* 1.9* 2.0* 1.8*   No results for input(s): LIPASE, AMYLASE in the last 168 hours. No results for input(s): AMMONIA in the last 168 hours. CBC:  Recent Labs Lab 10/04/15 0421 10/05/15 0605 10/06/15 0515 10/07/15 0430 10/08/15 0525  WBC 17.7* 16.6* 16.8* 17.5* 14.4*  HGB 9.3* 8.3* 8.5* 8.6* 7.6*  HCT 30.7* 26.5* 27.9* 27.4* 25.2*  MCV 88.0 88.0 87.5 88.4 87.2  PLT 247 222 228 214 204   Cardiac Enzymes: No results for input(s): CKTOTAL, CKMB, CKMBINDEX, TROPONINI in the last 168 hours. BNP (last 3 results) No results for input(s): BNP in the last 8760 hours.  ProBNP (last 3 results) No results for input(s): PROBNP in the last 8760 hours.  CBG:  Recent Labs Lab 10/07/15 1724 10/07/15 2006 10/08/15 0003 10/08/15 0436 10/08/15 0838  GLUCAP 127* 202* 161* 150* 217*    Recent Results (from the past 240 hour(s))  Culture, blood (routine x 2)     Status: None   Collection Time: 09/28/15  3:20 PM  Result Value Ref Range Status   Specimen Description BLOOD  RIGHT ARM  Final   Special Requests IN PEDIATRIC BOTTLE 3CC  Final   Culture NO GROWTH 5 DAYS  Final   Report Status 10/03/2015 FINAL  Final  Culture, blood (routine x 2)     Status: None   Collection Time: 09/28/15  3:37 PM  Result Value Ref Range Status   Specimen Description BLOOD RIGHT HAND  Final   Special Requests IN PEDIATRIC BOTTLE 3CC  Final   Culture NO GROWTH 5 DAYS  Final   Report Status 10/03/2015 FINAL  Final     Studies: Dg Abd 1 View  10/07/2015  CLINICAL DATA:  60 year old female undergoing fluoroscopic guided feeding tube placement. Initial encounter. EXAM: ABDOMEN - 1 VIEW COMPARISON:  10/03/2015. FLUOROSCOPY TIME:  7 minutes 0 seconds FINDINGS: Single fluoroscopic image demonstrating feeding tube placement to the distal duodenum. Contrast injection opacifies the distal duodenum and proximal small bowel. 20 mL of  water-soluble contrast Omnipaque 300 was utilized. IMPRESSION: Fluoroscopic guided feeding tube placement with tip at the distal duodenum. Electronically Signed   By: Genevie Ann M.D.   On: 10/07/2015 16:04   Dg Addison Bailey G Tube Plc W/fl-no Rad  10/07/2015  CLINICAL DATA:  NASO G TUBE PLACEMENT WITH FLUORO Fluoroscopy was utilized by the requesting physician.  No radiographic interpretation.    Scheduled Meds: . sodium chloride   Intravenous Once  . alteplase  2 mg Intracatheter Once  . alteplase  2 mg Intracatheter Once  . ampicillin (OMNIPEN) IV  2 g Intravenous 6 times per day  . fluconazole (DIFLUCAN) IV  400 mg Intravenous Q24H  . free water  300 mL Per Tube QID  . insulin aspart  0-9 Units Subcutaneous 6 times per day  . pantoprazole (PROTONIX) IV  40 mg Intravenous Daily   Continuous Infusions: . feeding supplement (VITAL 1.5 CAL) 1,000 mL (10/06/15 1508)   Antibiotics Given (last 72 hours)    Date/Time Action Medication Dose Rate   10/05/15 1210 Given   ampicillin (OMNIPEN) 2 g in sodium chloride 0.9 % 50 mL IVPB 2 g 150 mL/hr   10/05/15 1718 Given   ampicillin (OMNIPEN) 2 g in sodium chloride 0.9 % 50 mL IVPB 2 g 150 mL/hr   10/05/15 1957 Given   ampicillin (OMNIPEN) 2 g in sodium chloride 0.9 % 50 mL IVPB 2 g 150 mL/hr   10/06/15 0006 Given   ampicillin (OMNIPEN) 2 g in sodium chloride 0.9 % 50 mL IVPB 2 g 150 mL/hr   10/06/15 0355 Given   ampicillin (OMNIPEN) 2 g in sodium chloride 0.9 % 50 mL IVPB 2 g 150 mL/hr   10/06/15 0855 Given   ampicillin (OMNIPEN) 2 g in sodium chloride 0.9 % 50 mL IVPB 2 g 150 mL/hr   10/06/15 1303 Given   ampicillin (OMNIPEN) 2 g in sodium chloride 0.9 % 50 mL IVPB 2 g 150 mL/hr   10/06/15 1508 Given   ampicillin (OMNIPEN) 2 g in sodium chloride 0.9 % 50 mL IVPB 2 g 150 mL/hr   10/06/15 2011 Given   ampicillin (OMNIPEN) 2 g in sodium chloride 0.9 % 50 mL IVPB 2 g 150 mL/hr   10/07/15 0022 Given   ampicillin (OMNIPEN) 2 g in sodium chloride 0.9 %  50 mL IVPB 2 g 150 mL/hr   10/07/15 0504 Given   ampicillin (OMNIPEN) 2 g in sodium chloride 0.9 % 50 mL IVPB 2 g 150 mL/hr   10/07/15 0900 Given   ampicillin (OMNIPEN) 2 g  in sodium chloride 0.9 % 50 mL IVPB 2 g 150 mL/hr   10/07/15 1559 Given   ampicillin (OMNIPEN) 2 g in sodium chloride 0.9 % 50 mL IVPB 2 g 150 mL/hr   10/07/15 2134 Given   ampicillin (OMNIPEN) 2 g in sodium chloride 0.9 % 50 mL IVPB 2 g 150 mL/hr   10/08/15 0011 Given   ampicillin (OMNIPEN) 2 g in sodium chloride 0.9 % 50 mL IVPB 2 g 150 mL/hr   10/08/15 0444 Given   ampicillin (OMNIPEN) 2 g in sodium chloride 0.9 % 50 mL IVPB 2 g 150 mL/hr   10/08/15 0800 Given   ampicillin (OMNIPEN) 2 g in sodium chloride 0.9 % 50 mL IVPB 2 g 150 mL/hr   10/08/15 1200 Given   ampicillin (OMNIPEN) 2 g in sodium chloride 0.9 % 50 mL IVPB 2 g 150 mL/hr      Principal Problem:   Severe sepsis (HCC) Active Problems:   Type 2 diabetes mellitus without complication, without long-term current use of insulin (HCC)   AKI (acute kidney injury) ([place])   HCAP (healthcare-associated pneumonia)   DM2 (diabetes mellitus, type 2) ([place])   Pressure ulcer   Hypoxia   Dysphagia, pharyngeal phase   Duodenal ulcer hemorrhage    Time spent: 63min    Chrisie Jankovich  [facility] Pager [phone]. If 7PM-7AM, please contact night-coverage at [email or web address], password [facility] 10/08/2015, 11:30 AM  LOS: 15 days

## 2015-10-09 LAB — GLUCOSE, CAPILLARY
GLUCOSE-CAPILLARY: 194 mg/dL — AB (ref 65–99)
GLUCOSE-CAPILLARY: 209 mg/dL — AB (ref 65–99)
GLUCOSE-CAPILLARY: 230 mg/dL — AB (ref 65–99)
Glucose-Capillary: 169 mg/dL — ABNORMAL HIGH (ref 65–99)
Glucose-Capillary: 205 mg/dL — ABNORMAL HIGH (ref 65–99)
Glucose-Capillary: 242 mg/dL — ABNORMAL HIGH (ref 65–99)

## 2015-10-09 LAB — TYPE AND SCREEN
ABO/RH(D): B POS
ANTIBODY SCREEN: NEGATIVE
Unit division: 0

## 2015-10-09 LAB — CBC
HEMATOCRIT: 29.5 % — AB (ref 36.0–46.0)
Hemoglobin: 9.4 g/dL — ABNORMAL LOW (ref 12.0–15.0)
MCH: 27.3 pg (ref 26.0–34.0)
MCHC: 31.9 g/dL (ref 30.0–36.0)
MCV: 85.8 fL (ref 78.0–100.0)
PLATELETS: 199 10*3/uL (ref 150–400)
RBC: 3.44 MIL/uL — AB (ref 3.87–5.11)
RDW: 15.6 % — ABNORMAL HIGH (ref 11.5–15.5)
WBC: 16.7 10*3/uL — AB (ref 4.0–10.5)

## 2015-10-09 LAB — BASIC METABOLIC PANEL
ANION GAP: 13 (ref 5–15)
BUN: 7 mg/dL (ref 6–20)
CHLORIDE: 102 mmol/L (ref 101–111)
CO2: 29 mmol/L (ref 22–32)
Calcium: 8.7 mg/dL — ABNORMAL LOW (ref 8.9–10.3)
Creatinine, Ser: 0.85 mg/dL (ref 0.44–1.00)
GFR calc Af Amer: >60 mL/min (ref >60)
GLUCOSE: 244 mg/dL — AB (ref 65–99)
POTASSIUM: 3.7 mmol/L (ref 3.5–5.1)
Sodium: 144 mmol/L (ref 135–145)

## 2015-10-09 NOTE — Progress Notes (Signed)
[facility] PROGRESS NOTE  Ashley Ramsey CWC:[id number] DOB: 1955/12/14 DOA: 09/23/2015 PCP: Arnoldo Morale, MD   Subjective: Feels ok, no complaints, freq stools on tube feeds, Panda replaced 2/23  HPI: Ashley Ramsey is a 60 year old female with a history of Type 2 DM (A1c 6.2), HTN was diagnosed with laryngeal cancer during a hospitalization at [facility] from 08/29/15 to 09/12/15. On CT neck she was found to have a laryngeal mass, CT abdomen revealed a left renal mass.  ENT was consulted-patient underwent a biopsy on 1/20 confirming malignancy. Hospital course was complicated by development of Massive upper  GI bleeding and associated blood loss anemia, s/p 5 units PRBC. GI was consulted, endoscopy revealed duodenal ulcer; she also had Gastroduodenal artery embolizatyion by IR.  She underwent tracheostomy on 09/04/2015; PEG tube could not be placed due to gastric and duodenal ulcers.  Seen by Oncology and Palliative medicine and Radiation Oncology then, Supposed to FU with ENT at [facility] now admitted with Sepsis, found to have Enterococcal and Candida albicans fungemia PICC removed, last culture was 2/15 -NGTD  PICC Placed 2/21 On nutrition via Panda  Assessment/Plan:  Enterococcal bacteremia and Candida albicans fungemia -Likely PICC related -PICC removed 09/25/13 -Clinically improved -Continue Ampicillin and fluconazole per ID recommendations for 1 month -Repeat blood culture - NGTD from 09/28/2015. -2-D echocardiogram complete, no evidence of vegetation -PICC Replaced 2/21 -FU with ID -remains stable and afebrile now  Dysphagia -Patient has dysphagia, recommended to be NPO, has a 5 cm subglottic mass and tracheostomy -She was getting TNA via PICC, then PICC line was discontinued for line holiday. -The PEG tube was not placed during last hospital stay because of presence of duodenal ulcer on 09/03/2013 EGD.  -GI initially recommended general surgery eval, general surgery  recommended GI to relook at the patient. -GI reevaluated her on 2/17, Dr.Elmahi had discussion with Dr. Loletha Carrow, he recommended interventional radiology. -IR recommended to hold off PEG tube for at least 6 weeks because of recent GI bleed and embolization procedure,  -now on Tube feeds via Panda tube, panda pulled out today, will ask radiology to replace. -(2/24) I called and d/w IR Dr.Art Hoss, regarding PEG tube down the road (mid march, 6weeks from embolization) , he said she would need an EGD before this could be considered and PEG via endoscopy would be the right way to proceed rather than blind by IR, d/w Gi Dr.Armbruster again 2/24, recommended to continue Panda tube feeds for now and will re-eval down the road for G tube. -At this time she will need to be discharged with a Panda tube due to lack of other options and have GI FU in 2-3weeks -Panda tube position will need to be checked frequently by home health RN and not pushed back if comes out  Anemia  -Acute on chronic, multifactorial  -2 weeks ago found to have proximal stomach and duodenal ulcer, status post EGD on 09/04/2015, then required Gastroduodenal Artery coiling/embolization by IR on 09-07-15, required 5units PRBC then -Keep on PPI, Transfused 2 units this admission, had some dark stools earlier this admission, now resolved -hb down to 7.6 2/25,  transfused 1 unit of PRBC  Invasive laryngeal SCC, suspicious lymph nodes involvement per ENT -Seen by  ENT & oncology last month, underwent laryngoscopy/ biopsy on 1/20, about 5.4 subglottic mass. -Followed by Dr.Rosen and he recommended FU with ENT Dr. Fenton Malling at [facility] for Eval for Surgery -s/p tracheostomy placement by ENT on 09/05/2015 -  Seen by Oncology Dr.Gorsuch and Rad Onc, and Palliative consult completed -Pt and family wants full aggressive scope of Rx -per Dr.Gorsuch, Patient's prognosis is poor, poor candidate for chemotherapy, also most likely has renal cancer.   -No PEG tube placed last admission due to duodenal ulcers -Was on TNA via PICC line -Called and d/w Dr.Gorsuch 2/13, she recommended Hospice, unfortunately pt and family want full Aggressive scope of Rx. -Patient should follow with [facility] for her laryngeal cancer.  Left renal mass: Highly suspicious for renal cell carcinoma.  -Case d/w Dr. Ree Kida last admission, recommended outpatient follow-up with him in the office for consideration of nephrectomy in the future  DM -SSI for now  Morbid obesity:  -nutrition via tube feeds now  Hypernatremia -improving, on d5W -stop d5w and add free water  DVT proph:  -SCDs, due to anemia, recent massive GI bleed  Code Status: FUll Code Family Communication: none at bedside, left message for daughter 2/25 and 2/24 and 2/23, met another daughter Ashley Ramsey today [id number]) Disposition Plan: home in 1-2days   Objective: Filed Vitals:   10/09/15 0404 10/09/15 1232  BP: 144/76 137/60  Pulse: 103 102  Temp: 98.7 F (37.1 C) 98.9 F (37.2 C)  Resp: 16 18    Intake/Output Summary (Last 24 hours) at 10/09/15 1245 Last data filed at 10/09/15 1232  Gross per 24 hour  Intake 938.33 ml  Output   1900 ml  Net -961.67 ml   Filed Weights   10/07/15 0446 10/08/15 0341 10/09/15 0404  Weight: 87.317 kg (192 lb 8 oz) 88.043 kg (194 lb 1.6 oz) 87.771 kg (193 lb 8 oz)    Exam:   General:  AAOx3, chronically ill appearing, no distress  HEENT: around trach some phlegm noted around this  Cardiovascular: S1S2/RRR  Respiratory: conducted upper airway sounds  Abdomen: soft, NT, BS present  Musculoskeletal: no edema c/c   Data Reviewed: Basic Metabolic Panel:  Recent Labs Lab 10/04/15 0603 10/05/15 0605 10/06/15 0515 10/07/15 0420 10/08/15 0525 10/09/15 1050  NA 148* 147* 146* 140 143 144  K 3.5 3.4* 3.4* 4.2 4.1 3.7  CL 111 109 106 103 104 102  CO2 '27 28 27 26 29 29  '$ GLUCOSE 180* 161* 213* 168* 207* 244*   BUN <5* <5* <5* 5* 8 7  CREATININE 1.15* 1.10* 1.01* 1.03* 0.87 0.85  CALCIUM 8.3* 8.1* 8.2* 8.2* 8.0* 8.7*  PHOS 3.0 2.7 2.6 2.9 3.3  --    Liver Function Tests:  Recent Labs Lab 10/04/15 0603 10/05/15 0605 10/06/15 0515 10/07/15 0420 10/08/15 0525  ALBUMIN 2.1* 2.0* 1.9* 2.0* 1.8*   No results for input(s): LIPASE, AMYLASE in the last 168 hours. No results for input(s): AMMONIA in the last 168 hours. CBC:  Recent Labs Lab 10/05/15 0605 10/06/15 0515 10/07/15 0430 10/08/15 0525 10/09/15 1050  WBC 16.6* 16.8* 17.5* 14.4* 16.7*  HGB 8.3* 8.5* 8.6* 7.6* 9.4*  HCT 26.5* 27.9* 27.4* 25.2* 29.5*  MCV 88.0 87.5 88.4 87.2 85.8  PLT 222 228 214 204 199   Cardiac Enzymes: No results for input(s): CKTOTAL, CKMB, CKMBINDEX, TROPONINI in the last 168 hours. BNP (last 3 results) No results for input(s): BNP in the last 8760 hours.  ProBNP (last 3 results) No results for input(s): PROBNP in the last 8760 hours.  CBG:  Recent Labs Lab 10/08/15 1949 10/09/15 0031 10/09/15 0357 10/09/15 0744 10/09/15 1205  GLUCAP 235* 194* 169* 205* 209*    No results found for  this or any previous visit (from the past 240 hour(s)).   Studies: Dg Abd 1 View  10/07/2015  CLINICAL DATA:  60 year old female undergoing fluoroscopic guided feeding tube placement. Initial encounter. EXAM: ABDOMEN - 1 VIEW COMPARISON:  10/03/2015. FLUOROSCOPY TIME:  7 minutes 0 seconds FINDINGS: Single fluoroscopic image demonstrating feeding tube placement to the distal duodenum. Contrast injection opacifies the distal duodenum and proximal small bowel. 20 mL of water-soluble contrast Omnipaque 300 was utilized. IMPRESSION: Fluoroscopic guided feeding tube placement with tip at the distal duodenum. Electronically Signed   By: Genevie Ann M.D.   On: 10/07/2015 16:04   Dg Addison Bailey G Tube Plc W/fl-no Rad  10/07/2015  CLINICAL DATA:  NASO G TUBE PLACEMENT WITH FLUORO Fluoroscopy was utilized by the requesting physician.   No radiographic interpretation.    Scheduled Meds: . alteplase  2 mg Intracatheter Once  . alteplase  2 mg Intracatheter Once  . ampicillin (OMNIPEN) IV  2 g Intravenous 6 times per day  . fluconazole (DIFLUCAN) IV  400 mg Intravenous Q24H  . free water  300 mL Per Tube QID  . insulin aspart  0-9 Units Subcutaneous 6 times per day  . pantoprazole (PROTONIX) IV  40 mg Intravenous Daily   Continuous Infusions: . feeding supplement (VITAL 1.5 CAL) 1,000 mL (10/06/15 1508)   Antibiotics Given (last 72 hours)    Date/Time Action Medication Dose Rate   10/06/15 1303 Given   ampicillin (OMNIPEN) 2 g in sodium chloride 0.9 % 50 mL IVPB 2 g 150 mL/hr   10/06/15 1508 Given   ampicillin (OMNIPEN) 2 g in sodium chloride 0.9 % 50 mL IVPB 2 g 150 mL/hr   10/06/15 2011 Given   ampicillin (OMNIPEN) 2 g in sodium chloride 0.9 % 50 mL IVPB 2 g 150 mL/hr   10/07/15 0022 Given   ampicillin (OMNIPEN) 2 g in sodium chloride 0.9 % 50 mL IVPB 2 g 150 mL/hr   10/07/15 0504 Given   ampicillin (OMNIPEN) 2 g in sodium chloride 0.9 % 50 mL IVPB 2 g 150 mL/hr   10/07/15 0900 Given   ampicillin (OMNIPEN) 2 g in sodium chloride 0.9 % 50 mL IVPB 2 g 150 mL/hr   10/07/15 1559 Given   ampicillin (OMNIPEN) 2 g in sodium chloride 0.9 % 50 mL IVPB 2 g 150 mL/hr   10/07/15 2134 Given   ampicillin (OMNIPEN) 2 g in sodium chloride 0.9 % 50 mL IVPB 2 g 150 mL/hr   10/08/15 0011 Given   ampicillin (OMNIPEN) 2 g in sodium chloride 0.9 % 50 mL IVPB 2 g 150 mL/hr   10/08/15 0444 Given   ampicillin (OMNIPEN) 2 g in sodium chloride 0.9 % 50 mL IVPB 2 g 150 mL/hr   10/08/15 0800 Given   ampicillin (OMNIPEN) 2 g in sodium chloride 0.9 % 50 mL IVPB 2 g 150 mL/hr   10/08/15 1200 Given   ampicillin (OMNIPEN) 2 g in sodium chloride 0.9 % 50 mL IVPB 2 g 150 mL/hr   10/08/15 1600 Given   ampicillin (OMNIPEN) 2 g in sodium chloride 0.9 % 50 mL IVPB 2 g 150 mL/hr   10/08/15 2208 Given   ampicillin (OMNIPEN) 2 g in sodium chloride  0.9 % 50 mL IVPB 2 g 150 mL/hr   10/09/15 0049 Given   ampicillin (OMNIPEN) 2 g in sodium chloride 0.9 % 50 mL IVPB 2 g 150 mL/hr   10/09/15 0414 Given   ampicillin (OMNIPEN) 2 g in  sodium chloride 0.9 % 50 mL IVPB 2 g 150 mL/hr   10/09/15 0800 Given   ampicillin (OMNIPEN) 2 g in sodium chloride 0.9 % 50 mL IVPB 2 g 150 mL/hr   10/09/15 1200 Given   ampicillin (OMNIPEN) 2 g in sodium chloride 0.9 % 50 mL IVPB 2 g 150 mL/hr      Principal Problem:   Severe sepsis (HCC) Active Problems:   Type 2 diabetes mellitus without complication, without long-term current use of insulin (HCC)   AKI (acute kidney injury) ([place])   HCAP (healthcare-associated pneumonia)   DM2 (diabetes mellitus, type 2) (HCC)   Pressure ulcer   Hypoxia   Dysphagia, pharyngeal phase   Duodenal ulcer hemorrhage    Time spent: 56mn    Taryn Nave  [facility] Pager [phone] If 7PM-7AM, please contact night-coverage at [email or web address], password [facility]2/26/2017, 12:45 PM  LOS: 16 days

## 2015-10-10 ENCOUNTER — Inpatient Hospital Stay (HOSPITAL_COMMUNITY): Payer: Medicaid Other

## 2015-10-10 LAB — BASIC METABOLIC PANEL
ANION GAP: 12 (ref 5–15)
BUN: 7 mg/dL (ref 6–20)
CALCIUM: 8.7 mg/dL — AB (ref 8.9–10.3)
CO2: 31 mmol/L (ref 22–32)
Chloride: 102 mmol/L (ref 101–111)
Creatinine, Ser: 0.89 mg/dL (ref 0.44–1.00)
GFR calc Af Amer: >60 mL/min (ref >60)
GFR calc non Af Amer: >60 mL/min (ref >60)
GLUCOSE: 267 mg/dL — AB (ref 65–99)
Potassium: 3.4 mmol/L — ABNORMAL LOW (ref 3.5–5.1)
Sodium: 145 mmol/L (ref 135–145)

## 2015-10-10 LAB — H PYLORI, IGM, IGG, IGA AB
H Pylori IgG: 2.6 U/mL — ABNORMAL HIGH (ref 0.0–0.8)
H. PYLOGI, IGA ABS: 12.5 U — AB (ref 0.0–8.9)

## 2015-10-10 LAB — GLUCOSE, CAPILLARY
GLUCOSE-CAPILLARY: 146 mg/dL — AB (ref 65–99)
GLUCOSE-CAPILLARY: 120 mg/dL — AB (ref 65–99)
GLUCOSE-CAPILLARY: 140 mg/dL — AB (ref 65–99)
Glucose-Capillary: 147 mg/dL — ABNORMAL HIGH (ref 65–99)
Glucose-Capillary: 223 mg/dL — ABNORMAL HIGH (ref 65–99)
Glucose-Capillary: 228 mg/dL — ABNORMAL HIGH (ref 65–99)

## 2015-10-10 LAB — MAGNESIUM: MAGNESIUM: 1.7 mg/dL (ref 1.7–2.4)

## 2015-10-10 LAB — CBC
HEMATOCRIT: 29.3 % — AB (ref 36.0–46.0)
HEMOGLOBIN: 9 g/dL — AB (ref 12.0–15.0)
MCH: 26.8 pg (ref 26.0–34.0)
MCHC: 30.7 g/dL (ref 30.0–36.0)
MCV: 87.2 fL (ref 78.0–100.0)
Platelets: 202 10*3/uL (ref 150–400)
RBC: 3.36 MIL/uL — AB (ref 3.87–5.11)
RDW: 15.7 % — ABNORMAL HIGH (ref 11.5–15.5)
WBC: 15 10*3/uL — ABNORMAL HIGH (ref 4.0–10.5)

## 2015-10-10 LAB — PHOSPHORUS: PHOSPHORUS: 2.7 mg/dL (ref 2.5–4.6)

## 2015-10-10 MED ORDER — TRACE MINERALS CR-CU-MN-SE-ZN 10-1000-500-60 MCG/ML IV SOLN
INTRAVENOUS | Status: DC
  Administered 2015-10-10: 18:00:00 via INTRAVENOUS
  Filled 2015-10-10: qty 960

## 2015-10-10 MED ORDER — IOHEXOL 300 MG/ML  SOLN: 50.0000 mL | Freq: Once | INTRAMUSCULAR | Status: DC | PRN

## 2015-10-10 MED ORDER — POTASSIUM CHLORIDE 10 MEQ/50ML IV SOLN
10.0000 meq | INTRAVENOUS | Status: AC
  Administered 2015-10-10 (×3): 10 meq via INTRAVENOUS
  Filled 2015-10-10 (×3): qty 50

## 2015-10-10 MED ORDER — POTASSIUM PHOSPHATES 15 MMOLE/5ML IV SOLN
10.0000 mmol | Freq: Once | INTRAVENOUS | Status: AC
  Administered 2015-10-10: 10 mmol via INTRAVENOUS
  Filled 2015-10-10: qty 3.33

## 2015-10-10 MED ORDER — MAGNESIUM SULFATE IN D5W 10-5 MG/ML-% IV SOLN
1.0000 g | Freq: Once | INTRAVENOUS | Status: AC
  Administered 2015-10-10: 1 g via INTRAVENOUS
  Filled 2015-10-10: qty 100

## 2015-10-10 MED ORDER — INSULIN ASPART 100 UNIT/ML ~~LOC~~ SOLN
0.0000 [IU] | SUBCUTANEOUS | Status: DC
  Administered 2015-10-10 – 2015-10-11 (×5): 2 [IU] via SUBCUTANEOUS

## 2015-10-10 MED ORDER — FAT EMULSION 20 % IV EMUL
120.0000 mL | INTRAVENOUS | Status: DC
Start: 2015-10-10 — End: 2015-10-11
  Administered 2015-10-10: 120 mL via INTRAVENOUS
  Filled 2015-10-10: qty 250

## 2015-10-10 NOTE — Progress Notes (Signed)
[facility] PROGRESS NOTE  Ashley Ramsey MPN:[id number] DOB: [birth year] DOA: 09/23/2015 PCP: Ashley Morale, MD   Subjective: Feels ok, no complaints, freq stools on tube feeds, Panda replaced 2/23  HPI: Ashley Ramsey is a 60 year old female with a history of Type 2 DM (A1c 6.2), HTN was diagnosed with laryngeal cancer during a hospitalization at [facility] from 08/29/15 to 09/12/15. On CT neck she was found to have a laryngeal mass, CT abdomen revealed a left renal mass.  ENT was consulted-patient underwent a biopsy on 1/20 confirming malignancy. Hospital course was complicated by development of Massive upper  GI bleeding and associated blood loss anemia, s/p 5 units PRBC. GI was consulted, endoscopy revealed duodenal ulcer; she also had Gastroduodenal artery embolizatyion by IR.  She underwent tracheostomy on 09/04/2015; PEG tube could not be placed due to gastric and duodenal ulcers.  Seen by Oncology and Palliative medicine and Radiation Oncology then, Supposed to FU with ENT at [facility] now admitted with Sepsis, found to have Enterococcal and Candida albicans fungemia PICC removed, last culture was 2/15 -NGTD  PICC Placed 2/21 On nutrition via Panda  Assessment/Plan:  Enterococcal bacteremia and Candida albicans fungemia -Felt to be PICC related -PICC removed 09/25/13 -Clinically improved -Was initially on broad spectrum Abx, then transitioned to IV Ampicillin and fluconazole per ID recommendations for 1 month -Repeat blood culture - NGTD from 09/28/2015. -2-D echocardiogram complete, no evidence of vegetation -PICC Replaced 2/21 -FU with ID -remains stable and afebrile now  Dysphagia -Patient has dysphagia, recommended to be NPO, has a 5 cm subglottic mass and tracheostomy -She was getting TNA via PICC, then PICC line was discontinued for line holiday. -The PEG tube was not placed during last hospital stay because of presence of duodenal ulcer on 09/03/2013 EGD.  -GI  initially recommended general surgery eval, general surgery recommended GI to relook at the patient. -GI reevaluated her on 2/17, Ashley Ramsey had discussion with Ashley Ramsey, he recommended interventional radiology. -IR recommended to hold off PEG tube for at least 6 weeks because of recent GI bleed and embolization procedure,  -then started on Tube feeds via Panda tube, panda pulled out twice this week. -(2/24) I called and d/w IR Ashley Ramsey, regarding PEG tube down the road (mid march, 6weeks from embolization) , he said she would need an EGD before this could be considered and PEG via endoscopy would be the right way to proceed rather than blind by IR, d/w Gi Ashley Ramsey again 2/24, recommended to continue Panda tube feeds for now and will re-eval down the road for G tube. -At this time she will need to be discharged with TNA via PICC line  and have GI FU in 2-3weeks -since panda has come out inadvertently twice in a matter of 4days, i do not think it is safe to discharge her with this, resume TNA  Anemia  -Acute on chronic, multifactorial  -2 weeks ago found to have proximal stomach and duodenal ulcer, status post EGD on 09/04/2015, then required Gastroduodenal Artery coiling/embolization by IR on 09-07-15, required 5units PRBC then -Keep on PPI, Transfused 2 units this admission, had some dark stools earlier this admission, now resolved -hb down to 7.6 2/25,  transfused 1 unit of PRBC -Hb stable since  Invasive laryngeal SCC, suspicious lymph nodes involvement per ENT -Seen by  ENT & oncology last month, underwent laryngoscopy/ biopsy on 1/20, about 5.4 subglottic mass. -Followed by Ashley Ramsey and he recommended FU with ENT Ashley Ramsey  at [facility] for [place] for Surgery -s/p tracheostomy placement by ENT on 09/05/2015 -Seen by Oncology Ashley Ramsey and Rad Onc, and Palliative consult completed -Pt and family wants full aggressive scope of Rx -per Ashley Ramsey, Patient's prognosis is poor,  poor candidate for chemotherapy, also most likely has renal cancer.  -No PEG tube placed last admission due to duodenal ulcers -Was on TNA via PICC line -Called and d/w Ashley Ramsey 2/13, she recommended Hospice, unfortunately pt and family want full Aggressive scope of Rx. -Patient should [facility] with [facility] for her laryngeal cancer.  Left renal mass: Highly suspicious for renal cell carcinoma.  -Case d/w Ashley Ramsey last admission, recommended outpatient follow-up with him in the office for consideration of nephrectomy in the future  DM -SSI for now  Morbid obesity:  -nutrition via tube feeds now  Hypernatremia -improving, on d5W -stop d5w and add free water  DVT proph:  -SCDs, due to anemia, recent massive GI bleed  Code Status: FUll Code Family Communication: none at bedside, left message for daughter 2/25 and 2/24 and 2/23, met another daughter Ashley Ramsey today [phone]) Disposition Plan: home tomorrow if stable   Objective: Filed Vitals:   10/10/15 1040 10/10/15 1122  BP:  157/76  Pulse: 112 94  Temp:  98.8 F (37.1 C)  Resp: 18 18    Intake/Output Summary (Last 24 hours) at 10/10/15 1240 Last data filed at 10/10/15 0837  Gross per 24 hour  Intake    450 ml  Output    775 ml  Net   -325 ml   Filed Weights   10/08/15 0341 10/09/15 0404 10/10/15 0429  Weight: 88.043 kg (194 lb 1.6 oz) 87.771 kg (193 lb 8 oz) 87.907 kg (193 lb 12.8 oz)    Exam:   General:  AAOx3, chronically ill appearing, no distress  HEENT: around trach some phlegm noted around this  Cardiovascular: S1S2/RRR  Respiratory: conducted upper airway sounds  Abdomen: soft, NT, BS present  Musculoskeletal: no edema c/c   Data Reviewed: Basic Metabolic Panel:  Recent Labs Lab 10/05/15 0605 10/06/15 0515 10/07/15 0420 10/08/15 0525 10/09/15 1050 10/10/15 0500 10/10/15 0502  NA 147* 146* 140 143 144  --  145  K 3.4* 3.4* 4.2 4.1 3.7  --  3.4*  CL 109 106  103 104 102  --  102  CO2 _0 --  31  GLUCOSE 161* 213* 168* 207* 244*  --  267*  BUN <5* <5* 5* 8 7  --  7  CREATININE 1.10* 1.01* 1.03* 0.87 0.85  --  0.89  CALCIUM 8.1* 8.2* 8.2* 8.0* 8.7*  --  8.7*  MG  --   --   --   --   --  1.7  --   PHOS 2.7 2.6 2.9 3.3  --  2.7  --    Liver Function Tests:  Recent Labs Lab 10/04/15 0603 10/05/15 0605 10/06/15 0515 10/07/15 0420 10/08/15 0525  ALBUMIN 2.1* 2.0* 1.9* 2.0* 1.8*   No results for input(s): LIPASE, AMYLASE in the last 168 hours. No results for input(s): AMMONIA in the last 168 hours. CBC:  Recent Labs Lab 10/06/15 0515 10/07/15 0430 10/08/15 0525 10/09/15 1050 10/10/15 0501  WBC 16.8* 17.5* 14.4* 16.7* 15.0*  HGB 8.5* 8.6* 7.6* 9.4* 9.0*  HCT 27.9* 27.4* 25.2* 29.5* 29.3*  MCV 87.5 88.4 87.2 85.8 87.2  PLT 228 214 204 199 202   Cardiac Enzymes: No results for input(s):  CKTOTAL, CKMB, CKMBINDEX, TROPONINI in the last 168 hours. BNP (last 3 results) No results for input(s): BNP in the last 8760 hours.  ProBNP (last 3 results) No results for input(s): PROBNP in the last 8760 hours.  CBG:  Recent Labs Lab 10/09/15 1949 10/10/15 0020 10/10/15 0426 10/10/15 0752 10/10/15 1127  GLUCAP 230* 223* 228* 146* 120*    No results found for this or any previous visit (from the past 240 hour(s)).   Studies: No results found.  Scheduled Meds: . alteplase  2 mg Intracatheter Once  . alteplase  2 mg Intracatheter Once  . ampicillin (OMNIPEN) IV  2 g Intravenous 6 times per day  . fluconazole (DIFLUCAN) IV  400 mg Intravenous Q24H  . free water  300 mL Per Tube QID  . insulin aspart  0-15 Units Subcutaneous 6 times per day  . pantoprazole (PROTONIX) IV  40 mg Intravenous Daily  . potassium chloride  10 mEq Intravenous Q1 Hr x 3  . potassium phosphate IVPB (mmol)  10 mmol Intravenous Once   Continuous Infusions: . Marland KitchenTPN (CLINIMIX-E) Adult     And  . fat emulsion     Antibiotics Given (last 72  hours)    Date/Time Action Medication Dose Rate   10/07/15 1559 Given   ampicillin (OMNIPEN) 2 g in sodium chloride 0.9 % 50 mL IVPB 2 g 150 mL/hr   10/07/15 2134 Given   ampicillin (OMNIPEN) 2 g in sodium chloride 0.9 % 50 mL IVPB 2 g 150 mL/hr   10/08/15 0011 Given   ampicillin (OMNIPEN) 2 g in sodium chloride 0.9 % 50 mL IVPB 2 g 150 mL/hr   10/08/15 0444 Given   ampicillin (OMNIPEN) 2 g in sodium chloride 0.9 % 50 mL IVPB 2 g 150 mL/hr   10/08/15 0800 Given   ampicillin (OMNIPEN) 2 g in sodium chloride 0.9 % 50 mL IVPB 2 g 150 mL/hr   10/08/15 1200 Given   ampicillin (OMNIPEN) 2 g in sodium chloride 0.9 % 50 mL IVPB 2 g 150 mL/hr   10/08/15 1600 Given   ampicillin (OMNIPEN) 2 g in sodium chloride 0.9 % 50 mL IVPB 2 g 150 mL/hr   10/08/15 2208 Given   ampicillin (OMNIPEN) 2 g in sodium chloride 0.9 % 50 mL IVPB 2 g 150 mL/hr   10/09/15 0049 Given   ampicillin (OMNIPEN) 2 g in sodium chloride 0.9 % 50 mL IVPB 2 g 150 mL/hr   10/09/15 0414 Given   ampicillin (OMNIPEN) 2 g in sodium chloride 0.9 % 50 mL IVPB 2 g 150 mL/hr   10/09/15 0800 Given   ampicillin (OMNIPEN) 2 g in sodium chloride 0.9 % 50 mL IVPB 2 g 150 mL/hr   10/09/15 1200 Given   ampicillin (OMNIPEN) 2 g in sodium chloride 0.9 % 50 mL IVPB 2 g 150 mL/hr   10/09/15 1600 Given   ampicillin (OMNIPEN) 2 g in sodium chloride 0.9 % 50 mL IVPB 2 g 150 mL/hr   10/09/15 2111 Given   ampicillin (OMNIPEN) 2 g in sodium chloride 0.9 % 50 mL IVPB 2 g 150 mL/hr   10/10/15 0210 Given   ampicillin (OMNIPEN) 2 g in sodium chloride 0.9 % 50 mL IVPB 2 g 150 mL/hr   10/10/15 0526 Given   ampicillin (OMNIPEN) 2 g in sodium chloride 0.9 % 50 mL IVPB 2 g 150 mL/hr   10/10/15 0900 Given   ampicillin (OMNIPEN) 2 g in sodium chloride 0.9 % 50  mL IVPB 2 g 150 mL/hr   10/10/15 1200 Given   ampicillin (OMNIPEN) 2 g in sodium chloride 0.9 % 50 mL IVPB 2 g 150 mL/hr      Principal Problem:   Severe sepsis (HCC) Active Problems:   Type 2  diabetes mellitus without complication, without long-term current use of insulin (HCC)   AKI (acute kidney injury) ([place])   HCAP (healthcare-associated pneumonia)   DM2 (diabetes mellitus, type 2) (HCC)   Pressure ulcer   Hypoxia   Dysphagia, pharyngeal phase   Duodenal ulcer hemorrhage    Time spent: 91mn    Ivin Rosenbloom  [facility] Pager [phone] If 7PM-7AM, please contact night-coverage at [email or web address], password [facility]2/27/2017, 12:40 PM  LOS: 17 days

## 2015-10-10 NOTE — Progress Notes (Signed)
[facility] Infusion Coordinator provided additional in hospital training today regarding administration of IV Ampicillin via CADD Solis pump, TPN via CADD Solis pump and Diflucan via CAF. Daughter acknowledged and verbalized understanding.  Daughter, Damiya, stated she wanted to try to take her mother home "one more time".  [facility] RN will plan for visit tomorrow PM on day of DC to support pt and family with home care needs/further teaching.  If patient discharges after hours, please call [phone].   [name] 10/10/2015, 11:48 PM

## 2015-10-10 NOTE — Progress Notes (Signed)
Nutrition Follow-up  DOCUMENTATION CODES:   Obesity unspecified  INTERVENTION:  TPN per pharamacy   NUTRITION DIAGNOSIS:   Inadequate oral intake related to inability to eat as evidenced by NPO status.  Ongoing  GOAL:   Patient will meet greater than or equal to 90% of their needs  Not being met at this time  MONITOR:   Labs, Weight trends, Skin, I & O's  REASON FOR ASSESSMENT:   Consult New TPN/TNA  ASSESSMENT:   60 y.o. Female with h/o laryngeal cancer, s/p trach, currently on TPN for nutrition. All of this occurred during a Jan 16th admission. Patient apparently has been declining in general since time of discharge but is especially more confused in past couple of days. WBC today had increased to 28k from 9 days ago. Patient sent to ED for eval.  Per chart, pt pulled NGT out early this AM. Due to NGT coming out twice in 4 days, plan is to resume TNA instead, per MD note. Pt's weight has stabilized over the past week while receiving 1980 kcal and 89 grams of protein via NGT. Blood glucose continues to fluctuate. Both home health and RT were at pt's bedside at time of visit. Per pharmacy note, plan to initiate Clinamix E 5/15 @ 40 ml/hr tonight.   Labs: glucose ranging 120 to 267 mg/dL, low calcium, low potassium  Diet Order:  Diet NPO time specified TPN (CLINIMIX-E) Adult  Skin:  Wound (see comment) (stage II pressure ulcer on neck)  Last BM:  2/26  Height:   Ht Readings from Last 1 Encounters:  09/23/15 _0  (1.6 m)    Weight:   Wt Readings from Last 1 Encounters:  10/10/15 193 lb 12.8 oz (87.907 kg)    Ideal Body Weight:  52.27 kg  BMI:  Body mass index is 34.34 kg/(m^2).  Estimated Nutritional Needs:   Kcal:  1900-2100  Protein:  85-100 grams  Fluid:  1.9-2 L/day  EDUCATION NEEDS:   No education needs identified at this time  [place], LDN Inpatient Clinical Dietitian Pager: [phone] After Hours Pager: [phone]

## 2015-10-10 NOTE — Progress Notes (Signed)
Occupational Therapy Treatment Patient Details Name: Ashley Ramsey MRN: [id number] DOB: 29-Dec-1955 Today's Date: 10/10/2015    History of present illness 60 yo female admitted with AMS sepsis  L LLL PNA recent admission 1/22 for trach/ peg with reveal of largyneal mass and renal mass PMH: dysphagia, anemia DM morbid obesity   OT comments  Pt demonstrates ability to make house hold transfers to bathroom, kitchen and door to exit home in case of fire distance. Pt however demonstrates poor awareness to lines / leads this admission requiring supervision for d/c home. NO family present this session to determine (A) level. Recommending dc to SNf at this time due to unknown family support level and currently risk for falls.   Follow Up Recommendations  SNF;Supervision/Assistance - 24 hour    Equipment Recommendations  3 in 1 bedside comode    Recommendations for Other Services      Precautions / Restrictions Precautions Precautions: Fall Precaution Comments: trach peg       Mobility Bed Mobility               General bed mobility comments: sitting eOB on arrival  Transfers Overall transfer level: Needs assistance   Transfers: Sit to/from Stand Sit to Stand: Supervision         General transfer comment: (A) to manage lines and leads. pt currently without panda tubing due to dc of panda noted in notes AM 10/10/15    Balance Overall balance assessment: Needs assistance         Standing balance support: Bilateral upper extremity supported;During functional activity Standing balance-Leahy Scale: Fair                     ADL       Grooming: Dance movement psychotherapist;Wash/dry hands;Set up;Sitting                                 General ADL Comments: pt wanting to walk. OT providing oxygen tank and trach attachment to ambulate with iv pole/ oxygen. pt ambulated ~150 feet requiring two rest breaks. Pt with forward flexed posture on RW and educated on standing  up tall to help breathing. pt very excited to exit the room this session. RN educated on hooking up trach tubing for mobility if patient wants to mobilize again this admission.      Vision                     Perception     Praxis      Cognition   Behavior During Therapy: WFL for tasks assessed/performed Overall Cognitive Status: Within Functional Limits for tasks assessed                       Extremity/Trunk Assessment               Exercises     Shoulder Instructions       General Comments      Pertinent Vitals/ Pain       Pain Assessment: No/denies pain  Home Living                                          Prior Functioning/Environment              Frequency Min 2X/week  Progress Toward Goals  OT Goals(current goals can now be found in the care plan section)  Progress towards OT goals: Progressing toward goals  Acute Rehab OT Goals Patient Stated Goal: to go home OT Goal Formulation: With patient Time For Goal Achievement: 10/21/15 Potential to Achieve Goals: Good  Plan Discharge plan remains appropriate    Co-evaluation                 End of Session Equipment Utilized During Treatment: Oxygen;Gait belt;Rolling walker   Activity Tolerance Patient tolerated treatment well   Patient Left in bed;with call bell/phone within reach   Nurse Communication Mobility status;Precautions        Time: [id number] OT Time Calculation (min): 26 min  Charges: OT General Charges $OT Visit: 1 Procedure OT Treatments $Self Care/Home Management : 8-22 mins $Therapeutic Activity: 8-22 mins  Parke Poisson B 10/10/2015, 1:30 PM   Jeri Modena   OTR/L Pager: [phone] Office: [phone] .

## 2015-10-10 NOTE — Progress Notes (Addendum)
Pt's NG tube came out, will have to get replaced by IR due to pt's hx of laryngeal mass and gastric ulcers, MD notified, will continue to monitor, Thanks Arvella Nigh RN

## 2015-10-10 NOTE — Progress Notes (Signed)
PARENTERAL NUTRITION CONSULT NOTE - FOLLOW UP  Pharmacy Consult:  TPN Indication:  Dysphagia/laryngeal and recent gastric/duodenal ulcers  No Known Allergies  Patient Measurements: Height: '5\' 3"'$  (160 cm) (Simultaneous filing. User may not have seen previous data.) Weight: 193 lb 12.8 oz (87.907 kg) IBW/kg (Calculated) : 52.4  Vital Signs: Temp: 98.6 F (37 C) (02/27 0429) Temp Source: Oral (02/27 0429) BP: 127/70 mmHg (02/27 0429) Pulse Rate: 95 (02/27 0429) Intake/Output from previous day: 02/26 0701 - 02/27 0700 In: 550 [NG/GT:300; IV Piggyback:250] Out: 1275 [Urine:1275] Intake/Output from this shift: Total I/O In: 0  Out: 250 [Urine:250]  Labs:  Recent Labs  10/08/15 0525 10/09/15 1050 10/10/15 0501  WBC 14.4* 16.7* 15.0*  HGB 7.6* 9.4* 9.0*  HCT 25.2* 29.5* 29.3*  PLT 204 199 202     Recent Labs  10/08/15 0525 10/09/15 1050 10/10/15 0502  NA 143 144 145  K 4.1 3.7 3.4*  CL 104 102 102  CO2 '29 29 31  '$ GLUCOSE 207* 244* 267*  BUN '8 7 7  '$ CREATININE 0.87 0.85 0.89  CALCIUM 8.0* 8.7* 8.7*  PHOS 3.3  --   --   ALBUMIN 1.8*  --   --    Estimated Creatinine Clearance: 71.6 mL/min (by C-G formula based on Cr of 0.89).    Recent Labs  10/10/15 0020 10/10/15 0426 10/10/15 0752  GLUCAP 223* 228* 146*     Insulin Requirements in the past 24 hours:  16 units SSI  Assessment: 33 YOM on TPN PTA for dysphagia/laryngeal and recent gastric and duodenal ulcers unable to have PEG acutely.  Patient's TPN was placed on hold since 09/25/15 due to bacteremia and removal of IV access.  Patient has been tolerating tube feeding despite Panda tube coming out twice in 4 days.  Pharmacy consulted to resume TPN on 10/10/15.  Per MD, patient to be discharged home tomorrow and risk of patient coming to the ED daily due to dislodging feeding tube is high.  GI: on TPN PTA for dysphagia, unable to place PEG now d/t recent GIB.  Baseline prealbumin 5.2.  PPI IV, +BM Endo:  thyroid dz.  DM2 - CBGs elevated Lytes: K+ 3.4, others WNL  Renal: SCr improved to 0.89, CrCL 72 ml/min - good UOP 0.6 ml/kg/hr, free water (388m q6h) Hepatic: 2/12 labs -  LFTs WNL except alk phos at 246.  TG elevated at 271 Cards: HTN - VSS Pulm: trach 09/04/15, FiO2 28% ID: Ampicillin/Fluc for Enterococcus bacteremia, HCAP vs tracheobronchitis - afebrile, WBC improving Best Practices: SCDs TPN access: PICC placed 10/04/15 TPN start date: TPN PTA >> 2/21, resumed 2/27 >>  Home TPN: 1894 kCal and 100gm protein, no insulin  Current Nutrition:  NPOjavascript:;  Nutritional Goals: 1900-2100 kCal and 85-100 gm protein per day   Plan:  - Clinimix E 5/15 at 40 ml/hr + ILE 20% at 5 ml/hr - Daily multivitamin and trace elements in TPN - Change SSI to moderate Q4H given elevated CBGs.  May need to add insulin to TPN. - KCL x 3 runs - Mag sulfate 1gm IV x 1 - KPhos 10 mmol IV x 1 - TPN labs in AM   Forrestine Lecrone D. DMina Marble PharmD, BCPS Pager:  [phone]/27/2017, 11:02 AM

## 2015-10-10 NOTE — Progress Notes (Signed)
RT unavailable to do 0800 trach check.

## 2015-10-10 NOTE — Progress Notes (Signed)
Pt is stable at this time satting 100% on 28% ATC. No complications or distress noted.

## 2015-10-10 NOTE — Progress Notes (Signed)
Notified Dr. Broadus John pt's NGT came out early this am and has order to replace it and radiology asking for ativan to help her relax.  Instructed she will start her on TNA instead and to cancel order.  Informed Baxter Flattery in radiology.  Karie Kirks, Therapist, sports.

## 2015-10-11 ENCOUNTER — Encounter: Payer: Self-pay | Admitting: Physician Assistant

## 2015-10-11 DIAGNOSIS — R1310 Dysphagia, unspecified: Secondary | ICD-10-CM

## 2015-10-11 DIAGNOSIS — R0902 Hypoxemia: Secondary | ICD-10-CM

## 2015-10-11 LAB — PREALBUMIN: Prealbumin: 17.1 mg/dL — ABNORMAL LOW (ref 18–38)

## 2015-10-11 LAB — DIFFERENTIAL
BASOS ABS: 0 10*3/uL (ref 0.0–0.1)
Basophils Relative: 0 %
EOS ABS: 0.3 10*3/uL (ref 0.0–0.7)
Eosinophils Relative: 2 %
LYMPHS ABS: 2.4 10*3/uL (ref 0.7–4.0)
Lymphocytes Relative: 14 %
MONOS PCT: 3 %
Monocytes Absolute: 0.6 10*3/uL (ref 0.1–1.0)
NEUTROS ABS: 14.1 10*3/uL — AB (ref 1.7–7.7)
NEUTROS PCT: 81 %

## 2015-10-11 LAB — GLUCOSE, CAPILLARY
GLUCOSE-CAPILLARY: 142 mg/dL — AB (ref 65–99)
GLUCOSE-CAPILLARY: 143 mg/dL — AB (ref 65–99)
GLUCOSE-CAPILLARY: 146 mg/dL — AB (ref 65–99)

## 2015-10-11 LAB — CBC
HEMATOCRIT: 31.4 % — AB (ref 36.0–46.0)
HEMOGLOBIN: 9.3 g/dL — AB (ref 12.0–15.0)
MCH: 26.3 pg (ref 26.0–34.0)
MCHC: 29.6 g/dL — AB (ref 30.0–36.0)
MCV: 88.7 fL (ref 78.0–100.0)
Platelets: 210 10*3/uL (ref 150–400)
RBC: 3.54 MIL/uL — AB (ref 3.87–5.11)
RDW: 16 % — ABNORMAL HIGH (ref 11.5–15.5)
WBC: 17.4 10*3/uL — AB (ref 4.0–10.5)

## 2015-10-11 LAB — COMPREHENSIVE METABOLIC PANEL
ALBUMIN: 2.2 g/dL — AB (ref 3.5–5.0)
ALK PHOS: 105 U/L (ref 38–126)
ALT: 14 U/L (ref 14–54)
ANION GAP: 8 (ref 5–15)
AST: 14 U/L — ABNORMAL LOW (ref 15–41)
BUN: 11 mg/dL (ref 6–20)
CALCIUM: 9.1 mg/dL (ref 8.9–10.3)
CHLORIDE: 106 mmol/L (ref 101–111)
CO2: 31 mmol/L (ref 22–32)
Creatinine, Ser: 0.95 mg/dL (ref 0.44–1.00)
GFR calc non Af Amer: >60 mL/min (ref >60)
GLUCOSE: 174 mg/dL — AB (ref 65–99)
POTASSIUM: 3.7 mmol/L (ref 3.5–5.1)
SODIUM: 145 mmol/L (ref 135–145)
Total Bilirubin: 0.5 mg/dL (ref 0.3–1.2)
Total Protein: 7 g/dL (ref 6.5–8.1)

## 2015-10-11 LAB — TRIGLYCERIDES: Triglycerides: 171 mg/dL — ABNORMAL HIGH (ref <150)

## 2015-10-11 LAB — MAGNESIUM: MAGNESIUM: 2 mg/dL (ref 1.7–2.4)

## 2015-10-11 LAB — PHOSPHORUS: PHOSPHORUS: 4.7 mg/dL — AB (ref 2.5–4.6)

## 2015-10-11 MED ORDER — POTASSIUM CHLORIDE 10 MEQ/50ML IV SOLN
10.0000 meq | INTRAVENOUS | Status: DC
  Filled 2015-10-11 (×3): qty 50

## 2015-10-11 MED ORDER — FAT EMULSION 20 % IV EMUL
240.0000 mL | INTRAVENOUS | Status: DC
  Filled 2015-10-11: qty 250

## 2015-10-11 MED ORDER — NONFORMULARY OR COMPOUNDED ITEM: Status: AC

## 2015-10-11 MED ORDER — SODIUM CHLORIDE 0.9 % IV SOLN: 2.0000 g | INTRAVENOUS | Status: AC

## 2015-10-11 MED ORDER — PANTOPRAZOLE SODIUM 40 MG IV SOLR: 40.0000 mg | Freq: Every day | INTRAVENOUS | Status: DC

## 2015-10-11 MED ORDER — FLUCONAZOLE IN SODIUM CHLORIDE 400-0.9 MG/200ML-% IV SOLN: 400.0000 mg | INTRAVENOUS | Status: AC

## 2015-10-11 MED ORDER — SODIUM CHLORIDE 0.9 % IV SOLN: 2.0000 g | Freq: Four times a day (QID) | INTRAVENOUS | Status: DC

## 2015-10-11 MED ORDER — HYDROCODONE-ACETAMINOPHEN 5-325 MG PO TABS: 1.0000 | ORAL_TABLET | Freq: Four times a day (QID) | ORAL | Status: DC | PRN

## 2015-10-11 MED ORDER — TRACE MINERALS CR-CU-MN-SE-ZN 10-1000-500-60 MCG/ML IV SOLN
INTRAVENOUS | Status: DC
  Filled 2015-10-11: qty 1992

## 2015-10-11 MED ORDER — HEPARIN SOD (PORK) LOCK FLUSH 100 UNIT/ML IV SOLN
250.0000 [IU] | INTRAVENOUS | Status: AC | PRN
  Administered 2015-10-11: 250 [IU]

## 2015-10-11 NOTE — Progress Notes (Signed)
PARENTERAL NUTRITION CONSULT NOTE - FOLLOW UP  Pharmacy Consult:  TPN Indication:  Dysphagia/laryngeal and recent gastric/duodenal ulcers  No Known Allergies  Patient Measurements: Height: '5\' 3"'$  (160 cm) (Simultaneous filing. User may not have seen previous data.) Weight: 192 lb 11.2 oz (87.408 kg) IBW/kg (Calculated) : 52.4  Vital Signs: Temp: 98.3 F (36.8 C) (02/28 0339) Temp Source: Oral (02/28 0339) BP: 139/72 mmHg (02/28 0339) Pulse Rate: 83 (02/28 0904) Intake/Output from previous day: 02/27 0701 - 02/28 0700 In: 792.3 [IV Piggyback:753.3; TPN:38.9] Out: 650 [Urine:650]  Labs:  Recent Labs  10/09/15 1050 10/10/15 0501 10/11/15 0330  WBC 16.7* 15.0* 17.4*  HGB 9.4* 9.0* 9.3*  HCT 29.5* 29.3* 31.4*  PLT 199 202 210     Recent Labs  10/09/15 1050 10/10/15 0500 10/10/15 0502 10/11/15 0330  NA 144  --  145 145  K 3.7  --  3.4* 3.7  CL 102  --  102 106  CO2 29  --  31 31  GLUCOSE 244*  --  267* 174*  BUN 7  --  7 11  CREATININE 0.85  --  0.89 0.95  CALCIUM 8.7*  --  8.7* 9.1  MG  --  1.7  --  2.0  PHOS  --  2.7  --  4.7*  PROT  --   --   --  7.0  ALBUMIN  --   --   --  2.2*  AST  --   --   --  14*  ALT  --   --   --  14  ALKPHOS  --   --   --  105  BILITOT  --   --   --  0.5  PREALBUMIN  --   --   --  17.1*  TRIG  --   --   --  171*   Estimated Creatinine Clearance: 66.8 mL/min (by C-G formula based on Cr of 0.95).    Recent Labs  10/10/15 1949 10/11/15 0003 10/11/15 0342  GLUCAP 147* 142* 146*     Insulin Requirements in the past 24 hours:  11 units SSI  Assessment: 89 YOM on TPN PTA for dysphagia/laryngeal and recent gastric and duodenal ulcers unable to have PEG acutely.  Patient's TPN was placed on hold since 09/25/15 due to bacteremia and removal of IV access.  Patient has been tolerating tube feeding despite Panda tube coming out twice in 4 days.  Pharmacy consulted to resume TPN on 10/10/15.  Per MD, patient to be discharged home  soon and risk of patient coming to the ED daily due to dislodging feeding tube is high.  GI: on TPN PTA for dysphagia, unable to place PEG now d/t recent GIB.  Baseline prealbumin 5.2, now improved to 17.1.  PPI IV, +BM Endo: thyroid dz.  DM2 - CBGs elevated while on TF, currently controlled Lytes: CoCa high normal at 10.54 (Ca x Phos = 50, goal < 55) Renal: SCr 0.95, CrCL 67 ml/min - low UOP 0.3 ml/kg/hr, free water (357m q6h, received none) Hepatic: 2/12 labs -  LFTs WNL except alk phos at 246.  TG 271 >> 171 Cards: HTN - BP controlled, HR variable Pulm: trach 09/04/15 - FiO2 28%. Has thick, pink tinged sticky mucous plug ID: Ampicillin/Fluc for Enterococcus bacteremia, HCAP vs tracheobronchitis - afebrile, WBC up to 17.4 Best Practices: SCDs TPN access: PICC placed 10/04/15 TPN start date: TPN PTA >> 2/21, resumed 2/27 >>  Home TPN: 1894 kCal and  100gm protein, no insulin  Current Nutrition:  TPN  Nutritional Goals: 1900-2100 kCal and 85-100 gm protein per day   Plan:  - Increase Clinimix 5/15 to 83 ml/hr + ILE 20% to 10 ml/hr.  Remove electrolytes from TPN today to allow Ca-Phos product to trend down.  TPN will provide 1894 kCal and 100gm protein daily, meeting ~100% of patient's needs. - Daily multivitamin and trace elements in TPN - Continue moderate SSI Q4H.  May need to add insulin to TPN. - KCL x 3 runs since new TPN bag tonight will not contain electrolytes - Cycle TPN once stable at goal TPN rate  - F/U AM labs if not discharged   Kashlyn Salinas D. Mina Marble, PharmD, BCPS Pager:  [phone] 10/11/2015, 9:53 AM

## 2015-10-11 NOTE — Progress Notes (Signed)
Speech Language Pathology Treatment: Nada Boozer Speaking valve  Patient Details Name: Ashley Ramsey MRN: [id number] DOB: Sep 01, 1955 Today's Date: 10/11/2015 Time: [id number] SLP Time Calculation (min) (ACUTE ONLY): 17 min  Assessment / Plan / Recommendation Clinical Impression  Pt able to place and remove PMSV with use of mirror given min assist. PMSV donned and tolerated throughout session without changes in vital signs. RR, pulse and SpO2 remained WFL. Pt able to recall Mare Ferrari free water protocol requirements (oral care before H2O or ice chips, no PO 30 minutes after oral care) with min verbal cues provided by SLP. Pt educated re: PMSV use and Mare Ferrari free water protocol. SLP will f/u to increase competence of PMSV.   HPI HPI: 60 y.o. female with h/o laryngeal cancer, s/p trach, type 2 DM, who presented to ED due to confusion. Pt problem list includes: severe sepsis possibly due to LLL HCAP, infection in PICC line. Last seen by SLP 1/31 for PMSV tx. Most recent MBSS 1/26 recommended NPO status except for free water protocol under strict guidelines. Pt remained NPO at d/c last month, with nutrition via TPN. Pt reported PMSV use at home as well as PO intake.      SLP Plan  Continue with current plan of care     Recommendations  Diet recommendations: NPO Medication Administration: Via alternative means Supervision: Full supervision/cueing for compensatory strategies (with water protocol) Postural Changes and/or Swallow Maneuvers: Seated upright 90 degrees      Patient may use Passy-Muir Speech Valve: During all waking hours (remove during sleep) PMSV Supervision: Intermittent      Oral Care Recommendations: Oral care QID;Oral care prior to ice chip/H20 Follow up Recommendations: Home health SLP Plan: Continue with current plan of care     GO                Titus Mould 10/11/2015, 12:34 PM  Titus Mould, Student-SLP

## 2015-10-11 NOTE — Progress Notes (Signed)
Received call from her daughter Charliene, patient will be returning to [address] [place] [address]. She is on her way to the hospital to receive discharge instructions with the patient. Soc Worker is to arrange ambulance transportation; [facility] [phone]

## 2015-10-11 NOTE — Progress Notes (Signed)
Trach care done. . Pt had a thick clear pink tinged sticky mucous plug in the IC lumen. Pt IC was cleaned with NS and dried and reapplied and lock into place in her trach.  Pt was stable throughout, breath sounds are clear and diminished. No distress noted. Pt is satting 100% on a ATC 28% FiO2. Pt sitting and resting on side of the bed.

## 2015-10-11 NOTE — Progress Notes (Signed)
Patient to be discharged home today. VM left with daughter April to verify correct address.  TCT patients home, spoke to [place] grandson Darlyn Chamber he stated that she was returning to [address] [place] [address].  Daughters - Jaquita Folds [phone] ; [phone], April [phone].

## 2015-10-11 NOTE — Discharge Summary (Addendum)
Physician Discharge Summary  Ashley Ramsey [id number] DOB: 09-27-1955 DOA: 09/23/2015  PCP: Ashley Morale, MD  Admit date: 09/23/2015 Discharge date: 10/11/2015  Time spent: 60 minutes  Recommendations for Outpatient Follow-up:  Follow-up Information    Follow up with Ashley Ramsey On 10/13/2015.   Why:  Appointment on 3/21/7 at 10:30 am with Dr. Jarold Ramsey.   Contact information:   [address] [place] [phone]      Follow up with Ashley Ramsey.   Why:  They will do your home health care at your home   Contact information:   [address] [place] [address] [phone]       Follow up with Ashley Doheny, MD. Schedule an appointment as soon as possible for a visit in 2 weeks. I called Ashley Ramsey's office today, they will call pt with appt   Specialty:  Plastic Surgery   Why:  for definitive surgery for Laryngeal Cancer   Contact information:   [place] [address] [phone]       Follow up with Ashley Ramsey, Ashley Barley, PA-C. On 3/14, re-eval for PEG tube   Specialty:  Gastroenterology   Contact information:   Ashley Ramsey [address] [phone]       Discharge Diagnoses:     Severe sepsis ([place])    Enterococcal bacteremia   Invasive laryngeal SCC   Dysphagia   S/p Tracheostomy   Candida albicans fungemia   Type 2 diabetes mellitus without complication, without long-term current use of insulin (HCC)   AKI (acute kidney injury) ([place])   HCAP (healthcare-associated pneumonia)   DM2 (diabetes mellitus, type 2) (HCC)   Pressure ulcer   Hypoxia   Dysphagia, pharyngeal phase   Duodenal ulcer hemorrhage   Discharge Condition: stable  Diet recommendation: NPO, TNA via PICC  Filed Weights   10/09/15 0404 10/10/15 0429 10/11/15 0339  Weight: 87.771 kg (193 lb 8 oz) 87.907 kg (193 lb 12.8 oz) 87.408 kg (192 lb 11.2 oz)    History of present  illness:  Chief Complaint: Leukocytosis HPI: Ashley Ramsey is a 60 y.o. female with h/o laryngeal cancer, s/p trach, currently on TPN for nutrition. All of this occurred during a Jan 16th admission. Patient apparently has been declining in general since time of discharge but is especially more confused few days prior to admission. WBC increased to 28k , Patient was sent to ED for eval  Hospital Course:  Enterococcal bacteremia and Candida albicans fungemia -Felt to be PICC related -PICC removed 09/25/13 -Clinically improved -Was initially on broad spectrum Abx, then transitioned to IV Ampicillin and fluconazole per Infectious Disease recommendations for 1 month -Repeat blood culture - NGTD from 09/28/2015, stop date for IV ampicillin and Fluconazole is 10/26/15 -2-D echocardiogram complete, no evidence of vegetation -PICC Replaced 2/21 -remains stable and afebrile now  Dysphagia -Patient has dysphagia, recommended to be NPO, has a 5 cm subglottic mass and tracheostomy since last admission in Dec-January -She was getting TNA via PICC, then PICC line was discontinued for line holiday. -The PEG tube was not placed during last hospital stay because of presence of duodenal ulcer on 09/03/2013 EGD.  -GI (AshleyHung) initially recommended general surgery eval, general surgery recommended GI to re-evaluate via EGD. -GI AshleyDanis with [facility] evaluated her on 2/17, AshleyElmahi had discussion with Dr. Loletha Ramsey, he recommended interventional radiology. -IR recommended to hold off PEG tube for at least 6  weeks because of recent GI bleed and embolization procedure,  -then started on Tube feeds via Panda tube, panda pulled out twice this week. -On 2/24 I called and d/w IR AshleyArt Ramsey, regarding PEG tube down the road (mid march, 6weeks from embolization) , he said she would need an EGD before this could be considered and PEG via endoscopy would be the right way to proceed rather than blind by IR, d/w Gi  AshleyArmbruster again who consulted on 2/24, recommended to continue Panda tube feeds for now and will re-eval down the road for G tube. -since then [facility] tubed pulled out by patient twice in 4days, since seen and evaluated by multiple GI physicians in our health system and unwilling to place a PEG tube and the dangers with discharging her with a Panda tube, i elected to resume TNA via PICC line due to lack of options and will ask her to FU with GI in few weeks to re-evaluate this by which time she would have completed her IV abx and antifungal course for sepsis/bacteremia. -Her family has been educated and [name] service will manage her TNA  Anemia  -Acute on chronic, multifactorial  -2 weeks ago found to have proximal stomach and duodenal ulcer, status post EGD on 09/04/2015, then required Gastroduodenal Artery coiling/embolization by IR on 09-07-15, required 5units PRBC then -Keep on PPI, Transfused 2 units this admission, had some dark stools earlier this admission, now resolved -hb down to 7.6 2/25, transfused 1 unit of PRBC -Hb stable since  Invasive laryngeal SCC, suspicious lymph nodes involvement per ENT -Seen by ENT & oncology last month, underwent laryngoscopy/ biopsy on 1/20, about 5.4 subglottic mass. -Followed by AshleyRosen and he recommended FU with ENT Dr. Fenton Ramsey at [facility] for Eval for Surgery -s/p tracheostomy placement by ENT on 09/05/2015 -Seen by Oncology AshleyGorsuch and Rad Onc, and Palliative consult completed -Pt and family wants full aggressive scope of Rx -per AshleyGorsuch, Patient's prognosis is poor, poor candidate for chemotherapy, also most likely has renal cancer.  -No PEG tube placed last admission due to duodenal ulcers -Was on TNA via PICC line -Called and d/w AshleyGorsuch 2/13, she recommended Hospice, unfortunately pt and family want full Aggressive scope of Rx. -Patient should follow with Ashley Ramsey for her laryngeal cancer, i  called and sent another referral to Ashley Ramsey at the [facility] via the physician referral line  Left renal mass: Highly suspicious for renal cell carcinoma.  -Case d/w Dr. Ree Ramsey last admission, recommended outpatient follow-up with him in the office for consideration of nephrectomy in the future  DM -SSI for now  Morbid obesity:  -nutrition via tube feeds now  Hypernatremia -improving, on d5W -stop d5w and add free water   Consultations: [facility] Interventional Radiology Infectious disease C[facility]  Discharge Exam: Filed Vitals:   10/11/15 0904 10/11/15 1217  BP:    Pulse: 83 94  Temp:    Resp: 16 16    General: AAOx3 Cardiovascular: S1S2/RRR Respiratory: CTAB  Discharge Instructions   Discharge Instructions    Discharge instructions    Complete by:  As directed   Continue IV Antibiotics and Antifungals till 3/15 Continue IV nutrition/TNA via PICC     Increase activity slowly    Complete by:  As directed           Current Discharge Medication List    START taking these medications   Details  ampicillin 2 g in sodium chloride 0.9 %  50 mL Inject 2 g into the vein every 4 (four) hours. Qty: 2 g, Refills: 0    fluconazole (DIFLUCAN) 400-0.9 MG/200ML-% IVPB Inject 200 mLs (400 mg total) into the vein daily. Until 3/15 Qty: 200 mL, Refills: 0    HYDROcodone-acetaminophen (NORCO/VICODIN) 5-325 MG tablet Take 1 tablet by mouth every 6 (six) hours as needed for moderate pain. Qty: 30 tablet, Refills: 0    pantoprazole (PROTONIX) 40 MG injection Inject 40 mg into the vein daily. Qty: 1 each, Refills: 0      CONTINUE these medications which have CHANGED   Details  NONFORMULARY OR COMPOUNDED ITEM TPN with electrolytes and Fat emulsion per [facility] by protocol via PICC Qty: 1 each, Refills: 0      CONTINUE these medications which have NOT CHANGED   Details  acetaminophen (TYLENOL) 325 MG suppository Place 1 suppository (325 mg total)  rectally every 4 (four) hours as needed for mild pain. Qty: 30 suppository, Refills: 0    albuterol (PROVENTIL) (2.5 MG/3ML) 0.083% nebulizer solution Take 3 mLs (2.5 mg total) by nebulization every 6 (six) hours as needed for wheezing or shortness of breath. Qty: 75 mL, Refills: 12    insulin aspart (NOVOLOG) 100 UNIT/ML injection Before each meal 3 times a day, 140-199 - 2 units, 200-250 - 4 units, 251-299 - 6 units,  300-349 - 8 units,  350 or above 10 units. Dispense syringes and needles as needed, Ok to switch to PEN if approved. Substitute to any brand approved. DX DM2, Code E11.65 Qty: 1 vial, Refills: 12    Blood Glucose Monitoring Suppl (TRUE METRIX METER) DEVI 1 each by Does not apply route 3 (three) times daily before meals. Qty: 1 Device, Refills: 0   Associated Diagnoses: Type 2 diabetes mellitus without complication, without long-term current use of insulin ([place])    !! glucose blood (FREESTYLE LITE) test strip For glucose testing every before meals at bedtime. Diagnosis E 11.65  Can substitute to any accepted brand Qty: 100 each, Refills: 0    !! glucose blood (TRUE METRIX BLOOD GLUCOSE TEST) test strip Use 3 times daily before meals Qty: 100 each, Refills: 12   Associated Diagnoses: Type 2 diabetes mellitus without complication, without long-term current use of insulin (HCC)    TRUEPLUS LANCETS 28G MISC 1 each by Does not apply route 3 (three) times daily before meals. Qty: 100 each, Refills: 12   Associated Diagnoses: Type 2 diabetes mellitus without complication, without long-term current use of insulin ([place])     !! - Potential duplicate medications found. Please discuss with provider.     No Known Allergies Follow-up Information    Follow up with [facility] On 10/13/2015.   Why:  Appointment on 3/21/7 at 10:30 am with Dr. Jarold Ramsey.   Contact information:   [address] [place] [phone]      Follow  up with [facility].   Why:  They will do your home health care at your home   Contact information:   [address] [place] [address] [phone]       Follow up with Ashley Doheny, MD. Schedule an appointment as soon as possible for a visit in 2 weeks.   Specialty:  Plastic Surgery   Why:  for definitive surgery for Laryngeal Cancer   Contact information:   [place] [address] [phone]       Follow  up with Ashley Ramsey, Ashley Barley, PA-C On 10/26/2015.   Specialty:  Gastroenterology   Why:  [facility] for evaluation for PEG tube   Contact information:   [place] [address] [phone]        The results of significant diagnostics from this hospitalization (including imaging, microbiology, ancillary and laboratory) are listed below for reference.    Significant Diagnostic Studies: Dg Chest 2 View  09/12/2015  CLINICAL DATA:  Tracheostomy, cough, shortness of breath EXAM: CHEST  2 VIEW COMPARISON:  CT chest 08/29/2015 FINDINGS: There is a tracheostomy tube in satisfactory position. There is no focal parenchymal opacity. There is no pleural effusion or pneumothorax. The heart and mediastinal contours are unremarkable. The osseous structures are unremarkable. IMPRESSION: No active cardiopulmonary disease. Electronically Signed   By: Kathreen Devoid   On: 09/12/2015 09:07   Dg Abd 1 View  10/07/2015  CLINICAL DATA:  60 year old female undergoing fluoroscopic guided feeding tube placement. Initial encounter. EXAM: ABDOMEN - 1 VIEW COMPARISON:  10/03/2015. FLUOROSCOPY TIME:  7 minutes 0 seconds FINDINGS: Single fluoroscopic image demonstrating feeding tube placement to the distal duodenum. Contrast injection opacifies the distal duodenum and proximal small bowel. 20 mL of water-soluble contrast Omnipaque 300 was utilized. IMPRESSION: Fluoroscopic guided feeding tube placement with tip at the distal duodenum.  Electronically Signed   By: Genevie Ann M.D.   On: 10/07/2015 16:04   Dg Abd 1 View  10/03/2015  CLINICAL DATA:  Feeding tube placement. EXAM: ABDOMEN - 1 VIEW COMPARISON:  None. FINDINGS: A feeding tube is seen in place with the tip overlying the distal duodenum near the ligament of Treitz. This is confirmed by injection of contrast and feeding tube which is seen filling the proximal jejunum. IMPRESSION: Feeding tube tip overlying distal duodenum near the ligament of Treitz. Electronically Signed   By: Earle Gell M.D.   On: 10/03/2015 09:27   Dg Chest Port 1 View  10/04/2015  CLINICAL DATA:  Evaluate central line placement EXAM: PORTABLE CHEST 1 VIEW COMPARISON:  09/25/2015 FINDINGS: Right upper extremity PICC. Tip placement is indeterminate due to underpenetration and artifact from EKG leads. The tip is the level of the right atrium, depth uncertain. Would retracted by 3-4 cm. New feeding tube with tip at least reaching the stomach. Tracheostomy tube is well seated. Improved inflation compared to prior. No pneumonia or edema. IMPRESSION: Right upper extremity PICC with tip at the right atrium, depth uncertain due to technical factors. Suggest retraction by 3-4 cm and repeat radiograph. Electronically Signed   By: Monte Fantasia M.D.   On: 10/04/2015 14:47   Dg Chest Port 1 View  09/25/2015  CLINICAL DATA:  60 year old with recent diagnosis of laryngeal cancer with indwelling tracheostomy, presenting 2 days ago with sepsis. Worsening hypoxia acutely today. EXAM: PORTABLE CHEST 1 VIEW COMPARISON:  09/23/2015 dating back to 08/29/2015. FINDINGS: Tracheostomy tube in satisfactory position below the thoracic inlet. Markedly suboptimal inspiration with atelectasis in the lung bases. Cardiac silhouette mildly enlarged even allowing for technique and degree of inspiration. Pulmonary venous hypertension and minimal to mild interstitial pulmonary edema, new since 2 days ago. Streaky and patchy opacity at the left  lung base. No confluent consolidation elsewhere. Note is made of slight inferior subluxation of the left humeral head relative to the glenoid, not seen on prior examinations, though moderate to severe degenerative changes are present in the left glenohumeral joint. IMPRESSION: 1. Markedly suboptimal inspiration with bibasilar atelectasis. Possible developing bronchopneumonia at  the left lung base. 2. Stable cardiomegaly. Pulmonary venous hypertension with minimal to mild interstitial pulmonary edema, query fluid overload. 3. Slight inferior subluxation of the left glenohumeral joint, associated with moderate to severe degenerative changes. Electronically Signed   By: Evangeline Dakin M.D.   On: 09/25/2015 18:12   Dg Chest Port 1 View  09/23/2015  CLINICAL DATA:  60 year old female with shortness of breath and cough EXAM: PORTABLE CHEST 1 VIEW COMPARISON:  Radiograph dated 09/14/2015 FINDINGS: Right-sided PICC in stable positioning. Tracheostomy with tip above the carina in stable positioning. Single-view of the chest demonstrate a focal area of hazy density at the left lung base, likely atelectatic changes. Pneumonia is less likely but not excluded. Clinical correlation is recommended. The lungs are hypovolemic. No pleural effusion or pneumothorax. Stable cardiac silhouette. No acute osseous pathology. IMPRESSION: Minimal left lung base atelectatic changes versus less likely pneumonia. Clinical correlation recommended. Electronically Signed   By: Anner Crete M.D.   On: 09/23/2015 18:30   Dg Chest Portable 1 View  09/14/2015  CLINICAL DATA:  Shortness of breath tonight. EXAM: PORTABLE CHEST 1 VIEW COMPARISON:  Frontal and lateral views 2 days prior 09/12/2015 FINDINGS: Tracheostomy tube at the thoracic inlet. Tip of the right upper extremity PICC in the mid-distal SVC. Cardiomediastinal contours are unchanged. Mild bibasilar atelectasis. No consolidation to suggest pneumonia. No large pleural effusion or  pneumothorax. IMPRESSION: 1. Tracheostomy tube in right upper extremity PICC in place. 2. Mild bibasilar atelectasis. Electronically Signed   By: Jeb Levering M.D.   On: 09/14/2015 03:52   Dg Addison Bailey G Tube Plc W/fl-no Rad  10/07/2015  CLINICAL DATA:  NASO G TUBE PLACEMENT WITH FLUORO Fluoroscopy was utilized by the requesting physician.  No radiographic interpretation.   Dg Naso G Tube Plc W/fl-no Rad  10/03/2015  CLINICAL DATA:  NASO G TUBE PLACEMENT WITH FLUORO Fluoroscopy was utilized by the requesting physician.  No radiographic interpretation.   Dg Swallowing Func-speech Pathology  10/01/2015  Objective Swallowing Evaluation: Type of Study: MBS-Modified Barium Swallow Study Patient Details Name: JAMEEKA DEEMS MRN: [id number] Date of Birth: 07/18/1956 Today's Date: 10/01/2015 Time: SLP Start Time (ACUTE ONLY): 1021-SLP Stop Time (ACUTE ONLY): 1050 SLP Time Calculation (min) (ACUTE ONLY): 29 min Past Medical History: Past Medical History Diagnosis Date . Thyroid disease  . Hypertension  . Neck mass hospitalized 08/29/2015 . Type II diabetes mellitus ([facility])  . Left kidney mass  . Anemia, chronic disease  Past Surgical History: Past Surgical History Procedure Laterality Date . No past surgeries   . Esophagogastroduodenoscopy N/A 09/04/2015   Procedure: ESOPHAGOGASTRODUODENOSCOPY (EGD);  Surgeon: Manus Gunning, MD;  Location: [facility];  Service: Gastroenterology;  Laterality: N/A; . Direct laryngoscopy N/A 09/02/2015   Procedure: DIRECT LARYNGOSCOPY WITH BIOPSY;  Surgeon: Izora Gala, MD;  Location: [facility];  Service: ENT;  Laterality: N/A; . Esophagoscopy N/A 09/02/2015   Procedure: ESOPHAGOSCOPY;  Surgeon: Izora Gala, MD;  Location: [facility];  Service: ENT;  Laterality: N/A; . Tracheostomy tube placement N/A 09/04/2015   Procedure: TRACHEOSTOMY;  Surgeon: Izora Gala, MD;  Location: [facility];  Service: ENT;  Laterality: N/A; . Direct laryngoscopy  09/04/2015   Procedure: DIRECT View LARYNGOSCOPY with  cautery of of tumor;  Surgeon: Izora Gala, MD;  Location: [facility];  Service: ENT;; HPI: 60 y.o. female with h/o laryngeal cancer, s/p trach, type 2 DM, who presented to ED due to confusion. Pt problem list includes: severe sepsis possibly due to LLL HCAP, infection in  PICC line. Last seen by SLP 1/31 for PMSV tx. Most recent MBSS 1/26 recommended NPO status except for free water protocol under strict guidelines. Pt remained NPO at d/c last month, with nutrition via TPN. Pt reported PMSV use at home as well as PO intake. Subjective: pt alert, eager for food/drink Assessment / Plan / Recommendation CHL IP CLINICAL IMPRESSIONS 10/01/2015 Therapy Diagnosis Moderate pharyngeal phase dysphagia;Severe pharyngeal phase dysphagia Clinical Impression MBS complete. Results generally consistent with most recent MBS results 09/08/15 with potentially slightly greater dysfunction. She continues to exhibit a moderate-severe anatomically based pharyngeal dysphagia resulting in sensory and motor deficits. Primary deficit appears to be decreased laryngeal closure, likely related to laryngeal mass,  and pharyngeal residuals resulting in poor airway protection both during and post swallow with all consistencies assessed. Cough present but weak and unsuccessful to clear the airway further increasing risk of an aspiration related infection. Rehabilitation of swallow function prior to intervention for tumor guarded due to current size/location. Long term non-oral means of nutrition appropriate at this time. Recommend NPO except for free water protocol under strict guidelines. Pending plan for intervention for tumor, would recommend continued SLP services for maintenance of musculature and potential for a po diet in the future.  Impact on safety and function Severe aspiration risk;Risk for inadequate nutrition/hydration   CHL IP TREATMENT RECOMMENDATION 09/08/2015 Treatment Recommendations Therapy as outlined in treatment plan below    Prognosis 10/01/2015 Prognosis for Safe Diet Advancement Guarded Barriers to Reach Goals Severity of deficits Barriers/Prognosis Comment -- CHL IP DIET RECOMMENDATION 10/01/2015 SLP Diet Recommendations NPO;Ice chips PRN after oral care;Free water protocol after oral care;Alternative means - long-term Liquid Administration via -- Medication Administration Via alternative means Compensations -- Postural Changes --   CHL IP OTHER RECOMMENDATIONS 10/01/2015 Recommended Consults -- Oral Care Recommendations Oral care QID Other Recommendations --   CHL IP FOLLOW UP RECOMMENDATIONS 10/01/2015 Follow up Recommendations Outpatient SLP   CHL IP FREQUENCY AND DURATION 10/01/2015 Speech Therapy Frequency (ACUTE ONLY) min 2x/week Treatment Duration 2 weeks      CHL IP ORAL PHASE 10/01/2015 Oral Phase WFL Oral - Pudding Teaspoon -- Oral - Pudding Cup -- Oral - Honey Teaspoon -- Oral - Honey Cup -- Oral - Nectar Teaspoon -- Oral - Nectar Cup -- Oral - Nectar Straw -- Oral - Thin Teaspoon -- Oral - Thin Cup -- Oral - Thin Straw -- Oral - Puree -- Oral - Mech Soft -- Oral - Regular -- Oral - Multi-Consistency -- Oral - Pill -- Oral Phase - Comment --  CHL IP PHARYNGEAL PHASE 10/01/2015 Pharyngeal Phase Impaired Pharyngeal- Pudding Teaspoon NT Pharyngeal -- Pharyngeal- Pudding Cup -- Pharyngeal -- Pharyngeal- Honey Teaspoon NT Pharyngeal -- Pharyngeal- Honey Cup NT Pharyngeal -- Pharyngeal- Nectar Teaspoon Reduced airway/laryngeal closure;Reduced epiglottic inversion;Penetration/Aspiration during swallow;Penetration/Apiration after swallow;Pharyngeal residue - valleculae;Pharyngeal residue - pyriform Pharyngeal Material enters airway, passes BELOW cords and not ejected out despite cough attempt by patient Pharyngeal- Nectar Cup -- Pharyngeal -- Pharyngeal- Nectar Straw -- Pharyngeal -- Pharyngeal- Thin Teaspoon Reduced airway/laryngeal closure;Reduced epiglottic inversion;Penetration/Aspiration during swallow;Penetration/Apiration after  swallow;Pharyngeal residue - valleculae;Pharyngeal residue - pyriform Pharyngeal Material enters airway, passes BELOW cords and not ejected out despite cough attempt by patient Pharyngeal- Thin Cup -- Pharyngeal -- Pharyngeal- Thin Straw NT Pharyngeal -- Pharyngeal- Puree Reduced airway/laryngeal closure;Reduced epiglottic inversion;Penetration/Apiration after swallow;Pharyngeal residue - valleculae;Pharyngeal residue - pyriform Pharyngeal -- Pharyngeal- Mechanical Soft -- Pharyngeal -- Pharyngeal- Regular -- Pharyngeal -- Pharyngeal- Multi-consistency -- Pharyngeal -- Pharyngeal- Pill --  Pharyngeal -- Pharyngeal Comment --  CHL IP CERVICAL ESOPHAGEAL PHASE 10/01/2015 Cervical Esophageal Phase (No Data) Pudding Teaspoon -- Pudding Cup -- Honey Teaspoon -- Honey Cup -- Nectar Teaspoon -- Nectar Cup -- Nectar Straw -- Thin Teaspoon -- Thin Cup -- Thin Straw -- Puree -- Mechanical Soft -- Regular -- Multi-consistency -- Pill -- Cervical Esophageal Comment -- No flowsheet data found. Gabriel Rainwater MA, CCC-SLP [phone] Gabriel Rainwater Meryl 10/01/2015, 11:44 AM               Microbiology: No results found for this or any previous visit (from the past 240 hour(s)).   Labs: Basic Metabolic Panel:  Recent Labs Lab 10/06/15 0515 10/07/15 0420 10/08/15 0525 10/09/15 1050 10/10/15 0500 10/10/15 0502 10/11/15 0330  NA 146* 140 143 144  --  145 145  K 3.4* 4.2 4.1 3.7  --  3.4* 3.7  CL 106 103 104 102  --  102 106  CO2 27 26 29 29   --  31 31  GLUCOSE 213* 168* 207* 244*  --  267* 174*  BUN <5* 5* 8 7  --  7 11  CREATININE 1.01* 1.03* 0.87 0.85  --  0.89 0.95  CALCIUM 8.2* 8.2* 8.0* 8.7*  --  8.7* 9.1  MG  --   --   --   --  1.7  --  2.0  PHOS 2.6 2.9 3.3  --  2.7  --  4.7*   Liver Function Tests:  Recent Labs Lab 10/05/15 0605 10/06/15 0515 10/07/15 0420 10/08/15 0525 10/11/15 0330  AST  --   --   --   --  14*  ALT  --   --   --   --  14  ALKPHOS  --   --   --   --  105  BILITOT  --   --   --    --  0.5  PROT  --   --   --   --  7.0  ALBUMIN 2.0* 1.9* 2.0* 1.8* 2.2*   No results for input(s): LIPASE, AMYLASE in the last 168 hours. No results for input(s): AMMONIA in the last 168 hours. CBC:  Recent Labs Lab 10/07/15 0430 10/08/15 0525 10/09/15 1050 10/10/15 0501 10/11/15 0330  WBC 17.5* 14.4* 16.7* 15.0* 17.4*  NEUTROABS  --   --   --   --  14.1*  HGB 8.6* 7.6* 9.4* 9.0* 9.3*  HCT 27.4* 25.2* 29.5* 29.3* 31.4*  MCV 88.4 87.2 85.8 87.2 88.7  PLT 214 204 199 202 210   Cardiac Enzymes: No results for input(s): CKTOTAL, CKMB, CKMBINDEX, TROPONINI in the last 168 hours. BNP: BNP (last 3 results) No results for input(s): BNP in the last 8760 hours.  ProBNP (last 3 results) No results for input(s): PROBNP in the last 8760 hours.  CBG:  Recent Labs Lab 10/10/15 1617 10/10/15 1949 10/11/15 0003 10/11/15 0342 10/11/15 0950  GLUCAP 140* 147* 142* 146* 143*       SignedDomenic Polite MD.  [facility] 10/11/2015, 2:16 PM

## 2015-10-12 ENCOUNTER — Encounter: Payer: Self-pay | Admitting: *Deleted

## 2015-10-12 ENCOUNTER — Encounter (HOSPITAL_COMMUNITY): Payer: Self-pay | Admitting: Emergency Medicine

## 2015-10-12 ENCOUNTER — Emergency Department (HOSPITAL_COMMUNITY)
Admission: EM | Admit: 2015-10-12 | Discharge: 2015-10-13 | Disposition: A | Discharge status: Home / Self Care | Payer: Medicaid Other | Attending: Emergency Medicine | Admitting: Emergency Medicine

## 2015-10-12 DIAGNOSIS — Z794 Long term (current) use of insulin: Secondary | ICD-10-CM | POA: Insufficient documentation

## 2015-10-12 DIAGNOSIS — I10 Essential (primary) hypertension: Secondary | ICD-10-CM | POA: Diagnosis not present

## 2015-10-12 DIAGNOSIS — Z760 Encounter for issue of repeat prescription: Secondary | ICD-10-CM | POA: Insufficient documentation

## 2015-10-12 DIAGNOSIS — Z862 Personal history of diseases of the blood and blood-forming organs and certain disorders involving the immune mechanism: Secondary | ICD-10-CM | POA: Diagnosis not present

## 2015-10-12 DIAGNOSIS — E119 Type 2 diabetes mellitus without complications: Secondary | ICD-10-CM | POA: Diagnosis not present

## 2015-10-12 DIAGNOSIS — Z79899 Other long term (current) drug therapy: Secondary | ICD-10-CM | POA: Diagnosis not present

## 2015-10-12 DIAGNOSIS — Z930 Tracheostomy status: Secondary | ICD-10-CM | POA: Diagnosis not present

## 2015-10-12 DIAGNOSIS — Z87891 Personal history of nicotine dependence: Secondary | ICD-10-CM | POA: Diagnosis not present

## 2015-10-12 MED ORDER — FLUCONAZOLE IN SODIUM CHLORIDE 400-0.9 MG/200ML-% IV SOLN
400.0000 mg | INTRAVENOUS | Status: DC
  Administered 2015-10-12: 400 mg via INTRAVENOUS
  Filled 2015-10-12 (×2): qty 200

## 2015-10-12 NOTE — ED Provider Notes (Signed)
CSN: [id number]     Arrival date & time 10/12/15  1755 History   First MD Initiated Contact with Patient 10/12/15 1805     Chief Complaint  Patient presents with  . Medication Refill     (Consider location/radiation/quality/duration/timing/severity/associated sxs/prior Treatment) HPI  Ashley Ramsey is a 60 year old female who presents via EMS for medication needs . There is a level five caveat as the patient is trached and cannot communicate verbally. She also will not write. History is gathered form the nursing notes, Daughter via telephone, case Freight forwarder, and Home health nurse. The patient ws disfcharged yesterday on parenteral medications. Patient is taking ampicillin and fluconazole via IV. According to the patient's daughter April Jones, the home health nurse was supposed to, round, and hanging medications. However, they did not give her the fluconazole. The question that the daughter several times about the medication. She was not able to provide a direct answer, but states "I just don't think that we can take care of her. This too much going on." I tried to discuss the fact that patients can frequently these medications at home without any problems. The patient states "we just not been able to do it. We cannot take care of her." spoke with Lucia Bitter, case manager, who states that she was also in communication with the home health nurse. Home health nurse said that her medications were delivered to the house today. However, when she confronted the daughter about whether they were placed. The daughter became extremely angry and said that she was being accused of flying and hung up the phone. Patient was sent to the ED by the daughter's apparently for placement into a skilled nursing facility as well as delivery of her fluconazole. The daughter does admit that the fluconazole is currently at her home. The patient has not had any worsening in her condition since discharge yesterday.  Past Medical  History  Diagnosis Date  . Thyroid disease   . Hypertension   . Neck mass hospitalized 08/29/2015  . Type II diabetes mellitus ([place])   . Left kidney mass   . Anemia, chronic disease    Past Surgical History  Procedure Laterality Date  . No past surgeries    . Esophagogastroduodenoscopy N/A 09/04/2015    Procedure: ESOPHAGOGASTRODUODENOSCOPY (EGD);  Surgeon: Manus Gunning, MD;  Location: [facility];  Service: Gastroenterology;  Laterality: N/A;  . Direct laryngoscopy N/A 09/02/2015    Procedure: DIRECT LARYNGOSCOPY WITH BIOPSY;  Surgeon: Izora Gala, MD;  Location: [facility];  Service: ENT;  Laterality: N/A;  . Esophagoscopy N/A 09/02/2015    Procedure: ESOPHAGOSCOPY;  Surgeon: Izora Gala, MD;  Location: [facility];  Service: ENT;  Laterality: N/A;  . Tracheostomy tube placement N/A 09/04/2015    Procedure: TRACHEOSTOMY;  Surgeon: Izora Gala, MD;  Location: [facility];  Service: ENT;  Laterality: N/A;  . Direct laryngoscopy  09/04/2015    Procedure: DIRECT View LARYNGOSCOPY with cautery of of tumor;  Surgeon: Izora Gala, MD;  Location: [facility] OR;  Service: ENT;;   Family History  Problem Relation Age of Onset  . Diabetes Mother   . Hypertension Mother   . Heart disease Mother   . Diabetes Sister   . Hypertension Sister   . Cancer Daughter     Stomach   Social History  Substance Use Topics  . Smoking status: Former Smoker -- 0.00 packs/day for 34 years    Types: Cigarettes    Quit date: 07/14/2015  . Smokeless tobacco:  Never Used  . Alcohol Use: No     Comment: 08/29/2015 "I drink 1/5th liquor one 1 day/week"   OB History    No data available     Review of Systems Ten systems reviewed and are negative for acute change, except as noted in the HPI.     Allergies  Review of patient's allergies indicates no known allergies.  Home Medications   Prior to Admission medications   Medication Sig Start Date End Date Taking? Authorizing Provider  acetaminophen (TYLENOL) 325 MG  suppository Place 1 suppository (325 mg total) rectally every 4 (four) hours as needed for mild pain. Patient taking differently: Place 325 mg rectally every 4 (four) hours as needed for mild pain (nausea).  09/12/15  Yes Thurnell Lose, MD  albuterol (PROVENTIL) (2.5 MG/3ML) 0.083% nebulizer solution Take 3 mLs (2.5 mg total) by nebulization every 6 (six) hours as needed for wheezing or shortness of breath. 09/12/15  Yes Thurnell Lose, MD  ampicillin 2 g in sodium chloride 0.9 % 50 mL Inject 2 g into the vein every 4 (four) hours. 10/11/15  Yes Domenic Polite, MD  fluconazole (DIFLUCAN) 400-0.9 MG/200ML-% IVPB Inject 200 mLs (400 mg total) into the vein daily. Until 3/15 10/11/15 10/26/15 Yes Domenic Polite, MD  HYDROcodone-acetaminophen (NORCO/VICODIN) 5-325 MG tablet Take 1 tablet by mouth every 6 (six) hours as needed for moderate pain. 10/11/15  Yes Domenic Polite, MD  insulin aspart (NOVOLOG) 100 UNIT/ML injection Before each meal 3 times a day, 140-199 - 2 units, 200-250 - 4 units, 251-299 - 6 units,  300-349 - 8 units,  350 or above 10 units. Dispense syringes and needles as needed, Ok to switch to PEN if approved. Substitute to any brand approved. DX DM2, Code E11.65 Patient taking differently: Inject 2-10 Units into the skin 3 (three) times daily with meals. Before each meal 3 times a day per sliding scale: CBG 140-199 - 2 units, 200-250 - 4 units, 251-299 - 6 units,  300-349 - 8 units,  350 or above 10 units. Dispense syringes and needles as needed, Ok to switch to PEN if approved. Substitute to any brand approved. DX DM2, Code E11.65 09/12/15  Yes Thurnell Lose, MD  NONFORMULARY OR COMPOUNDED ITEM TPN with electrolytes and Fat emulsion per [place] by protocol via PICC 10/11/15  Yes Domenic Polite, MD  pantoprazole (PROTONIX) 40 MG injection Inject 40 mg into the vein daily. 10/11/15  Yes Domenic Polite, MD  Blood Glucose Monitoring Suppl (TRUE METRIX METER) DEVI 1 each by Does not apply  route 3 (three) times daily before meals. 09/16/15   Arnoldo Morale, MD  glucose blood (FREESTYLE LITE) test strip For glucose testing every before meals at bedtime. Diagnosis E 11.65  Can substitute to any accepted brand 09/12/15   Thurnell Lose, MD  glucose blood (TRUE METRIX BLOOD GLUCOSE TEST) test strip Use 3 times daily before meals 09/16/15   Arnoldo Morale, MD  TRUEPLUS LANCETS 28G MISC 1 each by Does not apply route 3 (three) times daily before meals. 09/16/15   Arnoldo Morale, MD   BP 106/88 mmHg  Pulse 104  Temp(Src) 98.3 F (36.8 C) (Oral)  Resp 18  SpO2 97% Physical Exam  Constitutional: She is oriented to person, place, and time. She appears well-developed and well-nourished. No distress.  Appears much older than stated age  HENT:  Head: Normocephalic and atraumatic.  Eyes: Conjunctivae are normal. No scleral icterus.  Neck: Normal range of  motion.  Cardiovascular: Normal rate, regular rhythm and normal heart sounds.  Exam reveals no gallop and no friction rub.   No murmur heard. Pulmonary/Chest: Effort normal. No respiratory distress.  Tracheostomy in place.  Abdominal: Soft. Bowel sounds are normal. She exhibits no distension and no mass. There is no tenderness. There is no guarding.  Neurological: She is alert and oriented to person, place, and time.  Skin: Skin is warm and dry. She is not diaphoretic.  Nursing note and vitals reviewed.   ED Course  Procedures (including critical care time) Labs Review Labs Reviewed - No data to display  Imaging Review No results found. I have personally reviewed and evaluated these images and lab results as part of my medical decision-making.   EKG Interpretation None      MDM   Final diagnoses:  Medication therapy continued    Patient currently receiving IV antifungal. I have asked the case manager to intervene on the case.   11:34 PM Patient has received her IV fluconazole. She is sleeping, not currently. Mariann Laster has  spoken with the family has agreed to take the patient, but states that they do not want to take her back tonight. I have asked Mariann Laster to call the patient's family back as it is inappropriate for the patient to take up to an ED bed simply because her children don't want to take care of her tonight.   11:34 PM Patient's family will pick her up at 23:15 AM. Charge nurse notified   Margarita Mail, PA-C 10/16/15 [place], MD 11-15-15 0010

## 2015-10-12 NOTE — Progress Notes (Signed)
ED CM received call from [facility] at [facility] regarding patient being bought back to the hospital. Patient was discharged home yesterday with [facility] service with new Trach/TPA, and on continuous  IV ABX.  Tonight when [facility] nurse arrived to administer the abx  daughter Wynetta  stated they were unable to find the antibiotic, and when the nurse offered to assist with looking for the medication the daughter became upset and accused the nurse of calling her a liar. Family decided to have patient return to [facility] ED.  CM contacted daughter Jassidy. Discussed care goals, after lengthy discussion concerning care options with daughter Courtnie, Family agreed that they did not want  patient placed. They agreed to have patient return and resume  [facility] service, but stated that she cannot accept her home until tomorrow morning.  She states, she is still having concerns over the administering the IV ABX, explained that [facility] will instruct the family on how to administer the IV, family verbalized understanding and is agreeable with discharge plan.  Updated A. Francee Piccolo and Thomasene Ripple on disposition plan both agreeable. Hand off left for Day time ED SW/ CM.

## 2015-10-12 NOTE — ED Notes (Signed)
Per EMS: pt discharged yesterday and does not have diflucan; pt family sent pt back until can get that med

## 2015-10-12 NOTE — Progress Notes (Signed)
Patient ID: Ashley Ramsey, female   DOB: April 25, 1956, 60 y.o.   MRN: [id number]   Returned call to Chong Sicilian, RN with [facility].  Gave verbal orders to resume care including home TPN and IV antibiotics.

## 2015-10-13 ENCOUNTER — Inpatient Hospital Stay: Payer: Medicaid Other | Admitting: Family Medicine

## 2015-10-13 MED ORDER — PANTOPRAZOLE SODIUM 40 MG PO TBEC: 40.0000 mg | DELAYED_RELEASE_TABLET | Freq: Every day | ORAL | Status: AC

## 2015-10-13 MED ORDER — INSULIN ASPART 100 UNIT/ML ~~LOC~~ SOLN: 2.0000 [IU] | Freq: Three times a day (TID) | SUBCUTANEOUS | Status: DC

## 2015-10-13 MED ORDER — PANTOPRAZOLE SODIUM 40 MG IV SOLR: 40.0000 mg | Freq: Every day | INTRAVENOUS | Status: DC

## 2015-10-13 NOTE — ED Notes (Signed)
Attempted to fax meal tray request.

## 2015-10-13 NOTE — Progress Notes (Signed)
CSW called and spoke with Patient's daughter, April Brittinee Risk ([phone]) who reports that her sister, Vicente Males, will be here to pick patient up around 10-11AM. CSW could not locate Anna's number to confirm. CSW will continue to follow for disposition.   Holly Bodily, LCSW [facility] ED Clinical Social Worker [phone]

## 2015-10-13 NOTE — ED Notes (Signed)
Pt wanting to go home.  She is ambulatory A& Ox4.  Pt states she has her key and meds at home.  Will give pt taxi voucher.

## 2015-10-13 NOTE — Progress Notes (Signed)
Patient's daughter still has not arrived to pick Patient up. CSW has spoken with RN and PA who feel that Patient is competent and physically able to take a taxi. CSW received authorization from CSW Surveyor, quantity to provide voucher. Taxi voucher provided to Patient for transport home. Taxi en route. CSW signing off at this time. Please contact if new need(s) arise.   Holly Bodily, LCSW [facility] Clinical Social Worker [phone]

## 2015-10-14 ENCOUNTER — Telehealth: Payer: Self-pay

## 2015-10-14 NOTE — Telephone Encounter (Signed)
Patient's appointment rescheduled for 10/27/15 at 0900 with Dr. Jarold Song as patient missed her appointment on 10/13/15 because she was in the ED. Patient's daughter, Darlina, aware and agreeable. No additional needs identified.

## 2015-10-15 ENCOUNTER — Emergency Department (HOSPITAL_COMMUNITY): Payer: Medicaid Other

## 2015-10-15 ENCOUNTER — Encounter (HOSPITAL_COMMUNITY)
Admission: EM | Disposition: A | Discharge status: Home Health | Payer: Self-pay | Source: Home / Self Care | Attending: Internal Medicine

## 2015-10-15 ENCOUNTER — Inpatient Hospital Stay (HOSPITAL_COMMUNITY)
Admission: EM | Admit: 2015-10-15 | Discharge: 2015-10-18 | DRG: 982 | Disposition: A | Discharge status: Home Health | Payer: Medicaid Other | Attending: Internal Medicine | Admitting: Internal Medicine

## 2015-10-15 ENCOUNTER — Emergency Department (HOSPITAL_COMMUNITY): Payer: Medicaid Other | Admitting: Anesthesiology

## 2015-10-15 ENCOUNTER — Encounter (HOSPITAL_COMMUNITY): Payer: Self-pay

## 2015-10-15 DIAGNOSIS — J449 Chronic obstructive pulmonary disease, unspecified: Secondary | ICD-10-CM | POA: Diagnosis present

## 2015-10-15 DIAGNOSIS — Z8249 Family history of ischemic heart disease and other diseases of the circulatory system: Secondary | ICD-10-CM

## 2015-10-15 DIAGNOSIS — Z6835 Body mass index (BMI) 35.0-35.9, adult: Secondary | ICD-10-CM

## 2015-10-15 DIAGNOSIS — R042 Hemoptysis: Secondary | ICD-10-CM | POA: Diagnosis present

## 2015-10-15 DIAGNOSIS — Z809 Family history of malignant neoplasm, unspecified: Secondary | ICD-10-CM | POA: Diagnosis not present

## 2015-10-15 DIAGNOSIS — Z9911 Dependence on respirator [ventilator] status: Secondary | ICD-10-CM | POA: Diagnosis not present

## 2015-10-15 DIAGNOSIS — I10 Essential (primary) hypertension: Secondary | ICD-10-CM | POA: Diagnosis present

## 2015-10-15 DIAGNOSIS — Z87891 Personal history of nicotine dependence: Secondary | ICD-10-CM | POA: Diagnosis not present

## 2015-10-15 DIAGNOSIS — E876 Hypokalemia: Secondary | ICD-10-CM | POA: Diagnosis present

## 2015-10-15 DIAGNOSIS — J9501 Hemorrhage from tracheostomy stoma: Principal | ICD-10-CM | POA: Diagnosis present

## 2015-10-15 DIAGNOSIS — C329 Malignant neoplasm of larynx, unspecified: Secondary | ICD-10-CM

## 2015-10-15 DIAGNOSIS — N179 Acute kidney failure, unspecified: Secondary | ICD-10-CM | POA: Diagnosis present

## 2015-10-15 DIAGNOSIS — N2889 Other specified disorders of kidney and ureter: Secondary | ICD-10-CM | POA: Diagnosis present

## 2015-10-15 DIAGNOSIS — E119 Type 2 diabetes mellitus without complications: Secondary | ICD-10-CM | POA: Diagnosis present

## 2015-10-15 DIAGNOSIS — Z23 Encounter for immunization: Secondary | ICD-10-CM

## 2015-10-15 DIAGNOSIS — D638 Anemia in other chronic diseases classified elsewhere: Secondary | ICD-10-CM | POA: Diagnosis present

## 2015-10-15 DIAGNOSIS — E870 Hyperosmolality and hypernatremia: Secondary | ICD-10-CM | POA: Diagnosis present

## 2015-10-15 DIAGNOSIS — Z794 Long term (current) use of insulin: Secondary | ICD-10-CM

## 2015-10-15 HISTORY — PX: RADIOLOGY WITH ANESTHESIA: SHX6223

## 2015-10-15 LAB — CBC
HEMATOCRIT: 35.1 % — AB (ref 36.0–46.0)
Hemoglobin: 10.6 g/dL — ABNORMAL LOW (ref 12.0–15.0)
MCH: 26.9 pg (ref 26.0–34.0)
MCHC: 30.2 g/dL (ref 30.0–36.0)
MCV: 89.1 fL (ref 78.0–100.0)
PLATELETS: 256 10*3/uL (ref 150–400)
RBC: 3.94 MIL/uL (ref 3.87–5.11)
RDW: 15.9 % — AB (ref 11.5–15.5)
WBC: 19.9 10*3/uL — ABNORMAL HIGH (ref 4.0–10.5)

## 2015-10-15 LAB — BASIC METABOLIC PANEL
ANION GAP: 13 (ref 5–15)
BUN: 25 mg/dL — AB (ref 6–20)
CALCIUM: 9.5 mg/dL (ref 8.9–10.3)
CO2: 26 mmol/L (ref 22–32)
CREATININE: 1.01 mg/dL — AB (ref 0.44–1.00)
Chloride: 111 mmol/L (ref 101–111)
GFR calc Af Amer: >60 mL/min (ref >60)
GFR, EST NON AFRICAN AMERICAN: 60 mL/min — AB (ref >60)
GLUCOSE: 241 mg/dL — AB (ref 65–99)
Potassium: 3.4 mmol/L — ABNORMAL LOW (ref 3.5–5.1)
Sodium: 150 mmol/L — ABNORMAL HIGH (ref 135–145)

## 2015-10-15 SURGERY — RADIOLOGY WITH ANESTHESIA
Anesthesia: General

## 2015-10-15 MED ORDER — LIDOCAINE HCL 1 % IJ SOLN
INTRAMUSCULAR | Status: AC
  Filled 2015-10-15: qty 20

## 2015-10-15 MED ORDER — SODIUM CHLORIDE 0.9 % IV SOLN
2.0000 g | INTRAVENOUS | Status: DC
  Administered 2015-10-16 – 2015-10-18 (×15): 2 g via INTRAVENOUS
  Filled 2015-10-15 (×19): qty 2000

## 2015-10-15 MED ORDER — ROCURONIUM BROMIDE 100 MG/10ML IV SOLN
INTRAVENOUS | Status: DC | PRN
  Administered 2015-10-15: 50 mg via INTRAVENOUS
  Administered 2015-10-15: 15 mg via INTRAVENOUS
  Administered 2015-10-15: 10 mg via INTRAVENOUS
  Administered 2015-10-15: 25 mg via INTRAVENOUS

## 2015-10-15 MED ORDER — CEFAZOLIN SODIUM-DEXTROSE 2-3 GM-% IV SOLR
INTRAVENOUS | Status: AC
  Filled 2015-10-15: qty 50

## 2015-10-15 MED ORDER — NITROGLYCERIN 1 MG/10 ML FOR IR/CATH LAB
INTRA_ARTERIAL | Status: AC
  Filled 2015-10-15: qty 10

## 2015-10-15 MED ORDER — HEPARIN SODIUM (PORCINE) 1000 UNIT/ML IJ SOLN
INTRAMUSCULAR | Status: DC | PRN
  Administered 2015-10-15: 1000 [IU] via INTRAVENOUS
  Administered 2015-10-15: 2000 [IU] via INTRAVENOUS

## 2015-10-15 MED ORDER — FENTANYL CITRATE (PF) 250 MCG/5ML IJ SOLN
INTRAMUSCULAR | Status: DC | PRN
  Administered 2015-10-15 (×3): 50 ug via INTRAVENOUS
  Administered 2015-10-16: 100 ug via INTRAVENOUS

## 2015-10-15 MED ORDER — FLUCONAZOLE IN SODIUM CHLORIDE 400-0.9 MG/200ML-% IV SOLN
400.0000 mg | Freq: Every day | INTRAVENOUS | Status: DC
  Administered 2015-10-16 – 2015-10-17 (×3): 400 mg via INTRAVENOUS
  Filled 2015-10-15 (×5): qty 200

## 2015-10-15 MED ORDER — PROPOFOL 500 MG/50ML IV EMUL
INTRAVENOUS | Status: DC | PRN
  Administered 2015-10-15: 25 ug/kg/min via INTRAVENOUS

## 2015-10-15 MED ORDER — CEFAZOLIN SODIUM-DEXTROSE 2-3 GM-% IV SOLR
INTRAVENOUS | Status: DC | PRN
  Administered 2015-10-15: 2 g via INTRAVENOUS

## 2015-10-15 MED ORDER — IOHEXOL 300 MG/ML  SOLN
300.0000 mL | Freq: Once | INTRAMUSCULAR | Status: AC | PRN
  Administered 2015-10-15: 150 mL via INTRA_ARTERIAL

## 2015-10-15 MED ORDER — PHENYLEPHRINE HCL 10 MG/ML IJ SOLN
10.0000 mg | INTRAVENOUS | Status: DC | PRN
  Administered 2015-10-15: 25 ug/min via INTRAVENOUS

## 2015-10-15 MED ORDER — PROTAMINE SULFATE 10 MG/ML IV SOLN
INTRAVENOUS | Status: DC | PRN
  Administered 2015-10-15: 5 mg via INTRAVENOUS

## 2015-10-15 MED ORDER — SODIUM CHLORIDE 0.9 % IV SOLN: 250.0000 mL | INTRAVENOUS | Status: DC | PRN

## 2015-10-15 NOTE — Procedures (Signed)
S/P bilateral CCA and ECA arteriograms followed by superselective embolizatiomn of bilateral lingual and superior thyroidal arteries using  250 to 350 micron PVA particles and 350 to 500 micron PVA particles

## 2015-10-15 NOTE — ED Notes (Signed)
Respiratory called for request to assist with tracheotomy care

## 2015-10-15 NOTE — ED Notes (Signed)
PER EMS: pt is from home. She had a Trach placed in January due to two possibly obstructing tumors in her throat. Home health comes to suction her trach twice daily. She reports her trach has not been changed out since it was placed in January. Last night the family noticed blood in her sputum and blood is seen around the trach site itself. Bleeding is controlled and airway intact. BP - 190/100, HR-110, RR-20, CBG-236. A&OX4.

## 2015-10-15 NOTE — ED Notes (Signed)
Respiratory called as requested by Dr. Ancil Linsey [phone]).  Requesting that patients trach be replaced with a cuffed one.  MD requesting to be at bedside for efforts.  Respiratory made aware.

## 2015-10-15 NOTE — Consult Note (Addendum)
Patient known to me with advanced stage hypopharyngeal cancer. She had a semi-emergent tracheostomy couple of months ago. She has been hospitalized a couple of times since then with infectious problems. She is scheduled to see Dr. Vicie Mutters at [facility] for consideration of laryngopharyngectomy with free flap reconstruction sometime in the next week or 2.   She came in today to the emergency department because of hemoptysis. It started from her nose and then mouth and now is coming out of her tracheostomy as well. She is comfortable and otherwise in no distress.   On exam, she is awake and alert and able to communicate. Oral cavity and pharynx are significant for blood staining. Nasal exam clear. Flexible fiberoptic laryngoscopy performed and the entire hypopharynx is replaced with a large tumor mass. Minimal normal landmarks are visible. There is some slow oozing from the tumor. The scope was also passed down the tracheostomy and distal to the tracheostomy appears to be clear with some blood draining down from above. There is no tracheal ulceration. There is no palpable neck masses.  Advanced hypopharyngeal/laryngeal cancer with bleeding. Recommend interventional radiology consultation to see if this can be embolized. Ultimately, she will need surgical treatment of this cancer if considered appropriate and if she is willing to.   Tracheostomy was changed from a #6 uncuffed to a #6 cuffed in order for radiology to proceed with procedure. There were no complications.

## 2015-10-15 NOTE — ED Provider Notes (Signed)
CSN: [id number]     Arrival date & time 10/15/15  1659 History   First MD Initiated Contact with Patient 10/15/15 1700     Chief Complaint  Patient presents with  . Bleeding from trach     Ashley Ramsey is a 60 y.o. female with a history of laryngeal cancer with the trachea presents to the emergency department complaining of bleeding from around her trach since last night. Her trach was placed in Jan 2017 by Dr. Constance Holster. She reports the bleeding has worsened since last night. She denies any trauma or injury to her trach. She denies falling. She is not on any anticoagulants. She denies chest pain or trouble breathing. She denies feeling short of breath. She denies fevers, vomiting or diarrhea. She is currently receiving IV fluconazole and ampicillin via PICC line.   The history is provided by the patient and medical records. No language interpreter was used.    Past Medical History  Diagnosis Date  . Thyroid disease   . Hypertension   . Neck mass hospitalized 08/29/2015  . Type II diabetes mellitus ([place])   . Left kidney mass   . Anemia, chronic disease    Past Surgical History  Procedure Laterality Date  . No past surgeries    . Esophagogastroduodenoscopy N/A 09/04/2015    Procedure: ESOPHAGOGASTRODUODENOSCOPY (EGD);  Surgeon: Manus Gunning, MD;  Location: [place];  Service: Gastroenterology;  Laterality: N/A;  . Direct laryngoscopy N/A 09/02/2015    Procedure: DIRECT LARYNGOSCOPY WITH BIOPSY;  Surgeon: Izora Gala, MD;  Location: [place];  Service: ENT;  Laterality: N/A;  . Esophagoscopy N/A 09/02/2015    Procedure: ESOPHAGOSCOPY;  Surgeon: Izora Gala, MD;  Location: [place];  Service: ENT;  Laterality: N/A;  . Tracheostomy tube placement N/A 09/04/2015    Procedure: TRACHEOSTOMY;  Surgeon: Izora Gala, MD;  Location: [place];  Service: ENT;  Laterality: N/A;  . Direct laryngoscopy  09/04/2015    Procedure: DIRECT View LARYNGOSCOPY with cautery of of tumor;  Surgeon: Izora Gala, MD;   Location: [facility] OR;  Service: ENT;;   Family History  Problem Relation Age of Onset  . Diabetes Mother   . Hypertension Mother   . Heart disease Mother   . Diabetes Sister   . Hypertension Sister   . Cancer Daughter     Stomach   Social History  Substance Use Topics  . Smoking status: Former Smoker -- 0.00 packs/day for 34 years    Types: Cigarettes    Quit date: 07/14/2015  . Smokeless tobacco: Never Used  . Alcohol Use: No     Comment: 08/29/2015 "I drink 1/5th liquor one 1 day/week"   OB History    No data available     Review of Systems  Constitutional: Negative for fever.  HENT: Negative for congestion and sore throat.        Bleeding from around trach.   Eyes: Negative for visual disturbance.  Respiratory: Negative for cough and shortness of breath.   Cardiovascular: Negative for chest pain.  Gastrointestinal: Negative for nausea, vomiting, abdominal pain and diarrhea.  Genitourinary: Negative for dysuria.  Musculoskeletal: Negative for back pain and neck pain.  Skin: Negative for rash.  Neurological: Negative for syncope, light-headedness and headaches.      Allergies  Review of patient's allergies indicates no known allergies.  Home Medications   Prior to Admission medications   Medication Sig Start Date End Date Taking? Authorizing Provider  acetaminophen (TYLENOL) 325 MG suppository  Place 1 suppository (325 mg total) rectally every 4 (four) hours as needed for mild pain. Patient taking differently: Place 325 mg rectally every 4 (four) hours as needed for mild pain (nausea).  09/12/15  Yes Thurnell Lose, MD  albuterol (PROVENTIL) (2.5 MG/3ML) 0.083% nebulizer solution Take 3 mLs (2.5 mg total) by nebulization every 6 (six) hours as needed for wheezing or shortness of breath. 09/12/15  Yes Thurnell Lose, MD  ampicillin 2 g in sodium chloride 0.9 % 50 mL Inject 2 g into the vein every 4 (four) hours. 10/11/15  Yes Domenic Polite, MD  fluconazole (DIFLUCAN)  400-0.9 MG/200ML-% IVPB Inject 200 mLs (400 mg total) into the vein daily. Until 3/15 10/11/15 10/26/15 Yes Domenic Polite, MD  HYDROcodone-acetaminophen (NORCO/VICODIN) 5-325 MG tablet Take 1 tablet by mouth every 6 (six) hours as needed for moderate pain. 10/11/15  Yes Domenic Polite, MD  insulin aspart (NOVOLOG) 100 UNIT/ML injection Before each meal 3 times a day, 140-199 - 2 units, 200-250 - 4 units, 251-299 - 6 units,  300-349 - 8 units,  350 or above 10 units. Dispense syringes and needles as needed, Ok to switch to PEN if approved. Substitute to any brand approved. DX DM2, Code E11.65 Patient taking differently: Inject 2-10 Units into the skin 3 (three) times daily with meals. Before each meal 3 times a day per sliding scale: CBG 140-199 - 2 units, 200-250 - 4 units, 251-299 - 6 units,  300-349 - 8 units,  350 or above 10 units. Dispense syringes and needles as needed, Ok to switch to PEN if approved. Substitute to any brand approved. DX DM2, Code E11.65 09/12/15  Yes Thurnell Lose, MD  NONFORMULARY OR COMPOUNDED ITEM TPN with electrolytes and Fat emulsion per [place] by protocol via PICC 10/11/15  Yes Domenic Polite, MD  pantoprazole (PROTONIX) 40 MG tablet Take 1 tablet (40 mg total) by mouth daily. 10/13/15  Yes Blanchie Dessert, MD  Blood Glucose Monitoring Suppl (TRUE METRIX METER) DEVI 1 each by Does not apply route 3 (three) times daily before meals. 09/16/15   Arnoldo Morale, MD  glucose blood (FREESTYLE LITE) test strip For glucose testing every before meals at bedtime. Diagnosis E 11.65  Can substitute to any accepted brand 09/12/15   Thurnell Lose, MD  glucose blood (TRUE METRIX BLOOD GLUCOSE TEST) test strip Use 3 times daily before meals 09/16/15   Arnoldo Morale, MD  TRUEPLUS LANCETS 28G MISC 1 each by Does not apply route 3 (three) times daily before meals. 09/16/15   Arnoldo Morale, MD   BP 156/101 mmHg  Pulse 110  Temp(Src) 99.2 F (37.3 C) (Oral)  Resp 14  SpO2 100% Physical Exam   Constitutional: She is oriented to person, place, and time. She appears well-developed and well-nourished. No distress.  HENT:  Head: Normocephalic and atraumatic.  Mouth/Throat: Oropharynx is clear and moist.  Blood around trach which is in place. Blood in mouth and patient spitting small amount of bright red blood. No evidence of trauma.  Mucous membranes appear slightly dry.  Eyes: Conjunctivae are normal. Pupils are equal, round, and reactive to light. Right eye exhibits no discharge. Left eye exhibits no discharge.  Neck: Neck supple.  See HENT  Cardiovascular: Regular rhythm, normal heart sounds and intact distal pulses.  Exam reveals no gallop and no friction rub.   No murmur heard. Heart rate 112.  Pulmonary/Chest: Effort normal and breath sounds normal. No respiratory distress. She has no wheezes.  She has no rales.  No respiratory distress. No tachypnea. Scattered wheezes noted diffusely on lung exam.  Abdominal: Soft. There is no tenderness.  Musculoskeletal: She exhibits no edema.  Lymphadenopathy:    She has no cervical adenopathy.  Neurological: She is alert and oriented to person, place, and time. Coordination normal.  Skin: Skin is warm and dry. No rash noted. She is not diaphoretic. No erythema. No pallor.  Psychiatric: She has a normal mood and affect. Her behavior is normal.  Nursing note and vitals reviewed.   ED Course  Procedures (including critical care time) Labs Review Labs Reviewed  CBC - Abnormal; Notable for the following:    WBC 19.9 (*)    Hemoglobin 10.6 (*)    HCT 35.1 (*)    RDW 15.9 (*)    All other components within normal limits  BASIC METABOLIC PANEL - Abnormal; Notable for the following:    Sodium 150 (*)    Potassium 3.4 (*)    Glucose, Bld 241 (*)    BUN 25 (*)    Creatinine, Ser 1.01 (*)    GFR calc non Af Amer 60 (*)    All other components within normal limits  MRSA PCR SCREENING  PROTIME-INR  CBC  CBC  CBC  BASIC METABOLIC  PANEL  MAGNESIUM  PHOSPHORUS  CBC  CBC  CBC    Imaging Review No results found. I have personally reviewed and evaluated these images and lab results as part of my medical decision-making.   EKG Interpretation None      Filed Vitals:   10/15/15 1708 10/15/15 1721 10/15/15 1935 10/16/15 0022  BP: 158/99  156/101   Pulse: 118 117 98 110  Temp: 99.2 F (37.3 C)     TempSrc: Oral     Resp: 22 20 18 14   SpO2: 99% 96% 100% 100%     MDM   Meds given in ED:  Medications  lidocaine (XYLOCAINE) 1 % (with pres) injection (not administered)  ceFAZolin (ANCEF) 2-3 GM-% IVPB SOLR (not administered)  nitroGLYCERIN 100 MCG/ML intra-arterial injection (not administered)  ampicillin (OMNIPEN) 2 g in sodium chloride 0.9 % 50 mL IVPB (not administered)  fluconazole (DIFLUCAN) IVPB 400 mg (not administered)  0.9 %  sodium chloride infusion (not administered)  iohexol (OMNIPAQUE) 300 MG/ML solution 300 mL (150 mLs Intra-arterial Contrast Given 10/15/15 2337)    Current Discharge Medication List      Final diagnoses:  Laryngeal cancer [facility])   This s a 60 y.o. female with a history of laryngeal cancer with the trachea presents to the emergency department complaining of bleeding from around her trach since last night. Her trach was placed in Jan 2017 by Dr. Constance Holster. She reports the bleeding has worsened since last night. She denies any trauma or injury to her trach.  On exam the patient is afebrile nontoxic appearing. Initially on exam patient has a small bladder blood oozing around her trach. Later, patient is spitting up blood from her mouth and having some increased blood from her trach. She has no difficulty breathing. Her airway is intact.  I consulted with ENT physician Dr. Constance Holster who will be by to see the patient. Hemoglobin is 10.6 which is an improvement from her last blood work. She is hypernatremic with a sodium of 150. Dr. Constance Holster reports that the patient is bleeding from her laryngeal  tumor and she will need IR to help slow the bleeding. Patient is in agreement with plan for IR  to take her to the OR.  Dr. Regenia Skeeter consulted with neuro IR who will take the patient to the OR. Dr. Regenia Skeeter also consulted with CCM to make them aware of her plan for IR procedure and that she will likely go to ICU after procedure due to being intubated.   Patient to IR suite.    Waynetta Pean, PA-C 10/16/15 [id number]  Sherwood Gambler, MD 10/19/15 [phone]

## 2015-10-15 NOTE — ED Notes (Signed)
PA at bedside updating patient.

## 2015-10-15 NOTE — ED Notes (Signed)
Pt to IR.  Report given to IR staff who came to pick patient up.

## 2015-10-15 NOTE — Anesthesia Procedure Notes (Signed)
Date/Time: 10/15/2015 9:56 PM Performed by: Valetta Fuller Pre-anesthesia Checklist: Patient identified, Emergency Drugs available, Suction available and Patient being monitored Oxygen Delivery Method: Circle system utilized Intubation Type: Inhalational induction Comments: Inhalation induction by existing tracheostomy tube.

## 2015-10-15 NOTE — Anesthesia Preprocedure Evaluation (Addendum)
Anesthesia Evaluation  Patient identified by MRN, date of birth, ID band Patient awake    Reviewed: Allergy & Precautions, NPO status , Patient's Chart, lab work & pertinent test results, Unable to perform ROS - Chart review onlyPreop documentation limited or incomplete due to emergent nature of procedure.  Airway Mallampati: Trach       Dental   Pulmonary former smoker,  Hypopharyngeal mass    breath sounds clear to auscultation       Cardiovascular hypertension,  Rhythm:Regular Rate:Normal     Neuro/Psych negative neurological ROS     GI/Hepatic Neg liver ROS, PUD, GERD  ,  Endo/Other  diabetes, Insulin Dependent  Renal/GU Renal disease (renal mass)     Musculoskeletal   Abdominal   Peds  Hematology  (+) anemia ,   Anesthesia Other Findings   Reproductive/Obstetrics                            Lab Results  Component Value Date   WBC 19.9* 10/15/2015   HGB 10.6* 10/15/2015   HCT 35.1* 10/15/2015   MCV 89.1 10/15/2015   PLT 256 10/15/2015   Lab Results  Component Value Date   CREATININE 1.01* 10/15/2015   BUN 25* 10/15/2015   NA 150* 10/15/2015   K 3.4* 10/15/2015   CL 111 10/15/2015   CO2 26 10/15/2015    Anesthesia Physical Anesthesia Plan  ASA: IV and emergent  Anesthesia Plan: General   Post-op Pain Management:    Induction: Intravenous  Airway Management Planned: Tracheostomy  Additional Equipment:   Intra-op Plan:   Post-operative Plan: Possible Post-op intubation/ventilation  Informed Consent: I have reviewed the patients History and Physical, chart, labs and discussed the procedure including the risks, benefits and alternatives for the proposed anesthesia with the patient or authorized representative who has indicated his/her understanding and acceptance.     Plan Discussed with: CRNA  Anesthesia Plan Comments:         Anesthesia Quick  Evaluation

## 2015-10-15 NOTE — ED Notes (Signed)
Will, PA at bedside. 

## 2015-10-16 ENCOUNTER — Inpatient Hospital Stay (HOSPITAL_COMMUNITY): Payer: Medicaid Other

## 2015-10-16 DIAGNOSIS — R042 Hemoptysis: Secondary | ICD-10-CM

## 2015-10-16 DIAGNOSIS — Z9911 Dependence on respirator [ventilator] status: Secondary | ICD-10-CM

## 2015-10-16 LAB — GLUCOSE, CAPILLARY
GLUCOSE-CAPILLARY: 378 mg/dL — AB (ref 65–99)
GLUCOSE-CAPILLARY: 256 mg/dL — AB (ref 65–99)
GLUCOSE-CAPILLARY: 131 mg/dL — AB (ref 65–99)
GLUCOSE-CAPILLARY: 209 mg/dL — AB (ref 65–99)
Glucose-Capillary: 108 mg/dL — ABNORMAL HIGH (ref 65–99)
Glucose-Capillary: 139 mg/dL — ABNORMAL HIGH (ref 65–99)
Glucose-Capillary: 126 mg/dL — ABNORMAL HIGH (ref 65–99)

## 2015-10-16 LAB — PHOSPHORUS: Phosphorus: 3.3 mg/dL (ref 2.5–4.6)

## 2015-10-16 LAB — BASIC METABOLIC PANEL
ANION GAP: 13 (ref 5–15)
Anion gap: 12 (ref 5–15)
BUN: 28 mg/dL — AB (ref 6–20)
BUN: 15 mg/dL (ref 6–20)
CALCIUM: 8.6 mg/dL — AB (ref 8.9–10.3)
CO2: 23 mmol/L (ref 22–32)
CO2: 21 mmol/L — ABNORMAL LOW (ref 22–32)
CREATININE: 1.04 mg/dL — AB (ref 0.44–1.00)
Calcium: 8.6 mg/dL — ABNORMAL LOW (ref 8.9–10.3)
Chloride: 111 mmol/L (ref 101–111)
Chloride: 112 mmol/L — ABNORMAL HIGH (ref 101–111)
Creatinine, Ser: 0.95 mg/dL (ref 0.44–1.00)
GFR calc Af Amer: >60 mL/min (ref >60)
GFR calc Af Amer: >60 mL/min (ref >60)
GFR, EST NON AFRICAN AMERICAN: 58 mL/min — AB (ref >60)
GLUCOSE: 219 mg/dL — AB (ref 65–99)
Glucose, Bld: 299 mg/dL — ABNORMAL HIGH (ref 65–99)
POTASSIUM: 3.4 mmol/L — AB (ref 3.5–5.1)
Potassium: 2.8 mmol/L — ABNORMAL LOW (ref 3.5–5.1)
SODIUM: 146 mmol/L — AB (ref 135–145)
SODIUM: 146 mmol/L — AB (ref 135–145)

## 2015-10-16 LAB — CBC
HCT: 28 % — ABNORMAL LOW (ref 36.0–46.0)
HCT: 26.2 % — ABNORMAL LOW (ref 36.0–46.0)
HCT: 25.9 % — ABNORMAL LOW (ref 36.0–46.0)
HCT: 26.2 % — ABNORMAL LOW (ref 36.0–46.0)
HEMOGLOBIN: 8.2 g/dL — AB (ref 12.0–15.0)
Hemoglobin: 8.3 g/dL — ABNORMAL LOW (ref 12.0–15.0)
Hemoglobin: 7.9 g/dL — ABNORMAL LOW (ref 12.0–15.0)
Hemoglobin: 7.9 g/dL — ABNORMAL LOW (ref 12.0–15.0)
MCH: 26.6 pg (ref 26.0–34.0)
MCH: 26.5 pg (ref 26.0–34.0)
MCH: 26.3 pg (ref 26.0–34.0)
MCH: 27.6 pg (ref 26.0–34.0)
MCHC: 29.6 g/dL — AB (ref 30.0–36.0)
MCHC: 30.2 g/dL (ref 30.0–36.0)
MCHC: 30.2 g/dL (ref 30.0–36.0)
MCHC: 31.7 g/dL (ref 30.0–36.0)
MCV: 87.2 fL (ref 78.0–100.0)
MCV: 87.3 fL (ref 78.0–100.0)
MCV: 87.9 fL (ref 78.0–100.0)
MCV: 89.7 fL (ref 78.0–100.0)
PLATELETS: 183 10*3/uL (ref 150–400)
PLATELETS: 188 10*3/uL (ref 150–400)
PLATELETS: 198 10*3/uL (ref 150–400)
PLATELETS: 200 10*3/uL (ref 150–400)
RBC: 3.12 MIL/uL — ABNORMAL LOW (ref 3.87–5.11)
RBC: 2.97 MIL/uL — ABNORMAL LOW (ref 3.87–5.11)
RBC: 3 MIL/uL — ABNORMAL LOW (ref 3.87–5.11)
RBC: 2.98 MIL/uL — AB (ref 3.87–5.11)
RDW: 15.8 % — AB (ref 11.5–15.5)
RDW: 16 % — ABNORMAL HIGH (ref 11.5–15.5)
RDW: 16.1 % — AB (ref 11.5–15.5)
RDW: 16.1 % — ABNORMAL HIGH (ref 11.5–15.5)
WBC: 16 10*3/uL — AB (ref 4.0–10.5)
WBC: 18.1 10*3/uL — AB (ref 4.0–10.5)
WBC: 19.7 10*3/uL — AB (ref 4.0–10.5)
WBC: 24.9 10*3/uL — ABNORMAL HIGH (ref 4.0–10.5)

## 2015-10-16 LAB — CBC WITH DIFFERENTIAL/PLATELET
BASOS ABS: 0 10*3/uL (ref 0.0–0.1)
Basophils Relative: 0 %
Eosinophils Absolute: 0.3 10*3/uL (ref 0.0–0.7)
Eosinophils Relative: 2 %
HEMATOCRIT: 26.1 % — AB (ref 36.0–46.0)
HEMOGLOBIN: 7.8 g/dL — AB (ref 12.0–15.0)
LYMPHS PCT: 17 %
Lymphs Abs: 2.7 10*3/uL (ref 0.7–4.0)
MCH: 26.4 pg (ref 26.0–34.0)
MCHC: 29.9 g/dL — AB (ref 30.0–36.0)
MCV: 88.5 fL (ref 78.0–100.0)
Monocytes Absolute: 0.7 10*3/uL (ref 0.1–1.0)
Monocytes Relative: 4 %
NEUTROS ABS: 12 10*3/uL — AB (ref 1.7–7.7)
Neutrophils Relative %: 77 %
Platelets: 177 10*3/uL (ref 150–400)
RBC: 2.95 MIL/uL — AB (ref 3.87–5.11)
RDW: 16.1 % — ABNORMAL HIGH (ref 11.5–15.5)
WBC: 15.6 10*3/uL — AB (ref 4.0–10.5)

## 2015-10-16 LAB — POCT I-STAT 3, ART BLOOD GAS (G3+)
Acid-base deficit: 1 mmol/L (ref 0.0–2.0)
BICARBONATE: 26.5 meq/L — AB (ref 20.0–24.0)
Bicarbonate: 24.6 mEq/L — ABNORMAL HIGH (ref 20.0–24.0)
O2 Saturation: 100 %
O2 Saturation: 99 %
PCO2 ART: 42.1 mmHg (ref 35.0–45.0)
PCO2 ART: 54.2 mmHg — AB (ref 35.0–45.0)
PH ART: 7.294 — AB (ref 7.350–7.450)
PH ART: 7.373 (ref 7.350–7.450)
PO2 ART: 416 mmHg — AB (ref 80.0–100.0)
TCO2: 26 mmol/L (ref 0–100)
TCO2: 28 mmol/L (ref 0–100)
pO2, Arterial: 162 mmHg — ABNORMAL HIGH (ref 80.0–100.0)

## 2015-10-16 LAB — PROTIME-INR
INR: 1.26 (ref 0.00–1.49)
Prothrombin Time: 15.9 seconds — ABNORMAL HIGH (ref 11.6–15.2)

## 2015-10-16 LAB — MAGNESIUM: MAGNESIUM: 1.9 mg/dL (ref 1.7–2.4)

## 2015-10-16 LAB — TRIGLYCERIDES: Triglycerides: 223 mg/dL — ABNORMAL HIGH (ref <150)

## 2015-10-16 LAB — MRSA PCR SCREENING: MRSA by PCR: NEGATIVE

## 2015-10-16 MED ORDER — ONDANSETRON HCL 4 MG/2ML IJ SOLN: 4.0000 mg | Freq: Four times a day (QID) | INTRAMUSCULAR | Status: DC | PRN

## 2015-10-16 MED ORDER — POTASSIUM CHLORIDE 10 MEQ/50ML IV SOLN
10.0000 meq | INTRAVENOUS | Status: AC
  Administered 2015-10-16 (×8): 10 meq via INTRAVENOUS
  Filled 2015-10-16 (×9): qty 50

## 2015-10-16 MED ORDER — IPRATROPIUM-ALBUTEROL 0.5-2.5 (3) MG/3ML IN SOLN
3.0000 mL | Freq: Four times a day (QID) | RESPIRATORY_TRACT | Status: DC
  Administered 2015-10-16 – 2015-10-18 (×8): 3 mL via RESPIRATORY_TRACT
  Filled 2015-10-16 (×7): qty 3

## 2015-10-16 MED ORDER — TRACE MINERALS CR-CU-MN-SE-ZN 10-1000-500-60 MCG/ML IV SOLN
INTRAVENOUS | Status: AC
  Administered 2015-10-16: 17:00:00 via INTRAVENOUS
  Filled 2015-10-16: qty 1992

## 2015-10-16 MED ORDER — ACETAMINOPHEN 500 MG PO TABS: 1000.0000 mg | ORAL_TABLET | Freq: Four times a day (QID) | ORAL | Status: DC | PRN

## 2015-10-16 MED ORDER — FENTANYL CITRATE (PF) 100 MCG/2ML IJ SOLN: 100.0000 ug | INTRAMUSCULAR | Status: DC | PRN

## 2015-10-16 MED ORDER — SODIUM CHLORIDE 0.9 % IV SOLN
INTRAVENOUS | Status: DC
  Administered 2015-10-16: 1000 mL via INTRAVENOUS

## 2015-10-16 MED ORDER — INSULIN ASPART 100 UNIT/ML ~~LOC~~ SOLN
0.0000 [IU] | SUBCUTANEOUS | Status: DC
  Administered 2015-10-16: 3 [IU] via SUBCUTANEOUS
  Administered 2015-10-16: 2 [IU] via SUBCUTANEOUS
  Administered 2015-10-16: 15 [IU] via SUBCUTANEOUS
  Administered 2015-10-17 (×6): 5 [IU] via SUBCUTANEOUS
  Administered 2015-10-18 (×3): 3 [IU] via SUBCUTANEOUS

## 2015-10-16 MED ORDER — ANTISEPTIC ORAL RINSE SOLUTION (CORINZ)
7.0000 mL | Freq: Four times a day (QID) | OROMUCOSAL | Status: DC
  Administered 2015-10-16 – 2015-10-18 (×9): 7 mL via OROMUCOSAL

## 2015-10-16 MED ORDER — LACTATED RINGERS IV SOLN
INTRAVENOUS | Status: DC | PRN
  Administered 2015-10-15: 20:00:00 via INTRAVENOUS

## 2015-10-16 MED ORDER — SODIUM CHLORIDE 0.9 % IV SOLN
INTRAVENOUS | Status: DC
  Administered 2015-10-16: 16:00:00 via INTRAVENOUS

## 2015-10-16 MED ORDER — FAT EMULSION 20 % IV EMUL
240.0000 mL | INTRAVENOUS | Status: AC
  Administered 2015-10-16: 240 mL via INTRAVENOUS
  Filled 2015-10-16: qty 250

## 2015-10-16 MED ORDER — BUDESONIDE 0.5 MG/2ML IN SUSP
0.5000 mg | Freq: Two times a day (BID) | RESPIRATORY_TRACT | Status: DC
  Administered 2015-10-16 – 2015-10-18 (×4): 0.5 mg via RESPIRATORY_TRACT
  Filled 2015-10-16 (×4): qty 2

## 2015-10-16 MED ORDER — PANTOPRAZOLE SODIUM 40 MG IV SOLR
40.0000 mg | Freq: Every day | INTRAVENOUS | Status: DC
  Administered 2015-10-16 – 2015-10-18 (×3): 40 mg via INTRAVENOUS
  Filled 2015-10-16 (×3): qty 40

## 2015-10-16 MED ORDER — FENTANYL CITRATE (PF) 100 MCG/2ML IJ SOLN
100.0000 ug | INTRAMUSCULAR | Status: DC | PRN
  Administered 2015-10-16 – 2015-10-18 (×5): 100 ug via INTRAVENOUS
  Filled 2015-10-16 (×5): qty 2

## 2015-10-16 MED ORDER — ACETAMINOPHEN 650 MG RE SUPP: 650.0000 mg | Freq: Four times a day (QID) | RECTAL | Status: DC | PRN

## 2015-10-16 MED ORDER — SODIUM CHLORIDE 0.9% FLUSH: 10.0000 mL | INTRAVENOUS | Status: DC | PRN

## 2015-10-16 MED ORDER — SODIUM CHLORIDE 0.9% FLUSH
10.0000 mL | Freq: Two times a day (BID) | INTRAVENOUS | Status: DC
  Administered 2015-10-16: 10 mL

## 2015-10-16 MED ORDER — PROPOFOL 1000 MG/100ML IV EMUL
0.0000 ug/kg/min | INTRAVENOUS | Status: DC
  Administered 2015-10-16: 15 ug/kg/min via INTRAVENOUS
  Filled 2015-10-16: qty 100

## 2015-10-16 MED ORDER — NICARDIPINE HCL IN NACL 20-0.86 MG/200ML-% IV SOLN: 5.0000 mg/h | INTRAVENOUS | Status: DC

## 2015-10-16 MED ORDER — DEXTROSE 10 % IV SOLN
INTRAVENOUS | Status: DC
  Administered 2015-10-16: 07:00:00 via INTRAVENOUS

## 2015-10-16 MED ORDER — CHLORHEXIDINE GLUCONATE 0.12% ORAL RINSE (MEDLINE KIT)
15.0000 mL | Freq: Two times a day (BID) | OROMUCOSAL | Status: DC
  Administered 2015-10-16 – 2015-10-18 (×6): 15 mL via OROMUCOSAL

## 2015-10-16 NOTE — Progress Notes (Signed)
[facility]  Patient Status: Active pt with [facility] prior to this readmission  [facility] is providing the following services: [facility], MSW, Respiratory Care team, and Home TPN Pharmacy Team. [facility] hospital team will follow Ms. Ashley Ramsey while an inpatient to support the hospital team with DC plans as ordered. Below is the current  Home TPN formula for Ms. Ashley Ramsey.     If patient discharges after hours, please call [phone].   [name] 10/16/2015, 10:31 PM

## 2015-10-16 NOTE — Progress Notes (Signed)
[place] Progress Note Patient Name: Ashley Ramsey DOB: 07-08-1956 MRN: [id number]   Date of Service  10/16/2015  HPI/Events of Note  Returned from IR s/p embolization of bleeding trach tumors.  Now on vent via cuffed trach.  HD stable.  eICU Interventions  Plan: Vent orders F/U ABG Sedation orders SCDs PPI for stress ulcer propy     Intervention Category Intermediate Interventions: Other:  DETERDING,ELIZABETH 10/16/2015, 12:39 AM

## 2015-10-16 NOTE — Transfer of Care (Signed)
Immediate Anesthesia Transfer of Care Note  Patient: Ashley Ramsey  Procedure(s) Performed: Procedure(s): RADIOLOGY WITH ANESTHESIA (N/A)  Patient Location: NICU  Anesthesia Type:General  Level of Consciousness: Patient remains intubated per anesthesia plan  Airway & Oxygen Therapy: Patient remains intubated per anesthesia plan and Patient placed on Ventilator (see vital sign flow sheet for setting)  Post-op Assessment: Report given to RN and Post -op Vital signs reviewed and stable  Post vital signs: Reviewed and stable  Last Vitals:  Filed Vitals:   10/15/15 1721 10/15/15 1935  BP:  156/101  Pulse: 117 98  Temp:    Resp: 20 18    Complications: No apparent anesthesia complications

## 2015-10-16 NOTE — Anesthesia Postprocedure Evaluation (Signed)
Anesthesia Post Note  Patient: Ashley Ramsey  Procedure(s) Performed: Procedure(s) (LRB): RADIOLOGY WITH ANESTHESIA (N/A)  Patient location during evaluation: PACU Anesthesia Type: General Level of consciousness: sedated (Connected to vent by trach) Pain management: pain level controlled Vital Signs Assessment: post-procedure vital signs reviewed and stable Respiratory status: trached- connected to vent. Cardiovascular status: blood pressure returned to baseline and stable Postop Assessment: no signs of nausea or vomiting Anesthetic complications: no    Last Vitals:  Filed Vitals:   10/16/15 0215 10/16/15 0230  BP: 108/73 121/90  Pulse: 96 93  Temp:    Resp: 20 18    Last Pain:  Filed Vitals:   10/16/15 0238  PainSc: 0-No pain                 Tiajuana Amass

## 2015-10-16 NOTE — Progress Notes (Signed)
[facility] ADULT ICU REPLACEMENT PROTOCOL FOR AM LAB REPLACEMENT ONLY  The patient does apply for the [facility] Adult ICU Electrolyte Replacment Protocol based on the criteria listed below:   1. Is GFR >/= 40 ml/min? Yes.    Patient's GFR today is 58 2. Is urine output >/= 0.5 ml/kg/hr for the last 6 hours? Yes.   Patient's UOP is 0.8 ml/kg/hr 3. Is BUN < 60 mg/dL? No.  Patient's BUN today is 28 4. Abnormal electrolyte(s): K 2.8 5. Ordered repletion with: per protocol 6. If a panic level lab has been reported, has the CCM MD in charge been notified? No..   Physician:    Ronda Fairly A 10/16/2015 5:34 AM

## 2015-10-16 NOTE — Progress Notes (Signed)
[place] Progress Note Patient Name: Ashley Ramsey DOB: [birth year] MRN: [id number]   Date of Service  10/16/2015  HPI/Events of Note  Patient with Type II DM  eICU Interventions  Placed on q4 hour SSI - moderate scale.     Intervention Category Intermediate Interventions: Other:  DETERDING,ELIZABETH 10/16/2015, 12:41 AM

## 2015-10-16 NOTE — Progress Notes (Signed)
PARENTERAL NUTRITION CONSULT NOTE - INITIAL  Pharmacy Consult for TPN Indication: resuming home TPN d/t laryngeal cancer  No Known Allergies  Patient Measurements: Weight: 191 lb 12.8 oz (87 kg) Adjusted Body Weight:  Usual Weight:   Vital Signs: Temp: 98 F (36.7 C) (03/05 0809) Temp Source: Oral (03/05 0809) BP: 117/64 mmHg (03/05 0900) Pulse Rate: 92 (03/05 0900) Intake/Output from previous day: 03/04 0701 - 03/05 0700 In: 1536.9 [I.V.:1136.9; IV Piggyback:400] Out: 825 [Urine:775; Blood:50] Intake/Output from this shift: Total I/O In: 265.8 [I.V.:165.8; IV Piggyback:100] Out: 525 [Urine:525]  Labs:  Recent Labs  10/16/15 0032 10/16/15 0254 10/16/15 0743 10/16/15 1021  WBC 16.0* 15.6* 18.1* PENDING  HGB 8.3* 7.8* 7.9* 8.2*  HCT 28.0* 26.1* 26.2* 25.9*  PLT 200 177 188 PENDING  INR 1.26  --   --   --      Recent Labs  10/15/15 1745 10/16/15 0050 10/16/15 0254  NA 150*  --  146*  K 3.4*  --  2.8*  CL 111  --  111  CO2 26  --  23  GLUCOSE 241*  --  299*  BUN 25*  --  28*  CREATININE 1.01*  --  1.04*  CALCIUM 9.5  --  8.6*  MG  --   --  1.9  PHOS  --   --  3.3  TRIG  --  223*  --    Estimated Creatinine Clearance: 60.9 mL/min (by C-G formula based on Cr of 1.04).    Recent Labs  10/16/15 0232 10/16/15 0440 10/16/15 0821  GLUCAP 256* 131* 108*    Medical History: Past Medical History  Diagnosis Date  . Thyroid disease   . Hypertension   . Neck mass hospitalized 08/29/2015  . Type II diabetes mellitus ([place])   . Left kidney mass   . Anemia, chronic disease     Insulin Requirements in the past 24 hours:  15 units SSI  Current Nutrition:  Received TPN orders from [place]: The patient received 2100 ml over 24 hours. The TPN contained electrolytes and 300g (1019 kcal) dextrose, 100g (400 kcal) protein, and 50g (480 kcal) lipids. TE and MV were also added to the bag. No insulin was noted to be in the TPN  Total kcal was 1899 and  total protein was 100g.   Nutritional Goals:  Awaiting RD assessment  Assessment: 60 y/o female admitted with hemoptysis. She has laryngeal cancer and is s/p trach. Hemoptysis coming from trach and is s/p IR embolization bilateral lingual and superior thyroidal arteries. She has been on home TPN due to unclear inability to obtain PEG - GI input planned..  Surgeries/Procedures: 3/4 s/p IR embolization bilateral lingual and superior thyroidal arteries  GI: albumin 2.2. IV PPI Endo: CBGs 131-139, also one 378 on moderate SSI Lytes: K 2.8 - 8 runs ordered per MD, Mg 1.9, Phos 3.3, CoCa ~9.9. NS at  Renal: SCr 1.04, CrCl 60 ml/hr, UOP 2 ml/kg/hr today Pulm: trach Cards: VSS Hepatobil: LFTs and tbili wnl,TG 223 on 3/5 Neuro: GCS 13, RASS -1 (goal 0) ID:  Enterococcal bacteremia on ampicillin, fluconazole Best Practices: MC  TPN Access: Double lumen PICC 10/04/15>> TPN start date:  3/5 (on outpt) >>  Plan:  - Resume Clinimix E 5/15 at a rate of 83 ml/hr and 20% IVFE at a rate of 10 ml/hr to provide 1894 kcal and 100g protein per day (100% kcal and 100% protein compared to home TPN) - Add MV +  TE to the TPN bag - Change NS to [facility] when TPN begins - discussed with K.  - Will f/u TPN labs and RD assessment - Continue moderate SSI q4h and adjust as needed - F/U recommendations for PEG   [facility], [place].D., BCPS Clinical Pharmacist Pager: [phone] 10/16/2015 11:15 AM

## 2015-10-16 NOTE — H&P (Signed)
PULMONARY / CRITICAL CARE MEDICINE HISTORY AND PHYSICAL EXAMINATION   Name: Ashley Ramsey MRN: [id number] DOB: [birth year]    ADMISSION DATE:  10/15/2015  PRIMARY SERVICE: PCCM  CHIEF COMPLAINT:  Hemoptysis  BRIEF PATIENT DESCRIPTION: 60 y/o woman with laryngeal CA s/p trach presenting with hemoptysis from mouth & trach  SIGNIFICANT EVENTS / STUDIES:  >>Seen & scoped by ENT in ED, blood coming from above end of trach.  LINES / TUBES: PIV #6 cuffless Trach (placed by ENT in late Jan '17).  CULTURES: None  ANTIBIOTICS: None  HISTORY OF PRESENT ILLNESS:   Ashley Ramsey is a 60 y/o woman with laryngeal CA s/p tracheostomy in late Jan '17 who presents with hemoptysis. She is alone and unable to use speaking valve so history is limited. She has been on TPN due to inability to obtain a PEG. ENT scoped the patient and identified the source as coming from above the trach and reccommended IR for embolization. PCCM was called for admission.  PAST MEDICAL HISTORY :  Past Medical History  Diagnosis Date  . Thyroid disease   . Hypertension   . Neck mass hospitalized 08/29/2015  . Type II diabetes mellitus ([place])   . Left kidney mass   . Anemia, chronic disease    Past Surgical History  Procedure Laterality Date  . No past surgeries    . Esophagogastroduodenoscopy N/A 09/04/2015    Procedure: ESOPHAGOGASTRODUODENOSCOPY (EGD);  Surgeon: Manus Gunning, MD;  Location: [place] [facility];  Service: Gastroenterology;  Laterality: N/A;  . Direct laryngoscopy N/A 09/02/2015    Procedure: DIRECT LARYNGOSCOPY WITH BIOPSY;  Surgeon: Izora Gala, MD;  Location: [facility];  Service: ENT;  Laterality: N/A;  . Esophagoscopy N/A 09/02/2015    Procedure: ESOPHAGOSCOPY;  Surgeon: Izora Gala, MD;  Location: [place];  Service: ENT;  Laterality: N/A;  . Tracheostomy tube placement N/A 09/04/2015    Procedure: TRACHEOSTOMY;  Surgeon: Izora Gala, MD;  Location: [facility];  Service: ENT;  Laterality: N/A;  . Direct  laryngoscopy  09/04/2015    Procedure: DIRECT View LARYNGOSCOPY with cautery of of tumor;  Surgeon: Izora Gala, MD;  Location: [place];  Service: ENT;;   Prior to Admission medications   Medication Sig Start Date End Date Taking? Authorizing Provider  acetaminophen (TYLENOL) 325 MG suppository Place 1 suppository (325 mg total) rectally every 4 (four) hours as needed for mild pain. Patient taking differently: Place 325 mg rectally every 4 (four) hours as needed for mild pain (nausea).  09/12/15  Yes Thurnell Lose, MD  albuterol (PROVENTIL) (2.5 MG/3ML) 0.083% nebulizer solution Take 3 mLs (2.5 mg total) by nebulization every 6 (six) hours as needed for wheezing or shortness of breath. 09/12/15  Yes Thurnell Lose, MD  ampicillin 2 g in sodium chloride 0.9 % 50 mL Inject 2 g into the vein every 4 (four) hours. 10/11/15  Yes Domenic Polite, MD  fluconazole (DIFLUCAN) 400-0.9 MG/200ML-% IVPB Inject 200 mLs (400 mg total) into the vein daily. Until 3/15 10/11/15 10/26/15 Yes Domenic Polite, MD  HYDROcodone-acetaminophen (NORCO/VICODIN) 5-325 MG tablet Take 1 tablet by mouth every 6 (six) hours as needed for moderate pain. 10/11/15  Yes Domenic Polite, MD  insulin aspart (NOVOLOG) 100 UNIT/ML injection Before each meal 3 times a day, 140-199 - 2 units, 200-250 - 4 units, 251-299 - 6 units,  300-349 - 8 units,  350 or above 10 units. Dispense syringes and needles as needed, Ok to switch to PEN if approved. Substitute  to any brand approved. DX DM2, Code E11.65 Patient taking differently: Inject 2-10 Units into the skin 3 (three) times daily with meals. Before each meal 3 times a day per sliding scale: CBG 140-199 - 2 units, 200-250 - 4 units, 251-299 - 6 units,  300-349 - 8 units,  350 or above 10 units. Dispense syringes and needles as needed, Ok to switch to PEN if approved. Substitute to any brand approved. DX DM2, Code E11.65 09/12/15  Yes Thurnell Lose, MD  NONFORMULARY OR COMPOUNDED ITEM TPN with  electrolytes and Fat emulsion per [place] by protocol via PICC 10/11/15  Yes Domenic Polite, MD  pantoprazole (PROTONIX) 40 MG tablet Take 1 tablet (40 mg total) by mouth daily. 10/13/15  Yes Blanchie Dessert, MD  Blood Glucose Monitoring Suppl (TRUE METRIX METER) DEVI 1 each by Does not apply route 3 (three) times daily before meals. 09/16/15   Arnoldo Morale, MD  glucose blood (FREESTYLE LITE) test strip For glucose testing every before meals at bedtime. Diagnosis E 11.65  Can substitute to any accepted brand 09/12/15   Thurnell Lose, MD  glucose blood (TRUE METRIX BLOOD GLUCOSE TEST) test strip Use 3 times daily before meals 09/16/15   Arnoldo Morale, MD  TRUEPLUS LANCETS 28G MISC 1 each by Does not apply route 3 (three) times daily before meals. 09/16/15   Arnoldo Morale, MD   No Known Allergies  FAMILY HISTORY:  Family History  Problem Relation Age of Onset  . Diabetes Mother   . Hypertension Mother   . Heart disease Mother   . Diabetes Sister   . Hypertension Sister   . Cancer Daughter     Stomach   SOCIAL HISTORY:  reports that she quit smoking about 3 months ago. Her smoking use included Cigarettes. She smoked 0.00 packs per day for 34 years. She has never used smokeless tobacco. She reports that she does not drink alcohol or use illicit drugs.  REVIEW OF SYSTEMS:  Per HPI  SUBJECTIVE:   VITAL SIGNS: Temp:  [97.3 F (36.3 C)-99.2 F (37.3 C)] 97.3 F (36.3 C) (03/05 0026) Pulse Rate:  [93-118] 94 (03/05 0245) Resp:  [14-22] 21 (03/05 0245) BP: (108-158)/(64-101) 138/64 mmHg (03/05 0245) SpO2:  [96 %-100 %] 100 % (03/05 0245) FiO2 (%):  [50 %-100 %] 50 % (03/05 0102) Weight:  [191 lb 12.8 oz (87 kg)] 191 lb 12.8 oz (87 kg) (03/05 0026) HEMODYNAMICS:   VENTILATOR SETTINGS: Vent Mode:  [-] PRVC FiO2 (%):  [50 %-100 %] 50 % Set Rate:  [14 bmp-20 bmp] 20 bmp Vt Set:  [420 mL] 420 mL PEEP:  [5 cmH20] 5 cmH20 Plateau Pressure:  [20 cmH20] 20 cmH20 INTAKE /  OUTPUT: Intake/Output      03/04 0701 - 03/05 0700   I.V. (mL/kg) 717.9 (8.3)   IV Piggyback 250   Total Intake(mL/kg) 967.9 (11.1)   Urine (mL/kg/hr) 350   Blood 50   Total Output 400   Net +567.9         PHYSICAL EXAMINATION: General:  Obese woman with blood coming from mouth, trach Neuro:  Awake, alert HEENT:  Trach in place with copious bleeding from mouth / trach. Neck: No masses at site of trach site. Cardiovascular:  Normal rate, normal S1/S2 Lungs:  CTA Abdomen:  Obese Musculoskeletal:  No deformities Skin:  Lots of redundant skin c/w hx of major weight loss.  LABS:  CBC  Recent Labs Lab 10/15/15 1745 10/16/15 0032 10/16/15 0254  WBC 19.9* 16.0* 15.6*  HGB 10.6* 8.3* 7.8*  HCT 35.1* 28.0* 26.1*  PLT 256 200 177   Coag's  Recent Labs Lab 10/16/15 0032  INR 1.26   BMET  Recent Labs Lab 10/10/15 0502 10/11/15 0330 10/15/15 1745  NA 145 145 150*  K 3.4* 3.7 3.4*  CL 102 106 111  CO2 31 31 26   BUN 7 11 25*  CREATININE 0.89 0.95 1.01*  GLUCOSE 267* 174* 241*   Electrolytes  Recent Labs Lab 10/10/15 0500 10/10/15 0502 10/11/15 0330 10/15/15 1745  CALCIUM  --  8.7* 9.1 9.5  MG 1.7  --  2.0  --   PHOS 2.7  --  4.7*  --    Sepsis Markers No results for input(s): LATICACIDVEN, PROCALCITON, O2SATVEN in the last 168 hours. ABG  Recent Labs Lab 10/16/15 0051  PHART 7.294*  PCO2ART 54.2*  PO2ART 416.0*   Liver Enzymes  Recent Labs Lab 10/11/15 0330  AST 14*  ALT 14  ALKPHOS 105  BILITOT 0.5  ALBUMIN 2.2*   Cardiac Enzymes No results for input(s): TROPONINI, PROBNP in the last 168 hours. Glucose  Recent Labs Lab 10/10/15 1949 10/11/15 0003 10/11/15 0342 10/11/15 0950 10/16/15 0102 10/16/15 0232  GLUCAP 147* 142* 146* 143* 378* 256*    Imaging No results found.  EKG: NSR CXR: None obtained  ASSESSMENT / PLAN:  Active Problems:   Hemoptysis   PULMONARY A: - Hemoptysis, coming from hypopharyx/laryngeal  mass - Need for mechanical ventilation - Threatened airway P:   Will go to IR for embolization Will need to exchange cuffless trach for cuffed - spoke with RT and examined myself. After first attempt resulted in severe fit of coughing / bleeding, aborted procedure and called ENT who exchanged #6 cuffless out for #6 cuffed without incident. No further blood from airway after inflating cuff.  CARDIOVASCULAR A: No acute issues P:    RENAL A: Possible renal mass AKI P:   Will give IVF, reassess  GASTROINTESTINAL A: Hx of GIB Total Parenteral Nutrition P:   Continue home infusion for now.  HEMATOLOGIC A: Cancer, not presently getting treated P:   Discussions of options are underway.  INFECTIOUS A: No current suspicion for infection, but high risk given malignancy and TPN use. P:    ENDOCRINE A: No active issues P:    NEUROLOGIC A: No active issues P:    BEST PRACTICE / DISPOSITION Level of Care:  ICU Primary Service:  PCCM Consultants:  ENT Code Status:  Full Diet:  NPO DVT Px:  None GI Px:  None Skin Integrity:  Intact Social / Family:  Not updated  TODAY'S SUMMARY: 60 y/o woman with massive hemoptysis due to pharyngeal bleed  I have personally obtained a history, examined the patient, evaluated laboratory and imaging results, formulated the assessment and plan and placed orders.  CRITICAL CARE: The patient is critically ill with multiple organ systems failure and requires high complexity decision making for assessment and support, frequent evaluation and titration of therapies, application of advanced monitoring technologies and extensive interpretation of multiple databases. Critical Care Time devoted to patient care services described in this note is 80 minutes.   Luz Brazen, MD Pulmonary and [place] Pager: [phone]   10/16/2015, 3:28 AM

## 2015-10-16 NOTE — Progress Notes (Signed)
eLink Physician-Brief Progress Note Patient Name: MARY CRULL DOB: 03/16/1956 MRN: [id number]   Date of Service  10/16/2015  HPI/Events of Note  Patient agitated.Marland Kitchenattempting to pull lines/vent tubing.  eICU Interventions  Bilateral wrist restraints ordered for patient safety     Intervention Category Major Interventions: Other:  DETERDING,ELIZABETH 10/16/2015, 2:21 AM

## 2015-10-16 NOTE — Progress Notes (Signed)
Utilization Review Completed.Terrie Haring T3/12/2015  

## 2015-10-16 NOTE — Progress Notes (Signed)
Peripherally Inserted Central Catheter/Midline Placement  The IV Nurse has discussed with the patient and/or persons authorized to consent for the patient, the purpose of this procedure and the potential benefits and risks involved with this procedure.  The benefits include less needle sticks, lab draws from the catheter and patient may be discharged home with the catheter.  Risks include, but not limited to, infection, bleeding, blood clot (thrombus formation), and puncture of an artery; nerve damage and irregular heat beat.  Alternatives to this procedure were also discussed.  PICC/Midline Placement Documentation  PICC Double Lumen 10/16/15 PICC Right Brachial 38 cm 0 cm (Active)  Indication for Insertion or Continuance of Line Administration of hyperosmolar/irritating solutions (i.e. TPN, Vancomycin, etc.) 10/16/2015  3:47 PM  Exposed Catheter (cm) 0 cm 10/16/2015  3:47 PM  Site Assessment Clean;Dry;Intact 10/16/2015  3:47 PM  Lumen #1 Status Flushed;Saline locked;Blood return noted 10/16/2015  3:47 PM  Lumen #2 Status Flushed;Saline locked;Blood return noted 10/16/2015  3:47 PM  Dressing Type Transparent 10/16/2015  3:47 PM  Dressing Status Clean;Dry;Intact 10/16/2015  3:47 PM  Dressing Change Due 10/23/15 10/16/2015  3:47 PM       Gordan Payment 10/16/2015, 3:49 PM

## 2015-10-16 NOTE — Progress Notes (Signed)
PULMONARY / CRITICAL CARE MEDICINE   Name: FLODA GONALEZ MRN: [id number] DOB: 09/21/1955    ADMISSION DATE:  10/15/2015 CONSULTATION DATE:  3/5  REFERRING MD:  EDP   CHIEF COMPLAINT:  Hemoptysis   HISTORY OF PRESENT ILLNESS:   Ms. Ramdass is a 60 y/o woman with laryngeal CA s/p tracheostomy in late Jan '17, on chronic TPN due to ?inability to obtain PEG, recent enterococcal bacteremia still on home IV abx.  She presented 3/4 with hemoptysis. ENT scoped the patient and identified the source as coming from above the trach and reccommended IR for embolization. PCCM was called for ICU admission.    SUBJECTIVE:  No further bleeding noted.   VITAL SIGNS: BP 117/64 mmHg  Pulse 92  Temp(Src) 98 F (36.7 C) (Oral)  Resp 12  Wt 87 kg (191 lb 12.8 oz)  SpO2 100%  HEMODYNAMICS:    VENTILATOR SETTINGS: Vent Mode:  [-] PRVC FiO2 (%):  [28 %-100 %] 28 % Set Rate:  [14 bmp-20 bmp] 20 bmp Vt Set:  [420 mL] 420 mL PEEP:  [5 cmH20] 5 cmH20 Plateau Pressure:  [15 cmH20-20 cmH20] 15 cmH20  INTAKE / OUTPUT: I/O last 3 completed shifts: In: 1536.9 [I.V.:1136.9; IV Piggyback:400] Out: 825 [Urine:775; Blood:50]  PHYSICAL EXAMINATION: General:  Pleasant female, NAD  Neuro:  Awake, alert, appropriate  HEENT:  Mm moist, trach intact, c/d, small amt pink tinges sputum, no active bleeding  Cardiovascular:  s1s2 rrr Lungs:  resps even non labored on ATC, few scattered rhonchi  Abdomen:  Round, soft, +bs  Musculoskeletal:  Warm and dry, no edema    LABS:  BMET  Recent Labs Lab 10/11/15 0330 10/15/15 1745 10/16/15 0254  NA 145 150* 146*  K 3.7 3.4* 2.8*  CL 106 111 111  CO2 31 26 23   BUN 11 25* 28*  CREATININE 0.95 1.01* 1.04*  GLUCOSE 174* 241* 299*    Electrolytes  Recent Labs Lab 10/10/15 0500  10/11/15 0330 10/15/15 1745 10/16/15 0254  CALCIUM  --   < > 9.1 9.5 8.6*  MG 1.7  --  2.0  --  1.9  PHOS 2.7  --  4.7*  --  3.3  < > = values in this interval not  displayed.  CBC  Recent Labs Lab 10/16/15 0254 10/16/15 0743 10/16/15 1021  WBC 15.6* 18.1* PENDING  HGB 7.8* 7.9* 8.2*  HCT 26.1* 26.2* 25.9*  PLT 177 188 PENDING    Coag's  Recent Labs Lab 10/16/15 0032  INR 1.26    Sepsis Markers No results for input(s): LATICACIDVEN, PROCALCITON, O2SATVEN in the last 168 hours.  ABG  Recent Labs Lab 10/16/15 0051 10/16/15 0418  PHART 7.294* 7.373  PCO2ART 54.2* 42.1  PO2ART 416.0* 162.0*    Liver Enzymes  Recent Labs Lab 10/11/15 0330  AST 14*  ALT 14  ALKPHOS 105  BILITOT 0.5  ALBUMIN 2.2*    Cardiac Enzymes No results for input(s): TROPONINI, PROBNP in the last 168 hours.  Glucose  Recent Labs Lab 10/11/15 0342 10/11/15 0950 10/16/15 0102 10/16/15 0232 10/16/15 0440 10/16/15 0821  GLUCAP 146* 143* 378* 256* 131* 108*    Imaging No results found.   STUDIES:  ENT flex laryngoscopy 3/4>>> entire hypopharynx is replaced with a large tumor mass. Minimal normal landmarks are visible. There is some slow oozing from the tumor. The scope was also passed down the tracheostomy and distal to the tracheostomy appears to be clear with some blood draining down from  above. There is no tracheal ulceration. There is no palpable neck masses.  CULTURES:   ANTIBIOTICS: Ampicillin 3/5>>> Diflucan 3/5>>>  SIGNIFICANT EVENTS: 3/5 IR embolization bilateral lingual and superior thyroidal arteries   LINES/TUBES: Trach (chronic)   DISCUSSION: 60yo female with laryngeal cancer s/p trach placement in January admitted with hemoptysis s/p IR embolization.   ASSESSMENT / PLAN:  Hemoptysis, coming from hypopharyx/laryngeal mass -- s/p IR embolization  Need for mechanical ventilation P:  ENT following  Cont cuffed trach (cuffless previously)  Cont ATC as tol  Need to discuss oncologic treatment options Trend CBC  SCD's    Possible renal mass AKI P:  Will give IVF, reassess   Hx of GIB Total  Parenteral Nutrition -- ??unable to place PEG.  Unclear why.  P:  Resume TPN  Consider GI input while inpt -- was scheduled for outpt visit to re-eval PEG    Recent enterococcus bacteremia -- remains on home IV abx  No current suspicion for infection, but high risk given malignancy and TPN use. P:  Empiric abx as above  Trend WBC -- stable    FAMILY  - Updates:  Pt updated 3/5   Tx SDU.  Will ask [facility] to assume care 3/6.    Nickolas Madrid, NP 10/16/2015  11:07 AM Pager: [phone] or [phone]

## 2015-10-16 NOTE — Progress Notes (Signed)
Darlina Sicilian NP notified that PICC removed was 44cm as opposed to 45 cm as per documentation.  End of PICC line was a smooth, clean cut without evidence of tearing present. PICC line does appear to be cut at 44cm.  No new orders at this time.  No difficulty exchanging PICC.

## 2015-10-17 ENCOUNTER — Encounter (HOSPITAL_COMMUNITY): Payer: Self-pay | Admitting: Interventional Radiology

## 2015-10-17 ENCOUNTER — Inpatient Hospital Stay (HOSPITAL_COMMUNITY): Payer: Medicaid Other

## 2015-10-17 LAB — COMPREHENSIVE METABOLIC PANEL
ALBUMIN: 1.9 g/dL — AB (ref 3.5–5.0)
ALK PHOS: 130 U/L — AB (ref 38–126)
ALT: 9 U/L — ABNORMAL LOW (ref 14–54)
AST: 16 U/L (ref 15–41)
Anion gap: 10 (ref 5–15)
BILIRUBIN TOTAL: 0.2 mg/dL — AB (ref 0.3–1.2)
BUN: 15 mg/dL (ref 6–20)
CALCIUM: 8.3 mg/dL — AB (ref 8.9–10.3)
CO2: 24 mmol/L (ref 22–32)
Chloride: 110 mmol/L (ref 101–111)
Creatinine, Ser: 0.92 mg/dL (ref 0.44–1.00)
GFR calc Af Amer: >60 mL/min (ref >60)
GFR calc non Af Amer: >60 mL/min (ref >60)
Glucose, Bld: 262 mg/dL — ABNORMAL HIGH (ref 65–99)
Potassium: 3 mmol/L — ABNORMAL LOW (ref 3.5–5.1)
SODIUM: 144 mmol/L (ref 135–145)
TOTAL PROTEIN: 6.4 g/dL — AB (ref 6.5–8.1)

## 2015-10-17 LAB — CBC
HCT: 26.1 % — ABNORMAL LOW (ref 36.0–46.0)
Hemoglobin: 7.9 g/dL — ABNORMAL LOW (ref 12.0–15.0)
MCH: 26.4 pg (ref 26.0–34.0)
MCHC: 30.3 g/dL (ref 30.0–36.0)
MCV: 87.3 fL (ref 78.0–100.0)
PLATELETS: 188 10*3/uL (ref 150–400)
RBC: 2.99 MIL/uL — ABNORMAL LOW (ref 3.87–5.11)
RDW: 16 % — ABNORMAL HIGH (ref 11.5–15.5)
WBC: 18.6 10*3/uL — ABNORMAL HIGH (ref 4.0–10.5)

## 2015-10-17 LAB — TRIGLYCERIDES: Triglycerides: 172 mg/dL — ABNORMAL HIGH (ref <150)

## 2015-10-17 LAB — DIFFERENTIAL
Basophils Absolute: 0 10*3/uL (ref 0.0–0.1)
Basophils Relative: 0 %
Eosinophils Absolute: 0.3 10*3/uL (ref 0.0–0.7)
Eosinophils Relative: 2 %
Lymphocytes Relative: 12 %
Lymphs Abs: 2.2 10*3/uL (ref 0.7–4.0)
MONO ABS: 0.7 10*3/uL (ref 0.1–1.0)
MONOS PCT: 4 %
Neutro Abs: 15.5 10*3/uL — ABNORMAL HIGH (ref 1.7–7.7)
Neutrophils Relative %: 82 %

## 2015-10-17 LAB — GLUCOSE, CAPILLARY
GLUCOSE-CAPILLARY: 225 mg/dL — AB (ref 65–99)
GLUCOSE-CAPILLARY: 229 mg/dL — AB (ref 65–99)
GLUCOSE-CAPILLARY: 240 mg/dL — AB (ref 65–99)
Glucose-Capillary: 230 mg/dL — ABNORMAL HIGH (ref 65–99)
Glucose-Capillary: 208 mg/dL — ABNORMAL HIGH (ref 65–99)
Glucose-Capillary: 213 mg/dL — ABNORMAL HIGH (ref 65–99)

## 2015-10-17 LAB — PREALBUMIN: PREALBUMIN: 9.6 mg/dL — AB (ref 18–38)

## 2015-10-17 LAB — POCT ACTIVATED CLOTTING TIME: ACTIVATED CLOTTING TIME: 204 s

## 2015-10-17 LAB — PHOSPHORUS: Phosphorus: 2.9 mg/dL (ref 2.5–4.6)

## 2015-10-17 LAB — MAGNESIUM: Magnesium: 1.7 mg/dL (ref 1.7–2.4)

## 2015-10-17 MED ORDER — FAT EMULSION 20 % IV EMUL
240.0000 mL | INTRAVENOUS | Status: DC
  Administered 2015-10-17: 240 mL via INTRAVENOUS
  Filled 2015-10-17: qty 250

## 2015-10-17 MED ORDER — TRACE MINERALS CR-CU-MN-SE-ZN 10-1000-500-60 MCG/ML IV SOLN
INTRAVENOUS | Status: DC
  Administered 2015-10-17: 18:00:00 via INTRAVENOUS
  Filled 2015-10-17: qty 1992

## 2015-10-17 MED ORDER — POTASSIUM CHLORIDE 10 MEQ/100ML IV SOLN
10.0000 meq | INTRAVENOUS | Status: AC
  Administered 2015-10-17 (×4): 10 meq via INTRAVENOUS
  Filled 2015-10-17: qty 100

## 2015-10-17 MED ORDER — MAGNESIUM SULFATE IN D5W 10-5 MG/ML-% IV SOLN
1.0000 g | Freq: Once | INTRAVENOUS | Status: AC
  Administered 2015-10-17: 1 g via INTRAVENOUS
  Filled 2015-10-17: qty 100

## 2015-10-17 MED ORDER — POTASSIUM CHLORIDE 10 MEQ/100ML IV SOLN
INTRAVENOUS | Status: AC
  Administered 2015-10-17: 10 meq via INTRAVENOUS
  Filled 2015-10-17: qty 100

## 2015-10-17 NOTE — Progress Notes (Signed)
Initial Nutrition Assessment  DOCUMENTATION CODES:   Obesity unspecified  INTERVENTION:    TPN per pharmacy  NUTRITION DIAGNOSIS:   Inadequate oral intake related to inability to eat as evidenced by NPO status  GOAL:   Patient will meet greater than or equal to 90% of their needs  MONITOR:   Labs, Weight trends, Skin, I & O's, TPN prescription  REASON FOR ASSESSMENT:   Consult New TPN/TNA  ASSESSMENT:   60 y/o Female with laryngeal CA s/p tracheostomy in late Jan '17, on chronic TPN due to ?inability to obtain PEG, recent enterococcal bacteremia still on home IV abx. She presented 3/4 with hemoptysis. ENT scoped the patient and identified the source as coming from above the trach and reccommended IR for embolization. PCCM was called for ICU admission.    Patient s/p procedure 3/4: CEREBRAL ANGIOGRAM  Patient sleeping upon RD visit.  Did not wake.  Known to Clinical Nutrition during previus admissions.  Currently on trach collar.  Pt does not take anything by mouth.    Last MBSS was completed 10/01/15 -- SLP recommended continued NPO status with long term nutrition support.    Patient receives home TPN with [facility].  Patient is receiving TPN with Clinimix E 5/15 @ 83 ml/hr and lipids @  10 ml/hr. Provides 1894 kcal and 100 grams protein per day. Meets 100% minimum estimated energy needs and 100% minimum estimated protein needs.  Diet Order:  Diet NPO time specified TPN (CLINIMIX-E) Adult TPN (CLINIMIX-E) Adult  Skin:   Stage II wound to neck  Last BM:  3/5  Height:   Ht Readings from Last 1 Encounters:  09/23/15 5\' 3"  (1.6 m)    Weight:   Wt Readings from Last 1 Encounters:  10/17/15 198 lb 14.4 oz (90.22 kg)    Ideal Body Weight:  52.2 kg  BMI:  Body mass index is 35.24 kg/(m^2).  Estimated Nutritional Needs:   Kcal:  1900-2100  Protein:  85-100 gm  Fluid:  1.9-2.1 L  EDUCATION NEEDS:   No education needs identified at this  time  Arthur Holms, RD, LDN Pager #: [phone] After-Hours Pager #: [phone]

## 2015-10-17 NOTE — Progress Notes (Signed)
Palliative Medicine RN Note: Pt and family agreed to meet with PMT tomorrow, 3/7, at 1530.  Larina Earthly, RN, BSN, [facility] 10/17/2015 3:38 PM Cell [phone] 8:00-4:00 Monday-Friday Office [phone]

## 2015-10-17 NOTE — Progress Notes (Signed)
Patient ID: Ashley Ramsey, female   DOB: [birth year], 60 y.o.   MRN: [id number]    Referring Physician(s): Ephriam Knuckles  Supervising Physician: Arlean Hopping  Chief Complaint: hemoptysis   Subjective:  Pt doing better today; no sig additional bleeding from trach/hemoptysis since embolization  Allergies: Review of patient's allergies indicates no known allergies.  Medications: Prior to Admission medications   Medication Sig Start Date End Date Taking? Authorizing Provider  acetaminophen (TYLENOL) 325 MG suppository Place 1 suppository (325 mg total) rectally every 4 (four) hours as needed for mild pain. Patient taking differently: Place 325 mg rectally every 4 (four) hours as needed for mild pain (nausea).  09/12/15  Yes Thurnell Lose, MD  albuterol (PROVENTIL) (2.5 MG/3ML) 0.083% nebulizer solution Take 3 mLs (2.5 mg total) by nebulization every 6 (six) hours as needed for wheezing or shortness of breath. 09/12/15  Yes Thurnell Lose, MD  ampicillin 2 g in sodium chloride 0.9 % 50 mL Inject 2 g into the vein every 4 (four) hours. 10/11/15  Yes Domenic Polite, MD  fluconazole (DIFLUCAN) 400-0.9 MG/200ML-% IVPB Inject 200 mLs (400 mg total) into the vein daily. Until 3/15 10/11/15 10/26/15 Yes Domenic Polite, MD  HYDROcodone-acetaminophen (NORCO/VICODIN) 5-325 MG tablet Take 1 tablet by mouth every 6 (six) hours as needed for moderate pain. 10/11/15  Yes Domenic Polite, MD  insulin aspart (NOVOLOG) 100 UNIT/ML injection Before each meal 3 times a day, 140-199 - 2 units, 200-250 - 4 units, 251-299 - 6 units,  300-349 - 8 units,  350 or above 10 units. Dispense syringes and needles as needed, Ok to switch to PEN if approved. Substitute to any brand approved. DX DM2, Code E11.65 Patient taking differently: Inject 2-10 Units into the skin 3 (three) times daily with meals. Before each meal 3 times a day per sliding scale: CBG 140-199 - 2 units, 200-250 - 4 units, 251-299 - 6 units,  300-349 - 8  units,  350 or above 10 units. Dispense syringes and needles as needed, Ok to switch to PEN if approved. Substitute to any brand approved. DX DM2, Code E11.65 09/12/15  Yes Thurnell Lose, MD  NONFORMULARY OR COMPOUNDED ITEM TPN with electrolytes and Fat emulsion per [place] by protocol via PICC 10/11/15  Yes Domenic Polite, MD  pantoprazole (PROTONIX) 40 MG tablet Take 1 tablet (40 mg total) by mouth daily. 10/13/15  Yes Blanchie Dessert, MD  Blood Glucose Monitoring Suppl (TRUE METRIX METER) DEVI 1 each by Does not apply route 3 (three) times daily before meals. 09/16/15   Arnoldo Morale, MD  glucose blood (FREESTYLE LITE) test strip For glucose testing every before meals at bedtime. Diagnosis E 11.65  Can substitute to any accepted brand 09/12/15   Thurnell Lose, MD  glucose blood (TRUE METRIX BLOOD GLUCOSE TEST) test strip Use 3 times daily before meals 09/16/15   Arnoldo Morale, MD  TRUEPLUS LANCETS 28G MISC 1 each by Does not apply route 3 (three) times daily before meals. 09/16/15   Arnoldo Morale, MD     Vital Signs: BP 152/88 mmHg  Pulse 96  Temp(Src) 99 F (37.2 C) (Oral)  Resp 20  Wt 198 lb 14.4 oz (90.22 kg)  SpO2 100%  Physical Exam trach site clean and dry; small amt old blood on recent gauze dressing but no acute bleeding; puncture site rt groin ok, soft,NT, no hematoma  Imaging: Portable Chest Xray  10/17/2015  CLINICAL DATA:  10/16/2015. EXAM: PORTABLE CHEST 1 VIEW  COMPARISON:  None. FINDINGS: Tracheostomy tube in stable position. Right PICC line tip remains in the right atrium. Low lung volumes with basilar atelectasis. No focal pulmonary infiltrate. No pleural effusion or pneumothorax. Heart size normal. No acute osseous abnormality. IMPRESSION: 1. Tracheostomy tube in stable position. Right PICC line tip remains in the right atrium. Again retraction of approximately 4-5 cm should be considered. 2. Lung volumes with mild bibasilar atelectasis. Electronically Signed   By: Marcello Moores   Register   On: 10/17/2015 07:11   Dg Chest Port 1 View  10/16/2015  CLINICAL DATA:  Evaluate PICC line placement EXAM: PORTABLE CHEST 1 VIEW COMPARISON:  10/04/2015 FINDINGS: There is a right PICC line identified. The tip extends about 5 cm beyond the cavoatrial junction into the right atrium. There is no pneumothorax. Tracheostomy tube again identified. Mild atelectatic change at both lung bases with low lung volumes. IMPRESSION: Right PICC line as described with tip well into the right atrium. Suggest retraction by about 4-5 cm and repeat radiograph. Electronically Signed   By: Skipper Cliche M.D.   On: 10/16/2015 13:39    Labs:  CBC:  Recent Labs  10/16/15 0743 10/16/15 1021 10/16/15 1658 10/17/15 0500  WBC 18.1* 24.9* 19.7* 18.6*  HGB 7.9* 8.2* 7.9* 7.9*  HCT 26.2* 25.9* 26.2* 26.1*  PLT 188 198 183 188    COAGS:  Recent Labs  09/04/15 1504 10/16/15 0032  INR 1.34 1.26  APTT 26  --     BMP:  Recent Labs  10/15/15 1745 10/16/15 0254 10/16/15 2100 10/17/15 0500  NA 150* 146* 146* 144  K 3.4* 2.8* 3.4* 3.0*  CL 111 111 112* 110  CO2 26 23 21* 24  GLUCOSE 241* 299* 219* 262*  BUN 25* 28* 15 15  CALCIUM 9.5 8.6* 8.6* 8.3*  CREATININE 1.01* 1.04* 0.95 0.92  GFRNONAA 60* 58* >60 >60  GFRAA >60 >60 >60 >60    LIVER FUNCTION TESTS:  Recent Labs  09/25/15 0430 09/26/15 0919  10/07/15 0420 10/08/15 0525 10/11/15 0330 10/17/15 0500  BILITOT 0.1* 0.8  --   --   --  0.5 0.2*  AST 28 49*  --   --   --  14* 16  ALT 36 46  --   --   --  14 9*  ALKPHOS 144* 246*  --   --   --  105 130*  PROT 5.3* 6.7  --   --   --  7.0 6.4*  ALBUMIN 1.5* 2.0*  < > 2.0* 1.8* 2.2* 1.9*  < > = values in this interval not displayed.  Assessment and Plan: S/P bilateral CCA and ECA arteriograms followed by superselective embolization of bilateral lingual and superior thyroidal arteries using 250 to 350 micron PVA particles and 350 to 500 micron PVA particles 3/4 secondary to  hemoptysis from mouth/trach; currently stable; temp 99; hgb 7.9, WBC 18.6, creat ok, K 3.0- replace; plans per [facility]   Electronically Signed: D. Rowe Robert 10/17/2015, 11:22 AM   I spent a total of 15 minutes at the the patient's bedside AND on the patient's hospital floor or unit, greater than 50% of which was counseling/coordinating care for carotid arteriogram with embolization of lingual/superior thyroidal arteries

## 2015-10-17 NOTE — Progress Notes (Signed)
PARENTERAL NUTRITION CONSULT NOTE - INITIAL  Pharmacy Consult for TPN Indication: resuming home TPN d/t laryngeal cancer  No Known Allergies  Patient Measurements: Weight: 198 lb 14.4 oz (90.22 kg) Adjusted Body Weight:  Usual Weight:   Vital Signs: Temp: 97 F (36.1 C) (03/06 0435) Temp Source: Oral (03/06 0435) BP: 151/87 mmHg (03/06 0435) Pulse Rate: 92 (03/06 0347) Intake/Output from previous day: 03/05 0701 - 03/06 0700 In: 3519.6 [I.V.:1716; IV Piggyback:700; TPN:1103.6] Out: 3900 [Urine:3900] Intake/Output from this shift:    Labs:  Recent Labs  10/16/15 0032  10/16/15 1021 10/16/15 1658 10/17/15 0500  WBC 16.0*  < > 24.9* 19.7* 18.6*  HGB 8.3*  < > 8.2* 7.9* 7.9*  HCT 28.0*  < > 25.9* 26.2* 26.1*  PLT 200  < > 198 183 188  INR 1.26  --   --   --   --   < > = values in this interval not displayed.   Recent Labs  10/16/15 0050 10/16/15 0254 10/16/15 2100 10/17/15 0500  NA  --  146* 146* 144  K  --  2.8* 3.4* 3.0*  CL  --  111 112* 110  CO2  --  23 21* 24  GLUCOSE  --  299* 219* 262*  BUN  --  28* 15 15  CREATININE  --  1.04* 0.95 0.92  CALCIUM  --  8.6* 8.6* 8.3*  MG  --  1.9  --  1.7  PHOS  --  3.3  --  2.9  PROT  --   --   --  6.4*  ALBUMIN  --   --   --  1.9*  AST  --   --   --  16  ALT  --   --   --  9*  ALKPHOS  --   --   --  130*  BILITOT  --   --   --  0.2*  PREALBUMIN  --   --   --  9.6*  TRIG 223*  --   --  172*   Estimated Creatinine Clearance: 70.2 mL/min (by C-G formula based on Cr of 0.92).    Recent Labs  10/16/15 2154 10/16/15 2344 10/17/15 0437  GLUCAP 209* 225* 240*    Medical History: Past Medical History  Diagnosis Date  . Thyroid disease   . Hypertension   . Neck mass hospitalized 08/29/2015  . Type II diabetes mellitus ([place])   . Left kidney mass   . Anemia, chronic disease     Insulin Requirements in the past 24 hours:  15 units of SSI  Current Nutrition:  NPO Clinimix E5/15 @ 83 ml/hr 20% IVFE @  3ml/hr  Nutritional Goals:  Awaiting RD assessment  *Received TPN orders from [place]: The patient had been receiving 2100 ml over 24 hours. The TPN contained electrolytes and 300g (1019 kcal) dextrose, 100g (400 kcal) protein, and 50g (480 kcal) lipids. TE and MV were also added to the bag. No insulin was noted to be in the TPN  Assessment: 60 y/o female admitted with hemoptysis. She has laryngeal cancer and is s/p trach. Hemoptysis coming from trach and is s/p IR embolization bilateral lingual and superior thyroidal arteries. She has been on home TPN due to unclear inability to obtain PEG - GI input planned.  Surgeries/Procedures: 3/4 s/p IR embolization bilateral lingual and superior thyroidal arteries  GI: Albumin low at 1.9. Prealbumin down to 9.6 from 17.1 on 2/28. PPI  Endo: CBGs elevated at 210-240s once TPN started yesterday.  Lytes: wnl exc K low but up to 3.0 after large replacement yesterday. (4 runs ordered today) Phos and Mg ok.  CoCa 9.9. CaxPhos = 29. NS at [facility]   Renal: SCr stable, CrCl ~70 ml/hr.   Pulm: trach  Cards: VSS  Hepatobil: LFTs ok and Tbili 0.2,TG down to 172 on 3/6  Neuro: GCS 13, RASS -1 (goal 0)  ID:  Enterococcal bacteremia on ampicillin, fluconazole. WBC elevated at 18.6.  Best Practices: MC  TPN Access: Double lumen PICC 10/04/15>>  TPN start date:  3/5 (on outpt) >>  Plan:  Continue Clinimix E 5/15 at 83 ml/hr Continue 20% IVFE at 10 ml/hr Continue NS at [facility] This provides 100g of protein and 1894 kcals meeting 100% of protein and kcal needs compared to home TPN Continue MVI and trace elements in TPN Add 20 units of regular insulin to the TPN Continue moderate SSI and adjust as needed Monitor TPN labs, BMet tomorrow F/U GI input on feeding, RD assessment  Elenor Quinones, PharmD, BCPS Clinical Pharmacist Pager [phone] 10/17/2015 7:08 AM

## 2015-10-18 ENCOUNTER — Telehealth: Payer: Self-pay

## 2015-10-18 DIAGNOSIS — C329 Malignant neoplasm of larynx, unspecified: Secondary | ICD-10-CM

## 2015-10-18 LAB — POTASSIUM: Potassium: 3.1 mmol/L — ABNORMAL LOW (ref 3.5–5.1)

## 2015-10-18 LAB — GLUCOSE, CAPILLARY
GLUCOSE-CAPILLARY: 119 mg/dL — AB (ref 65–99)
GLUCOSE-CAPILLARY: 182 mg/dL — AB (ref 65–99)
GLUCOSE-CAPILLARY: 199 mg/dL — AB (ref 65–99)
Glucose-Capillary: 159 mg/dL — ABNORMAL HIGH (ref 65–99)

## 2015-10-18 LAB — MAGNESIUM: Magnesium: 1.9 mg/dL (ref 1.7–2.4)

## 2015-10-18 MED ORDER — TRACE MINERALS CR-CU-MN-SE-ZN 10-1000-500-60 MCG/ML IV SOLN
INTRAVENOUS | Status: DC
  Filled 2015-10-18: qty 1992

## 2015-10-18 MED ORDER — FAT EMULSION 20 % IV EMUL
240.0000 mL | INTRAVENOUS | Status: DC
  Filled 2015-10-18: qty 250

## 2015-10-18 MED ORDER — HEPARIN SOD (PORK) LOCK FLUSH 100 UNIT/ML IV SOLN
250.0000 [IU] | INTRAVENOUS | Status: AC | PRN
  Administered 2015-10-18: 250 [IU]

## 2015-10-18 MED ORDER — POTASSIUM CHLORIDE 10 MEQ/100ML IV SOLN
10.0000 meq | INTRAVENOUS | Status: AC
  Administered 2015-10-18 (×4): 10 meq via INTRAVENOUS
  Filled 2015-10-18 (×4): qty 100

## 2015-10-18 NOTE — Discharge Instructions (Signed)
Follow with Primary MD Arnoldo Morale, MD, your cancer doctor, ENT physician locally and at [facility] as before  in 5-7 days   Get CBC, CMP, 2 view Chest X ray checked  by Primary MD next visit.    Activity: As tolerated with Full fall precautions use walker/cane & assistance as needed   Disposition Home    Diet:   Nothing by mouth, all medications and diet through IV route via PICC line.  For Heart failure patients - Check your Weight same time everyday, if you gain over 2 pounds, or you develop in leg swelling, experience more shortness of breath or chest pain, call your Primary MD immediately. Follow Cardiac Low Salt Diet and 1.5 lit/day fluid restriction.   On your next visit with your primary care physician please Get Medicines reviewed and adjusted.   Please request your Prim.MD to go over all Hospital Tests and Procedure/Radiological results at the follow up, please get all Hospital records sent to your Prim MD by signing hospital release before you go home.   If you experience worsening of your admission symptoms, develop shortness of breath, life threatening emergency, suicidal or homicidal thoughts you must seek medical attention immediately by calling 911 or calling your MD immediately  if symptoms less severe.  You Must read complete instructions/literature along with all the possible adverse reactions/side effects for all the Medicines you take and that have been prescribed to you. Take any new Medicines after you have completely understood and accpet all the possible adverse reactions/side effects.   Do not drive, operating heavy machinery, perform activities at heights, swimming or participation in water activities or provide baby sitting services if your were admitted for syncope or siezures until you have seen by Primary MD or a Neurologist and advised to do so again.  Do not drive when taking Pain medications.    Do not take more than prescribed Pain, Sleep  and Anxiety Medications  Special Instructions: If you have smoked or chewed Tobacco  in the last 2 yrs please stop smoking, stop any regular Alcohol  and or any Recreational drug use.  Wear Seat belts while driving.   Please note  You were cared for by a hospitalist during your hospital stay. If you have any questions about your discharge medications or the care you received while you were in the hospital after you are discharged, you can call the unit and asked to speak with the hospitalist on call if the hospitalist that took care of you is not available. Once you are discharged, your primary care physician will handle any further medical issues. Please note that NO REFILLS for any discharge medications will be authorized once you are discharged, as it is imperative that you return to your primary care physician (or establish a relationship with a primary care physician if you do not have one) for your aftercare needs so that they can reassess your need for medications and monitor your lab values.

## 2015-10-18 NOTE — Progress Notes (Signed)
Patient/family given discharge instructions. [facility] came in to talk to patient. Home health will be there tonight . Patient/family informed of follow up appts. Verbalized understanding. Patient discharged home with family.

## 2015-10-18 NOTE — Telephone Encounter (Signed)
The patient has been followed at the [facility] and an appointment has been scheduled for her on 10/27/15 @ 0900 with Dr Jarold Song and the information has been placed on the AVS. Update provided to [facility], RN CM.

## 2015-10-18 NOTE — Progress Notes (Signed)
PARENTERAL NUTRITION CONSULT NOTE - INITIAL  Pharmacy Consult for TPN Indication: resuming home TPN d/t laryngeal cancer  No Known Allergies  Patient Measurements: Weight: 206 lb 8 oz (93.668 kg) Adjusted Body Weight:  Usual Weight:   Vital Signs: Temp: 98.1 F (36.7 C) (03/07 0325) Temp Source: Oral (03/07 0325) BP: 126/41 mmHg (03/07 0326) Pulse Rate: 88 (03/07 0326) Intake/Output from previous day: 03/06 0701 - 03/07 0700 In: 3059 [I.V.:120; IV Piggyback:800; TPN:2139] Out: 1500 [Urine:1500] Intake/Output from this shift:    Labs:  Recent Labs  10/16/15 0032  10/16/15 1021 10/16/15 1658 10/17/15 0500  WBC 16.0*  < > 24.9* 19.7* 18.6*  HGB 8.3*  < > 8.2* 7.9* 7.9*  HCT 28.0*  < > 25.9* 26.2* 26.1*  PLT 200  < > 198 183 188  INR 1.26  --   --   --   --   < > = values in this interval not displayed.   Recent Labs  10/16/15 0050 10/16/15 0254 10/16/15 2100 10/17/15 0500  NA  --  146* 146* 144  K  --  2.8* 3.4* 3.0*  CL  --  111 112* 110  CO2  --  23 21* 24  GLUCOSE  --  299* 219* 262*  BUN  --  28* 15 15  CREATININE  --  1.04* 0.95 0.92  CALCIUM  --  8.6* 8.6* 8.3*  MG  --  1.9  --  1.7  PHOS  --  3.3  --  2.9  PROT  --   --   --  6.4*  ALBUMIN  --   --   --  1.9*  AST  --   --   --  16  ALT  --   --   --  9*  ALKPHOS  --   --   --  130*  BILITOT  --   --   --  0.2*  PREALBUMIN  --   --   --  9.6*  TRIG 223*  --   --  172*   Estimated Creatinine Clearance: 71.6 mL/min (by C-G formula based on Cr of 0.92).    Recent Labs  10/17/15 1951 10/18/15 0008 10/18/15 0313  GLUCAP 213* 199* 182*    Medical History: Past Medical History  Diagnosis Date  . Thyroid disease   . Hypertension   . Neck mass hospitalized 08/29/2015  . Type II diabetes mellitus ([place])   . Left kidney mass   . Anemia, chronic disease     Insulin Requirements in the past 24 hours:  26 units of SSI 20 units of regular insulin in TPN  Assessment: 60 y/o female  admitted with hemoptysis. She has laryngeal cancer and is s/p trach. Hemoptysis coming from trach and is s/p IR embolization bilateral lingual and superior thyroidal arteries. She has been on home TPN due to unclear inability to obtain PEG - GI input planned.  Surgeries/Procedures: 3/4 s/p IR embolization bilateral lingual and superior thyroidal arteries  GI: Albumin low at 1.9. Prealbumin down to 9.6 from 17.1 on 2/28. PPI  Endo: CBGs elevated at 180-220s on TPN. SSI  Lytes: Todays BMet was canceled by lab due to questionable results? Yesterday lytes were wnl exc K low at 3.0 (4 runs given) Phos and Mg ok (Given 1g yesterday).  CoCa 9.9. CaxPhos = 29. NS at [facility]   Renal: SCr stable, CrCl ~70 ml/hr. UOP ok at 0.50ml/kg/hr but down from the day before  Pulm:  trach  Cards: VSS  Hepatobil: LFTs ok and Tbili 0.2,TG down to 172 on 3/6  Neuro: GCS 13, RASS -1 (goal 0)  ID:  Enterococcal bacteremia on ampicillin, fluconazole. WBC elevated at 18.6.  Best Practices: MC  TPN Access: Double lumen PICC 10/04/15>>  TPN start date:  3/5 (on outpt) >>  Current Nutrition:  NPO Clinimix E5/15 @ 83 ml/hr 20% IVFE @ 37ml/hr  Nutritional Goals: (Per RD on 3/6) Kcal: 1900-2100 Protein: 85-100 g Fluid: 1.9-2.1 L  *Received TPN orders from [place]: The patient had been receiving 2100 ml over 24 hours. The TPN contained electrolytes and 300g (1019 kcal) dextrose, 100g (400 kcal) protein, and 50g (480 kcal) lipids. TE and MV were also added to the bag. No insulin was noted to be in the TPN  Plan:  Continue Clinimix E 5/15 at 83 ml/hr Continue 20% IVFE at 10 ml/hr Continue NS at [facility] This provides 100g of protein and 1894 kcals meeting 100% of protein and kcal needs compared to home TPN Continue MVI and trace elements in TPN Increase to 35 units of regular insulin in the TPN Continue moderate SSI and adjust as needed Monitor TPN labs, recheck BMet tomorrow F/U GI input on  feeding?  Elenor Quinones, PharmD, BCPS Clinical Pharmacist Pager [phone] 10/18/2015 7:06 AM

## 2015-10-18 NOTE — Discharge Summary (Signed)
Ashley Ramsey, is a 60 y.o. female  DOB June 06, 1956  MRN [id number].  Admission date:  10/15/2015  Admitting Physician  Luanne Bras, MD  Discharge Date:  10/18/2015   Primary MD  Arnoldo Morale, MD  Recommendations for primary care physician for things to follow:   Check CBC, CMP, magnesium in 3-4 days. Kindly arrange for below mentioned follow-up visits.   Admission Diagnosis  Laryngeal cancer [facility]) [C32.9]   Discharge Diagnosis  Laryngeal cancer ([place]) [C32.9]     Active Problems:   Hemoptysis   Ventilator dependent [facility])      Past Medical History  Diagnosis Date  . Thyroid disease   . Hypertension   . Neck mass hospitalized 08/29/2015  . Type II diabetes mellitus ([place])   . Left kidney mass   . Anemia, chronic disease     Past Surgical History  Procedure Laterality Date  . No past surgeries    . Esophagogastroduodenoscopy N/A 09/04/2015    Procedure: ESOPHAGOGASTRODUODENOSCOPY (EGD);  Surgeon: Manus Gunning, MD;  Location: [place];  Service: Gastroenterology;  Laterality: N/A;  . Direct laryngoscopy N/A 09/02/2015    Procedure: DIRECT LARYNGOSCOPY WITH BIOPSY;  Surgeon: Izora Gala, MD;  Location: [place];  Service: ENT;  Laterality: N/A;  . Esophagoscopy N/A 09/02/2015    Procedure: ESOPHAGOSCOPY;  Surgeon: Izora Gala, MD;  Location: [place];  Service: ENT;  Laterality: N/A;  . Tracheostomy tube placement N/A 09/04/2015    Procedure: TRACHEOSTOMY;  Surgeon: Izora Gala, MD;  Location: [place];  Service: ENT;  Laterality: N/A;  . Direct laryngoscopy  09/04/2015    Procedure: DIRECT View LARYNGOSCOPY with cautery of of tumor;  Surgeon: Izora Gala, MD;  Location: [place];  Service: ENT;;  . Radiology with anesthesia N/A 10/15/2015    Procedure: RADIOLOGY WITH ANESTHESIA;  Surgeon: Luanne Bras, MD;  Location: [place];  Service: Radiology;  Laterality: N/A;       HPI  from the history and physical done on the day of admission:    Ashley Ramsey is a 60 y/o woman with laryngeal CA s/p tracheostomy in late Jan '17, on chronic TPN due to ?inability to obtain PEG, recent enterococcal bacteremia still on home IV abx. She presented 3/4 with hemoptysis. ENT scoped the patient and identified the source as coming from above the trach and reccommended IR for embolization. PCCM was called for ICU admission. She was eventually seen by IR and she underwent embolization bilateral lingual and superior thyroidal arteries . She was transferred to hospitalist service on 10/17/2015. Note patient was seen on 10/17/2015 but due to a communication error a note was not placed.     Hospital Course:     1. 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 Patient is under the care of Dr. Constance Holster, she also follows with Dr. Alvy Bimler, she supposed to follow with ENT physician at [facility] Dr. Fenton Malling, he is status post tracheostomy, she was admitted this time due to hemoptysis on 10/15/2015,  she was seen by ENT underwent a direct laryngoscopy confirming large laryngeal cancer, she was then seen by IR and she underwent embolization bilateral lingual and superior thyroidal arteries which stopped the bleeding. She is currently on trach collar symptom free eager to go home, will be discharged home with outpatient ENT follow-up locally and at [facility]. She will also follow with her cancer doctor Dr. Alvy Bimler. Overall prognosis is pretty poor and guarded. However patient and family have been resistant to palliative care and hospice.   2. Dysphagia due to #1 above, multiple NG tubes were placed but pulled by patient, last admission and the admission before multiple GI consults were placed with [facility] however [facility] and I are both diffuse to place PEG tube due to  patient's history of gastric bleed requiring embolization few weeks ago, she was then placed on TNA through a PICC line which will be continued upon discharge. She is currently nothing by mouth by mouth.   3. Enterococcal bacteremia and Candida albicans fungemia - this was last admission, she originally had a PICC line on 09/25/2013 which was thought to be the offending agent, this was removed, she was seen by ID and she is currently on IV ampicillin along with fluconazole with stop date of 10/26/2015. This will be continued upon discharge. Her offending PICC line was removed on 09/25/2013 and a new PICC line was placed on 10/04/2015 last admission. She is currently afebrile.   4. Anemia of chronic disease and due to GI bleed in the past. She had history of stomach and duodenal ulcer requiring EGD by lower GI, requiring 5 units of packed RBCs several weeks ago on her admission previous to last along with IV PPI, she also required 2 units of packed RBC last admission few weeks ago on her last admission, her entry no evidence of bleeding. She underwent GI embolization by IR 2 admissions ago. She was followed by [facility] who she will continue to follow post discharge.  5. Left renal mass suspicious for renal cell cancer. Follow with Dr. Alvy Bimler and Dr. Tresa Moore urology, likely nephrectomy later on once acute issues have resolved.   6. DM type II. Continue home regimen of sliding scale.   7. Morbid obesity. Supportive care  8. Hypokalemia. Replaced.   Discharge Condition: Guarded  Follow UP  Follow-up Information    Follow up with Arnoldo Morale, MD. Schedule an appointment as soon as possible for a visit in 3 days.   Specialty:  Family Medicine   Contact information:   [place] [address] [phone]       Follow up with Philomena Doheny, MD. Schedule an appointment as soon as possible for a visit in 3 days.   Specialty:  Plastic Surgery   Contact information:    [place] [id number] [phone]       Follow up with Manus Gunning, MD. Schedule an appointment as soon as possible for a visit in 1 week.   Specialty:  Gastroenterology   Contact information:   [place] [address] [phone]       Follow up with Almond Lint, MD. Schedule an appointment as soon as possible for a visit in 1 week.   Specialty:  Hematology   Contact information:   [address] [place] [address] [phone]       Follow up with Izora Gala, MD. Schedule an appointment as soon  as possible for a visit in 3 days.   Specialty:  Otolaryngology   Contact information:   [address] [place] [address] [phone]        Consults obtained - IR, ENT, PCCM  Diet and Activity recommendation: See Discharge Instructions below  Discharge Instructions       Discharge Instructions    Discharge instructions    Complete by:  As directed   Follow with Primary MD Arnoldo Morale, MD, your cancer doctor, ENT physician locally and at [facility] as before  in 5-7 days   Get CBC, CMP, 2 view Chest X ray checked  by Primary MD next visit.    Activity: As tolerated with Full fall precautions use walker/cane & assistance as needed   Disposition Home    Diet:   Nothing by mouth, all medications and diet through IV route via PICC line.  For Heart failure patients - Check your Weight same time everyday, if you gain over 2 pounds, or you develop in leg swelling, experience more shortness of breath or chest pain, call your Primary MD immediately. Follow Cardiac Low Salt Diet and 1.5 lit/day fluid restriction.   On your next visit with your primary care physician please Get Medicines reviewed and adjusted.   Please request your Prim.MD to go over all Hospital Tests and Procedure/Radiological results at the follow up, please get all Hospital records sent to your Prim  MD by signing hospital release before you go home.   If you experience worsening of your admission symptoms, develop shortness of breath, life threatening emergency, suicidal or homicidal thoughts you must seek medical attention immediately by calling 911 or calling your MD immediately  if symptoms less severe.  You Must read complete instructions/literature along with all the possible adverse reactions/side effects for all the Medicines you take and that have been prescribed to you. Take any new Medicines after you have completely understood and accpet all the possible adverse reactions/side effects.   Do not drive, operating heavy machinery, perform activities at heights, swimming or participation in water activities or provide baby sitting services if your were admitted for syncope or siezures until you have seen by Primary MD or a Neurologist and advised to do so again.  Do not drive when taking Pain medications.    Do not take more than prescribed Pain, Sleep and Anxiety Medications  Special Instructions: If you have smoked or chewed Tobacco  in the last 2 yrs please stop smoking, stop any regular Alcohol  and or any Recreational drug use.  Wear Seat belts while driving.   Please note  You were cared for by a hospitalist during your hospital stay. If you have any questions about your discharge medications or the care you received while you were in the hospital after you are discharged, you can call the unit and asked to speak with the hospitalist on call if the hospitalist that took care of you is not available. Once you are discharged, your primary care physician will handle any further medical issues. Please note that NO REFILLS for any discharge medications will be authorized once you are discharged, as it is imperative that you return to your primary care physician (or establish a relationship with a primary care physician if you do not have one) for your aftercare needs so that they  can reassess your need for medications and monitor your lab values.     Increase activity slowly    Complete  by:  As directed              Discharge Medications       Medication List    STOP taking these medications        HYDROcodone-acetaminophen 5-325 MG tablet  Commonly known as:  NORCO/VICODIN      TAKE these medications        acetaminophen 325 MG suppository  Commonly known as:  TYLENOL  Place 1 suppository (325 mg total) rectally every 4 (four) hours as needed for mild pain.     albuterol (2.5 MG/3ML) 0.083% nebulizer solution  Commonly known as:  PROVENTIL  Take 3 mLs (2.5 mg total) by nebulization every 6 (six) hours as needed for wheezing or shortness of breath.     ampicillin 2 g in sodium chloride 0.9 % 50 mL  Inject 2 g into the vein every 4 (four) hours.     fluconazole 400-0.9 MG/200ML-% IVPB  Commonly known as:  DIFLUCAN  Inject 200 mLs (400 mg total) into the vein daily. Until 3/15     glucose blood test strip  Commonly known as:  FREESTYLE LITE  For glucose testing every before meals at bedtime. Diagnosis E 11.65  Can substitute to any accepted brand     glucose blood test strip  Commonly known as:  TRUE METRIX BLOOD GLUCOSE TEST  Use 3 times daily before meals     insulin aspart 100 UNIT/ML injection  Commonly known as:  NOVOLOG  Before each meal 3 times a day, 140-199 - 2 units, 200-250 - 4 units, 251-299 - 6 units,  300-349 - 8 units,  350 or above 10 units. Dispense syringes and needles as needed, Ok to switch to PEN if approved. Substitute to any brand approved. DX DM2, Code E11.65     NONFORMULARY OR COMPOUNDED ITEM  TPN with electrolytes and Fat emulsion per [place] by protocol via PICC     pantoprazole 40 MG tablet  Commonly known as:  PROTONIX  Take 1 tablet (40 mg total) by mouth daily.     TRUE METRIX METER Devi  1 each by Does not apply route 3 (three) times daily before meals.     TRUEPLUS LANCETS 28G Misc  1 each by  Does not apply route 3 (three) times daily before meals.        Major procedures and Radiology Reports - PLEASE review detailed and final reports for all details, in brief -    ENT flex laryngoscopy 3/4>>> entire hypopharynx is replaced with a large tumor mass. Minimal normal landmarks are visible. There is some slow oozing from the tumor. The scope was also passed down the tracheostomy and distal to the tracheostomy appears to be clear with some blood draining down from above. There is no tracheal ulceration. There is no palpable neck masses   3/5 IR embolization bilateral lingual and superior thyroidal arteries    Dg Abd 1 View  10/07/2015  CLINICAL DATA:  60 year old female undergoing fluoroscopic guided feeding tube placement. Initial encounter. EXAM: ABDOMEN - 1 VIEW COMPARISON:  10/03/2015. FLUOROSCOPY TIME:  7 minutes 0 seconds FINDINGS: Single fluoroscopic image demonstrating feeding tube placement to the distal duodenum. Contrast injection opacifies the distal duodenum and proximal small bowel. 20 mL of water-soluble contrast Omnipaque 300 was utilized. IMPRESSION: Fluoroscopic guided feeding tube placement with tip at the distal duodenum. Electronically Signed   By: Genevie Ann M.D.   On: 10/07/2015 16:04   Dg Abd 1  View  10/03/2015  CLINICAL DATA:  Feeding tube placement. EXAM: ABDOMEN - 1 VIEW COMPARISON:  None. FINDINGS: A feeding tube is seen in place with the tip overlying the distal duodenum near the ligament of Treitz. This is confirmed by injection of contrast and feeding tube which is seen filling the proximal jejunum. IMPRESSION: Feeding tube tip overlying distal duodenum near the ligament of Treitz. Electronically Signed   By: Earle Gell M.D.   On: 10/03/2015 09:27   Portable Chest Xray  10/17/2015  CLINICAL DATA:  10/16/2015. EXAM: PORTABLE CHEST 1 VIEW COMPARISON:  None. FINDINGS: Tracheostomy tube in stable position. Right PICC line tip remains in the right atrium. Low lung  volumes with basilar atelectasis. No focal pulmonary infiltrate. No pleural effusion or pneumothorax. Heart size normal. No acute osseous abnormality. IMPRESSION: 1. Tracheostomy tube in stable position. Right PICC line tip remains in the right atrium. Again retraction of approximately 4-5 cm should be considered. 2. Lung volumes with mild bibasilar atelectasis. Electronically Signed   By: Marcello Moores  Register   On: 10/17/2015 07:11   Dg Chest Port 1 View  10/16/2015  CLINICAL DATA:  Evaluate PICC line placement EXAM: PORTABLE CHEST 1 VIEW COMPARISON:  10/04/2015 FINDINGS: There is a right PICC line identified. The tip extends about 5 cm beyond the cavoatrial junction into the right atrium. There is no pneumothorax. Tracheostomy tube again identified. Mild atelectatic change at both lung bases with low lung volumes. IMPRESSION: Right PICC line as described with tip well into the right atrium. Suggest retraction by about 4-5 cm and repeat radiograph. Electronically Signed   By: Skipper Cliche M.D.   On: 10/16/2015 13:39   Dg Chest Port 1 View  10/04/2015  CLINICAL DATA:  Evaluate central line placement EXAM: PORTABLE CHEST 1 VIEW COMPARISON:  09/25/2015 FINDINGS: Right upper extremity PICC. Tip placement is indeterminate due to underpenetration and artifact from EKG leads. The tip is the level of the right atrium, depth uncertain. Would retracted by 3-4 cm. New feeding tube with tip at least reaching the stomach. Tracheostomy tube is well seated. Improved inflation compared to prior. No pneumonia or edema. IMPRESSION: Right upper extremity PICC with tip at the right atrium, depth uncertain due to technical factors. Suggest retraction by 3-4 cm and repeat radiograph. Electronically Signed   By: Monte Fantasia M.D.   On: 10/04/2015 14:47   Dg Chest Port 1 View  09/25/2015  CLINICAL DATA:  60 year old with recent diagnosis of laryngeal cancer with indwelling tracheostomy, presenting 2 days ago with sepsis.  Worsening hypoxia acutely today. EXAM: PORTABLE CHEST 1 VIEW COMPARISON:  09/23/2015 dating back to 08/29/2015. FINDINGS: Tracheostomy tube in satisfactory position below the thoracic inlet. Markedly suboptimal inspiration with atelectasis in the lung bases. Cardiac silhouette mildly enlarged even allowing for technique and degree of inspiration. Pulmonary venous hypertension and minimal to mild interstitial pulmonary edema, new since 2 days ago. Streaky and patchy opacity at the left lung base. No confluent consolidation elsewhere. Note is made of slight inferior subluxation of the left humeral head relative to the glenoid, not seen on prior examinations, though moderate to severe degenerative changes are present in the left glenohumeral joint. IMPRESSION: 1. Markedly suboptimal inspiration with bibasilar atelectasis. Possible developing bronchopneumonia at the left lung base. 2. Stable cardiomegaly. Pulmonary venous hypertension with minimal to mild interstitial pulmonary edema, query fluid overload. 3. Slight inferior subluxation of the left glenohumeral joint, associated with moderate to severe degenerative changes. Electronically Signed   By: Marcello Moores  Lawrence M.D.   On: 09/25/2015 18:12   Dg Chest Port 1 View  09/23/2015  CLINICAL DATA:  60 year old female with shortness of breath and cough EXAM: PORTABLE CHEST 1 VIEW COMPARISON:  Radiograph dated 09/14/2015 FINDINGS: Right-sided PICC in stable positioning. Tracheostomy with tip above the carina in stable positioning. Single-view of the chest demonstrate a focal area of hazy density at the left lung base, likely atelectatic changes. Pneumonia is less likely but not excluded. Clinical correlation is recommended. The lungs are hypovolemic. No pleural effusion or pneumothorax. Stable cardiac silhouette. No acute osseous pathology. IMPRESSION: Minimal left lung base atelectatic changes versus less likely pneumonia. Clinical correlation recommended.  Electronically Signed   By: Anner Crete M.D.   On: 09/23/2015 18:30   Dg Naso G Tube Plc W/fl-no Rad  10/07/2015  CLINICAL DATA:  NASO G TUBE PLACEMENT WITH FLUORO Fluoroscopy was utilized by the requesting physician.  No radiographic interpretation.   Dg Naso G Tube Plc W/fl-no Rad  10/03/2015  CLINICAL DATA:  NASO G TUBE PLACEMENT WITH FLUORO Fluoroscopy was utilized by the requesting physician.  No radiographic interpretation.   Dg Swallowing Func-speech Pathology  10/01/2015  Objective Swallowing Evaluation: Type of Study: MBS-Modified Barium Swallow Study Patient Details Name: Ashley Ramsey MRN: [id number] Date of Birth: [birth year] Today's Date: 10/01/2015 Time: SLP Start Time (ACUTE ONLY): 1021-SLP Stop Time (ACUTE ONLY): 1050 SLP Time Calculation (min) (ACUTE ONLY): 29 min Past Medical History: Past Medical History Diagnosis Date . Thyroid disease  . Hypertension  . Neck mass hospitalized 08/29/2015 . Type II diabetes mellitus ([place])  . Left kidney mass  . Anemia, chronic disease  Past Surgical History: Past Surgical History Procedure Laterality Date . No past surgeries   . Esophagogastroduodenoscopy N/A 09/04/2015   Procedure: ESOPHAGOGASTRODUODENOSCOPY (EGD);  Surgeon: Manus Gunning, MD;  Location: [place];  Service: Gastroenterology;  Laterality: N/A; . Direct laryngoscopy N/A 09/02/2015   Procedure: DIRECT LARYNGOSCOPY WITH BIOPSY;  Surgeon: Izora Gala, MD;  Location: [place];  Service: ENT;  Laterality: N/A; . Esophagoscopy N/A 09/02/2015   Procedure: ESOPHAGOSCOPY;  Surgeon: Izora Gala, MD;  Location: [place];  Service: ENT;  Laterality: N/A; . Tracheostomy tube placement N/A 09/04/2015   Procedure: TRACHEOSTOMY;  Surgeon: Izora Gala, MD;  Location: [place];  Service: ENT;  Laterality: N/A; . Direct laryngoscopy  09/04/2015   Procedure: DIRECT View LARYNGOSCOPY with cautery of of tumor;  Surgeon: Izora Gala, MD;  Location: [place];  Service: ENT;; HPI: 60 y.o. female with h/o laryngeal  cancer, s/p trach, type 2 DM, who presented to ED due to confusion. Pt problem list includes: severe sepsis possibly due to LLL HCAP, infection in PICC line. Last seen by SLP 1/31 for PMSV tx. Most recent MBSS 1/26 recommended NPO status except for free water protocol under strict guidelines. Pt remained NPO at d/c last month, with nutrition via TPN. Pt reported PMSV use at home as well as PO intake. Subjective: pt alert, eager for food/drink Assessment / Plan / Recommendation CHL IP CLINICAL IMPRESSIONS 10/01/2015 Therapy Diagnosis Moderate pharyngeal phase dysphagia;Severe pharyngeal phase dysphagia Clinical Impression MBS complete. Results generally consistent with most recent MBS results 09/08/15 with potentially slightly greater dysfunction. She continues to exhibit a moderate-severe anatomically based pharyngeal dysphagia resulting in sensory and motor deficits. Primary deficit appears to be decreased laryngeal closure, likely related to laryngeal mass,  and pharyngeal residuals resulting in poor airway protection both during and post swallow with all consistencies assessed. Cough present but  weak and unsuccessful to clear the airway further increasing risk of an aspiration related infection. Rehabilitation of swallow function prior to intervention for tumor guarded due to current size/location. Long term non-oral means of nutrition appropriate at this time. Recommend NPO except for free water protocol under strict guidelines. Pending plan for intervention for tumor, would recommend continued SLP services for maintenance of musculature and potential for a po diet in the future.  Impact on safety and function Severe aspiration risk;Risk for inadequate nutrition/hydration   CHL IP TREATMENT RECOMMENDATION 09/08/2015 Treatment Recommendations Therapy as outlined in treatment plan below   Prognosis 10/01/2015 Prognosis for Safe Diet Advancement Guarded Barriers to Reach Goals Severity of deficits Barriers/Prognosis  Comment -- CHL IP DIET RECOMMENDATION 10/01/2015 SLP Diet Recommendations NPO;Ice chips PRN after oral care;Free water protocol after oral care;Alternative means - long-term Liquid Administration via -- Medication Administration Via alternative means Compensations -- Postural Changes --   CHL IP OTHER RECOMMENDATIONS 10/01/2015 Recommended Consults -- Oral Care Recommendations Oral care QID Other Recommendations --   CHL IP FOLLOW UP RECOMMENDATIONS 10/01/2015 Follow up Recommendations Outpatient SLP   CHL IP FREQUENCY AND DURATION 10/01/2015 Speech Therapy Frequency (ACUTE ONLY) min 2x/week Treatment Duration 2 weeks      CHL IP ORAL PHASE 10/01/2015 Oral Phase WFL Oral - Pudding Teaspoon -- Oral - Pudding Cup -- Oral - Honey Teaspoon -- Oral - Honey Cup -- Oral - Nectar Teaspoon -- Oral - Nectar Cup -- Oral - Nectar Straw -- Oral - Thin Teaspoon -- Oral - Thin Cup -- Oral - Thin Straw -- Oral - Puree -- Oral - Mech Soft -- Oral - Regular -- Oral - Multi-Consistency -- Oral - Pill -- Oral Phase - Comment --  CHL IP PHARYNGEAL PHASE 10/01/2015 Pharyngeal Phase Impaired Pharyngeal- Pudding Teaspoon NT Pharyngeal -- Pharyngeal- Pudding Cup -- Pharyngeal -- Pharyngeal- Honey Teaspoon NT Pharyngeal -- Pharyngeal- Honey Cup NT Pharyngeal -- Pharyngeal- Nectar Teaspoon Reduced airway/laryngeal closure;Reduced epiglottic inversion;Penetration/Aspiration during swallow;Penetration/Apiration after swallow;Pharyngeal residue - valleculae;Pharyngeal residue - pyriform Pharyngeal Material enters airway, passes BELOW cords and not ejected out despite cough attempt by patient Pharyngeal- Nectar Cup -- Pharyngeal -- Pharyngeal- Nectar Straw -- Pharyngeal -- Pharyngeal- Thin Teaspoon Reduced airway/laryngeal closure;Reduced epiglottic inversion;Penetration/Aspiration during swallow;Penetration/Apiration after swallow;Pharyngeal residue - valleculae;Pharyngeal residue - pyriform Pharyngeal Material enters airway, passes BELOW cords and  not ejected out despite cough attempt by patient Pharyngeal- Thin Cup -- Pharyngeal -- Pharyngeal- Thin Straw NT Pharyngeal -- Pharyngeal- Puree Reduced airway/laryngeal closure;Reduced epiglottic inversion;Penetration/Apiration after swallow;Pharyngeal residue - valleculae;Pharyngeal residue - pyriform Pharyngeal -- Pharyngeal- Mechanical Soft -- Pharyngeal -- Pharyngeal- Regular -- Pharyngeal -- Pharyngeal- Multi-consistency -- Pharyngeal -- Pharyngeal- Pill -- Pharyngeal -- Pharyngeal Comment --  CHL IP CERVICAL ESOPHAGEAL PHASE 10/01/2015 Cervical Esophageal Phase (No Data) Pudding Teaspoon -- Pudding Cup -- Honey Teaspoon -- Honey Cup -- Nectar Teaspoon -- Nectar Cup -- Nectar Straw -- Thin Teaspoon -- Thin Cup -- Thin Straw -- Puree -- Mechanical Soft -- Regular -- Multi-consistency -- Pill -- Cervical Esophageal Comment -- No flowsheet data found. Gabriel Rainwater MA, CCC-SLP [phone] Gabriel Rainwater Mer[place] 10/01/2015, 11:44 AM               Micro Results      Recent Results (from the past 240 hour(s))  MRSA PCR Screening     Status: None   Collection Time: 10/16/15 12:30 AM  Result Value Ref Range Status   MRSA by PCR NEGATIVE NEGATIVE Final    Comment:  The GeneXpert MRSA Assay (FDA approved for NASAL specimens only), is one component of a comprehensive MRSA colonization surveillance program. It is not intended to diagnose MRSA infection nor to guide or monitor treatment for MRSA infections.        Today   Subjective    Ashley Ramsey today has no headache,no chest abdominal pain,no new weakness tingling or numbness, feels much better wants to go home today.     Objective   Blood pressure 158/87, pulse 96, temperature 98.8 F (37.1 C), temperature source Oral, resp. rate 17, weight 93.668 kg (206 lb 8 oz), SpO2 100 %.   Intake/Output Summary (Last 24 hours) at 10/18/15 0901 Last data filed at 10/18/15 0700  Gross per 24 hour  Intake   2959 ml  Output   1700 ml  Net    1259 ml    Exam Awake Alert, Oriented x 3, No new F.N deficits, Normal affect [place].AT,PERRAL, trach site stable with trach collar no bleeding Supple Neck,No JVD, No cervical lymphadenopathy appriciated.  Symmetrical Chest wall movement, Good air movement bilaterally, CTAB RRR,No Gallops,Rubs or new Murmurs, No Parasternal Heave +ve B.Sounds, Abd Soft, Non tender, No organomegaly appriciated, No rebound -guarding or rigidity. No Cyanosis, Clubbing or edema, No new Rash or bruise   Data Review   CBC w Diff: Lab Results  Component Value Date   WBC 18.6* 10/17/2015   HGB 7.9* 10/17/2015   HCT 26.1* 10/17/2015   PLT 188 10/17/2015   LYMPHOPCT 12 10/17/2015   MONOPCT 4 10/17/2015   EOSPCT 2 10/17/2015   BASOPCT 0 10/17/2015    CMP: Lab Results  Component Value Date   NA 144 10/17/2015   K 3.1* 10/18/2015   CL 110 10/17/2015   CO2 24 10/17/2015   BUN 15 10/17/2015   CREATININE 0.92 10/17/2015   CREATININE 0.78 08/17/2013   PROT 6.4* 10/17/2015   ALBUMIN 1.9* 10/17/2015   BILITOT 0.2* 10/17/2015   ALKPHOS 130* 10/17/2015   AST 16 10/17/2015   ALT 9* 10/17/2015  .   Total Time in preparing paper work, data evaluation and todays exam - 35 minutes  Thurnell Lose M.D on 10/18/2015 at 9:01 AM  [facility]   Office  [phone]

## 2015-10-19 ENCOUNTER — Telehealth: Payer: Self-pay | Admitting: Family Medicine

## 2015-10-19 NOTE — Telephone Encounter (Signed)
Seth Bake from [facility] called to get verbal orders to continue seeing the pt. For 4 weeks. Please f/u

## 2015-10-20 NOTE — Telephone Encounter (Signed)
Verbal orders given  

## 2015-10-21 ENCOUNTER — Observation Stay (HOSPITAL_COMMUNITY)
Admission: EM | Admit: 2015-10-21 | Discharge: 2015-10-23 | Disposition: A | Discharge status: Home / Self Care | Payer: Medicaid Other | Attending: Internal Medicine | Admitting: Internal Medicine

## 2015-10-21 ENCOUNTER — Encounter: Payer: Self-pay | Admitting: Family Medicine

## 2015-10-21 ENCOUNTER — Encounter (HOSPITAL_COMMUNITY): Payer: Self-pay | Admitting: Emergency Medicine

## 2015-10-21 DIAGNOSIS — Z87891 Personal history of nicotine dependence: Secondary | ICD-10-CM | POA: Diagnosis not present

## 2015-10-21 DIAGNOSIS — R7881 Bacteremia: Secondary | ICD-10-CM

## 2015-10-21 DIAGNOSIS — D638 Anemia in other chronic diseases classified elsewhere: Secondary | ICD-10-CM | POA: Diagnosis not present

## 2015-10-21 DIAGNOSIS — Y828 Other medical devices associated with adverse incidents: Secondary | ICD-10-CM | POA: Insufficient documentation

## 2015-10-21 DIAGNOSIS — I872 Venous insufficiency (chronic) (peripheral): Secondary | ICD-10-CM | POA: Diagnosis not present

## 2015-10-21 DIAGNOSIS — B952 Enterococcus as the cause of diseases classified elsewhere: Secondary | ICD-10-CM | POA: Insufficient documentation

## 2015-10-21 DIAGNOSIS — R000 Tachycardia, unspecified: Secondary | ICD-10-CM | POA: Insufficient documentation

## 2015-10-21 DIAGNOSIS — E119 Type 2 diabetes mellitus without complications: Secondary | ICD-10-CM | POA: Insufficient documentation

## 2015-10-21 DIAGNOSIS — T82898A Other specified complication of vascular prosthetic devices, implants and grafts, initial encounter: Secondary | ICD-10-CM | POA: Diagnosis present

## 2015-10-21 DIAGNOSIS — E876 Hypokalemia: Secondary | ICD-10-CM | POA: Diagnosis not present

## 2015-10-21 DIAGNOSIS — Z452 Encounter for adjustment and management of vascular access device: Secondary | ICD-10-CM | POA: Diagnosis not present

## 2015-10-21 DIAGNOSIS — E860 Dehydration: Secondary | ICD-10-CM | POA: Insufficient documentation

## 2015-10-21 DIAGNOSIS — Z930 Tracheostomy status: Secondary | ICD-10-CM | POA: Diagnosis not present

## 2015-10-21 DIAGNOSIS — C329 Malignant neoplasm of larynx, unspecified: Secondary | ICD-10-CM | POA: Insufficient documentation

## 2015-10-21 DIAGNOSIS — I10 Essential (primary) hypertension: Secondary | ICD-10-CM | POA: Insufficient documentation

## 2015-10-21 DIAGNOSIS — Z794 Long term (current) use of insulin: Secondary | ICD-10-CM | POA: Diagnosis not present

## 2015-10-21 DIAGNOSIS — K219 Gastro-esophageal reflux disease without esophagitis: Secondary | ICD-10-CM | POA: Diagnosis not present

## 2015-10-21 DIAGNOSIS — B49 Unspecified mycosis: Secondary | ICD-10-CM | POA: Diagnosis present

## 2015-10-21 DIAGNOSIS — N2889 Other specified disorders of kidney and ureter: Secondary | ICD-10-CM | POA: Insufficient documentation

## 2015-10-21 DIAGNOSIS — R1313 Dysphagia, pharyngeal phase: Secondary | ICD-10-CM | POA: Diagnosis not present

## 2015-10-21 LAB — BASIC METABOLIC PANEL
ANION GAP: 11 (ref 5–15)
BUN: 23 mg/dL — ABNORMAL HIGH (ref 6–20)
CO2: 25 mmol/L (ref 22–32)
Calcium: 9.1 mg/dL (ref 8.9–10.3)
Chloride: 108 mmol/L (ref 101–111)
Creatinine, Ser: 0.73 mg/dL (ref 0.44–1.00)
GLUCOSE: 278 mg/dL — AB (ref 65–99)
POTASSIUM: 2.8 mmol/L — AB (ref 3.5–5.1)
Sodium: 144 mmol/L (ref 135–145)

## 2015-10-21 LAB — MAGNESIUM: Magnesium: 1.7 mg/dL (ref 1.7–2.4)

## 2015-10-21 LAB — CBC WITH DIFFERENTIAL/PLATELET
Basophils Absolute: 0 10*3/uL (ref 0.0–0.1)
Basophils Relative: 0 %
EOS PCT: 0 %
Eosinophils Absolute: 0 10*3/uL (ref 0.0–0.7)
HEMATOCRIT: 30.3 % — AB (ref 36.0–46.0)
Hemoglobin: 9.5 g/dL — ABNORMAL LOW (ref 12.0–15.0)
LYMPHS PCT: 10 %
Lymphs Abs: 2.6 10*3/uL (ref 0.7–4.0)
MCH: 27.5 pg (ref 26.0–34.0)
MCHC: 31.4 g/dL (ref 30.0–36.0)
MCV: 87.6 fL (ref 78.0–100.0)
MONOS PCT: 5 %
Monocytes Absolute: 1.3 10*3/uL — ABNORMAL HIGH (ref 0.1–1.0)
NEUTROS PCT: 85 %
Neutro Abs: 21.7 10*3/uL — ABNORMAL HIGH (ref 1.7–7.7)
PLATELETS: 321 10*3/uL (ref 150–400)
RBC: 3.46 MIL/uL — AB (ref 3.87–5.11)
RDW: 16.1 % — ABNORMAL HIGH (ref 11.5–15.5)
WBC: 25.6 10*3/uL — AB (ref 4.0–10.5)

## 2015-10-21 LAB — CBG MONITORING, ED: GLUCOSE-CAPILLARY: 141 mg/dL — AB (ref 65–99)

## 2015-10-21 MED ORDER — SODIUM CHLORIDE 0.9 % IV SOLN
INTRAVENOUS | Status: DC
  Administered 2015-10-21 – 2015-10-22 (×2): via INTRAVENOUS

## 2015-10-21 MED ORDER — HYDROCODONE-ACETAMINOPHEN 7.5-325 MG/15ML PO SOLN
10.0000 mL | Freq: Four times a day (QID) | ORAL | Status: DC | PRN
  Administered 2015-10-21: 10 mL via ORAL
  Filled 2015-10-21: qty 15

## 2015-10-21 MED ORDER — ONDANSETRON HCL 4 MG PO TABS: 4.0000 mg | ORAL_TABLET | Freq: Four times a day (QID) | ORAL | Status: DC | PRN

## 2015-10-21 MED ORDER — MORPHINE SULFATE (PF) 2 MG/ML IV SOLN
2.0000 mg | INTRAVENOUS | Status: DC | PRN
  Filled 2015-10-21: qty 1

## 2015-10-21 MED ORDER — INSULIN ASPART 100 UNIT/ML ~~LOC~~ SOLN
0.0000 [IU] | Freq: Three times a day (TID) | SUBCUTANEOUS | Status: DC
  Administered 2015-10-22: 1 [IU] via SUBCUTANEOUS

## 2015-10-21 MED ORDER — PANTOPRAZOLE SODIUM 40 MG PO TBEC
40.0000 mg | DELAYED_RELEASE_TABLET | Freq: Every day | ORAL | Status: DC
  Administered 2015-10-21: 40 mg via ORAL
  Filled 2015-10-21: qty 1

## 2015-10-21 MED ORDER — ONDANSETRON HCL 4 MG/2ML IJ SOLN: 4.0000 mg | Freq: Four times a day (QID) | INTRAMUSCULAR | Status: DC | PRN

## 2015-10-21 MED ORDER — FLUCONAZOLE IN SODIUM CHLORIDE 400-0.9 MG/200ML-% IV SOLN
400.0000 mg | INTRAVENOUS | Status: DC
  Administered 2015-10-23: 400 mg via INTRAVENOUS
  Filled 2015-10-21 (×3): qty 200

## 2015-10-21 MED ORDER — ENOXAPARIN SODIUM 40 MG/0.4ML ~~LOC~~ SOLN
40.0000 mg | Freq: Every day | SUBCUTANEOUS | Status: DC
  Administered 2015-10-21 – 2015-10-22 (×2): 40 mg via SUBCUTANEOUS
  Filled 2015-10-21 (×2): qty 0.4

## 2015-10-21 MED ORDER — ALBUTEROL SULFATE (2.5 MG/3ML) 0.083% IN NEBU: 2.5000 mg | INHALATION_SOLUTION | Freq: Four times a day (QID) | RESPIRATORY_TRACT | Status: DC | PRN

## 2015-10-21 MED ORDER — POTASSIUM CHLORIDE 10 MEQ/100ML IV SOLN
10.0000 meq | INTRAVENOUS | Status: AC
  Administered 2015-10-21 (×2): 10 meq via INTRAVENOUS
  Filled 2015-10-21 (×2): qty 100

## 2015-10-21 MED ORDER — SODIUM CHLORIDE 0.9 % IV SOLN
2.0000 g | INTRAVENOUS | Status: DC
  Administered 2015-10-21 – 2015-10-23 (×10): 2 g via INTRAVENOUS
  Filled 2015-10-21 (×18): qty 2000

## 2015-10-21 MED ORDER — POTASSIUM CHLORIDE CRYS ER 20 MEQ PO TBCR
40.0000 meq | EXTENDED_RELEASE_TABLET | Freq: Once | ORAL | Status: DC
  Filled 2015-10-21: qty 2

## 2015-10-21 MED ORDER — POTASSIUM CHLORIDE 20 MEQ/15ML (10%) PO SOLN
40.0000 meq | Freq: Every day | ORAL | Status: DC
  Administered 2015-10-21 – 2015-10-23 (×2): 40 meq via ORAL
  Filled 2015-10-21 (×5): qty 30

## 2015-10-21 MED ORDER — INSULIN ASPART 100 UNIT/ML ~~LOC~~ SOLN: 0.0000 [IU] | Freq: Every day | SUBCUTANEOUS | Status: DC

## 2015-10-21 MED ORDER — FLUCONAZOLE IN SODIUM CHLORIDE 400-0.9 MG/200ML-% IV SOLN
400.0000 mg | Freq: Once | INTRAVENOUS | Status: AC
  Administered 2015-10-21: 400 mg via INTRAVENOUS
  Filled 2015-10-21: qty 200

## 2015-10-21 MED ORDER — ACETAMINOPHEN 650 MG RE SUPP: 325.0000 mg | RECTAL | Status: DC | PRN

## 2015-10-21 MED ORDER — HYDROCODONE-ACETAMINOPHEN 5-325 MG PO TABS
1.0000 | ORAL_TABLET | Freq: Four times a day (QID) | ORAL | Status: DC | PRN
  Filled 2015-10-21: qty 1

## 2015-10-21 NOTE — Progress Notes (Signed)
[facility] staff texted at [phone] to request pt be followed for d/c needs  Response text received back

## 2015-10-21 NOTE — Progress Notes (Signed)
CM consult for home health services  Pt informs CM she is seen by [facility] Pt mouthing words to CM  Pt requesting BP medication Monitor at bedside indicates SBP 167 Pt sitting up in bed without distress noted except she touched her forehead with her hand when indicating to CM she needed her BP medication  ED RN informed of pt request for BP medication

## 2015-10-21 NOTE — ED Notes (Signed)
Bed: [id number] Expected date:  Expected time:  Means of arrival:  Comments: EMS-picc line out

## 2015-10-21 NOTE — ED Provider Notes (Signed)
CSN: [id number]     Arrival date & time 10/21/15  1510 History   First MD Initiated Contact with Patient 10/21/15 1539     Chief Complaint  Patient presents with  . Vascular Access Problem     (Consider location/radiation/quality/duration/timing/severity/associated sxs/prior Treatment) HPI 60 year old female who presents with PICC line displacement. 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 She has a PICC line placed for IV antibiotics and antifungal as in the setting of fungemia and enterococcal bacteremia in February 2017. Abx stop date 10/26/2015. States that her PICC line was displaced this morning and she has not received any IV antibiotics today. States that she otherwise has been in her usual state of health, denying any fevers, nausea or vomiting, diarrhea, abdominal pain, urinary complaints, night sweats or chills, difficulty breathing.   Past Medical History  Diagnosis Date  . Thyroid disease   . Hypertension   . Neck mass hospitalized 08/29/2015  . Type II diabetes mellitus ([place])   . Left kidney mass   . Anemia, chronic disease    Past Surgical History  Procedure Laterality Date  . No past surgeries    . Esophagogastroduodenoscopy N/A 09/04/2015    Procedure: ESOPHAGOGASTRODUODENOSCOPY (EGD);  Surgeon: Manus Gunning, MD;  Location: [place];  Service: Gastroenterology;  Laterality: N/A;  . Direct laryngoscopy N/A 09/02/2015    Procedure: DIRECT LARYNGOSCOPY WITH BIOPSY;  Surgeon: Izora Gala, MD;  Location: [place];  Service: ENT;  Laterality: N/A;  . Esophagoscopy N/A 09/02/2015    Procedure: ESOPHAGOSCOPY;  Surgeon: Izora Gala, MD;  Location: [place];  Service: ENT;  Laterality: N/A;  . Tracheostomy tube placement N/A 09/04/2015    Procedure: TRACHEOSTOMY;  Surgeon: Izora Gala, MD;  Location: [place];  Service: ENT;  Laterality: N/A;  . Direct laryngoscopy  09/04/2015    Procedure: DIRECT View LARYNGOSCOPY with cautery of of  tumor;  Surgeon: Izora Gala, MD;  Location: [facility];  Service: ENT;;  . Radiology with anesthesia N/A 10/15/2015    Procedure: RADIOLOGY WITH ANESTHESIA;  Surgeon: Luanne Bras, MD;  Location: [facility];  Service: Radiology;  Laterality: N/A;   Family History  Problem Relation Age of Onset  . Diabetes Mother   . Hypertension Mother   . Heart disease Mother   . Diabetes Sister   . Hypertension Sister   . Cancer Daughter     Stomach   Social History  Substance Use Topics  . Smoking status: Former Smoker -- 0.00 packs/day for 34 years    Types: Cigarettes    Quit date: 07/14/2015  . Smokeless tobacco: Never Used  . Alcohol Use: No   OB History    No data available     Review of Systems 10/14 systems reviewed and are negative other than those stated in the HPI    Allergies  Review of patient's allergies indicates no known allergies.  Home Medications   Prior to Admission medications   Medication Sig Start Date End Date Taking? Authorizing Provider  acetaminophen (TYLENOL) 325 MG suppository Place 1 suppository (325 mg total) rectally every 4 (four) hours as needed for mild pain. Patient taking differently: Place 325 mg rectally every 4 (four) hours as needed for mild pain (nausea).  09/12/15  Yes Thurnell Lose, MD  albuterol (PROVENTIL) (2.5 MG/3ML) 0.083% nebulizer solution Take 3 mLs (2.5 mg total) by nebulization every 6 (six) hours as needed for wheezing or shortness of breath. 09/12/15  Yes Thurnell Lose,  MD  ampicillin 2 g in sodium chloride 0.9 % 50 mL Inject 2 g into the vein every 4 (four) hours. 10/11/15  Yes Domenic Polite, MD  fluconazole (DIFLUCAN) 400-0.9 MG/200ML-% IVPB Inject 200 mLs (400 mg total) into the vein daily. Until 3/15 10/11/15 10/26/15 Yes Domenic Polite, MD  insulin aspart (NOVOLOG) 100 UNIT/ML injection Before each meal 3 times a day, 140-199 - 2 units, 200-250 - 4 units, 251-299 - 6 units,  300-349 - 8 units,  350 or above 10 units. Dispense  syringes and needles as needed, Ok to switch to PEN if approved. Substitute to any brand approved. DX DM2, Code E11.65 Patient taking differently: Inject 2-10 Units into the skin 3 (three) times daily with meals. Before each meal 3 times a day per sliding scale: CBG 140-199 - 2 units, 200-250 - 4 units, 251-299 - 6 units,  300-349 - 8 units,  350 or above 10 units. Dispense syringes and needles as needed, Ok to switch to PEN if approved. Substitute to any brand approved. DX DM2, Code E11.65 09/12/15  Yes Thurnell Lose, MD  NONFORMULARY OR COMPOUNDED ITEM TPN with electrolytes and Fat emulsion per [place] by protocol via PICC 10/11/15  Yes Domenic Polite, MD  pantoprazole (PROTONIX) 40 MG tablet Take 1 tablet (40 mg total) by mouth daily. 10/13/15  Yes Blanchie Dessert, MD  Blood Glucose Monitoring Suppl (TRUE METRIX METER) DEVI 1 each by Does not apply route 3 (three) times daily before meals. 09/16/15   Arnoldo Morale, MD  glucose blood (FREESTYLE LITE) test strip For glucose testing every before meals at bedtime. Diagnosis E 11.65  Can substitute to any accepted brand 09/12/15   Thurnell Lose, MD  glucose blood (TRUE METRIX BLOOD GLUCOSE TEST) test strip Use 3 times daily before meals 09/16/15   Arnoldo Morale, MD  TRUEPLUS LANCETS 28G MISC 1 each by Does not apply route 3 (three) times daily before meals. 09/16/15   Arnoldo Morale, MD   BP 136/88 mmHg  Pulse 108  Temp(Src) 98.1 F (36.7 C) (Oral)  Resp 17  SpO2 100% Physical Exam Physical Exam  Nursing note and vitals reviewed. Constitutional: chronically ill appearing, non-toxic, and in no acute distress Head: Normocephalic and atraumatic.  Mouth/Throat: Oropharynx is clear.  Neck: Normal range of motion. Neck supple. Tracheostomy in place Cardiovascular: Tachycardic rate and regular rhythm.   Pulmonary/Chest: Effort normal and breath sounds normal.  Abdominal: Soft. There is no tenderness. There is no rebound and no guarding.   Musculoskeletal: Normal range of motion.  Neurological: Alert, moves all extremities symmetrically Skin: Skin is warm and dry.  Psychiatric: Cooperative  ED Course  Procedures (including critical care time) Labs Review Labs Reviewed  CBC WITH DIFFERENTIAL/PLATELET - Abnormal; Notable for the following:    WBC 25.6 (*)    RBC 3.46 (*)    Hemoglobin 9.5 (*)    HCT 30.3 (*)    RDW 16.1 (*)    Neutro Abs 21.7 (*)    Monocytes Absolute 1.3 (*)    All other components within normal limits  BASIC METABOLIC PANEL - Abnormal; Notable for the following:    Potassium 2.8 (*)    Glucose, Bld 278 (*)    BUN 23 (*)    All other components within normal limits  MAGNESIUM    Imaging Review No results found. I have personally reviewed and evaluated these images and lab results as part of my medical decision-making.   EKG Interpretation None  MDM   Final diagnoses:  Needs peripherally inserted central catheter (PICC)   Presenting for PICC line placement. Is tachycardic, but states that she has chronic dehydration from poor feeding and usually get 1L of IVF via PICC line, which she has not gotten today. Afebrile and she sates that she has felt otherwise in her normal state of health. Discussed with IR who will replace PICC line at [place] at 9 AM tomorrow. Given need for multiple repeated doses of antibiotics will admit overnight to hospitalist service for continued antibiotics. Given dose of ampicillin and fluconazole in ED with IVF. Discussed with Dr. Tamala Julian and admitted to hospitalist service.    Forde Dandy, MD 10/21/15 [phone]

## 2015-10-21 NOTE — ED Notes (Signed)
[facility] presents with a 61 yo female from home whose PICC line has become displaced/pulled.  Not reported how it became dislodged/pulled at this time.  Pt is undergoing treatment for a recently diagnosed "blood infection" as reported by [facility]/not specified what kind of infection but patient is receiving ampicillin every 4 hours with last dose this morning.  Patient BP is 174/94 and is sinus tachycardic at 110 bpm, respiratory rate at 30 bpm and 100% on room air.  Does not appear to be in distress at this time and is ambulatory.

## 2015-10-21 NOTE — ED Notes (Signed)
Respiratory Therapy with the patient. Patient having sings of dyspnea. Lungs have rhonchi/weezes bilaterally

## 2015-10-21 NOTE — Progress Notes (Signed)
RT placed PT on humidified medical air (was 100% on RA)- no 02 used at home per PT. PT allowed RT to place ATC over trach- PT quickly removed and refused to wear. RT placed a new inner cannula (disposable) in PT trach (#6 SH cuffed,trach placed for laryngectomy approx 4-5 wks ago). RT discussed with PT the importance of changing inner cannula when needed (was very occluded). PT now has 1 box of #6 disposable inner cannula for home usage. PT states she is breathing better at this time. RN aware.

## 2015-10-21 NOTE — H&P (Signed)
[facility] History and Physical  Ashley Ramsey [id number] DOB: 12/19/1955 DOA: 10/21/2015  Referring physician: Dr. 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 Patient had a PICC line placed on 3/5 prior to discharge from hospital on 3/7. She had been doing fine at home until administering antibiotics, however today while sleeping the PICC line was somehow dislodged. Patient had not received her antibiotifor today and she notes that her antibiotic stop date is listed as 10/26/2015.  Otherwise denies any complaints at this time, except for some back pain and hemoptysis from tracheostomy which is unchanged. Also reports that she has to usually have a liter IV fluids via the PICC line as she is always dehydrated. Denies having shortness of breath, fevers, diarrhea, constipation, night sweats, chills, shortness of breath, chest pain.  Upon admission patient is evaluated by the ED physician who notified her vaginal radiology who will place PICC line at [facility] at 9 AM tomorrow morning. Patient was started on her home regimen of antibiotics in the ED and will admit to [facility].  Review of Systems: negative for the following  Constitutional: Denies fever, chills, diaphoresis, appetite change and fatigue.  HEENT: Denies photophobia, eye pain, redness, hearing loss, ear pain, congestion, sore throat, rhinorrhea, sneezing, mouth sores, trouble swallowing, neck pain, neck stiffness and tinnitus.  Respiratory: Denies SOB, DOE, cough, chest tightness, and wheezing.  Cardiovascular: Denies chest  pain, palpitations and leg swelling.  Gastrointestinal: Denies nausea, vomiting, abdominal pain, diarrhea, constipation, blood in stool and abdominal distention.  Genitourinary: Denies dysuria, urgency, frequency, hematuria, flank pain and difficulty urinating.  Musculoskeletal: Denies myalgias, joint swelling, arthralgias and gait problem.  Skin: Denies pallor, rash and wound.  Neurological: Denies dizziness, seizures, syncope, weakness, light-headedness, numbness and headaches.  Hematological: Denies adenopathy. Easy bruising, personal or family bleeding history  Psychiatric/Behavioral: Denies suicidal ideation, mood changes, confusion, nervousness, sleep disturbance and agitation       Past Medical History  Diagnosis Date  . Thyroid disease   . Hypertension   . Neck mass hospitalized 08/29/2015  . Type II diabetes mellitus ([place])   . Left kidney mass   . Anemia, chronic disease      Past Surgical History  Procedure Laterality Date  . No past surgeries    . Esophagogastroduodenoscopy N/A 09/04/2015    Procedure: ESOPHAGOGASTRODUODENOSCOPY (EGD);  Surgeon: Manus Gunning, MD;  Location: [place];  Service: Gastroenterology;  Laterality: N/A;  . Direct laryngoscopy N/A 09/02/2015    Procedure: DIRECT LARYNGOSCOPY WITH BIOPSY;  Surgeon: Izora Gala, MD;  Location: [place];  Service: ENT;  Laterality: N/A;  . Esophagoscopy N/A 09/02/2015    Procedure: ESOPHAGOSCOPY;  Surgeon: Izora Gala, MD;  Location: [place];  Service: ENT;  Laterality: N/A;  . Tracheostomy tube placement N/A 09/04/2015    Procedure: TRACHEOSTOMY;  Surgeon: Izora Gala, MD;  Location: [place];  Service: ENT;  Laterality: N/A;  . Direct laryngoscopy  09/04/2015    Procedure: DIRECT View LARYNGOSCOPY with cautery of of tumor;  Surgeon: Izora Gala, MD;  Location: [place];  Service: ENT;;  . Radiology with anesthesia N/A 10/15/2015  Procedure: RADIOLOGY WITH ANESTHESIA;  Surgeon: Luanne Bras, MD;  Location: [place];  Service: Radiology;  Laterality: N/A;      Social History:  reports that she quit smoking about 3 months ago. Her smoking use included Cigarettes. She smoked 0.00 packs per day for 34 years. She has never used smokeless tobacco. She reports that she does not drink alcohol or use illicit drugs. Where does patient live--home   and with whom if at home? Can patient participate in ADLs? Yes  No Known Allergies  Family History  Problem Relation Age of Onset  . Diabetes Mother   . Hypertension Mother   . Heart disease Mother   . Diabetes Sister   . Hypertension Sister   . Cancer Daughter     Stomach      Prior to Admission medications   Medication Sig Start Date End Date Taking? Authorizing Provider  acetaminophen (TYLENOL) 325 MG suppository Place 1 suppository (325 mg total) rectally every 4 (four) hours as needed for mild pain. Patient taking differently: Place 325 mg rectally every 4 (four) hours as needed for mild pain (nausea).  09/12/15  Yes Thurnell Lose, MD  albuterol (PROVENTIL) (2.5 MG/3ML) 0.083% nebulizer solution Take 3 mLs (2.5 mg total) by nebulization every 6 (six) hours as needed for wheezing or shortness of breath. 09/12/15  Yes Thurnell Lose, MD  ampicillin 2 g in sodium chloride 0.9 % 50 mL Inject 2 g into the vein every 4 (four) hours. 10/11/15  Yes Domenic Polite, MD  fluconazole (DIFLUCAN) 400-0.9 MG/200ML-% IVPB Inject 200 mLs (400 mg total) into the vein daily. Until 3/15 10/11/15 10/26/15 Yes Domenic Polite, MD  insulin aspart (NOVOLOG) 100 UNIT/ML injection Before each meal 3 times a day, 140-199 - 2 units, 200-250 - 4 units, 251-299 - 6 units,  300-349 - 8 units,  350 or above 10 units. Dispense syringes and needles as needed, Ok to switch to PEN if approved. Substitute to any brand approved. DX DM2, Code E11.65 Patient taking differently: Inject 2-10 Units into the skin 3 (three) times daily with meals. Before each meal 3 times a day per sliding scale: CBG  140-199 - 2 units, 200-250 - 4 units, 251-299 - 6 units,  300-349 - 8 units,  350 or above 10 units. Dispense syringes and needles as needed, Ok to switch to PEN if approved. Substitute to any brand approved. DX DM2, Code E11.65 09/12/15  Yes Thurnell Lose, MD  NONFORMULARY OR COMPOUNDED ITEM TPN with electrolytes and Fat emulsion per [facility] [facility] by protocol via PICC 10/11/15  Yes Domenic Polite, MD  pantoprazole (PROTONIX) 40 MG tablet Take 1 tablet (40 mg total) by mouth daily. 10/13/15  Yes Blanchie Dessert, MD  Blood Glucose Monitoring Suppl (TRUE METRIX METER) DEVI 1 each by Does not apply route 3 (three) times daily before meals. 09/16/15   Ashley Morale, MD  glucose blood (FREESTYLE LITE) test strip For glucose testing every before meals at bedtime. Diagnosis E 11.65  Can substitute to any accepted brand 09/12/15   Thurnell Lose, MD  glucose blood (TRUE METRIX BLOOD GLUCOSE TEST) test strip Use 3 times daily before meals 09/16/15   Ashley Morale, MD  TRUEPLUS LANCETS 28G MISC 1 each by Does not apply route 3 (three) times daily before meals. 09/16/15   Ashley Morale, MD     Physical Exam: Filed Vitals:   10/21/15 1717 10/21/15 1730 10/21/15 1800 10/21/15 1830  BP: 136/88 183/103 167/98  163/82  Pulse: 108 114    Temp:      TempSrc:      Resp: 17 35 22 29  SpO2: 100% 98%       Constitutional: Vital signs reviewed.  Patient appears older than stated age, but in no acute distress Head: Normocephalic and atraumatic  Ear: TM normal bilaterally  Mouth: no erythema or exudates, MMM  Eyes: PERRL, EOMI, conjunctivae normal, No scleral icterus.  Neck: tracheostomy present with blood-tinged sputum present  Cardiovascular: Tachycardic  Pulmonary/Chest: CTAB, no wheezes, rales, or rhonchi  Abdominal: Soft. Non-tender, non-distended, bowel sounds are normal, no masses, organomegaly, or guarding present.  GU: no CVA tenderness Musculoskeletal: No joint deformities, erythema, or stiffness, ROM full  and no nontender Ext: no edema and no cyanosis, pulses palpable bilaterally (DP and PT)  Hematology: no cervical, inginal, or axillary adenopathy.  Neurological: A&O x3, Strenght is normal and symmetric bilaterally, cranial nerve II-XII are grossly intact, no focal motor deficit, sensory intact to light touch bilaterally.  Skin: Warm, dry and intact. No rash, cyanosis, or clubbing.  Psychiatric: Normal mood and affect. speech and behavior is normal. Judgment and thought content normal. Cognition and memory are normal.      Data Review   Micro Results Recent Results (from the past 240 hour(s))  MRSA PCR Screening     Status: None   Collection Time: 10/16/15 12:30 AM  Result Value Ref Range Status   MRSA by PCR NEGATIVE NEGATIVE Final    Comment:        The GeneXpert MRSA Assay (FDA approved for NASAL specimens only), is one component of a comprehensive MRSA colonization surveillance program. It is not intended to diagnose MRSA infection nor to guide or monitor treatment for MRSA infections.     Radiology Reports Dg Abd 1 View  10/07/2015  CLINICAL DATA:  60 year old female undergoing fluoroscopic guided feeding tube placement. Initial encounter. EXAM: ABDOMEN - 1 VIEW COMPARISON:  10/03/2015. FLUOROSCOPY TIME:  7 minutes 0 seconds FINDINGS: Single fluoroscopic image demonstrating feeding tube placement to the distal duodenum. Contrast injection opacifies the distal duodenum and proximal small bowel. 20 mL of water-soluble contrast Omnipaque 300 was utilized. IMPRESSION: Fluoroscopic guided feeding tube placement with tip at the distal duodenum. Electronically Signed   By: Genevie Ann M.D.   On: 10/07/2015 16:04   Dg Abd 1 View  10/03/2015  CLINICAL DATA:  Feeding tube placement. EXAM: ABDOMEN - 1 VIEW COMPARISON:  None. FINDINGS: A feeding tube is seen in place with the tip overlying the distal duodenum near the ligament of Treitz. This is confirmed by injection of contrast and feeding  tube which is seen filling the proximal jejunum. IMPRESSION: Feeding tube tip overlying distal duodenum near the ligament of Treitz. Electronically Signed   By: Earle Gell M.D.   On: 10/03/2015 09:27   Ir Transcath/emboliz  10/18/2015  INDICATION: History of her pharyngeal mass carcinoma. Patient with uncontrollable bleeding from the tracheostomy site, and from the oropharyngeal mass. EXAM: BILATERAL COMMON CAROTID ARTERIOGRAMS AND BILATERAL EXTERNAL CAROTID ARTERIOGRAMS FOLLOWED BY ENDOVASCULAR SUPERSELECTIVE EMBOLIZATION OF THE SUPERIOR THYROIDAL ARTERIES BILATERALLY, AND THE LINGUAL ARTERIES BILATERALLY WITH 200-300 MICRON PVA PARTICLES AND 350-500 MICRON PVA PARTICLES: MEDICATIONS: Ancef 2 g. The antibiotic was administered within 1 hour of the procedure. ANESTHESIA/SEDATION: General anesthesia provided by the Department of Anesthesiology [facility]. CONTRAST:  80 mL OMNIPAQUE IOHEXOL 300 MG/ML  SOLN FLUOROSCOPY TIME:  Fluoroscopy Time: 64.2 minutes seconds. 2409MG y). COMPLICATIONS: None immediate. PROCEDURE:  Informed consent was obtained from the patient following explanation of the procedure, risks, benefits and alternatives. The patient understands, agrees and consents for the procedure. All questions were addressed. A time out was performed prior to the initiation of the procedure. Maximal barrier sterile technique utilized including caps, mask, sterile gowns, sterile gloves, large sterile drape, hand hygiene, and Betadine prep. Following a full explanation of the procedure along with the potential associated complications, an informed witnessed consent was obtained from the patient and also her sister. Risks of an ischemic stroke of 1% was reviewed with them. After informed consent, patient was put under general anesthesia by the Department of Anesthesiology at [facility]. The right groin was prepped and draped in the usual sterile fashion. Thereafter using modified Seldinger technique,  transfemoral access nto the right common femoral artery was obtained without difficulty. Over a 0.035 inch guidewire a 5 French Pinnacle sheath was inserted. Through this, and also over a 0.035 inch guidewire a 5 [place] JB 1 catheter was advanced to the aortic arch region and selectively positioned in the right common carotid artery, the right external carotid artery, the left common carotid artery and the left external carotid artery. The patient tolerated the procedure well. FINDINGS: The right common carotid arteriogram demonstrates the right external carotid artery and its major branches to be widely patent. Abnormal prominence of the lingual artery and also the superior thyroidal artery leading inferiorly to a hypervascular mass was noted. The right internal carotid artery at the bulb demonstrated a focal shallow plaque without associated stenoses or ulcerations. The right internal carotid artery was seen to opacify normally to the cranial skull base. The petrous, cavernous and the supraclinoid segments are widely patent. A right posterior communicating artery is seen to opacify the right posterior cerebral artery distribution. The right middle and the right anterior cerebral arteries are seen to opacify normally into the capillary and venous phases. A few scattered focal areas of caliber irregularity involving the superior division of the right middle cerebral artery and the pericallosal artery on the right suggest intracranial arteriosclerosis. The left common carotid arteriogram demonstrates the left external carotid artery and its major branches to be widely patent. Again abnormal prominence of the superior thyroidal artery, and the lingual artery is noted. The delayed arterial phase demonstrates an exuberant hypervascular blush in the hypopharyngeal region. The left internal carotid artery at the bulb demonstrates a small ulcerative plaque associated with approximately 40% narrowing by the NASCET criteria.  More distally the vessel is seen to opacify to the cranial skull base. The petrous, the cavernous and the supraclinoid segments are seen to opacify normally. Left posterior communicating artery is seen opacifying the left posterior cerebral artery distribution. The left middle and the left anterior cerebral artery are seen to opacify into the capillary and the venous phases. Again demonstrated are small focal areas of mild caliber irregularity involving the left pericallosal and the left middle cerebral artery superior division. ENDOVASCULAR SUPERSELECTIVE EMBOLIZATION OF ABNORMALLY PROMINENT SUPERIOR THYROIDAL, AND THE LINGUAL ARTERIES BILATERALLY USING PVA PARTICLES: The 5 [place] JB 1 catheter in the right common carotid artery was exchanged for a 0.035 inch 300 cm Rosen exchange guidewire for a 6 French 65 cm Arrow neurovascular sheath using biplane roadmap technique and constant fluoroscopic guidance. Good aspiration was obtained from the hub of the neurovascular sheath. Gentle contrast injection demonstrated no evidence of spasms, dissections or of intraluminal filling defects. This was then connected to continuous heparinized saline infusion. Over the [facility]  exchange guidewire, a 90 cm 6 [place] MDP, Softip curved guide catheter was then advanced and positioned just proximal to the right common carotid artery bifurcation. The guidewire was removed. Good aspiration was obtained from the 6 [place] guide catheter. This was then gently advanced over a 0.035 inch Roadrunner guidewire using biplane roadmap technique and constant fluoroscopic guidance and positioned just proximal to the origin of the superior thyroidal artery, and the lingual artery. At this time using biplane roadmap technique and constant fluoroscopic guidance, in a coaxial manner and with constant heparinized saline infusion, a rapid transit two tip microcatheter was advanced over a 0.014 inch Softip Synchro micro guidewire to the distal end of the 6  [place] guide catheter in the right external carotid artery. Using a torque device and biplane roadmap technique and constant fluoroscopic guidance, the micro guidewire was advanced into the right superior thyroidal artery,. After having established safe positioning of the tip of the microcatheter, and established lack of dangerous communications, embolization of this vessel was then performed using PVA particles of sizes 200-300 microns, and 350-500 microns mixed with 75% contrast and 25% heparinized saline infusion. Embolizations were performed under intermittent fluoroscopy until there was stasis and/or reflux at the tip of the microcatheter. At the end of this, the microcatheter was retrieved more proximally. Control arteriogram performed through the 6 [place] guide catheter demonstrated complete stasis of contrast in the embolized vessel. Similarly, the right lingual artery was then entered using biplane roadmap technique and constant fluoroscopic guidance. Again after having confirmed safe positioning of the tip of the microcatheter, embolization with the PVA particles of sizes 200-300, and 350-500 microns was then performed mixed in with 25% heparinized saline and 75% contrast. An embolization was again performed until there was stasis at the tip of the microcatheter or reflux. The microcatheter was then retrieved slightly proximally. A control arteriogram performed through the microcatheter demonstrated complete angiographic stasis within the embolized vessel. The microcatheter was then gently retrieved and removed. The 6 [place] guide catheter was then gently retrieved into the right common carotid artery. A control arteriogram performed intracranially demonstrated no evidence of vessel occlusions, or of intraluminal filling defects. Attention was now shifted to the left common carotid artery. After having advanced the 6 [place] Arrow sheath inside of which again was the 90 cm 6 [place] MDP curved Softip  BriteTip guide catheter, arteriograms were performed of the left common carotid artery bifurcation and intracranially as described above. The 6 [place] guide catheter catheter was again advanced over a 0.035 inch Roadrunner guidewire using biplane roadmap technique and constant fluoroscopic guidance to just proximal to the origins of the left lingual artery and the left superior thyroidal artery. At this time, using biplane roadmap technique and constant fluoroscopic guidance, in a coaxial manner and with constant heparinized saline infusion, over a 0.014 inch Synchro micro guidewire, access was obtained into the left superior thyroidal artery and left lingual artery as described above. Again after having confirmed safe positioning of the microcatheter tips in the respective vessels eliminating any dangerous communications, embolization was performed with PVA particles of sizes 200-300 microns, and 350-500 microns in each of these vessels until stasis was obtained. Control arteriograms performed after the embolization and in each of these vessels confirmed complete angiographic occlusion with stasis, the microcatheter was retrieved and completed. The 6 French BriteTip guide catheter was then retrieved into the common carotid artery on the left. Arteriograms performed centered intracranially continued to demonstrate no evidence of abnormal filling defects or  vessel occlusions. The 6 [place] Arrow neurovascular sheath and the 6 [place] BriteTip guide catheter were then retrieved into the abdominal aorta and exchanged over a J-Tip guidewire for a 6 [place] Pinnacle sheath. This in itself was then replaced with an external closure device. Hemostasis was achieved at the skin entry site. Throughout the procedure, patient's neurologic status and hemodynamic status remained stable. The patient was then transported to the neuro ICU for further management. IMPRESSION: Endovascular superselective embolization of abnormally  prominent superior thyroidal arteries bilaterally, and the lingual arteries using PVA particles of sizes 200-300 microns, and 350-500 microns with stasis in the embolized vessels as described above. Electronically Signed   By: Luanne Bras M.D.   On: 10/17/2015 16:27   Ir Angiogram Follow Up Study  10/18/2015  INDICATION: History of her pharyngeal mass carcinoma. Patient with uncontrollable bleeding from the tracheostomy site, and from the oropharyngeal mass. EXAM: BILATERAL COMMON CAROTID ARTERIOGRAMS AND BILATERAL EXTERNAL CAROTID ARTERIOGRAMS FOLLOWED BY ENDOVASCULAR SUPERSELECTIVE EMBOLIZATION OF THE SUPERIOR THYROIDAL ARTERIES BILATERALLY, AND THE LINGUAL ARTERIES BILATERALLY WITH 200-300 MICRON PVA PARTICLES AND 350-500 MICRON PVA PARTICLES: MEDICATIONS: Ancef 2 g. The antibiotic was administered within 1 hour of the procedure. ANESTHESIA/SEDATION: General anesthesia provided by the Department of Anesthesiology [facility]. CONTRAST:  80 mL OMNIPAQUE IOHEXOL 300 MG/ML  SOLN FLUOROSCOPY TIME:  Fluoroscopy Time: 64.2 minutes seconds. 2409MG y). COMPLICATIONS: None immediate. PROCEDURE: Informed consent was obtained from the patient following explanation of the procedure, risks, benefits and alternatives. The patient understands, agrees and consents for the procedure. All questions were addressed. A time out was performed prior to the initiation of the procedure. Maximal barrier sterile technique utilized including caps, mask, sterile gowns, sterile gloves, large sterile drape, hand hygiene, and Betadine prep. Following a full explanation of the procedure along with the potential associated complications, an informed witnessed consent was obtained from the patient and also her sister. Risks of an ischemic stroke of 1% was reviewed with them. After informed consent, patient was put under general anesthesia by the Department of Anesthesiology at [facility]. The right groin was prepped and  draped in the usual sterile fashion. Thereafter using modified Seldinger technique, transfemoral access nto the right common femoral artery was obtained without difficulty. Over a 0.035 inch guidewire a 5 French Pinnacle sheath was inserted. Through this, and also over a 0.035 inch guidewire a 5 [place] JB 1 catheter was advanced to the aortic arch region and selectively positioned in the right common carotid artery, the right external carotid artery, the left common carotid artery and the left external carotid artery. The patient tolerated the procedure well. FINDINGS: The right common carotid arteriogram demonstrates the right external carotid artery and its major branches to be widely patent. Abnormal prominence of the lingual artery and also the superior thyroidal artery leading inferiorly to a hypervascular mass was noted. The right internal carotid artery at the bulb demonstrated a focal shallow plaque without associated stenoses or ulcerations. The right internal carotid artery was seen to opacify normally to the cranial skull base. The petrous, cavernous and the supraclinoid segments are widely patent. A right posterior communicating artery is seen to opacify the right posterior cerebral artery distribution. The right middle and the right anterior cerebral arteries are seen to opacify normally into the capillary and venous phases. A few scattered focal areas of caliber irregularity involving the superior division of the right middle cerebral artery and the pericallosal artery on the right suggest intracranial arteriosclerosis. The left common  carotid arteriogram demonstrates the left external carotid artery and its major branches to be widely patent. Again abnormal prominence of the superior thyroidal artery, and the lingual artery is noted. The delayed arterial phase demonstrates an exuberant hypervascular blush in the hypopharyngeal region. The left internal carotid artery at the bulb demonstrates a small  ulcerative plaque associated with approximately 40% narrowing by the NASCET criteria. More distally the vessel is seen to opacify to the cranial skull base. The petrous, the cavernous and the supraclinoid segments are seen to opacify normally. Left posterior communicating artery is seen opacifying the left posterior cerebral artery distribution. The left middle and the left anterior cerebral artery are seen to opacify into the capillary and the venous phases. Again demonstrated are small focal areas of mild caliber irregularity involving the left pericallosal and the left middle cerebral artery superior division. ENDOVASCULAR SUPERSELECTIVE EMBOLIZATION OF ABNORMALLY PROMINENT SUPERIOR THYROIDAL, AND THE LINGUAL ARTERIES BILATERALLY USING PVA PARTICLES: The 5 [place] JB 1 catheter in the right common carotid artery was exchanged for a 0.035 inch 300 cm Rosen exchange guidewire for a 6 French 65 cm Arrow neurovascular sheath using biplane roadmap technique and constant fluoroscopic guidance. Good aspiration was obtained from the hub of the neurovascular sheath. Gentle contrast injection demonstrated no evidence of spasms, dissections or of intraluminal filling defects. This was then connected to continuous heparinized saline infusion. Over the [facility] exchange guidewire, a 90 cm 6 [place] MDP, Softip curved guide catheter was then advanced and positioned just proximal to the right common carotid artery bifurcation. The guidewire was removed. Good aspiration was obtained from the 6 [place] guide catheter. This was then gently advanced over a 0.035 inch Roadrunner guidewire using biplane roadmap technique and constant fluoroscopic guidance and positioned just proximal to the origin of the superior thyroidal artery, and the lingual artery. At this time using biplane roadmap technique and constant fluoroscopic guidance, in a coaxial manner and with constant heparinized saline infusion, a rapid transit two tip microcatheter  was advanced over a 0.014 inch Softip Synchro micro guidewire to the distal end of the 6 [place] guide catheter in the right external carotid artery. Using a torque device and biplane roadmap technique and constant fluoroscopic guidance, the micro guidewire was advanced into the right superior thyroidal artery,. After having established safe positioning of the tip of the microcatheter, and established lack of dangerous communications, embolization of this vessel was then performed using PVA particles of sizes 200-300 microns, and 350-500 microns mixed with 75% contrast and 25% heparinized saline infusion. Embolizations were performed under intermittent fluoroscopy until there was stasis and/or reflux at the tip of the microcatheter. At the end of this, the microcatheter was retrieved more proximally. Control arteriogram performed through the 6 [place] guide catheter demonstrated complete stasis of contrast in the embolized vessel. Similarly, the right lingual artery was then entered using biplane roadmap technique and constant fluoroscopic guidance. Again after having confirmed safe positioning of the tip of the microcatheter, embolization with the PVA particles of sizes 200-300, and 350-500 microns was then performed mixed in with 25% heparinized saline and 75% contrast. An embolization was again performed until there was stasis at the tip of the microcatheter or reflux. The microcatheter was then retrieved slightly proximally. A control arteriogram performed through the microcatheter demonstrated complete angiographic stasis within the embolized vessel. The microcatheter was then gently retrieved and removed. The 6 [place] guide catheter was then gently retrieved into the right common carotid artery. A control arteriogram performed intracranially  demonstrated no evidence of vessel occlusions, or of intraluminal filling defects. Attention was now shifted to the left common carotid artery. After having advanced the 6  [place] Arrow sheath inside of which again was the 90 cm 6 [place] MDP curved Softip BriteTip guide catheter, arteriograms were performed of the left common carotid artery bifurcation and intracranially as described above. The 6 [place] guide catheter catheter was again advanced over a 0.035 inch Roadrunner guidewire using biplane roadmap technique and constant fluoroscopic guidance to just proximal to the origins of the left lingual artery and the left superior thyroidal artery. At this time, using biplane roadmap technique and constant fluoroscopic guidance, in a coaxial manner and with constant heparinized saline infusion, over a 0.014 inch Synchro micro guidewire, access was obtained into the left superior thyroidal artery and left lingual artery as described above. Again after having confirmed safe positioning of the microcatheter tips in the respective vessels eliminating any dangerous communications, embolization was performed with PVA particles of sizes 200-300 microns, and 350-500 microns in each of these vessels until stasis was obtained. Control arteriograms performed after the embolization and in each of these vessels confirmed complete angiographic occlusion with stasis, the microcatheter was retrieved and completed. The 6 French BriteTip guide catheter was then retrieved into the common carotid artery on the left. Arteriograms performed centered intracranially continued to demonstrate no evidence of abnormal filling defects or vessel occlusions. The 6 [place] Arrow neurovascular sheath and the 6 [place] BriteTip guide catheter were then retrieved into the abdominal aorta and exchanged over a J-Tip guidewire for a 6 [place] Pinnacle sheath. This in itself was then replaced with an external closure device. Hemostasis was achieved at the skin entry site. Throughout the procedure, patient's neurologic status and hemodynamic status remained stable. The patient was then transported to the neuro ICU for further  management. IMPRESSION: Endovascular superselective embolization of abnormally prominent superior thyroidal arteries bilaterally, and the lingual arteries using PVA particles of sizes 200-300 microns, and 350-500 microns with stasis in the embolized vessels as described above. Electronically Signed   By: Luanne Bras M.D.   On: 10/17/2015 16:27   Portable Chest Xray  10/17/2015  CLINICAL DATA:  10/16/2015. EXAM: PORTABLE CHEST 1 VIEW COMPARISON:  None. FINDINGS: Tracheostomy tube in stable position. Right PICC line tip remains in the right atrium. Low lung volumes with basilar atelectasis. No focal pulmonary infiltrate. No pleural effusion or pneumothorax. Heart size normal. No acute osseous abnormality. IMPRESSION: 1. Tracheostomy tube in stable position. Right PICC line tip remains in the right atrium. Again retraction of approximately 4-5 cm should be considered. 2. Lung volumes with mild bibasilar atelectasis. Electronically Signed   By: Marcello Moores  Register   On: 10/17/2015 07:11   Dg Chest Port 1 View  10/16/2015  CLINICAL DATA:  Evaluate PICC line placement EXAM: PORTABLE CHEST 1 VIEW COMPARISON:  10/04/2015 FINDINGS: There is a right PICC line identified. The tip extends about 5 cm beyond the cavoatrial junction into the right atrium. There is no pneumothorax. Tracheostomy tube again identified. Mild atelectatic change at both lung bases with low lung volumes. IMPRESSION: Right PICC line as described with tip well into the right atrium. Suggest retraction by about 4-5 cm and repeat radiograph. Electronically Signed   By: Skipper Cliche M.D.   On: 10/16/2015 13:39   Dg Chest Port 1 View  10/04/2015  CLINICAL DATA:  Evaluate central line placement EXAM: PORTABLE CHEST 1 VIEW COMPARISON:  09/25/2015 FINDINGS: Right upper extremity PICC. Tip  placement is indeterminate due to underpenetration and artifact from EKG leads. The tip is the level of the right atrium, depth uncertain. Would retracted by 3-4  cm. New feeding tube with tip at least reaching the stomach. Tracheostomy tube is well seated. Improved inflation compared to prior. No pneumonia or edema. IMPRESSION: Right upper extremity PICC with tip at the right atrium, depth uncertain due to technical factors. Suggest retraction by 3-4 cm and repeat radiograph. Electronically Signed   By: Monte Fantasia M.D.   On: 10/04/2015 14:47   Dg Chest Port 1 View  09/25/2015  CLINICAL DATA:  60 year old with recent diagnosis of laryngeal cancer with indwelling tracheostomy, presenting 2 days ago with sepsis. Worsening hypoxia acutely today. EXAM: PORTABLE CHEST 1 VIEW COMPARISON:  09/23/2015 dating back to 08/29/2015. FINDINGS: Tracheostomy tube in satisfactory position below the thoracic inlet. Markedly suboptimal inspiration with atelectasis in the lung bases. Cardiac silhouette mildly enlarged even allowing for technique and degree of inspiration. Pulmonary venous hypertension and minimal to mild interstitial pulmonary edema, new since 2 days ago. Streaky and patchy opacity at the left lung base. No confluent consolidation elsewhere. Note is made of slight inferior subluxation of the left humeral head relative to the glenoid, not seen on prior examinations, though moderate to severe degenerative changes are present in the left glenohumeral joint. IMPRESSION: 1. Markedly suboptimal inspiration with bibasilar atelectasis. Possible developing bronchopneumonia at the left lung base. 2. Stable cardiomegaly. Pulmonary venous hypertension with minimal to mild interstitial pulmonary edema, query fluid overload. 3. Slight inferior subluxation of the left glenohumeral joint, associated with moderate to severe degenerative changes. Electronically Signed   By: Evangeline Dakin M.D.   On: 09/25/2015 18:12   Dg Chest Port 1 View  09/23/2015  CLINICAL DATA:  60 year old female with shortness of breath and cough EXAM: PORTABLE CHEST 1 VIEW COMPARISON:  Radiograph dated  09/14/2015 FINDINGS: Right-sided PICC in stable positioning. Tracheostomy with tip above the carina in stable positioning. Single-view of the chest demonstrate a focal area of hazy density at the left lung base, likely atelectatic changes. Pneumonia is less likely but not excluded. Clinical correlation is recommended. The lungs are hypovolemic. No pleural effusion or pneumothorax. Stable cardiac silhouette. No acute osseous pathology. IMPRESSION: Minimal left lung base atelectatic changes versus less likely pneumonia. Clinical correlation recommended. Electronically Signed   By: Anner Crete M.D.   On: 09/23/2015 18:30   Dg Naso G Tube Plc W/fl-no Rad  10/07/2015  CLINICAL DATA:  NASO G TUBE PLACEMENT WITH FLUORO Fluoroscopy was utilized by the requesting physician.  No radiographic interpretation.   Dg Naso G Tube Plc W/fl-no Rad  10/03/2015  CLINICAL DATA:  NASO G TUBE PLACEMENT WITH FLUORO Fluoroscopy was utilized by the requesting physician.  No radiographic interpretation.   Dg Swallowing Func-speech Pathology  10/01/2015  Objective Swallowing Evaluation: Type of Study: MBS-Modified Barium Swallow Study Patient Details Name: ALIANNAH DEVALL MRN: [id number] Date of Birth: February 29, 1956 Today's Date: 10/01/2015 Time: SLP Start Time (ACUTE ONLY): 1021-SLP Stop Time (ACUTE ONLY): 1050 SLP Time Calculation (min) (ACUTE ONLY): 29 min Past Medical History: Past Medical History Diagnosis Date . Thyroid disease  . Hypertension  . Neck mass hospitalized 08/29/2015 . Type II diabetes mellitus ([place])  . Left kidney mass  . Anemia, chronic disease  Past Surgical History: Past Surgical History Procedure Laterality Date . No past surgeries   . Esophagogastroduodenoscopy N/A 09/04/2015   Procedure: ESOPHAGOGASTRODUODENOSCOPY (EGD);  Surgeon: Manus Gunning, MD;  Location: [facility]  ENDOSCOPY;  Service: Gastroenterology;  Laterality: N/A; . Direct laryngoscopy N/A 09/02/2015   Procedure: DIRECT LARYNGOSCOPY WITH BIOPSY;   Surgeon: Izora Gala, MD;  Location: [place];  Service: ENT;  Laterality: N/A; . Esophagoscopy N/A 09/02/2015   Procedure: ESOPHAGOSCOPY;  Surgeon: Izora Gala, MD;  Location: [facility];  Service: ENT;  Laterality: N/A; . Tracheostomy tube placement N/A 09/04/2015   Procedure: TRACHEOSTOMY;  Surgeon: Izora Gala, MD;  Location: [facility];  Service: ENT;  Laterality: N/A; . Direct laryngoscopy  09/04/2015   Procedure: DIRECT View LARYNGOSCOPY with cautery of of tumor;  Surgeon: Izora Gala, MD;  Location: [facility];  Service: ENT;; HPI: 60 y.o. female with h/o laryngeal cancer, s/p trach, type 2 DM, who presented to ED due to confusion. Pt problem list includes: severe sepsis possibly due to LLL HCAP, infection in PICC line. Last seen by SLP 1/31 for PMSV tx. Most recent MBSS 1/26 recommended NPO status except for free water protocol under strict guidelines. Pt remained NPO at d/c last month, with nutrition via TPN. Pt reported PMSV use at home as well as PO intake. Subjective: pt alert, eager for food/drink Assessment / Plan / Recommendation CHL IP CLINICAL IMPRESSIONS 10/01/2015 Therapy Diagnosis Moderate pharyngeal phase dysphagia;Severe pharyngeal phase dysphagia Clinical Impression MBS complete. Results generally consistent with most recent MBS results 09/08/15 with potentially slightly greater dysfunction. She continues to exhibit a moderate-severe anatomically based pharyngeal dysphagia resulting in sensory and motor deficits. Primary deficit appears to be decreased laryngeal closure, likely related to laryngeal mass,  and pharyngeal residuals resulting in poor airway protection both during and post swallow with all consistencies assessed. Cough present but weak and unsuccessful to clear the airway further increasing risk of an aspiration related infection. Rehabilitation of swallow function prior to intervention for tumor guarded due to current size/location. Long term non-oral means of nutrition appropriate at this time.  Recommend NPO except for free water protocol under strict guidelines. Pending plan for intervention for tumor, would recommend continued SLP services for maintenance of musculature and potential for a po diet in the future.  Impact on safety and function Severe aspiration risk;Risk for inadequate nutrition/hydration   CHL IP TREATMENT RECOMMENDATION 09/08/2015 Treatment Recommendations Therapy as outlined in treatment plan below   Prognosis 10/01/2015 Prognosis for Safe Diet Advancement Guarded Barriers to Reach Goals Severity of deficits Barriers/Prognosis Comment -- CHL IP DIET RECOMMENDATION 10/01/2015 SLP Diet Recommendations NPO;Ice chips PRN after oral care;Free water protocol after oral care;Alternative means - long-term Liquid Administration via -- Medication Administration Via alternative means Compensations -- Postural Changes --   CHL IP OTHER RECOMMENDATIONS 10/01/2015 Recommended Consults -- Oral Care Recommendations Oral care QID Other Recommendations --   CHL IP FOLLOW UP RECOMMENDATIONS 10/01/2015 Follow up Recommendations Outpatient SLP   CHL IP FREQUENCY AND DURATION 10/01/2015 Speech Therapy Frequency (ACUTE ONLY) min 2x/week Treatment Duration 2 weeks      CHL IP ORAL PHASE 10/01/2015 Oral Phase WFL Oral - Pudding Teaspoon -- Oral - Pudding Cup -- Oral - Honey Teaspoon -- Oral - Honey Cup -- Oral - Nectar Teaspoon -- Oral - Nectar Cup -- Oral - Nectar Straw -- Oral - Thin Teaspoon -- Oral - Thin Cup -- Oral - Thin Straw -- Oral - Puree -- Oral - Mech Soft -- Oral - Regular -- Oral - Multi-Consistency -- Oral - Pill -- Oral Phase - Comment --  CHL IP PHARYNGEAL PHASE 10/01/2015 Pharyngeal Phase Impaired Pharyngeal- Pudding Teaspoon NT Pharyngeal -- Pharyngeal- Pudding Cup --  Pharyngeal -- Pharyngeal- Honey Teaspoon NT Pharyngeal -- Pharyngeal- Honey Cup NT Pharyngeal -- Pharyngeal- Nectar Teaspoon Reduced airway/laryngeal closure;Reduced epiglottic inversion;Penetration/Aspiration during  swallow;Penetration/Apiration after swallow;Pharyngeal residue - valleculae;Pharyngeal residue - pyriform Pharyngeal Material enters airway, passes BELOW cords and not ejected out despite cough attempt by patient Pharyngeal- Nectar Cup -- Pharyngeal -- Pharyngeal- Nectar Straw -- Pharyngeal -- Pharyngeal- Thin Teaspoon Reduced airway/laryngeal closure;Reduced epiglottic inversion;Penetration/Aspiration during swallow;Penetration/Apiration after swallow;Pharyngeal residue - valleculae;Pharyngeal residue - pyriform Pharyngeal Material enters airway, passes BELOW cords and not ejected out despite cough attempt by patient Pharyngeal- Thin Cup -- Pharyngeal -- Pharyngeal- Thin Straw NT Pharyngeal -- Pharyngeal- Puree Reduced airway/laryngeal closure;Reduced epiglottic inversion;Penetration/Apiration after swallow;Pharyngeal residue - valleculae;Pharyngeal residue - pyriform Pharyngeal -- Pharyngeal- Mechanical Soft -- Pharyngeal -- Pharyngeal- Regular -- Pharyngeal -- Pharyngeal- Multi-consistency -- Pharyngeal -- Pharyngeal- Pill -- Pharyngeal -- Pharyngeal Comment --  CHL IP CERVICAL ESOPHAGEAL PHASE 10/01/2015 Cervical Esophageal Phase (No Data) Pudding Teaspoon -- Pudding Cup -- Honey Teaspoon -- Honey Cup -- Nectar Teaspoon -- Nectar Cup -- Nectar Straw -- Thin Teaspoon -- Thin Cup -- Thin Straw -- Puree -- Mechanical Soft -- Regular -- Multi-consistency -- Pill -- Cervical Esophageal Comment -- No flowsheet data found. Gabriel Rainwater MA, CCC-SLP [phone] [place] Leah Meryl 10/01/2015, 11:44 AM              Ir Angio Intra Extracran Sel Com Carotid Innominate Bilat Mod Sed  10/18/2015  INDICATION: History of her pharyngeal mass carcinoma. Patient with uncontrollable bleeding from the tracheostomy site, and from the oropharyngeal mass. EXAM: BILATERAL COMMON CAROTID ARTERIOGRAMS AND BILATERAL EXTERNAL CAROTID ARTERIOGRAMS FOLLOWED BY ENDOVASCULAR SUPERSELECTIVE EMBOLIZATION OF THE SUPERIOR THYROIDAL ARTERIES  BILATERALLY, AND THE LINGUAL ARTERIES BILATERALLY WITH 200-300 MICRON PVA PARTICLES AND 350-500 MICRON PVA PARTICLES: MEDICATIONS: Ancef 2 g. The antibiotic was administered within 1 hour of the procedure. ANESTHESIA/SEDATION: General anesthesia provided by the Department of Anesthesiology [facility]. CONTRAST:  80 mL OMNIPAQUE IOHEXOL 300 MG/ML  SOLN FLUOROSCOPY TIME:  Fluoroscopy Time: 64.2 minutes seconds. 2409MG y). COMPLICATIONS: None immediate. PROCEDURE: Informed consent was obtained from the patient following explanation of the procedure, risks, benefits and alternatives. The patient understands, agrees and consents for the procedure. All questions were addressed. A time out was performed prior to the initiation of the procedure. Maximal barrier sterile technique utilized including caps, mask, sterile gowns, sterile gloves, large sterile drape, hand hygiene, and Betadine prep. Following a full explanation of the procedure along with the potential associated complications, an informed witnessed consent was obtained from the patient and also her sister. Risks of an ischemic stroke of 1% was reviewed with them. After informed consent, patient was put under general anesthesia by the Department of Anesthesiology at [facility]. The right groin was prepped and draped in the usual sterile fashion. Thereafter using modified Seldinger technique, transfemoral access nto the right common femoral artery was obtained without difficulty. Over a 0.035 inch guidewire a 5 French Pinnacle sheath was inserted. Through this, and also over a 0.035 inch guidewire a 5 [place] JB 1 catheter was advanced to the aortic arch region and selectively positioned in the right common carotid artery, the right external carotid artery, the left common carotid artery and the left external carotid artery. The patient tolerated the procedure well. FINDINGS: The right common carotid arteriogram demonstrates the right external  carotid artery and its major branches to be widely patent. Abnormal prominence of the lingual artery and also the superior thyroidal artery leading inferiorly to a  hypervascular mass was noted. The right internal carotid artery at the bulb demonstrated a focal shallow plaque without associated stenoses or ulcerations. The right internal carotid artery was seen to opacify normally to the cranial skull base. The petrous, cavernous and the supraclinoid segments are widely patent. A right posterior communicating artery is seen to opacify the right posterior cerebral artery distribution. The right middle and the right anterior cerebral arteries are seen to opacify normally into the capillary and venous phases. A few scattered focal areas of caliber irregularity involving the superior division of the right middle cerebral artery and the pericallosal artery on the right suggest intracranial arteriosclerosis. The left common carotid arteriogram demonstrates the left external carotid artery and its major branches to be widely patent. Again abnormal prominence of the superior thyroidal artery, and the lingual artery is noted. The delayed arterial phase demonstrates an exuberant hypervascular blush in the hypopharyngeal region. The left internal carotid artery at the bulb demonstrates a small ulcerative plaque associated with approximately 40% narrowing by the NASCET criteria. More distally the vessel is seen to opacify to the cranial skull base. The petrous, the cavernous and the supraclinoid segments are seen to opacify normally. Left posterior communicating artery is seen opacifying the left posterior cerebral artery distribution. The left middle and the left anterior cerebral artery are seen to opacify into the capillary and the venous phases. Again demonstrated are small focal areas of mild caliber irregularity involving the left pericallosal and the left middle cerebral artery superior division. ENDOVASCULAR  SUPERSELECTIVE EMBOLIZATION OF ABNORMALLY PROMINENT SUPERIOR THYROIDAL, AND THE LINGUAL ARTERIES BILATERALLY USING PVA PARTICLES: The 5 [place] JB 1 catheter in the right common carotid artery was exchanged for a 0.035 inch 300 cm Rosen exchange guidewire for a 6 [place] 65 cm Arrow neurovascular sheath using biplane roadmap technique and constant fluoroscopic guidance. Good aspiration was obtained from the hub of the neurovascular sheath. Gentle contrast injection demonstrated no evidence of spasms, dissections or of intraluminal filling defects. This was then connected to continuous heparinized saline infusion. Over the [facility] exchange guidewire, a 90 cm 6 [place] MDP, Softip curved guide catheter was then advanced and positioned just proximal to the right common carotid artery bifurcation. The guidewire was removed. Good aspiration was obtained from the 6 [place] guide catheter. This was then gently advanced over a 0.035 inch Roadrunner guidewire using biplane roadmap technique and constant fluoroscopic guidance and positioned just proximal to the origin of the superior thyroidal artery, and the lingual artery. At this time using biplane roadmap technique and constant fluoroscopic guidance, in a coaxial manner and with constant heparinized saline infusion, a rapid transit two tip microcatheter was advanced over a 0.014 inch Softip Synchro micro guidewire to the distal end of the 6 [place] guide catheter in the right external carotid artery. Using a torque device and biplane roadmap technique and constant fluoroscopic guidance, the micro guidewire was advanced into the right superior thyroidal artery,. After having established safe positioning of the tip of the microcatheter, and established lack of dangerous communications, embolization of this vessel was then performed using PVA particles of sizes 200-300 microns, and 350-500 microns mixed with 75% contrast and 25% heparinized saline infusion. Embolizations were  performed under intermittent fluoroscopy until there was stasis and/or reflux at the tip of the microcatheter. At the end of this, the microcatheter was retrieved more proximally. Control arteriogram performed through the 6 [place] guide catheter demonstrated complete stasis of contrast in the embolized vessel. Similarly, the right lingual artery  was then entered using biplane roadmap technique and constant fluoroscopic guidance. Again after having confirmed safe positioning of the tip of the microcatheter, embolization with the PVA particles of sizes 200-300, and 350-500 microns was then performed mixed in with 25% heparinized saline and 75% contrast. An embolization was again performed until there was stasis at the tip of the microcatheter or reflux. The microcatheter was then retrieved slightly proximally. A control arteriogram performed through the microcatheter demonstrated complete angiographic stasis within the embolized vessel. The microcatheter was then gently retrieved and removed. The 6 [place] guide catheter was then gently retrieved into the right common carotid artery. A control arteriogram performed intracranially demonstrated no evidence of vessel occlusions, or of intraluminal filling defects. Attention was now shifted to the left common carotid artery. After having advanced the 6 [place] Arrow sheath inside of which again was the 90 cm 6 [place] MDP curved Softip BriteTip guide catheter, arteriograms were performed of the left common carotid artery bifurcation and intracranially as described above. The 6 [place] guide catheter catheter was again advanced over a 0.035 inch Roadrunner guidewire using biplane roadmap technique and constant fluoroscopic guidance to just proximal to the origins of the left lingual artery and the left superior thyroidal artery. At this time, using biplane roadmap technique and constant fluoroscopic guidance, in a coaxial manner and with constant heparinized saline infusion,  over a 0.014 inch Synchro micro guidewire, access was obtained into the left superior thyroidal artery and left lingual artery as described above. Again after having confirmed safe positioning of the microcatheter tips in the respective vessels eliminating any dangerous communications, embolization was performed with PVA particles of sizes 200-300 microns, and 350-500 microns in each of these vessels until stasis was obtained. Control arteriograms performed after the embolization and in each of these vessels confirmed complete angiographic occlusion with stasis, the microcatheter was retrieved and completed. The 6 [place] BriteTip guide catheter was then retrieved into the common carotid artery on the left. Arteriograms performed centered intracranially continued to demonstrate no evidence of abnormal filling defects or vessel occlusions. The 6 [place] Arrow neurovascular sheath and the 6 [place] BriteTip guide catheter were then retrieved into the abdominal aorta and exchanged over a J-Tip guidewire for a 6 [place] Pinnacle sheath. This in itself was then replaced with an external closure device. Hemostasis was achieved at the skin entry site. Throughout the procedure, patient's neurologic status and hemodynamic status remained stable. The patient was then transported to the neuro ICU for further management. IMPRESSION: Endovascular superselective embolization of abnormally prominent superior thyroidal arteries bilaterally, and the lingual arteries using PVA particles of sizes 200-300 microns, and 350-500 microns with stasis in the embolized vessels as described above. Electronically Signed   By: Luanne Bras M.D.   On: 10/17/2015 16:27   Ir Angio External Carotid Sel Ext Carotid Bilat Mod Sed  10/18/2015  INDICATION: History of her pharyngeal mass carcinoma. Patient with uncontrollable bleeding from the tracheostomy site, and from the oropharyngeal mass. EXAM: BILATERAL COMMON CAROTID ARTERIOGRAMS AND  BILATERAL EXTERNAL CAROTID ARTERIOGRAMS FOLLOWED BY ENDOVASCULAR SUPERSELECTIVE EMBOLIZATION OF THE SUPERIOR THYROIDAL ARTERIES BILATERALLY, AND THE LINGUAL ARTERIES BILATERALLY WITH 200-300 MICRON PVA PARTICLES AND 350-500 MICRON PVA PARTICLES: MEDICATIONS: Ancef 2 g. The antibiotic was administered within 1 hour of the procedure. ANESTHESIA/SEDATION: General anesthesia provided by the Department of Anesthesiology [facility]. CONTRAST:  80 mL OMNIPAQUE IOHEXOL 300 MG/ML  SOLN FLUOROSCOPY TIME:  Fluoroscopy Time: 64.2 minutes seconds. 2409MG y). COMPLICATIONS: None immediate. PROCEDURE: Informed consent  was obtained from the patient following explanation of the procedure, risks, benefits and alternatives. The patient understands, agrees and consents for the procedure. All questions were addressed. A time out was performed prior to the initiation of the procedure. Maximal barrier sterile technique utilized including caps, mask, sterile gowns, sterile gloves, large sterile drape, hand hygiene, and Betadine prep. Following a full explanation of the procedure along with the potential associated complications, an informed witnessed consent was obtained from the patient and also her sister. Risks of an ischemic stroke of 1% was reviewed with them. After informed consent, patient was put under general anesthesia by the Department of Anesthesiology at [facility]. The right groin was prepped and draped in the usual sterile fashion. Thereafter using modified Seldinger technique, transfemoral access nto the right common femoral artery was obtained without difficulty. Over a 0.035 inch guidewire a 5 French Pinnacle sheath was inserted. Through this, and also over a 0.035 inch guidewire a 5 [place] JB 1 catheter was advanced to the aortic arch region and selectively positioned in the right common carotid artery, the right external carotid artery, the left common carotid artery and the left external carotid  artery. The patient tolerated the procedure well. FINDINGS: The right common carotid arteriogram demonstrates the right external carotid artery and its major branches to be widely patent. Abnormal prominence of the lingual artery and also the superior thyroidal artery leading inferiorly to a hypervascular mass was noted. The right internal carotid artery at the bulb demonstrated a focal shallow plaque without associated stenoses or ulcerations. The right internal carotid artery was seen to opacify normally to the cranial skull base. The petrous, cavernous and the supraclinoid segments are widely patent. A right posterior communicating artery is seen to opacify the right posterior cerebral artery distribution. The right middle and the right anterior cerebral arteries are seen to opacify normally into the capillary and venous phases. A few scattered focal areas of caliber irregularity involving the superior division of the right middle cerebral artery and the pericallosal artery on the right suggest intracranial arteriosclerosis. The left common carotid arteriogram demonstrates the left external carotid artery and its major branches to be widely patent. Again abnormal prominence of the superior thyroidal artery, and the lingual artery is noted. The delayed arterial phase demonstrates an exuberant hypervascular blush in the hypopharyngeal region. The left internal carotid artery at the bulb demonstrates a small ulcerative plaque associated with approximately 40% narrowing by the NASCET criteria. More distally the vessel is seen to opacify to the cranial skull base. The petrous, the cavernous and the supraclinoid segments are seen to opacify normally. Left posterior communicating artery is seen opacifying the left posterior cerebral artery distribution. The left middle and the left anterior cerebral artery are seen to opacify into the capillary and the venous phases. Again demonstrated are small focal areas of mild  caliber irregularity involving the left pericallosal and the left middle cerebral artery superior division. ENDOVASCULAR SUPERSELECTIVE EMBOLIZATION OF ABNORMALLY PROMINENT SUPERIOR THYROIDAL, AND THE LINGUAL ARTERIES BILATERALLY USING PVA PARTICLES: The 5 [place] JB 1 catheter in the right common carotid artery was exchanged for a 0.035 inch 300 cm Rosen exchange guidewire for a 6 French 65 cm Arrow neurovascular sheath using biplane roadmap technique and constant fluoroscopic guidance. Good aspiration was obtained from the hub of the neurovascular sheath. Gentle contrast injection demonstrated no evidence of spasms, dissections or of intraluminal filling defects. This was then connected to continuous heparinized saline infusion. Over the [employer],  a 90 cm 6 [place] MDP, Softip curved guide catheter was then advanced and positioned just proximal to the right common carotid artery bifurcation. The guidewire was removed. Good aspiration was obtained from the 6 [place] guide catheter. This was then gently advanced over a 0.035 inch Roadrunner guidewire using biplane roadmap technique and constant fluoroscopic guidance and positioned just proximal to the origin of the superior thyroidal artery, and the lingual artery. At this time using biplane roadmap technique and constant fluoroscopic guidance, in a coaxial manner and with constant heparinized saline infusion, a rapid transit two tip microcatheter was advanced over a 0.014 inch Softip Synchro micro guidewire to the distal end of the 6 [place] guide catheter in the right external carotid artery. Using a torque device and biplane roadmap technique and constant fluoroscopic guidance, the micro guidewire was advanced into the right superior thyroidal artery,. After having established safe positioning of the tip of the microcatheter, and established lack of dangerous communications, embolization of this vessel was then performed using PVA particles of sizes  200-300 microns, and 350-500 microns mixed with 75% contrast and 25% heparinized saline infusion. Embolizations were performed under intermittent fluoroscopy until there was stasis and/or reflux at the tip of the microcatheter. At the end of this, the microcatheter was retrieved more proximally. Control arteriogram performed through the 6 [place] guide catheter demonstrated complete stasis of contrast in the embolized vessel. Similarly, the right lingual artery was then entered using biplane roadmap technique and constant fluoroscopic guidance. Again after having confirmed safe positioning of the tip of the microcatheter, embolization with the PVA particles of sizes 200-300, and 350-500 microns was then performed mixed in with 25% heparinized saline and 75% contrast. An embolization was again performed until there was stasis at the tip of the microcatheter or reflux. The microcatheter was then retrieved slightly proximally. A control arteriogram performed through the microcatheter demonstrated complete angiographic stasis within the embolized vessel. The microcatheter was then gently retrieved and removed. The 6 [place] guide catheter was then gently retrieved into the right common carotid artery. A control arteriogram performed intracranially demonstrated no evidence of vessel occlusions, or of intraluminal filling defects. Attention was now shifted to the left common carotid artery. After having advanced the 6 [place] Arrow sheath inside of which again was the 90 cm 6 [place] MDP curved Softip BriteTip guide catheter, arteriograms were performed of the left common carotid artery bifurcation and intracranially as described above. The 6 [place] guide catheter catheter was again advanced over a 0.035 inch Roadrunner guidewire using biplane roadmap technique and constant fluoroscopic guidance to just proximal to the origins of the left lingual artery and the left superior thyroidal artery. At this time, using biplane  roadmap technique and constant fluoroscopic guidance, in a coaxial manner and with constant heparinized saline infusion, over a 0.014 inch Synchro micro guidewire, access was obtained into the left superior thyroidal artery and left lingual artery as described above. Again after having confirmed safe positioning of the microcatheter tips in the respective vessels eliminating any dangerous communications, embolization was performed with PVA particles of sizes 200-300 microns, and 350-500 microns in each of these vessels until stasis was obtained. Control arteriograms performed after the embolization and in each of these vessels confirmed complete angiographic occlusion with stasis, the microcatheter was retrieved and completed. The 6 French BriteTip guide catheter was then retrieved into the common carotid artery on the left. Arteriograms performed centered intracranially continued to demonstrate no evidence of abnormal filling defects or vessel occlusions.  The 6 [place] Arrow neurovascular sheath and the 6 [place] BriteTip guide catheter were then retrieved into the abdominal aorta and exchanged over a J-Tip guidewire for a 6 [place] Pinnacle sheath. This in itself was then replaced with an external closure device. Hemostasis was achieved at the skin entry site. Throughout the procedure, patient's neurologic status and hemodynamic status remained stable. The patient was then transported to the neuro ICU for further management. IMPRESSION: Endovascular superselective embolization of abnormally prominent superior thyroidal arteries bilaterally, and the lingual arteries using PVA particles of sizes 200-300 microns, and 350-500 microns with stasis in the embolized vessels as described above. Electronically Signed   By: Luanne Bras M.D.   On: 10/17/2015 16:27   Ir Neuro Each Add'l After Basic Uni Left (ms)  10/18/2015  INDICATION: History of her pharyngeal mass carcinoma. Patient with uncontrollable bleeding from  the tracheostomy site, and from the oropharyngeal mass. EXAM: BILATERAL COMMON CAROTID ARTERIOGRAMS AND BILATERAL EXTERNAL CAROTID ARTERIOGRAMS FOLLOWED BY ENDOVASCULAR SUPERSELECTIVE EMBOLIZATION OF THE SUPERIOR THYROIDAL ARTERIES BILATERALLY, AND THE LINGUAL ARTERIES BILATERALLY WITH 200-300 MICRON PVA PARTICLES AND 350-500 MICRON PVA PARTICLES: MEDICATIONS: Ancef 2 g. The antibiotic was administered within 1 hour of the procedure. ANESTHESIA/SEDATION: General anesthesia provided by the Department of Anesthesiology [facility]. CONTRAST:  80 mL OMNIPAQUE IOHEXOL 300 MG/ML  SOLN FLUOROSCOPY TIME:  Fluoroscopy Time: 64.2 minutes seconds. 2409MG y). COMPLICATIONS: None immediate. PROCEDURE: Informed consent was obtained from the patient following explanation of the procedure, risks, benefits and alternatives. The patient understands, agrees and consents for the procedure. All questions were addressed. A time out was performed prior to the initiation of the procedure. Maximal barrier sterile technique utilized including caps, mask, sterile gowns, sterile gloves, large sterile drape, hand hygiene, and Betadine prep. Following a full explanation of the procedure along with the potential associated complications, an informed witnessed consent was obtained from the patient and also her sister. Risks of an ischemic stroke of 1% was reviewed with them. After informed consent, patient was put under general anesthesia by the Department of Anesthesiology at [facility]. The right groin was prepped and draped in the usual sterile fashion. Thereafter using modified Seldinger technique, transfemoral access nto the right common femoral artery was obtained without difficulty. Over a 0.035 inch guidewire a 5 French Pinnacle sheath was inserted. Through this, and also over a 0.035 inch guidewire a 5 [place] JB 1 catheter was advanced to the aortic arch region and selectively positioned in the right common carotid  artery, the right external carotid artery, the left common carotid artery and the left external carotid artery. The patient tolerated the procedure well. FINDINGS: The right common carotid arteriogram demonstrates the right external carotid artery and its major branches to be widely patent. Abnormal prominence of the lingual artery and also the superior thyroidal artery leading inferiorly to a hypervascular mass was noted. The right internal carotid artery at the bulb demonstrated a focal shallow plaque without associated stenoses or ulcerations. The right internal carotid artery was seen to opacify normally to the cranial skull base. The petrous, cavernous and the supraclinoid segments are widely patent. A right posterior communicating artery is seen to opacify the right posterior cerebral artery distribution. The right middle and the right anterior cerebral arteries are seen to opacify normally into the capillary and venous phases. A few scattered focal areas of caliber irregularity involving the superior division of the right middle cerebral artery and the pericallosal artery on the right suggest intracranial arteriosclerosis. The  left common carotid arteriogram demonstrates the left external carotid artery and its major branches to be widely patent. Again abnormal prominence of the superior thyroidal artery, and the lingual artery is noted. The delayed arterial phase demonstrates an exuberant hypervascular blush in the hypopharyngeal region. The left internal carotid artery at the bulb demonstrates a small ulcerative plaque associated with approximately 40% narrowing by the NASCET criteria. More distally the vessel is seen to opacify to the cranial skull base. The petrous, the cavernous and the supraclinoid segments are seen to opacify normally. Left posterior communicating artery is seen opacifying the left posterior cerebral artery distribution. The left middle and the left anterior cerebral artery are seen to  opacify into the capillary and the venous phases. Again demonstrated are small focal areas of mild caliber irregularity involving the left pericallosal and the left middle cerebral artery superior division. ENDOVASCULAR SUPERSELECTIVE EMBOLIZATION OF ABNORMALLY PROMINENT SUPERIOR THYROIDAL, AND THE LINGUAL ARTERIES BILATERALLY USING PVA PARTICLES: The 5 [place] JB 1 catheter in the right common carotid artery was exchanged for a 0.035 inch 300 cm Rosen exchange guidewire for a 6 French 65 cm Arrow neurovascular sheath using biplane roadmap technique and constant fluoroscopic guidance. Good aspiration was obtained from the hub of the neurovascular sheath. Gentle contrast injection demonstrated no evidence of spasms, dissections or of intraluminal filling defects. This was then connected to continuous heparinized saline infusion. Over the [facility] exchange guidewire, a 90 cm 6 [place] MDP, Softip curved guide catheter was then advanced and positioned just proximal to the right common carotid artery bifurcation. The guidewire was removed. Good aspiration was obtained from the 6 [place] guide catheter. This was then gently advanced over a 0.035 inch Roadrunner guidewire using biplane roadmap technique and constant fluoroscopic guidance and positioned just proximal to the origin of the superior thyroidal artery, and the lingual artery. At this time using biplane roadmap technique and constant fluoroscopic guidance, in a coaxial manner and with constant heparinized saline infusion, a rapid transit two tip microcatheter was advanced over a 0.014 inch Softip Synchro micro guidewire to the distal end of the 6 [place] guide catheter in the right external carotid artery. Using a torque device and biplane roadmap technique and constant fluoroscopic guidance, the micro guidewire was advanced into the right superior thyroidal artery,. After having established safe positioning of the tip of the microcatheter, and established lack of  dangerous communications, embolization of this vessel was then performed using PVA particles of sizes 200-300 microns, and 350-500 microns mixed with 75% contrast and 25% heparinized saline infusion. Embolizations were performed under intermittent fluoroscopy until there was stasis and/or reflux at the tip of the microcatheter. At the end of this, the microcatheter was retrieved more proximally. Control arteriogram performed through the 6 [place] guide catheter demonstrated complete stasis of contrast in the embolized vessel. Similarly, the right lingual artery was then entered using biplane roadmap technique and constant fluoroscopic guidance. Again after having confirmed safe positioning of the tip of the microcatheter, embolization with the PVA particles of sizes 200-300, and 350-500 microns was then performed mixed in with 25% heparinized saline and 75% contrast. An embolization was again performed until there was stasis at the tip of the microcatheter or reflux. The microcatheter was then retrieved slightly proximally. A control arteriogram performed through the microcatheter demonstrated complete angiographic stasis within the embolized vessel. The microcatheter was then gently retrieved and removed. The 6 [place] guide catheter was then gently retrieved into the right common carotid artery. A control arteriogram  performed intracranially demonstrated no evidence of vessel occlusions, or of intraluminal filling defects. Attention was now shifted to the left common carotid artery. After having advanced the 6 [place] Arrow sheath inside of which again was the 90 cm 6 [place] MDP curved Softip BriteTip guide catheter, arteriograms were performed of the left common carotid artery bifurcation and intracranially as described above. The 6 [place] guide catheter catheter was again advanced over a 0.035 inch Roadrunner guidewire using biplane roadmap technique and constant fluoroscopic guidance to just proximal to the  origins of the left lingual artery and the left superior thyroidal artery. At this time, using biplane roadmap technique and constant fluoroscopic guidance, in a coaxial manner and with constant heparinized saline infusion, over a 0.014 inch Synchro micro guidewire, access was obtained into the left superior thyroidal artery and left lingual artery as described above. Again after having confirmed safe positioning of the microcatheter tips in the respective vessels eliminating any dangerous communications, embolization was performed with PVA particles of sizes 200-300 microns, and 350-500 microns in each of these vessels until stasis was obtained. Control arteriograms performed after the embolization and in each of these vessels confirmed complete angiographic occlusion with stasis, the microcatheter was retrieved and completed. The 6 [place] BriteTip guide catheter was then retrieved into the common carotid artery on the left. Arteriograms performed centered intracranially continued to demonstrate no evidence of abnormal filling defects or vessel occlusions. The 6 [place] Arrow neurovascular sheath and the 6 [place] BriteTip guide catheter were then retrieved into the abdominal aorta and exchanged over a J-Tip guidewire for a 6 [place] Pinnacle sheath. This in itself was then replaced with an external closure device. Hemostasis was achieved at the skin entry site. Throughout the procedure, patient's neurologic status and hemodynamic status remained stable. The patient was then transported to the neuro ICU for further management. IMPRESSION: Endovascular superselective embolization of abnormally prominent superior thyroidal arteries bilaterally, and the lingual arteries using PVA particles of sizes 200-300 microns, and 350-500 microns with stasis in the embolized vessels as described above. Electronically Signed   By: Luanne Bras M.D.   On: 10/17/2015 16:27   Ir Neuro Each Add'l After Basic Uni Right  (ms)  10/18/2015  INDICATION: History of her pharyngeal mass carcinoma. Patient with uncontrollable bleeding from the tracheostomy site, and from the oropharyngeal mass. EXAM: BILATERAL COMMON CAROTID ARTERIOGRAMS AND BILATERAL EXTERNAL CAROTID ARTERIOGRAMS FOLLOWED BY ENDOVASCULAR SUPERSELECTIVE EMBOLIZATION OF THE SUPERIOR THYROIDAL ARTERIES BILATERALLY, AND THE LINGUAL ARTERIES BILATERALLY WITH 200-300 MICRON PVA PARTICLES AND 350-500 MICRON PVA PARTICLES: MEDICATIONS: Ancef 2 g. The antibiotic was administered within 1 hour of the procedure. ANESTHESIA/SEDATION: General anesthesia provided by the Department of Anesthesiology [facility]. CONTRAST:  80 mL OMNIPAQUE IOHEXOL 300 MG/ML  SOLN FLUOROSCOPY TIME:  Fluoroscopy Time: 64.2 minutes seconds. 2409MG y). COMPLICATIONS: None immediate. PROCEDURE: Informed consent was obtained from the patient following explanation of the procedure, risks, benefits and alternatives. The patient understands, agrees and consents for the procedure. All questions were addressed. A time out was performed prior to the initiation of the procedure. Maximal barrier sterile technique utilized including caps, mask, sterile gowns, sterile gloves, large sterile drape, hand hygiene, and Betadine prep. Following a full explanation of the procedure along with the potential associated complications, an informed witnessed consent was obtained from the patient and also her sister. Risks of an ischemic stroke of 1% was reviewed with them. After informed consent, patient was put under general anesthesia by the Department of Anesthesiology at [facility]  [place] right groin was prepped and draped in the usual sterile fashion. Thereafter using modified Seldinger technique, transfemoral access nto the right common femoral artery was obtained without difficulty. Over a 0.035 inch guidewire a 5 French Pinnacle sheath was inserted. Through this, and also over a 0.035 inch guidewire a 5 [place]  JB 1 catheter was advanced to the aortic arch region and selectively positioned in the right common carotid artery, the right external carotid artery, the left common carotid artery and the left external carotid artery. The patient tolerated the procedure well. FINDINGS: The right common carotid arteriogram demonstrates the right external carotid artery and its major branches to be widely patent. Abnormal prominence of the lingual artery and also the superior thyroidal artery leading inferiorly to a hypervascular mass was noted. The right internal carotid artery at the bulb demonstrated a focal shallow plaque without associated stenoses or ulcerations. The right internal carotid artery was seen to opacify normally to the cranial skull base. The petrous, cavernous and the supraclinoid segments are widely patent. A right posterior communicating artery is seen to opacify the right posterior cerebral artery distribution. The right middle and the right anterior cerebral arteries are seen to opacify normally into the capillary and venous phases. A few scattered focal areas of caliber irregularity involving the superior division of the right middle cerebral artery and the pericallosal artery on the right suggest intracranial arteriosclerosis. The left common carotid arteriogram demonstrates the left external carotid artery and its major branches to be widely patent. Again abnormal prominence of the superior thyroidal artery, and the lingual artery is noted. The delayed arterial phase demonstrates an exuberant hypervascular blush in the hypopharyngeal region. The left internal carotid artery at the bulb demonstrates a small ulcerative plaque associated with approximately 40% narrowing by the NASCET criteria. More distally the vessel is seen to opacify to the cranial skull base. The petrous, the cavernous and the supraclinoid segments are seen to opacify normally. Left posterior communicating artery is seen opacifying the  left posterior cerebral artery distribution. The left middle and the left anterior cerebral artery are seen to opacify into the capillary and the venous phases. Again demonstrated are small focal areas of mild caliber irregularity involving the left pericallosal and the left middle cerebral artery superior division. ENDOVASCULAR SUPERSELECTIVE EMBOLIZATION OF ABNORMALLY PROMINENT SUPERIOR THYROIDAL, AND THE LINGUAL ARTERIES BILATERALLY USING PVA PARTICLES: The 5 [place] JB 1 catheter in the right common carotid artery was exchanged for a 0.035 inch 300 cm Rosen exchange guidewire for a 6 French 65 cm Arrow neurovascular sheath using biplane roadmap technique and constant fluoroscopic guidance. Good aspiration was obtained from the hub of the neurovascular sheath. Gentle contrast injection demonstrated no evidence of spasms, dissections or of intraluminal filling defects. This was then connected to continuous heparinized saline infusion. Over the [facility] exchange guidewire, a 90 cm 6 [place] MDP, Softip curved guide catheter was then advanced and positioned just proximal to the right common carotid artery bifurcation. The guidewire was removed. Good aspiration was obtained from the 6 [place] guide catheter. This was then gently advanced over a 0.035 inch Roadrunner guidewire using biplane roadmap technique and constant fluoroscopic guidance and positioned just proximal to the origin of the superior thyroidal artery, and the lingual artery. At this time using biplane roadmap technique and constant fluoroscopic guidance, in a coaxial manner and with constant heparinized saline infusion, a rapid transit two tip microcatheter was advanced over a 0.014 inch Softip Synchro micro guidewire to the  distal end of the 6 [place] guide catheter in the right external carotid artery. Using a torque device and biplane roadmap technique and constant fluoroscopic guidance, the micro guidewire was advanced into the right superior thyroidal  artery,. After having established safe positioning of the tip of the microcatheter, and established lack of dangerous communications, embolization of this vessel was then performed using PVA particles of sizes 200-300 microns, and 350-500 microns mixed with 75% contrast and 25% heparinized saline infusion. Embolizations were performed under intermittent fluoroscopy until there was stasis and/or reflux at the tip of the microcatheter. At the end of this, the microcatheter was retrieved more proximally. Control arteriogram performed through the 6 [place] guide catheter demonstrated complete stasis of contrast in the embolized vessel. Similarly, the right lingual artery was then entered using biplane roadmap technique and constant fluoroscopic guidance. Again after having confirmed safe positioning of the tip of the microcatheter, embolization with the PVA particles of sizes 200-300, and 350-500 microns was then performed mixed in with 25% heparinized saline and 75% contrast. An embolization was again performed until there was stasis at the tip of the microcatheter or reflux. The microcatheter was then retrieved slightly proximally. A control arteriogram performed through the microcatheter demonstrated complete angiographic stasis within the embolized vessel. The microcatheter was then gently retrieved and removed. The 6 [place] guide catheter was then gently retrieved into the right common carotid artery. A control arteriogram performed intracranially demonstrated no evidence of vessel occlusions, or of intraluminal filling defects. Attention was now shifted to the left common carotid artery. After having advanced the 6 [place] Arrow sheath inside of which again was the 90 cm 6 [place] MDP curved Softip BriteTip guide catheter, arteriograms were performed of the left common carotid artery bifurcation and intracranially as described above. The 6 [place] guide catheter catheter was again advanced over a 0.035 inch Roadrunner  guidewire using biplane roadmap technique and constant fluoroscopic guidance to just proximal to the origins of the left lingual artery and the left superior thyroidal artery. At this time, using biplane roadmap technique and constant fluoroscopic guidance, in a coaxial manner and with constant heparinized saline infusion, over a 0.014 inch Synchro micro guidewire, access was obtained into the left superior thyroidal artery and left lingual artery as described above. Again after having confirmed safe positioning of the microcatheter tips in the respective vessels eliminating any dangerous communications, embolization was performed with PVA particles of sizes 200-300 microns, and 350-500 microns in each of these vessels until stasis was obtained. Control arteriograms performed after the embolization and in each of these vessels confirmed complete angiographic occlusion with stasis, the microcatheter was retrieved and completed. The 6 French BriteTip guide catheter was then retrieved into the common carotid artery on the left. Arteriograms performed centered intracranially continued to demonstrate no evidence of abnormal filling defects or vessel occlusions. The 6 [place] Arrow neurovascular sheath and the 6 [place] BriteTip guide catheter were then retrieved into the abdominal aorta and exchanged over a J-Tip guidewire for a 6 [place] Pinnacle sheath. This in itself was then replaced with an external closure device. Hemostasis was achieved at the skin entry site. Throughout the procedure, patient's neurologic status and hemodynamic status remained stable. The patient was then transported to the neuro ICU for further management. IMPRESSION: Endovascular superselective embolization of abnormally prominent superior thyroidal arteries bilaterally, and the lingual arteries using PVA particles of sizes 200-300 microns, and 350-500 microns with stasis in the embolized vessels as described above. Electronically Signed  By:  Luanne Bras M.D.   On: 10/17/2015 16:27     CBC  Recent Labs Lab 10/16/15 0254 10/16/15 0743 10/16/15 1021 10/16/15 1658 10/17/15 0500 10/21/15 1627  WBC 15.6* 18.1* 24.9* 19.7* 18.6* 25.6*  HGB 7.8* 7.9* 8.2* 7.9* 7.9* 9.5*  HCT 26.1* 26.2* 25.9* 26.2* 26.1* 30.3*  PLT 177 188 198 183 188 321  MCV 88.5 87.9 87.2 87.3 87.3 87.6  MCH 26.4 26.5 27.6 26.3 26.4 27.5  MCHC 29.9* 30.2 31.7 30.2 30.3 31.4  RDW 16.1* 16.1* 16.0* 15.8* 16.0* 16.1*  LYMPHSABS 2.7  --   --   --  2.2 2.6  MONOABS 0.7  --   --   --  0.7 1.3*  EOSABS 0.3  --   --   --  0.3 0.0  BASOSABS 0.0  --   --   --  0.0 0.0    Chemistries   Recent Labs Lab 10/15/15 1745 10/16/15 0254 10/16/15 2100 10/17/15 0500 10/18/15 0735 10/21/15 1627 10/21/15 1630  NA 150* 146* 146* 144  --  144  --   K 3.4* 2.8* 3.4* 3.0* 3.1* 2.8*  --   CL 111 111 112* 110  --  108  --   CO2 26 23 21* 24  --  25  --   GLUCOSE 241* 299* 219* 262*  --  278*  --   BUN 25* 28* 15 15  --  23*  --   CREATININE 1.01* 1.04* 0.95 0.92  --  0.73  --   CALCIUM 9.5 8.6* 8.6* 8.3*  --  9.1  --   MG  --  1.9  --  1.7 1.9  --  1.7  AST  --   --   --  16  --   --   --   ALT  --   --   --  9*  --   --   --   ALKPHOS  --   --   --  130*  --   --   --   BILITOT  --   --   --  0.2*  --   --   --    ------------------------------------------------------------------------------------------------------------------ estimated creatinine clearance is 82.4 mL/min (by C-G formula based on Cr of 0.73). ------------------------------------------------------------------------------------------------------------------ No results for input(s): HGBA1C in the last 72 hours. ------------------------------------------------------------------------------------------------------------------ No results for input(s): CHOL, HDL, LDLCALC, TRIG, CHOLHDL, LDLDIRECT in the last 72  hours. ------------------------------------------------------------------------------------------------------------------ No results for input(s): TSH, T4TOTAL, T3FREE, THYROIDAB in the last 72 hours.  Invalid input(s): FREET3 ------------------------------------------------------------------------------------------------------------------ No results for input(s): VITAMINB12, FOLATE, FERRITIN, TIBC, IRON, RETICCTPCT in the last 72 hours.  Coagulation profile  Recent Labs Lab 10/16/15 0032  INR 1.26    No results for input(s): DDIMER in the last 72 hours.  Cardiac Enzymes No results for input(s): CKMB, TROPONINI, MYOGLOBIN in the last 168 hours.  Invalid input(s): CK ------------------------------------------------------------------------------------------------------------------ Invalid input(s): POCBNP   CBG:  Recent Labs Lab 10/17/15 1951 10/18/15 0008 10/18/15 0313 10/18/15 0741 10/18/15 1145  GLUCAP 213* 199* 182* 159* 119*       EKG: Independently reviewed. Sinus tachycardia with premature atrial complexes. Prolonged QTC of 487   Assessment/Plan Needs peripherally inserted central catheter: For continued IV antibiotics at home. - Admit to a telemetry bed at [facility] to place PICC at 9 AM Tomorrow  - NPO after midnight   Enterococcal Bacteremia and Fungemia  - Continue antibiotics of Ampicillin and fluconazole as previously prescribed  Hypokalemia:  Acute. Potassium noted to be significantly low at 2.8 on admission. EKG showing some questionable T-wave changes. She was given 60 mEq of potassium in the ED. - Repeat BMP in a.m. and replace as needed   Leukocytosis: Chronic. Appears to have been elevated during patient's entire hospital stay. 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 Last hemoglobin A1c 6.2 on 09/2015  - CBGs every before meals and at bedtime with sensitive sliding scale insulin - Hypoglycemic protocols   Anemia: Improved. Patient last hemoglobin was 7.9 on 3/6 prior to her discharge. On admission hemoglobin noted to be 9.5. - Continue to monitor  Jerrye Bushy - Continue Protonix  Code Status:   full Family Communication: bedside Disposition Plan: admit   Total time spent 55 minutes.Greater than 50% of this time was spent in counseling, explanation of diagnosis, planning of further management, and coordination of care  [facility] Pager [phone]  If 7PM-7AM, please contact night-coverage [email or web address] Password [facility] 10/21/2015, 8:17 PM

## 2015-10-22 ENCOUNTER — Observation Stay (HOSPITAL_COMMUNITY)
Admit: 2015-10-22 | Discharge: 2015-10-22 | Disposition: A | Discharge status: Home / Self Care | Payer: Medicaid Other | Attending: Emergency Medicine | Admitting: Emergency Medicine

## 2015-10-22 DIAGNOSIS — B49 Unspecified mycosis: Secondary | ICD-10-CM | POA: Diagnosis present

## 2015-10-22 DIAGNOSIS — E876 Hypokalemia: Secondary | ICD-10-CM | POA: Diagnosis not present

## 2015-10-22 DIAGNOSIS — R7881 Bacteremia: Secondary | ICD-10-CM

## 2015-10-22 DIAGNOSIS — B952 Enterococcus as the cause of diseases classified elsewhere: Secondary | ICD-10-CM | POA: Diagnosis present

## 2015-10-22 DIAGNOSIS — Z452 Encounter for adjustment and management of vascular access device: Secondary | ICD-10-CM

## 2015-10-22 DIAGNOSIS — C329 Malignant neoplasm of larynx, unspecified: Secondary | ICD-10-CM

## 2015-10-22 DIAGNOSIS — R000 Tachycardia, unspecified: Secondary | ICD-10-CM | POA: Diagnosis present

## 2015-10-22 LAB — BASIC METABOLIC PANEL
ANION GAP: 15 (ref 5–15)
Anion gap: 12 (ref 5–15)
BUN: 19 mg/dL (ref 6–20)
BUN: 18 mg/dL (ref 6–20)
CALCIUM: 8.8 mg/dL — AB (ref 8.9–10.3)
CALCIUM: 9 mg/dL (ref 8.9–10.3)
CO2: 24 mmol/L (ref 22–32)
CO2: 23 mmol/L (ref 22–32)
CREATININE: 0.88 mg/dL (ref 0.44–1.00)
CREATININE: 0.86 mg/dL (ref 0.44–1.00)
Chloride: 112 mmol/L — ABNORMAL HIGH (ref 101–111)
Chloride: 111 mmol/L (ref 101–111)
GFR calc Af Amer: >60 mL/min (ref >60)
GLUCOSE: 113 mg/dL — AB (ref 65–99)
GLUCOSE: 104 mg/dL — AB (ref 65–99)
Potassium: 2.6 mmol/L — CL (ref 3.5–5.1)
Potassium: 3.1 mmol/L — ABNORMAL LOW (ref 3.5–5.1)
Sodium: 148 mmol/L — ABNORMAL HIGH (ref 135–145)
Sodium: 149 mmol/L — ABNORMAL HIGH (ref 135–145)

## 2015-10-22 LAB — GLUCOSE, CAPILLARY
GLUCOSE-CAPILLARY: 122 mg/dL — AB (ref 65–99)
GLUCOSE-CAPILLARY: 111 mg/dL — AB (ref 65–99)
Glucose-Capillary: 101 mg/dL — ABNORMAL HIGH (ref 65–99)
Glucose-Capillary: 105 mg/dL — ABNORMAL HIGH (ref 65–99)

## 2015-10-22 MED ORDER — HEPARIN SOD (PORK) LOCK FLUSH 100 UNIT/ML IV SOLN
250.0000 [IU] | INTRAVENOUS | Status: AC | PRN
  Administered 2015-10-22: 500 [IU]

## 2015-10-22 MED ORDER — ACETAMINOPHEN 160 MG/5ML PO SOLN
650.0000 mg | Freq: Four times a day (QID) | ORAL | Status: DC | PRN
  Administered 2015-10-22 – 2015-10-23 (×2): 650 mg via ORAL
  Filled 2015-10-22 (×2): qty 20.3

## 2015-10-22 MED ORDER — LIDOCAINE HCL 1 % IJ SOLN
INTRAMUSCULAR | Status: AC
  Filled 2015-10-22: qty 20

## 2015-10-22 MED ORDER — POTASSIUM CHLORIDE 10 MEQ/100ML IV SOLN
10.0000 meq | INTRAVENOUS | Status: AC
  Administered 2015-10-22 (×5): 10 meq via INTRAVENOUS
  Filled 2015-10-22 (×4): qty 100

## 2015-10-22 MED ORDER — SODIUM CHLORIDE 0.9% FLUSH: 10.0000 mL | INTRAVENOUS | Status: DC | PRN

## 2015-10-22 MED ORDER — FAT EMULSION 20 % IV EMUL
240.0000 mL | INTRAVENOUS | Status: AC
  Filled 2015-10-22: qty 250

## 2015-10-22 MED ORDER — INSULIN ASPART 100 UNIT/ML ~~LOC~~ SOLN: 0.0000 [IU] | SUBCUTANEOUS | Status: DC

## 2015-10-22 MED ORDER — SODIUM CHLORIDE 0.45 % IV SOLN
INTRAVENOUS | Status: DC
  Administered 2015-10-22 (×2): via INTRAVENOUS

## 2015-10-22 MED ORDER — POTASSIUM CHLORIDE 10 MEQ/100ML IV SOLN
INTRAVENOUS | Status: AC
  Filled 2015-10-22: qty 100

## 2015-10-22 MED ORDER — POTASSIUM CHLORIDE 10 MEQ/100ML IV SOLN
10.0000 meq | INTRAVENOUS | Status: AC
  Administered 2015-10-22 – 2015-10-23 (×2): 10 meq via INTRAVENOUS
  Filled 2015-10-22 (×2): qty 100

## 2015-10-22 MED ORDER — PANTOPRAZOLE SODIUM 40 MG PO PACK
40.0000 mg | PACK | Freq: Every day | ORAL | Status: DC
  Filled 2015-10-22: qty 20

## 2015-10-22 MED ORDER — TRACE MINERALS CR-CU-MN-SE-ZN 10-1000-500-60 MCG/ML IV SOLN
INTRAVENOUS | Status: AC
  Filled 2015-10-22: qty 1992

## 2015-10-22 NOTE — Care Management Note (Signed)
Case Management Note  Patient Details  Name: YAR CLOWNEY MRN: [id number] Date of Birth: 24-Aug-1955  Subjective/Objective:                  PICC replacement Action/Plan: Discharge planning Expected Discharge Date:  10/22/15               Expected Discharge Plan:  Home with [facility] IV   In-House Referral:     Discharge planning Services     Post Acute Care Choice:    Choice offered to:     DME Arranged:    DME Agency:     HH Arranged:    [place] Agency:     Status of Service:     Medicare Important Message Given:    Date Medicare IM Given:    Medicare IM give by:    Date Additional Medicare IM Given:    Additional Medicare Important Message give by:     If discussed at [place] of Stay Meetings, dates discussed:    Additional Comments: CM received call from MD to ensure all is arranged to resume all [place] services and IV medication administration.  CM called [facility] rep, Tiffany to confirm NOTHING is needed by [facility] to resume services as pt is OBSERVATION.  CM notified [place] pt will be leaving the hospital this evening. Dellie Catholic, RN 10/22/2015, 2:57 PM

## 2015-10-22 NOTE — Progress Notes (Signed)
Patient received via stretcher via [facility] from [facility]. [place]. Orin. To room. R.N. Into do assess. Resp. There. has been notified of patient coming. Small amt of blood In trach.

## 2015-10-22 NOTE — Discharge Summary (Signed)
PATIENT DETAILS Name: Ashley Ramsey Age: 60 y.o. Sex: female Date of Birth: 12-29-1955 MRN: [id number]. Admitting Physician: Norval Morton, MD [id number], Jarold Song, MD  Admit Date: 10/21/2015 Discharge date: 10/23/2015  Recommendations for Outpatient Follow-up:  1. Resume home tNA as previous-please monitor labs/electrolytes per protocol. 2. Stop date of IV fluconazole and IV ampicillin 10/26/15.  3. Ensure follow-up with ENT, oncology, gastroenterology.   PRIMARY DISCHARGE DIAGNOSIS:  Principal Problem:   Needs peripherally inserted central catheter (PICC) Active Problems:   Type 2 diabetes mellitus without complication, without long-term current use of insulin (HCC)   Laryngeal cancer ([place])   Other complication due to venous access device (HCC)   Sinus tachycardia (HCC)   Hypokalemia   Enterococcal bacteremia   Fungemia      PAST MEDICAL HISTORY: Past Medical History  Diagnosis Date  . Thyroid disease   . Hypertension   . Neck mass hospitalized 08/29/2015  . Type II diabetes mellitus ([place])   . Left kidney mass   . Anemia, chronic disease     DISCHARGE MEDICATIONS: Current Discharge Medication List    CONTINUE these medications which have NOT CHANGED   Details  acetaminophen (TYLENOL) 325 MG suppository Place 1 suppository (325 mg total) rectally every 4 (four) hours as needed for mild pain. Qty: 30 suppository, Refills: 0    albuterol (PROVENTIL) (2.5 MG/3ML) 0.083% nebulizer solution Take 3 mLs (2.5 mg total) by nebulization every 6 (six) hours as needed for wheezing or shortness of breath. Qty: 75 mL, Refills: 12    ampicillin 2 g in sodium chloride 0.9 % 50 mL Inject 2 g into the vein every 4 (four) hours. Qty: 2 g, Refills: 0    fluconazole (DIFLUCAN) 400-0.9 MG/200ML-% IVPB Inject 200 mLs (400 mg total) into the vein daily. Until 3/15 Qty: 200 mL, Refills: 0    insulin aspart (NOVOLOG) 100 UNIT/ML injection Before each meal 3 times a day, 140-199 - 2  units, 200-250 - 4 units, 251-299 - 6 units,  300-349 - 8 units,  350 or above 10 units. Dispense syringes and needles as needed, Ok to switch to PEN if approved. Substitute to any brand approved. DX DM2, Code E11.65 Qty: 1 vial, Refills: 12    NONFORMULARY OR COMPOUNDED ITEM TPN with electrolytes and Fat emulsion per [place] by protocol via PICC Qty: 1 each, Refills: 0    pantoprazole (PROTONIX) 40 MG tablet Take 1 tablet (40 mg total) by mouth daily. Qty: 30 tablet, Refills: 1    Blood Glucose Monitoring Suppl (TRUE METRIX METER) DEVI 1 each by Does not apply route 3 (three) times daily before meals. Qty: 1 Device, Refills: 0   Associated Diagnoses: Type 2 diabetes mellitus without complication, without long-term current use of insulin ([place])    !! glucose blood (FREESTYLE LITE) test strip For glucose testing every before meals at bedtime. Diagnosis E 11.65  Can substitute to any accepted brand Qty: 100 each, Refills: 0    !! glucose blood (TRUE METRIX BLOOD GLUCOSE TEST) test strip Use 3 times daily before meals Qty: 100 each, Refills: 12   Associated Diagnoses: Type 2 diabetes mellitus without complication, without long-term current use of insulin (HCC)    TRUEPLUS LANCETS 28G MISC 1 each by Does not apply route 3 (three) times daily before meals. Qty: 100 each, Refills: 12   Associated Diagnoses: Type 2 diabetes mellitus without complication, without long-term current use of insulin ([place])     !! - Potential  duplicate medications found. Please discuss with provider.      ALLERGIES:  No Known Allergies  BRIEF HPI:  See H&P, Labs, Consult and Test reports for all details in brief, patient is a unfortunate 60 year old female with history of invasive laryngeal cancer-status post tracheostomy, severe dysphagia-nothing by mouth status, history of upper GI bleeding-on home TNA, on home IV antibiotics for enterococcal and fungemia -presented to the ED for dislodged PICC line.    CONSULTATIONS:   IR  PERTINENT RADIOLOGIC STUDIES: Dg Abd 1 View  10/07/2015  CLINICAL DATA:  60 year old female undergoing fluoroscopic guided feeding tube placement. Initial encounter. EXAM: ABDOMEN - 1 VIEW COMPARISON:  10/03/2015. FLUOROSCOPY TIME:  7 minutes 0 seconds FINDINGS: Single fluoroscopic image demonstrating feeding tube placement to the distal duodenum. Contrast injection opacifies the distal duodenum and proximal small bowel. 20 mL of water-soluble contrast Omnipaque 300 was utilized. IMPRESSION: Fluoroscopic guided feeding tube placement with tip at the distal duodenum. Electronically Signed   By: Genevie Ann M.D.   On: 10/07/2015 16:04   Dg Abd 1 View  10/03/2015  CLINICAL DATA:  Feeding tube placement. EXAM: ABDOMEN - 1 VIEW COMPARISON:  None. FINDINGS: A feeding tube is seen in place with the tip overlying the distal duodenum near the ligament of Treitz. This is confirmed by injection of contrast and feeding tube which is seen filling the proximal jejunum. IMPRESSION: Feeding tube tip overlying distal duodenum near the ligament of Treitz. Electronically Signed   By: Earle Gell M.D.   On: 10/03/2015 09:27   Ir Transcath/emboliz  10/18/2015  INDICATION: History of her pharyngeal mass carcinoma. Patient with uncontrollable bleeding from the tracheostomy site, and from the oropharyngeal mass. EXAM: BILATERAL COMMON CAROTID ARTERIOGRAMS AND BILATERAL EXTERNAL CAROTID ARTERIOGRAMS FOLLOWED BY ENDOVASCULAR SUPERSELECTIVE EMBOLIZATION OF THE SUPERIOR THYROIDAL ARTERIES BILATERALLY, AND THE LINGUAL ARTERIES BILATERALLY WITH 200-300 MICRON PVA PARTICLES AND 350-500 MICRON PVA PARTICLES: MEDICATIONS: Ancef 2 g. The antibiotic was administered within 1 hour of the procedure. ANESTHESIA/SEDATION: General anesthesia provided by the Department of Anesthesiology [facility] CONTRAST:  80 mL OMNIPAQUE IOHEXOL 300 MG/ML  SOLN FLUOROSCOPY TIME:  Fluoroscopy Time: 64.2 minutes seconds. 2409MG y).  COMPLICATIONS: None immediate. PROCEDURE: Informed consent was obtained from the patient following explanation of the procedure, risks, benefits and alternatives. The patient understands, agrees and consents for the procedure. All questions were addressed. A time out was performed prior to the initiation of the procedure. Maximal barrier sterile technique utilized including caps, mask, sterile gowns, sterile gloves, large sterile drape, hand hygiene, and Betadine prep. Following a full explanation of the procedure along with the potential associated complications, an informed witnessed consent was obtained from the patient and also her sister. Risks of an ischemic stroke of 1% was reviewed with them. After informed consent, patient was put under general anesthesia by the Department of Anesthesiology at [facility]. The right groin was prepped and draped in the usual sterile fashion. Thereafter using modified Seldinger technique, transfemoral access nto the right common femoral artery was obtained without difficulty. Over a 0.035 inch guidewire a 5 French Pinnacle sheath was inserted. Through this, and also over a 0.035 inch guidewire a 5 [place] JB 1 catheter was advanced to the aortic arch region and selectively positioned in the right common carotid artery, the right external carotid artery, the left common carotid artery and the left external carotid artery. The patient tolerated the procedure well. FINDINGS: The right common carotid arteriogram demonstrates the right external carotid artery and  its major branches to be widely patent. Abnormal prominence of the lingual artery and also the superior thyroidal artery leading inferiorly to a hypervascular mass was noted. The right internal carotid artery at the bulb demonstrated a focal shallow plaque without associated stenoses or ulcerations. The right internal carotid artery was seen to opacify normally to the cranial skull base. The petrous, cavernous and  the supraclinoid segments are widely patent. A right posterior communicating artery is seen to opacify the right posterior cerebral artery distribution. The right middle and the right anterior cerebral arteries are seen to opacify normally into the capillary and venous phases. A few scattered focal areas of caliber irregularity involving the superior division of the right middle cerebral artery and the pericallosal artery on the right suggest intracranial arteriosclerosis. The left common carotid arteriogram demonstrates the left external carotid artery and its major branches to be widely patent. Again abnormal prominence of the superior thyroidal artery, and the lingual artery is noted. The delayed arterial phase demonstrates an exuberant hypervascular blush in the hypopharyngeal region. The left internal carotid artery at the bulb demonstrates a small ulcerative plaque associated with approximately 40% narrowing by the NASCET criteria. More distally the vessel is seen to opacify to the cranial skull base. The petrous, the cavernous and the supraclinoid segments are seen to opacify normally. Left posterior communicating artery is seen opacifying the left posterior cerebral artery distribution. The left middle and the left anterior cerebral artery are seen to opacify into the capillary and the venous phases. Again demonstrated are small focal areas of mild caliber irregularity involving the left pericallosal and the left middle cerebral artery superior division. ENDOVASCULAR SUPERSELECTIVE EMBOLIZATION OF ABNORMALLY PROMINENT SUPERIOR THYROIDAL, AND THE LINGUAL ARTERIES BILATERALLY USING PVA PARTICLES: The 5 [place] JB 1 catheter in the right common carotid artery was exchanged for a 0.035 inch 300 cm Rosen exchange guidewire for a 6 French 65 cm Arrow neurovascular sheath using biplane roadmap technique and constant fluoroscopic guidance. Good aspiration was obtained from the hub of the neurovascular sheath. Gentle  contrast injection demonstrated no evidence of spasms, dissections or of intraluminal filling defects. This was then connected to continuous heparinized saline infusion. Over the [facility] exchange guidewire, a 90 cm 6 [place] MDP, Softip curved guide catheter was then advanced and positioned just proximal to the right common carotid artery bifurcation. The guidewire was removed. Good aspiration was obtained from the 6 [place] guide catheter. This was then gently advanced over a 0.035 inch Roadrunner guidewire using biplane roadmap technique and constant fluoroscopic guidance and positioned just proximal to the origin of the superior thyroidal artery, and the lingual artery. At this time using biplane roadmap technique and constant fluoroscopic guidance, in a coaxial manner and with constant heparinized saline infusion, a rapid transit two tip microcatheter was advanced over a 0.014 inch Softip Synchro micro guidewire to the distal end of the 6 [place] guide catheter in the right external carotid artery. Using a torque device and biplane roadmap technique and constant fluoroscopic guidance, the micro guidewire was advanced into the right superior thyroidal artery,. After having established safe positioning of the tip of the microcatheter, and established lack of dangerous communications, embolization of this vessel was then performed using PVA particles of sizes 200-300 microns, and 350-500 microns mixed with 75% contrast and 25% heparinized saline infusion. Embolizations were performed under intermittent fluoroscopy until there was stasis and/or reflux at the tip of the microcatheter. At the end of this, the microcatheter was retrieved more proximally.  Control arteriogram performed through the 6 [place] guide catheter demonstrated complete stasis of contrast in the embolized vessel. Similarly, the right lingual artery was then entered using biplane roadmap technique and constant fluoroscopic guidance. Again after having  confirmed safe positioning of the tip of the microcatheter, embolization with the PVA particles of sizes 200-300, and 350-500 microns was then performed mixed in with 25% heparinized saline and 75% contrast. An embolization was again performed until there was stasis at the tip of the microcatheter or reflux. The microcatheter was then retrieved slightly proximally. A control arteriogram performed through the microcatheter demonstrated complete angiographic stasis within the embolized vessel. The microcatheter was then gently retrieved and removed. The 6 [place] guide catheter was then gently retrieved into the right common carotid artery. A control arteriogram performed intracranially demonstrated no evidence of vessel occlusions, or of intraluminal filling defects. Attention was now shifted to the left common carotid artery. After having advanced the 6 [place] Arrow sheath inside of which again was the 90 cm 6 [place] MDP curved Softip BriteTip guide catheter, arteriograms were performed of the left common carotid artery bifurcation and intracranially as described above. The 6 [place] guide catheter catheter was again advanced over a 0.035 inch Roadrunner guidewire using biplane roadmap technique and constant fluoroscopic guidance to just proximal to the origins of the left lingual artery and the left superior thyroidal artery. At this time, using biplane roadmap technique and constant fluoroscopic guidance, in a coaxial manner and with constant heparinized saline infusion, over a 0.014 inch Synchro micro guidewire, access was obtained into the left superior thyroidal artery and left lingual artery as described above. Again after having confirmed safe positioning of the microcatheter tips in the respective vessels eliminating any dangerous communications, embolization was performed with PVA particles of sizes 200-300 microns, and 350-500 microns in each of these vessels until stasis was obtained. Control arteriograms  performed after the embolization and in each of these vessels confirmed complete angiographic occlusion with stasis, the microcatheter was retrieved and completed. The 6 French BriteTip guide catheter was then retrieved into the common carotid artery on the left. Arteriograms performed centered intracranially continued to demonstrate no evidence of abnormal filling defects or vessel occlusions. The 6 [place] Arrow neurovascular sheath and the 6 [place] BriteTip guide catheter were then retrieved into the abdominal aorta and exchanged over a J-Tip guidewire for a 6 [place] Pinnacle sheath. This in itself was then replaced with an external closure device. Hemostasis was achieved at the skin entry site. Throughout the procedure, patient's neurologic status and hemodynamic status remained stable. The patient was then transported to the neuro ICU for further management. IMPRESSION: Endovascular superselective embolization of abnormally prominent superior thyroidal arteries bilaterally, and the lingual arteries using PVA particles of sizes 200-300 microns, and 350-500 microns with stasis in the embolized vessels as described above. Electronically Signed   By: Luanne Bras M.D.   On: 10/17/2015 16:27   Ir Angiogram Follow Up Study  10/18/2015  INDICATION: History of her pharyngeal mass carcinoma. Patient with uncontrollable bleeding from the tracheostomy site, and from the oropharyngeal mass. EXAM: BILATERAL COMMON CAROTID ARTERIOGRAMS AND BILATERAL EXTERNAL CAROTID ARTERIOGRAMS FOLLOWED BY ENDOVASCULAR SUPERSELECTIVE EMBOLIZATION OF THE SUPERIOR THYROIDAL ARTERIES BILATERALLY, AND THE LINGUAL ARTERIES BILATERALLY WITH 200-300 MICRON PVA PARTICLES AND 350-500 MICRON PVA PARTICLES: MEDICATIONS: Ancef 2 g. The antibiotic was administered within 1 hour of the procedure. ANESTHESIA/SEDATION: General anesthesia provided by the Department of Anesthesiology [facility]. CONTRAST:  80 mL OMNIPAQUE IOHEXOL 300  MG/ML   SOLN FLUOROSCOPY TIME:  Fluoroscopy Time: 64.2 minutes seconds. 2409MG y). COMPLICATIONS: None immediate. PROCEDURE: Informed consent was obtained from the patient following explanation of the procedure, risks, benefits and alternatives. The patient understands, agrees and consents for the procedure. All questions were addressed. A time out was performed prior to the initiation of the procedure. Maximal barrier sterile technique utilized including caps, mask, sterile gowns, sterile gloves, large sterile drape, hand hygiene, and Betadine prep. Following a full explanation of the procedure along with the potential associated complications, an informed witnessed consent was obtained from the patient and also her sister. Risks of an ischemic stroke of 1% was reviewed with them. After informed consent, patient was put under general anesthesia by the Department of Anesthesiology at [facility]. The right groin was prepped and draped in the usual sterile fashion. Thereafter using modified Seldinger technique, transfemoral access nto the right common femoral artery was obtained without difficulty. Over a 0.035 inch guidewire a 5 French Pinnacle sheath was inserted. Through this, and also over a 0.035 inch guidewire a 5 [place] JB 1 catheter was advanced to the aortic arch region and selectively positioned in the right common carotid artery, the right external carotid artery, the left common carotid artery and the left external carotid artery. The patient tolerated the procedure well. FINDINGS: The right common carotid arteriogram demonstrates the right external carotid artery and its major branches to be widely patent. Abnormal prominence of the lingual artery and also the superior thyroidal artery leading inferiorly to a hypervascular mass was noted. The right internal carotid artery at the bulb demonstrated a focal shallow plaque without associated stenoses or ulcerations. The right internal carotid artery was seen  to opacify normally to the cranial skull base. The petrous, cavernous and the supraclinoid segments are widely patent. A right posterior communicating artery is seen to opacify the right posterior cerebral artery distribution. The right middle and the right anterior cerebral arteries are seen to opacify normally into the capillary and venous phases. A few scattered focal areas of caliber irregularity involving the superior division of the right middle cerebral artery and the pericallosal artery on the right suggest intracranial arteriosclerosis. The left common carotid arteriogram demonstrates the left external carotid artery and its major branches to be widely patent. Again abnormal prominence of the superior thyroidal artery, and the lingual artery is noted. The delayed arterial phase demonstrates an exuberant hypervascular blush in the hypopharyngeal region. The left internal carotid artery at the bulb demonstrates a small ulcerative plaque associated with approximately 40% narrowing by the NASCET criteria. More distally the vessel is seen to opacify to the cranial skull base. The petrous, the cavernous and the supraclinoid segments are seen to opacify normally. Left posterior communicating artery is seen opacifying the left posterior cerebral artery distribution. The left middle and the left anterior cerebral artery are seen to opacify into the capillary and the venous phases. Again demonstrated are small focal areas of mild caliber irregularity involving the left pericallosal and the left middle cerebral artery superior division. ENDOVASCULAR SUPERSELECTIVE EMBOLIZATION OF ABNORMALLY PROMINENT SUPERIOR THYROIDAL, AND THE LINGUAL ARTERIES BILATERALLY USING PVA PARTICLES: The 5 [place] JB 1 catheter in the right common carotid artery was exchanged for a 0.035 inch 300 cm Rosen exchange guidewire for a 6 French 65 cm Arrow neurovascular sheath using biplane roadmap technique and constant fluoroscopic guidance. Good  aspiration was obtained from the hub of the neurovascular sheath. Gentle contrast injection demonstrated no evidence of spasms, dissections  or of intraluminal filling defects. This was then connected to continuous heparinized saline infusion. Over the [facility] exchange guidewire, a 90 cm 6 [place] MDP, Softip curved guide catheter was then advanced and positioned just proximal to the right common carotid artery bifurcation. The guidewire was removed. Good aspiration was obtained from the 6 [place] guide catheter. This was then gently advanced over a 0.035 inch Roadrunner guidewire using biplane roadmap technique and constant fluoroscopic guidance and positioned just proximal to the origin of the superior thyroidal artery, and the lingual artery. At this time using biplane roadmap technique and constant fluoroscopic guidance, in a coaxial manner and with constant heparinized saline infusion, a rapid transit two tip microcatheter was advanced over a 0.014 inch Softip Synchro micro guidewire to the distal end of the 6 [place] guide catheter in the right external carotid artery. Using a torque device and biplane roadmap technique and constant fluoroscopic guidance, the micro guidewire was advanced into the right superior thyroidal artery,. After having established safe positioning of the tip of the microcatheter, and established lack of dangerous communications, embolization of this vessel was then performed using PVA particles of sizes 200-300 microns, and 350-500 microns mixed with 75% contrast and 25% heparinized saline infusion. Embolizations were performed under intermittent fluoroscopy until there was stasis and/or reflux at the tip of the microcatheter. At the end of this, the microcatheter was retrieved more proximally. Control arteriogram performed through the 6 [place] guide catheter demonstrated complete stasis of contrast in the embolized vessel. Similarly, the right lingual artery was then entered using biplane  roadmap technique and constant fluoroscopic guidance. Again after having confirmed safe positioning of the tip of the microcatheter, embolization with the PVA particles of sizes 200-300, and 350-500 microns was then performed mixed in with 25% heparinized saline and 75% contrast. An embolization was again performed until there was stasis at the tip of the microcatheter or reflux. The microcatheter was then retrieved slightly proximally. A control arteriogram performed through the microcatheter demonstrated complete angiographic stasis within the embolized vessel. The microcatheter was then gently retrieved and removed. The 6 [place] guide catheter was then gently retrieved into the right common carotid artery. A control arteriogram performed intracranially demonstrated no evidence of vessel occlusions, or of intraluminal filling defects. Attention was now shifted to the left common carotid artery. After having advanced the 6 [place] Arrow sheath inside of which again was the 90 cm 6 [place] MDP curved Softip BriteTip guide catheter, arteriograms were performed of the left common carotid artery bifurcation and intracranially as described above. The 6 [place] guide catheter catheter was again advanced over a 0.035 inch Roadrunner guidewire using biplane roadmap technique and constant fluoroscopic guidance to just proximal to the origins of the left lingual artery and the left superior thyroidal artery. At this time, using biplane roadmap technique and constant fluoroscopic guidance, in a coaxial manner and with constant heparinized saline infusion, over a 0.014 inch Synchro micro guidewire, access was obtained into the left superior thyroidal artery and left lingual artery as described above. Again after having confirmed safe positioning of the microcatheter tips in the respective vessels eliminating any dangerous communications, embolization was performed with PVA particles of sizes 200-300 microns, and 350-500 microns  in each of these vessels until stasis was obtained. Control arteriograms performed after the embolization and in each of these vessels confirmed complete angiographic occlusion with stasis, the microcatheter was retrieved and completed. The 6 [place] BriteTip guide catheter was then retrieved into the common carotid artery  on the left. Arteriograms performed centered intracranially continued to demonstrate no evidence of abnormal filling defects or vessel occlusions. The 6 [place] Arrow neurovascular sheath and the 6 [place] BriteTip guide catheter were then retrieved into the abdominal aorta and exchanged over a J-Tip guidewire for a 6 [place] Pinnacle sheath. This in itself was then replaced with an external closure device. Hemostasis was achieved at the skin entry site. Throughout the procedure, patient's neurologic status and hemodynamic status remained stable. The patient was then transported to the neuro ICU for further management. IMPRESSION: Endovascular superselective embolization of abnormally prominent superior thyroidal arteries bilaterally, and the lingual arteries using PVA particles of sizes 200-300 microns, and 350-500 microns with stasis in the embolized vessels as described above. Electronically Signed   By: Luanne Bras M.D.   On: 10/17/2015 16:27   Ir US Guide Vasc Access Right  10/22/2015  INDICATION: 60 year old female requiring outpatient IV antibiotics and anti fungal medications. Her PICC was recently displaced. She requires placement of a new peripheral line. EXAM: PICC LINE PLACEMENT WITH ULTRASOUND AND FLUOROSCOPIC GUIDANCE MEDICATIONS: None required ANESTHESIA/SEDATION: None required FLUOROSCOPY TIME:  Fluoroscopy Time: 0 minutes 36 seconds (2 mGy). COMPLICATIONS: None immediate. PROCEDURE: The patient was advised of the possible risks and complications and agreed to undergo the procedure. The patient was then brought to the angiographic suite for the procedure. The right arm was  prepped with chlorhexidine, draped in the usual sterile fashion using maximum barrier technique (cap and mask, sterile gown, sterile gloves, large sterile sheet, hand hygiene and cutaneous antisepsis) and infiltrated locally with 1% Lidocaine. Ultrasound demonstrated patency of the right brachial vein, and this was documented with an image. Under real-time ultrasound guidance, this vein was accessed with a 21 gauge micropuncture needle and image documentation was performed. A 0.018 wire was introduced in to the vein. Over this, a 5 [place] single lumen power injectable PICC was advanced to the lower SVC/right atrial junction. Fluoroscopy during the procedure and fluoro spot radiograph confirms appropriate catheter position. The catheter was flushed and covered with a sterile dressing. Catheter length: 39 cm IMPRESSION: Successful right arm power PICC line placement with ultrasound and fluoroscopic guidance. The catheter is ready for use. Signed, Criselda Peaches, MD [facility] Radiology Electronically Signed   By: Jacqulynn Cadet M.D.   On: 10/22/2015 14:02   Portable Chest Xray  10/17/2015  CLINICAL DATA:  10/16/2015. EXAM: PORTABLE CHEST 1 VIEW COMPARISON:  None. FINDINGS: Tracheostomy tube in stable position. Right PICC line tip remains in the right atrium. Low lung volumes with basilar atelectasis. No focal pulmonary infiltrate. No pleural effusion or pneumothorax. Heart size normal. No acute osseous abnormality. IMPRESSION: 1. Tracheostomy tube in stable position. Right PICC line tip remains in the right atrium. Again retraction of approximately 4-5 cm should be considered. 2. Lung volumes with mild bibasilar atelectasis. Electronically Signed   By: Marcello Moores  Register   On: 10/17/2015 07:11   Dg Chest Port 1 View  10/16/2015  CLINICAL DATA:  Evaluate PICC line placement EXAM: PORTABLE CHEST 1 VIEW COMPARISON:  10/04/2015 FINDINGS: There is a right PICC  line identified. The tip extends about 5 cm beyond the cavoatrial junction into the right atrium. There is no pneumothorax. Tracheostomy tube again identified. Mild atelectatic change at both lung bases with low lung volumes. IMPRESSION: Right PICC line as described with tip well into the right atrium. Suggest retraction by about 4-5 cm and repeat radiograph. Electronically Signed  By: Skipper Cliche M.D.   On: 10/16/2015 13:39   Dg Chest Port 1 View  10/04/2015  CLINICAL DATA:  Evaluate central line placement EXAM: PORTABLE CHEST 1 VIEW COMPARISON:  09/25/2015 FINDINGS: Right upper extremity PICC. Tip placement is indeterminate due to underpenetration and artifact from EKG leads. The tip is the level of the right atrium, depth uncertain. Would retracted by 3-4 cm. New feeding tube with tip at least reaching the stomach. Tracheostomy tube is well seated. Improved inflation compared to prior. No pneumonia or edema. IMPRESSION: Right upper extremity PICC with tip at the right atrium, depth uncertain due to technical factors. Suggest retraction by 3-4 cm and repeat radiograph. Electronically Signed   By: Monte Fantasia M.D.   On: 10/04/2015 14:47   Dg Chest Port 1 View  09/25/2015  CLINICAL DATA:  60 year old with recent diagnosis of laryngeal cancer with indwelling tracheostomy, presenting 2 days ago with sepsis. Worsening hypoxia acutely today. EXAM: PORTABLE CHEST 1 VIEW COMPARISON:  09/23/2015 dating back to 08/29/2015. FINDINGS: Tracheostomy tube in satisfactory position below the thoracic inlet. Markedly suboptimal inspiration with atelectasis in the lung bases. Cardiac silhouette mildly enlarged even allowing for technique and degree of inspiration. Pulmonary venous hypertension and minimal to mild interstitial pulmonary edema, new since 2 days ago. Streaky and patchy opacity at the left lung base. No confluent consolidation elsewhere. Note is made of slight inferior subluxation of the left humeral head  relative to the glenoid, not seen on prior examinations, though moderate to severe degenerative changes are present in the left glenohumeral joint. IMPRESSION: 1. Markedly suboptimal inspiration with bibasilar atelectasis. Possible developing bronchopneumonia at the left lung base. 2. Stable cardiomegaly. Pulmonary venous hypertension with minimal to mild interstitial pulmonary edema, query fluid overload. 3. Slight inferior subluxation of the left glenohumeral joint, associated with moderate to severe degenerative changes. Electronically Signed   By: Evangeline Dakin M.D.   On: 09/25/2015 18:12   Dg Chest Port 1 View  09/23/2015  CLINICAL DATA:  60 year old female with shortness of breath and cough EXAM: PORTABLE CHEST 1 VIEW COMPARISON:  Radiograph dated 09/14/2015 FINDINGS: Right-sided PICC in stable positioning. Tracheostomy with tip above the carina in stable positioning. Single-view of the chest demonstrate a focal area of hazy density at the left lung base, likely atelectatic changes. Pneumonia is less likely but not excluded. Clinical correlation is recommended. The lungs are hypovolemic. No pleural effusion or pneumothorax. Stable cardiac silhouette. No acute osseous pathology. IMPRESSION: Minimal left lung base atelectatic changes versus less likely pneumonia. Clinical correlation recommended. Electronically Signed   By: Anner Crete M.D.   On: 09/23/2015 18:30   Dg Naso G Tube Plc W/fl-no Rad  10/07/2015  CLINICAL DATA:  NASO G TUBE PLACEMENT WITH FLUORO Fluoroscopy was utilized by the requesting physician.  No radiographic interpretation.   Dg Naso G Tube Plc W/fl-no Rad  10/03/2015  CLINICAL DATA:  NASO G TUBE PLACEMENT WITH FLUORO Fluoroscopy was utilized by the requesting physician.  No radiographic interpretation.   Dg Swallowing Func-speech Pathology  10/01/2015  Objective Swallowing Evaluation: Type of Study: MBS-Modified Barium Swallow Study Patient Details Name: TANIYAH POLICARPIO MRN:  [id number] Date of Birth: 1956-02-20 Today's Date: 10/01/2015 Time: SLP Start Time (ACUTE ONLY): 1021-SLP Stop Time (ACUTE ONLY): 1050 SLP Time Calculation (min) (ACUTE ONLY): 29 min Past Medical History: Past Medical History Diagnosis Date . Thyroid disease  . Hypertension  . Neck mass hospitalized 08/29/2015 . Type II diabetes mellitus ([place])  . Left  kidney mass  . Anemia, chronic disease  Past Surgical History: Past Surgical History Procedure Laterality Date . No past surgeries   . Esophagogastroduodenoscopy N/A 09/04/2015   Procedure: ESOPHAGOGASTRODUODENOSCOPY (EGD);  Surgeon: Manus Gunning, MD;  Location: [place];  Service: Gastroenterology;  Laterality: N/A; . Direct laryngoscopy N/A 09/02/2015   Procedure: DIRECT LARYNGOSCOPY WITH BIOPSY;  Surgeon: Izora Gala, MD;  Location: [place];  Service: ENT;  Laterality: N/A; . Esophagoscopy N/A 09/02/2015   Procedure: ESOPHAGOSCOPY;  Surgeon: Izora Gala, MD;  Location: [place];  Service: ENT;  Laterality: N/A; . Tracheostomy tube placement N/A 09/04/2015   Procedure: TRACHEOSTOMY;  Surgeon: Izora Gala, MD;  Location: [place];  Service: ENT;  Laterality: N/A; . Direct laryngoscopy  09/04/2015   Procedure: DIRECT View LARYNGOSCOPY with cautery of of tumor;  Surgeon: Izora Gala, MD;  Location: [place];  Service: ENT;; HPI: 60 y.o. female with h/o laryngeal cancer, s/p trach, type 2 DM, who presented to ED due to confusion. Pt problem list includes: severe sepsis possibly due to LLL HCAP, infection in PICC line. Last seen by SLP 1/31 for PMSV tx. Most recent MBSS 1/26 recommended NPO status except for free water protocol under strict guidelines. Pt remained NPO at d/c last month, with nutrition via TPN. Pt reported PMSV use at home as well as PO intake. Subjective: pt alert, eager for food/drink Assessment / Plan / Recommendation CHL IP CLINICAL IMPRESSIONS 10/01/2015 Therapy Diagnosis Moderate pharyngeal phase dysphagia;Severe pharyngeal phase dysphagia Clinical  Impression MBS complete. Results generally consistent with most recent MBS results 09/08/15 with potentially slightly greater dysfunction. She continues to exhibit a moderate-severe anatomically based pharyngeal dysphagia resulting in sensory and motor deficits. Primary deficit appears to be decreased laryngeal closure, likely related to laryngeal mass,  and pharyngeal residuals resulting in poor airway protection both during and post swallow with all consistencies assessed. Cough present but weak and unsuccessful to clear the airway further increasing risk of an aspiration related infection. Rehabilitation of swallow function prior to intervention for tumor guarded due to current size/location. Long term non-oral means of nutrition appropriate at this time. Recommend NPO except for free water protocol under strict guidelines. Pending plan for intervention for tumor, would recommend continued SLP services for maintenance of musculature and potential for a po diet in the future.  Impact on safety and function Severe aspiration risk;Risk for inadequate nutrition/hydration   CHL IP TREATMENT RECOMMENDATION 09/08/2015 Treatment Recommendations Therapy as outlined in treatment plan below   Prognosis 10/01/2015 Prognosis for Safe Diet Advancement Guarded Barriers to Reach Goals Severity of deficits Barriers/Prognosis Comment -- CHL IP DIET RECOMMENDATION 10/01/2015 SLP Diet Recommendations NPO;Ice chips PRN after oral care;Free water protocol after oral care;Alternative means - long-term Liquid Administration via -- Medication Administration Via alternative means Compensations -- Postural Changes --   CHL IP OTHER RECOMMENDATIONS 10/01/2015 Recommended Consults -- Oral Care Recommendations Oral care QID Other Recommendations --   CHL IP FOLLOW UP RECOMMENDATIONS 10/01/2015 Follow up Recommendations Outpatient SLP   CHL IP FREQUENCY AND DURATION 10/01/2015 Speech Therapy Frequency (ACUTE ONLY) min 2x/week Treatment Duration 2  weeks      CHL IP ORAL PHASE 10/01/2015 Oral Phase WFL Oral - Pudding Teaspoon -- Oral - Pudding Cup -- Oral - Honey Teaspoon -- Oral - Honey Cup -- Oral - Nectar Teaspoon -- Oral - Nectar Cup -- Oral - Nectar Straw -- Oral - Thin Teaspoon -- Oral - Thin Cup -- Oral - Thin Straw -- Oral - Puree --  Oral - Mech Soft -- Oral - Regular -- Oral - Multi-Consistency -- Oral - Pill -- Oral Phase - Comment --  CHL IP PHARYNGEAL PHASE 10/01/2015 Pharyngeal Phase Impaired Pharyngeal- Pudding Teaspoon NT Pharyngeal -- Pharyngeal- Pudding Cup -- Pharyngeal -- Pharyngeal- Honey Teaspoon NT Pharyngeal -- Pharyngeal- Honey Cup NT Pharyngeal -- Pharyngeal- Nectar Teaspoon Reduced airway/laryngeal closure;Reduced epiglottic inversion;Penetration/Aspiration during swallow;Penetration/Apiration after swallow;Pharyngeal residue - valleculae;Pharyngeal residue - pyriform Pharyngeal Material enters airway, passes BELOW cords and not ejected out despite cough attempt by patient Pharyngeal- Nectar Cup -- Pharyngeal -- Pharyngeal- Nectar Straw -- Pharyngeal -- Pharyngeal- Thin Teaspoon Reduced airway/laryngeal closure;Reduced epiglottic inversion;Penetration/Aspiration during swallow;Penetration/Apiration after swallow;Pharyngeal residue - valleculae;Pharyngeal residue - pyriform Pharyngeal Material enters airway, passes BELOW cords and not ejected out despite cough attempt by patient Pharyngeal- Thin Cup -- Pharyngeal -- Pharyngeal- Thin Straw NT Pharyngeal -- Pharyngeal- Puree Reduced airway/laryngeal closure;Reduced epiglottic inversion;Penetration/Apiration after swallow;Pharyngeal residue - valleculae;Pharyngeal residue - pyriform Pharyngeal -- Pharyngeal- Mechanical Soft -- Pharyngeal -- Pharyngeal- Regular -- Pharyngeal -- Pharyngeal- Multi-consistency -- Pharyngeal -- Pharyngeal- Pill -- Pharyngeal -- Pharyngeal Comment --  CHL IP CERVICAL ESOPHAGEAL PHASE 10/01/2015 Cervical Esophageal Phase (No Data) Pudding Teaspoon -- Pudding Cup --  Honey Teaspoon -- Honey Cup -- Nectar Teaspoon -- Nectar Cup -- Nectar Straw -- Thin Teaspoon -- Thin Cup -- Thin Straw -- Puree -- Mechanical Soft -- Regular -- Multi-consistency -- Pill -- Cervical Esophageal Comment -- No flowsheet data found. Gabriel Rainwater MA, CCC-SLP [phone] [place] Leah Meryl 10/01/2015, 11:44 AM              Ir Cyndy Freeze Guide Cv Midline Picc Right  10/22/2015  INDICATION: 60 year old female requiring outpatient IV antibiotics and anti fungal medications. Her PICC was recently displaced. She requires placement of a new peripheral line. EXAM: PICC LINE PLACEMENT WITH ULTRASOUND AND FLUOROSCOPIC GUIDANCE MEDICATIONS: None required ANESTHESIA/SEDATION: None required FLUOROSCOPY TIME:  Fluoroscopy Time: 0 minutes 36 seconds (2 mGy). COMPLICATIONS: None immediate. PROCEDURE: The patient was advised of the possible risks and complications and agreed to undergo the procedure. The patient was then brought to the angiographic suite for the procedure. The right arm was prepped with chlorhexidine, draped in the usual sterile fashion using maximum barrier technique (cap and mask, sterile gown, sterile gloves, large sterile sheet, hand hygiene and cutaneous antisepsis) and infiltrated locally with 1% Lidocaine. Ultrasound demonstrated patency of the right brachial vein, and this was documented with an image. Under real-time ultrasound guidance, this vein was accessed with a 21 gauge micropuncture needle and image documentation was performed. A 0.018 wire was introduced in to the vein. Over this, a 5 [place] single lumen power injectable PICC was advanced to the lower SVC/right atrial junction. Fluoroscopy during the procedure and fluoro spot radiograph confirms appropriate catheter position. The catheter was flushed and covered with a sterile dressing. Catheter length: 39 cm IMPRESSION: Successful right arm power PICC line placement with ultrasound and fluoroscopic guidance. The catheter is ready for use.  Signed, Criselda Peaches, MD [facility] Radiology Electronically Signed   By: Jacqulynn Cadet M.D.   On: 10/22/2015 14:02   Ir Angio Intra Extracran Sel Com Carotid Innominate Bilat Mod Sed  10/18/2015  INDICATION: History of her pharyngeal mass carcinoma. Patient with uncontrollable bleeding from the tracheostomy site, and from the oropharyngeal mass. EXAM: BILATERAL COMMON CAROTID ARTERIOGRAMS AND BILATERAL EXTERNAL CAROTID ARTERIOGRAMS FOLLOWED BY ENDOVASCULAR SUPERSELECTIVE EMBOLIZATION OF THE SUPERIOR THYROIDAL ARTERIES BILATERALLY, AND THE LINGUAL ARTERIES BILATERALLY WITH  200-300 MICRON PVA PARTICLES AND 350-500 MICRON PVA PARTICLES: MEDICATIONS: Ancef 2 g. The antibiotic was administered within 1 hour of the procedure. ANESTHESIA/SEDATION: General anesthesia provided by the Department of Anesthesiology [facility]. CONTRAST:  80 mL OMNIPAQUE IOHEXOL 300 MG/ML  SOLN FLUOROSCOPY TIME:  Fluoroscopy Time: 64.2 minutes seconds. 2409MG y). COMPLICATIONS: None immediate. PROCEDURE: Informed consent was obtained from the patient following explanation of the procedure, risks, benefits and alternatives. The patient understands, agrees and consents for the procedure. All questions were addressed. A time out was performed prior to the initiation of the procedure. Maximal barrier sterile technique utilized including caps, mask, sterile gowns, sterile gloves, large sterile drape, hand hygiene, and Betadine prep. Following a full explanation of the procedure along with the potential associated complications, an informed witnessed consent was obtained from the patient and also her sister. Risks of an ischemic stroke of 1% was reviewed with them. After informed consent, patient was put under general anesthesia by the Department of Anesthesiology at [facility]. The right groin was prepped and draped in the usual sterile fashion. Thereafter using modified  Seldinger technique, transfemoral access nto the right common femoral artery was obtained without difficulty. Over a 0.035 inch guidewire a 5 [place] Pinnacle sheath was inserted. Through this, and also over a 0.035 inch guidewire a 5 [place] JB 1 catheter was advanced to the aortic arch region and selectively positioned in the right common carotid artery, the right external carotid artery, the left common carotid artery and the left external carotid artery. The patient tolerated the procedure well. FINDINGS: The right common carotid arteriogram demonstrates the right external carotid artery and its major branches to be widely patent. Abnormal prominence of the lingual artery and also the superior thyroidal artery leading inferiorly to a hypervascular mass was noted. The right internal carotid artery at the bulb demonstrated a focal shallow plaque without associated stenoses or ulcerations. The right internal carotid artery was seen to opacify normally to the cranial skull base. The petrous, cavernous and the supraclinoid segments are widely patent. A right posterior communicating artery is seen to opacify the right posterior cerebral artery distribution. The right middle and the right anterior cerebral arteries are seen to opacify normally into the capillary and venous phases. A few scattered focal areas of caliber irregularity involving the superior division of the right middle cerebral artery and the pericallosal artery on the right suggest intracranial arteriosclerosis. The left common carotid arteriogram demonstrates the left external carotid artery and its major branches to be widely patent. Again abnormal prominence of the superior thyroidal artery, and the lingual artery is noted. The delayed arterial phase demonstrates an exuberant hypervascular blush in the hypopharyngeal region. The left internal carotid artery at the bulb demonstrates a small ulcerative plaque associated with approximately 40% narrowing by  the NASCET criteria. More distally the vessel is seen to opacify to the cranial skull base. The petrous, the cavernous and the supraclinoid segments are seen to opacify normally. Left posterior communicating artery is seen opacifying the left posterior cerebral artery distribution. The left middle and the left anterior cerebral artery are seen to opacify into the capillary and the venous phases. Again demonstrated are small focal areas of mild caliber irregularity involving the left pericallosal and the left middle cerebral artery superior division. ENDOVASCULAR SUPERSELECTIVE EMBOLIZATION OF ABNORMALLY PROMINENT SUPERIOR THYROIDAL, AND THE LINGUAL ARTERIES BILATERALLY USING PVA PARTICLES: The 5 [place] JB 1 catheter in the right common carotid artery was exchanged for a 0.035 inch 300  cm Constance Holster exchange guidewire for a 6 [place] 65 cm Arrow neurovascular sheath using biplane roadmap technique and constant fluoroscopic guidance. Good aspiration was obtained from the hub of the neurovascular sheath. Gentle contrast injection demonstrated no evidence of spasms, dissections or of intraluminal filling defects. This was then connected to continuous heparinized saline infusion. Over the [facility] exchange guidewire, a 90 cm 6 [place] MDP, Softip curved guide catheter was then advanced and positioned just proximal to the right common carotid artery bifurcation. The guidewire was removed. Good aspiration was obtained from the 6 [place] guide catheter. This was then gently advanced over a 0.035 inch Roadrunner guidewire using biplane roadmap technique and constant fluoroscopic guidance and positioned just proximal to the origin of the superior thyroidal artery, and the lingual artery. At this time using biplane roadmap technique and constant fluoroscopic guidance, in a coaxial manner and with constant heparinized saline infusion, a rapid transit two tip microcatheter was advanced over a 0.014 inch Softip Synchro micro guidewire to  the distal end of the 6 [place] guide catheter in the right external carotid artery. Using a torque device and biplane roadmap technique and constant fluoroscopic guidance, the micro guidewire was advanced into the right superior thyroidal artery,. After having established safe positioning of the tip of the microcatheter, and established lack of dangerous communications, embolization of this vessel was then performed using PVA particles of sizes 200-300 microns, and 350-500 microns mixed with 75% contrast and 25% heparinized saline infusion. Embolizations were performed under intermittent fluoroscopy until there was stasis and/or reflux at the tip of the microcatheter. At the end of this, the microcatheter was retrieved more proximally. Control arteriogram performed through the 6 [place] guide catheter demonstrated complete stasis of contrast in the embolized vessel. Similarly, the right lingual artery was then entered using biplane roadmap technique and constant fluoroscopic guidance. Again after having confirmed safe positioning of the tip of the microcatheter, embolization with the PVA particles of sizes 200-300, and 350-500 microns was then performed mixed in with 25% heparinized saline and 75% contrast. An embolization was again performed until there was stasis at the tip of the microcatheter or reflux. The microcatheter was then retrieved slightly proximally. A control arteriogram performed through the microcatheter demonstrated complete angiographic stasis within the embolized vessel. The microcatheter was then gently retrieved and removed. The 6 [place] guide catheter was then gently retrieved into the right common carotid artery. A control arteriogram performed intracranially demonstrated no evidence of vessel occlusions, or of intraluminal filling defects. Attention was now shifted to the left common carotid artery. After having advanced the 6 [place] Arrow sheath inside of which again was the 90 cm 6 [place]  MDP curved Softip BriteTip guide catheter, arteriograms were performed of the left common carotid artery bifurcation and intracranially as described above. The 6 [place] guide catheter catheter was again advanced over a 0.035 inch Roadrunner guidewire using biplane roadmap technique and constant fluoroscopic guidance to just proximal to the origins of the left lingual artery and the left superior thyroidal artery. At this time, using biplane roadmap technique and constant fluoroscopic guidance, in a coaxial manner and with constant heparinized saline infusion, over a 0.014 inch Synchro micro guidewire, access was obtained into the left superior thyroidal artery and left lingual artery as described above. Again after having confirmed safe positioning of the microcatheter tips in the respective vessels eliminating any dangerous communications, embolization was performed with PVA particles of sizes 200-300 microns, and 350-500 microns in each of these vessels until  stasis was obtained. Control arteriograms performed after the embolization and in each of these vessels confirmed complete angiographic occlusion with stasis, the microcatheter was retrieved and completed. The 6 French BriteTip guide catheter was then retrieved into the common carotid artery on the left. Arteriograms performed centered intracranially continued to demonstrate no evidence of abnormal filling defects or vessel occlusions. The 6 [place] Arrow neurovascular sheath and the 6 [place] BriteTip guide catheter were then retrieved into the abdominal aorta and exchanged over a J-Tip guidewire for a 6 [place] Pinnacle sheath. This in itself was then replaced with an external closure device. Hemostasis was achieved at the skin entry site. Throughout the procedure, patient's neurologic status and hemodynamic status remained stable. The patient was then transported to the neuro ICU for further management. IMPRESSION: Endovascular superselective embolization of  abnormally prominent superior thyroidal arteries bilaterally, and the lingual arteries using PVA particles of sizes 200-300 microns, and 350-500 microns with stasis in the embolized vessels as described above. Electronically Signed   By: Luanne Bras M.D.   On: 10/17/2015 16:27   Ir Angio External Carotid Sel Ext Carotid Bilat Mod Sed  10/18/2015  INDICATION: History of her pharyngeal mass carcinoma. Patient with uncontrollable bleeding from the tracheostomy site, and from the oropharyngeal mass. EXAM: BILATERAL COMMON CAROTID ARTERIOGRAMS AND BILATERAL EXTERNAL CAROTID ARTERIOGRAMS FOLLOWED BY ENDOVASCULAR SUPERSELECTIVE EMBOLIZATION OF THE SUPERIOR THYROIDAL ARTERIES BILATERALLY, AND THE LINGUAL ARTERIES BILATERALLY WITH 200-300 MICRON PVA PARTICLES AND 350-500 MICRON PVA PARTICLES: MEDICATIONS: Ancef 2 g. The antibiotic was administered within 1 hour of the procedure. ANESTHESIA/SEDATION: General anesthesia provided by the Department of Anesthesiology [facility]. CONTRAST:  80 mL OMNIPAQUE IOHEXOL 300 MG/ML  SOLN FLUOROSCOPY TIME:  Fluoroscopy Time: 64.2 minutes seconds. 2409MG y). COMPLICATIONS: None immediate. PROCEDURE: Informed consent was obtained from the patient following explanation of the procedure, risks, benefits and alternatives. The patient understands, agrees and consents for the procedure. All questions were addressed. A time out was performed prior to the initiation of the procedure. Maximal barrier sterile technique utilized including caps, mask, sterile gowns, sterile gloves, large sterile drape, hand hygiene, and Betadine prep. Following a full explanation of the procedure along with the potential associated complications, an informed witnessed consent was obtained from the patient and also her sister. Risks of an ischemic stroke of 1% was reviewed with them. After informed consent, patient was put under general anesthesia by the Department of Anesthesiology at [facility]. The right groin was prepped and draped in the usual sterile fashion. Thereafter using modified Seldinger technique, transfemoral access nto the right common femoral artery was obtained without difficulty. Over a 0.035 inch guidewire a 5 French Pinnacle sheath was inserted. Through this, and also over a 0.035 inch guidewire a 5 [place] JB 1 catheter was advanced to the aortic arch region and selectively positioned in the right common carotid artery, the right external carotid artery, the left common carotid artery and the left external carotid artery. The patient tolerated the procedure well. FINDINGS: The right common carotid arteriogram demonstrates the right external carotid artery and its major branches to be widely patent. Abnormal prominence of the lingual artery and also the superior thyroidal artery leading inferiorly to a hypervascular mass was noted. The right internal carotid artery at the bulb demonstrated a focal shallow plaque without associated stenoses or ulcerations. The right internal carotid artery was seen to opacify normally to the cranial skull base. The petrous, cavernous and the supraclinoid segments are widely patent. A right posterior  communicating artery is seen to opacify the right posterior cerebral artery distribution. The right middle and the right anterior cerebral arteries are seen to opacify normally into the capillary and venous phases. A few scattered focal areas of caliber irregularity involving the superior division of the right middle cerebral artery and the pericallosal artery on the right suggest intracranial arteriosclerosis. The left common carotid arteriogram demonstrates the left external carotid artery and its major branches to be widely patent. Again abnormal prominence of the superior thyroidal artery, and the lingual artery is noted. The delayed arterial phase demonstrates an exuberant hypervascular blush in the hypopharyngeal region. The left internal carotid  artery at the bulb demonstrates a small ulcerative plaque associated with approximately 40% narrowing by the NASCET criteria. More distally the vessel is seen to opacify to the cranial skull base. The petrous, the cavernous and the supraclinoid segments are seen to opacify normally. Left posterior communicating artery is seen opacifying the left posterior cerebral artery distribution. The left middle and the left anterior cerebral artery are seen to opacify into the capillary and the venous phases. Again demonstrated are small focal areas of mild caliber irregularity involving the left pericallosal and the left middle cerebral artery superior division. ENDOVASCULAR SUPERSELECTIVE EMBOLIZATION OF ABNORMALLY PROMINENT SUPERIOR THYROIDAL, AND THE LINGUAL ARTERIES BILATERALLY USING PVA PARTICLES: The 5 [place] JB 1 catheter in the right common carotid artery was exchanged for a 0.035 inch 300 cm Rosen exchange guidewire for a 6 French 65 cm Arrow neurovascular sheath using biplane roadmap technique and constant fluoroscopic guidance. Good aspiration was obtained from the hub of the neurovascular sheath. Gentle contrast injection demonstrated no evidence of spasms, dissections or of intraluminal filling defects. This was then connected to continuous heparinized saline infusion. Over the [facility] exchange guidewire, a 90 cm 6 [place] MDP, Softip curved guide catheter was then advanced and positioned just proximal to the right common carotid artery bifurcation. The guidewire was removed. Good aspiration was obtained from the 6 [place] guide catheter. This was then gently advanced over a 0.035 inch Roadrunner guidewire using biplane roadmap technique and constant fluoroscopic guidance and positioned just proximal to the origin of the superior thyroidal artery, and the lingual artery. At this time using biplane roadmap technique and constant fluoroscopic guidance, in a coaxial manner and with constant heparinized saline infusion,  a rapid transit two tip microcatheter was advanced over a 0.014 inch Softip Synchro micro guidewire to the distal end of the 6 [place] guide catheter in the right external carotid artery. Using a torque device and biplane roadmap technique and constant fluoroscopic guidance, the micro guidewire was advanced into the right superior thyroidal artery,. After having established safe positioning of the tip of the microcatheter, and established lack of dangerous communications, embolization of this vessel was then performed using PVA particles of sizes 200-300 microns, and 350-500 microns mixed with 75% contrast and 25% heparinized saline infusion. Embolizations were performed under intermittent fluoroscopy until there was stasis and/or reflux at the tip of the microcatheter. At the end of this, the microcatheter was retrieved more proximally. Control arteriogram performed through the 6 [place] guide catheter demonstrated complete stasis of contrast in the embolized vessel. Similarly, the right lingual artery was then entered using biplane roadmap technique and constant fluoroscopic guidance. Again after having confirmed safe positioning of the tip of the microcatheter, embolization with the PVA particles of sizes 200-300, and 350-500 microns was then performed mixed in with 25% heparinized saline and 75% contrast. An embolization was again  performed until there was stasis at the tip of the microcatheter or reflux. The microcatheter was then retrieved slightly proximally. A control arteriogram performed through the microcatheter demonstrated complete angiographic stasis within the embolized vessel. The microcatheter was then gently retrieved and removed. The 6 [place] guide catheter was then gently retrieved into the right common carotid artery. A control arteriogram performed intracranially demonstrated no evidence of vessel occlusions, or of intraluminal filling defects. Attention was now shifted to the left common carotid  artery. After having advanced the 6 [place] Arrow sheath inside of which again was the 90 cm 6 [place] MDP curved Softip BriteTip guide catheter, arteriograms were performed of the left common carotid artery bifurcation and intracranially as described above. The 6 [place] guide catheter catheter was again advanced over a 0.035 inch Roadrunner guidewire using biplane roadmap technique and constant fluoroscopic guidance to just proximal to the origins of the left lingual artery and the left superior thyroidal artery. At this time, using biplane roadmap technique and constant fluoroscopic guidance, in a coaxial manner and with constant heparinized saline infusion, over a 0.014 inch Synchro micro guidewire, access was obtained into the left superior thyroidal artery and left lingual artery as described above. Again after having confirmed safe positioning of the microcatheter tips in the respective vessels eliminating any dangerous communications, embolization was performed with PVA particles of sizes 200-300 microns, and 350-500 microns in each of these vessels until stasis was obtained. Control arteriograms performed after the embolization and in each of these vessels confirmed complete angiographic occlusion with stasis, the microcatheter was retrieved and completed. The 6 French BriteTip guide catheter was then retrieved into the common carotid artery on the left. Arteriograms performed centered intracranially continued to demonstrate no evidence of abnormal filling defects or vessel occlusions. The 6 [place] Arrow neurovascular sheath and the 6 [place] BriteTip guide catheter were then retrieved into the abdominal aorta and exchanged over a J-Tip guidewire for a 6 [place] Pinnacle sheath. This in itself was then replaced with an external closure device. Hemostasis was achieved at the skin entry site. Throughout the procedure, patient's neurologic status and hemodynamic status remained stable. The patient was then  transported to the neuro ICU for further management. IMPRESSION: Endovascular superselective embolization of abnormally prominent superior thyroidal arteries bilaterally, and the lingual arteries using PVA particles of sizes 200-300 microns, and 350-500 microns with stasis in the embolized vessels as described above. Electronically Signed   By: Luanne Bras M.D.   On: 10/17/2015 16:27   Ir Neuro Each Add'l After Basic Uni Left (ms)  10/18/2015  INDICATION: History of her pharyngeal mass carcinoma. Patient with uncontrollable bleeding from the tracheostomy site, and from the oropharyngeal mass. EXAM: BILATERAL COMMON CAROTID ARTERIOGRAMS AND BILATERAL EXTERNAL CAROTID ARTERIOGRAMS FOLLOWED BY ENDOVASCULAR SUPERSELECTIVE EMBOLIZATION OF THE SUPERIOR THYROIDAL ARTERIES BILATERALLY, AND THE LINGUAL ARTERIES BILATERALLY WITH 200-300 MICRON PVA PARTICLES AND 350-500 MICRON PVA PARTICLES: MEDICATIONS: Ancef 2 g. The antibiotic was administered within 1 hour of the procedure. ANESTHESIA/SEDATION: General anesthesia provided by the Department of Anesthesiology [facility]. CONTRAST:  80 mL OMNIPAQUE IOHEXOL 300 MG/ML  SOLN FLUOROSCOPY TIME:  Fluoroscopy Time: 64.2 minutes seconds. 2409MG y). COMPLICATIONS: None immediate. PROCEDURE: Informed consent was obtained from the patient following explanation of the procedure, risks, benefits and alternatives. The patient understands, agrees and consents for the procedure. All questions were addressed. A time out was performed prior to the initiation of the procedure. Maximal barrier sterile technique utilized including caps, mask, sterile gowns, sterile gloves,  large sterile drape, hand hygiene, and Betadine prep. Following a full explanation of the procedure along with the potential associated complications, an informed witnessed consent was obtained from the patient and also her sister. Risks of an ischemic stroke of 1% was reviewed with them. After informed  consent, patient was put under general anesthesia by the Department of Anesthesiology at [facility]. The right groin was prepped and draped in the usual sterile fashion. Thereafter using modified Seldinger technique, transfemoral access nto the right common femoral artery was obtained without difficulty. Over a 0.035 inch guidewire a 5 French Pinnacle sheath was inserted. Through this, and also over a 0.035 inch guidewire a 5 [place] JB 1 catheter was advanced to the aortic arch region and selectively positioned in the right common carotid artery, the right external carotid artery, the left common carotid artery and the left external carotid artery. The patient tolerated the procedure well. FINDINGS: The right common carotid arteriogram demonstrates the right external carotid artery and its major branches to be widely patent. Abnormal prominence of the lingual artery and also the superior thyroidal artery leading inferiorly to a hypervascular mass was noted. The right internal carotid artery at the bulb demonstrated a focal shallow plaque without associated stenoses or ulcerations. The right internal carotid artery was seen to opacify normally to the cranial skull base. The petrous, cavernous and the supraclinoid segments are widely patent. A right posterior communicating artery is seen to opacify the right posterior cerebral artery distribution. The right middle and the right anterior cerebral arteries are seen to opacify normally into the capillary and venous phases. A few scattered focal areas of caliber irregularity involving the superior division of the right middle cerebral artery and the pericallosal artery on the right suggest intracranial arteriosclerosis. The left common carotid arteriogram demonstrates the left external carotid artery and its major branches to be widely patent. Again abnormal prominence of the superior thyroidal artery, and the lingual artery is noted. The delayed arterial phase  demonstrates an exuberant hypervascular blush in the hypopharyngeal region. The left internal carotid artery at the bulb demonstrates a small ulcerative plaque associated with approximately 40% narrowing by the NASCET criteria. More distally the vessel is seen to opacify to the cranial skull base. The petrous, the cavernous and the supraclinoid segments are seen to opacify normally. Left posterior communicating artery is seen opacifying the left posterior cerebral artery distribution. The left middle and the left anterior cerebral artery are seen to opacify into the capillary and the venous phases. Again demonstrated are small focal areas of mild caliber irregularity involving the left pericallosal and the left middle cerebral artery superior division. ENDOVASCULAR SUPERSELECTIVE EMBOLIZATION OF ABNORMALLY PROMINENT SUPERIOR THYROIDAL, AND THE LINGUAL ARTERIES BILATERALLY USING PVA PARTICLES: The 5 [place] JB 1 catheter in the right common carotid artery was exchanged for a 0.035 inch 300 cm Rosen exchange guidewire for a 6 French 65 cm Arrow neurovascular sheath using biplane roadmap technique and constant fluoroscopic guidance. Good aspiration was obtained from the hub of the neurovascular sheath. Gentle contrast injection demonstrated no evidence of spasms, dissections or of intraluminal filling defects. This was then connected to continuous heparinized saline infusion. Over the [facility] exchange guidewire, a 90 cm 6 [place] MDP, Softip curved guide catheter was then advanced and positioned just proximal to the right common carotid artery bifurcation. The guidewire was removed. Good aspiration was obtained from the 6 [place] guide catheter. This was then gently advanced over a 0.035 inch Roadrunner guidewire using biplane  roadmap technique and constant fluoroscopic guidance and positioned just proximal to the origin of the superior thyroidal artery, and the lingual artery. At this time using biplane roadmap technique  and constant fluoroscopic guidance, in a coaxial manner and with constant heparinized saline infusion, a rapid transit two tip microcatheter was advanced over a 0.014 inch Softip Synchro micro guidewire to the distal end of the 6 [place] guide catheter in the right external carotid artery. Using a torque device and biplane roadmap technique and constant fluoroscopic guidance, the micro guidewire was advanced into the right superior thyroidal artery,. After having established safe positioning of the tip of the microcatheter, and established lack of dangerous communications, embolization of this vessel was then performed using PVA particles of sizes 200-300 microns, and 350-500 microns mixed with 75% contrast and 25% heparinized saline infusion. Embolizations were performed under intermittent fluoroscopy until there was stasis and/or reflux at the tip of the microcatheter. At the end of this, the microcatheter was retrieved more proximally. Control arteriogram performed through the 6 [place] guide catheter demonstrated complete stasis of contrast in the embolized vessel. Similarly, the right lingual artery was then entered using biplane roadmap technique and constant fluoroscopic guidance. Again after having confirmed safe positioning of the tip of the microcatheter, embolization with the PVA particles of sizes 200-300, and 350-500 microns was then performed mixed in with 25% heparinized saline and 75% contrast. An embolization was again performed until there was stasis at the tip of the microcatheter or reflux. The microcatheter was then retrieved slightly proximally. A control arteriogram performed through the microcatheter demonstrated complete angiographic stasis within the embolized vessel. The microcatheter was then gently retrieved and removed. The 6 [place] guide catheter was then gently retrieved into the right common carotid artery. A control arteriogram performed intracranially demonstrated no evidence of  vessel occlusions, or of intraluminal filling defects. Attention was now shifted to the left common carotid artery. After having advanced the 6 [place] Arrow sheath inside of which again was the 90 cm 6 [place] MDP curved Softip BriteTip guide catheter, arteriograms were performed of the left common carotid artery bifurcation and intracranially as described above. The 6 [place] guide catheter catheter was again advanced over a 0.035 inch Roadrunner guidewire using biplane roadmap technique and constant fluoroscopic guidance to just proximal to the origins of the left lingual artery and the left superior thyroidal artery. At this time, using biplane roadmap technique and constant fluoroscopic guidance, in a coaxial manner and with constant heparinized saline infusion, over a 0.014 inch Synchro micro guidewire, access was obtained into the left superior thyroidal artery and left lingual artery as described above. Again after having confirmed safe positioning of the microcatheter tips in the respective vessels eliminating any dangerous communications, embolization was performed with PVA particles of sizes 200-300 microns, and 350-500 microns in each of these vessels until stasis was obtained. Control arteriograms performed after the embolization and in each of these vessels confirmed complete angiographic occlusion with stasis, the microcatheter was retrieved and completed. The 6 [place] BriteTip guide catheter was then retrieved into the common carotid artery on the left. Arteriograms performed centered intracranially continued to demonstrate no evidence of abnormal filling defects or vessel occlusions. The 6 [place] Arrow neurovascular sheath and the 6 [place] BriteTip guide catheter were then retrieved into the abdominal aorta and exchanged over a J-Tip guidewire for a 6 [place] Pinnacle sheath. This in itself was then replaced with an external closure device. Hemostasis was achieved at the skin entry site.  Throughout the  procedure, patient's neurologic status and hemodynamic status remained stable. The patient was then transported to the neuro ICU for further management. IMPRESSION: Endovascular superselective embolization of abnormally prominent superior thyroidal arteries bilaterally, and the lingual arteries using PVA particles of sizes 200-300 microns, and 350-500 microns with stasis in the embolized vessels as described above. Electronically Signed   By: Luanne Bras M.D.   On: 10/17/2015 16:27   Ir Neuro Each Add'l After Basic Uni Right (ms)  10/18/2015  INDICATION: History of her pharyngeal mass carcinoma. Patient with uncontrollable bleeding from the tracheostomy site, and from the oropharyngeal mass. EXAM: BILATERAL COMMON CAROTID ARTERIOGRAMS AND BILATERAL EXTERNAL CAROTID ARTERIOGRAMS FOLLOWED BY ENDOVASCULAR SUPERSELECTIVE EMBOLIZATION OF THE SUPERIOR THYROIDAL ARTERIES BILATERALLY, AND THE LINGUAL ARTERIES BILATERALLY WITH 200-300 MICRON PVA PARTICLES AND 350-500 MICRON PVA PARTICLES: MEDICATIONS: Ancef 2 g. The antibiotic was administered within 1 hour of the procedure. ANESTHESIA/SEDATION: General anesthesia provided by the Department of Anesthesiology [facility]. CONTRAST:  80 mL OMNIPAQUE IOHEXOL 300 MG/ML  SOLN FLUOROSCOPY TIME:  Fluoroscopy Time: 64.2 minutes seconds. 2409MG y). COMPLICATIONS: None immediate. PROCEDURE: Informed consent was obtained from the patient following explanation of the procedure, risks, benefits and alternatives. The patient understands, agrees and consents for the procedure. All questions were addressed. A time out was performed prior to the initiation of the procedure. Maximal barrier sterile technique utilized including caps, mask, sterile gowns, sterile gloves, large sterile drape, hand hygiene, and Betadine prep. Following a full explanation of the procedure along with the potential associated complications, an informed witnessed consent was obtained from the patient  and also her sister. Risks of an ischemic stroke of 1% was reviewed with them. After informed consent, patient was put under general anesthesia by the Department of Anesthesiology at [facility]. The right groin was prepped and draped in the usual sterile fashion. Thereafter using modified Seldinger technique, transfemoral access nto the right common femoral artery was obtained without difficulty. Over a 0.035 inch guidewire a 5 French Pinnacle sheath was inserted. Through this, and also over a 0.035 inch guidewire a 5 [place] JB 1 catheter was advanced to the aortic arch region and selectively positioned in the right common carotid artery, the right external carotid artery, the left common carotid artery and the left external carotid artery. The patient tolerated the procedure well. FINDINGS: The right common carotid arteriogram demonstrates the right external carotid artery and its major branches to be widely patent. Abnormal prominence of the lingual artery and also the superior thyroidal artery leading inferiorly to a hypervascular mass was noted. The right internal carotid artery at the bulb demonstrated a focal shallow plaque without associated stenoses or ulcerations. The right internal carotid artery was seen to opacify normally to the cranial skull base. The petrous, cavernous and the supraclinoid segments are widely patent. A right posterior communicating artery is seen to opacify the right posterior cerebral artery distribution. The right middle and the right anterior cerebral arteries are seen to opacify normally into the capillary and venous phases. A few scattered focal areas of caliber irregularity involving the superior division of the right middle cerebral artery and the pericallosal artery on the right suggest intracranial arteriosclerosis. The left common carotid arteriogram demonstrates the left external carotid artery and its major branches to be widely patent. Again abnormal prominence  of the superior thyroidal artery, and the lingual artery is noted. The delayed arterial phase demonstrates an exuberant hypervascular blush in the hypopharyngeal region. The left internal carotid artery  at the bulb demonstrates a small ulcerative plaque associated with approximately 40% narrowing by the NASCET criteria. More distally the vessel is seen to opacify to the cranial skull base. The petrous, the cavernous and the supraclinoid segments are seen to opacify normally. Left posterior communicating artery is seen opacifying the left posterior cerebral artery distribution. The left middle and the left anterior cerebral artery are seen to opacify into the capillary and the venous phases. Again demonstrated are small focal areas of mild caliber irregularity involving the left pericallosal and the left middle cerebral artery superior division. ENDOVASCULAR SUPERSELECTIVE EMBOLIZATION OF ABNORMALLY PROMINENT SUPERIOR THYROIDAL, AND THE LINGUAL ARTERIES BILATERALLY USING PVA PARTICLES: The 5 [place] JB 1 catheter in the right common carotid artery was exchanged for a 0.035 inch 300 cm Rosen exchange guidewire for a 6 French 65 cm Arrow neurovascular sheath using biplane roadmap technique and constant fluoroscopic guidance. Good aspiration was obtained from the hub of the neurovascular sheath. Gentle contrast injection demonstrated no evidence of spasms, dissections or of intraluminal filling defects. This was then connected to continuous heparinized saline infusion. Over the [facility] exchange guidewire, a 90 cm 6 [place] MDP, Softip curved guide catheter was then advanced and positioned just proximal to the right common carotid artery bifurcation. The guidewire was removed. Good aspiration was obtained from the 6 [place] guide catheter. This was then gently advanced over a 0.035 inch Roadrunner guidewire using biplane roadmap technique and constant fluoroscopic guidance and positioned just proximal to the origin of the  superior thyroidal artery, and the lingual artery. At this time using biplane roadmap technique and constant fluoroscopic guidance, in a coaxial manner and with constant heparinized saline infusion, a rapid transit two tip microcatheter was advanced over a 0.014 inch Softip Synchro micro guidewire to the distal end of the 6 [place] guide catheter in the right external carotid artery. Using a torque device and biplane roadmap technique and constant fluoroscopic guidance, the micro guidewire was advanced into the right superior thyroidal artery,. After having established safe positioning of the tip of the microcatheter, and established lack of dangerous communications, embolization of this vessel was then performed using PVA particles of sizes 200-300 microns, and 350-500 microns mixed with 75% contrast and 25% heparinized saline infusion. Embolizations were performed under intermittent fluoroscopy until there was stasis and/or reflux at the tip of the microcatheter. At the end of this, the microcatheter was retrieved more proximally. Control arteriogram performed through the 6 [place] guide catheter demonstrated complete stasis of contrast in the embolized vessel. Similarly, the right lingual artery was then entered using biplane roadmap technique and constant fluoroscopic guidance. Again after having confirmed safe positioning of the tip of the microcatheter, embolization with the PVA particles of sizes 200-300, and 350-500 microns was then performed mixed in with 25% heparinized saline and 75% contrast. An embolization was again performed until there was stasis at the tip of the microcatheter or reflux. The microcatheter was then retrieved slightly proximally. A control arteriogram performed through the microcatheter demonstrated complete angiographic stasis within the embolized vessel. The microcatheter was then gently retrieved and removed. The 6 [place] guide catheter was then gently retrieved into the right common  carotid artery. A control arteriogram performed intracranially demonstrated no evidence of vessel occlusions, or of intraluminal filling defects. Attention was now shifted to the left common carotid artery. After having advanced the 6 [place] Arrow sheath inside of which again was the 90 cm 6 [place] MDP curved Softip BriteTip guide catheter, arteriograms were performed  of the left common carotid artery bifurcation and intracranially as described above. The 6 [place] guide catheter catheter was again advanced over a 0.035 inch Roadrunner guidewire using biplane roadmap technique and constant fluoroscopic guidance to just proximal to the origins of the left lingual artery and the left superior thyroidal artery. At this time, using biplane roadmap technique and constant fluoroscopic guidance, in a coaxial manner and with constant heparinized saline infusion, over a 0.014 inch Synchro micro guidewire, access was obtained into the left superior thyroidal artery and left lingual artery as described above. Again after having confirmed safe positioning of the microcatheter tips in the respective vessels eliminating any dangerous communications, embolization was performed with PVA particles of sizes 200-300 microns, and 350-500 microns in each of these vessels until stasis was obtained. Control arteriograms performed after the embolization and in each of these vessels confirmed complete angiographic occlusion with stasis, the microcatheter was retrieved and completed. The 6 [place] BriteTip guide catheter was then retrieved into the common carotid artery on the left. Arteriograms performed centered intracranially continued to demonstrate no evidence of abnormal filling defects or vessel occlusions. The 6 [place] Arrow neurovascular sheath and the 6 [place] BriteTip guide catheter were then retrieved into the abdominal aorta and exchanged over a J-Tip guidewire for a 6 [place] Pinnacle sheath. This in itself was then replaced with  an external closure device. Hemostasis was achieved at the skin entry site. Throughout the procedure, patient's neurologic status and hemodynamic status remained stable. The patient was then transported to the neuro ICU for further management. IMPRESSION: Endovascular superselective embolization of abnormally prominent superior thyroidal arteries bilaterally, and the lingual arteries using PVA particles of sizes 200-300 microns, and 350-500 microns with stasis in the embolized vessels as described above. Electronically Signed   By: Luanne Bras M.D.   On: 10/17/2015 16:27     PERTINENT LAB RESULTS: CBC:  Recent Labs  10/21/15 1627  WBC 25.6*  HGB 9.5*  HCT 30.3*  PLT 321   CMET CMP     Component Value Date/Time   NA 148* 10/23/2015 0824   K 3.0* 10/23/2015 0824   CL 114* 10/23/2015 0824   CO2 24 10/23/2015 0824   GLUCOSE 93 10/23/2015 0824   BUN 17 10/23/2015 0824   CREATININE 0.91 10/23/2015 0824   CREATININE 0.78 08/17/2013 0929   CALCIUM 8.8* 10/23/2015 0824   PROT 6.4* 10/17/2015 0500   ALBUMIN 1.9* 10/17/2015 0500   AST 16 10/17/2015 0500   ALT 9* 10/17/2015 0500   ALKPHOS 130* 10/17/2015 0500   BILITOT 0.2* 10/17/2015 0500   GFRNONAA >60 10/23/2015 0824   GFRNONAA 85 08/17/2013 0929   GFRAA >60 10/23/2015 0824   GFRAA >89 08/17/2013 0929    GFR Estimated Creatinine Clearance: 68.6 mL/min (by C-G formula based on Cr of 0.91). No results for input(s): LIPASE, AMYLASE in the last 72 hours. No results for input(s): CKTOTAL, CKMB, CKMBINDEX, TROPONINI in the last 72 hours. Invalid input(s): POCBNP No results for input(s): DDIMER in the last 72 hours. No results for input(s): HGBA1C in the last 72 hours. No results for input(s): CHOL, HDL, LDLCALC, TRIG, CHOLHDL, LDLDIRECT in the last 72 hours. No results for input(s): TSH, T4TOTAL, T3FREE, THYROIDAB in the last 72 hours.  Invalid input(s): FREET3 No results for input(s): VITAMINB12, FOLATE, FERRITIN, TIBC,  IRON, RETICCTPCT in the last 72 hours. Coags: No results for input(s): INR in the last 72 hours.  Invalid input(s): PT Microbiology: Recent Results (from the past  240 hour(s))  MRSA PCR Screening     Status: None   Collection Time: 10/16/15 12:30 AM  Result Value Ref Range Status   MRSA by PCR NEGATIVE NEGATIVE Final    Comment:        The GeneXpert MRSA Assay (FDA approved for NASAL specimens only), is one component of a comprehensive MRSA colonization surveillance program. It is not intended to diagnose MRSA infection nor to guide or monitor treatment for MRSA infections.      BRIEF HOSPITAL COURSE:  Displaced PICC line: Reinserted 3/11, resumed ampicillin, fluconazole and TNA.  Active Problems: Hypokalemia: Repleted-but still persists. Due to poor oral intake-and no TNA due to dislodged PICC line.Have asked Case management to let Home health agency know that patient will need frequent BMET and adjustment of TNA to ensure proper potassium supplementation.  Hypernatremia: Mild-secondary to dehydration. Back on TNA  Invasive laryngeal squamous cell cancer: continue outpatient follow-up with ENT and oncology. Also has a follow-up appointment with oncology/ENT at [facility]. Tracheostomy in place. Patient aware of very poor overall prognoses. Review of prior notes indicate patient and family have been very resistant to hospice/palliative measures.  Enterococcal bacteremia/Candida albicans fungemia: On IV ampicillin and fluconazole via PICC line. Antimicrobial Stop date 3/15.  History of renal mass: Very suspicious for malignancy. Outpatient follow-up with urology and oncology.   Dysphagia: Secondary to laryngeal mass-nothing by mouth status. Unable to place a PEG tube in the past because of history of GI bleeding with a ulcer. Plans are to follow-up with gastroenterology in the next few weeks for outpatient PEG tube placement.  History of GI bleeding: This occurred a few  months ago, endoscopy revealed a large gastric ulcer-GI bleeding resolved after embolization.  Anemia: hemoglobin much improved than previous hemoglobin-suspect this is mostly due to chronic disease.  Leukocytosis: Appears to be chronic-from chronic inflammatory state-due to malignancy in her neck. On IV ampicillin and fluconazole.  Type 2 diabetes: Continue SSI.  GERD: Continue PPI  TODAY-DAY OF DISCHARGE:  Subjective:   Niesa Derstine today has no headache,no chest abdominal pain,no new weakness tingling or numbness, feels much better wants to go home today.   Objective:   Blood pressure 128/59, pulse 106, temperature 97.7 F (36.5 C), temperature source Oral, resp. rate 20, height 5\' 3"  (1.6 m), weight 84.732 kg (186 lb 12.8 oz), SpO2 100 %.  Intake/Output Summary (Last 24 hours) at 10/23/15 1113 Last data filed at 10/22/15 1943  Gross per 24 hour  Intake      0 ml  Output    300 ml  Net   -300 ml   Filed Weights   10/22/15 0031  Weight: 84.732 kg (186 lb 12.8 oz)    Exam Awake Alert, Oriented *3, No new F.N deficits, Normal affect [place].AT,PERRAL Supple Neck,No JVD, No cervical lymphadenopathy appriciated.  Symmetrical Chest wall movement, Good air movement bilaterally, CTAB RRR,No Gallops,Rubs or new Murmurs, No Parasternal Heave +ve B.Sounds, Abd Soft, Non tender, No organomegaly appriciated, No rebound -guarding or rigidity. No Cyanosis, Clubbing or edema, No new Rash or bruise  DISCHARGE CONDITION: Stable  DISPOSITION: Home with home health services  DISCHARGE INSTRUCTIONS:    Activity:  As tolerated with Full fall precautions use walker/cane & assistance as needed  Get Medicines reviewed and adjusted: Please take all your medications with you for your next visit with your Primary MD  Please request your Primary MD to go over all hospital tests and procedure/radiological results at the follow up,  please ask your Primary MD to get all Hospital records sent to  his/her office.  If you experience worsening of your admission symptoms, develop shortness of breath, life threatening emergency, suicidal or homicidal thoughts you must seek medical attention immediately by calling 911 or calling your MD immediately  if symptoms less severe.  You must read complete instructions/literature along with all the possible adverse reactions/side effects for all the Medicines you take and that have been prescribed to you. Take any new Medicines after you have completely understood and accpet all the possible adverse reactions/side effects.   Do not drive when taking Pain medications.   Do not take more than prescribed Pain, Sleep and Anxiety Medications  Special Instructions: If you have smoked or chewed Tobacco  in the last 2 yrs please stop smoking, stop any regular Alcohol  and or any Recreational drug use.  Wear Seat belts while driving.  Please note  You were cared for by a hospitalist during your hospital stay. Once you are discharged, your primary care physician will handle any further medical issues. Please note that NO REFILLS for any discharge medications will be authorized once you are discharged, as it is imperative that you return to your primary care physician (or establish a relationship with a primary care physician if you do not have one) for your aftercare needs so that they can reassess your need for medications and monitor your lab values.   Diet recommendation: NPO  Discharge Instructions    Discharge instructions    Complete by:  As directed   npo     Increase activity slowly    Complete by:  As directed            Follow-up Information    Follow up with Rafael Hernandez.   Why:  home health IV admoinistration and line maintenance   Contact information:   [address] [place] [address] [phone]       Follow up with Arnoldo Morale, MD On 11/01/2015.   Specialty:  Family Medicine   Why:  appt at 10:20  am   Contact information:   [place] [address] [phone]       Follow up with Hvozdovic, Vita Barley, PA-C On 10/25/2015.   Specialty:  Gastroenterology   Why:  appt 11 am   Contact information:   [place] [address] [phone]       Follow up with Izora Gala, MD. Schedule an appointment as soon as possible for a visit today.   Specialty:  Otolaryngology   Contact information:   [address] [place] [address] [phone]       Follow up with [facility], NI, MD. Schedule an appointment as soon as possible for a visit in 1 week.   Specialty:  Hematology and Oncology   Contact information:   [place] [address] [phone]      Total Time spent on discharge equals 25 minutes.  SignedOren Binet 10/23/2015 11:13 AM

## 2015-10-22 NOTE — Progress Notes (Signed)
Confirmed with Judson Roch, RN, case management - will follow-up to reinitiate home health.

## 2015-10-22 NOTE — Procedures (Signed)
Interventional Radiology Procedure Note  Procedure: Placement of a right arm SL PowerPICC.  Tip at cavoatrial jxn and ready for use.   Complications: None  Estimated Blood Loss: None  Recommendations: - Routine line care  Signed,  Criselda Peaches, MD

## 2015-10-22 NOTE — Progress Notes (Signed)
Spoke with RN regarding PICC placement.  States IR is scheduled to place PICC this am around 9.  Notified to consult VAST PICC RN if IR unable to place today, otherwise to d/c VAST PICC order.

## 2015-10-22 NOTE — Progress Notes (Addendum)
PARENTERAL NUTRITION CONSULT NOTE - INITIAL  Pharmacy Consult for TPN Indication: Laryngeal CA, continuing TPN from home  No Known Allergies  Patient Measurements: Height: 5\' 3"  (160 cm) Weight: 186 lb 12.8 oz (84.732 kg) IBW/kg (Calculated) : 52.4 Adjusted Body Weight:  Usual Weight:   Vital Signs: Temp: 98.5 F (36.9 C) (03/11 0245) Temp Source: Oral (03/11 0245) BP: 137/81 mmHg (03/11 0245) Pulse Rate: 94 (03/11 0438) Intake/Output from previous day: 03/10 0701 - 03/11 0700 In: 100 [IV Piggyback:100] Out: 100 [Urine:100] Intake/Output from this shift:    Labs:  Recent Labs  10/21/15 1627  WBC 25.6*  HGB 9.5*  HCT 30.3*  PLT 321     Recent Labs  10/21/15 1627 10/21/15 1630 10/22/15 0808  NA 144  --  148*  K 2.8*  --  2.6*  CL 108  --  112*  CO2 25  --  24  GLUCOSE 278*  --  113*  BUN 23*  --  19  CREATININE 0.73  --  0.88  CALCIUM 9.1  --  8.8*  MG  --  1.7  --    Estimated Creatinine Clearance: 71 mL/min (by C-G formula based on Cr of 0.88).    Recent Labs  10/21/15 2206 10/22/15 0633  GLUCAP 141* 122*    Medical History: Past Medical History  Diagnosis Date  . Thyroid disease   . Hypertension   . Neck mass hospitalized 08/29/2015  . Type II diabetes mellitus ([place])   . Left kidney mass   . Anemia, chronic disease     Medications:  Scheduled:  . ampicillin (OMNIPEN) IV  2 g Intravenous Q4H  . enoxaparin (LOVENOX) injection  40 mg Subcutaneous QHS  . fluconazole (DIFLUCAN) IV  400 mg Intravenous Q24H  . insulin aspart  0-5 Units Subcutaneous QHS  . insulin aspart  0-9 Units Subcutaneous TID WC  . lidocaine      . pantoprazole  40 mg Oral Daily  . potassium chloride  10 mEq Intravenous Q1 Hr x 5  . potassium chloride      . potassium chloride  40 mEq Oral Daily    Insulin Requirements Sensitive SSI  Current Nutrition:  NPO  Nutritional Goals:  Awaiting RD assessment  ** 3/11:  Current formula per [facility] is 2076mL of TPN  with lipids (3:1) at 39ml/hr, providing 1899 kcal & 100g protein  Assessment: 60 y/o female on home TPN with recent admit for hemoptysis & d/c home on 10/18/15; now re-admitted with displaced PICC line.. She has laryngeal cancer and is s/p trach. She has been on home TPN due to unclear inability to obtain PEG - GI input planned.  PICC replaced this AM.  Surgeries/Procedures: 3/4 s/p IR embolization bilateral lingual and superior thyroidal arteries  GI:   3/6:  Albumin low at 1.9. Prealbumin down to 9.6 from 17.1 on 2/28. PPI-po  Endo:  Serum gluc 113.  Sensitive SSI AC/HS  Lytes:  K 2.6- K runs x 5 ordered (s/p 2 runs last PM) & KCl 40 po daily, Na 148.  On 1/2NS at 29ml/hr  Renal: Cr 0.88, CrCl ~70 ml/hr.   Pulm:  trach  Cards: VSS  Hepatobil: LFTs ok and Tbili 0.2,TG down to 172 on 3/6  ID: Enterococcal bacteremia on ampicillin IV, fluconazole IV at home, through 3/15 per [facility] admission note. WBC elevated at 25.6.  Best Practices: MC, Lovenox VTE px TPN Access: Double lumen PICC 10/22/15 >> TPN start date: 3/5 (on outpt) >>  Plan:  Clinimix-E 5/15 at 69ml/hr and lipids at 104ml/hr Add daily MVI and Trace elements to TPN CMet, Mg, Phos in AM Sens SSI q4 TPN labs qMon/Thurs  Gracy Bruins, PharmD Clinical Pharmacist [facility]

## 2015-10-22 NOTE — Progress Notes (Signed)
PATIENT DETAILS Name: Ashley Ramsey Age: 60 y.o. Sex: female Date of Birth: March 12, 1956 Admit Date: 10/21/2015 Admitting Physician Norval Morton, MD MY:[id number], Jarold Song, MD  Subjective: No chest pain, no shortness of breath  Assessment/Plan: Principal Problem: Displaced PICC line: Reinserted 3/11, resume ampicillin, fluconazole and TNA.  Active Problems: Hypokalemia: Replete and recheck.  Due to poor oral intake-and no TNA due to dislodged PICC line.  Hypernatremia: Mild-secondary to dehydration. Start IV fluids, resume TNA. Repeat electrolytes.  Invasive laryngeal squamous cell cancer: continue outpatient follow-up with ENT and oncology. Also has a follow-up appointment with oncology/ENT at [facility]. Tracheostomy in place. Patient aware of very poor overall prognoses. Review of prior notes indicate patient and family have been very resistant to hospice/palliative measures.  Enterococcal bacteremia/Candida albicans fungemia: On IV ampicillin and fluconazole via PICC line. Antimicrobial Stop date 3/15.  History of renal mass: Very suspicious for malignancy. Outpatient follow-up with urology and oncology.   Anemia: hemoglobin much improved than previous hemoglobin-suspect this is mostly due to chronic disease.  Leukocytosis: Appears to be chronic-from chronic inflammatory state-due to malignancy in her neck. On IV ampicillin and fluconazole.  Type 2 diabetes: Continue SSI.  GERD: Continue PPI  Disposition: Remain inpatient-home once K better  Antimicrobial agents  See below  Anti-infectives    Start     Dose/Rate Route Frequency Ordered Stop   10/22/15 1800  fluconazole (DIFLUCAN) IVPB 400 mg     400 mg 100 mL/hr over 120 Minutes Intravenous Every 24 hours 10/21/15 1614     10/21/15 1700  ampicillin (OMNIPEN) 2 g in sodium chloride 0.9 % 50 mL IVPB     2 g 150 mL/hr over 20 Minutes Intravenous Every 4 hours 10/21/15 1604     10/21/15 1700   fluconazole (DIFLUCAN) IVPB 400 mg     400 mg 100 mL/hr over 120 Minutes Intravenous  Once 10/21/15 1614 10/21/15 2021      DVT Prophylaxis: Prophylactic Lovenox   Code Status: Full code   Family Communication None at bedisde  Procedures: PICC line 3/11 by IR  CONSULTS:  None  Time spent 25 minutes-Greater than 50% of this time was spent in counseling, explanation of diagnosis, planning of further management, and coordination of care.  MEDICATIONS: Scheduled Meds: . ampicillin (OMNIPEN) IV  2 g Intravenous Q4H  . enoxaparin (LOVENOX) injection  40 mg Subcutaneous QHS  . fluconazole (DIFLUCAN) IV  400 mg Intravenous Q24H  . insulin aspart  0-9 Units Subcutaneous 6 times per day  . lidocaine      . pantoprazole sodium  40 mg Per Tube Q2000  . potassium chloride  10 mEq Intravenous Q1 Hr x 5  . potassium chloride      . potassium chloride  40 mEq Oral Daily   Continuous Infusions: . sodium chloride 50 mL/hr at 10/22/15 1107  . Marland KitchenTPN (CLINIMIX-E) Adult     And  . fat emulsion     PRN Meds:.acetaminophen (TYLENOL) oral liquid 160 mg/5 mL, albuterol, morphine injection, ondansetron **OR** ondansetron (ZOFRAN) IV    PHYSICAL EXAM: Vital signs in last 24 hours: Filed Vitals:   10/22/15 0245 10/22/15 0438 10/22/15 0806 10/22/15 1215  BP: 137/81     Pulse: 106 94    Temp: 98.5 F (36.9 C)     TempSrc: Oral     Resp: 20 20    Height:  Weight:      SpO2: 99% 100% 100% 100%    Weight change:  Filed Weights   10/22/15 0031  Weight: 84.732 kg (186 lb 12.8 oz)   Body mass index is 33.1 kg/(m^2).   Gen Exam: Awake and alert Neck: Tracheostomy in place  Chest: B/L Clear.   CVS: S1 S2 Regular, no murmurs.  Abdomen: soft, BS +, non tender, non distended.  Extremities: no edema, lower extremities warm to touch. Neurologic: Non Focal.   Skin: No Rash.   Wounds: N/A.    Intake/Output from previous day:  Intake/Output Summary (Last 24 hours) at 10/22/15  1445 Last data filed at 10/22/15 0605  Gross per 24 hour  Intake    100 ml  Output    100 ml  Net      0 ml     LAB RESULTS: CBC  Recent Labs Lab 10/16/15 0254 10/16/15 0743 10/16/15 1021 10/16/15 1658 10/17/15 0500 10/21/15 1627  WBC 15.6* 18.1* 24.9* 19.7* 18.6* 25.6*  HGB 7.8* 7.9* 8.2* 7.9* 7.9* 9.5*  HCT 26.1* 26.2* 25.9* 26.2* 26.1* 30.3*  PLT 177 188 198 183 188 321  MCV 88.5 87.9 87.2 87.3 87.3 87.6  MCH 26.4 26.5 27.6 26.3 26.4 27.5  MCHC 29.9* 30.2 31.7 30.2 30.3 31.4  RDW 16.1* 16.1* 16.0* 15.8* 16.0* 16.1*  LYMPHSABS 2.7  --   --   --  2.2 2.6  MONOABS 0.7  --   --   --  0.7 1.3*  EOSABS 0.3  --   --   --  0.3 0.0  BASOSABS 0.0  --   --   --  0.0 0.0    Chemistries   Recent Labs Lab 10/16/15 0254 10/16/15 2100 10/17/15 0500 10/18/15 0735 10/21/15 1627 10/21/15 1630 10/22/15 0808  NA 146* 146* 144  --  144  --  148*  K 2.8* 3.4* 3.0* 3.1* 2.8*  --  2.6*  CL 111 112* 110  --  108  --  112*  CO2 23 21* 24  --  25  --  24  GLUCOSE 299* 219* 262*  --  278*  --  113*  BUN 28* 15 15  --  23*  --  19  CREATININE 1.04* 0.95 0.92  --  0.73  --  0.88  CALCIUM 8.6* 8.6* 8.3*  --  9.1  --  8.8*  MG 1.9  --  1.7 1.9  --  1.7  --     CBG:  Recent Labs Lab 10/18/15 0741 10/18/15 1145 10/21/15 2206 10/22/15 0633 10/22/15 1108  GLUCAP 159* 119* 141* 122* 111*    GFR Estimated Creatinine Clearance: 71 mL/min (by C-G formula based on Cr of 0.88).  Coagulation profile  Recent Labs Lab 10/16/15 0032  INR 1.26    Cardiac Enzymes No results for input(s): CKMB, TROPONINI, MYOGLOBIN in the last 168 hours.  Invalid input(s): CK  Invalid input(s): POCBNP No results for input(s): DDIMER in the last 72 hours. No results for input(s): HGBA1C in the last 72 hours. No results for input(s): CHOL, HDL, LDLCALC, TRIG, CHOLHDL, LDLDIRECT in the last 72 hours. No results for input(s): TSH, T4TOTAL, T3FREE, THYROIDAB in the last 72 hours.  Invalid  input(s): FREET3 No results for input(s): VITAMINB12, FOLATE, FERRITIN, TIBC, IRON, RETICCTPCT in the last 72 hours. No results for input(s): LIPASE, AMYLASE in the last 72 hours.  Urine Studies No results for input(s): UHGB, CRYS in the last 72 hours.  Invalid  input(s): UACOL, UAPR, USPG, UPH, UTP, UGL, UKET, UBIL, UNIT, UROB, ULEU, UEPI, UWBC, URBC, UBAC, CAST, UCOM, BILUA  MICROBIOLOGY: Recent Results (from the past 240 hour(s))  MRSA PCR Screening     Status: None   Collection Time: 10/16/15 12:30 AM  Result Value Ref Range Status   MRSA by PCR NEGATIVE NEGATIVE Final    Comment:        The GeneXpert MRSA Assay (FDA approved for NASAL specimens only), is one component of a comprehensive MRSA colonization surveillance program. It is not intended to diagnose MRSA infection nor to guide or monitor treatment for MRSA infections.     RADIOLOGY STUDIES/RESULTS: Dg Abd 1 View  10/07/2015  CLINICAL DATA:  60 year old female undergoing fluoroscopic guided feeding tube placement. Initial encounter. EXAM: ABDOMEN - 1 VIEW COMPARISON:  10/03/2015. FLUOROSCOPY TIME:  7 minutes 0 seconds FINDINGS: Single fluoroscopic image demonstrating feeding tube placement to the distal duodenum. Contrast injection opacifies the distal duodenum and proximal small bowel. 20 mL of water-soluble contrast Omnipaque 300 was utilized. IMPRESSION: Fluoroscopic guided feeding tube placement with tip at the distal duodenum. Electronically Signed   By: Genevie Ann M.D.   On: 10/07/2015 16:04   Dg Abd 1 View  10/03/2015  CLINICAL DATA:  Feeding tube placement. EXAM: ABDOMEN - 1 VIEW COMPARISON:  None. FINDINGS: A feeding tube is seen in place with the tip overlying the distal duodenum near the ligament of Treitz. This is confirmed by injection of contrast and feeding tube which is seen filling the proximal jejunum. IMPRESSION: Feeding tube tip overlying distal duodenum near the ligament of Treitz. Electronically Signed    By: Earle Gell M.D.   On: 10/03/2015 09:27   Ir Transcath/emboliz  10/18/2015  INDICATION: History of her pharyngeal mass carcinoma. Patient with uncontrollable bleeding from the tracheostomy site, and from the oropharyngeal mass. EXAM: BILATERAL COMMON CAROTID ARTERIOGRAMS AND BILATERAL EXTERNAL CAROTID ARTERIOGRAMS FOLLOWED BY ENDOVASCULAR SUPERSELECTIVE EMBOLIZATION OF THE SUPERIOR THYROIDAL ARTERIES BILATERALLY, AND THE LINGUAL ARTERIES BILATERALLY WITH 200-300 MICRON PVA PARTICLES AND 350-500 MICRON PVA PARTICLES: MEDICATIONS: Ancef 2 g. The antibiotic was administered within 1 hour of the procedure. ANESTHESIA/SEDATION: General anesthesia provided by the Department of Anesthesiology [facility]. CONTRAST:  80 mL OMNIPAQUE IOHEXOL 300 MG/ML  SOLN FLUOROSCOPY TIME:  Fluoroscopy Time: 64.2 minutes seconds. 2409MG y). COMPLICATIONS: None immediate. PROCEDURE: Informed consent was obtained from the patient following explanation of the procedure, risks, benefits and alternatives. The patient understands, agrees and consents for the procedure. All questions were addressed. A time out was performed prior to the initiation of the procedure. Maximal barrier sterile technique utilized including caps, mask, sterile gowns, sterile gloves, large sterile drape, hand hygiene, and Betadine prep. Following a full explanation of the procedure along with the potential associated complications, an informed witnessed consent was obtained from the patient and also her sister. Risks of an ischemic stroke of 1% was reviewed with them. After informed consent, patient was put under general anesthesia by the Department of Anesthesiology at [facility]. The right groin was prepped and draped in the usual sterile fashion. Thereafter using modified Seldinger technique, transfemoral access nto the right common femoral artery was obtained without difficulty. Over a 0.035 inch guidewire a 5 French Pinnacle sheath was  inserted. Through this, and also over a 0.035 inch guidewire a 5 [place] JB 1 catheter was advanced to the aortic arch region and selectively positioned in the right common carotid artery, the right external carotid artery, the left  common carotid artery and the left external carotid artery. The patient tolerated the procedure well. FINDINGS: The right common carotid arteriogram demonstrates the right external carotid artery and its major branches to be widely patent. Abnormal prominence of the lingual artery and also the superior thyroidal artery leading inferiorly to a hypervascular mass was noted. The right internal carotid artery at the bulb demonstrated a focal shallow plaque without associated stenoses or ulcerations. The right internal carotid artery was seen to opacify normally to the cranial skull base. The petrous, cavernous and the supraclinoid segments are widely patent. A right posterior communicating artery is seen to opacify the right posterior cerebral artery distribution. The right middle and the right anterior cerebral arteries are seen to opacify normally into the capillary and venous phases. A few scattered focal areas of caliber irregularity involving the superior division of the right middle cerebral artery and the pericallosal artery on the right suggest intracranial arteriosclerosis. The left common carotid arteriogram demonstrates the left external carotid artery and its major branches to be widely patent. Again abnormal prominence of the superior thyroidal artery, and the lingual artery is noted. The delayed arterial phase demonstrates an exuberant hypervascular blush in the hypopharyngeal region. The left internal carotid artery at the bulb demonstrates a small ulcerative plaque associated with approximately 40% narrowing by the NASCET criteria. More distally the vessel is seen to opacify to the cranial skull base. The petrous, the cavernous and the supraclinoid segments are seen to opacify  normally. Left posterior communicating artery is seen opacifying the left posterior cerebral artery distribution. The left middle and the left anterior cerebral artery are seen to opacify into the capillary and the venous phases. Again demonstrated are small focal areas of mild caliber irregularity involving the left pericallosal and the left middle cerebral artery superior division. ENDOVASCULAR SUPERSELECTIVE EMBOLIZATION OF ABNORMALLY PROMINENT SUPERIOR THYROIDAL, AND THE LINGUAL ARTERIES BILATERALLY USING PVA PARTICLES: The 5 [place] JB 1 catheter in the right common carotid artery was exchanged for a 0.035 inch 300 cm Rosen exchange guidewire for a 6 French 65 cm Arrow neurovascular sheath using biplane roadmap technique and constant fluoroscopic guidance. Good aspiration was obtained from the hub of the neurovascular sheath. Gentle contrast injection demonstrated no evidence of spasms, dissections or of intraluminal filling defects. This was then connected to continuous heparinized saline infusion. Over the [facility] exchange guidewire, a 90 cm 6 [place] MDP, Softip curved guide catheter was then advanced and positioned just proximal to the right common carotid artery bifurcation. The guidewire was removed. Good aspiration was obtained from the 6 [place] guide catheter. This was then gently advanced over a 0.035 inch Roadrunner guidewire using biplane roadmap technique and constant fluoroscopic guidance and positioned just proximal to the origin of the superior thyroidal artery, and the lingual artery. At this time using biplane roadmap technique and constant fluoroscopic guidance, in a coaxial manner and with constant heparinized saline infusion, a rapid transit two tip microcatheter was advanced over a 0.014 inch Softip Synchro micro guidewire to the distal end of the 6 [place] guide catheter in the right external carotid artery. Using a torque device and biplane roadmap technique and constant fluoroscopic guidance,  the micro guidewire was advanced into the right superior thyroidal artery,. After having established safe positioning of the tip of the microcatheter, and established lack of dangerous communications, embolization of this vessel was then performed using PVA particles of sizes 200-300 microns, and 350-500 microns mixed with 75% contrast and 25% heparinized saline infusion. Embolizations  were performed under intermittent fluoroscopy until there was stasis and/or reflux at the tip of the microcatheter. At the end of this, the microcatheter was retrieved more proximally. Control arteriogram performed through the 6 [place] guide catheter demonstrated complete stasis of contrast in the embolized vessel. Similarly, the right lingual artery was then entered using biplane roadmap technique and constant fluoroscopic guidance. Again after having confirmed safe positioning of the tip of the microcatheter, embolization with the PVA particles of sizes 200-300, and 350-500 microns was then performed mixed in with 25% heparinized saline and 75% contrast. An embolization was again performed until there was stasis at the tip of the microcatheter or reflux. The microcatheter was then retrieved slightly proximally. A control arteriogram performed through the microcatheter demonstrated complete angiographic stasis within the embolized vessel. The microcatheter was then gently retrieved and removed. The 6 [place] guide catheter was then gently retrieved into the right common carotid artery. A control arteriogram performed intracranially demonstrated no evidence of vessel occlusions, or of intraluminal filling defects. Attention was now shifted to the left common carotid artery. After having advanced the 6 [place] Arrow sheath inside of which again was the 90 cm 6 [place] MDP curved Softip BriteTip guide catheter, arteriograms were performed of the left common carotid artery bifurcation and intracranially as described above. The 6 [place]  guide catheter catheter was again advanced over a 0.035 inch Roadrunner guidewire using biplane roadmap technique and constant fluoroscopic guidance to just proximal to the origins of the left lingual artery and the left superior thyroidal artery. At this time, using biplane roadmap technique and constant fluoroscopic guidance, in a coaxial manner and with constant heparinized saline infusion, over a 0.014 inch Synchro micro guidewire, access was obtained into the left superior thyroidal artery and left lingual artery as described above. Again after having confirmed safe positioning of the microcatheter tips in the respective vessels eliminating any dangerous communications, embolization was performed with PVA particles of sizes 200-300 microns, and 350-500 microns in each of these vessels until stasis was obtained. Control arteriograms performed after the embolization and in each of these vessels confirmed complete angiographic occlusion with stasis, the microcatheter was retrieved and completed. The 6 French BriteTip guide catheter was then retrieved into the common carotid artery on the left. Arteriograms performed centered intracranially continued to demonstrate no evidence of abnormal filling defects or vessel occlusions. The 6 [place] Arrow neurovascular sheath and the 6 [place] BriteTip guide catheter were then retrieved into the abdominal aorta and exchanged over a J-Tip guidewire for a 6 [place] Pinnacle sheath. This in itself was then replaced with an external closure device. Hemostasis was achieved at the skin entry site. Throughout the procedure, patient's neurologic status and hemodynamic status remained stable. The patient was then transported to the neuro ICU for further management. IMPRESSION: Endovascular superselective embolization of abnormally prominent superior thyroidal arteries bilaterally, and the lingual arteries using PVA particles of sizes 200-300 microns, and 350-500 microns with stasis in  the embolized vessels as described above. Electronically Signed   By: Luanne Bras M.D.   On: 10/17/2015 16:27   Ir Angiogram Follow Up Study  10/18/2015  INDICATION: History of her pharyngeal mass carcinoma. Patient with uncontrollable bleeding from the tracheostomy site, and from the oropharyngeal mass. EXAM: BILATERAL COMMON CAROTID ARTERIOGRAMS AND BILATERAL EXTERNAL CAROTID ARTERIOGRAMS FOLLOWED BY ENDOVASCULAR SUPERSELECTIVE EMBOLIZATION OF THE SUPERIOR THYROIDAL ARTERIES BILATERALLY, AND THE LINGUAL ARTERIES BILATERALLY WITH 200-300 MICRON PVA PARTICLES AND 350-500 MICRON PVA PARTICLES: MEDICATIONS: Ancef 2 g. The  antibiotic was administered within 1 hour of the procedure. ANESTHESIA/SEDATION: General anesthesia provided by the Department of Anesthesiology [facility]. CONTRAST:  80 mL OMNIPAQUE IOHEXOL 300 MG/ML  SOLN FLUOROSCOPY TIME:  Fluoroscopy Time: 64.2 minutes seconds. 2409MG y). COMPLICATIONS: None immediate. PROCEDURE: Informed consent was obtained from the patient following explanation of the procedure, risks, benefits and alternatives. The patient understands, agrees and consents for the procedure. All questions were addressed. A time out was performed prior to the initiation of the procedure. Maximal barrier sterile technique utilized including caps, mask, sterile gowns, sterile gloves, large sterile drape, hand hygiene, and Betadine prep. Following a full explanation of the procedure along with the potential associated complications, an informed witnessed consent was obtained from the patient and also her sister. Risks of an ischemic stroke of 1% was reviewed with them. After informed consent, patient was put under general anesthesia by the Department of Anesthesiology at [facility]. The right groin was prepped and draped in the usual sterile fashion. Thereafter using modified Seldinger technique, transfemoral access nto the right common femoral artery was obtained  without difficulty. Over a 0.035 inch guidewire a 5 French Pinnacle sheath was inserted. Through this, and also over a 0.035 inch guidewire a 5 [place] JB 1 catheter was advanced to the aortic arch region and selectively positioned in the right common carotid artery, the right external carotid artery, the left common carotid artery and the left external carotid artery. The patient tolerated the procedure well. FINDINGS: The right common carotid arteriogram demonstrates the right external carotid artery and its major branches to be widely patent. Abnormal prominence of the lingual artery and also the superior thyroidal artery leading inferiorly to a hypervascular mass was noted. The right internal carotid artery at the bulb demonstrated a focal shallow plaque without associated stenoses or ulcerations. The right internal carotid artery was seen to opacify normally to the cranial skull base. The petrous, cavernous and the supraclinoid segments are widely patent. A right posterior communicating artery is seen to opacify the right posterior cerebral artery distribution. The right middle and the right anterior cerebral arteries are seen to opacify normally into the capillary and venous phases. A few scattered focal areas of caliber irregularity involving the superior division of the right middle cerebral artery and the pericallosal artery on the right suggest intracranial arteriosclerosis. The left common carotid arteriogram demonstrates the left external carotid artery and its major branches to be widely patent. Again abnormal prominence of the superior thyroidal artery, and the lingual artery is noted. The delayed arterial phase demonstrates an exuberant hypervascular blush in the hypopharyngeal region. The left internal carotid artery at the bulb demonstrates a small ulcerative plaque associated with approximately 40% narrowing by the NASCET criteria. More distally the vessel is seen to opacify to the cranial skull  base. The petrous, the cavernous and the supraclinoid segments are seen to opacify normally. Left posterior communicating artery is seen opacifying the left posterior cerebral artery distribution. The left middle and the left anterior cerebral artery are seen to opacify into the capillary and the venous phases. Again demonstrated are small focal areas of mild caliber irregularity involving the left pericallosal and the left middle cerebral artery superior division. ENDOVASCULAR SUPERSELECTIVE EMBOLIZATION OF ABNORMALLY PROMINENT SUPERIOR THYROIDAL, AND THE LINGUAL ARTERIES BILATERALLY USING PVA PARTICLES: The 5 [place] JB 1 catheter in the right common carotid artery was exchanged for a 0.035 inch 300 cm Rosen exchange guidewire for a 6 French 65 cm Arrow neurovascular sheath using  biplane roadmap technique and constant fluoroscopic guidance. Good aspiration was obtained from the hub of the neurovascular sheath. Gentle contrast injection demonstrated no evidence of spasms, dissections or of intraluminal filling defects. This was then connected to continuous heparinized saline infusion. Over the [facility] exchange guidewire, a 90 cm 6 [place] MDP, Softip curved guide catheter was then advanced and positioned just proximal to the right common carotid artery bifurcation. The guidewire was removed. Good aspiration was obtained from the 6 [place] guide catheter. This was then gently advanced over a 0.035 inch Roadrunner guidewire using biplane roadmap technique and constant fluoroscopic guidance and positioned just proximal to the origin of the superior thyroidal artery, and the lingual artery. At this time using biplane roadmap technique and constant fluoroscopic guidance, in a coaxial manner and with constant heparinized saline infusion, a rapid transit two tip microcatheter was advanced over a 0.014 inch Softip Synchro micro guidewire to the distal end of the 6 [place] guide catheter in the right external carotid artery.  Using a torque device and biplane roadmap technique and constant fluoroscopic guidance, the micro guidewire was advanced into the right superior thyroidal artery,. After having established safe positioning of the tip of the microcatheter, and established lack of dangerous communications, embolization of this vessel was then performed using PVA particles of sizes 200-300 microns, and 350-500 microns mixed with 75% contrast and 25% heparinized saline infusion. Embolizations were performed under intermittent fluoroscopy until there was stasis and/or reflux at the tip of the microcatheter. At the end of this, the microcatheter was retrieved more proximally. Control arteriogram performed through the 6 [place] guide catheter demonstrated complete stasis of contrast in the embolized vessel. Similarly, the right lingual artery was then entered using biplane roadmap technique and constant fluoroscopic guidance. Again after having confirmed safe positioning of the tip of the microcatheter, embolization with the PVA particles of sizes 200-300, and 350-500 microns was then performed mixed in with 25% heparinized saline and 75% contrast. An embolization was again performed until there was stasis at the tip of the microcatheter or reflux. The microcatheter was then retrieved slightly proximally. A control arteriogram performed through the microcatheter demonstrated complete angiographic stasis within the embolized vessel. The microcatheter was then gently retrieved and removed. The 6 [place] guide catheter was then gently retrieved into the right common carotid artery. A control arteriogram performed intracranially demonstrated no evidence of vessel occlusions, or of intraluminal filling defects. Attention was now shifted to the left common carotid artery. After having advanced the 6 [place] Arrow sheath inside of which again was the 90 cm 6 [place] MDP curved Softip BriteTip guide catheter, arteriograms were performed of the left  common carotid artery bifurcation and intracranially as described above. The 6 [place] guide catheter catheter was again advanced over a 0.035 inch Roadrunner guidewire using biplane roadmap technique and constant fluoroscopic guidance to just proximal to the origins of the left lingual artery and the left superior thyroidal artery. At this time, using biplane roadmap technique and constant fluoroscopic guidance, in a coaxial manner and with constant heparinized saline infusion, over a 0.014 inch Synchro micro guidewire, access was obtained into the left superior thyroidal artery and left lingual artery as described above. Again after having confirmed safe positioning of the microcatheter tips in the respective vessels eliminating any dangerous communications, embolization was performed with PVA particles of sizes 200-300 microns, and 350-500 microns in each of these vessels until stasis was obtained. Control arteriograms performed after the embolization and in each of these  vessels confirmed complete angiographic occlusion with stasis, the microcatheter was retrieved and completed. The 6 French BriteTip guide catheter was then retrieved into the common carotid artery on the left. Arteriograms performed centered intracranially continued to demonstrate no evidence of abnormal filling defects or vessel occlusions. The 6 [place] Arrow neurovascular sheath and the 6 [place] BriteTip guide catheter were then retrieved into the abdominal aorta and exchanged over a J-Tip guidewire for a 6 [place] Pinnacle sheath. This in itself was then replaced with an external closure device. Hemostasis was achieved at the skin entry site. Throughout the procedure, patient's neurologic status and hemodynamic status remained stable. The patient was then transported to the neuro ICU for further management. IMPRESSION: Endovascular superselective embolization of abnormally prominent superior thyroidal arteries bilaterally, and the lingual  arteries using PVA particles of sizes 200-300 microns, and 350-500 microns with stasis in the embolized vessels as described above. Electronically Signed   By: Luanne Bras M.D.   On: 10/17/2015 16:27   Ir US Guide Vasc Access Right  10/22/2015  INDICATION: 60 year old female requiring outpatient IV antibiotics and anti fungal medications. Her PICC was recently displaced. She requires placement of a new peripheral line. EXAM: PICC LINE PLACEMENT WITH ULTRASOUND AND FLUOROSCOPIC GUIDANCE MEDICATIONS: None required ANESTHESIA/SEDATION: None required FLUOROSCOPY TIME:  Fluoroscopy Time: 0 minutes 36 seconds (2 mGy). COMPLICATIONS: None immediate. PROCEDURE: The patient was advised of the possible risks and complications and agreed to undergo the procedure. The patient was then brought to the angiographic suite for the procedure. The right arm was prepped with chlorhexidine, draped in the usual sterile fashion using maximum barrier technique (cap and mask, sterile gown, sterile gloves, large sterile sheet, hand hygiene and cutaneous antisepsis) and infiltrated locally with 1% Lidocaine. Ultrasound demonstrated patency of the right brachial vein, and this was documented with an image. Under real-time ultrasound guidance, this vein was accessed with a 21 gauge micropuncture needle and image documentation was performed. A 0.018 wire was introduced in to the vein. Over this, a 5 [place] single lumen power injectable PICC was advanced to the lower SVC/right atrial junction. Fluoroscopy during the procedure and fluoro spot radiograph confirms appropriate catheter position. The catheter was flushed and covered with a sterile dressing. Catheter length: 39 cm IMPRESSION: Successful right arm power PICC line placement with ultrasound and fluoroscopic guidance. The catheter is ready for use. Signed, Criselda Peaches, MD [facility] Radiology Electronically Signed   By:  Jacqulynn Cadet M.D.   On: 10/22/2015 14:02   Portable Chest Xray  10/17/2015  CLINICAL DATA:  10/16/2015. EXAM: PORTABLE CHEST 1 VIEW COMPARISON:  None. FINDINGS: Tracheostomy tube in stable position. Right PICC line tip remains in the right atrium. Low lung volumes with basilar atelectasis. No focal pulmonary infiltrate. No pleural effusion or pneumothorax. Heart size normal. No acute osseous abnormality. IMPRESSION: 1. Tracheostomy tube in stable position. Right PICC line tip remains in the right atrium. Again retraction of approximately 4-5 cm should be considered. 2. Lung volumes with mild bibasilar atelectasis. Electronically Signed   By: Marcello Moores  Register   On: 10/17/2015 07:11   Dg Chest Port 1 View  10/16/2015  CLINICAL DATA:  Evaluate PICC line placement EXAM: PORTABLE CHEST 1 VIEW COMPARISON:  10/04/2015 FINDINGS: There is a right PICC line identified. The tip extends about 5 cm beyond the cavoatrial junction into the right atrium. There is no pneumothorax. Tracheostomy tube again identified. Mild atelectatic change at both lung bases with low lung  volumes. IMPRESSION: Right PICC line as described with tip well into the right atrium. Suggest retraction by about 4-5 cm and repeat radiograph. Electronically Signed   By: Skipper Cliche M.D.   On: 10/16/2015 13:39   Dg Chest Port 1 View  10/04/2015  CLINICAL DATA:  Evaluate central line placement EXAM: PORTABLE CHEST 1 VIEW COMPARISON:  09/25/2015 FINDINGS: Right upper extremity PICC. Tip placement is indeterminate due to underpenetration and artifact from EKG leads. The tip is the level of the right atrium, depth uncertain. Would retracted by 3-4 cm. New feeding tube with tip at least reaching the stomach. Tracheostomy tube is well seated. Improved inflation compared to prior. No pneumonia or edema. IMPRESSION: Right upper extremity PICC with tip at the right atrium, depth uncertain due to technical factors. Suggest retraction by 3-4 cm and repeat  radiograph. Electronically Signed   By: Monte Fantasia M.D.   On: 10/04/2015 14:47   Dg Chest Port 1 View  09/25/2015  CLINICAL DATA:  60 year old with recent diagnosis of laryngeal cancer with indwelling tracheostomy, presenting 2 days ago with sepsis. Worsening hypoxia acutely today. EXAM: PORTABLE CHEST 1 VIEW COMPARISON:  09/23/2015 dating back to 08/29/2015. FINDINGS: Tracheostomy tube in satisfactory position below the thoracic inlet. Markedly suboptimal inspiration with atelectasis in the lung bases. Cardiac silhouette mildly enlarged even allowing for technique and degree of inspiration. Pulmonary venous hypertension and minimal to mild interstitial pulmonary edema, new since 2 days ago. Streaky and patchy opacity at the left lung base. No confluent consolidation elsewhere. Note is made of slight inferior subluxation of the left humeral head relative to the glenoid, not seen on prior examinations, though moderate to severe degenerative changes are present in the left glenohumeral joint. IMPRESSION: 1. Markedly suboptimal inspiration with bibasilar atelectasis. Possible developing bronchopneumonia at the left lung base. 2. Stable cardiomegaly. Pulmonary venous hypertension with minimal to mild interstitial pulmonary edema, query fluid overload. 3. Slight inferior subluxation of the left glenohumeral joint, associated with moderate to severe degenerative changes. Electronically Signed   By: Evangeline Dakin M.D.   On: 09/25/2015 18:12   Dg Chest Port 1 View  09/23/2015  CLINICAL DATA:  60 year old female with shortness of breath and cough EXAM: PORTABLE CHEST 1 VIEW COMPARISON:  Radiograph dated 09/14/2015 FINDINGS: Right-sided PICC in stable positioning. Tracheostomy with tip above the carina in stable positioning. Single-view of the chest demonstrate a focal area of hazy density at the left lung base, likely atelectatic changes. Pneumonia is less likely but not excluded. Clinical correlation is  recommended. The lungs are hypovolemic. No pleural effusion or pneumothorax. Stable cardiac silhouette. No acute osseous pathology. IMPRESSION: Minimal left lung base atelectatic changes versus less likely pneumonia. Clinical correlation recommended. Electronically Signed   By: Anner Crete M.D.   On: 09/23/2015 18:30   Dg Naso G Tube Plc W/fl-no Rad  10/07/2015  CLINICAL DATA:  NASO G TUBE PLACEMENT WITH FLUORO Fluoroscopy was utilized by the requesting physician.  No radiographic interpretation.   Dg Naso G Tube Plc W/fl-no Rad  10/03/2015  CLINICAL DATA:  NASO G TUBE PLACEMENT WITH FLUORO Fluoroscopy was utilized by the requesting physician.  No radiographic interpretation.   Dg Swallowing Func-speech Pathology  10/01/2015  Objective Swallowing Evaluation: Type of Study: MBS-Modified Barium Swallow Study Patient Details Name: Ashley Ramsey MRN: [id number] Date of Birth: [birth year] Today's Date: 10/01/2015 Time: SLP Start Time (ACUTE ONLY): 1021-SLP Stop Time (ACUTE ONLY): 1050 SLP Time Calculation (min) (ACUTE ONLY): 29 min Past Medical  History: Past Medical History Diagnosis Date . Thyroid disease  . Hypertension  . Neck mass hospitalized 08/29/2015 . Type II diabetes mellitus ([place])  . Left kidney mass  . Anemia, chronic disease  Past Surgical History: Past Surgical History Procedure Laterality Date . No past surgeries   . Esophagogastroduodenoscopy N/A 09/04/2015   Procedure: ESOPHAGOGASTRODUODENOSCOPY (EGD);  Surgeon: Manus Gunning, MD;  Location: [place];  Service: Gastroenterology;  Laterality: N/A; . Direct laryngoscopy N/A 09/02/2015   Procedure: DIRECT LARYNGOSCOPY WITH BIOPSY;  Surgeon: Izora Gala, MD;  Location: [place];  Service: ENT;  Laterality: N/A; . Esophagoscopy N/A 09/02/2015   Procedure: ESOPHAGOSCOPY;  Surgeon: Izora Gala, MD;  Location: [place];  Service: ENT;  Laterality: N/A; . Tracheostomy tube placement N/A 09/04/2015   Procedure: TRACHEOSTOMY;  Surgeon: Izora Gala, MD;   Location: [place];  Service: ENT;  Laterality: N/A; . Direct laryngoscopy  09/04/2015   Procedure: DIRECT View LARYNGOSCOPY with cautery of of tumor;  Surgeon: Izora Gala, MD;  Location: [place];  Service: ENT;; HPI: 60 y.o. female with h/o laryngeal cancer, s/p trach, type 2 DM, who presented to ED due to confusion. Pt problem list includes: severe sepsis possibly due to LLL HCAP, infection in PICC line. Last seen by SLP 1/31 for PMSV tx. Most recent MBSS 1/26 recommended NPO status except for free water protocol under strict guidelines. Pt remained NPO at d/c last month, with nutrition via TPN. Pt reported PMSV use at home as well as PO intake. Subjective: pt alert, eager for food/drink Assessment / Plan / Recommendation CHL IP CLINICAL IMPRESSIONS 10/01/2015 Therapy Diagnosis Moderate pharyngeal phase dysphagia;Severe pharyngeal phase dysphagia Clinical Impression MBS complete. Results generally consistent with most recent MBS results 09/08/15 with potentially slightly greater dysfunction. She continues to exhibit a moderate-severe anatomically based pharyngeal dysphagia resulting in sensory and motor deficits. Primary deficit appears to be decreased laryngeal closure, likely related to laryngeal mass,  and pharyngeal residuals resulting in poor airway protection both during and post swallow with all consistencies assessed. Cough present but weak and unsuccessful to clear the airway further increasing risk of an aspiration related infection. Rehabilitation of swallow function prior to intervention for tumor guarded due to current size/location. Long term non-oral means of nutrition appropriate at this time. Recommend NPO except for free water protocol under strict guidelines. Pending plan for intervention for tumor, would recommend continued SLP services for maintenance of musculature and potential for a po diet in the future.  Impact on safety and function Severe aspiration risk;Risk for inadequate  nutrition/hydration   CHL IP TREATMENT RECOMMENDATION 09/08/2015 Treatment Recommendations Therapy as outlined in treatment plan below   Prognosis 10/01/2015 Prognosis for Safe Diet Advancement Guarded Barriers to Reach Goals Severity of deficits Barriers/Prognosis Comment -- CHL IP DIET RECOMMENDATION 10/01/2015 SLP Diet Recommendations NPO;Ice chips PRN after oral care;Free water protocol after oral care;Alternative means - long-term Liquid Administration via -- Medication Administration Via alternative means Compensations -- Postural Changes --   CHL IP OTHER RECOMMENDATIONS 10/01/2015 Recommended Consults -- Oral Care Recommendations Oral care QID Other Recommendations --   CHL IP FOLLOW UP RECOMMENDATIONS 10/01/2015 Follow up Recommendations Outpatient SLP   CHL IP FREQUENCY AND DURATION 10/01/2015 Speech Therapy Frequency (ACUTE ONLY) min 2x/week Treatment Duration 2 weeks      CHL IP ORAL PHASE 10/01/2015 Oral Phase WFL Oral - Pudding Teaspoon -- Oral - Pudding Cup -- Oral - Honey Teaspoon -- Oral - Honey Cup -- Oral - Nectar Teaspoon -- Oral -  Nectar Cup -- Oral - Nectar Straw -- Oral - Thin Teaspoon -- Oral - Thin Cup -- Oral - Thin Straw -- Oral - Puree -- Oral - Mech Soft -- Oral - Regular -- Oral - Multi-Consistency -- Oral - Pill -- Oral Phase - Comment --  CHL IP PHARYNGEAL PHASE 10/01/2015 Pharyngeal Phase Impaired Pharyngeal- Pudding Teaspoon NT Pharyngeal -- Pharyngeal- Pudding Cup -- Pharyngeal -- Pharyngeal- Honey Teaspoon NT Pharyngeal -- Pharyngeal- Honey Cup NT Pharyngeal -- Pharyngeal- Nectar Teaspoon Reduced airway/laryngeal closure;Reduced epiglottic inversion;Penetration/Aspiration during swallow;Penetration/Apiration after swallow;Pharyngeal residue - valleculae;Pharyngeal residue - pyriform Pharyngeal Material enters airway, passes BELOW cords and not ejected out despite cough attempt by patient Pharyngeal- Nectar Cup -- Pharyngeal -- Pharyngeal- Nectar Straw -- Pharyngeal -- Pharyngeal- Thin  Teaspoon Reduced airway/laryngeal closure;Reduced epiglottic inversion;Penetration/Aspiration during swallow;Penetration/Apiration after swallow;Pharyngeal residue - valleculae;Pharyngeal residue - pyriform Pharyngeal Material enters airway, passes BELOW cords and not ejected out despite cough attempt by patient Pharyngeal- Thin Cup -- Pharyngeal -- Pharyngeal- Thin Straw NT Pharyngeal -- Pharyngeal- Puree Reduced airway/laryngeal closure;Reduced epiglottic inversion;Penetration/Apiration after swallow;Pharyngeal residue - valleculae;Pharyngeal residue - pyriform Pharyngeal -- Pharyngeal- Mechanical Soft -- Pharyngeal -- Pharyngeal- Regular -- Pharyngeal -- Pharyngeal- Multi-consistency -- Pharyngeal -- Pharyngeal- Pill -- Pharyngeal -- Pharyngeal Comment --  CHL IP CERVICAL ESOPHAGEAL PHASE 10/01/2015 Cervical Esophageal Phase (No Data) Pudding Teaspoon -- Pudding Cup -- Honey Teaspoon -- Honey Cup -- Nectar Teaspoon -- Nectar Cup -- Nectar Straw -- Thin Teaspoon -- Thin Cup -- Thin Straw -- Puree -- Mechanical Soft -- Regular -- Multi-consistency -- Pill -- Cervical Esophageal Comment -- No flowsheet data found. Gabriel Rainwater MA, CCC-SLP [phone] [place] Leah Meryl 10/01/2015, 11:44 AM              Ir Cyndy Freeze Guide Cv Midline Picc Right  10/22/2015  INDICATION: 60 year old female requiring outpatient IV antibiotics and anti fungal medications. Her PICC was recently displaced. She requires placement of a new peripheral line. EXAM: PICC LINE PLACEMENT WITH ULTRASOUND AND FLUOROSCOPIC GUIDANCE MEDICATIONS: None required ANESTHESIA/SEDATION: None required FLUOROSCOPY TIME:  Fluoroscopy Time: 0 minutes 36 seconds (2 mGy). COMPLICATIONS: None immediate. PROCEDURE: The patient was advised of the possible risks and complications and agreed to undergo the procedure. The patient was then brought to the angiographic suite for the procedure. The right arm was prepped with chlorhexidine, draped in the usual sterile fashion  using maximum barrier technique (cap and mask, sterile gown, sterile gloves, large sterile sheet, hand hygiene and cutaneous antisepsis) and infiltrated locally with 1% Lidocaine. Ultrasound demonstrated patency of the right brachial vein, and this was documented with an image. Under real-time ultrasound guidance, this vein was accessed with a 21 gauge micropuncture needle and image documentation was performed. A 0.018 wire was introduced in to the vein. Over this, a 5 [place] single lumen power injectable PICC was advanced to the lower SVC/right atrial junction. Fluoroscopy during the procedure and fluoro spot radiograph confirms appropriate catheter position. The catheter was flushed and covered with a sterile dressing. Catheter length: 39 cm IMPRESSION: Successful right arm power PICC line placement with ultrasound and fluoroscopic guidance. The catheter is ready for use. Signed, Criselda Peaches, MD [facility] Radiology Electronically Signed   By: Jacqulynn Cadet M.D.   On: 10/22/2015 14:02   Ir Angio Intra Extracran Sel Com Carotid Innominate Bilat Mod Sed  10/18/2015  INDICATION: History of her pharyngeal mass carcinoma. Patient with uncontrollable bleeding from the tracheostomy site, and from the oropharyngeal mass.  EXAM: BILATERAL COMMON CAROTID ARTERIOGRAMS AND BILATERAL EXTERNAL CAROTID ARTERIOGRAMS FOLLOWED BY ENDOVASCULAR SUPERSELECTIVE EMBOLIZATION OF THE SUPERIOR THYROIDAL ARTERIES BILATERALLY, AND THE LINGUAL ARTERIES BILATERALLY WITH 200-300 MICRON PVA PARTICLES AND 350-500 MICRON PVA PARTICLES: MEDICATIONS: Ancef 2 g. The antibiotic was administered within 1 hour of the procedure. ANESTHESIA/SEDATION: General anesthesia provided by the Department of Anesthesiology [facility]. CONTRAST:  80 mL OMNIPAQUE IOHEXOL 300 MG/ML  SOLN FLUOROSCOPY TIME:  Fluoroscopy Time: 64.2 minutes seconds. 2409MG y). COMPLICATIONS: None immediate. PROCEDURE:  Informed consent was obtained from the patient following explanation of the procedure, risks, benefits and alternatives. The patient understands, agrees and consents for the procedure. All questions were addressed. A time out was performed prior to the initiation of the procedure. Maximal barrier sterile technique utilized including caps, mask, sterile gowns, sterile gloves, large sterile drape, hand hygiene, and Betadine prep. Following a full explanation of the procedure along with the potential associated complications, an informed witnessed consent was obtained from the patient and also her sister. Risks of an ischemic stroke of 1% was reviewed with them. After informed consent, patient was put under general anesthesia by the Department of Anesthesiology at [facility]. The right groin was prepped and draped in the usual sterile fashion. Thereafter using modified Seldinger technique, transfemoral access nto the right common femoral artery was obtained without difficulty. Over a 0.035 inch guidewire a 5 French Pinnacle sheath was inserted. Through this, and also over a 0.035 inch guidewire a 5 [place] JB 1 catheter was advanced to the aortic arch region and selectively positioned in the right common carotid artery, the right external carotid artery, the left common carotid artery and the left external carotid artery. The patient tolerated the procedure well. FINDINGS: The right common carotid arteriogram demonstrates the right external carotid artery and its major branches to be widely patent. Abnormal prominence of the lingual artery and also the superior thyroidal artery leading inferiorly to a hypervascular mass was noted. The right internal carotid artery at the bulb demonstrated a focal shallow plaque without associated stenoses or ulcerations. The right internal carotid artery was seen to opacify normally to the cranial skull base. The petrous, cavernous and the supraclinoid segments are widely  patent. A right posterior communicating artery is seen to opacify the right posterior cerebral artery distribution. The right middle and the right anterior cerebral arteries are seen to opacify normally into the capillary and venous phases. A few scattered focal areas of caliber irregularity involving the superior division of the right middle cerebral artery and the pericallosal artery on the right suggest intracranial arteriosclerosis. The left common carotid arteriogram demonstrates the left external carotid artery and its major branches to be widely patent. Again abnormal prominence of the superior thyroidal artery, and the lingual artery is noted. The delayed arterial phase demonstrates an exuberant hypervascular blush in the hypopharyngeal region. The left internal carotid artery at the bulb demonstrates a small ulcerative plaque associated with approximately 40% narrowing by the NASCET criteria. More distally the vessel is seen to opacify to the cranial skull base. The petrous, the cavernous and the supraclinoid segments are seen to opacify normally. Left posterior communicating artery is seen opacifying the left posterior cerebral artery distribution. The left middle and the left anterior cerebral artery are seen to opacify into the capillary and the venous phases. Again demonstrated are small focal areas of mild caliber irregularity involving the left pericallosal and the left middle cerebral artery superior division. ENDOVASCULAR SUPERSELECTIVE EMBOLIZATION OF ABNORMALLY PROMINENT SUPERIOR THYROIDAL,  AND THE LINGUAL ARTERIES BILATERALLY USING PVA PARTICLES: The 5 [place] JB 1 catheter in the right common carotid artery was exchanged for a 0.035 inch 300 cm Rosen exchange guidewire for a 6 French 65 cm Arrow neurovascular sheath using biplane roadmap technique and constant fluoroscopic guidance. Good aspiration was obtained from the hub of the neurovascular sheath. Gentle contrast injection demonstrated no  evidence of spasms, dissections or of intraluminal filling defects. This was then connected to continuous heparinized saline infusion. Over the [facility] exchange guidewire, a 90 cm 6 [place] MDP, Softip curved guide catheter was then advanced and positioned just proximal to the right common carotid artery bifurcation. The guidewire was removed. Good aspiration was obtained from the 6 [place] guide catheter. This was then gently advanced over a 0.035 inch Roadrunner guidewire using biplane roadmap technique and constant fluoroscopic guidance and positioned just proximal to the origin of the superior thyroidal artery, and the lingual artery. At this time using biplane roadmap technique and constant fluoroscopic guidance, in a coaxial manner and with constant heparinized saline infusion, a rapid transit two tip microcatheter was advanced over a 0.014 inch Softip Synchro micro guidewire to the distal end of the 6 [place] guide catheter in the right external carotid artery. Using a torque device and biplane roadmap technique and constant fluoroscopic guidance, the micro guidewire was advanced into the right superior thyroidal artery,. After having established safe positioning of the tip of the microcatheter, and established lack of dangerous communications, embolization of this vessel was then performed using PVA particles of sizes 200-300 microns, and 350-500 microns mixed with 75% contrast and 25% heparinized saline infusion. Embolizations were performed under intermittent fluoroscopy until there was stasis and/or reflux at the tip of the microcatheter. At the end of this, the microcatheter was retrieved more proximally. Control arteriogram performed through the 6 [place] guide catheter demonstrated complete stasis of contrast in the embolized vessel. Similarly, the right lingual artery was then entered using biplane roadmap technique and constant fluoroscopic guidance. Again after having confirmed safe positioning of the tip  of the microcatheter, embolization with the PVA particles of sizes 200-300, and 350-500 microns was then performed mixed in with 25% heparinized saline and 75% contrast. An embolization was again performed until there was stasis at the tip of the microcatheter or reflux. The microcatheter was then retrieved slightly proximally. A control arteriogram performed through the microcatheter demonstrated complete angiographic stasis within the embolized vessel. The microcatheter was then gently retrieved and removed. The 6 [place] guide catheter was then gently retrieved into the right common carotid artery. A control arteriogram performed intracranially demonstrated no evidence of vessel occlusions, or of intraluminal filling defects. Attention was now shifted to the left common carotid artery. After having advanced the 6 [place] Arrow sheath inside of which again was the 90 cm 6 [place] MDP curved Softip BriteTip guide catheter, arteriograms were performed of the left common carotid artery bifurcation and intracranially as described above. The 6 [place] guide catheter catheter was again advanced over a 0.035 inch Roadrunner guidewire using biplane roadmap technique and constant fluoroscopic guidance to just proximal to the origins of the left lingual artery and the left superior thyroidal artery. At this time, using biplane roadmap technique and constant fluoroscopic guidance, in a coaxial manner and with constant heparinized saline infusion, over a 0.014 inch Synchro micro guidewire, access was obtained into the left superior thyroidal artery and left lingual artery as described above. Again after having confirmed safe positioning of the microcatheter tips  in the respective vessels eliminating any dangerous communications, embolization was performed with PVA particles of sizes 200-300 microns, and 350-500 microns in each of these vessels until stasis was obtained. Control arteriograms performed after the embolization and in  each of these vessels confirmed complete angiographic occlusion with stasis, the microcatheter was retrieved and completed. The 6 [place] BriteTip guide catheter was then retrieved into the common carotid artery on the left. Arteriograms performed centered intracranially continued to demonstrate no evidence of abnormal filling defects or vessel occlusions. The 6 [place] Arrow neurovascular sheath and the 6 [place] BriteTip guide catheter were then retrieved into the abdominal aorta and exchanged over a J-Tip guidewire for a 6 [place] Pinnacle sheath. This in itself was then replaced with an external closure device. Hemostasis was achieved at the skin entry site. Throughout the procedure, patient's neurologic status and hemodynamic status remained stable. The patient was then transported to the neuro ICU for further management. IMPRESSION: Endovascular superselective embolization of abnormally prominent superior thyroidal arteries bilaterally, and the lingual arteries using PVA particles of sizes 200-300 microns, and 350-500 microns with stasis in the embolized vessels as described above. Electronically Signed   By: Luanne Bras M.D.   On: 10/17/2015 16:27   Ir Angio External Carotid Sel Ext Carotid Bilat Mod Sed  10/18/2015  INDICATION: History of her pharyngeal mass carcinoma. Patient with uncontrollable bleeding from the tracheostomy site, and from the oropharyngeal mass. EXAM: BILATERAL COMMON CAROTID ARTERIOGRAMS AND BILATERAL EXTERNAL CAROTID ARTERIOGRAMS FOLLOWED BY ENDOVASCULAR SUPERSELECTIVE EMBOLIZATION OF THE SUPERIOR THYROIDAL ARTERIES BILATERALLY, AND THE LINGUAL ARTERIES BILATERALLY WITH 200-300 MICRON PVA PARTICLES AND 350-500 MICRON PVA PARTICLES: MEDICATIONS: Ancef 2 g. The antibiotic was administered within 1 hour of the procedure. ANESTHESIA/SEDATION: General anesthesia provided by the Department of Anesthesiology [facility]. CONTRAST:  80 mL OMNIPAQUE IOHEXOL 300 MG/ML  SOLN  FLUOROSCOPY TIME:  Fluoroscopy Time: 64.2 minutes seconds. 2409MG y). COMPLICATIONS: None immediate. PROCEDURE: Informed consent was obtained from the patient following explanation of the procedure, risks, benefits and alternatives. The patient understands, agrees and consents for the procedure. All questions were addressed. A time out was performed prior to the initiation of the procedure. Maximal barrier sterile technique utilized including caps, mask, sterile gowns, sterile gloves, large sterile drape, hand hygiene, and Betadine prep. Following a full explanation of the procedure along with the potential associated complications, an informed witnessed consent was obtained from the patient and also her sister. Risks of an ischemic stroke of 1% was reviewed with them. After informed consent, patient was put under general anesthesia by the Department of Anesthesiology at [facility]. The right groin was prepped and draped in the usual sterile fashion. Thereafter using modified Seldinger technique, transfemoral access nto the right common femoral artery was obtained without difficulty. Over a 0.035 inch guidewire a 5 French Pinnacle sheath was inserted. Through this, and also over a 0.035 inch guidewire a 5 [place] JB 1 catheter was advanced to the aortic arch region and selectively positioned in the right common carotid artery, the right external carotid artery, the left common carotid artery and the left external carotid artery. The patient tolerated the procedure well. FINDINGS: The right common carotid arteriogram demonstrates the right external carotid artery and its major branches to be widely patent. Abnormal prominence of the lingual artery and also the superior thyroidal artery leading inferiorly to a hypervascular mass was noted. The right internal carotid artery at the bulb demonstrated a focal shallow plaque without associated stenoses or ulcerations. The  right internal carotid artery was seen to  opacify normally to the cranial skull base. The petrous, cavernous and the supraclinoid segments are widely patent. A right posterior communicating artery is seen to opacify the right posterior cerebral artery distribution. The right middle and the right anterior cerebral arteries are seen to opacify normally into the capillary and venous phases. A few scattered focal areas of caliber irregularity involving the superior division of the right middle cerebral artery and the pericallosal artery on the right suggest intracranial arteriosclerosis. The left common carotid arteriogram demonstrates the left external carotid artery and its major branches to be widely patent. Again abnormal prominence of the superior thyroidal artery, and the lingual artery is noted. The delayed arterial phase demonstrates an exuberant hypervascular blush in the hypopharyngeal region. The left internal carotid artery at the bulb demonstrates a small ulcerative plaque associated with approximately 40% narrowing by the NASCET criteria. More distally the vessel is seen to opacify to the cranial skull base. The petrous, the cavernous and the supraclinoid segments are seen to opacify normally. Left posterior communicating artery is seen opacifying the left posterior cerebral artery distribution. The left middle and the left anterior cerebral artery are seen to opacify into the capillary and the venous phases. Again demonstrated are small focal areas of mild caliber irregularity involving the left pericallosal and the left middle cerebral artery superior division. ENDOVASCULAR SUPERSELECTIVE EMBOLIZATION OF ABNORMALLY PROMINENT SUPERIOR THYROIDAL, AND THE LINGUAL ARTERIES BILATERALLY USING PVA PARTICLES: The 5 [place] JB 1 catheter in the right common carotid artery was exchanged for a 0.035 inch 300 cm Rosen exchange guidewire for a 6 [place] 65 cm Arrow neurovascular sheath using biplane roadmap technique and constant fluoroscopic guidance. Good  aspiration was obtained from the hub of the neurovascular sheath. Gentle contrast injection demonstrated no evidence of spasms, dissections or of intraluminal filling defects. This was then connected to continuous heparinized saline infusion. Over the [facility] exchange guidewire, a 90 cm 6 [place] MDP, Softip curved guide catheter was then advanced and positioned just proximal to the right common carotid artery bifurcation. The guidewire was removed. Good aspiration was obtained from the 6 [place] guide catheter. This was then gently advanced over a 0.035 inch Roadrunner guidewire using biplane roadmap technique and constant fluoroscopic guidance and positioned just proximal to the origin of the superior thyroidal artery, and the lingual artery. At this time using biplane roadmap technique and constant fluoroscopic guidance, in a coaxial manner and with constant heparinized saline infusion, a rapid transit two tip microcatheter was advanced over a 0.014 inch Softip Synchro micro guidewire to the distal end of the 6 [place] guide catheter in the right external carotid artery. Using a torque device and biplane roadmap technique and constant fluoroscopic guidance, the micro guidewire was advanced into the right superior thyroidal artery,. After having established safe positioning of the tip of the microcatheter, and established lack of dangerous communications, embolization of this vessel was then performed using PVA particles of sizes 200-300 microns, and 350-500 microns mixed with 75% contrast and 25% heparinized saline infusion. Embolizations were performed under intermittent fluoroscopy until there was stasis and/or reflux at the tip of the microcatheter. At the end of this, the microcatheter was retrieved more proximally. Control arteriogram performed through the 6 [place] guide catheter demonstrated complete stasis of contrast in the embolized vessel. Similarly, the right lingual artery was then entered using biplane  roadmap technique and constant fluoroscopic guidance. Again after having confirmed safe positioning of the tip of the  microcatheter, embolization with the PVA particles of sizes 200-300, and 350-500 microns was then performed mixed in with 25% heparinized saline and 75% contrast. An embolization was again performed until there was stasis at the tip of the microcatheter or reflux. The microcatheter was then retrieved slightly proximally. A control arteriogram performed through the microcatheter demonstrated complete angiographic stasis within the embolized vessel. The microcatheter was then gently retrieved and removed. The 6 [place] guide catheter was then gently retrieved into the right common carotid artery. A control arteriogram performed intracranially demonstrated no evidence of vessel occlusions, or of intraluminal filling defects. Attention was now shifted to the left common carotid artery. After having advanced the 6 [place] Arrow sheath inside of which again was the 90 cm 6 [place] MDP curved Softip BriteTip guide catheter, arteriograms were performed of the left common carotid artery bifurcation and intracranially as described above. The 6 [place] guide catheter catheter was again advanced over a 0.035 inch Roadrunner guidewire using biplane roadmap technique and constant fluoroscopic guidance to just proximal to the origins of the left lingual artery and the left superior thyroidal artery. At this time, using biplane roadmap technique and constant fluoroscopic guidance, in a coaxial manner and with constant heparinized saline infusion, over a 0.014 inch Synchro micro guidewire, access was obtained into the left superior thyroidal artery and left lingual artery as described above. Again after having confirmed safe positioning of the microcatheter tips in the respective vessels eliminating any dangerous communications, embolization was performed with PVA particles of sizes 200-300 microns, and 350-500 microns  in each of these vessels until stasis was obtained. Control arteriograms performed after the embolization and in each of these vessels confirmed complete angiographic occlusion with stasis, the microcatheter was retrieved and completed. The 6 [place] BriteTip guide catheter was then retrieved into the common carotid artery on the left. Arteriograms performed centered intracranially continued to demonstrate no evidence of abnormal filling defects or vessel occlusions. The 6 [place] Arrow neurovascular sheath and the 6 [place] BriteTip guide catheter were then retrieved into the abdominal aorta and exchanged over a J-Tip guidewire for a 6 [place] Pinnacle sheath. This in itself was then replaced with an external closure device. Hemostasis was achieved at the skin entry site. Throughout the procedure, patient's neurologic status and hemodynamic status remained stable. The patient was then transported to the neuro ICU for further management. IMPRESSION: Endovascular superselective embolization of abnormally prominent superior thyroidal arteries bilaterally, and the lingual arteries using PVA particles of sizes 200-300 microns, and 350-500 microns with stasis in the embolized vessels as described above. Electronically Signed   By: Luanne Bras M.D.   On: 10/17/2015 16:27   Ir Neuro Each Add'l After Basic Uni Left (ms)  10/18/2015  INDICATION: History of her pharyngeal mass carcinoma. Patient with uncontrollable bleeding from the tracheostomy site, and from the oropharyngeal mass. EXAM: BILATERAL COMMON CAROTID ARTERIOGRAMS AND BILATERAL EXTERNAL CAROTID ARTERIOGRAMS FOLLOWED BY ENDOVASCULAR SUPERSELECTIVE EMBOLIZATION OF THE SUPERIOR THYROIDAL ARTERIES BILATERALLY, AND THE LINGUAL ARTERIES BILATERALLY WITH 200-300 MICRON PVA PARTICLES AND 350-500 MICRON PVA PARTICLES: MEDICATIONS: Ancef 2 g. The antibiotic was administered within 1 hour of the procedure. ANESTHESIA/SEDATION: General anesthesia provided by the  Department of Anesthesiology [facility] CONTRAST:  80 mL OMNIPAQUE IOHEXOL 300 MG/ML  SOLN FLUOROSCOPY TIME:  Fluoroscopy Time: 64.2 minutes seconds. 2409MG y). COMPLICATIONS: None immediate. PROCEDURE: Informed consent was obtained from the patient following explanation of the procedure, risks, benefits and alternatives. The patient understands, agrees and consents for the procedure.  All questions were addressed. A time out was performed prior to the initiation of the procedure. Maximal barrier sterile technique utilized including caps, mask, sterile gowns, sterile gloves, large sterile drape, hand hygiene, and Betadine prep. Following a full explanation of the procedure along with the potential associated complications, an informed witnessed consent was obtained from the patient and also her sister. Risks of an ischemic stroke of 1% was reviewed with them. After informed consent, patient was put under general anesthesia by the Department of Anesthesiology at [facility]. The right groin was prepped and draped in the usual sterile fashion. Thereafter using modified Seldinger technique, transfemoral access nto the right common femoral artery was obtained without difficulty. Over a 0.035 inch guidewire a 5 [place] Pinnacle sheath was inserted. Through this, and also over a 0.035 inch guidewire a 5 [place] JB 1 catheter was advanced to the aortic arch region and selectively positioned in the right common carotid artery, the right external carotid artery, the left common carotid artery and the left external carotid artery. The patient tolerated the procedure well. FINDINGS: The right common carotid arteriogram demonstrates the right external carotid artery and its major branches to be widely patent. Abnormal prominence of the lingual artery and also the superior thyroidal artery leading inferiorly to a hypervascular mass was noted. The right internal carotid artery at the bulb demonstrated a focal  shallow plaque without associated stenoses or ulcerations. The right internal carotid artery was seen to opacify normally to the cranial skull base. The petrous, cavernous and the supraclinoid segments are widely patent. A right posterior communicating artery is seen to opacify the right posterior cerebral artery distribution. The right middle and the right anterior cerebral arteries are seen to opacify normally into the capillary and venous phases. A few scattered focal areas of caliber irregularity involving the superior division of the right middle cerebral artery and the pericallosal artery on the right suggest intracranial arteriosclerosis. The left common carotid arteriogram demonstrates the left external carotid artery and its major branches to be widely patent. Again abnormal prominence of the superior thyroidal artery, and the lingual artery is noted. The delayed arterial phase demonstrates an exuberant hypervascular blush in the hypopharyngeal region. The left internal carotid artery at the bulb demonstrates a small ulcerative plaque associated with approximately 40% narrowing by the NASCET criteria. More distally the vessel is seen to opacify to the cranial skull base. The petrous, the cavernous and the supraclinoid segments are seen to opacify normally. Left posterior communicating artery is seen opacifying the left posterior cerebral artery distribution. The left middle and the left anterior cerebral artery are seen to opacify into the capillary and the venous phases. Again demonstrated are small focal areas of mild caliber irregularity involving the left pericallosal and the left middle cerebral artery superior division. ENDOVASCULAR SUPERSELECTIVE EMBOLIZATION OF ABNORMALLY PROMINENT SUPERIOR THYROIDAL, AND THE LINGUAL ARTERIES BILATERALLY USING PVA PARTICLES: The 5 [place] JB 1 catheter in the right common carotid artery was exchanged for a 0.035 inch 300 cm Rosen exchange guidewire for a 6 [place] 65  cm Arrow neurovascular sheath using biplane roadmap technique and constant fluoroscopic guidance. Good aspiration was obtained from the hub of the neurovascular sheath. Gentle contrast injection demonstrated no evidence of spasms, dissections or of intraluminal filling defects. This was then connected to continuous heparinized saline infusion. Over the [facility] exchange guidewire, a 90 cm 6 [place] MDP, Softip curved guide catheter was then advanced and positioned just proximal to the right common carotid artery  bifurcation. The guidewire was removed. Good aspiration was obtained from the 6 [place] guide catheter. This was then gently advanced over a 0.035 inch Roadrunner guidewire using biplane roadmap technique and constant fluoroscopic guidance and positioned just proximal to the origin of the superior thyroidal artery, and the lingual artery. At this time using biplane roadmap technique and constant fluoroscopic guidance, in a coaxial manner and with constant heparinized saline infusion, a rapid transit two tip microcatheter was advanced over a 0.014 inch Softip Synchro micro guidewire to the distal end of the 6 [place] guide catheter in the right external carotid artery. Using a torque device and biplane roadmap technique and constant fluoroscopic guidance, the micro guidewire was advanced into the right superior thyroidal artery,. After having established safe positioning of the tip of the microcatheter, and established lack of dangerous communications, embolization of this vessel was then performed using PVA particles of sizes 200-300 microns, and 350-500 microns mixed with 75% contrast and 25% heparinized saline infusion. Embolizations were performed under intermittent fluoroscopy until there was stasis and/or reflux at the tip of the microcatheter. At the end of this, the microcatheter was retrieved more proximally. Control arteriogram performed through the 6 [place] guide catheter demonstrated complete stasis of  contrast in the embolized vessel. Similarly, the right lingual artery was then entered using biplane roadmap technique and constant fluoroscopic guidance. Again after having confirmed safe positioning of the tip of the microcatheter, embolization with the PVA particles of sizes 200-300, and 350-500 microns was then performed mixed in with 25% heparinized saline and 75% contrast. An embolization was again performed until there was stasis at the tip of the microcatheter or reflux. The microcatheter was then retrieved slightly proximally. A control arteriogram performed through the microcatheter demonstrated complete angiographic stasis within the embolized vessel. The microcatheter was then gently retrieved and removed. The 6 [place] guide catheter was then gently retrieved into the right common carotid artery. A control arteriogram performed intracranially demonstrated no evidence of vessel occlusions, or of intraluminal filling defects. Attention was now shifted to the left common carotid artery. After having advanced the 6 [place] Arrow sheath inside of which again was the 90 cm 6 [place] MDP curved Softip BriteTip guide catheter, arteriograms were performed of the left common carotid artery bifurcation and intracranially as described above. The 6 [place] guide catheter catheter was again advanced over a 0.035 inch Roadrunner guidewire using biplane roadmap technique and constant fluoroscopic guidance to just proximal to the origins of the left lingual artery and the left superior thyroidal artery. At this time, using biplane roadmap technique and constant fluoroscopic guidance, in a coaxial manner and with constant heparinized saline infusion, over a 0.014 inch Synchro micro guidewire, access was obtained into the left superior thyroidal artery and left lingual artery as described above. Again after having confirmed safe positioning of the microcatheter tips in the respective vessels eliminating any dangerous  communications, embolization was performed with PVA particles of sizes 200-300 microns, and 350-500 microns in each of these vessels until stasis was obtained. Control arteriograms performed after the embolization and in each of these vessels confirmed complete angiographic occlusion with stasis, the microcatheter was retrieved and completed. The 6 [place] BriteTip guide catheter was then retrieved into the common carotid artery on the left. Arteriograms performed centered intracranially continued to demonstrate no evidence of abnormal filling defects or vessel occlusions. The 6 [place] Arrow neurovascular sheath and the 6 [place] BriteTip guide catheter were then retrieved into the abdominal aorta and exchanged over  a J-Tip guidewire for a 6 [place] Pinnacle sheath. This in itself was then replaced with an external closure device. Hemostasis was achieved at the skin entry site. Throughout the procedure, patient's neurologic status and hemodynamic status remained stable. The patient was then transported to the neuro ICU for further management. IMPRESSION: Endovascular superselective embolization of abnormally prominent superior thyroidal arteries bilaterally, and the lingual arteries using PVA particles of sizes 200-300 microns, and 350-500 microns with stasis in the embolized vessels as described above. Electronically Signed   By: Luanne Bras M.D.   On: 10/17/2015 16:27   Ir Neuro Each Add'l After Basic Uni Right (ms)  10/18/2015  INDICATION: History of her pharyngeal mass carcinoma. Patient with uncontrollable bleeding from the tracheostomy site, and from the oropharyngeal mass. EXAM: BILATERAL COMMON CAROTID ARTERIOGRAMS AND BILATERAL EXTERNAL CAROTID ARTERIOGRAMS FOLLOWED BY ENDOVASCULAR SUPERSELECTIVE EMBOLIZATION OF THE SUPERIOR THYROIDAL ARTERIES BILATERALLY, AND THE LINGUAL ARTERIES BILATERALLY WITH 200-300 MICRON PVA PARTICLES AND 350-500 MICRON PVA PARTICLES: MEDICATIONS: Ancef 2 g. The antibiotic was  administered within 1 hour of the procedure. ANESTHESIA/SEDATION: General anesthesia provided by the Department of Anesthesiology [facility]. CONTRAST:  80 mL OMNIPAQUE IOHEXOL 300 MG/ML  SOLN FLUOROSCOPY TIME:  Fluoroscopy Time: 64.2 minutes seconds. 2409MG y). COMPLICATIONS: None immediate. PROCEDURE: Informed consent was obtained from the patient following explanation of the procedure, risks, benefits and alternatives. The patient understands, agrees and consents for the procedure. All questions were addressed. A time out was performed prior to the initiation of the procedure. Maximal barrier sterile technique utilized including caps, mask, sterile gowns, sterile gloves, large sterile drape, hand hygiene, and Betadine prep. Following a full explanation of the procedure along with the potential associated complications, an informed witnessed consent was obtained from the patient and also her sister. Risks of an ischemic stroke of 1% was reviewed with them. After informed consent, patient was put under general anesthesia by the Department of Anesthesiology at [facility]. The right groin was prepped and draped in the usual sterile fashion. Thereafter using modified Seldinger technique, transfemoral access nto the right common femoral artery was obtained without difficulty. Over a 0.035 inch guidewire a 5 French Pinnacle sheath was inserted. Through this, and also over a 0.035 inch guidewire a 5 [place] JB 1 catheter was advanced to the aortic arch region and selectively positioned in the right common carotid artery, the right external carotid artery, the left common carotid artery and the left external carotid artery. The patient tolerated the procedure well. FINDINGS: The right common carotid arteriogram demonstrates the right external carotid artery and its major branches to be widely patent. Abnormal prominence of the lingual artery and also the superior thyroidal artery leading inferiorly to a  hypervascular mass was noted. The right internal carotid artery at the bulb demonstrated a focal shallow plaque without associated stenoses or ulcerations. The right internal carotid artery was seen to opacify normally to the cranial skull base. The petrous, cavernous and the supraclinoid segments are widely patent. A right posterior communicating artery is seen to opacify the right posterior cerebral artery distribution. The right middle and the right anterior cerebral arteries are seen to opacify normally into the capillary and venous phases. A few scattered focal areas of caliber irregularity involving the superior division of the right middle cerebral artery and the pericallosal artery on the right suggest intracranial arteriosclerosis. The left common carotid arteriogram demonstrates the left external carotid artery and its major branches to be widely patent. Again abnormal prominence of the  superior thyroidal artery, and the lingual artery is noted. The delayed arterial phase demonstrates an exuberant hypervascular blush in the hypopharyngeal region. The left internal carotid artery at the bulb demonstrates a small ulcerative plaque associated with approximately 40% narrowing by the NASCET criteria. More distally the vessel is seen to opacify to the cranial skull base. The petrous, the cavernous and the supraclinoid segments are seen to opacify normally. Left posterior communicating artery is seen opacifying the left posterior cerebral artery distribution. The left middle and the left anterior cerebral artery are seen to opacify into the capillary and the venous phases. Again demonstrated are small focal areas of mild caliber irregularity involving the left pericallosal and the left middle cerebral artery superior division. ENDOVASCULAR SUPERSELECTIVE EMBOLIZATION OF ABNORMALLY PROMINENT SUPERIOR THYROIDAL, AND THE LINGUAL ARTERIES BILATERALLY USING PVA PARTICLES: The 5 [place] JB 1 catheter in the right common  carotid artery was exchanged for a 0.035 inch 300 cm Rosen exchange guidewire for a 6 French 65 cm Arrow neurovascular sheath using biplane roadmap technique and constant fluoroscopic guidance. Good aspiration was obtained from the hub of the neurovascular sheath. Gentle contrast injection demonstrated no evidence of spasms, dissections or of intraluminal filling defects. This was then connected to continuous heparinized saline infusion. Over the [facility] exchange guidewire, a 90 cm 6 [place] MDP, Softip curved guide catheter was then advanced and positioned just proximal to the right common carotid artery bifurcation. The guidewire was removed. Good aspiration was obtained from the 6 [place] guide catheter. This was then gently advanced over a 0.035 inch Roadrunner guidewire using biplane roadmap technique and constant fluoroscopic guidance and positioned just proximal to the origin of the superior thyroidal artery, and the lingual artery. At this time using biplane roadmap technique and constant fluoroscopic guidance, in a coaxial manner and with constant heparinized saline infusion, a rapid transit two tip microcatheter was advanced over a 0.014 inch Softip Synchro micro guidewire to the distal end of the 6 [place] guide catheter in the right external carotid artery. Using a torque device and biplane roadmap technique and constant fluoroscopic guidance, the micro guidewire was advanced into the right superior thyroidal artery,. After having established safe positioning of the tip of the microcatheter, and established lack of dangerous communications, embolization of this vessel was then performed using PVA particles of sizes 200-300 microns, and 350-500 microns mixed with 75% contrast and 25% heparinized saline infusion. Embolizations were performed under intermittent fluoroscopy until there was stasis and/or reflux at the tip of the microcatheter. At the end of this, the microcatheter was retrieved more proximally.  Control arteriogram performed through the 6 [place] guide catheter demonstrated complete stasis of contrast in the embolized vessel. Similarly, the right lingual artery was then entered using biplane roadmap technique and constant fluoroscopic guidance. Again after having confirmed safe positioning of the tip of the microcatheter, embolization with the PVA particles of sizes 200-300, and 350-500 microns was then performed mixed in with 25% heparinized saline and 75% contrast. An embolization was again performed until there was stasis at the tip of the microcatheter or reflux. The microcatheter was then retrieved slightly proximally. A control arteriogram performed through the microcatheter demonstrated complete angiographic stasis within the embolized vessel. The microcatheter was then gently retrieved and removed. The 6 [place] guide catheter was then gently retrieved into the right common carotid artery. A control arteriogram performed intracranially demonstrated no evidence of vessel occlusions, or of intraluminal filling defects. Attention was now shifted to the left common carotid artery.  After having advanced the 6 [place] Arrow sheath inside of which again was the 90 cm 6 [place] MDP curved Softip BriteTip guide catheter, arteriograms were performed of the left common carotid artery bifurcation and intracranially as described above. The 6 [place] guide catheter catheter was again advanced over a 0.035 inch Roadrunner guidewire using biplane roadmap technique and constant fluoroscopic guidance to just proximal to the origins of the left lingual artery and the left superior thyroidal artery. At this time, using biplane roadmap technique and constant fluoroscopic guidance, in a coaxial manner and with constant heparinized saline infusion, over a 0.014 inch Synchro micro guidewire, access was obtained into the left superior thyroidal artery and left lingual artery as described above. Again after having confirmed safe  positioning of the microcatheter tips in the respective vessels eliminating any dangerous communications, embolization was performed with PVA particles of sizes 200-300 microns, and 350-500 microns in each of these vessels until stasis was obtained. Control arteriograms performed after the embolization and in each of these vessels confirmed complete angiographic occlusion with stasis, the microcatheter was retrieved and completed. The 6 French BriteTip guide catheter was then retrieved into the common carotid artery on the left. Arteriograms performed centered intracranially continued to demonstrate no evidence of abnormal filling defects or vessel occlusions. The 6 [place] Arrow neurovascular sheath and the 6 [place] BriteTip guide catheter were then retrieved into the abdominal aorta and exchanged over a J-Tip guidewire for a 6 [place] Pinnacle sheath. This in itself was then replaced with an external closure device. Hemostasis was achieved at the skin entry site. Throughout the procedure, patient's neurologic status and hemodynamic status remained stable. The patient was then transported to the neuro ICU for further management. IMPRESSION: Endovascular superselective embolization of abnormally prominent superior thyroidal arteries bilaterally, and the lingual arteries using PVA particles of sizes 200-300 microns, and 350-500 microns with stasis in the embolized vessels as described above. Electronically Signed   By: Luanne Bras M.D.   On: 10/17/2015 16:27    Oren Binet, MD  [facility] Pager:[phone]  If 7PM-7AM, please contact night-coverage [email or web address] Password [email or web address] 10/22/2015, 2:45 PM   LOS: 1 day

## 2015-10-22 NOTE — Progress Notes (Signed)
[facility]  Patient Status: Active pt with [facility] prior to this admission  [facility] is providing the following services: [facility], Respiratory Care team and home TPN pharmacy team for TPN and IV ABX at home.  We will follow while inpatient and support DC to home.  If patient discharges after hours, please call [phone].   [name] 10/22/2015, 7:45 AM

## 2015-10-22 NOTE — Progress Notes (Signed)
Critical K+ value called from lab - 2.6. Notified Dr. Sloan Leiter via Shea Evans tx.

## 2015-10-23 DIAGNOSIS — Z452 Encounter for adjustment and management of vascular access device: Secondary | ICD-10-CM | POA: Diagnosis not present

## 2015-10-23 DIAGNOSIS — R7881 Bacteremia: Secondary | ICD-10-CM | POA: Diagnosis not present

## 2015-10-23 DIAGNOSIS — E876 Hypokalemia: Secondary | ICD-10-CM | POA: Diagnosis not present

## 2015-10-23 DIAGNOSIS — B49 Unspecified mycosis: Secondary | ICD-10-CM | POA: Diagnosis not present

## 2015-10-23 LAB — GLUCOSE, CAPILLARY
GLUCOSE-CAPILLARY: 112 mg/dL — AB (ref 65–99)
GLUCOSE-CAPILLARY: 81 mg/dL (ref 65–99)
Glucose-Capillary: 104 mg/dL — ABNORMAL HIGH (ref 65–99)
Glucose-Capillary: 88 mg/dL (ref 65–99)

## 2015-10-23 LAB — MAGNESIUM: Magnesium: 1.8 mg/dL (ref 1.7–2.4)

## 2015-10-23 LAB — BASIC METABOLIC PANEL
Anion gap: 10 (ref 5–15)
BUN: 17 mg/dL (ref 6–20)
CHLORIDE: 114 mmol/L — AB (ref 101–111)
CO2: 24 mmol/L (ref 22–32)
CREATININE: 0.91 mg/dL (ref 0.44–1.00)
Calcium: 8.8 mg/dL — ABNORMAL LOW (ref 8.9–10.3)
GFR calc Af Amer: >60 mL/min (ref >60)
Glucose, Bld: 93 mg/dL (ref 65–99)
Potassium: 3 mmol/L — ABNORMAL LOW (ref 3.5–5.1)
SODIUM: 148 mmol/L — AB (ref 135–145)

## 2015-10-23 LAB — PHOSPHORUS: Phosphorus: 3.6 mg/dL (ref 2.5–4.6)

## 2015-10-23 MED ORDER — HEPARIN SOD (PORK) LOCK FLUSH 100 UNIT/ML IV SOLN: 250.0000 [IU] | INTRAVENOUS | Status: DC | PRN

## 2015-10-23 MED ORDER — FAT EMULSION 20 % IV EMUL
240.0000 mL | INTRAVENOUS | Status: DC
  Filled 2015-10-23: qty 250

## 2015-10-23 MED ORDER — POTASSIUM CHLORIDE 10 MEQ/100ML IV SOLN
10.0000 meq | INTRAVENOUS | Status: AC
Start: 2015-10-23 — End: 2015-10-23
  Administered 2015-10-23 (×3): 10 meq via INTRAVENOUS
  Filled 2015-10-23 (×3): qty 100

## 2015-10-23 MED ORDER — POTASSIUM CHLORIDE 10 MEQ/100ML IV SOLN
10.0000 meq | INTRAVENOUS | Status: AC
  Administered 2015-10-23 (×3): 10 meq via INTRAVENOUS
  Filled 2015-10-23 (×3): qty 100

## 2015-10-23 MED ORDER — M.V.I. ADULT IV INJ
INJECTION | INTRAVENOUS | Status: DC
  Filled 2015-10-23: qty 1992

## 2015-10-23 NOTE — Progress Notes (Signed)
Pt family member here to provide ride for discharge for patient. Pt has trach and requires continuous oxygenation at 3L O2. Family has no oxygen tank for transportation home. Family instructed pt needs continuous oxygenation for transportation home. Patient refuses for family member to leave and return with oxygen tank for safe transportation home. Patient instructed this is leaving against medical advice. AMA form signed and placed on chart. MD notified.   Raliegh Ip RN

## 2015-10-23 NOTE — Progress Notes (Signed)
Spoke with son Jenny Reichmann who stated that he was on his way to pick him mother up for discharge. Pt stable, no noted distress. Denying pain or discomfort.Marland Kitchen

## 2015-10-23 NOTE — Progress Notes (Addendum)
Pt taken out to car per wheelchair by Charge RN on 3L O2. No signs of distress. Pt transported home on room air.   Raliegh Ip RN

## 2015-10-23 NOTE — Progress Notes (Signed)
Message left for son Bailey Mech per pt request.

## 2015-10-23 NOTE — Progress Notes (Signed)
Pt son was called for pickup but did not answer the phone.

## 2015-10-23 NOTE — Progress Notes (Signed)
Spoke with CM, pt is set to go home today per Md order. Home health services is aware of her discharge.

## 2015-10-23 NOTE — Progress Notes (Signed)
PARENTERAL NUTRITION CONSULT NOTE - FOLLOW UP  Pharmacy Consult for TPN Indication: Laryngeal CA, continuing TPN from home  No Known Allergies  Patient Measurements: Height: 5\' 3"  (160 cm) Weight: 186 lb 12.8 oz (84.732 kg) IBW/kg (Calculated) : 52.4 Adjusted Body Weight:  Usual Weight:   Vital Signs: Temp: 97.7 F (36.5 C) (03/12 0551) Temp Source: Oral (03/12 0551) BP: 128/59 mmHg (03/12 0551) Pulse Rate: 97 (03/12 0551) Intake/Output from previous day: 03/11 0701 - 03/12 0700 In: -  Out: 300 [Urine:300] Intake/Output from this shift:    Labs:  Recent Labs  10/21/15 1627  WBC 25.6*  HGB 9.5*  HCT 30.3*  PLT 321     Recent Labs  10/21/15 1627 10/21/15 1630 10/22/15 0808 10/22/15 1820  NA 144  --  148* 149*  K 2.8*  --  2.6* 3.1*  CL 108  --  112* 111  CO2 25  --  24 23  GLUCOSE 278*  --  113* 104*  BUN 23*  --  19 18  CREATININE 0.73  --  0.88 0.86  CALCIUM 9.1  --  8.8* 9.0  MG  --  1.7  --   --    Estimated Creatinine Clearance: 72.6 mL/min (by C-G formula based on Cr of 0.86).    Recent Labs  10/22/15 2148 10/23/15 0123 10/23/15 0529  GLUCAP 105* 104* 112*    Medications:  Scheduled:  . ampicillin (OMNIPEN) IV  2 g Intravenous Q4H  . enoxaparin (LOVENOX) injection  40 mg Subcutaneous QHS  . fluconazole (DIFLUCAN) IV  400 mg Intravenous Q24H  . insulin aspart  0-9 Units Subcutaneous 6 times per day  . pantoprazole sodium  40 mg Per Tube Q2000  . potassium chloride  40 mEq Oral Daily    Insulin Requirements 0 units SSI  Current Nutrition:  NPO  Nutritional Goals:  Awaiting RD assessment  ** 3/11: Current formula per [facility] is 2051mL of TPN with lipids (3:1) at 23ml/hr, providing 1899 kcal & 100g protein  Assessment: 59 y/o female on home TPN with recent admit for hemoptysis & d/c home on 10/18/15; now re-admitted with displaced PICC line.. She has laryngeal cancer and is s/p trach. She has been on home TPN due to unclear  inability to obtain PEG - GI input planned. PICC replaced this AM.  Surgeries/Procedures: 3/4 s/p IR embolization bilateral lingual and superior thyroidal arteries  GI: 3/6: Albumin low at 1.9. Prealbumin down to 9.6 from 17.1 on 2/28. PPI-po  Endo: Serum gluc 113. CBGs controlled, no insulin in TPN  Lytes:K 3- 3 runs ordered by MD/rec'd 7 runs yest , Na 148,  Mg 1.8, Phos 3.6.  On 1/2NS at 68ml/hr  Renal: Cr 0.91, CrCl ~70 ml/hr.  UOP appears incomplete.  Pulm: trach, FiO2 28%, 5L  Cards: VSS  Hepatobil: LFTs ok and Tbili 0.2,TG down to 172 on 3/6  ID: Enterococcal bacteremia on ampicillin IV, fluconazole IV at home, through 3/15 per [facility] admission note. WBC elevated at 25.6.  Best Practices: MC, Lovenox VTE px TPN Access: Double lumen PICC 10/22/15 >> TPN start date: 3/5 (on outpt) >>  Plan:  Clinimix-E 5/15 at 10ml/hr and lipids at 59ml/hr Add daily MVI and Trace elements to TPN CMet, Mg, Phos in AM Sens SSI q4 K-runs x 3 for total of 6 runs today TPN labs qMon/Thurs  Gracy Bruins, PharmD Clinical Pharmacist [facility]

## 2015-10-24 ENCOUNTER — Emergency Department (HOSPITAL_COMMUNITY): Payer: Medicaid Other

## 2015-10-24 ENCOUNTER — Emergency Department (HOSPITAL_COMMUNITY)
Admission: EM | Admit: 2015-10-24 | Discharge: 2015-11-12 | Disposition: E | Payer: Medicaid Other | Attending: Emergency Medicine | Admitting: Emergency Medicine

## 2015-10-24 DIAGNOSIS — Z794 Long term (current) use of insulin: Secondary | ICD-10-CM | POA: Diagnosis not present

## 2015-10-24 DIAGNOSIS — R55 Syncope and collapse: Secondary | ICD-10-CM | POA: Diagnosis not present

## 2015-10-24 DIAGNOSIS — I469 Cardiac arrest, cause unspecified: Secondary | ICD-10-CM | POA: Diagnosis present

## 2015-10-24 DIAGNOSIS — Z87891 Personal history of nicotine dependence: Secondary | ICD-10-CM | POA: Diagnosis not present

## 2015-10-24 DIAGNOSIS — I10 Essential (primary) hypertension: Secondary | ICD-10-CM | POA: Diagnosis not present

## 2015-10-24 DIAGNOSIS — K920 Hematemesis: Secondary | ICD-10-CM | POA: Insufficient documentation

## 2015-10-24 DIAGNOSIS — E119 Type 2 diabetes mellitus without complications: Secondary | ICD-10-CM | POA: Diagnosis not present

## 2015-10-24 DIAGNOSIS — R040 Epistaxis: Secondary | ICD-10-CM | POA: Insufficient documentation

## 2015-10-24 DIAGNOSIS — Z8589 Personal history of malignant neoplasm of other organs and systems: Secondary | ICD-10-CM | POA: Insufficient documentation

## 2015-10-24 DIAGNOSIS — E669 Obesity, unspecified: Secondary | ICD-10-CM | POA: Diagnosis not present

## 2015-10-24 DIAGNOSIS — Z79899 Other long term (current) drug therapy: Secondary | ICD-10-CM | POA: Insufficient documentation

## 2015-10-24 DIAGNOSIS — Z862 Personal history of diseases of the blood and blood-forming organs and certain disorders involving the immune mechanism: Secondary | ICD-10-CM | POA: Insufficient documentation

## 2015-10-24 DIAGNOSIS — Z87448 Personal history of other diseases of urinary system: Secondary | ICD-10-CM | POA: Diagnosis not present

## 2015-10-24 DIAGNOSIS — R609 Edema, unspecified: Secondary | ICD-10-CM | POA: Insufficient documentation

## 2015-10-24 LAB — COMPREHENSIVE METABOLIC PANEL
ALBUMIN: 1.3 g/dL — AB (ref 3.5–5.0)
ALT: 76 U/L — ABNORMAL HIGH (ref 14–54)
ANION GAP: 21 — AB (ref 5–15)
AST: 127 U/L — ABNORMAL HIGH (ref 15–41)
Alkaline Phosphatase: 103 U/L (ref 38–126)
BILIRUBIN TOTAL: 0.2 mg/dL — AB (ref 0.3–1.2)
BUN: 19 mg/dL (ref 6–20)
CO2: 11 mmol/L — ABNORMAL LOW (ref 22–32)
Calcium: 8.2 mg/dL — ABNORMAL LOW (ref 8.9–10.3)
Chloride: 123 mmol/L — ABNORMAL HIGH (ref 101–111)
Creatinine, Ser: 1.2 mg/dL — ABNORMAL HIGH (ref 0.44–1.00)
GFR calc Af Amer: 56 mL/min — ABNORMAL LOW (ref >60)
GFR, EST NON AFRICAN AMERICAN: 48 mL/min — AB (ref >60)
Glucose, Bld: 382 mg/dL — ABNORMAL HIGH (ref 65–99)
POTASSIUM: 3.4 mmol/L — AB (ref 3.5–5.1)
Sodium: 155 mmol/L — ABNORMAL HIGH (ref 135–145)
TOTAL PROTEIN: 3.8 g/dL — AB (ref 6.5–8.1)

## 2015-10-24 LAB — CBC WITH DIFFERENTIAL/PLATELET
Basophils Absolute: 0 10*3/uL (ref 0.0–0.1)
Basophils Relative: 0 %
EOS PCT: 0 %
Eosinophils Absolute: 0 10*3/uL (ref 0.0–0.7)
HEMATOCRIT: 13.7 % — AB (ref 36.0–46.0)
Hemoglobin: 3.9 g/dL — CL (ref 12.0–15.0)
LYMPHS ABS: 4.5 10*3/uL — AB (ref 0.7–4.0)
Lymphocytes Relative: 36 %
MCH: 26.4 pg (ref 26.0–34.0)
MCHC: 28.5 g/dL — AB (ref 30.0–36.0)
MCV: 92.6 fL (ref 78.0–100.0)
MONO ABS: 0.5 10*3/uL (ref 0.1–1.0)
Monocytes Relative: 4 %
NEUTROS PCT: 60 %
Neutro Abs: 7.5 10*3/uL (ref 1.7–7.7)
Platelets: 149 10*3/uL — ABNORMAL LOW (ref 150–400)
RBC: 1.48 MIL/uL — AB (ref 3.87–5.11)
RDW: 17.2 % — AB (ref 11.5–15.5)
WBC: 12.5 10*3/uL — AB (ref 4.0–10.5)

## 2015-10-24 LAB — PREPARE RBC (CROSSMATCH)

## 2015-10-24 LAB — TROPONIN I: Troponin I: 0.05 ng/mL — ABNORMAL HIGH (ref <0.031)

## 2015-10-24 MED ORDER — EPINEPHRINE HCL 0.1 MG/ML IJ SOSY
PREFILLED_SYRINGE | INTRAMUSCULAR | Status: AC | PRN
  Administered 2015-10-24: 0.5 mg via INTRAVENOUS
  Administered 2015-10-24 (×5): 1 mg via INTRAVENOUS

## 2015-10-24 MED ORDER — SODIUM CHLORIDE 0.9 % IV SOLN: 10.0000 mL/h | Freq: Once | INTRAVENOUS | Status: DC

## 2015-10-25 ENCOUNTER — Ambulatory Visit: Payer: Medicaid Other | Admitting: Physician Assistant

## 2015-10-25 LAB — TYPE AND SCREEN
ABO/RH(D): B POS
Antibody Screen: NEGATIVE
UNIT DIVISION: 0
Unit division: 0

## 2015-10-25 MED FILL — Medication: Qty: 1 | Status: AC

## 2015-10-27 ENCOUNTER — Inpatient Hospital Stay: Payer: Medicaid Other | Admitting: Family Medicine

## 2015-11-12 NOTE — Code Documentation (Signed)
CPR initiated

## 2015-11-12 NOTE — Code Documentation (Addendum)
CPR intitiated.

## 2015-11-12 NOTE — Code Documentation (Signed)
Rate changed to 946ml/hr for blood.

## 2015-11-12 NOTE — ED Notes (Signed)
Family at bedside. 

## 2015-11-12 NOTE — ED Notes (Signed)
Pt transported to morgue 

## 2015-11-12 NOTE — ED Provider Notes (Signed)
CSN: [id number]     Arrival date & time 26-Oct-2015  1612 History   First MD Initiated Contact with Patient 2015-10-26 1615     No chief complaint on file.    (Consider location/radiation/quality/duration/timing/severity/associated sxs/prior Treatment) Patient is a 60 y.o. female presenting with general illness. The history is provided by the EMS personnel.  Illness Location:  Upper GI bleed, cardiac arrest Severity:  Severe Onset quality:  Sudden Duration: 30 minutes. Timing:  Constant Progression:  Waxing and waning Chronicity:  New Context:  Patient with hx of throat cancer s/p tracheostomy, recent hospitalization, had severe epistaxsis and hematemesis for which EMS was called out for. patient unresponsive and lost pulses in route. PEA inital rhythm. ROSC after 11 minutes of CPR Relieved by:  Epinephrine and chest compressions Associated symptoms: loss of consciousness     Past Medical History  Diagnosis Date  . Thyroid disease   . Hypertension   . Neck mass hospitalized 08/29/2015  . Type II diabetes mellitus ([place])   . Left kidney mass   . Anemia, chronic disease    Past Surgical History  Procedure Laterality Date  . No past surgeries    . Esophagogastroduodenoscopy N/A 09/04/2015    Procedure: ESOPHAGOGASTRODUODENOSCOPY (EGD);  Surgeon: Manus Gunning, MD;  Location: [place];  Service: Gastroenterology;  Laterality: N/A;  . Direct laryngoscopy N/A 09/02/2015    Procedure: DIRECT LARYNGOSCOPY WITH BIOPSY;  Surgeon: Izora Gala, MD;  Location: [place];  Service: ENT;  Laterality: N/A;  . Esophagoscopy N/A 09/02/2015    Procedure: ESOPHAGOSCOPY;  Surgeon: Izora Gala, MD;  Location: [place];  Service: ENT;  Laterality: N/A;  . Tracheostomy tube placement N/A 09/04/2015    Procedure: TRACHEOSTOMY;  Surgeon: Izora Gala, MD;  Location: [place];  Service: ENT;  Laterality: N/A;  . Direct laryngoscopy  09/04/2015    Procedure: DIRECT View LARYNGOSCOPY with cautery of of tumor;   Surgeon: Izora Gala, MD;  Location: [place];  Service: ENT;;  . Radiology with anesthesia N/A 10/15/2015    Procedure: RADIOLOGY WITH ANESTHESIA;  Surgeon: Luanne Bras, MD;  Location: [place];  Service: Radiology;  Laterality: N/A;   Family History  Problem Relation Age of Onset  . Diabetes Mother   . Hypertension Mother   . Heart disease Mother   . Diabetes Sister   . Hypertension Sister   . Cancer Daughter     Stomach   Social History  Substance Use Topics  . Smoking status: Former Smoker -- 0.00 packs/day for 34 years    Types: Cigarettes    Quit date: 07/14/2015  . Smokeless tobacco: Never Used  . Alcohol Use: No   OB History    No data available     Review of Systems  Unable to perform ROS: Patient unresponsive  Neurological: Positive for loss of consciousness.      Allergies  Review of patient's allergies indicates no known allergies.  Home Medications   Prior to Admission medications   Medication Sig Start Date End Date Taking? Authorizing Provider  acetaminophen (TYLENOL) 325 MG suppository Place 1 suppository (325 mg total) rectally every 4 (four) hours as needed for mild pain. Patient taking differently: Place 325 mg rectally every 4 (four) hours as needed for mild pain (nausea).  09/12/15   Thurnell Lose, MD  albuterol (PROVENTIL) (2.5 MG/3ML) 0.083% nebulizer solution Take 3 mLs (2.5 mg total) by nebulization every 6 (six) hours as needed for wheezing or shortness of breath. 09/12/15  Thurnell Lose, MD  ampicillin 2 g in sodium chloride 0.9 % 50 mL Inject 2 g into the vein every 4 (four) hours. 10/11/15   Domenic Polite, MD  Blood Glucose Monitoring Suppl (TRUE METRIX METER) DEVI 1 each by Does not apply route 3 (three) times daily before meals. 09/16/15   Arnoldo Morale, MD  fluconazole (DIFLUCAN) 400-0.9 MG/200ML-% IVPB Inject 200 mLs (400 mg total) into the vein daily. Until 3/15 10/11/15 10/26/15  Domenic Polite, MD  glucose blood (FREESTYLE LITE) test  strip For glucose testing every before meals at bedtime. Diagnosis E 11.65  Can substitute to any accepted brand 09/12/15   Thurnell Lose, MD  glucose blood (TRUE METRIX BLOOD GLUCOSE TEST) test strip Use 3 times daily before meals 09/16/15   Arnoldo Morale, MD  insulin aspart (NOVOLOG) 100 UNIT/ML injection Before each meal 3 times a day, 140-199 - 2 units, 200-250 - 4 units, 251-299 - 6 units,  300-349 - 8 units,  350 or above 10 units. Dispense syringes and needles as needed, Ok to switch to PEN if approved. Substitute to any brand approved. DX DM2, Code E11.65 Patient taking differently: Inject 2-10 Units into the skin 3 (three) times daily with meals. Before each meal 3 times a day per sliding scale: CBG 140-199 - 2 units, 200-250 - 4 units, 251-299 - 6 units,  300-349 - 8 units,  350 or above 10 units. Dispense syringes and needles as needed, Ok to switch to PEN if approved. Substitute to any brand approved. DX DM2, Code E11.65 09/12/15   Thurnell Lose, MD  NONFORMULARY OR COMPOUNDED ITEM TPN with electrolytes and Fat emulsion per [place] by protocol via PICC 10/11/15   Domenic Polite, MD  pantoprazole (PROTONIX) 40 MG tablet Take 1 tablet (40 mg total) by mouth daily. 10/13/15   Blanchie Dessert, MD  TRUEPLUS LANCETS 28G MISC 1 each by Does not apply route 3 (three) times daily before meals. 09/16/15   Arnoldo Morale, MD   BP 100/71 mmHg  Pulse 130  Resp 14 Physical Exam  Constitutional:  Obese african [place] female appearing older than stated age, unresponsive in EMS gurney  HENT:  Head: Normocephalic.  Right Ear: External ear normal.  Left Ear: External ear normal.  Dried blood around nose and mouth with no active bleeding  Eyes:  Pupils 71mm and unreactive   Neck: No JVD present.  tracheostomy in place with no bleeding  Cardiovascular: Intact distal pulses.   Faint radial pulse  Pulmonary/Chest: She has no wheezes. She has no rales.  Bilateral breath sounds with bagging   Abdominal: There is no guarding.  Musculoskeletal: She exhibits edema.  Neurological:  GCS 3.  Skin: Skin is warm and dry.    ED Course  Procedures (including critical care time) Labs Review Labs Reviewed  CBC WITH DIFFERENTIAL/PLATELET - Abnormal; Notable for the following:    WBC 12.5 (*)    RBC 1.48 (*)    Hemoglobin 3.9 (*)    HCT 13.7 (*)    MCHC 28.5 (*)    RDW 17.2 (*)    Platelets 149 (*)    Lymphs Abs 4.5 (*)    All other components within normal limits  COMPREHENSIVE METABOLIC PANEL - Abnormal; Notable for the following:    Sodium 155 (*)    Potassium 3.4 (*)    Chloride 123 (*)    CO2 11 (*)    Glucose, Bld 382 (*)    Creatinine, Ser  1.20 (*)    Calcium 8.2 (*)    Total Protein 3.8 (*)    Albumin 1.3 (*)    AST 127 (*)    ALT 76 (*)    Total Bilirubin 0.2 (*)    GFR calc non Af Amer 48 (*)    GFR calc Af Amer 56 (*)    Anion gap 21 (*)    All other components within normal limits  TROPONIN I - Abnormal; Notable for the following:    Troponin I 0.05 (*)    All other components within normal limits  TYPE AND SCREEN  PREPARE RBC (CROSSMATCH)    Imaging Review No results found. I have personally reviewed and evaluated these images and lab results as part of my medical decision-making.   EKG Interpretation   Date/Time:  11-11-2015 16:16:48 EDT Ventricular Rate:  127 PR Interval:  95 QRS Duration: 104 QT Interval:  331 QTC Calculation: 481 R Axis:   -50 Text Interpretation:  Sinus tachycardia Left anterior fascicular block  Left axis deviation No significant change since last tracing Confirmed by  Ashok Cordia  MD, Lennette Bihari ([id number]) on 2015/11/11 4:22:13 PM      MDM   Final diagnoses:  Cardiac arrest [facility])    The patient is a 60 year old female with a past medical history concerning for cancer with a recent tracheostomy and hospitalization. EMS reports they were called for severe epistaxis and hematemesis however patient lost pulses  improved with initial rhythm PDA. 3 doses of epinephrine and 11 minutes of compressions given before ROSC achieved. On arrival patient is unresponsive with GCS of 3. Faint radial pulses present. EKG is not concerning for STEMI. Given story of perfuse GI bleed and patients history patient given emergency release blood on arrival. Shortly after arrival patient loses pulses again. CPR performed patient never achieving shockable rhythm. ROSC achieved briefly and patient is started on Epinephrine drip however patient loses pulses again. Additional compressions and appendectomy given however patient was unable to regain pulses. Time of death called at 7:09 PM. Family updated on patient's status. All questions answered.   Patient seen with attending, Dr. Ashok Cordia, who oversaw clinical decision making.     Margaretann Loveless, MD 10/25/15 [id number]  Lajean Saver, MD 10/25/15 [phone]

## 2015-11-12 NOTE — Code Documentation (Signed)
Patient time of death occurred at 71.

## 2015-11-12 NOTE — Code Documentation (Signed)
EDP examining heart with bedside ultrasound.

## 2015-11-12 NOTE — Progress Notes (Signed)
Chaplain assumed this case from daytime chaplain at 5:10 pm just after pt had been pronounced dead. Daytime chaplain introduced me to family in consult B. ED doctor accompanied [place] to consult room and informed family that their mom had passed. Pt's daughter was distraught at the news and her two brothers tried to console her. Chaplain offered to call her pastor and she readily agreed. When pt had been prepped, chaplain escorted family to bedside to spend time with pt. In due time daughter's pastor arrived and I briefed him and brought him to Trauma A to be with family. Various other family members and friends came over a two to three hour period. I got contact information for oldest son and gave to RN. I gave oldest son the phone number for [facility] since family hadn't yet decided on funeral home. Eventually I let family know that pt needed to be taken to morgue and led them back to consult B to continue their family time. Family and pastor expressed appreciation for chaplain support.

## 2015-11-12 NOTE — Code Documentation (Signed)
[facility]- pt here after she was found to be vomiting copious amounts of blood. Pt has chronic trach. Pt initially in PEA with a rate of 50. Pt has pulses on arrival, rate of 140, last BP of 98/62. Pt unresponsive during transport and on arrival.

## 2015-11-12 NOTE — Progress Notes (Signed)
   11/18/2015 1717  Clinical Encounter Type  Visited With Family;Health care provider  Visit Type Initial;Critical Care;Death;ED  Referral From [place] responded to a post-CPR in the ED that turned into a death. Chaplain assisted the family in receiving medical consultation, and offered hospitality and support. Chaplain passed off to tonight's on-call chaplain. Our services are available as needed.   Jeri Lager, Chaplain November 18, 2015 5:18 PM

## 2015-11-12 NOTE — ED Notes (Signed)
Pt here after profuse vomiting of blood. Pt has hx of throat cancer and chronic trach. Pt unresponsive with EMS. Regained pulses prior to arrival at 140bpm ST on the monitor. Pt received 3 epi and 2mg  of narcan PTA. Pt has a PICC line as well as IO line established.

## 2015-11-12 NOTE — ED Notes (Signed)
Discarded pt's cut up clothes, due to them containing fecal matter. Informed Quincy Simmonds - Charge RN and [place] - RN.

## 2015-11-12 DEATH — deceased

## 2015-11-28 ENCOUNTER — Encounter: Payer: Self-pay | Admitting: Family Medicine

## 2017-09-02 IMAGING — DX DG CHEST 2V
2 series · 2 of 2 positions shown · non-contrast
Comparison: None.

CLINICAL DATA: One month history of chest pain and hoarse voice.

EXAM:
CHEST  2 VIEW

[chest lat]
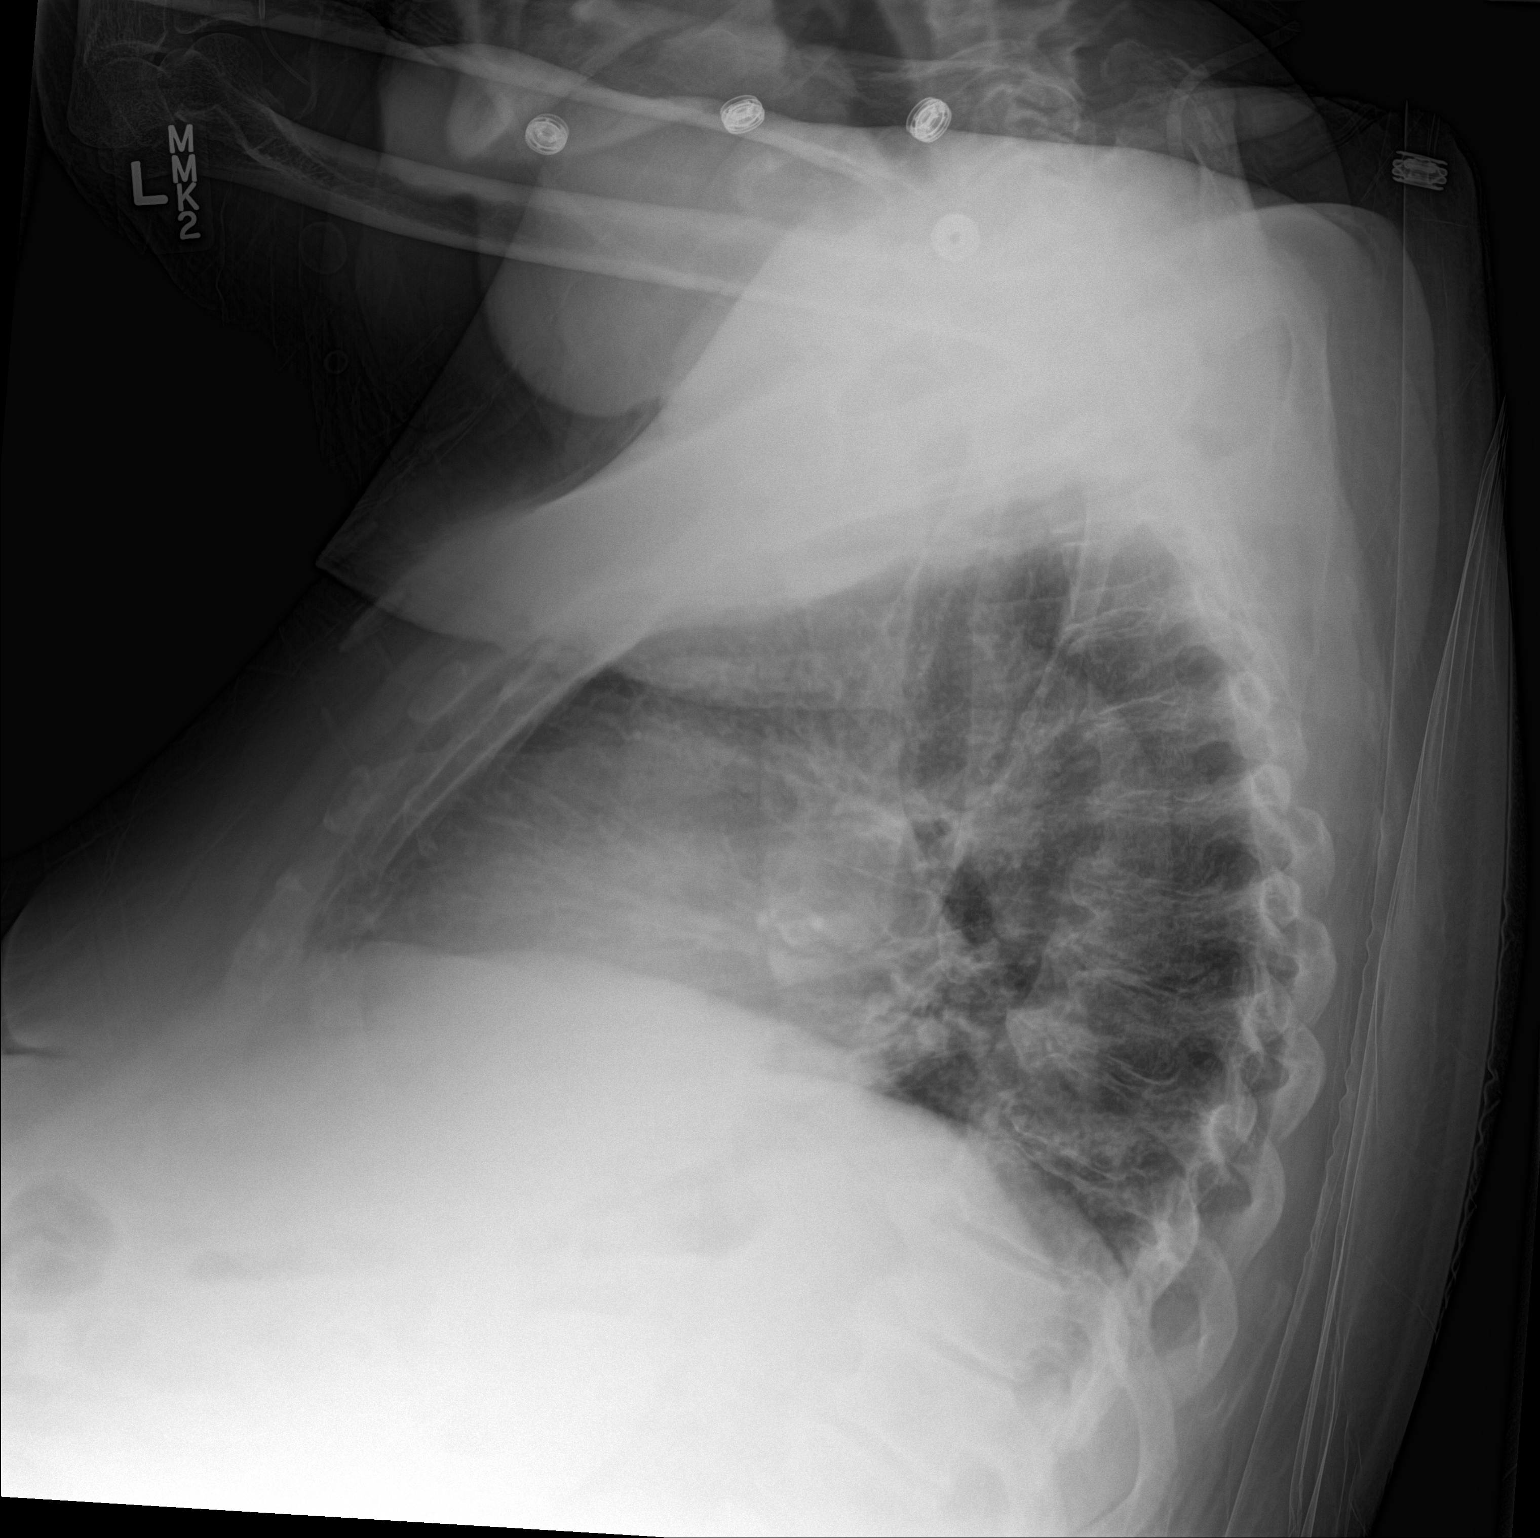

[chest ap]
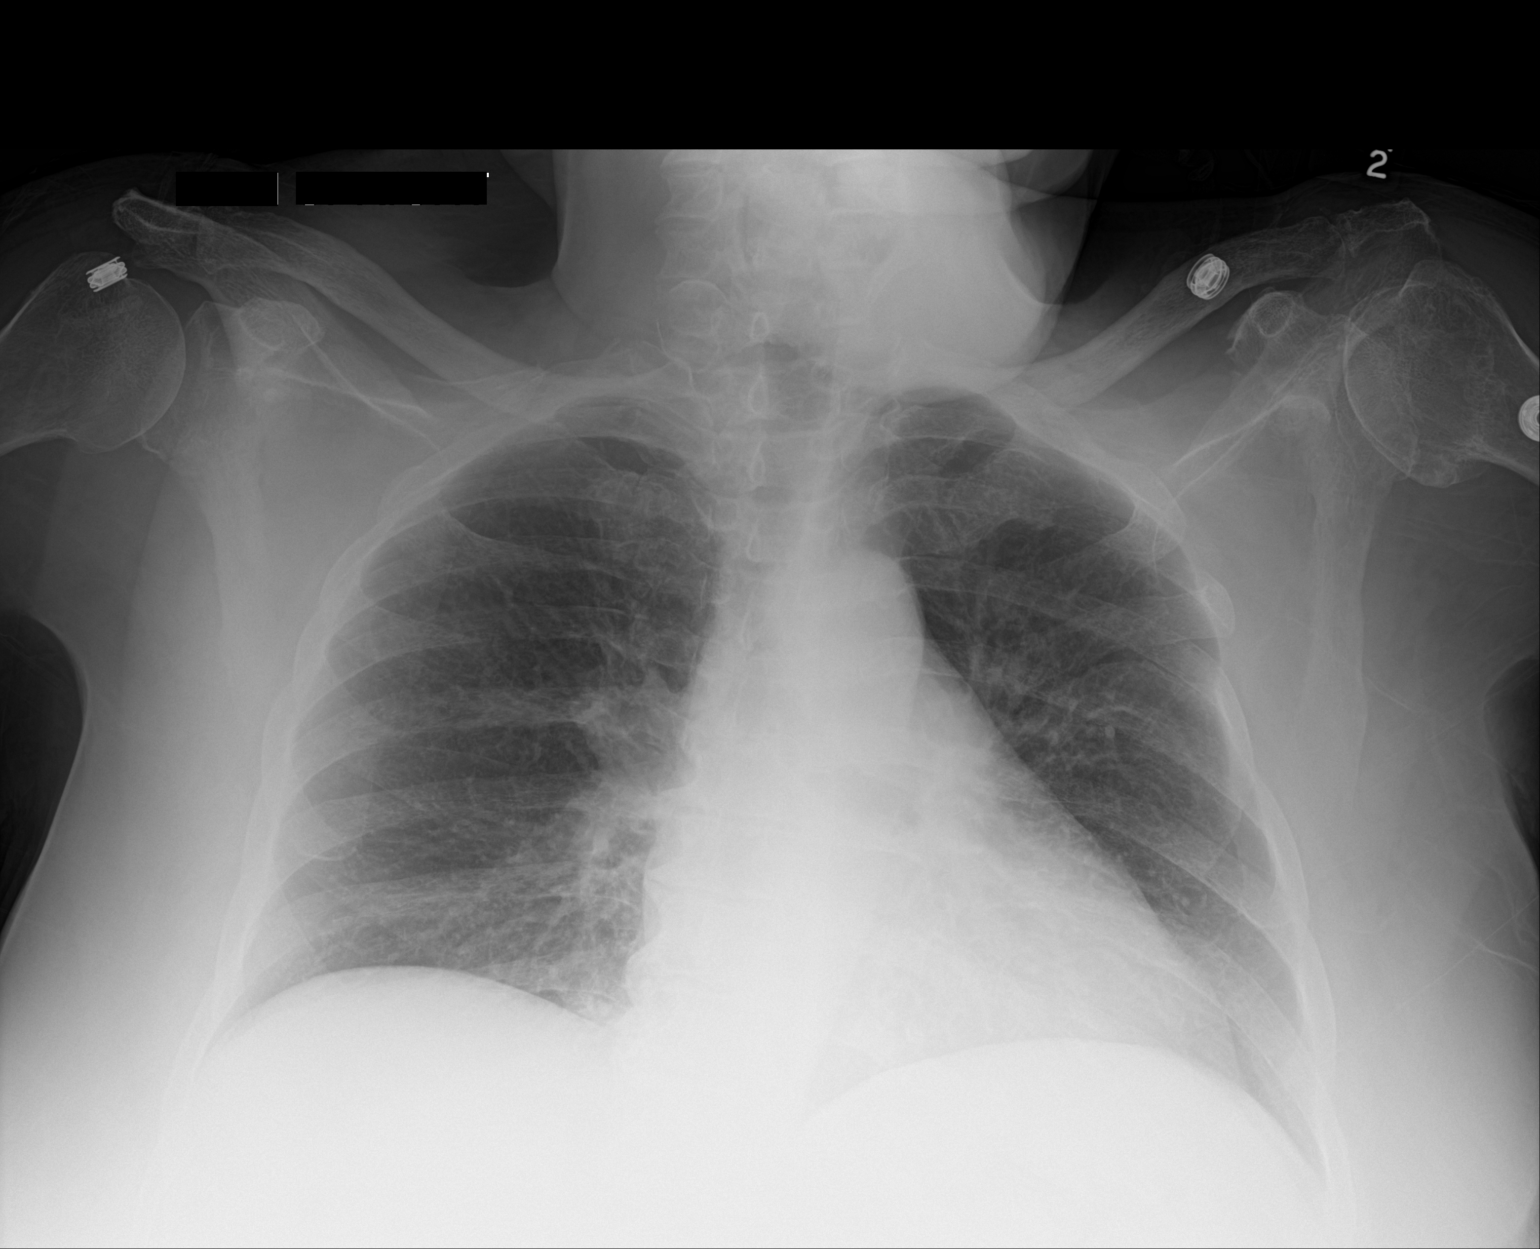

[2 of 2 positions shown; findings below may reference images not displayed]

FINDINGS: The heart is borderline and enlarged. Mild tortuosity of the
thoracic aorta. Low lung volumes with mild vascular crowding and
streaky basilar atelectasis. There also bronchitic changes which
could be acute or chronic. No infiltrates or effusions. The bony
thorax is intact.
IMPRESSION: Acute versus chronic bronchitic change.  No infiltrates or effusions

## 2017-09-12 IMAGING — RF DG SWALLOWING FUNCTION - NRPT [facility]
1 series · 18 of 24 positions shown · non-contrast
Comparison: none

[Series 1: run · 17 acquisitions, 18 frames shown]
[im 1/17]
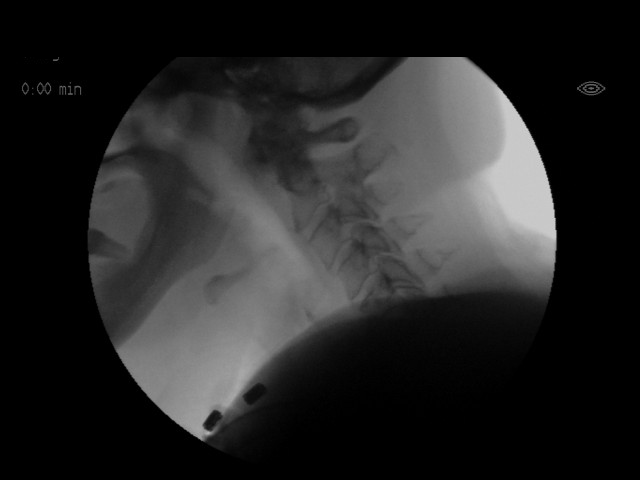
[im 2/17]
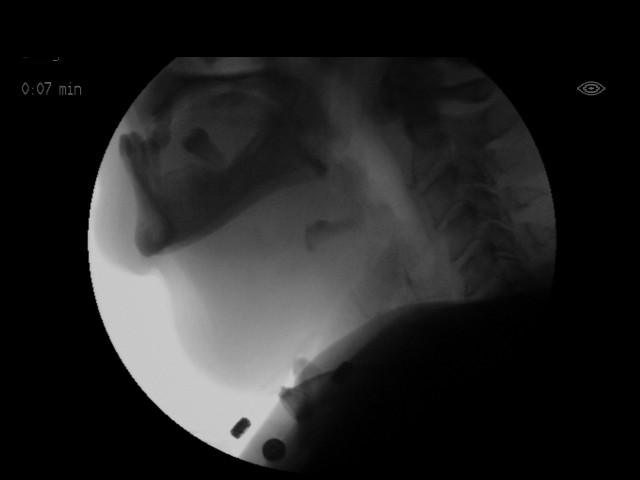
[im 3/17]
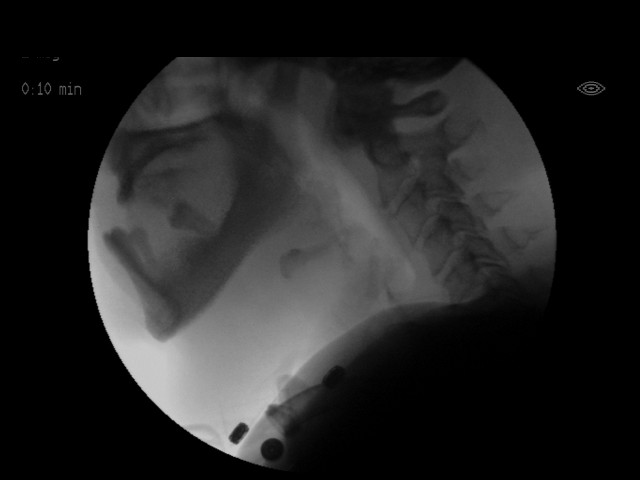
[im 4/17]
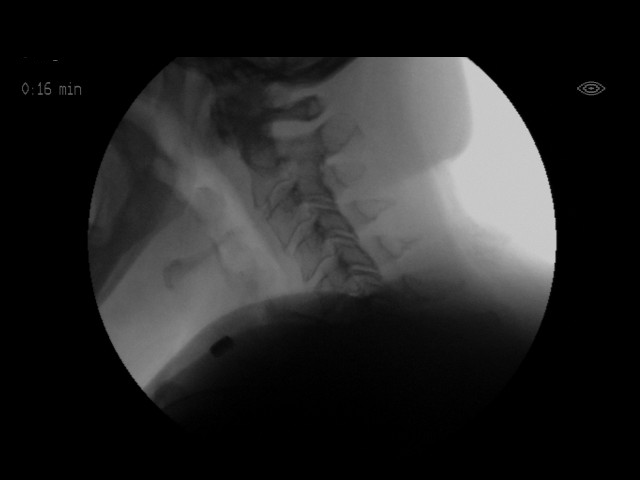
[im 5/17]
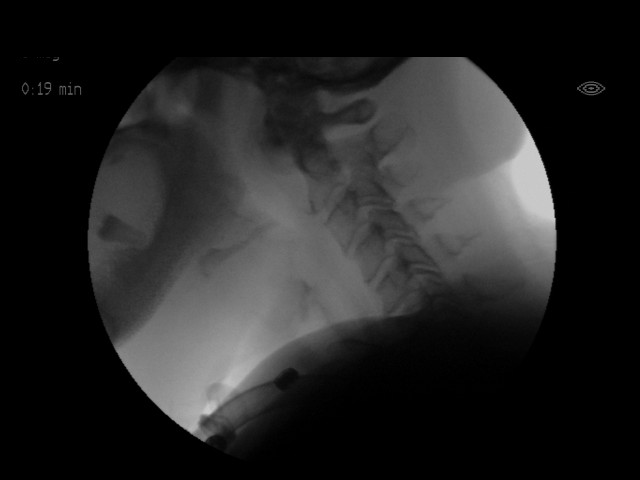
[im 6/17]
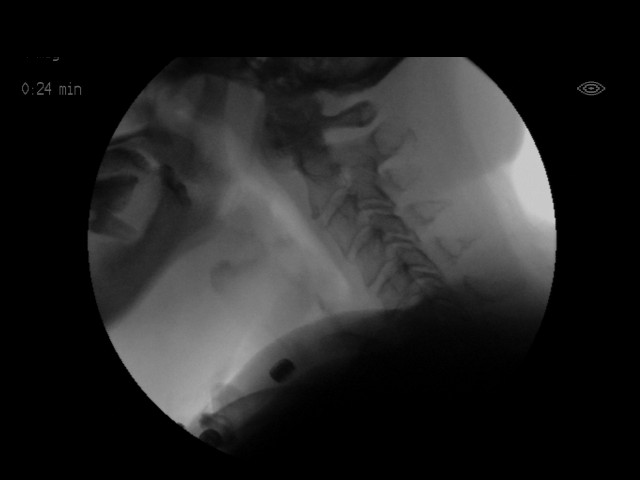
[im 6/17]
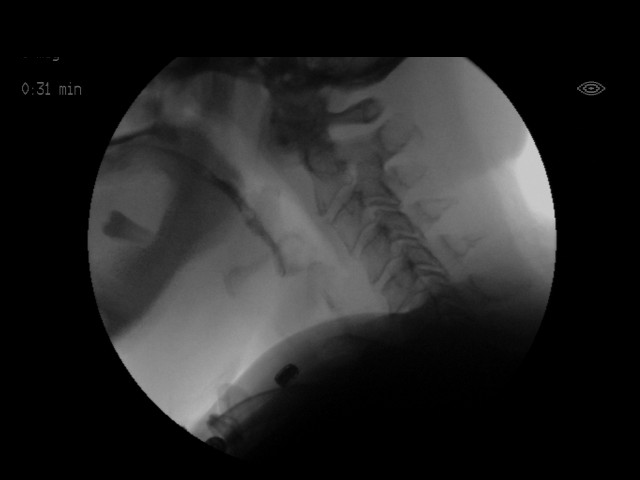
[im 8/17]
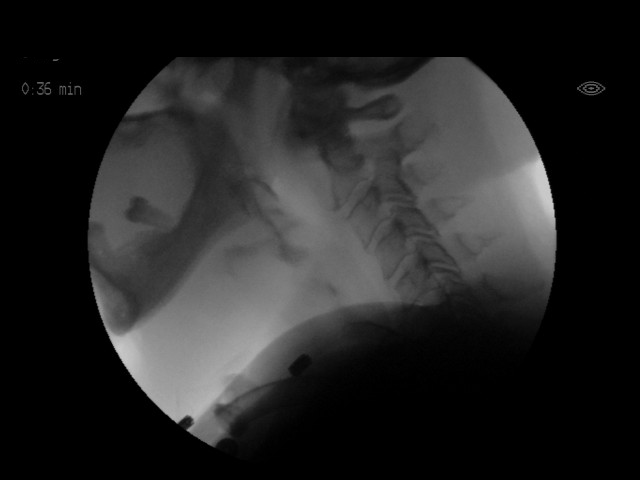
[im 9/17]
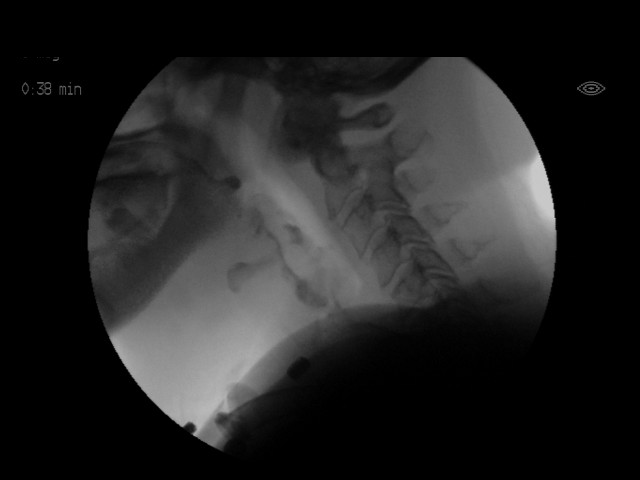
[im 9/17]
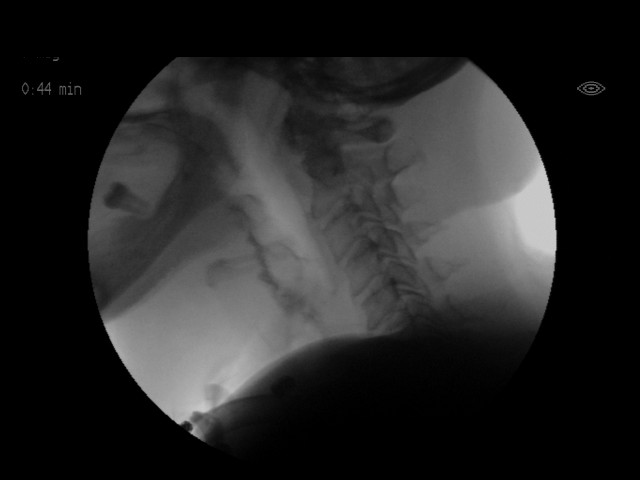
[im 11/17]
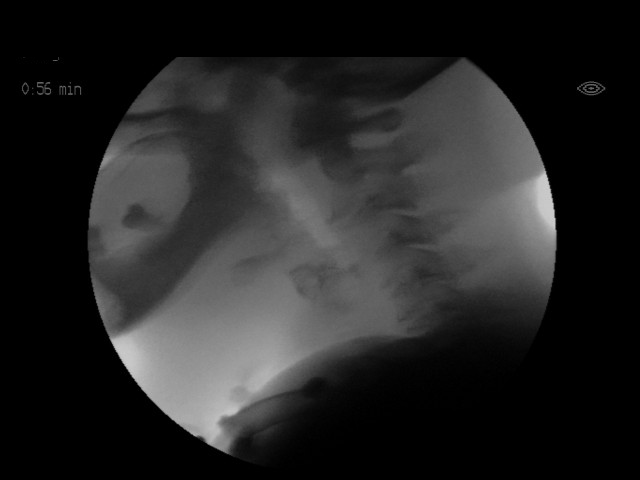
[im 12/17]
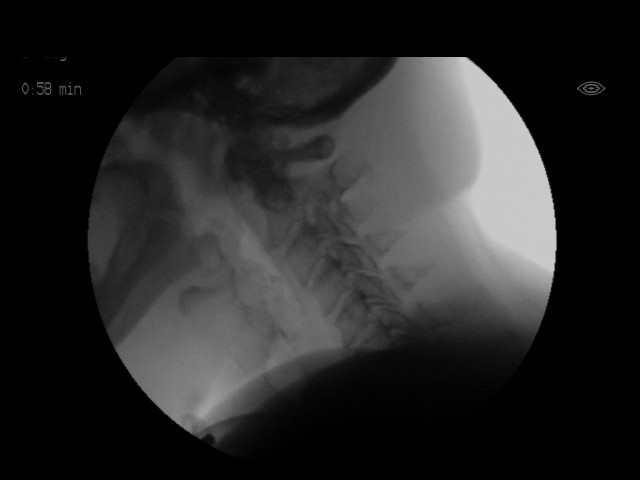
[im 12/17]
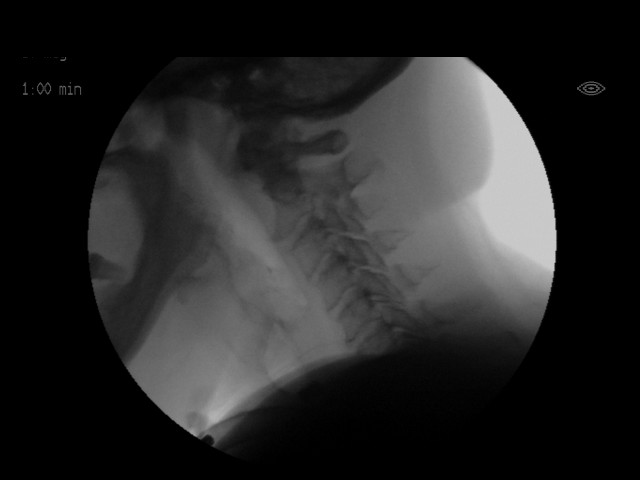
[im 14/17]
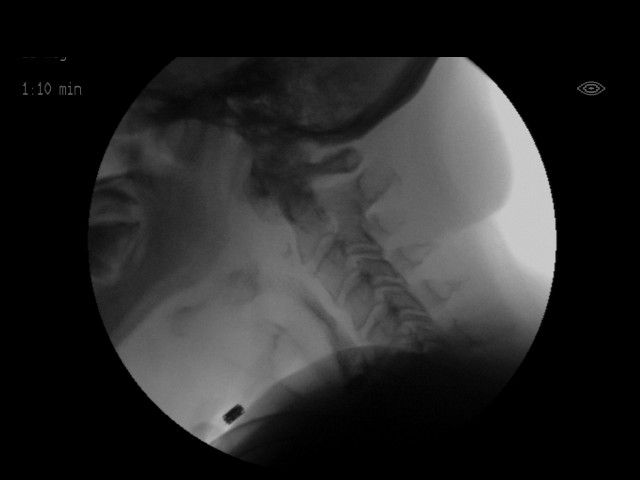
[im 14/17]
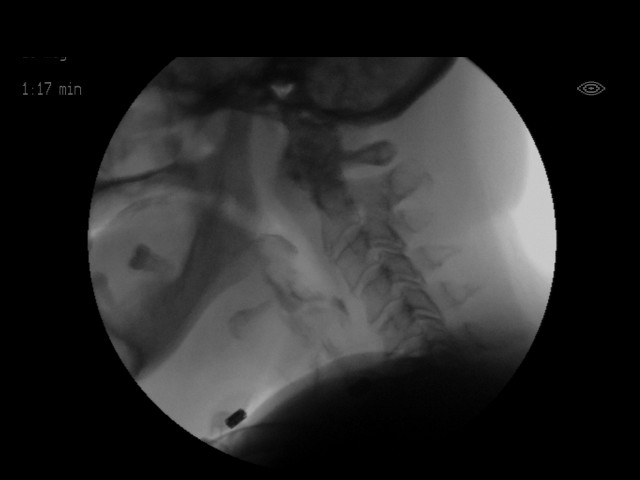
[im 15/17]
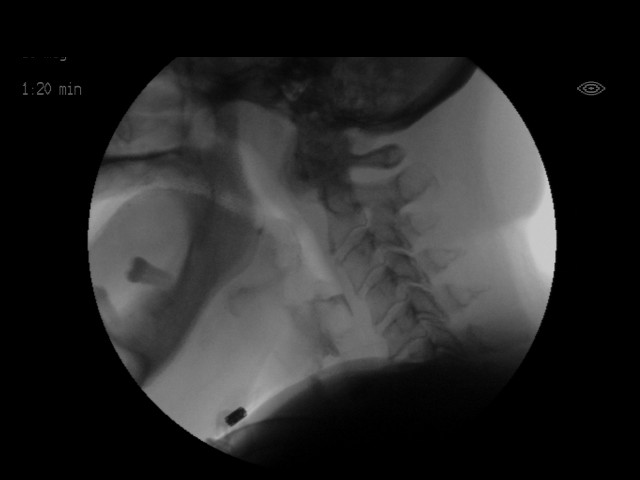
[im 17/17]
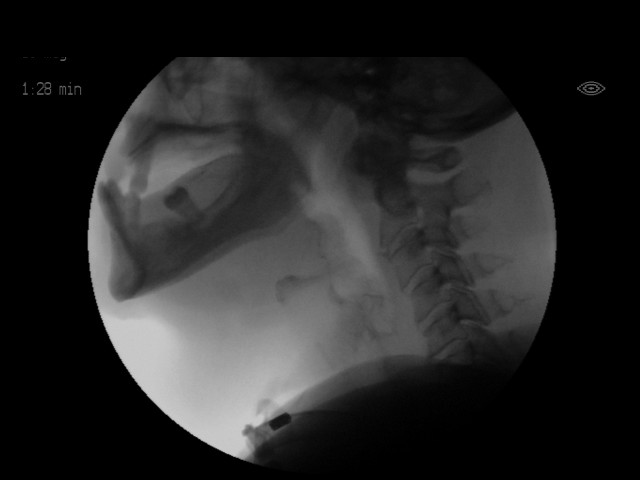
[im 17/17]
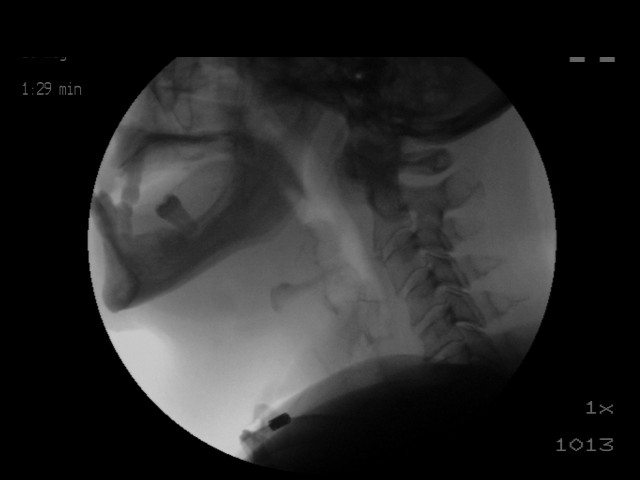

[18 of 24 positions shown; findings below may reference images not displayed]

FLUOROSCOPY FOR SWALLOWING FUNCTION STUDY:
Fluoroscopy was provided for swallowing function study, which was administered by a speech pathologist.  Final results and recommendations from this study are contained within the speech pathology report.

## 2017-09-18 IMAGING — CR DG CHEST 1V PORT
1 series · 1 of 1 positions shown · non-contrast
Comparison: Frontal and lateral views 2 days prior 09/12/2015

CLINICAL DATA: Shortness of breath tonight.

EXAM:
PORTABLE CHEST 1 VIEW

[AP]
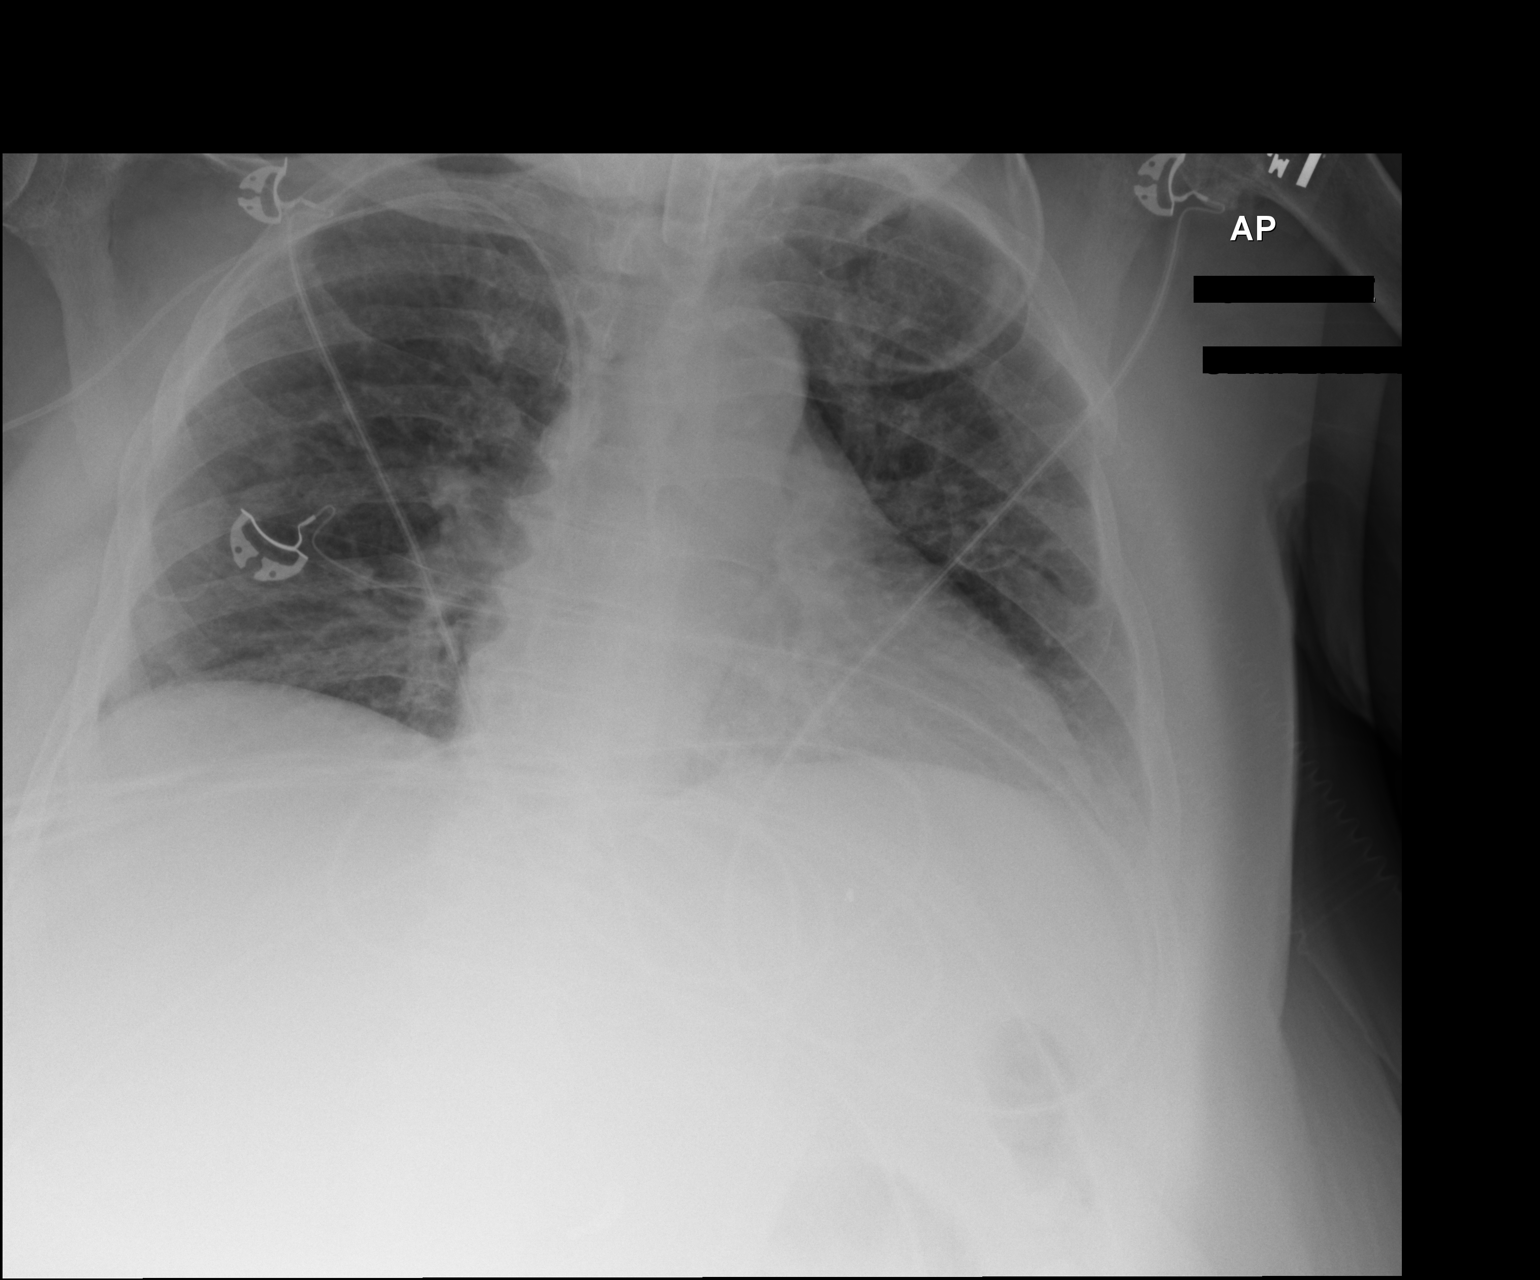

[1 of 1 positions shown; findings below may reference images not displayed]

FINDINGS: Tracheostomy tube at the thoracic inlet. Tip of the right upper
extremity PICC in the mid-distal SVC. Cardiomediastinal contours are
unchanged. Mild bibasilar atelectasis. No consolidation to suggest
pneumonia. No large pleural effusion or pneumothorax.
IMPRESSION: 1. Tracheostomy tube in right upper extremity PICC in place.
2. Mild bibasilar atelectasis.

## 2017-09-29 IMAGING — CR DG CHEST 1V PORT
1 series · 1 of 1 positions shown · non-contrast
Comparison: 09/23/2015 dating back to 08/29/2015.

CLINICAL DATA: 59-year-old with recent diagnosis of laryngeal
cancer with indwelling tracheostomy, presenting 2 days ago with
sepsis. Worsening hypoxia acutely today.

EXAM:
PORTABLE CHEST 1 VIEW

[AP]
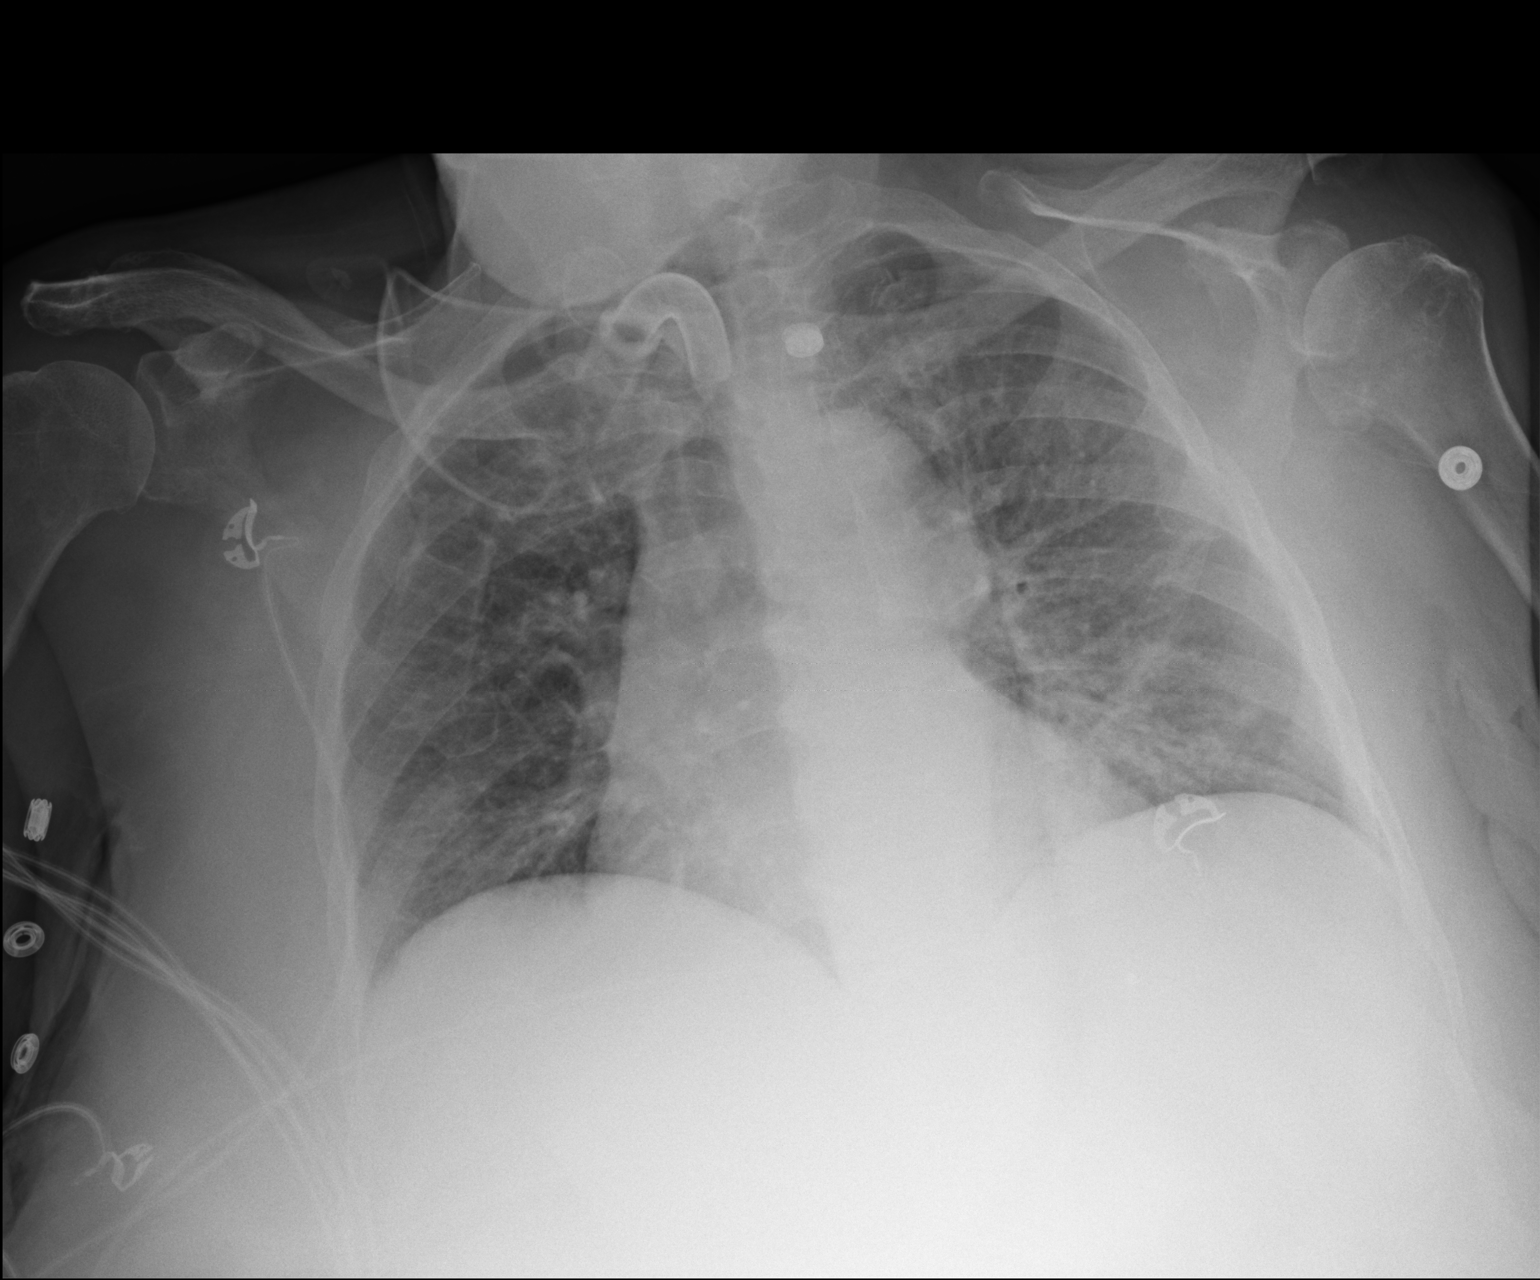

[1 of 1 positions shown; findings below may reference images not displayed]

FINDINGS: Tracheostomy tube in satisfactory position below the thoracic inlet.
Markedly suboptimal inspiration with atelectasis in the lung bases.
Cardiac silhouette mildly enlarged even allowing for technique and
degree of inspiration. Pulmonary venous hypertension and minimal to
mild interstitial pulmonary edema, new since 2 days ago. Streaky and
patchy opacity at the left lung base. No confluent consolidation
elsewhere. Note is made of slight inferior subluxation of the left
humeral head relative to the glenoid, not seen on prior
examinations, though moderate to severe degenerative changes are
present in the left glenohumeral joint.
IMPRESSION: 1. Markedly suboptimal inspiration with bibasilar atelectasis.
Possible developing bronchopneumonia at the left lung base.
2. Stable cardiomegaly. Pulmonary venous hypertension with minimal
to mild interstitial pulmonary edema, query fluid overload.
3. Slight inferior subluxation of the left glenohumeral joint,
associated with moderate to severe degenerative changes.
